# Patient Record
Sex: Male | Born: 1955 | Race: White | Hispanic: No | Marital: Married | Smoking: Never smoker
Health system: Southern US, Community
[De-identification: ages and names within clinical notes are randomized; demographics above are authoritative.]

## PROBLEM LIST (undated history)

## (undated) DIAGNOSIS — I499 Cardiac arrhythmia, unspecified: Secondary | ICD-10-CM

## (undated) DIAGNOSIS — E669 Obesity, unspecified: Secondary | ICD-10-CM

## (undated) DIAGNOSIS — I5022 Chronic systolic (congestive) heart failure: Secondary | ICD-10-CM

## (undated) DIAGNOSIS — Z86718 Personal history of other venous thrombosis and embolism: Secondary | ICD-10-CM

## (undated) DIAGNOSIS — E538 Deficiency of other specified B group vitamins: Secondary | ICD-10-CM

## (undated) DIAGNOSIS — I480 Paroxysmal atrial fibrillation: Secondary | ICD-10-CM

## (undated) DIAGNOSIS — T8859XA Other complications of anesthesia, initial encounter: Secondary | ICD-10-CM

## (undated) DIAGNOSIS — I10 Essential (primary) hypertension: Secondary | ICD-10-CM

## (undated) DIAGNOSIS — G4733 Obstructive sleep apnea (adult) (pediatric): Secondary | ICD-10-CM

## (undated) DIAGNOSIS — Z9289 Personal history of other medical treatment: Secondary | ICD-10-CM

## (undated) DIAGNOSIS — M1990 Unspecified osteoarthritis, unspecified site: Secondary | ICD-10-CM

## (undated) DIAGNOSIS — E039 Hypothyroidism, unspecified: Secondary | ICD-10-CM

## (undated) DIAGNOSIS — R7303 Prediabetes: Secondary | ICD-10-CM

## (undated) DIAGNOSIS — G629 Polyneuropathy, unspecified: Secondary | ICD-10-CM

## (undated) HISTORY — DX: Obstructive sleep apnea (adult) (pediatric): G47.33

## (undated) HISTORY — DX: Personal history of other medical treatment: Z92.89

## (undated) HISTORY — DX: Personal history of other venous thrombosis and embolism: Z86.718

## (undated) HISTORY — DX: Paroxysmal atrial fibrillation: I48.0

## (undated) HISTORY — DX: Unspecified osteoarthritis, unspecified site: M19.90

## (undated) HISTORY — DX: Essential (primary) hypertension: I10

## (undated) HISTORY — DX: Obesity, unspecified: E66.9

## (undated) HISTORY — PX: GANGLION CYST EXCISION: SHX1691

## (undated) HISTORY — PX: BARIATRIC SURGERY: SHX1103

## (undated) HISTORY — PX: TONSILLECTOMY: SUR1361

## (undated) HISTORY — DX: Deficiency of other specified B group vitamins: E53.8

## (undated) HISTORY — DX: Chronic systolic (congestive) heart failure: I50.22

---

## 1980-10-11 HISTORY — PX: CHOLECYSTECTOMY: SHX55

## 1991-10-12 HISTORY — PX: ARTHROSCOPIC REPAIR ACL: SUR80

## 2002-10-11 DIAGNOSIS — Z86718 Personal history of other venous thrombosis and embolism: Secondary | ICD-10-CM

## 2002-10-11 HISTORY — DX: Personal history of other venous thrombosis and embolism: Z86.718

## 2002-10-11 HISTORY — PX: BARIATRIC SURGERY: SHX1103

## 2009-10-11 HISTORY — PX: MENISCUS DEBRIDEMENT: SHX5178

## 2012-05-30 ENCOUNTER — Inpatient Hospital Stay: Payer: Self-pay | Admitting: Student

## 2012-05-30 LAB — URINALYSIS, COMPLETE
Bacteria: NONE SEEN
Bilirubin,UR: NEGATIVE
Blood: NEGATIVE
Glucose,UR: NEGATIVE mg/dL (ref 0–75)
Ketone: NEGATIVE
Ph: 5 (ref 4.5–8.0)
Specific Gravity: 1.015 (ref 1.003–1.030)
Squamous Epithelial: <1

## 2012-05-30 LAB — COMPREHENSIVE METABOLIC PANEL
Albumin: 4 g/dL (ref 3.4–5.0)
Anion Gap: 5 — ABNORMAL LOW (ref 7–16)
Bilirubin,Total: 0.3 mg/dL (ref 0.2–1.0)
EGFR (Non-African Amer.): >60
Glucose: 106 mg/dL — ABNORMAL HIGH (ref 65–99)
Osmolality: 285 (ref 275–301)
Potassium: 4.1 mmol/L (ref 3.5–5.1)
Sodium: 141 mmol/L (ref 136–145)
Total Protein: 8.2 g/dL (ref 6.4–8.2)

## 2012-05-30 LAB — CBC
HCT: 49.3 % (ref 40.0–52.0)
MCHC: 33.6 g/dL (ref 32.0–36.0)
MCV: 92 fL (ref 80–100)
Platelet: 171 10*3/uL (ref 150–440)
RBC: 5.38 10*6/uL (ref 4.40–5.90)
RDW: 14 % (ref 11.5–14.5)
WBC: 9.9 10*3/uL (ref 3.8–10.6)

## 2012-05-30 LAB — TROPONIN I: Troponin-I: <0.02 ng/mL

## 2012-05-30 LAB — APTT: Activated PTT: 29.7 secs (ref 23.6–35.9)

## 2012-05-30 LAB — TSH: Thyroid Stimulating Horm: 1.72 u[IU]/mL

## 2012-05-30 LAB — PROTIME-INR
INR: 1.1
Prothrombin Time: 14.1 secs (ref 11.5–14.7)

## 2012-05-31 DIAGNOSIS — R079 Chest pain, unspecified: Secondary | ICD-10-CM

## 2012-05-31 DIAGNOSIS — R002 Palpitations: Secondary | ICD-10-CM

## 2012-05-31 LAB — CK-MB
CK-MB: 1.1 ng/mL (ref 0.5–3.6)
CK-MB: 1.1 ng/mL (ref 0.5–3.6)

## 2012-05-31 LAB — BASIC METABOLIC PANEL
Anion Gap: 9 (ref 7–16)
BUN: 19 mg/dL — ABNORMAL HIGH (ref 7–18)
Chloride: 107 mmol/L (ref 98–107)
Creatinine: 1.07 mg/dL (ref 0.60–1.30)
EGFR (African American): >60
EGFR (Non-African Amer.): >60
Glucose: 98 mg/dL (ref 65–99)
Sodium: 143 mmol/L (ref 136–145)

## 2012-05-31 LAB — CBC WITH DIFFERENTIAL/PLATELET
Basophil %: 0.4 %
Eosinophil #: 0.2 10*3/uL (ref 0.0–0.7)
Eosinophil %: 1.9 %
HCT: 44.3 % (ref 40.0–52.0)
HGB: 15 g/dL (ref 13.0–18.0)
Lymphocyte %: 29.6 %
MCHC: 33.8 g/dL (ref 32.0–36.0)
MCV: 92 fL (ref 80–100)
Neutrophil #: 4.8 10*3/uL (ref 1.4–6.5)
Neutrophil %: 57 %
RBC: 4.83 10*6/uL (ref 4.40–5.90)
WBC: 8.5 10*3/uL (ref 3.8–10.6)

## 2012-05-31 LAB — TROPONIN I
Troponin-I: <0.02 ng/mL
Troponin-I: <0.02 ng/mL

## 2012-06-14 ENCOUNTER — Encounter: Payer: Self-pay | Admitting: Cardiovascular Disease

## 2012-06-19 ENCOUNTER — Ambulatory Visit (INDEPENDENT_AMBULATORY_CARE_PROVIDER_SITE_OTHER): Payer: 59 | Admitting: Cardiovascular Disease

## 2012-06-19 ENCOUNTER — Encounter: Payer: Self-pay | Admitting: Cardiovascular Disease

## 2012-06-19 VITALS — BP 172/82 | HR 66 | Ht 75.0 in | Wt 367.5 lb

## 2012-06-19 DIAGNOSIS — R0602 Shortness of breath: Secondary | ICD-10-CM

## 2012-06-19 DIAGNOSIS — I4891 Unspecified atrial fibrillation: Secondary | ICD-10-CM

## 2012-06-19 DIAGNOSIS — R002 Palpitations: Secondary | ICD-10-CM | POA: Insufficient documentation

## 2012-06-19 DIAGNOSIS — I10 Essential (primary) hypertension: Secondary | ICD-10-CM | POA: Insufficient documentation

## 2012-06-19 DIAGNOSIS — R0601 Orthopnea: Secondary | ICD-10-CM | POA: Insufficient documentation

## 2012-06-19 DIAGNOSIS — I482 Chronic atrial fibrillation, unspecified: Secondary | ICD-10-CM | POA: Insufficient documentation

## 2012-06-19 DIAGNOSIS — R079 Chest pain, unspecified: Secondary | ICD-10-CM

## 2012-06-19 MED ORDER — DILTIAZEM HCL ER 240 MG PO CP24: 240.0000 mg | ORAL_CAPSULE | Freq: Every day | ORAL | Status: DC

## 2012-06-19 NOTE — Assessment & Plan Note (Signed)
He has symptoms of shortness of breath. Uncertain if this is from arrhythmia or other etiology. The monitor should help determine this.

## 2012-06-19 NOTE — Patient Instructions (Addendum)
Monitor blood pressure  We will order a 30 day monitor for atrial fibrillation  Do a trial off xarelto for a few days  Please call us if you have new issues that need to be addressed before your next appt.  Your physician wants you to follow-up in: 6 weeks  You will receive a reminder letter in the mail two months in advance. If you don't receive a letter, please call our office to schedule the follow-up appointment.

## 2012-06-19 NOTE — Assessment & Plan Note (Signed)
Symptoms concerning for paroxysmal atrial fibrillation. He has irregular measurements on his blood pressure cuff with feelings of malaise and palpitations. We have ordered a 30 day monitor to help [place] with medication titration for his atrial fibrillation. This should arrive in the mail in one to 2 days.

## 2012-06-19 NOTE — Progress Notes (Signed)
Patient ID: Johnathan [name], male    DOB: 26-Apr-1956, 56 y.o.   MRN: [id number]  HPI Comments: [name] is a pleasant 56 year old gentleman with obesity, chronic joint pain on long-term low-dose narcotics, hypertension, history of bariatric surgery who is brought in by EMS to the hospital 05/31/2012 with left-sided chest pain and palpitations. He was given adenosine in the emergency room that showed atrial fibrillation. He received Cardizem and started on diltiazem infusion and converted to normal sinus rhythm overnight. He was started on anticoagulation, continued on long-acting Cardizem and was discharged after echocardiogram showed normal LV function.  Echocardiogram showed normal LV function with ejection fraction 50-55%, normal right ventricular systolic pressure, normal left atrium Chest x-ray was normal No significant decline in his cardiac enzymes, TSH 1.7  Since his discharge, he has vague symptoms of malaise, occasional palpitations, but he cleared his received abnormal readings on his blood pressure cuff concerning for arrhythmia. His wife reports that he looked gray at times. He has significant heat intolerance since he was started on these medications and finds himself sweating when it is not hot.  Blood pressure at home has been labile, typically in the 130-140 systolic range.   EKG today shows normal sinus rhythm with rate 66 beats per minute, no significant ST or T wave changes   Outpatient Encounter Prescriptions as of 06/19/2012  Medication Sig Dispense Refill  . aspirin 81 MG tablet Take 81 mg by mouth daily.      . B Complex Vitamins (B COMPLEX 50 PO) Take by mouth daily.      Marland Kitchen buPROPion (WELLBUTRIN XL) 300 MG 24 hr tablet Take 300 mg by mouth daily.      . Cholecalciferol (VITAMIN D3) 2000 UNITS TABS Take by mouth daily.      Marland Kitchen diltiazem (DILACOR XR) 240 MG 24 hr capsule Take 1 capsule (240 mg total) by mouth daily.  90 capsule  3  . FERROUS GLUCONATE PO Take by mouth  daily.      Marland Kitchen GLUCOSAMINE HCL-MSM PO Take by mouth daily.      Marland Kitchen HYDROcodone-acetaminophen (VICODIN) 5-500 MG per tablet Take 1 tablet by mouth as needed.      Marland Kitchen lisinopril-hydrochlorothiazide (PRINZIDE,ZESTORETIC) 20-12.5 MG per tablet Take 1 tablet by mouth daily.      . meloxicam (MOBIC) 15 MG tablet Take 15 mg by mouth daily.      . Multiple Vitamin (MULTIVITAMIN) tablet Take 1 tablet by mouth daily.      . Rivaroxaban (XARELTO) 20 MG TABS Take 20 mg by mouth daily.      . SAW PALMETTO-PHYTOSTEROLS PO Take 320 mg by mouth daily.      . vitamin B-12 (CYANOCOBALAMIN) 500 MCG tablet Take 500 mcg by mouth daily.         Review of Systems  Constitutional: Positive for fatigue.       Fells hot and sweaty  HENT: Negative.   Eyes: Negative.   Respiratory: Negative.   Cardiovascular: Positive for palpitations.  Gastrointestinal: Negative.   Musculoskeletal: Negative.   Skin: Negative.   Neurological: Positive for weakness.  Hematological: Negative.   Psychiatric/Behavioral: Negative.   All other systems reviewed and are negative.    BP 172/82  Pulse 66  Ht 6\' 3"  (1.905 m)  Wt 367 lb 8 oz (166.697 kg)  BMI 45.93 kg/m2  Physical Exam  Nursing note and vitals reviewed. Constitutional: He is oriented to person, place, and time. He appears well-developed and well-nourished.  obese  HENT:  Head: Normocephalic.  Nose: Nose normal.  Mouth/Throat: Oropharynx is clear and moist.  Eyes: Conjunctivae are normal. Pupils are equal, round, and reactive to light.  Neck: Normal range of motion. Neck supple. No JVD present.  Cardiovascular: Normal rate, regular rhythm, S1 normal, S2 normal, normal heart sounds and intact distal pulses.  Exam reveals no gallop and no friction rub.   No murmur heard. Pulmonary/Chest: Effort normal and breath sounds normal. No respiratory distress. He has no wheezes. He has no rales. He exhibits no tenderness.  Abdominal: Soft. Bowel sounds are normal. He  exhibits no distension. There is no tenderness.  Musculoskeletal: Normal range of motion. He exhibits no edema and no tenderness.  Lymphadenopathy:    He has no cervical adenopathy.  Neurological: He is alert and oriented to person, place, and time. Coordination normal.  Skin: Skin is warm and dry. No rash noted. No erythema.  Psychiatric: He has a normal mood and affect. His behavior is normal. Judgment and thought content normal.           Assessment and Plan        The

## 2012-06-19 NOTE — Assessment & Plan Note (Signed)
Uncertain if palpitation symptoms are from atrial fibrillation or ectopy. Monitor pending.

## 2012-06-19 NOTE — Assessment & Plan Note (Signed)
Blood pressure is elevated. We will ask them to closely monitor his blood pressure at home and write this down for [place]

## 2012-06-26 DIAGNOSIS — I4949 Other premature depolarization: Secondary | ICD-10-CM

## 2012-06-26 DIAGNOSIS — I4891 Unspecified atrial fibrillation: Secondary | ICD-10-CM

## 2012-06-27 ENCOUNTER — Telehealth: Payer: Self-pay | Admitting: Cardiovascular Disease

## 2012-06-27 NOTE — Telephone Encounter (Signed)
See below and advise

## 2012-06-27 NOTE — Telephone Encounter (Signed)
Pt calling states that he was told to stop taking the xarelto and the symptoms that he was having have stopped. Needs to know if he needs to double his ASA or is there something else he needs to take

## 2012-06-27 NOTE — Telephone Encounter (Signed)
Can we look on line for the results of his 30 day monitor. He should be wearing this now. We need to look for atrial fibrillation. If he is maintaining normal sinus rhythm, perhaps we can hold the anticoagulation.

## 2012-06-28 NOTE — Telephone Encounter (Signed)
He has only been wearing x 3 days, per pt. It looks like he had 1 day of atrial fib 9/17. On your desk.

## 2012-06-29 NOTE — Telephone Encounter (Signed)
We will need to watch his rhythm closely. Hold anticoagulation for now Would set up followup after his 30 day monitor

## 2012-06-30 NOTE — Telephone Encounter (Signed)
Pt called back.  Understanding verb

## 2012-06-30 NOTE — Telephone Encounter (Signed)
lmtcb

## 2012-07-10 ENCOUNTER — Encounter: Payer: Self-pay | Admitting: Internal Medicine

## 2012-07-10 ENCOUNTER — Ambulatory Visit (INDEPENDENT_AMBULATORY_CARE_PROVIDER_SITE_OTHER): Payer: 59 | Admitting: Internal Medicine

## 2012-07-10 VITALS — BP 146/74 | HR 69 | Temp 99.1°F | Ht 72.25 in | Wt 364.0 lb

## 2012-07-10 DIAGNOSIS — R5383 Other fatigue: Secondary | ICD-10-CM | POA: Insufficient documentation

## 2012-07-10 DIAGNOSIS — Z23 Encounter for immunization: Secondary | ICD-10-CM

## 2012-07-10 DIAGNOSIS — R5381 Other malaise: Secondary | ICD-10-CM | POA: Insufficient documentation

## 2012-07-10 DIAGNOSIS — Z1211 Encounter for screening for malignant neoplasm of colon: Secondary | ICD-10-CM

## 2012-07-10 DIAGNOSIS — M2550 Pain in unspecified joint: Secondary | ICD-10-CM

## 2012-07-10 DIAGNOSIS — M159 Polyosteoarthritis, unspecified: Secondary | ICD-10-CM | POA: Insufficient documentation

## 2012-07-10 MED ORDER — BUPROPION HCL ER (XL) 300 MG PO TB24: 300.0000 mg | ORAL_TABLET | Freq: Every day | ORAL | Status: DC

## 2012-07-10 MED ORDER — HYDROCODONE-ACETAMINOPHEN 5-500 MG PO TABS: 1.0000 | ORAL_TABLET | ORAL | Status: DC | PRN

## 2012-07-10 NOTE — Patient Instructions (Addendum)
Return for fasting labs, including PSA and B12, iron, K, etc.  Follow up with me in 1 month  Referral to [facility] underway for bilateral shoulder pain and knee pain  Referral to [facility] for screening colonoscopy underway   This is  Dr. Norton Blizzard version of a  "Low GI"  Diet:  All of the foods can be found at grocery stores and in bulk at [employer].  The Atkins protein bars and shakes are available in more varieties at [employer], [employer] and [employer].     7 AM Breakfast:  Low carbohydrate Protein  Shakes (I recommend the EAS AdvantEdge "Carb Control" shakes  Or the low carb shakes by Atkins.   Both are available everywhere:  In  cases at [employer]Js  Or in 4 packs at grocery stores and pharmacies  2.5 carbs  (Alternative is  a toasted Arnold's Sandwhich Thin w/ peanut butter, a "Bagel Thin" with cream cheese and salmon) or  a scrambled egg burrito made with a low carb tortilla .  Avoid cereal and bananas, oatmeal too unless you are cooking the old fashioned kind that takes 30-40 minutes to prepare.  the rest is overly processed, has minimal fiber, and is loaded with carbohydrates!   10 AM: Protein bar by Atkins (the snack size, under 200 cal).  There are many varieties , available widely again or in bulk in limited varieties at BJs)  Other so called "protein bars" tend to be loaded with carbohydrates.  Remember, in food advertising, the word "energy" is synonymous for " carbohydrate."  Lunch: sandwich of [place], (or any lunchmeat, grilled meat or canned tuna), fresh avocado, mayonnaise  and cheese on a lower carbohydrate pita bread, flatbread, or tortilla . Ok to use regular mayonnaise. The bread is the only source or carbohydrate that can be decreased (Joseph's makes a pita bread and a flat bread that are 50 cal and 4 net carbs ; Toufayan makes a low carb flatbread that's 100 cal and 9 net carbs  and  Mission makes a low carb whole wheat tortilla  That is 210 cal and 6 net carbs)  3  PM:  Mid day :  Another protein bar,  Or a  cheese stick (100 cal, 0 carbs),  Or 1 ounce of  almonds, walnuts, pistachios, pecans, peanuts,  Macadamia nuts. Or a Dannon light n Fit greek yogurt, 80 cal 8 net carbs . Avoid "granola"; the dried cranberries and raisins are loaded with carbohydrates. Mixed nuts ok if no raisins or cranberries or dried fruit.      6 PM  Dinner:  "mean and green:"  Meat/chicken/fish or a high protein legume; , with a green salad, and a low GI  Veggie (broccoli, cauliflower, green beans, spinach, brussel sprouts. Lima beans) : Avoid "Low fat dressings, as well as Reyne Dumas and [address]! They are loaded with sugar! Instead use ranch, vinagrette,  Blue cheese, etc  9 PM snack : Breyer's "low carb" fudgsicle or  ice cream bar (Carb Smart line), or  Weight Watcher's ice cream bar , or another "no sugar added" ice cream;a serving of fresh berries/cherries with whipped cream (Avoid bananas, pineapple, grapes  and watermelon on a regular basis because they are high in sugar)   Remember that snack Substitutions should be less than 15 to 20 carbs  Per serving. Remember to subtract fiber grams and sugar alcohols to get the "net carbs."

## 2012-07-10 NOTE — Assessment & Plan Note (Signed)
His BMI is 49 despite going to gastric bypass surgery 10 years ago. I've given him a low glycemic index diet outlined. He may need to be referred back to a local bariatric Center for support.  His ability to exercise is hindered by his belt has multiple joint pains. He is exercising at the [facility] using water aerobics and water walking in the water. I've encouraged him to continue this a minimum of 5 days a week. I will see him back in one month. I will screen for diabetes.

## 2012-07-10 NOTE — Progress Notes (Signed)
Patient ID: Johnathan [name], male   DOB: 10/27/1955, 56 y.o.   MRN: [id number]  Patient Active Problem List  Diagnosis  . A-fib  . Palpitations  . Shortness of breath  . Essential hypertension, malignant  . Other malaise and fatigue  . Obesity, Class III, BMI 40-49.9 (morbid obesity)  . 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 Establish Care    HPI:   Johnathan [name] is a 55 y.o. male who presents as a new patient to establish primary care with the chief complaint of  Extremely low energy for the past 3 months  .  Patient is a 56 year old white male with history of morbid obesity status post gastric bypass surgery remotely with recent onset of atrial fibrillation who is here to establish primary care.  His symptom of extreme fatigue Started after starting medications diltiazem and xarelto.  He spoke with Dr. Orpah Clinton who has agreed with suspension of 0 also until his followup on October 18. He has been tested for sleep apnea but this occurred 10 Roux-en-Y gastric bypass  in 1998 in [place] for morbid obesity. His weight was 450 pounds and he drops to 325 eventually but has regained 40 pounds since then. He has disruptive sleep and averages a total of 5-6 hours per night. He is woken up by frequent urination. He has no trouble staying awake during the day. He does snore. 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 He previously saw a rheumatologist in [place] and was also referred to Dr. Kathi Ludwig by his former PCP. Rheumatoid arthritis and other autoimmune symptoms were reportedly ruled out. He has a history of failed steroid injections in both shoulders. He has had arthroscopic surgery in both knees. He had a meniscal tear in the left knee and had cartilage removed 2 years ago. His pain is controlled with 2-3 Vicodin daily which he has been taking for  the last 10 years. Does not have an orthopedist here in [place] 3) glucose intolerance patient states that for the last several months he has developed GI distress with carbohydrate including muffins. He reports no personal history of diabetes but is becoming increasingly intolerant of refined carbs .  He has recurrent dumping syndrome after now after drinking milk and eating things like blueberry muffins. G .  Attributes his wt gain to several life changes including relocating from [place] to [facility] to [facility]. 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     Past Medical History  Diagnosis Date  . Hypertension   . B12 deficiency   . Arthritis   . Obesity   . Arrhythmia     A-fib  . History of DVT (deep vein thrombosis)     s/p bariatric surgery    Past Surgical History  Procedure Date  . Bariatric surgery   . Arthroscopic repair acl     bilateral knee  . Cholecystectomy     History reviewed. No pertinent family history.  History   Social History  . Marital Status: Married    Spouse Name: N/A    Number of Children: N/A  . Years of Education: N/A   Occupational History  . Not on file.   Social History Main Topics  . Smoking status: Never Smoker   . Smokeless  tobacco: Not on file  . Alcohol Use: No  . Drug Use: No  . Sexually Active:    Other Topics Concern  . Not on file   Social History Narrative  . No narrative on file     No Known Allergies  Review of Systems:   The remainder of the review of systems was negative except those addressed in the HPI.       Objective:  BP 146/74  Pulse 69  Temp 99.1 F (37.3 C) (Oral)  Ht 6' 0.25" (1.835 m)  Wt 364 lb (165.109 kg)  BMI 49.03 kg/m2  SpO2 95%  General appearance: alert, cooperative and appears stated age Ears: normal TM's and external ear canals both ears Throat: lips, mucosa, and tongue normal; teeth and gums normal Neck: no  adenopathy, no carotid bruit, supple, symmetrical, trachea midline and thyroid not enlarged, symmetric, no tenderness/mass/nodules Back: symmetric, no curvature. ROM normal. No CVA tenderness. Lungs: clear to auscultation bilaterally Heart: regular rate and rhythm, S1, S2 normal, no murmur, click, rub or gallop Abdomen: soft, non-tender; bowel sounds normal; no masses,  no organomegaly Pulses: 2+ and symmetric Skin: Skin color, texture, turgor normal. No rashes or lesions Lymph nodes: Cervical, supraclavicular, and axillary nodes normal.  Assessment and Plan:  Other malaise and fatigue The differential diagnosis is broad and not likely due to diltiazem. My suspicion is that he has either low testosterone, sleep apnea, diabetes or some combination of the 3. Hypothyroidism was ruled out with a TSH 3 weeks ago during ER visit for atrial fibrillation. He will return for fasting labs and testosterone level. If his levels are normal I will recommend strongly that he had a repeat sleep study.  Obesity, Class III, BMI 40-49.9 (morbid obesity) His BMI is 49 despite going to gastric bypass surgery 10 years ago. I've given him a low glycemic index diet outlined. He may need to be referred back to a local bariatric Center for support.  His ability to exercise is hindered by his belt has multiple joint pains. He is exercising at the [facility] using water aerobics and water walking in the water. I've encouraged him to continue this a minimum of 5 days a week. I will see him back in one month. I will screen for diabetes.  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 He has seen rheumatologists both in [place] in [place]. His arthritis is complicated by morbid obesity which is aggravating his knee pain. I will refill his Vicodin on a regular basis here. I have referred him to [facility] for evaluation  of both shoulders and knees anticipation of need for joint replacement. He will likely need to lose 10% of his body weight before the surgery would be recommended by an orthopedist.   Updated Medication List Outpatient Encounter Prescriptions as of 07/10/2012  Medication Sig Dispense Refill  . aspirin 81 MG tablet Take 81 mg by mouth daily.      . B Complex Vitamins (B COMPLEX 50 PO) Take by mouth daily.      Marland Kitchen buPROPion (WELLBUTRIN XL) 300 MG 24 hr tablet Take 1 tablet (300 mg total) by mouth daily.  90 tablet  3  . Cholecalciferol (VITAMIN D3) 2000 UNITS TABS Take by mouth daily.      Marland Kitchen diltiazem (DILACOR XR) 240 MG 24 hr capsule Take 1 capsule (240 mg total) by mouth daily.  90 capsule  3  . FERROUS GLUCONATE PO Take by mouth daily.      Marland Kitchen GLUCOSAMINE HCL-MSM PO Take by mouth daily.      Marland Kitchen HYDROcodone-acetaminophen (VICODIN) 5-500 MG per tablet Take 1 tablet by mouth as needed.  90 tablet  3  . lisinopril-hydrochlorothiazide (PRINZIDE,ZESTORETIC) 20-12.5 MG per tablet Take 1 tablet by mouth daily.      . meloxicam (MOBIC) 15 MG tablet Take 15 mg by mouth daily.      . Multiple Vitamin (MULTIVITAMIN) tablet Take 1 tablet by mouth daily.      . SAW PALMETTO-PHYTOSTEROLS PO Take 320 mg by mouth daily.      . vitamin B-12 (CYANOCOBALAMIN) 500 MCG tablet Take 500 mcg by mouth daily.      . vitamin E 400 UNIT capsule Take 400 Units by mouth daily.      Marland Kitchen DISCONTD: buPROPion (WELLBUTRIN XL) 300 MG 24 hr tablet Take 300 mg by mouth daily.      Marland Kitchen DISCONTD: HYDROcodone-acetaminophen (VICODIN) 5-500 MG per tablet Take 1 tablet by mouth as needed.      Marland Kitchen DISCONTD: Rivaroxaban (XARELTO) 20 MG TABS Take 20 mg by mouth daily.         Orders Placed This Encounter  Procedures  . Tdap vaccine greater than or equal to 7yo IM  . Ambulatory referral to Orthopedic Surgery  . Ambulatory referral to Gastroenterology    No Follow-up on file.

## 2012-07-10 NOTE — Assessment & Plan Note (Addendum)
The differential diagnosis is broad and not likely due to diltiazem. My suspicion is that he has either low testosterone, sleep apnea, diabetes or some combination of the 3. Hypothyroidism was ruled out with a TSH 3 weeks ago during ER visit for atrial fibrillation. He will return for fasting labs and testosterone level. If his levels are normal I will recommend strongly that he had a repeat sleep study.

## 2012-07-10 NOTE — Assessment & Plan Note (Signed)
He has reportedly had a rheumatologic evaluation to rule out autoimmune syndromes. He has seen rheumatologists both in [place] in [place]. His arthritis is complicated by morbid obesity which is aggravating his knee pain. I will refill his Vicodin on a regular basis here. I have referred him to [facility] for evaluation of both shoulders and knees anticipation of need for joint replacement. He will likely need to lose 10% of his body weight before the surgery would be recommended by an orthopedist.

## 2012-07-11 ENCOUNTER — Telehealth: Payer: Self-pay | Admitting: Internal Medicine

## 2012-07-11 ENCOUNTER — Encounter: Payer: Self-pay | Admitting: Internal Medicine

## 2012-07-11 DIAGNOSIS — Z9884 Bariatric surgery status: Secondary | ICD-10-CM

## 2012-07-11 NOTE — Telephone Encounter (Signed)
Pt called  He needs order to get his labs done at [facility] Please fax orders to [facility] on [address] [place] Please advise when these have been faxed in

## 2012-07-12 NOTE — Telephone Encounter (Signed)
Patient needs written order for labs to be drawn at [facility].

## 2012-07-12 NOTE — Telephone Encounter (Signed)
I have used the interface to send the [facility] orders to [facility].  This is how we handle [facility] orders,  Printing takes 1 page per order.

## 2012-07-13 NOTE — Telephone Encounter (Signed)
Patient notified and will make lab appt when convenient.

## 2012-07-14 ENCOUNTER — Other Ambulatory Visit: Payer: 59

## 2012-07-14 DIAGNOSIS — Z9884 Bariatric surgery status: Secondary | ICD-10-CM

## 2012-07-15 LAB — CBC WITH DIFFERENTIAL/PLATELET
Basophils Absolute: 0 10*3/uL (ref 0.0–0.2)
Basos: 0 % (ref 0–3)
HCT: 44.4 % (ref 37.5–51.0)
Hemoglobin: 15 g/dL (ref 12.6–17.7)
Lymphocytes Absolute: 1.8 10*3/uL (ref 0.7–3.1)
MCH: 30.4 pg (ref 26.6–33.0)
MCHC: 33.8 g/dL (ref 31.5–35.7)
Monocytes: 12 % (ref 4–12)
Neutrophils Absolute: 3.4 10*3/uL (ref 1.4–7.0)
RBC: 4.93 x10E6/uL (ref 4.14–5.80)

## 2012-07-15 LAB — LIPID PANEL
Cholesterol, Total: 133 mg/dL (ref 100–199)
HDL: 39 mg/dL — ABNORMAL LOW (ref >39)
LDL Calculated: 79 mg/dL (ref 0–99)
VLDL Cholesterol Cal: 15 mg/dL (ref 5–40)

## 2012-07-15 LAB — BASIC METABOLIC PANEL
BUN/Creatinine Ratio: 20 (ref 9–20)
Calcium: 8.4 mg/dL — ABNORMAL LOW (ref 8.7–10.2)
Creatinine, Ser: 0.91 mg/dL (ref 0.76–1.27)
GFR calc non Af Amer: 94 mL/min/{1.73_m2} (ref >59)
Sodium: 141 mmol/L (ref 134–144)

## 2012-07-15 LAB — HEPATIC FUNCTION PANEL
ALT: 24 IU/L (ref 0–44)
AST: 21 IU/L (ref 0–40)
Albumin: 4.1 g/dL (ref 3.5–5.5)
Alkaline Phosphatase: 102 IU/L (ref 44–102)

## 2012-07-15 LAB — VITAMIN K1, SERUM

## 2012-07-15 LAB — IRON AND TIBC: TIBC: 294 ug/dL (ref 250–450)

## 2012-07-15 LAB — FERRITIN: Ferritin: 59 ng/mL (ref 30–400)

## 2012-07-22 ENCOUNTER — Telehealth: Payer: Self-pay | Admitting: Internal Medicine

## 2012-07-22 NOTE — Telephone Encounter (Signed)
His recent labs looked okay. The vitamin K was not run for some reason. This was this was ordered because he is a gastric bypass patient. He is scheduled for followup in one month, please confirm.

## 2012-07-28 ENCOUNTER — Other Ambulatory Visit: Payer: Self-pay

## 2012-07-28 ENCOUNTER — Ambulatory Visit: Payer: 59 | Admitting: Cardiovascular Disease

## 2012-07-28 DIAGNOSIS — I4891 Unspecified atrial fibrillation: Secondary | ICD-10-CM

## 2012-07-28 DIAGNOSIS — I493 Ventricular premature depolarization: Secondary | ICD-10-CM

## 2012-07-31 ENCOUNTER — Other Ambulatory Visit: Payer: Self-pay | Admitting: Cardiovascular Disease

## 2012-07-31 DIAGNOSIS — I493 Ventricular premature depolarization: Secondary | ICD-10-CM

## 2012-07-31 DIAGNOSIS — I4891 Unspecified atrial fibrillation: Secondary | ICD-10-CM

## 2012-08-09 ENCOUNTER — Ambulatory Visit (INDEPENDENT_AMBULATORY_CARE_PROVIDER_SITE_OTHER): Payer: 59 | Admitting: Internal Medicine

## 2012-08-09 ENCOUNTER — Encounter: Payer: Self-pay | Admitting: Internal Medicine

## 2012-08-09 VITALS — BP 138/82 | HR 53 | Temp 98.4°F | Ht 75.0 in | Wt 366.5 lb

## 2012-08-09 DIAGNOSIS — M159 Polyosteoarthritis, unspecified: Secondary | ICD-10-CM

## 2012-08-09 MED ORDER — LOSARTAN POTASSIUM-HCTZ 50-12.5 MG PO TABS: 1.0000 | ORAL_TABLET | Freq: Every day | ORAL | Status: DC

## 2012-08-09 NOTE — Progress Notes (Signed)
Patient ID: Johnathan [name], male   DOB: 1956/08/13, 56 y.o.   MRN: [id number]  Patient Active Problem List  Diagnosis  . A-fib  . Palpitations  . Shortness of breath  . Essential hypertension, malignant  . Other malaise and fatigue  . Obesity, Class III, BMI 40-49.9 (morbid obesity)  . 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   HPI:   Johnathan [name] a 56 y.o. male who presents  Past Medical History  Diagnosis Date  . Hypertension   . B12 deficiency   . Arthritis   . Obesity   . Arrhythmia     A-fib  . History of DVT (deep vein thrombosis)     s/p bariatric surgery    Past Surgical History  Procedure Date  . Bariatric surgery 2004    Rou en Y   . Arthroscopic repair acl 1993    bilateral knee  . Cholecystectomy 1982  . Meniscus debridement 2011    Left knee         The following portions of the patient's history were reviewed and updated as appropriate: Allergies, current medications, and problem list.    Review of Systems:   12 Pt  review of systems was negative except those addressed in the HPI,     History   Social History  . Marital Status: Married    Spouse Name: N/A    Number of Children: N/A  . Years of Education: N/A   Occupational History  . Not on file.   Social History Main Topics  . Smoking status: Never Smoker   . Smokeless tobacco: Not on file  . Alcohol Use: No  . Drug Use: No  . Sexually Active:    Other Topics Concern  . Not on file   Social History Narrative  . No narrative on file    Objective:  BP 138/82  Pulse 53  Temp 98.4 F (36.9 C) (Oral)  Ht 6\' 3"  (1.905 m)  Wt 366 lb 8 oz (166.243 kg)  BMI 45.81 kg/m2  SpO2 96%  General appearance: alert, cooperative and appears stated age Ears: normal TM's and external ear canals both ears Throat: [name], mucosa, and tongue normal; teeth and gums normal Neck: no adenopathy,  no carotid bruit, supple, symmetrical, trachea midline and thyroid not enlarged, symmetric, no tenderness/mass/nodules Back: symmetric, no curvature. ROM normal. No CVA tenderness. 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 No changes to regimen today.  Obesity, Class III, BMI 40-49.9 (morbid obesity) He was again encouraged to start a water aerobics program and a low glycemic index diet.  Handout was given   Updated Medication List Outpatient Encounter Prescriptions as of 08/09/2012  Medication Sig Dispense Refill  . aspirin 81 MG tablet Take 81 mg by mouth daily.      . B Complex Vitamins (B COMPLEX 50 PO) Take by mouth daily.      Marland Kitchen buPROPion (WELLBUTRIN XL) 300 MG 24 hr tablet Take 1 tablet (300 mg total)  by mouth daily.  90 tablet  3  . Cholecalciferol (VITAMIN D3) 2000 UNITS TABS Take by mouth daily.      Marland Kitchen diltiazem (DILACOR XR) 240 MG 24 hr capsule Take 1 capsule (240 mg total) by mouth daily.  90 capsule  3  . FERROUS GLUCONATE PO Take by mouth daily.      Marland Kitchen GLUCOSAMINE HCL-MSM PO Take by mouth daily.      Marland Kitchen HYDROcodone-acetaminophen (VICODIN) 5-500 MG per tablet Take 1 tablet by mouth as needed.  90 tablet  3  . meloxicam (MOBIC) 15 MG tablet Take 15 mg by mouth daily.      . Multiple Vitamin (MULTIVITAMIN) tablet Take 1 tablet by mouth daily.      . SAW PALMETTO-PHYTOSTEROLS PO Take 320 mg by mouth daily.      . vitamin B-12 (CYANOCOBALAMIN) 500 MCG tablet Take 500 mcg by mouth daily.      . vitamin E 400 UNIT capsule Take 400 Units by mouth  daily.      Marland Kitchen DISCONTD: lisinopril-hydrochlorothiazide (PRINZIDE,ZESTORETIC) 20-12.5 MG per tablet Take 1 tablet by mouth daily.      Marland Kitchen losartan-hydrochlorothiazide (HYZAAR) 50-12.5 MG per tablet Take 1 tablet by mouth daily.  90 tablet  3     No orders of the defined types were placed in this encounter.    No Follow-up on file.

## 2012-08-10 ENCOUNTER — Encounter: Payer: Self-pay | Admitting: Internal Medicine

## 2012-08-10 NOTE — Assessment & Plan Note (Signed)
His pain is currently manageable with current regimen. No changes to her regimen today.

## 2012-08-10 NOTE — Assessment & Plan Note (Addendum)
He was again encouraged to start a water aerobics program and a low glycemic index diet.  Handout was given

## 2012-08-17 ENCOUNTER — Encounter: Payer: Self-pay | Admitting: Cardiovascular Disease

## 2012-08-17 ENCOUNTER — Ambulatory Visit (INDEPENDENT_AMBULATORY_CARE_PROVIDER_SITE_OTHER): Payer: 59 | Admitting: Cardiovascular Disease

## 2012-08-17 VITALS — BP 156/80 | HR 97 | Ht 75.0 in | Wt 371.5 lb

## 2012-08-17 DIAGNOSIS — I4891 Unspecified atrial fibrillation: Secondary | ICD-10-CM

## 2012-08-17 DIAGNOSIS — R0602 Shortness of breath: Secondary | ICD-10-CM

## 2012-08-17 DIAGNOSIS — I10 Essential (primary) hypertension: Secondary | ICD-10-CM

## 2012-08-17 MED ORDER — PROPRANOLOL HCL 10 MG PO TABS: 10.0000 mg | ORAL_TABLET | Freq: Three times a day (TID) | ORAL | Status: DC | PRN

## 2012-08-17 NOTE — Assessment & Plan Note (Signed)
History of gastric bypass. We have encouraged continued exercise, careful diet management in an effort to lose weight.

## 2012-08-17 NOTE — Assessment & Plan Note (Signed)
Blood pressure elevated today. He does report improved blood pressure at home. We have suggested he closely monitor this and call our office if it continues to run high.

## 2012-08-17 NOTE — Progress Notes (Signed)
Patient ID: Johnathan [name], male    DOB: May 12, 1956, 56 y.o.   MRN: [id number]  HPI Comments: [name] is a pleasant 56 year old gentleman with obesity, chronic joint pain of the shoulders and knees on long-term low-dose narcotics, hypertension, history of bariatric surgery who was brought in by EMS to the hospital 05/31/2012 with left-sided chest pain and palpitations. He was given adenosine in the emergency room that showed atrial fibrillation. He received Cardizem and started on diltiazem infusion and converted to normal sinus rhythm overnight. He was started on anticoagulation, continued on long-acting Cardizem and was discharged after echocardiogram showed normal LV function.  Echocardiogram showed normal LV function with ejection fraction 50-55%, normal right ventricular systolic pressure, normal left atrium Chest x-ray was normal No significant decline in his cardiac enzymes, TSH 1.7  Overall he reports he is doing well apart from significant shoulder and knee pain. He denies any sleep apnea. He denies any tachycardia or palpitations concerning for recurrent atrial fibrillation. No further chest pain.  Event monitor was performed September 16 2 07/25/2012 that showed normal sinus rhythm with PVCs, no significant atrial fibrillation Blood pressure at home has been labile, typically in the 130-140 systolic range.   EKG today shows normal sinus rhythm with rate 97 beats per minute, no significant ST or T wave changes   Outpatient Encounter Prescriptions as of 08/17/2012  Medication Sig Dispense Refill  . aspirin 81 MG tablet Take 81 mg by mouth daily.      . B Complex Vitamins (B COMPLEX 50 PO) Take by mouth daily.      Marland Kitchen buPROPion (WELLBUTRIN XL) 300 MG 24 hr tablet Take 1 tablet (300 mg total) by mouth daily.  90 tablet  3  . Cholecalciferol (VITAMIN D3) 2000 UNITS TABS Take by mouth daily.      Marland Kitchen diltiazem (DILACOR XR) 240 MG 24 hr capsule Take 1 capsule (240 mg total) by mouth  daily.  90 capsule  3  . FERROUS GLUCONATE PO Take by mouth daily.      Marland Kitchen GLUCOSAMINE HCL-MSM PO Take by mouth daily.      Marland Kitchen HYDROcodone-acetaminophen (VICODIN) 5-500 MG per tablet Take 1 tablet by mouth as needed.  90 tablet  3  . losartan-hydrochlorothiazide (HYZAAR) 50-12.5 MG per tablet Take 1 tablet by mouth daily.  90 tablet  3  . meloxicam (MOBIC) 15 MG tablet Take 15 mg by mouth daily.      . Multiple Vitamin (MULTIVITAMIN) tablet Take 1 tablet by mouth daily.      . SAW PALMETTO-PHYTOSTEROLS PO Take 320 mg by mouth daily.      . vitamin B-12 (CYANOCOBALAMIN) 500 MCG tablet Take 500 mcg by mouth daily.      . vitamin E 400 UNIT capsule Take 400 Units by mouth daily.         Review of Systems  Constitutional:       Fells hot and sweaty  HENT: Negative.   Eyes: Negative.   Respiratory: Negative.   Gastrointestinal: Negative.   Musculoskeletal: Positive for joint swelling, arthralgias and gait problem.  Skin: Negative.   Hematological: Negative.   Psychiatric/Behavioral: Negative.   All other systems reviewed and are negative.    BP 156/80  Pulse 97  Ht 6\' 3"  (1.905 m)  Wt 371 lb 8 oz (168.511 kg)  BMI 46.43 kg/m2  Physical Exam  Nursing note and vitals reviewed. Constitutional: He is oriented to person, place, and time. He appears well-developed and well-nourished.  obese  HENT:  Head: Normocephalic.  Nose: Nose normal.  Mouth/Throat: Oropharynx is clear and moist.  Eyes: Conjunctivae normal are normal. Pupils are equal, round, and reactive to light.  Neck: Normal range of motion. Neck supple. No JVD present.  Cardiovascular: Normal rate, regular rhythm, S1 normal, S2 normal, normal heart sounds and intact distal pulses.  Exam reveals no gallop and no friction rub.   No murmur heard. Pulmonary/Chest: Effort normal and breath sounds normal. No respiratory distress. He has no wheezes. He has no rales. He exhibits no tenderness.  Abdominal: Soft. Bowel sounds  are normal. He exhibits no distension. There is no tenderness.  Musculoskeletal: Normal range of motion. He exhibits no edema and no tenderness.  Lymphadenopathy:    He has no cervical adenopathy.  Neurological: He is alert and oriented to person, place, and time. Coordination normal.  Skin: Skin is warm and dry. No rash noted. No erythema.  Psychiatric: He has a normal mood and affect. His behavior is normal. Judgment and thought content normal.           Assessment and Plan        The

## 2012-08-17 NOTE — Assessment & Plan Note (Signed)
No arrhythmia either clinically or by event monitor or on EKG today. We'll continue current medications. We have given him propranolol to take as needed for any tachycardia or palpitations concerning for atrial fibrillation.

## 2012-08-17 NOTE — Patient Instructions (Addendum)
You are doing well. Please take propranolol 1 to 2 pills as needed for tachycardia or  palpitations  Please call us if you have new issues that need to be addressed before your next appt.  Your physician wants you to follow-up in: 12 months.  You will receive a reminder letter in the mail two months in advance. If you don't receive a letter, please call our office to schedule the follow-up appointment.

## 2012-08-21 ENCOUNTER — Other Ambulatory Visit: Payer: Self-pay | Admitting: Internal Medicine

## 2012-08-21 MED ORDER — MELOXICAM 15 MG PO TABS: 15.0000 mg | ORAL_TABLET | Freq: Every day | ORAL | Status: DC

## 2012-08-21 NOTE — Telephone Encounter (Signed)
Pt is needing refill on Meloxicam. 15 mg it has to be set up to be a 90 day script per patient. He uses [facility]

## 2012-09-05 LAB — HM COLONOSCOPY: HM Colonoscopy: NORMAL

## 2012-09-13 ENCOUNTER — Ambulatory Visit: Payer: Self-pay | Admitting: General Surgery

## 2012-10-09 ENCOUNTER — Other Ambulatory Visit: Payer: Self-pay | Admitting: *Deleted

## 2012-10-09 MED ORDER — PROPRANOLOL HCL 10 MG PO TABS: 10.0000 mg | ORAL_TABLET | Freq: Three times a day (TID) | ORAL | Status: DC | PRN

## 2012-10-09 NOTE — Telephone Encounter (Signed)
Refilled Propranolol ?

## 2012-11-20 ENCOUNTER — Other Ambulatory Visit: Payer: Self-pay | Admitting: Internal Medicine

## 2012-11-20 MED ORDER — HYDROCODONE-ACETAMINOPHEN 5-325 MG PO TABS: 1.0000 | ORAL_TABLET | Freq: Four times a day (QID) | ORAL | Status: DC | PRN

## 2012-11-21 ENCOUNTER — Other Ambulatory Visit: Payer: Self-pay | Admitting: Internal Medicine

## 2012-11-22 NOTE — Telephone Encounter (Signed)
Med already had refills.

## 2012-12-04 ENCOUNTER — Ambulatory Visit (INDEPENDENT_AMBULATORY_CARE_PROVIDER_SITE_OTHER): Payer: 59 | Admitting: Internal Medicine

## 2012-12-04 ENCOUNTER — Encounter: Payer: Self-pay | Admitting: Internal Medicine

## 2012-12-04 VITALS — BP 130/80 | HR 66 | Temp 98.6°F | Resp 12 | Wt 370.5 lb

## 2012-12-04 DIAGNOSIS — R5381 Other malaise: Secondary | ICD-10-CM

## 2012-12-04 DIAGNOSIS — M159 Polyosteoarthritis, unspecified: Secondary | ICD-10-CM

## 2012-12-04 DIAGNOSIS — I10 Essential (primary) hypertension: Secondary | ICD-10-CM

## 2012-12-04 DIAGNOSIS — R0602 Shortness of breath: Secondary | ICD-10-CM

## 2012-12-04 DIAGNOSIS — R5383 Other fatigue: Secondary | ICD-10-CM

## 2012-12-04 DIAGNOSIS — E66813 Obesity, class 3: Secondary | ICD-10-CM

## 2012-12-04 MED ORDER — HYDROCODONE-ACETAMINOPHEN 10-500 MG PO TABS: 1.0000 | ORAL_TABLET | Freq: Four times a day (QID) | ORAL | Status: DC | PRN

## 2012-12-04 NOTE — Progress Notes (Signed)
Patient ID: Johnathan [name], male   DOB: 09-29-1956, 57 y.o.   MRN: [id number]   Patient Active Problem List  Diagnosis  . A-fib  . Palpitations  . Shortness of breath  . Essential hypertension, malignant  . Other malaise and fatigue  . Obesity, Class III, BMI 40-49.9 (morbid obesity)  . 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 Follow-up    on medications    HPI:   Johnathan [name] a 57 y.o. male who presents Profound fatigue.  Worse than usual.  Finding that he fFalls asleep if he sit still for 10 minutes. Can only work in the yard for 1 hours before becoming utterly exhausted.  Has deferred sleep studies in the past because he does not snore.  History of paroxysmal atrial fibrillation using diltiazem for rate control and attributes symptoms to medications.  2) worsening joint pain . 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 To [place].  3) morbid obesity   Not exercising and not following a low GI diet.  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  He has to have Dr Mariah Milling sign off on his EKG    Past Medical History  Diagnosis Date  . Hypertension   . B12 deficiency   . Arthritis   . Obesity   . Arrhythmia     A-fib  . History of DVT (deep vein thrombosis)     s/p bariatric surgery    Past Surgical History  Procedure Laterality Date  . Bariatric surgery  2004    Rou en Y   . Arthroscopic repair acl  1993    bilateral knee  . Cholecystectomy  1982  . Meniscus debridement  2011    Left knee       The following portions of the patient's history were reviewed and updated as appropriate: Allergies, current medications, and problem list.    Review of Systems:   12 Pt   review of systems was negative except those addressed in the HPI,     History   Social History  . Marital Status: Married    Spouse Name: N/A    Number of Children: N/A  . Years of Education: N/A   Occupational History  . Not on file.   Social History Main Topics  . Smoking status: Never Smoker   . Smokeless tobacco: Never Used  . Alcohol Use: No  . Drug Use: No  . Sexually Active: Not on file   Other Topics Concern  . Not on file   Social History Narrative  . No narrative on file    Objective:  BP 130/80  Pulse 66  Temp(Src) 98.6 F (37 C) (Oral)  Resp 12  Wt 370 lb 8 oz (168.058 kg)  BMI 46.31 kg/m2  SpO2 96%  General appearance: alert, cooperative and appears stated age Ears: normal TM's and external ear canals both ears Throat: lips, mucosa, and tongue normal; teeth and gums normal Neck: no adenopathy, no carotid bruit, supple, symmetrical, trachea midline and thyroid not enlarged, symmetric, no tenderness/mass/nodules Back: symmetric, no curvature. ROM normal. No CVA tenderness. Lungs: clear to auscultation bilaterally Heart: regular  rate and rhythm, S1, S2 normal, no murmur, click, rub or gallop Abdomen: soft, non-tender; bowel sounds normal; no masses,  no organomegaly Pulses: 2+ and symmetric Skin: Skin color, texture, turgor normal. No rashes or lesions Lymph nodes: Cervical, supraclavicular, and axillary nodes normal.  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  vicodin strength increased to 10 mg .   Obesity, Class III, BMI 40-49.9 (morbid obesity) Spent 15 minutes advising patient on need to address his obesity which is the cause of his exertional tachycardia, dyspnea and worsening joint pain.  Low GI diet handout given.  Advised to start a walking program at home. .   Essential hypertension, malignant Well controlled  on current regimen of losartan and diltiazem. . Renal function stable, no changes today.  Shortness of breath Multifactorial secondary to atrial fib, morbid obesity and deconditioning.  Resting HR is 66  ,ambulatory HR ranges from 188 to 140 today .  He many need an ambulator Holter monitor to see if his ambulatory HR is due to atrial fib with RVR vs sinus tch due to deconditioning. Will discuss to his cardiologist   Other malaise and fatigue no signs of anemia, thyroid disease,, liver and kdieny disesae by priro workup. obesity plus/minus atrial fib playing a role here?   A total of 40 minutes was spent with patient more than half of which was spent in counseling, reviewing records from other providers and coordination of care. Updated Medication List Outpatient Encounter Prescriptions as of 12/04/2012  Medication Sig Dispense Refill  . aspirin 81 MG tablet Take 81 mg by mouth daily.      . B Complex Vitamins (B COMPLEX 50 PO) Take by mouth daily.      Marland Kitchen buPROPion (WELLBUTRIN XL) 300 MG 24 hr tablet Take 1 tablet (300 mg total) by mouth daily.  90 tablet  3  . Cholecalciferol (VITAMIN D3) 2000 UNITS TABS Take by mouth daily.      Marland Kitchen diltiazem (DILACOR XR) 240 MG 24 hr capsule Take 1 capsule (240 mg total) by mouth daily.  90 capsule  3  . FERROUS GLUCONATE PO Take by mouth daily.      Marland Kitchen GLUCOSAMINE HCL-MSM PO Take by mouth daily.      Marland Kitchen losartan-hydrochlorothiazide (HYZAAR) 50-12.5 MG per tablet Take 1 tablet by mouth daily.  90 tablet  3  . meloxicam (MOBIC) 15 MG tablet Take 1 tablet (15 mg total) by mouth daily.  90 tablet  2  . Multiple Vitamin (MULTIVITAMIN) tablet Take 1 tablet by mouth daily.      . propranolol (INDERAL) 10 MG tablet Take 1 tablet (10 mg total) by mouth 3 (three) times daily as needed.  90 tablet  3  . SAW PALMETTO-PHYTOSTEROLS PO Take 320 mg by mouth daily.      . vitamin B-12 (CYANOCOBALAMIN) 500 MCG tablet Take 500 mcg by mouth daily.      . vitamin E 400 UNIT  capsule Take 400 Units by mouth daily.      . [DISCONTINUED] HYDROcodone-acetaminophen (NORCO/VICODIN) 5-325 MG per tablet Take 1 tablet by mouth every 6 (six) hours as needed for pain.  90 tablet  3  . HYDROcodone-acetaminophen (LORTAB) 10-500 MG per tablet Take 1 tablet by mouth every 6 (six) hours as needed for pain.  120 tablet  3   No facility-administered encounter medications on  file as of 12/04/2012.     Orders Placed This Encounter  Procedures  . HM COLONOSCOPY    No Follow-up on file.

## 2012-12-04 NOTE — Patient Instructions (Addendum)
You need to lose 10%  Of your current body weight over the next 6 months   This is  my version of a  "Low GI"  Diet:  It is not ultra low carb, but will still lower your blood sugars and allow you to lose 5 to 10 lbs per month if you follow it carefully. All of the foods can be found at grocery stores and in bulk at [employer].  The Atkins protein bars and shakes are available in more varieties at [employer], [employer] and [employer].     7 AM Breakfast:  Low carbohydrate Protein  Shakes (I recommend the EAS AdvantEdge "Carb Control" shakes  Or the low carb shakes by Atkins.   Both are available everywhere:  In  cases at [employer]  Or in 4 packs at grocery stores and pharmacies  2.5 carbs  (Alternative is  a toasted Arnold's Sandwhich Thin w/ peanut butter, a "Bagel Thin" with cream cheese and salmon) or  a scrambled egg burrito made with a low carb tortilla .  Avoid cereal and bananas, oatmeal too unless you are cooking the old fashioned kind that takes 30-40 minutes to prepare.  the rest is overly processed, has minimal fiber, and is loaded with carbohydrates!   10 AM: Protein bar by Atkins (the snack size, under 200 cal).  There are many varieties , available widely again or in bulk in limited varieties at [employer]Js)  Other so called "protein bars" tend to be loaded with carbohydrates.  Remember, in food advertising, the word "energy" is synonymous for " carbohydrate."  Lunch: sandwich of turkey, (or any lunchmeat, grilled meat or canned tuna), fresh avocado, mayonnaise  and cheese on a lower carbohydrate pita bread, flatbread, or tortilla . Ok to use regular mayonnaise. The bread is the only source or carbohydrate that can be decreased (Joseph's makes a pita bread and a flat bread that are 50 cal and 4 net carbs ; Toufayan makes a low carb flatbread that's 100 cal and 9 net carbs  and  Mission makes a low carb whole wheat tortilla  That is 210 cal and 6 net carbs)  3 PM:  Mid day :  Another protein bar,  Or a  cheese  stick (100 cal, 0 carbs),  Or 1 ounce of  almonds, walnuts, pistachios, pecans, peanuts,  Macadamia nuts. Or a Dannon light n Fit greek yogurt, 80 cal 8 net carbs . Avoid "granola"; the dried cranberries and raisins are loaded with carbohydrates. Mixed nuts ok if no raisins or cranberries or dried fruit.      6 PM  Dinner:  "mean and green:"  Meat/chicken/fish or a high protein legume; , with a green salad, and a low GI  Veggie (broccoli, cauliflower, green beans, spinach, brussel sprouts. Lima beans) : Avoid "Low fat dressings, as well as [place] and [place]! They are loaded with sugar! Instead use ranch, vinagrette,  Blue cheese, etc.  There is a low carb pasta by Dreamfield's available at [employer] that is acceptable and tastes great. Try Michel Angel's chicken piccata over low carb pasta. The chicken dish is 0 carbs, and can be found in frozen section at [employer]Js and [employer]. Also try Aaron Sanchez's "Carnitas" (pulled pork, no sauce,  0 carbs) and his pot roast.   both are in the refrigerated section at [employer]Js   Dreamfield's makes a low carb pasta only 5 g/serving.  Available at all grocery stores,  And tastes like normal   pasta  9 PM snack : Breyer's "low carb" fudgsicle or  ice cream bar (Carb Smart line), or  Weight Watcher's ice cream bar , or another "no sugar added" ice cream;a serving of fresh berries/cherries with whipped cream (Avoid bananas, pineapple, grapes  and watermelon on a regular basis because they are high in sugar)   Remember that snack Substitutions should be less than 10 carbs per serving and meals < 20 carbs. Remember to subtract fiber grams and sugar alcohols to get the "net carbs."  

## 2012-12-04 NOTE — Assessment & Plan Note (Addendum)
Did not keep appt with Alucio bc of vacation issues he will re schedule.  vicodin strength increased to 10 mg .

## 2012-12-05 NOTE — Assessment & Plan Note (Signed)
Well controlled on current regimen of losartan and diltiazem. . Renal function stable, no changes today.

## 2012-12-05 NOTE — Assessment & Plan Note (Signed)
Spent 15 minutes advising patient on need to address his obesity which is the cause of his exertional tachycardia, dyspnea and worsening joint pain.  Low GI diet handout given.  Advised to start a walking program at home. Marland Kitchen

## 2012-12-05 NOTE — Assessment & Plan Note (Signed)
no signs of anemia, thyroid disease,, liver and kdieny disesae by priro workup. obesity plus/minus atrial fib playing a role here?

## 2012-12-05 NOTE — Assessment & Plan Note (Signed)
Multifactorial secondary to atrial fib, morbid obesity and deconditioning.  Resting HR is 66  ,ambulatory HR ranges from 188 to 140 today .  He many need an ambulator Holter monitor to see if his ambulatory HR is due to atrial fib with RVR vs sinus tch due to deconditioning. Will discuss to his cardiologist

## 2012-12-11 ENCOUNTER — Telehealth: Payer: Self-pay | Admitting: Internal Medicine

## 2012-12-11 NOTE — Telephone Encounter (Signed)
Leavy Cella phone [phone] lortab 10/500mg  Can we change rx to 10-325 no longer have 10-500mg  Please advise

## 2012-12-12 MED ORDER — HYDROCODONE-ACETAMINOPHEN 10-325 MG PO TABS: 1.0000 | ORAL_TABLET | Freq: Four times a day (QID) | ORAL | Status: DC | PRN

## 2012-12-12 NOTE — Telephone Encounter (Signed)
rx changed,  On printer,  Ok to call in

## 2012-12-12 NOTE — Telephone Encounter (Signed)
Confirmed with the pharmacy that the new script had been called in.

## 2013-03-16 ENCOUNTER — Ambulatory Visit (INDEPENDENT_AMBULATORY_CARE_PROVIDER_SITE_OTHER): Payer: 59 | Admitting: Internal Medicine

## 2013-03-16 ENCOUNTER — Encounter: Payer: Self-pay | Admitting: Internal Medicine

## 2013-03-16 VITALS — BP 132/76 | HR 74 | Temp 98.4°F | Resp 16 | Wt 333.0 lb

## 2013-03-16 DIAGNOSIS — I10 Essential (primary) hypertension: Secondary | ICD-10-CM

## 2013-03-16 DIAGNOSIS — M159 Polyosteoarthritis, unspecified: Secondary | ICD-10-CM

## 2013-03-16 DIAGNOSIS — M1710 Unilateral primary osteoarthritis, unspecified knee: Secondary | ICD-10-CM

## 2013-03-16 DIAGNOSIS — IMO0002 Reserved for concepts with insufficient information to code with codable children: Secondary | ICD-10-CM

## 2013-03-16 MED ORDER — HYDROCODONE-ACETAMINOPHEN 10-325 MG PO TABS: 1.0000 | ORAL_TABLET | Freq: Four times a day (QID) | ORAL | Status: DC | PRN

## 2013-03-16 NOTE — Progress Notes (Signed)
Patient ID: Johnathan [name], male   DOB: 1956-06-05, 57 y.o.   MRN: [id number]   Patient Active Problem List   Diagnosis Date Noted  . Other malaise and fatigue 07/10/2012  . Obesity, Class III, BMI 40-49.9 (morbid obesity) 07/10/2012  . 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 A-fib 06/19/2012  . Palpitations 06/19/2012  . Shortness of breath 06/19/2012  . Essential hypertension, malignant 06/19/2012    Subjective:  CC:   Chief Complaint  Patient presents with  . Follow-up    Refill vicodin    HPI:   Johnathan [name] a 57 y.o. male who presents for follow up on multiple conditions including joint pain involving multiple sites, OSA and obesity.    2) His sciatic nerve in right buttock is bothering him.     2) OSA:  He was  prescribed CPAP and has been using them for the past 10 days.    3) Morbid obesity.  He has achieved a Wt loss of 40 lbs  Since last visit on low GI diet.    4) Bilateral knee pain.  Despite losing the weight he has persistent pain and wants to see ortho now ,  Left knee the worst.    Past Medical History  Diagnosis Date  . Hypertension   . B12 deficiency   . Arthritis   . Obesity   . Arrhythmia     A-fib  . History of DVT (deep vein thrombosis)     s/p bariatric surgery    Past Surgical History  Procedure Laterality Date  . Bariatric surgery  2004    [place]   . Arthroscopic repair acl  1993    bilateral knee  . Cholecystectomy  1982  . Meniscus debridement  2011    Left knee       The following portions of the patient's history were reviewed and updated as appropriate: Allergies, current medications, and problem list.    Review of Systems:   12 Pt  review of systems was negative except those addressed in the HPI,     History   Social History  . Marital Status: Married    Spouse Name: N/A    Number of Children: N/A  . Years of Education: N/A   Occupational History  .  Not on file.   Social History Main Topics  . Smoking status: Never Smoker   . Smokeless tobacco: Never Used  . Alcohol Use: No  . Drug Use: No  . Sexually Active: Not on file   Other Topics Concern  . Not on file   Social History Narrative  . No narrative on file    Objective:  BP 132/76  Pulse 74  Temp(Src) 98.4 F (36.9 C) (Oral)  Resp 16  Wt 333 lb (151.048 kg)  BMI 41.62 kg/m2  SpO2 98%  General appearance: alert, cooperative and appears stated age Ears: normal TM's and external ear canals both ears Throat: lips, mucosa, and tongue normal; teeth and gums normal Neck: no adenopathy, no carotid bruit, supple, symmetrical, trachea midline and thyroid not enlarged, symmetric, no tenderness/mass/nodules Back: symmetric, no curvature. ROM normal. No CVA tenderness. Lungs: clear to auscultation bilaterally Heart: regular rate and rhythm, S1, S2 normal, no murmur, click, rub or gallop Abdomen: soft, non-tender; bowel sounds normal; no masses,  no organomegaly Pulses: 2+ and symmetric Skin: Skin color, texture, turgor normal. No rashes or lesions Lymph nodes: Cervical, supraclavicular, and axillary nodes  normal.  Assessment and Plan:  Obesity, Class III, BMI 40-49.9 (morbid obesity) He has lost 40 lbs since last visit by following a low GI diet .  The weight loss has started to plateau.  He has not  started walking yet bu plans to start a walking program.   Essential hypertension, malignant Well controlled on current regimen. Renal function stable, no changes today.  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  Referral to Milinda Antis, MD .   Updated Medication List Outpatient Encounter Prescriptions as of 03/16/2013  Medication Sig Dispense Refill  . aspirin 81 MG tablet Take 81 mg by mouth daily.      . B Complex Vitamins (B COMPLEX 50 PO) Take by mouth daily.      Marland Kitchen buPROPion (WELLBUTRIN  XL) 300 MG 24 hr tablet Take 1 tablet (300 mg total) by mouth daily.  90 tablet  3  . Cholecalciferol (VITAMIN D3) 2000 UNITS TABS Take by mouth daily.      Marland Kitchen diltiazem (DILACOR XR) 240 MG 24 hr capsule Take 1 capsule (240 mg total) by mouth daily.  90 capsule  3  . FERROUS GLUCONATE PO Take by mouth daily.      Marland Kitchen GLUCOSAMINE HCL-MSM PO Take by mouth daily.      Marland Kitchen HYDROcodone-acetaminophen (NORCO) 10-325 MG per tablet Take 1 tablet by mouth every 6 (six) hours as needed for pain.  120 tablet  3  . losartan-hydrochlorothiazide (HYZAAR) 50-12.5 MG per tablet Take 1 tablet by mouth daily.  90 tablet  3  . meloxicam (MOBIC) 15 MG tablet Take 1 tablet (15 mg total) by mouth daily.  90 tablet  2  . Multiple Vitamin (MULTIVITAMIN) tablet Take 1 tablet by mouth daily.      . SAW PALMETTO-PHYTOSTEROLS PO Take 320 mg by mouth daily.      . vitamin B-12 (CYANOCOBALAMIN) 500 MCG tablet Take 500 mcg by mouth daily.      . vitamin E 400 UNIT capsule Take 400 Units by mouth daily.      . [DISCONTINUED] HYDROcodone-acetaminophen (NORCO) 10-325 MG per tablet Take 1 tablet by mouth every 6 (six) hours as needed for pain.  120 tablet  3  . propranolol (INDERAL) 10 MG tablet Take 1 tablet (10 mg total) by mouth 3 (three) times daily as needed.  90 tablet  3   No facility-administered encounter medications on file as of 03/16/2013.     Orders Placed This Encounter  Procedures  . Ambulatory referral to Orthopedic Surgery    No Follow-up on file.

## 2013-03-16 NOTE — Assessment & Plan Note (Addendum)
He has lost 40 lbs since last visit by following a low GI diet .  The weight loss has started to plateau.  He has not  started walking yet bu plans to start a walking program.

## 2013-03-16 NOTE — Patient Instructions (Addendum)
To manage your nocturnal congestion from the CPAP ,  Try this sinus regimen:   Flush first with simply saline Then use the steroid nasal spray  Then one squirt of afrin each side   Apt with hooten after the 14th early am or late pm

## 2013-03-18 ENCOUNTER — Encounter: Payer: Self-pay | Admitting: Internal Medicine

## 2013-03-18 NOTE — Assessment & Plan Note (Addendum)
Requiring daily use of meloxicam and narcotics.  Referral to Milinda Antis, MD .

## 2013-03-18 NOTE — Assessment & Plan Note (Signed)
Well controlled on current regimen. Renal function stable, no changes today. 

## 2013-04-03 ENCOUNTER — Encounter: Payer: Self-pay | Admitting: Internal Medicine

## 2013-05-24 ENCOUNTER — Other Ambulatory Visit: Payer: Self-pay | Admitting: Internal Medicine

## 2013-05-24 NOTE — Telephone Encounter (Signed)
Last visit 6/14. Refill?

## 2013-06-17 LAB — HEPATIC FUNCTION PANEL: ALT: 18 U/L (ref 10–40)

## 2013-06-30 ENCOUNTER — Other Ambulatory Visit: Payer: Self-pay | Admitting: Cardiovascular Disease

## 2013-07-02 ENCOUNTER — Other Ambulatory Visit: Payer: Self-pay | Admitting: *Deleted

## 2013-07-02 MED ORDER — DILTIAZEM HCL ER 240 MG PO CP24: 240.0000 mg | ORAL_CAPSULE | Freq: Every day | ORAL | Status: DC

## 2013-07-02 NOTE — Telephone Encounter (Signed)
Refilled Diltiazem sent to [facility] pharmacy.

## 2013-07-04 ENCOUNTER — Ambulatory Visit (INDEPENDENT_AMBULATORY_CARE_PROVIDER_SITE_OTHER): Payer: 59 | Admitting: Internal Medicine

## 2013-07-04 ENCOUNTER — Encounter: Payer: Self-pay | Admitting: Internal Medicine

## 2013-07-04 VITALS — BP 148/96 | HR 120 | Temp 98.5°F | Resp 14 | Ht 75.0 in | Wt 329.5 lb

## 2013-07-04 DIAGNOSIS — R0602 Shortness of breath: Secondary | ICD-10-CM

## 2013-07-04 DIAGNOSIS — M159 Polyosteoarthritis, unspecified: Secondary | ICD-10-CM

## 2013-07-04 DIAGNOSIS — Z23 Encounter for immunization: Secondary | ICD-10-CM

## 2013-07-04 DIAGNOSIS — M2550 Pain in unspecified joint: Secondary | ICD-10-CM | POA: Insufficient documentation

## 2013-07-04 DIAGNOSIS — M130 Polyarthritis, unspecified: Secondary | ICD-10-CM | POA: Insufficient documentation

## 2013-07-04 DIAGNOSIS — G629 Polyneuropathy, unspecified: Secondary | ICD-10-CM

## 2013-07-04 DIAGNOSIS — G589 Mononeuropathy, unspecified: Secondary | ICD-10-CM

## 2013-07-04 DIAGNOSIS — M064 Inflammatory polyarthropathy: Secondary | ICD-10-CM | POA: Insufficient documentation

## 2013-07-04 DIAGNOSIS — Z9884 Bariatric surgery status: Secondary | ICD-10-CM | POA: Insufficient documentation

## 2013-07-04 MED ORDER — OXYCODONE-ACETAMINOPHEN 10-325 MG PO TABS: 1.0000 | ORAL_TABLET | Freq: Four times a day (QID) | ORAL | Status: DC | PRN

## 2013-07-04 NOTE — Progress Notes (Signed)
Patient ID: Johnathan [name], male   DOB: 1955/11/21, 57 y.o.   MRN: [id number]   Patient Active Problem List   Diagnosis Date Noted  . Polyarthritis of multiple sites 07/04/2013  . Neuropathy 07/04/2013  . S/P bariatric surgery 07/04/2013  . Other malaise and fatigue 07/10/2012  . Obesity, Class III, BMI 40-49.9 (morbid obesity) 07/10/2012  . 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 A-fib 06/19/2012  . Palpitations 06/19/2012  . Shortness of breath 06/19/2012  . Essential hypertension, malignant 06/19/2012    Subjective:  CC:   Chief Complaint  Patient presents with  . Follow-up    knee problem on left side,had previous bariatric surgery and would like blood work.    HPI:   Johnathan [name] a 57 y.o. male who presents with progressively worsening joint pain for several months.  The pain is present continually and aggravated by activity.  Joints that are particular;y painful include both wrists, the small joints of both hands,  And both shoulders.  He has decreased mobility of right shoulder due to pain.  He has known DJD of both knees and his current use of vicodin is no longer effective in managing his daily pain . He is awaiting knee surgery which has been scheduled but may have to be delayed because he was laid off and is losing his insurance from [place] at the end of the month .  His neuropathy is getting worse and was previously limited to his feet but is now involving his lower legs as well.    His pain is no longer controlled with vicodin .  He is having difficulty walking due to constant pain ,     Past Medical History  Diagnosis Date  . Hypertension   . B12 deficiency   . Arthritis   . Obesity   . Arrhythmia     A-fib  . History of DVT (deep vein thrombosis)     s/p bariatric surgery    Past Surgical History  Procedure Laterality Date  . Bariatric surgery  2004    [place]   . Arthroscopic repair acl  1993   bilateral knee  . Cholecystectomy  1982  . Meniscus debridement  2011    Left knee       The following portions of the patient's history were reviewed and updated as appropriate: Allergies, current medications, and problem list.    Review of Systems:   12 Pt  review of systems was negative except those addressed in the HPI,     History   Social History  . Marital Status: Married    Spouse Name: N/A    Number of Children: N/A  . Years of Education: N/A   Occupational History  . Not on file.   Social History Main Topics  . Smoking status: Never Smoker   . Smokeless tobacco: Never Used  . Alcohol Use: No  . Drug Use: No  . Sexual Activity: Not on file   Other Topics Concern  . Not on file   Social History Narrative  . No narrative on file    Objective:  Filed Vitals:   07/04/13 1419  BP: 148/96  Pulse: 120  Temp: 98.5 F (36.9 C)  Resp: 14     General appearance: alert, cooperative and appears stated age Back: symmetric, no curvature. ROM normal. No CVA tenderness. Lungs: clear to auscultation bilaterally Heart: regular rate and rhythm, S1, S2 normal, no murmur,  click, rub or gallop Abdomen: soft, non-tender; bowel sounds normal; no masses,  no organomegaly Pulses: 2+ and symmetric Skin: Skin color, texture, turgor normal. No rashes or lesions Lymph nodes: Cervical, supraclavicular, and axillary nodes normal. MSK: no synovitis of hands or wrist joints.  Decreased ROM right shoulder secondary to pain.   Assessment and Plan:  Polyarthritis of multiple sites He is requesting rheumatologic evaluation for ruling out inflammatory arthritis.  Serologies for gout, RA, SLE have been ordered through [facility].  Pain management not currently adequate with hydrocodone.  Trial of oxycodone.   S/P bariatric surgery He has not had follow up labs for vitamin deficiencies in over a year and his bariatric surgery occurred in 2004 in [place].  These labs have been  ordered today.   Shortness of breath Multifactorial secondary to morbid obesity and deconditioning. 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 He is trying to get on to his wife's insurance.  Obesity, Class III, BMI 40-49.9 (morbid obesity) He has been earnestly trying to lose weight prior to knee surgery and has lost 105 of his body weight thus far.   Prior bariatric surgery in 2004 .    Updated Medication List Outpatient Encounter Prescriptions as of 07/04/2013  Medication Sig Dispense Refill  . aspirin 81 MG tablet Take 81 mg by mouth daily.      . B Complex Vitamins (B COMPLEX 50 PO) Take by mouth daily.      Marland Kitchen buPROPion (WELLBUTRIN XL) 300 MG 24 hr tablet Take 1 tablet (300 mg total) by mouth daily.  90 tablet  3  . Cholecalciferol (VITAMIN D3) 2000 UNITS TABS Take by mouth daily.      Marland Kitchen diltiazem (DILACOR XR) 240 MG 24 hr capsule Take 1 capsule (240 mg total) by mouth daily.  90 capsule  3  . FERROUS GLUCONATE PO Take by mouth daily.      Marland Kitchen GLUCOSAMINE HCL-MSM PO Take by mouth daily.      Marland Kitchen HYDROcodone-acetaminophen (NORCO) 10-325 MG per tablet Take 1 tablet by mouth every 6 (six) hours as needed for pain.  120 tablet  3  . losartan-hydrochlorothiazide (HYZAAR) 50-12.5 MG per tablet Take 1 tablet by mouth daily.  90 tablet  3  . meloxicam (MOBIC) 15 MG tablet TAKE 1 TABLET BY MOUTH EVERY DAY  90 tablet  1  . Multiple Vitamin (MULTIVITAMIN) tablet Take 1 tablet by mouth daily.      . SAW PALMETTO-PHYTOSTEROLS PO Take 320 mg by mouth daily.      . vitamin B-12 (CYANOCOBALAMIN) 500 MCG tablet Take 500 mcg by mouth daily.      . vitamin E 400 UNIT capsule Take 400  Units by mouth daily.      Marland Kitchen oxyCODONE-acetaminophen (PERCOCET) 10-325 MG per tablet Take 1 tablet by mouth every 6 (six) hours as needed for pain.  120 tablet  0  . propranolol (INDERAL) 10 MG tablet Take 1 tablet (10 mg total) by mouth 3 (three) times daily as needed.  90 tablet  3   No facility-administered encounter medications on file as of 07/04/2013.     Orders Placed  This Encounter  Procedures  . Flu Vaccine QUAD 36+ mos PF IM (Fluarix)    No Follow-up on file.

## 2013-07-04 NOTE — Patient Instructions (Addendum)
I am changing your narcotic to generic percocet.  You can take it every 6 hours as needed for pain   Labs as soon as possible

## 2013-07-07 NOTE — Assessment & Plan Note (Addendum)
He has been earnestly trying to lose weight prior to knee surgery and has lost 105 of his body weight thus far.   Prior bariatric surgery in 2004 .

## 2013-07-07 NOTE — Assessment & Plan Note (Addendum)
His greatly anticipated knee surgery is in jeopardy due to impending loss of insurance as a result of being laid off by [employer] at the end of September. He is trying to get on to his wife's insurance.

## 2013-07-07 NOTE — Assessment & Plan Note (Signed)
He has not had follow up labs for vitamin deficiencies in over a year and his bariatric surgery occurred in 2004 in [place].  These labs have been ordered today.

## 2013-07-07 NOTE — Assessment & Plan Note (Addendum)
Multifactorial secondary to morbid obesity and deconditioning. Previously evaluated with 24 hr  event monitor: sinus rhythm with PACs  ECHO:  EF 50 to 55% ("Low normal") no WMA or valvular dysfunction

## 2013-07-07 NOTE — Assessment & Plan Note (Addendum)
He is requesting rheumatologic evaluation for ruling out inflammatory arthritis.  Serologies for gout, RA, SLE have been ordered through [facility].  Pain management not currently adequate with hydrocodone.  Trial of oxycodone.

## 2013-07-09 LAB — HEPATIC FUNCTION PANEL
AST: 30 U/L (ref 14–40)
Bilirubin, Total: 0.4 mg/dL

## 2013-07-09 LAB — BASIC METABOLIC PANEL
BUN: 20 mg/dL (ref 4–21)
Creatinine: 0.9 mg/dL (ref 0.6–1.3)
Glucose: 76 mg/dL
Potassium: 4.2 mmol/L (ref 3.4–5.3)
Sodium: 142 mmol/L (ref 137–147)

## 2013-07-09 LAB — LIPID PANEL
CHOLESTEROL: 126 mg/dL (ref 0–200)
HDL: 50 mg/dL (ref 35–70)
LDL Cholesterol: 57 mg/dL
Triglycerides: 97 mg/dL (ref 40–160)

## 2013-07-09 LAB — TSH: TSH: 1.23 u[IU]/mL (ref 0.41–5.90)

## 2013-07-11 HISTORY — PX: REPLACEMENT TOTAL KNEE: SUR1224

## 2013-07-13 ENCOUNTER — Telehealth: Payer: Self-pay | Admitting: Internal Medicine

## 2013-07-13 NOTE — Telephone Encounter (Signed)
Patient stated he never eats seafood,but drinks a lot of soda. FYI

## 2013-07-13 NOTE — Telephone Encounter (Signed)
Some of his blood work is still pending but his cholesterol thyroid liver and kidney function B12 K1 vitamin E. he and vitamin D are all normal. 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 This can happen if he had recently ingested seafood. Does he eat seafood regularly??

## 2013-07-16 ENCOUNTER — Ambulatory Visit: Payer: Self-pay | Admitting: General Practice

## 2013-07-16 LAB — URINALYSIS, COMPLETE
Bacteria: NONE SEEN
Bilirubin,UR: NEGATIVE
Blood: NEGATIVE
Glucose,UR: NEGATIVE mg/dL (ref 0–75)
Ph: 5 (ref 4.5–8.0)
Protein: NEGATIVE
Specific Gravity: 1.019 (ref 1.003–1.030)
WBC UR: 1 /HPF (ref 0–5)

## 2013-07-16 LAB — BASIC METABOLIC PANEL
Anion Gap: 7 (ref 7–16)
BUN: 21 mg/dL — ABNORMAL HIGH (ref 7–18)
Co2: 25 mmol/L (ref 21–32)
Creatinine: 0.92 mg/dL (ref 0.60–1.30)
Osmolality: 280 (ref 275–301)
Sodium: 139 mmol/L (ref 136–145)

## 2013-07-16 LAB — MRSA PCR SCREENING

## 2013-07-16 LAB — CBC
MCHC: 34.9 g/dL (ref 32.0–36.0)
Platelet: 132 10*3/uL — ABNORMAL LOW (ref 150–440)
RDW: 14.1 % (ref 11.5–14.5)
WBC: 6.8 10*3/uL (ref 3.8–10.6)

## 2013-07-16 LAB — APTT: Activated PTT: 31.9 secs (ref 23.6–35.9)

## 2013-07-16 LAB — SEDIMENTATION RATE: Erythrocyte Sed Rate: 5 mm/hr (ref 0–20)

## 2013-07-17 ENCOUNTER — Ambulatory Visit: Payer: 59 | Admitting: Internal Medicine

## 2013-07-18 ENCOUNTER — Telehealth: Payer: Self-pay | Admitting: Internal Medicine

## 2013-07-18 DIAGNOSIS — R768 Other specified abnormal immunological findings in serum: Secondary | ICD-10-CM

## 2013-07-18 DIAGNOSIS — M130 Polyarthritis, unspecified: Secondary | ICD-10-CM

## 2013-07-18 NOTE — Telephone Encounter (Signed)
Labs complete.  The previously noted arsenic level is safe and related to food consumption,  Not the kind that can make you sick.  Nothing to do about it.   His vitamin A is is high,  He needs to stop any vitamin a  Supplements  His ANA was positive,  This is a nonspecific test that may suggest an autoimmune etiology for his joint pain .  I am recommending a referral to a rheumatologist for further evaluation if he desires.

## 2013-07-19 NOTE — Telephone Encounter (Signed)
Patient notified of results as requested, patient would like to defer rheumatology until after knee replacement surgery he is having next week and he is able to move about at least one month stated by patient. FYI

## 2013-07-19 NOTE — Telephone Encounter (Signed)
Patient called back and decided he would like to schedule sometime late November for rheumatology.

## 2013-07-19 NOTE — Telephone Encounter (Signed)
Referral is in process as requested 

## 2013-07-20 NOTE — Telephone Encounter (Signed)
Vanessa notified pt of appt.

## 2013-07-30 ENCOUNTER — Inpatient Hospital Stay: Payer: Self-pay | Admitting: General Practice

## 2013-07-31 LAB — PLATELET COUNT: Platelet: 128 10*3/uL — ABNORMAL LOW (ref 150–440)

## 2013-07-31 LAB — BASIC METABOLIC PANEL WITH GFR
Anion Gap: 6 — ABNORMAL LOW (ref 7–16)
BUN: 15 mg/dL (ref 7–18)
Calcium, Total: 7.9 mg/dL — ABNORMAL LOW (ref 8.5–10.1)
Chloride: 104 mmol/L (ref 98–107)
Co2: 26 mmol/L (ref 21–32)
Creatinine: 0.96 mg/dL (ref 0.60–1.30)
EGFR (African American): >60
EGFR (Non-African Amer.): >60
Glucose: 99 mg/dL (ref 65–99)
Osmolality: 273 (ref 275–301)
Potassium: 3.7 mmol/L (ref 3.5–5.1)
Sodium: 136 mmol/L (ref 136–145)

## 2013-07-31 LAB — HEMOGLOBIN: HGB: 12.6 g/dL — ABNORMAL LOW (ref 13.0–18.0)

## 2013-08-01 LAB — PLATELET COUNT: Platelet: 125 10*3/uL — ABNORMAL LOW (ref 150–440)

## 2013-08-01 LAB — BASIC METABOLIC PANEL
Anion Gap: 5 — ABNORMAL LOW (ref 7–16)
Calcium, Total: 8.3 mg/dL — ABNORMAL LOW (ref 8.5–10.1)
Chloride: 103 mmol/L (ref 98–107)
Creatinine: 0.93 mg/dL (ref 0.60–1.30)
EGFR (African American): >60
Glucose: 103 mg/dL — ABNORMAL HIGH (ref 65–99)
Potassium: 3.8 mmol/L (ref 3.5–5.1)
Sodium: 135 mmol/L — ABNORMAL LOW (ref 136–145)

## 2013-08-03 ENCOUNTER — Other Ambulatory Visit: Payer: Self-pay | Admitting: Internal Medicine

## 2013-08-03 NOTE — Telephone Encounter (Signed)
Eprescribed.

## 2013-08-15 ENCOUNTER — Telehealth: Payer: Self-pay | Admitting: Internal Medicine

## 2013-08-15 NOTE — Telephone Encounter (Signed)
Called to inform the pt did not show for this appt.  States he did call approx 30 minutes before appt to cancel.

## 2013-09-18 ENCOUNTER — Other Ambulatory Visit: Payer: Self-pay | Admitting: *Deleted

## 2013-09-18 MED ORDER — MELOXICAM 15 MG PO TABS: ORAL_TABLET | ORAL | Status: DC

## 2013-09-18 NOTE — Telephone Encounter (Signed)
Ok refill? 

## 2013-09-27 ENCOUNTER — Other Ambulatory Visit: Payer: Self-pay | Admitting: *Deleted

## 2013-09-28 MED ORDER — MELOXICAM 15 MG PO TABS: ORAL_TABLET | ORAL | Status: DC

## 2013-09-28 MED ORDER — DILTIAZEM HCL ER 240 MG PO CP24: 240.0000 mg | ORAL_CAPSULE | Freq: Every day | ORAL | Status: DC

## 2013-09-28 MED ORDER — BUPROPION HCL ER (XL) 300 MG PO TB24: 300.0000 mg | ORAL_TABLET | Freq: Every day | ORAL | Status: DC

## 2013-09-28 MED ORDER — LOSARTAN POTASSIUM-HCTZ 50-12.5 MG PO TABS: ORAL_TABLET | ORAL | Status: DC

## 2013-10-16 ENCOUNTER — Ambulatory Visit (INDEPENDENT_AMBULATORY_CARE_PROVIDER_SITE_OTHER): Payer: 59 | Admitting: Internal Medicine

## 2013-10-16 ENCOUNTER — Encounter: Payer: Self-pay | Admitting: Internal Medicine

## 2013-10-16 VITALS — BP 136/88 | HR 72 | Temp 98.1°F | Resp 14 | Wt 342.8 lb

## 2013-10-16 DIAGNOSIS — M130 Polyarthritis, unspecified: Secondary | ICD-10-CM

## 2013-10-16 DIAGNOSIS — E66813 Obesity, class 3: Secondary | ICD-10-CM

## 2013-10-16 DIAGNOSIS — I10 Essential (primary) hypertension: Secondary | ICD-10-CM

## 2013-10-16 MED ORDER — HYDROCODONE-ACETAMINOPHEN 10-325 MG PO TABS: 1.0000 | ORAL_TABLET | Freq: Two times a day (BID) | ORAL | Status: DC

## 2013-10-16 MED ORDER — HYDROCODONE-ACETAMINOPHEN 10-325 MG PO TABS: 1.0000 | ORAL_TABLET | Freq: Three times a day (TID) | ORAL | Status: DC | PRN

## 2013-10-16 NOTE — Progress Notes (Signed)
Pre-visit discussion using our clinic review tool. No additional management support is needed unless otherwise documented below in the visit note.  

## 2013-10-16 NOTE — Assessment & Plan Note (Addendum)
Wt gain despite doing rehab and going to [employer] 3 times per week.   Will recommend the low glycemic index diet. .  Lab Results  Component Value Date   TSH 1.23 07/09/2013

## 2013-10-16 NOTE — Progress Notes (Signed)
Patient ID: Johnathan [name], male   DOB: 23-Feb-1956, 58 y.o.   MRN: [id number]   Patient Active Problem List   Diagnosis Date Noted  . Unspecified essential hypertension 10/17/2013  . Polyarthritis of multiple sites 07/04/2013  . Neuropathy 07/04/2013  . S/P bariatric surgery 07/04/2013  . Other malaise and fatigue 07/10/2012  . Obesity, Class III, BMI 40-49.9 (morbid obesity) 07/10/2012  . 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 A-fib 06/19/2012  . Palpitations 06/19/2012  . Shortness of breath 06/19/2012    Subjective:  CC:   Chief Complaint  Patient presents with  . Follow-up    pain medication    HPI:   Johnathan [name] a 58 y.o. male who presents Follow up on joint pain, morbid obesity despite prior gastric bypass surgery.  Underwent left TKR on oct 20th  With no complications, but was terminated at work and has been inactive at home due to lack of schedule.  Looking for work.  Knee is still little stiff but the pain has improved significantly,  Still has other joints hurting but feels the  percocet is too strong    Past Medical History  Diagnosis Date  . Hypertension   . B12 deficiency   . Arthritis   . Obesity   . Arrhythmia     A-fib  . History of DVT (deep vein thrombosis)     s/p bariatric surgery    Past Surgical History  Procedure Laterality Date  . Bariatric surgery  2004    [place]   . Arthroscopic repair acl  1993    bilateral knee  . Cholecystectomy  1982  . Meniscus debridement  2011    Left knee       The following portions of the patient's history were reviewed and updated as appropriate: Allergies, current medications, and problem list.    Review of Systems:   12 Pt  review of systems was negative except those addressed in the HPI,     History   Social History  . Marital Status: Married    Spouse Name: N/A    Number of Children: N/A  . Years of Education: N/A   Occupational  History  . Not on file.   Social History Main Topics  . Smoking status: Never Smoker   . Smokeless tobacco: Never Used  . Alcohol Use: No  . Drug Use: No  . Sexual Activity: Not on file   Other Topics Concern  . Not on file   Social History Narrative  . No narrative on file    Objective:  Filed Vitals:   10/16/13 1454  BP: 136/88  Pulse: 72  Temp: 98.1 F (36.7 C)  Resp: 14     General appearance: alert, cooperative and appears stated age Ears: normal TM's and external ear canals both ears Throat: lips, mucosa, and tongue normal; teeth and gums normal Neck: no adenopathy, no carotid bruit, supple, symmetrical, trachea midline and thyroid not enlarged, symmetric, no tenderness/mass/nodules Back: symmetric, no curvature. ROM normal. No CVA tenderness. Lungs: clear to auscultation bilaterally Heart: regular rate and rhythm, S1, S2 normal, no murmur, click, rub or gallop Abdomen: soft, non-tender; bowel sounds normal; no masses,  no organomegaly Pulses: 2+ and symmetric Skin: Skin color, texture, turgor normal. No rashes or lesions Lymph nodes: Cervical, supraclavicular, and axillary nodes normal.  Assessment and Plan:  Obesity, Class III, BMI 40-49.9 (morbid obesity) Wt gain despite doing rehab and going  to [employer] 3 times per week.   Will recommend the low glycemic index diet. .  Lab Results  Component Value Date   TSH 1.23 07/09/2013    Polyarthritis of multiple sites Aggravated by morbid obesity and weight bearing acitvity.  DC percocet, resume hydrocodone  Unspecified essential hypertension Well controlled on current regimen. Renal function stable, no changes today.  Lab Results  Component Value Date   CREATININE 0.9 07/09/2013     Essential hypertension, malignant Well controlled on current regimen. Renal function stable, no changes today.  Lab Results  Component Value Date   CREATININE 0.91 07/14/2012      Updated Medication  List Outpatient Encounter Prescriptions as of 10/16/2013  Medication Sig  . aspirin 81 MG tablet Take 81 mg by mouth daily.  . B Complex Vitamins (B COMPLEX 50 PO) Take by mouth daily.  Marland Kitchen buPROPion (WELLBUTRIN XL) 300 MG 24 hr tablet Take 1 tablet (300 mg total) by mouth daily.  . Cholecalciferol (VITAMIN D3) 2000 UNITS TABS Take by mouth daily.  Marland Kitchen diltiazem (DILACOR XR) 240 MG 24 hr capsule Take 1 capsule (240 mg total) by mouth daily.  Marland Kitchen FERROUS GLUCONATE PO Take by mouth daily.  Marland Kitchen GLUCOSAMINE HCL-MSM PO Take by mouth daily.  Marland Kitchen losartan-hydrochlorothiazide (HYZAAR) 50-12.5 MG per tablet TAKE 1 TABLET BY MOUTH DAILY.  . meloxicam (MOBIC) 15 MG tablet TAKE 1 TABLET BY MOUTH EVERY DAY  . Multiple Vitamin (MULTIVITAMIN) tablet Take 1 tablet by mouth daily.  . SAW PALMETTO-PHYTOSTEROLS PO Take 320 mg by mouth daily.  . vitamin B-12 (CYANOCOBALAMIN) 500 MCG tablet Take 500 mcg by mouth daily.  . vitamin E 400 UNIT capsule Take 400 Units by mouth daily.  Marland Kitchen HYDROcodone-acetaminophen (NORCO) 10-325 MG per tablet Take 1 tablet by mouth 2 (two) times daily.  . propranolol (INDERAL) 10 MG tablet Take 1 tablet (10 mg total) by mouth 3 (three) times daily as needed.  . [DISCONTINUED] HYDROcodone-acetaminophen (NORCO) 10-325 MG per tablet Take 1 tablet by mouth every 6 (six) hours as needed for pain.  . [DISCONTINUED] HYDROcodone-acetaminophen (NORCO) 10-325 MG per tablet Take 1 tablet by mouth every 8 (eight) hours as needed.  . [DISCONTINUED] HYDROcodone-acetaminophen (NORCO) 10-325 MG per tablet Take 1 tablet by mouth 2 (two) times daily.  . [DISCONTINUED] oxyCODONE-acetaminophen (PERCOCET) 10-325 MG per tablet Take 1 tablet by mouth every 6 (six) hours as needed for pain.     Orders Placed This Encounter  Procedures  . Basic metabolic panel  . Lipid panel  . Hepatic function panel  . Hepatic function panel  . TSH    No Follow-up on file.

## 2013-10-17 ENCOUNTER — Encounter: Payer: Self-pay | Admitting: Internal Medicine

## 2013-10-17 DIAGNOSIS — I10 Essential (primary) hypertension: Secondary | ICD-10-CM | POA: Insufficient documentation

## 2013-10-17 NOTE — Assessment & Plan Note (Signed)
Aggravated by morbid obesity and weight bearing acitvity.  DC percocet, resume hydrocodone

## 2013-10-17 NOTE — Assessment & Plan Note (Signed)
Well controlled on current regimen. Renal function stable, no changes today.  Lab Results  Component Value Date   CREATININE 0.9 07/09/2013

## 2013-10-17 NOTE — Assessment & Plan Note (Signed)
Well controlled on current regimen. Renal function stable, no changes today.  Lab Results  Component Value Date   CREATININE 0.91 07/14/2012

## 2013-11-13 ENCOUNTER — Telehealth: Payer: Self-pay | Admitting: Internal Medicine

## 2013-11-13 NOTE — Telephone Encounter (Signed)
Relevant patient education assigned to patient using Emmi. ° °

## 2013-12-07 ENCOUNTER — Telehealth: Payer: Self-pay | Admitting: *Deleted

## 2013-12-07 MED ORDER — HYDROCODONE-ACETAMINOPHEN 10-325 MG PO TABS: 1.0000 | ORAL_TABLET | Freq: Two times a day (BID) | ORAL | Status: DC

## 2013-12-07 NOTE — Telephone Encounter (Signed)
Ok to refill,  printed rx  For 2 monhts,  Will need to be seen prio to refills for May

## 2013-12-07 NOTE — Telephone Encounter (Signed)
Patient called for refill on vicodin  Stated you filled for two month last time please advise.

## 2013-12-07 NOTE — Telephone Encounter (Signed)
Patient notified and will pick up Monday placed up front.

## 2014-01-01 ENCOUNTER — Ambulatory Visit (INDEPENDENT_AMBULATORY_CARE_PROVIDER_SITE_OTHER): Payer: 59 | Admitting: *Deleted

## 2014-01-01 VITALS — Wt 347.8 lb

## 2014-01-01 NOTE — Progress Notes (Signed)
Patient wanted weight documented for insurance.

## 2014-01-02 ENCOUNTER — Ambulatory Visit: Payer: 59

## 2014-01-06 ENCOUNTER — Other Ambulatory Visit: Payer: Self-pay | Admitting: Internal Medicine

## 2014-01-07 NOTE — Telephone Encounter (Signed)
Refill? Last visit 10/16/13

## 2014-01-28 ENCOUNTER — Ambulatory Visit (INDEPENDENT_AMBULATORY_CARE_PROVIDER_SITE_OTHER): Payer: 59 | Admitting: Internal Medicine

## 2014-01-28 ENCOUNTER — Encounter: Payer: Self-pay | Admitting: Internal Medicine

## 2014-01-28 VITALS — BP 126/80 | HR 50 | Temp 98.9°F | Resp 16 | Wt 341.0 lb

## 2014-01-28 DIAGNOSIS — M159 Polyosteoarthritis, unspecified: Secondary | ICD-10-CM

## 2014-01-28 DIAGNOSIS — Z79899 Other long term (current) drug therapy: Secondary | ICD-10-CM

## 2014-01-28 DIAGNOSIS — I10 Essential (primary) hypertension: Secondary | ICD-10-CM

## 2014-01-28 DIAGNOSIS — E559 Vitamin D deficiency, unspecified: Secondary | ICD-10-CM

## 2014-01-28 MED ORDER — HYDROCODONE-ACETAMINOPHEN 10-325 MG PO TABS: 1.0000 | ORAL_TABLET | Freq: Four times a day (QID) | ORAL | Status: DC | PRN

## 2014-01-28 MED ORDER — HYDROCODONE-ACETAMINOPHEN 10-325 MG PO TABS: 1.0000 | ORAL_TABLET | Freq: Two times a day (BID) | ORAL | Status: DC

## 2014-01-28 NOTE — Progress Notes (Signed)
Patient ID: Johnathan [name], male   DOB: 16-Oct-1955, 58 y.o.   MRN: [id number]  Patient Active Problem List   Diagnosis Date Noted  . Unspecified essential hypertension 10/17/2013  . Polyarthritis of multiple sites 07/04/2013  . Neuropathy 07/04/2013  . S/P bariatric surgery 07/04/2013  . Other malaise and fatigue 07/10/2012  . Obesity, Class III, BMI 40-49.9 (morbid obesity) 07/10/2012  . 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 A-fib 06/19/2012  . Palpitations 06/19/2012  . Shortness of breath 06/19/2012    Subjective:  CC:   Chief Complaint  Patient presents with  . Follow-up    medication refills    HPI:   Johnathan [name] is a 58 y.o. male who presents for Follow up on chronic pain secondary to DJD of mutiple joints, aggravated by obesity,  with history of gastric bypass.Marland Kitchen.  He was last seen January . He underwent left TKR in November 2014 and is still considering Right TKR in the near future.  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  He continues to have significant joint pain and has nocturnal pain not relieved with meloxicam.  Taking two vicodin daily for managment of daytime pain aggravated by standing for 8 to 10 hours daily. Taking glucosamine and iron supplements. He is following a low glycemic index but has not lost any weight in the last month.,  He is eating 300 to 400 calories  every 3 hours.     Past Medical History  Diagnosis Date  . Hypertension   . B12 deficiency   . Arthritis   . Obesity   . Arrhythmia     A-fib  . History of DVT (deep vein thrombosis)     s/p bariatric surgery    Past Surgical History  Procedure Laterality Date  . Bariatric surgery  2004    Rou en Y   . Arthroscopic repair acl  1993    bilateral knee  .  Cholecystectomy  1982  . Meniscus debridement  2011    Left knee       The following portions of the patient's history were reviewed and updated as appropriate: Allergies, current medications, and problem list.    Review of Systems:   Patient denies headache, fevers, malaise, unintentional weight loss, skin rash, eye pain, sinus congestion and sinus pain, sore throat, dysphagia,  hemoptysis , cough, dyspnea, wheezing, chest pain, palpitations, orthopnea, edema, abdominal pain, nausea, melena, diarrhea, constipation, flank pain, dysuria, hematuria, urinary  Frequency, nocturia, numbness, tingling, seizures,  Focal weakness, Loss of consciousness,  Tremor, insomnia, depression, anxiety, and suicidal ideation.     History   Social History  . Marital Status: Married    Spouse Name: N/A    Number of Children: N/A  . Years of Education: N/A   Occupational History  . Not on file.   Social History Main Topics  . Smoking status: Never Smoker   . Smokeless tobacco: Never Used  . Alcohol Use: No  . Drug Use: No  . Sexual Activity: Not on file   Other Topics Concern  . Not on file   Social History Narrative  . No narrative on file    Objective:  Filed Vitals:   01/28/14 1625  BP: 126/80  Pulse:  50  Temp: 98.9 F (37.2 C)  Resp: 16     General appearance: alert, cooperative and appears stated age Ears: normal TM's and external ear canals both ears Throat: lips, mucosa, and tongue normal; teeth and gums normal Neck: no adenopathy, no carotid bruit, supple, symmetrical, trachea midline and thyroid not enlarged, symmetric, no tenderness/mass/nodules Back: symmetric, no curvature. ROM normal. No CVA tenderness. Lungs: clear to auscultation bilaterally Heart: regular rate and rhythm, S1, S2 normal, no murmur, click, rub or gallop Abdomen: soft, non-tender; bowel sounds normal; no masses,  no organomegaly Pulses: 2+ and symmetric Skin: Skin color, texture, turgor normal.  No rashes or lesions Lymph nodes: Cervical, supraclavicular, and axillary nodes normal.  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  Refill given,  Follow up in July   Obesity, Class III, BMI 40-49.9 (morbid obesity) Advised to take a 3 day break from low GI diet then resume , and consider reducing calories to 2000 daily   Unspecified essential hypertension Well controlled on current regimen. Renal function stable, no changes today.  Lab Results  Component Value Date   CREATININE 0.9 07/09/2013   Lab Results  Component Value Date   NA 142 07/09/2013   K 4.2 07/09/2013   CL 104 07/14/2012   CO2 21 07/14/2012       Updated Medication List Outpatient Encounter Prescriptions as of 01/28/2014  Medication Sig  . aspirin 81 MG tablet Take 81 mg by mouth daily.  . B Complex Vitamins (B COMPLEX 50 PO) Take by mouth daily.  Marland Kitchen. buPROPion (WELLBUTRIN XL) 300 MG 24 hr tablet Take 1 tablet (300 mg total) by mouth daily.  . Cholecalciferol (VITAMIN D3) 2000 UNITS TABS Take by mouth daily.  Marland Kitchen. diltiazem (DILACOR XR) 240 MG 24 hr capsule Take 1 capsule (240 mg total) by mouth daily.  Marland Kitchen. FERROUS GLUCONATE PO Take by mouth daily.  Marland Kitchen. GLUCOSAMINE HCL-MSM PO Take by mouth daily.  Marland Kitchen. HYDROcodone-acetaminophen (NORCO) 10-325 MG per tablet Take 1 tablet by mouth every 6 (six) hours as needed.  Marland Kitchen. losartan-hydrochlorothiazide (HYZAAR) 50-12.5 MG per tablet Take 1 tablet by mouth  daily  . meloxicam (MOBIC) 15 MG tablet Take 1 tablet by mouth  every day  . Multiple Vitamin (MULTIVITAMIN) tablet Take 1 tablet by mouth daily.  . propranolol (INDERAL) 10 MG tablet Take 1 tablet (10 mg total) by mouth 3 (three) times daily as needed.  . vitamin B-12 (CYANOCOBALAMIN) 500 MCG tablet Take 500 mcg by mouth daily.  . vitamin E 400 UNIT capsule Take 400 Units by mouth daily.  . [DISCONTINUED] HYDROcodone-acetaminophen (NORCO)  10-325 MG per tablet Take 1 tablet by mouth 2 (two) times daily.  . [DISCONTINUED] HYDROcodone-acetaminophen (NORCO) 10-325 MG per tablet Take 1 tablet by mouth 2 (two) times daily.  . [DISCONTINUED] HYDROcodone-acetaminophen (NORCO) 10-325 MG per tablet Take 1 tablet by mouth every 6 (six) hours as needed.  . SAW PALMETTO-PHYTOSTEROLS PO Take 320 mg by mouth daily.     Orders Placed This Encounter  Procedures  . Vit D  25 hydroxy (rtn osteoporosis monitoring)  . Comprehensive metabolic panel    No Follow-up on file.

## 2014-01-28 NOTE — Patient Instructions (Addendum)
I have increased your vicodin to 3 or 4 daily .  Please take only as needed.    Return in July ,

## 2014-01-28 NOTE — Progress Notes (Signed)
Pre-visit discussion using our clinic review tool. No additional management support is needed unless otherwise documented below in the visit note.  

## 2014-01-29 ENCOUNTER — Encounter: Payer: Self-pay | Admitting: Internal Medicine

## 2014-01-29 LAB — COMPREHENSIVE METABOLIC PANEL
ALBUMIN: 4 g/dL (ref 3.5–5.2)
ALT: 27 U/L (ref 0–53)
AST: 26 U/L (ref 0–37)
Alkaline Phosphatase: 89 U/L (ref 39–117)
BUN: 25 mg/dL — AB (ref 6–23)
CALCIUM: 9.2 mg/dL (ref 8.4–10.5)
CHLORIDE: 109 meq/L (ref 96–112)
CO2: 24 mEq/L (ref 19–32)
Creatinine, Ser: 0.9 mg/dL (ref 0.4–1.5)
GFR: 88.54 mL/min (ref >60.00)
GLUCOSE: 80 mg/dL (ref 70–99)
Potassium: 4.5 mEq/L (ref 3.5–5.1)
Sodium: 142 mEq/L (ref 135–145)
Total Bilirubin: 0.9 mg/dL (ref 0.3–1.2)
Total Protein: 7.5 g/dL (ref 6.0–8.3)

## 2014-01-29 LAB — VITAMIN D 25 HYDROXY (VIT D DEFICIENCY, FRACTURES): VIT D 25 HYDROXY: 33 ng/mL (ref 30–89)

## 2014-01-29 NOTE — Assessment & Plan Note (Signed)
Well controlled on current regimen. Renal function stable, no changes today.  Lab Results  Component Value Date   CREATININE 0.9 07/09/2013   Lab Results  Component Value Date   NA 142 07/09/2013   K 4.2 07/09/2013   CL 104 07/14/2012   CO2 21 07/14/2012

## 2014-01-29 NOTE — Assessment & Plan Note (Signed)
Advised to take a 3 day break from low GI diet then resume , and consider reducing calories to 2000 daily

## 2014-01-29 NOTE — Assessment & Plan Note (Signed)
Increased his vicodin to 3-4 daily.  Refill given,  Follow up in July

## 2014-03-13 ENCOUNTER — Encounter: Payer: Self-pay | Admitting: Internal Medicine

## 2014-04-29 ENCOUNTER — Ambulatory Visit (INDEPENDENT_AMBULATORY_CARE_PROVIDER_SITE_OTHER): Payer: 59 | Admitting: Internal Medicine

## 2014-04-29 ENCOUNTER — Encounter: Payer: Self-pay | Admitting: Internal Medicine

## 2014-04-29 VITALS — BP 142/100 | HR 64 | Temp 97.8°F | Resp 12 | Ht 75.0 in | Wt 338.5 lb

## 2014-04-29 DIAGNOSIS — M130 Polyarthritis, unspecified: Secondary | ICD-10-CM

## 2014-04-29 DIAGNOSIS — M25519 Pain in unspecified shoulder: Secondary | ICD-10-CM

## 2014-04-29 DIAGNOSIS — M25511 Pain in right shoulder: Secondary | ICD-10-CM

## 2014-04-29 DIAGNOSIS — I480 Paroxysmal atrial fibrillation: Secondary | ICD-10-CM

## 2014-04-29 DIAGNOSIS — M159 Polyosteoarthritis, unspecified: Secondary | ICD-10-CM

## 2014-04-29 DIAGNOSIS — I4891 Unspecified atrial fibrillation: Secondary | ICD-10-CM

## 2014-04-29 DIAGNOSIS — I10 Essential (primary) hypertension: Secondary | ICD-10-CM

## 2014-04-29 MED ORDER — HYDROCODONE-ACETAMINOPHEN 10-325 MG PO TABS: 1.0000 | ORAL_TABLET | Freq: Four times a day (QID) | ORAL | Status: DC | PRN

## 2014-04-29 MED ORDER — METOPROLOL SUCCINATE ER 50 MG PO TB24: 50.0000 mg | ORAL_TABLET | Freq: Every day | ORAL | Status: DC

## 2014-04-29 NOTE — Assessment & Plan Note (Addendum)
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  I  increased his vicodin to 3-4 daily.

## 2014-04-29 NOTE — Assessment & Plan Note (Signed)
Advised to take a 3 day break from low GI diet then resume , and consider reducing calories to 2000 daily  

## 2014-04-29 NOTE — Progress Notes (Signed)
Patient ID: Johnathan [name], male   DOB: 1956-06-27, 58 y.o.   MRN: [id number]  Patient Active Problem List   Diagnosis Date Noted  . Unspecified essential hypertension 10/17/2013  . Polyarthritis of multiple sites 07/04/2013  . Neuropathy 07/04/2013  . S/P bariatric surgery 07/04/2013  . Other malaise and fatigue 07/10/2012  . Obesity, Class III, BMI 40-49.9 (morbid obesity) 07/10/2012  . 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 A-fib 06/19/2012  . Palpitations 06/19/2012  . Shortness of breath 06/19/2012    Subjective:  CC:   Chief Complaint  Patient presents with  . Follow-up    3 month persistant pain all joints of extremeties.    HPI:   Johnathan [name] is a 58 y.o. male who presents for  Increased pain issues.  Now waking up with muscles aching  , including thighs,  Shoulders  Too stiff to raise shoulders above his headh above head.  Job is physically strenuous, lifting about  300 boxes daily .  No synovitis,  No history of trauma.  Shoulders clicking and popping at night bilaterally .  Could not lift a 7 lb gallon of ea with extended arm .Marland Kitchen  Using 2 to  3 daily    Has had cortisone shots in the past and wants a referral to an orthopeist in [place] for additional treatment. 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 By the weekend he is exhausted and spends the entire weekend recovering  HTN: elevated today    Past Medical History  Diagnosis Date  . Hypertension   . B12 deficiency   . Arthritis   . Obesity   . Arrhythmia     A-fib  . History of DVT (deep vein thrombosis)     s/p bariatric surgery    Past Surgical History  Procedure Laterality Date  . Bariatric surgery  2004    Rou en Y   . Arthroscopic repair acl  1993    bilateral knee  . Cholecystectomy  1982  . Meniscus debridement  2011    Left knee       The following portions  of the patient's history were reviewed and updated as appropriate: Allergies, current medications, and problem list.    Review of Systems:   Patient denies headache, fevers, malaise, unintentional weight loss, skin rash, eye pain, sinus congestion and sinus pain, sore throat, dysphagia,  hemoptysis , cough, dyspnea, wheezing, chest pain, palpitations, orthopnea, edema, abdominal pain, nausea, melena, diarrhea, constipation, flank pain, dysuria, hematuria, urinary  Frequency, nocturia, numbness, tingling, seizures,  Focal weakness, Loss of consciousness,  Tremor, insomnia, depression, anxiety, and suicidal ideation.     History   Social History  . Marital Status: Married    Spouse Name: N/A    Number of Children: N/A  . Years of Education: N/A   Occupational History  . Not on file.   Social History Main Topics  . Smoking status: Never Smoker   . Smokeless tobacco: Never Used  . Alcohol Use: No  . Drug Use: No  . Sexual Activity: Not on file   Other Topics Concern  . Not on file   Social History Narrative  . No narrative on file    Objective:  Filed Vitals:   04/29/14 1624  BP: 142/100  Pulse: 64  Temp:   Resp: 12  General appearance: alert, cooperative and appears stated age Ears: normal TM's and external ear canals both ears Throat: lips, mucosa, and tongue normal; teeth and gums normal Neck: no adenopathy, no carotid bruit, supple, symmetrical, trachea midline and thyroid not enlarged, symmetric, no tenderness/mass/nodules Back: symmetric, no curvature. ROM normal. No CVA tenderness. Lungs: clear to auscultation bilaterally Heart: regular rate and rhythm, S1, S2 normal, no murmur, click, rub or gallop Abdomen: soft, non-tender; bowel sounds normal; no masses,  no organomegaly Pulses: 2+ and symmetric Skin: Skin color, texture, turgor normal. No rashes or lesions Lymph nodes: Cervical, supraclavicular, and axillary nodes normal.  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  I  increased his vicodin to 3-4 daily.     Obesity, Class III, BMI 40-49.9 (morbid obesity) Advised to take a 3 day break from low GI diet then resume , and consider reducing calories to 2000 daily     Unspecified essential hypertension Not Well controlled on current regimen. Adding metoprolol 50 mg daily  And stopping propranolol  A-fib Not rate controlled with activity , complicated by morbid obesity and deconditioinng.   Polyarthritis of multiple sites Checking CRP, ESR to rule ou t PMR    Updated Medication List Outpatient Encounter Prescriptions as of 04/29/2014  Medication Sig  . aspirin 81 MG tablet Take 81 mg by mouth daily.  . B Complex Vitamins (B COMPLEX 50 PO) Take by mouth daily.  Marland Kitchen buPROPion (WELLBUTRIN XL) 300 MG 24 hr tablet Take 1 tablet (300 mg total) by mouth daily.  . Cholecalciferol (VITAMIN D3) 2000 UNITS TABS Take by mouth daily.  Marland Kitchen diltiazem (DILACOR XR) 240 MG 24 hr capsule Take 1 capsule (240 mg total) by mouth daily.  Marland Kitchen FERROUS GLUCONATE PO Take by mouth daily.  Marland Kitchen GLUCOSAMINE HCL-MSM PO Take by mouth daily.  Marland Kitchen HYDROcodone-acetaminophen (NORCO) 10-325 MG per tablet Take 1 tablet by mouth every 6 (six) hours as needed.  Marland Kitchen losartan-hydrochlorothiazide (HYZAAR) 50-12.5 MG per tablet Take 1 tablet by mouth  daily  . meloxicam (MOBIC) 15 MG tablet Take 1 tablet by mouth  every day  . metoprolol succinate (TOPROL-XL) 50 MG 24 hr tablet Take 1 tablet (50 mg total) by mouth daily. Take with or immediately following a meal.  . Multiple Vitamin (MULTIVITAMIN) tablet Take 1 tablet by mouth daily.  . SAW PALMETTO-PHYTOSTEROLS PO Take 320 mg by mouth daily.  . vitamin B-12 (CYANOCOBALAMIN) 500 MCG tablet Take 500 mcg by mouth daily.  . vitamin E 400 UNIT capsule Take 400 Units by  mouth daily.  . [DISCONTINUED] HYDROcodone-acetaminophen (NORCO) 10-325 MG per tablet Take 1 tablet by mouth every 6 (six) hours as needed.  . [DISCONTINUED] HYDROcodone-acetaminophen (NORCO) 10-325 MG per tablet Take 1 tablet by mouth every 6 (six) hours as needed.  . [DISCONTINUED] HYDROcodone-acetaminophen (NORCO) 10-325 MG per tablet Take 1 tablet by mouth every 6 (six) hours as needed.  . [DISCONTINUED] propranolol (INDERAL) 10 MG tablet Take 1 tablet (10 mg total) by mouth 3 (three) times daily as needed.     Orders Placed This Encounter  Procedures  . Lipid panel  . Comprehensive metabolic panel  . CBC with Differential  . Sedimentation rate  . C-reactive protein  . Ambulatory referral to Orthopedic Surgery    Return in about 3 months (around 07/30/2014).

## 2014-04-29 NOTE — Patient Instructions (Signed)
I am adding metoprolol succinate  50 mg daily in the evening,  To improve your heart rate and blood pressure  Please shcedule follow up with Dr Mariah Millinggollan  I will arrange referral to orthopedics in [place]

## 2014-04-29 NOTE — Progress Notes (Signed)
Pre-visit discussion using our clinic review tool. No additional management support is needed unless otherwise documented below in the visit note.  

## 2014-04-30 ENCOUNTER — Encounter: Payer: Self-pay | Admitting: Internal Medicine

## 2014-04-30 LAB — CBC AND DIFFERENTIAL
HEMATOCRIT: 40 % — AB (ref 41–53)
Hemoglobin: 13.9 g/dL (ref 13.5–17.5)
PLATELETS: 171 10*3/uL (ref 150–399)
WBC: 8 10^3/mL

## 2014-04-30 LAB — HEPATIC FUNCTION PANEL
ALK PHOS: 105 U/L (ref 25–125)
ALT: 26 U/L (ref 10–40)
AST: 23 U/L (ref 14–40)
Bilirubin, Total: 0.6 mg/dL

## 2014-04-30 LAB — BASIC METABOLIC PANEL
BUN: 25 mg/dL — AB (ref 4–21)
Creatinine: 1.2 mg/dL (ref 0.6–1.3)
Glucose: 129 mg/dL
Potassium: 4.4 mmol/L (ref 3.4–5.3)
Sodium: 143 mmol/L (ref 137–147)

## 2014-04-30 LAB — TSH: TSH: 1.4 u[IU]/mL (ref 0.41–5.90)

## 2014-04-30 NOTE — Assessment & Plan Note (Addendum)
Not rate controlled with activity , complicated by morbid obesity and deconditioinng.

## 2014-04-30 NOTE — Assessment & Plan Note (Addendum)
Not Well controlled on current regimen. Adding metoprolol 50 mg daily  And stopping propranolol

## 2014-04-30 NOTE — Assessment & Plan Note (Signed)
Checking CRP, ESR to rule ou t PMR

## 2014-05-06 ENCOUNTER — Encounter: Payer: Self-pay | Admitting: *Deleted

## 2014-05-06 ENCOUNTER — Ambulatory Visit (INDEPENDENT_AMBULATORY_CARE_PROVIDER_SITE_OTHER): Payer: 59 | Admitting: Cardiovascular Disease

## 2014-05-06 ENCOUNTER — Telehealth: Payer: Self-pay | Admitting: Internal Medicine

## 2014-05-06 ENCOUNTER — Encounter: Payer: Self-pay | Admitting: Cardiovascular Disease

## 2014-05-06 VITALS — BP 120/82 | HR 74 | Ht 75.0 in | Wt 341.5 lb

## 2014-05-06 DIAGNOSIS — R0789 Other chest pain: Secondary | ICD-10-CM

## 2014-05-06 DIAGNOSIS — Z9989 Dependence on other enabling machines and devices: Secondary | ICD-10-CM

## 2014-05-06 DIAGNOSIS — R0602 Shortness of breath: Secondary | ICD-10-CM | POA: Insufficient documentation

## 2014-05-06 DIAGNOSIS — R531 Weakness: Secondary | ICD-10-CM | POA: Insufficient documentation

## 2014-05-06 DIAGNOSIS — R5382 Chronic fatigue, unspecified: Secondary | ICD-10-CM

## 2014-05-06 DIAGNOSIS — G4733 Obstructive sleep apnea (adult) (pediatric): Secondary | ICD-10-CM | POA: Insufficient documentation

## 2014-05-06 DIAGNOSIS — I10 Essential (primary) hypertension: Secondary | ICD-10-CM

## 2014-05-06 DIAGNOSIS — M064 Inflammatory polyarthropathy: Secondary | ICD-10-CM

## 2014-05-06 DIAGNOSIS — R5381 Other malaise: Secondary | ICD-10-CM

## 2014-05-06 DIAGNOSIS — I4891 Unspecified atrial fibrillation: Secondary | ICD-10-CM

## 2014-05-06 DIAGNOSIS — R5383 Other fatigue: Secondary | ICD-10-CM | POA: Insufficient documentation

## 2014-05-06 MED ORDER — FUROSEMIDE 20 MG PO TABS: 20.0000 mg | ORAL_TABLET | Freq: Two times a day (BID) | ORAL | Status: DC | PRN

## 2014-05-06 MED ORDER — POTASSIUM CHLORIDE ER 10 MEQ PO TBCR: 10.0000 meq | EXTENDED_RELEASE_TABLET | Freq: Two times a day (BID) | ORAL | Status: DC | PRN

## 2014-05-06 MED ORDER — PROPRANOLOL HCL 20 MG PO TABS: 20.0000 mg | ORAL_TABLET | Freq: Three times a day (TID) | ORAL | Status: DC | PRN

## 2014-05-06 NOTE — Assessment & Plan Note (Signed)
Etiology is not clear. Unable to exclude acute on chronic diastolic CHF. He does have pitting lower extremity edema worse on the right than the left. He does drink significant fluids daily, over 1 gallon of water a long, not including other beverages. Recommended he try Lasix daily with limitation of his fluid intake as tolerated. Would aim for several pound weight loss, gentle diuresis.  Suggested he try Lasix for one week. If improvement of his symptoms, may be able to avoid additional workup. If no improvement, suggested he call our office for additional workup. May need Holter, stress testing, maybe even repeat echo

## 2014-05-06 NOTE — Progress Notes (Addendum)
Patient ID: Johnathan [name], male    DOB: 22-Dec-1955, 58 y.o.   MRN: [id number]  HPI Comments: 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  In followup today, he reports that he does not feel well. He has chronic fatigue, worsening shortness of breath with exertion. Reports this weekend was climbing stairs and felt very short of breath. His symptoms have been coming on for quite some time. He does not feel that this is normal. He does have lower extremity edema, uncertain about his weight gain but typically gained several pounds over the weekend. He works long hours in a factory, sweats much of the week. He does trend 11 gallon of water a day not including other drinks such as sodas. He is compliant on his CPAP. He sleeps only 5 to 6 hours per day, sleeps in a recliner. Unable to sleep in a bed secondary to chronic shoulder pain. He does not do any regular exercise walking apart from work. He denies any significant chest pain with exertion.  In the hospital 05/31/2012 with left-sided chest pain and palpitations. He was given adenosine in the emergency room that showed atrial fibrillation. He received Cardizem and started on diltiazem infusion and converted to normal sinus rhythm overnight. He was started on anticoagulation, continued on long-acting Cardizem and was discharged after echocardiogram showed normal LV function.  Echocardiogram showed normal LV function with ejection fraction 50-55%, normal right ventricular systolic pressure, normal left atrium TSH 1.7  Event monitor was performed September 16 2 07/25/2012 that showed normal sinus rhythm with PVCs, no significant atrial fibrillation Blood pressure at home has been labile, typically in the  130-140 systolic range.   EKG today shows normal sinus rhythm with rate 74 beats per minute, no significant ST or T wave changes, with PVCs   Outpatient Encounter Prescriptions as of 05/06/2014  Medication Sig  . aspirin 81 MG tablet Take 81 mg by mouth daily.  . B Complex Vitamins (B COMPLEX 50 PO) Take by mouth daily.  Marland Kitchen. buPROPion (WELLBUTRIN XL) 300 MG 24 hr tablet Take 1 tablet (300 mg total) by mouth daily.  . Cholecalciferol (VITAMIN D3) 2000 UNITS TABS Take by mouth daily.  Marland Kitchen. diltiazem (DILACOR XR) 240 MG 24 hr capsule Take 1 capsule (240 mg total) by mouth daily.  Marland Kitchen. FERROUS GLUCONATE PO Take by mouth daily.  Marland Kitchen. GLUCOSAMINE HCL-MSM PO Take by mouth daily.  Marland Kitchen. HYDROcodone-acetaminophen (NORCO) 10-325 MG per tablet Take 1 tablet by mouth every 6 (six) hours as needed.  Marland Kitchen. losartan-hydrochlorothiazide (HYZAAR) 50-12.5 MG per tablet Take 1 tablet by mouth  daily  . meloxicam (MOBIC) 15 MG tablet Take 1 tablet by mouth  every day  . metoprolol succinate (TOPROL-XL) 50 MG 24 hr tablet Take 1 tablet (50 mg total) by mouth daily. Take with or immediately following a meal.  . Multiple Vitamin (MULTIVITAMIN) tablet Take 1 tablet by mouth daily.  . SAW PALMETTO-PHYTOSTEROLS PO Take 320 mg by mouth daily.  . vitamin B-12 (CYANOCOBALAMIN) 500 MCG tablet Take 500 mcg by mouth daily.  . vitamin E 400 UNIT capsule Take 400 Units by mouth daily.     Review of Systems  Constitutional: Positive for fatigue.  HENT: Negative.   Eyes: Negative.   Respiratory: Positive for shortness of breath.  Cardiovascular: Negative.   Gastrointestinal: Negative.   Endocrine: Negative.   Musculoskeletal: Positive for arthralgias, gait problem and joint swelling.  Skin: Negative.   Allergic/Immunologic: Negative.   Neurological: Negative.   Hematological: Negative.   Psychiatric/Behavioral: Negative.   All other systems reviewed and are negative.   BP 120/82  Ht 6\' 3"  (1.905 m)  Wt 341 lb 8 oz (154.903 kg)   BMI 42.68 kg/m2  Physical Exam  Nursing note and vitals reviewed. Constitutional: He is oriented to person, place, and time. He appears well-developed and well-nourished.  obese  HENT:  Head: Normocephalic.  Nose: Nose normal.  Mouth/Throat: Oropharynx is clear and moist.  Eyes: Conjunctivae are normal. Pupils are equal, round, and reactive to light.  Neck: Normal range of motion. Neck supple. No JVD present.  Cardiovascular: Normal rate, regular rhythm, S1 normal, S2 normal, normal heart sounds and intact distal pulses.  Exam reveals no gallop and no friction rub.   No murmur heard. Trace to 1+ pitting edema on the right lower extremity, trace on the left  Pulmonary/Chest: Effort normal and breath sounds normal. No respiratory distress. He has no wheezes. He has no rales. He exhibits no tenderness.  Abdominal: Soft. Bowel sounds are normal. He exhibits no distension. There is no tenderness.  Musculoskeletal: Normal range of motion. He exhibits no edema and no tenderness.  Lymphadenopathy:    He has no cervical adenopathy.  Neurological: He is alert and oriented to person, place, and time. Coordination normal.  Skin: Skin is warm and dry. No rash noted. No erythema.  Psychiatric: He has a normal mood and affect. His behavior is normal. Judgment and thought content normal.      Assessment and Plan

## 2014-05-06 NOTE — Telephone Encounter (Signed)
All labs done recently were normal.  No signs of inflammatory arthritis , and no evidence of muscle breakdown

## 2014-05-06 NOTE — Assessment & Plan Note (Signed)
Recent started on metoprolol. Blood pressure better today. Denies having worsening fatigue on metoprolol so far

## 2014-05-06 NOTE — Assessment & Plan Note (Signed)
Reports being compliant on his CPAP. Wakes up feels tired

## 2014-05-06 NOTE — Patient Instructions (Signed)
Please take lasix as needed for shortness of breath, leg swelling Take with potassium  Please call us if you have new issues that need to be addressed before your next appt.  Your physician wants you to follow-up in: 1 month.

## 2014-05-06 NOTE — Assessment & Plan Note (Signed)
Biggest complaint today is his fatigue. Etiology is not clear. Recent blood work on this her TSH and testosterone were drawn. Of concern is his poor sleep hygiene. He sleeps only 5-6 hours per night, in the recliner. He reports that this is common for him. No regular exercise, very deconditioned at baseline. Reports having recent sleep study last year so likely does not need to have his settings changed on his CPAP

## 2014-05-06 NOTE — Assessment & Plan Note (Signed)
Normal sinus rhythm on EKG. Uncertain if he is having paroxysmal atrial fibrillation, particularly with exertion. Holter and routine treadmill study was offered. He would like to try alternatives at this time first

## 2014-05-06 NOTE — Telephone Encounter (Signed)
Letter mailed

## 2014-05-13 ENCOUNTER — Encounter: Payer: Self-pay | Admitting: Family Medicine

## 2014-05-13 ENCOUNTER — Ambulatory Visit (INDEPENDENT_AMBULATORY_CARE_PROVIDER_SITE_OTHER): Payer: 59 | Admitting: Family Medicine

## 2014-05-13 ENCOUNTER — Other Ambulatory Visit (INDEPENDENT_AMBULATORY_CARE_PROVIDER_SITE_OTHER): Payer: 59

## 2014-05-13 ENCOUNTER — Ambulatory Visit (INDEPENDENT_AMBULATORY_CARE_PROVIDER_SITE_OTHER)
Admission: RE | Admit: 2014-05-13 | Discharge: 2014-05-13 | Disposition: A | Discharge status: Home / Self Care | Payer: 59 | Source: Ambulatory Visit | Attending: Family Medicine | Admitting: Family Medicine

## 2014-05-13 VITALS — BP 120/80 | Ht 75.0 in | Wt 344.0 lb

## 2014-05-13 DIAGNOSIS — M25511 Pain in right shoulder: Secondary | ICD-10-CM

## 2014-05-13 DIAGNOSIS — M542 Cervicalgia: Secondary | ICD-10-CM | POA: Insufficient documentation

## 2014-05-13 DIAGNOSIS — M67919 Unspecified disorder of synovium and tendon, unspecified shoulder: Secondary | ICD-10-CM

## 2014-05-13 DIAGNOSIS — M25519 Pain in unspecified shoulder: Secondary | ICD-10-CM

## 2014-05-13 DIAGNOSIS — M25312 Other instability, left shoulder: Secondary | ICD-10-CM

## 2014-05-13 DIAGNOSIS — M25512 Pain in left shoulder: Principal | ICD-10-CM

## 2014-05-13 DIAGNOSIS — M719 Bursopathy, unspecified: Secondary | ICD-10-CM

## 2014-05-13 MED ORDER — GABAPENTIN 100 MG PO CAPS
100.0000 mg | ORAL_CAPSULE | Freq: Three times a day (TID) | ORAL | Status: DC
Start: 2014-05-13 — End: 2014-07-31

## 2014-05-13 NOTE — Progress Notes (Signed)
Johnathan [name] D.O. [facility] [address] [place], [place] [address] Phone: [phone] Subjective:    I'm seeing this patient by the request  of:  Johnathan [name],TERESA, MD   CC: Bilateral shoulder pain BJY:[id number]:Subjective Johnathan [name] is a 58 y.o. male coming in with complaint of bilateral shoulder pain left greater than right. Patient has had this pain for multiple months if not years. Patient notes that it is starting to lose range of motion and has severe popping and cracking that causes pain. Patient states that he is unable to be comfortable in any position including at night. Patient states that the severity is 9/10 it is increasing the hydrocodone from 2 pills daily to 3 pills daily recently with the primary care prescribing. Patient denies any radiation to the arms any numbness but is making it difficult to do even small activities of daily living such as getting his belt on. Patient states that he did have an injury multiple years ago to his neck but does not remember any true injury to his shoulders. She does do a lot of repetitive lifting and can be as high as 50 pounds at this time.     Past medical history, social, surgical and family history all reviewed in electronic medical record.   Review of Systems: No headache, visual changes, nausea, vomiting, diarrhea, constipation, dizziness, abdominal pain, skin rash, fevers, chills, night sweats, weight loss, swollen lymph nodes, body aches, joint swelling, muscle aches, chest pain, shortness of breath, mood changes.   Objective Blood pressure 120/80, height 6\' 3"  (1.905 m), weight 344 lb (156.037 kg).  General: No apparent distress alert and oriented x3 mood and affect normal, dressed appropriately. Obese HEENT: Pupils equal, extraocular movements intact  Respiratory: Patient's speak in full sentences and does not appear short of breath  Cardiovascular: No lower extremity edema, non tender, no erythema  Skin: Warm dry intact  with no signs of infection or rash on extremities or on axial skeleton.  Abdomen: Soft nontender  Neuro: Cranial nerves II through XII are intact, neurovascularly intact in all extremities with 2+ DTRs and 2+ pulses.  Lymph: No lymphadenopathy of posterior or anterior cervical chain or axillae bilaterally.  Gait normal with good balance and coordination.  MSK:  Non tender with full range of motion and good stability and symmetric strength and tone of  elbows, wrist, hip, knee and ankles bilaterally.  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 Positive Spurling's maneuver. Last 5 in all planes Grip strength and sensation normal in bilateral hands Strength good C4 to T1 distribution No sensory change to C4 to T1 Negative Hoffman sign bilaterally Reflexes normal Shoulder: Bilateral Inspection reveals no abnormalities, atrophy or asymmetry. Palpation is normal with no tenderness over AC joint or bicipital groove. ROM is full in all planes but severe crepitus left greater than right Rotator cuff strength normal throughout. signs of impingement with positive Neer and Hawkin's tests, empty can sign. Speeds and Yergason's tests normal. No labral pathology noted with negative Obrien's, negative clunk and good stability. Normal scapular function observed. No painful arc and no drop arm sign. No apprehension sign  MSK US performed of: Left This study was ordered, performed, and interpreted by Terrilee FilesZach Staley Lunz D.O.  Shoulder:   Supraspinatus:  Significant atrophy of the rotator cuff but no true tear appreciated. Mild hypoechoic changes and severe underlying osteoarthritic changes. Infraspinatus:  Appears normal on long  and transverse views. Subscapularis:  Appears normal on long and transverse views. Teres Minor:  Appears normal on long and transverse views. AC joint:  Capsule undistended, no geyser  sign. Glenohumeral Joint:  Moderate arthritis. Glenoid Labrum: Degenerative changes noted Biceps Tendon:  Appears normal on long and transverse views, no fraying of tendon, tendon located in intertubercular groove, no subluxation with shoulder internal or external rotation. No increased power doppler signal.   Procedure: Real-time Ultrasound Guided Injection of left glenohumeral joint Device: GE Logiq E  Ultrasound guided injection is preferred based studies that show increased duration, increased effect, greater accuracy, decreased procedural pain, increased response rate with ultrasound guided versus blind injection.  Verbal informed consent obtained.  Time-out conducted.  Noted no overlying erythema, induration, or other signs of local infection.  Skin prepped in a sterile fashion.  Local anesthesia: Topical Ethyl chloride.  With sterile technique and under real time ultrasound guidance:  Joint visualized.  23g 1  inch needle inserted posterior approach. Pictures taken for needle placement. Patient did have injection of 2 cc of 1% lidocaine, 2 cc of 0.5% Marcaine, and 1cc of Kenalog 40 mg/dL. Completed without difficulty  Pain immediately resolved suggesting accurate placement of the medication.  Advised to call if fevers/chills, erythema, induration, drainage, or persistent bleeding.  Images permanently stored and available for review in the ultrasound unit.  Impression: Technically successful ultrasound guided injection.    Impression and Recommendations:     This case required medical decision making of moderate complexity.

## 2014-05-13 NOTE — Patient Instructions (Signed)
Good to see you Gabapentin 100mg  at night. This will make you sleepy. Start with 1 pill for first week then 2 pills nightly thereafter.  Exercises 3 times a wee for shoulder and neck.  Ice 20 minutes 2 times daily. Usually after activity and before bed. Take tylenol 650 mg three times a day is the best evidence based medicine we have for arthritis. ONLY DO IF NOT TAKING PAIN MEDICINE Glucosamine sulfate 750mg  twice a day is a supplement that has been shown to help moderate to severe arthritis. Vitamin D 2000 IU daily Tumeric 500mg  twice daily.  Capsaicin topically up to four times a day may also help with pain.  I want to see you again in 2-3 weeks.

## 2014-05-13 NOTE — Assessment & Plan Note (Signed)
Patient does have a positive Spurling's bilaterally today. X-rays were ordered reviewed and interpreted by me today. 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  I am concerned the patient actually had an injury appears at C7-T1 that may have caused a injury to the shoulder girdle itself. Patient does have atrophy of the musculature of the shoulders bilaterally. At this time patient will try conservative therapy with over-the-counter medications, icing, home exercises. Patient continues to have difficulty though we need to consider formal physical therapy as well as further imaging for the possibility of a epidural steroid injection. Patient is in agreement with the plan.

## 2014-05-13 NOTE — Assessment & Plan Note (Signed)
Patient was given injection today with fairly good resolution of pain. Patient does have underlying osteophytic changes that I think is secondary to atrophy of the muscles girdle that could be secondary to nerve root impingement in the neck. Patient will try the home exercises at this time as well as topical medications. We discussed icing protocol. We need to avoid anti-inflammatories secondary to his other comorbidities. Patient will try these interventions and come back and see me in 3 weeks.

## 2014-05-17 ENCOUNTER — Encounter: Payer: Self-pay | Admitting: Internal Medicine

## 2014-05-18 ENCOUNTER — Other Ambulatory Visit: Payer: Self-pay | Admitting: Internal Medicine

## 2014-06-07 ENCOUNTER — Encounter: Payer: Self-pay | Admitting: Cardiovascular Disease

## 2014-06-07 ENCOUNTER — Ambulatory Visit (INDEPENDENT_AMBULATORY_CARE_PROVIDER_SITE_OTHER): Payer: 59 | Admitting: Cardiovascular Disease

## 2014-06-07 ENCOUNTER — Ambulatory Visit (INDEPENDENT_AMBULATORY_CARE_PROVIDER_SITE_OTHER): Payer: 59 | Admitting: Family Medicine

## 2014-06-07 ENCOUNTER — Encounter: Payer: Self-pay | Admitting: Family Medicine

## 2014-06-07 VITALS — BP 142/84 | HR 79 | Ht 75.0 in | Wt 336.0 lb

## 2014-06-07 VITALS — BP 130/92 | HR 79 | Ht 75.0 in | Wt 336.0 lb

## 2014-06-07 DIAGNOSIS — Z9989 Dependence on other enabling machines and devices: Secondary | ICD-10-CM

## 2014-06-07 DIAGNOSIS — M542 Cervicalgia: Secondary | ICD-10-CM

## 2014-06-07 DIAGNOSIS — I4891 Unspecified atrial fibrillation: Secondary | ICD-10-CM

## 2014-06-07 DIAGNOSIS — I10 Essential (primary) hypertension: Secondary | ICD-10-CM

## 2014-06-07 DIAGNOSIS — G4733 Obstructive sleep apnea (adult) (pediatric): Secondary | ICD-10-CM

## 2014-06-07 DIAGNOSIS — R0602 Shortness of breath: Secondary | ICD-10-CM

## 2014-06-07 NOTE — Assessment & Plan Note (Signed)
Maintaining normal sinus rhythm. No medication changes made 

## 2014-06-07 NOTE — Assessment & Plan Note (Signed)
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 Patient states at this time he is responding very well to the gabapentin and we will increasing to 3 times daily. In addition to this it seems that at night the helping his neuropathy as well. We discussed the over-the-counter medications again that could be beneficial and increase his B12 for his neuropathy. Patient will continue the home exercises for his neck and shoulders. Patient will come back and see me again in the 5-6 weeks for further evaluation and treatment. All questions were answered from patient as well as his wife. Spent greater than 25 minutes with patient face-to-face and had greater than 50% of counseling including as described above in assessment and plan.

## 2014-06-07 NOTE — Assessment & Plan Note (Signed)
Compliant on his CPAP 

## 2014-06-07 NOTE — Progress Notes (Signed)
[name] [address] [place], [place] [address] Phone: [phone] Subjective:     CC: Bilateral shoulder pain follow up [id number] Johnathan [name] is a 58 y.o. male coming in with complaint of bilateral shoulder pain left greater than right. There is concern patient was having more of a cervical radiculopathy. X-rays were ordered by me and showed patient had a diffuse severe degenerative changes of the cervical spine. Patient also has moderate degenerative changes of the shoulders bilaterally. Reviewed all pictures again today. At last visit we did do an injection into the left shoulder under ultrasound guidance. Patient was given home exercises for the shoulder as well as the neck. We discussed icing and over-the-counter medications that could be beneficial. Patient states he has been titrating up his gabapentin as well. Patient did not get all the over-the-counter medications but states he is taking gabapentin regularly. Patient states that he is approximately 50-60% better. Patient has been able to sleep in a bed which is the first of his been able to do that for the last year. Patient states that he has noticed certain activities have been a lot less stressful in strenuous that it had been previously. Patient states overall he is doing fairly well. Still continued some mild numbness in the left hand but otherwise everything seems to be getting better.     Past medical history, social, surgical and family history all reviewed in electronic medical record.   Review of Systems: No headache, visual changes, nausea, vomiting, diarrhea, constipation, dizziness, abdominal pain, skin rash, fevers, chills, night sweats, weight loss, swollen lymph nodes, body aches, joint swelling, muscle aches, chest pain, shortness of breath, mood changes.   Objective Blood pressure 142/84, pulse 79, height  (1.905 m), weight 336 lb (152.409 kg), SpO2 96.00%.  General: No  apparent distress alert and oriented x3 mood and affect normal, dressed appropriately. Obese HEENT: Pupils equal, extraocular movements intact  Respiratory: Patient's speak in full sentences and does not appear short of breath  Cardiovascular: No lower extremity edema, non tender, no erythema  Skin: Warm dry intact with no signs of infection or rash on extremities or on axial skeleton.  Abdomen: Soft nontender  Neuro: Cranial nerves II through XII are intact, neurovascularly intact in all extremities with 2+ DTRs and 2+ pulses.  Lymph: No lymphadenopathy of posterior or anterior cervical chain or axillae bilaterally.  Gait normal with good balance and coordination.  MSK:  Non tender with full range of motion and good stability and symmetric strength and tone of  elbows, wrist, hip, knee and ankles bilaterally.  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 Positive Spurling's maneuver. Mild increase in range of motion but still mild restriction in all planes. Grip strength and sensation normal in bilateral hands Strength good C4 to T1 distribution No sensory change to C4 to T1 Negative Hoffman sign bilaterally Reflexes normal Shoulder: Bilateral Inspection reveals no abnormalities, atrophy or asymmetry. Palpation is normal with no tenderness over AC joint or bicipital groove. ROM is full in all planes but severe crepitus left greater than right Rotator cuff strength mild weakness but symmetric signs of impingement with positive Neer and Hawkin's tests, empty can sign. Speeds and Yergason's tests normal. No labral pathology noted with negative Obrien's, negative clunk and good stability. Normal scapular function observed. No painful arc and no drop arm sign. No apprehension sign  Impression and Recommendations:     This case required medical decision making of moderate complexity.

## 2014-06-07 NOTE — Assessment & Plan Note (Signed)
No complaints of shortness of breath on today's visit. This seemed to improve with Lasix when necessary

## 2014-06-07 NOTE — Patient Instructions (Signed)
You are doing well. No medication changes were made.  Please continue to take lasix as needed for shortness of breath  Please call us if you have new issues that need to be addressed before your next appt.  Your physician wants you to follow-up in: 6 months.  You will receive a reminder letter in the mail two months in advance. If you don't receive a letter, please call our office to schedule the follow-up appointment.

## 2014-06-07 NOTE — Progress Notes (Signed)
Patient ID: Johnathan [name], male    DOB: 29-Jul-1956, 58 y.o.   MRN: [id number]  HPI Comments: 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  Overall he reports that he is doing well. Shoulder pain has improved on gabapentin twice a day. He continues to work long hours on an [employer], lifting heavy boxes He lifts boxes 300-400 times per day, less than 50 pounds. Severe pain in the beginning of the day, then warms up with no symptoms, severe cramping late in the evening, better if he stays active in the evening. Overall is sleeping better, now sleeping in a bed. Better shoulder pain has allowed him to sleep in a supine position, not in the recliner. Sometimes when very hot, feels that he has tachycardia. This improves as he cools down He does drink significant fluids daily On his prior clinic visit he had shortness of breath. This seemed to improve with Lasix when necessary  He is compliant on his CPAP.  He does not do any regular exercise walking apart from work. He denies any significant chest pain with exertion.  In the hospital 05/31/2012 with left-sided chest pain and palpitations. He was given adenosine in the emergency room that showed atrial fibrillation. He received Cardizem and started on diltiazem infusion and converted to normal sinus rhythm overnight. He was started on anticoagulation, continued on long-acting Cardizem and was discharged after echocardiogram showed normal LV function.  Echocardiogram showed normal LV function with ejection fraction 50-55%, normal right ventricular systolic pressure, normal left atrium TSH 1.7  Event monitor was performed September 16 2 07/25/2012 that showed normal sinus rhythm with PVCs, no  significant atrial fibrillation Blood pressure at home has been labile, typically in the 130-140 systolic range.   EKG today shows normal sinus rhythm with rate 82 beats per minute, no significant ST or T wave changes, with PVCs   Outpatient Encounter Prescriptions as of 06/07/2014  Medication Sig  . aspirin 81 MG tablet Take 81 mg by mouth daily.  . B Complex Vitamins (B COMPLEX 50 PO) Take by mouth daily.  Marland Kitchen buPROPion (WELLBUTRIN XL) 300 MG 24 hr tablet Take 1 tablet (300 mg total) by mouth daily.  . Cholecalciferol (VITAMIN D3) 2000 UNITS TABS Take by mouth daily.  Marland Kitchen diltiazem (DILACOR XR) 240 MG 24 hr capsule Take 1 capsule (240 mg total) by mouth daily.  Marland Kitchen FERROUS GLUCONATE PO Take by mouth daily.  . furosemide (LASIX) 20 MG tablet Take 1 tablet (20 mg total) by mouth 2 (two) times daily as needed.  . gabapentin (NEURONTIN) 100 MG capsule Take 1 capsule (100 mg total) by mouth 3 (three) times daily.  Marland Kitchen GLUCOSAMINE HCL-MSM PO Take by mouth daily.  Marland Kitchen HYDROcodone-acetaminophen (NORCO) 10-325 MG per tablet Take 1 tablet by mouth every 6 (six) hours as needed.  Marland Kitchen losartan-hydrochlorothiazide (HYZAAR) 50-12.5 MG per tablet Take 1 tablet by mouth  daily  . meloxicam (MOBIC) 15 MG tablet Take 1 tablet by mouth  every day  . metoprolol succinate (TOPROL-XL) 50 MG 24 hr tablet Take 1 tablet (50 mg total) by mouth daily. Take with or immediately following a meal.  . Multiple Vitamin (MULTIVITAMIN) tablet Take 1 tablet by mouth daily.  . potassium chloride (K-DUR) 10 MEQ tablet Take 1 tablet (10  mEq total) by mouth 2 (two) times daily as needed.  . propranolol (INDERAL) 20 MG tablet Take 1 tablet (20 mg total) by mouth 3 (three) times daily as needed.  . SAW PALMETTO-PHYTOSTEROLS PO Take 320 mg by mouth daily.  . vitamin B-12 (CYANOCOBALAMIN) 500 MCG tablet Take 500 mcg by mouth daily.  . vitamin E 400 UNIT capsule Take 400 Units by mouth daily.    Review of Systems  Constitutional: Negative.    HENT: Negative.   Eyes: Negative.   Respiratory: Negative.   Cardiovascular: Negative.   Gastrointestinal: Negative.   Endocrine: Negative.   Musculoskeletal: Positive for arthralgias, gait problem and joint swelling.  Skin: Negative.   Allergic/Immunologic: Negative.   Neurological: Negative.   Hematological: Negative.   Psychiatric/Behavioral: Negative.   All other systems reviewed and are negative.   BP 130/92  Pulse 79  Ht  (1.905 m)  Wt 336 lb (152.409 kg)  BMI 42.00 kg/m2  Physical Exam  Nursing note and vitals reviewed. Constitutional: He is oriented to person, place, and time. He appears well-developed and well-nourished.  obese  HENT:  Head: Normocephalic.  Nose: Nose normal.  Mouth/Throat: Oropharynx is clear and moist.  Eyes: Conjunctivae are normal. Pupils are equal, round, and reactive to light.  Neck: Normal range of motion. Neck supple. No JVD present.  Cardiovascular: Normal rate, regular rhythm, S1 normal, S2 normal, normal heart sounds and intact distal pulses.  Exam reveals no gallop and no friction rub.   No murmur heard. Trace to 1+ pitting edema on the right lower extremity, trace on the left  Pulmonary/Chest: Effort normal and breath sounds normal. No respiratory distress. He has no wheezes. He has no rales. He exhibits no tenderness.  Abdominal: Soft. Bowel sounds are normal. He exhibits no distension. There is no tenderness.  Musculoskeletal: Normal range of motion. He exhibits no edema and no tenderness.  Lymphadenopathy:    He has no cervical adenopathy.  Neurological: He is alert and oriented to person, place, and time. Coordination normal.  Skin: Skin is warm and dry. No rash noted. No erythema.  Psychiatric: He has a normal mood and affect. His behavior is normal. Judgment and thought content normal.      Assessment and Plan

## 2014-06-07 NOTE — Patient Instructions (Signed)
Good to see you and you are doing great.  Increase the gabapentin to 3 times daily.  Try the turmeric as well Increase B12 to daily.  Conitnue the exercises 3 times a week for another 6 weeks Check in again 5-6 weeks.

## 2014-06-07 NOTE — Assessment & Plan Note (Signed)
We have encouraged continued exercise, careful diet management in an effort to lose weight. 

## 2014-06-07 NOTE — Assessment & Plan Note (Signed)
Blood pressure is well controlled on today's visit. No changes made to the medications. 

## 2014-07-25 ENCOUNTER — Other Ambulatory Visit: Payer: Self-pay | Admitting: Internal Medicine

## 2014-07-31 ENCOUNTER — Encounter: Payer: Self-pay | Admitting: Internal Medicine

## 2014-07-31 ENCOUNTER — Ambulatory Visit (INDEPENDENT_AMBULATORY_CARE_PROVIDER_SITE_OTHER): Payer: 59 | Admitting: Internal Medicine

## 2014-07-31 ENCOUNTER — Ambulatory Visit (INDEPENDENT_AMBULATORY_CARE_PROVIDER_SITE_OTHER): Payer: 59 | Admitting: Family Medicine

## 2014-07-31 ENCOUNTER — Encounter: Payer: Self-pay | Admitting: Family Medicine

## 2014-07-31 VITALS — BP 136/72 | HR 56 | Ht 75.0 in | Wt 344.0 lb

## 2014-07-31 DIAGNOSIS — M159 Polyosteoarthritis, unspecified: Secondary | ICD-10-CM

## 2014-07-31 DIAGNOSIS — G629 Polyneuropathy, unspecified: Secondary | ICD-10-CM

## 2014-07-31 DIAGNOSIS — M542 Cervicalgia: Secondary | ICD-10-CM

## 2014-07-31 MED ORDER — HYDROCODONE-ACETAMINOPHEN 10-325 MG PO TABS
1.0000 | ORAL_TABLET | Freq: Four times a day (QID) | ORAL | Status: DC | PRN
Start: 2014-07-31 — End: 2014-11-06

## 2014-07-31 MED ORDER — GABAPENTIN 100 MG PO CAPS: 100.0000 mg | ORAL_CAPSULE | Freq: Three times a day (TID) | ORAL | Status: DC

## 2014-07-31 MED ORDER — HYDROCODONE-ACETAMINOPHEN 10-325 MG PO TABS: 1.0000 | ORAL_TABLET | Freq: Four times a day (QID) | ORAL | Status: DC | PRN

## 2014-07-31 NOTE — Assessment & Plan Note (Addendum)
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 Discuss when to be contributing to some of his neuropathy. We will monitor closely. Made adjustments to medications and discussed continuing the home exercises as well as traction that can be beneficial. Patient will continue to vitamins and supplementations. Patient they will come back as stated in patient instructions for further evaluation. Prescription of topical anti-inflammatories were given.

## 2014-07-31 NOTE — Progress Notes (Signed)
Patient ID: Johnathan [name], male   DOB: 04/09/1956, 58 y.o.   MRN: [id number]   Patient Active Problem List   Diagnosis Date Noted  . Severe obesity (BMI >= 40) 08/03/2014  . Neck pain 05/13/2014  . Rotator cuff insufficiency of left shoulder 05/13/2014  . Fatigue 05/06/2014  . SOB (shortness of breath) 05/06/2014  . OSA on CPAP 05/06/2014  . Unspecified essential hypertension 10/17/2013  . Polyarthritis of multiple sites 07/04/2013  . Neuropathy 07/04/2013  . S/P bariatric surgery 07/04/2013  . Other malaise and fatigue 07/10/2012  . Obesity, Class III, BMI 40-49.9 (morbid obesity) 07/10/2012  . 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 A-fib 06/19/2012  . Palpitations 06/19/2012  . Shortness of breath 06/19/2012    Subjective:  CC:   Chief Complaint  Patient presents with  . Follow-up    medication    HPI:   Johnathan Dawesdward A Seabrook is a 58 y.o. male who presents for  Follow up on chronic conditions, including chronic pain secondary to DJD involving both knees,  Hips and lower back ,  Complicated by morbid obesity. His  Health insurance premium is about to be raised because his BMI is over 30 and he is requesting an appeal . He has not been dieting .  He cannot exercise due to his joint pain.   He has been getting a lot of numbness in  hands and feet,  Saw Dr Katrinka BlazingSmith today and gabapentin increased to 200 mg at bedtime  Added a topical ointment for pulled latissmus/oblique.    Both sides of right thigh have been cramping up at night if he falls asleep on the left side,  And vice versa    Past Medical History  Diagnosis Date  . Hypertension   . B12 deficiency   . Arthritis   . Obesity   . Arrhythmia     A-fib  . History of DVT (deep vein thrombosis)     s/p bariatric surgery    Past Surgical History  Procedure Laterality Date  . Bariatric surgery  2004    Rou en Y   . Arthroscopic repair acl  1993    bilateral knee   . Cholecystectomy  1982  . Meniscus debridement  2011    Left knee       The following portions of the patient's history were reviewed and updated as appropriate: Allergies, current medications, and problem list.    Review of Systems:   Patient denies headache, fevers, malaise, unintentional weight loss, skin rash, eye pain, sinus congestion and sinus pain, sore throat, dysphagia,  hemoptysis , cough, dyspnea, wheezing, chest pain, palpitations, orthopnea, edema, abdominal pain, nausea, melena, diarrhea, constipation, flank pain, dysuria, hematuria, urinary  Frequency, nocturia, numbness, tingling, seizures,  Focal weakness, Loss of consciousness,  Tremor, insomnia, depression, anxiety, and suicidal ideation.     History   Social History  . Marital Status: Married    Spouse Name: N/A    Number of Children: N/A  . Years of Education: N/A   Occupational History  . Not on file.   Social History Main Topics  . Smoking status: Never Smoker   . Smokeless tobacco: Never Used  . Alcohol Use: No  . Drug Use: No  . Sexual Activity: Not on file   Other Topics Concern  . Not on file   Social History Narrative  . No narrative on file    Objective:  Filed Vitals:  07/31/14 1650  BP: 150/84  Pulse: 70  Temp: 98 F (36.7 C)  Resp: 16     General appearance: alert, cooperative and appears stated age Ears: normal TM's and external ear canals both ears Throat: lips, mucosa, and tongue normal; teeth and gums normal Neck: no adenopathy, no carotid bruit, supple, symmetrical, trachea midline and thyroid not enlarged, symmetric, no tenderness/mass/nodules Back: symmetric, no curvature. ROM normal. No CVA tenderness. Lungs: clear to auscultation bilaterally Heart: regular rate and rhythm, S1, S2 normal, no murmur, click, rub or gallop Abdomen: soft, non-tender; bowel sounds normal; no masses,  no organomegaly Pulses: 2+ and symmetric Skin: Skin color, texture, turgor normal.  No rashes or lesions Lymph nodes: Cervical, supraclavicular, and axillary nodes normal.  Assessment and Plan:  Severe obesity (BMI >= 40) I have addressed  BMI and recommended wt loss of 10% of body weigh over the next 6 months using a low glycemic index diet and regular exercise a minimum of 5 days per week.    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 Continue meloxicam and gabapentin .    Updated Medication List Outpatient Encounter Prescriptions as of 07/31/2014  Medication Sig  . aspirin 81 MG tablet Take 81 mg by mouth daily.  . B Complex Vitamins (B COMPLEX 50 PO) Take by mouth daily.  Marland Kitchen. buPROPion (WELLBUTRIN XL) 300 MG 24 hr tablet Take 1 tablet by mouth  daily  . Cholecalciferol (VITAMIN D3) 2000 UNITS TABS Take by mouth daily.  Marland Kitchen. DILT-XR 240 MG 24 hr capsule Take 1 capsule by mouth  daily  . FERROUS GLUCONATE PO Take by mouth daily.  Marland Kitchen. gabapentin (NEURONTIN) 100 MG capsule Take 1 capsule (100 mg total) by mouth 3 (three) times daily.  Marland Kitchen. GLUCOSAMINE HCL-MSM PO Take by mouth daily.  Marland Kitchen. HYDROcodone-acetaminophen (NORCO) 10-325 MG per tablet Take 1 tablet by mouth every 6 (six) hours as needed.  Marland Kitchen. losartan-hydrochlorothiazide (HYZAAR) 50-12.5 MG per tablet Take 1 tablet by mouth  daily  . meloxicam (MOBIC) 15 MG tablet Take 1 tablet by mouth  every day  . metoprolol succinate (TOPROL-XL) 50 MG 24 hr tablet Take 1 tablet (50 mg total) by mouth daily. Take with or immediately following a meal.  . Multiple Vitamin (MULTIVITAMIN) tablet Take 1 tablet by mouth daily.  . propranolol (INDERAL) 20 MG tablet Take 1 tablet (20 mg total) by mouth 3 (three) times daily as needed.  . SAW PALMETTO-PHYTOSTEROLS PO Take 320 mg by mouth daily.  . vitamin B-12 (CYANOCOBALAMIN) 500 MCG tablet Take 500 mcg by mouth daily.  . vitamin E 400 UNIT capsule Take 400 Units by mouth daily.  . [DISCONTINUED] HYDROcodone-acetaminophen (NORCO) 10-325  MG per tablet Take 1 tablet by mouth every 6 (six) hours as needed.  . [DISCONTINUED] HYDROcodone-acetaminophen (NORCO) 10-325 MG per tablet Take 1 tablet by mouth every 6 (six) hours as needed.  . [DISCONTINUED] HYDROcodone-acetaminophen (NORCO) 10-325 MG per tablet Take 1 tablet by mouth every 6 (six) hours as needed.  . furosemide (LASIX) 20 MG tablet Take 1 tablet (20 mg total) by mouth 2 (two) times daily as needed.  . potassium chloride (K-DUR) 10 MEQ tablet Take 1 tablet (10 mEq total) by mouth 2 (two) times daily as needed.     No orders of the defined types were placed in this encounter.    Return in about 3 months (around 10/31/2014).

## 2014-07-31 NOTE — Progress Notes (Signed)
Pre-visit discussion using our clinic review tool. No additional management support is needed unless otherwise documented below in the visit note.  

## 2014-07-31 NOTE — Assessment & Plan Note (Signed)
The patient having some numbness in his hands I do think that likely it is more coming from his cervical radiculopathy. This is likely also is giving him his shoulder pain. Patient is on gabapentin and we'll increase his nightly dose 200 mg per continue 100 mg and morning as well as in the afternoon. We discussed continuing the over-the-counter medications. Patient continues to have some difficulty and neck his neck pain seems to be worsening we may need to consider more advanced imaging and then potentially an epidural steroid injection. We will watch and see if patient makes any improvement with conservative therapy and patient will followup again in 4-6 weeks.

## 2014-07-31 NOTE — Patient Instructions (Signed)
Good to see you Gabapentin 100mg  in AM, 100mg  in PM and 200mg  at night.  Ice bath of feet 20 minutes at night.  Wear the wrist brace day and night for 10 days then nightly for 10 days Alternate exercises daily of wrist and back.  If neck worsens we will do epidural  Try the pennsaid twice daily as needed for back anywhere else.  If it helps call and I will get you prescription.  Continue everything else.  See me again in 4 weeks.

## 2014-07-31 NOTE — Progress Notes (Signed)
  Johnathan ScaleZach Smith D.O. [facility] [address] [place], [place] [address] Phone: [phone] Subjective:     CC: Bilateral shoulder pain follow up BJY:NWGNFAOZHYHPI:Subjective Johnathan Dawesdward A [name] is a 58 y.o. male coming in with complaint of bilateral shoulder pain left greater than right. There is concern patient was having more of a cervical radiculopathy. X-rays were ordered by me and showed patient had a diffuse severe degenerative changes of the cervical spine. Patient also has moderate degenerative changes of the shoulders bilaterally. Patient has had intra-articular injections of the shoulders with some mild benefit previously. Patient has been doing more the home exercises, range of motion exercises as well as gabapentin 100 mg 3 times a day. Patient states that the neck pain in the shoulder pain seems to be somewhat better. Patient was having some numbness in his hands from time to time. She states that he has functional overall. Patient is able to work as well as do daily activities. Patient is happy with the results but is wondering if he can do better.    Past medical history, social, surgical and family history all reviewed in electronic medical record.   Review of Systems: No headache, visual changes, nausea, vomiting, diarrhea, constipation, dizziness, abdominal pain, skin rash, fevers, chills, night sweats, weight loss, swollen lymph nodes, body aches, joint swelling, muscle aches, chest pain, shortness of breath, mood changes.   Objective Blood pressure 136/72, pulse 56, height 6\' 3"  (1.905 m), weight 344 lb (156.037 kg), SpO2 96.00%.  General: No apparent distress alert and oriented x3 mood and affect normal, dressed appropriately. Obese HEENT: Pupils equal, extraocular movements intact  Respiratory: Patient's speak in full sentences and does not appear short of breath  Cardiovascular: No lower extremity edema, non tender, no erythema  Skin: Warm dry intact with no signs of infection  or rash on extremities or on axial skeleton.  Abdomen: Soft nontender  Neuro: Cranial nerves II through XII are intact, neurovascularly intact in all extremities with 2+ DTRs and 2+ pulses.  Lymph: No lymphadenopathy of posterior or anterior cervical chain or axillae bilaterally.  Gait normal with good balance and coordination.  MSK:  Non tender with full range of motion and good stability and symmetric strength and tone of  elbows, wrist, hip, knee and ankles bilaterally.  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 Positive Spurling's maneuver. Mild increase in range of motion but still mild restriction in all planes. Grip strength and sensation normal in bilateral hands Strength good C4 to T1 distribution No sensory change to C4 to T1 Negative Hoffman sign bilaterally Reflexes normal Shoulder: Bilateral Inspection reveals no abnormalities, atrophy or asymmetry. Palpation is normal with no tenderness over AC joint or bicipital groove. ROM is full in all planes but severe crepitus left greater than right Rotator cuff strength mild weakness but symmetric signs of impingement with positive Neer and Hawkin's tests, empty can sign. Speeds and Yergason's tests normal. No labral pathology noted with negative Obrien's, negative clunk and good stability. Normal scapular function observed. No painful arc and no drop arm sign. No apprehension sign  No significant change from previous exam.     Impression and Recommendations:     This case required medical decision making of moderate complexity.

## 2014-07-31 NOTE — Patient Instructions (Signed)
This is  my version of a  "Low GI"  Diet:  It will still lower your blood sugars and allow you to lose 4 to 8  lbs  per month if you follow it carefully.  Your goal with exercise is a minimum of 30 minutes of aerobic exercise 5 days per week (Walking does not count once it becomes easy!)   I want you to lose 12 lbs over gthe next 3 months and 48 lbs over the next year in order for me to sign the labcorp waiver    All of the foods can be found at grocery stores and in bulk at [employer].  The Atkins protein bars and shakes are available in more varieties at Target, WalMart and Lowe's Foods.     7 AM Breakfast:  Choose from the following:  Low carbohydrate Protein  Shakes (I recommend the EAS AdvantEdge "Carb Control" shakes  Or the low carb shakes by Atkins.    2.5 carbs   Arnold's "Sandwhich Thin"toasted  w/ peanut butter (no jelly: about 20 net carbs  "Bagel Thin" with cream cheese and salmon: about 20 carbs   a scrambled egg/bacon/cheese burrito made with Mission's "carb balance" whole wheat tortilla  (about 10 net carbs )  A slice of home made fritatta (egg based dish without a crust:  google it)    Avoid cereal and bananas, oatmeal and cream of wheat and grits. They are loaded with carbohydrates!   10 AM: high protein snack  Protein bar by Atkins (the snack size, under 200 cal, usually < 6 net carbs).    A stick of cheese:  Around 1 carb,  100 cal     Dannon Light n Fit AustriaGreek Yogurt  (80 cal, 8 carbs)  Other so called "protein bars" and Greek yogurts tend to be loaded with carbohydrates.  Remember, in food advertising, the word "energy" is synonymous for " carbohydrate."  Lunch:   A Sandwich using the bread choices listed, Can use any  Eggs,  lunchmeat, grilled meat or canned tuna), avocado, regular mayo/mustard  and cheese.  A Salad using blue cheese, ranch,  Goddess or vinagrette,  No croutons or "confetti" and no "candied nuts" but regular nuts OK.   No pretzels or chips.  Pickles  and miniature sweet peppers are a good low carb alternative that provide a "crunch"  The bread is the only source of carbohydrate in a sandwich and  can be decreased by trying some of these alternatives to traditional loaf bread  Joseph's makes a pita bread and a flat bread that are 50 cal and 4 net carbs available at BJs and WalMart.  This can be toasted to use with hummous as well  Toufayan makes a low carb flatbread that's 100 cal and 9 net carbs available at [employer] and [employer]  Mission makes 2 sizes of  Low carb whole wheat tortilla  (The large one is 210 cal and 6 net carbs)  Flat Out makes flatbreads that are low carb as well  Avoid "Low fat dressings, as well as Reyne DumasCatalina and [address] dressings They are loaded with sugar!   3 PM/ Mid day  Snack:  Consider  1 ounce of  almonds, walnuts, pistachios, pecans, peanuts,  Macadamia nuts or a nut medley.  Avoid "granola"; the dried cranberries and raisins are loaded with carbohydrates. Mixed nuts as long as there are no raisins,  cranberries or dried fruit.    Try the prosciutto/mozzarella  cheese sticks by Fiorruci  In deli /backery section   High protein   To avoid overindulging in snacks: Try drinking a glass of unsweeted almond/coconut milk  Or a cup of coffee with your Atkins chocolate bar to keep you from having 3!!!   Pork rinds!  Yes Pork Rinds        6 PM  Dinner:     Meat/fowl/fish with a green salad, and either broccoli, cauliflower, green beans, spinach, brussel sprouts or  Lima beans. DO NOT BREAD THE PROTEIN!!      There is a low carb pasta by Dreamfield's that is acceptable and tastes great: only 5 digestible carbs/serving.( All grocery stores but BJs carry it )  Try Kai LevinsMichel Angelo's chicken piccata or chicken or eggplant parm over low carb pasta.(Lowes and BJs)   Clifton CustardAaron Sanchez's "Carnitas" (pulled pork, no sauce,  0 carbs) or his beef pot roast to make a dinner burrito (at BJ's)  Pesto over low carb pasta (bj's sells a good  quality pesto in the center refrigerated section of the deli   Try satueeing  Roosvelt HarpsBok Choy with mushroooms  Whole wheat pasta is still full of digestible carbs and  Not as low in glycemic index as Dreamfield's.   Brown rice is still rice,  So skip the rice and noodles if you eat Congohinese or [place] Zealandhai (or at least limit to 1/2 cup)  9 PM snack :   Breyer's "low carb" fudgsicle or  ice cream bar (Carb Smart line), or  Weight Watcher's ice cream bar , or another "no sugar added" ice cream;  a serving of fresh berries/cherries with whipped cream   Cheese or DANNON'S LlGHT N FIT GREEK YOGURT or the Oikos greek yogurt   8 ounces of [place] Diamond unsweetened almond/cococunut milk    Avoid bananas, pineapple, grapes  and watermelon on a regular basis because they are high in sugar.  THINK OF THEM AS DESSERT  Remember that snack Substitutions should be less than 10 NET carbs per serving and meals < 20 carbs. Remember to subtract fiber grams to get the "net carbs."

## 2014-08-03 DIAGNOSIS — E66813 Obesity, class 3: Secondary | ICD-10-CM | POA: Insufficient documentation

## 2014-08-03 NOTE — Assessment & Plan Note (Signed)
Refill on vicodin given. Continue meloxicam and gabapentin .

## 2014-08-03 NOTE — Assessment & Plan Note (Signed)
I have addressed  BMI and recommended wt loss of 10% of body weigh over the next 6 months using a low glycemic index diet and regular exercise a minimum of 5 days per week.   

## 2014-08-27 ENCOUNTER — Other Ambulatory Visit: Payer: Self-pay

## 2014-08-27 MED ORDER — PROPRANOLOL HCL 20 MG PO TABS: 20.0000 mg | ORAL_TABLET | Freq: Three times a day (TID) | ORAL | Status: DC | PRN

## 2014-09-10 ENCOUNTER — Ambulatory Visit: Payer: 59 | Admitting: Family Medicine

## 2014-10-15 ENCOUNTER — Other Ambulatory Visit: Payer: Self-pay | Admitting: Cardiovascular Disease

## 2014-11-01 ENCOUNTER — Ambulatory Visit: Payer: 59 | Admitting: Internal Medicine

## 2014-11-05 ENCOUNTER — Other Ambulatory Visit: Payer: Self-pay | Admitting: Internal Medicine

## 2014-11-05 NOTE — Telephone Encounter (Signed)
Last OV 10.21.15.  Pt has OV on 1.27.16 with Naomie Deanarrie Doss.  Please advise refill

## 2014-11-06 ENCOUNTER — Ambulatory Visit (INDEPENDENT_AMBULATORY_CARE_PROVIDER_SITE_OTHER): Payer: Commercial Managed Care - PPO | Admitting: Nurse Practitioner

## 2014-11-06 ENCOUNTER — Encounter: Payer: Self-pay | Admitting: Nurse Practitioner

## 2014-11-06 VITALS — BP 136/78 | HR 57 | Temp 98.1°F | Resp 16 | Ht 75.0 in | Wt 350.4 lb

## 2014-11-06 DIAGNOSIS — M654 Radial styloid tenosynovitis [de Quervain]: Secondary | ICD-10-CM

## 2014-11-06 DIAGNOSIS — M064 Inflammatory polyarthropathy: Secondary | ICD-10-CM

## 2014-11-06 DIAGNOSIS — M130 Polyarthritis, unspecified: Secondary | ICD-10-CM

## 2014-11-06 DIAGNOSIS — Z0000 Encounter for general adult medical examination without abnormal findings: Secondary | ICD-10-CM

## 2014-11-06 MED ORDER — HYDROCODONE-ACETAMINOPHEN 10-325 MG PO TABS: 1.0000 | ORAL_TABLET | Freq: Four times a day (QID) | ORAL | Status: DC | PRN

## 2014-11-06 NOTE — Telephone Encounter (Signed)
90 day supply authorized and sent   

## 2014-11-06 NOTE — Progress Notes (Signed)
Subjective:    Patient ID: Johnathan [name], male    DOB: 09/26/1956, 59 y.o.   MRN: [id number]  HPI  Johnathan [name] is a 59 yo male with a CC of needing a physical and requesting refill of Vicodin.   1) Health Maintenance-   Diet- Cooks at home 90%, Cut out aspartame and most sugar  Exercise- Walking, stationary bike 2 x week for 30 min  Immunizations- Postpone flu shot - refused this year  Colonoscopy- 2013, good for 10 years  PSA- Not recommended at this time  Eye Exam- Not up to date  Dental Exam- Up to date  Safety  Patient feesl safe at home.  Patient does have smoke detectors at home  Patient does wear sunscreen or protective clothing when in direct sunlight  Patient does wear seat belt when driving or riding with others.  2) Chronic Problems-  A-fib- Propanolol helpful, noticing more recently, Due to see Dr. Mariah Milling May  HTN- Checks at home, no concerns  OSA- not wearing CPAP, no issues at time   Sees Dr. Katrinka Blazing for neuropathy and rotator cuff issues left shoulder, and DJD of multiple sites. Last dose was this morning of Norco- takes 4 x daily. Pt is doing exercises as prescribed by Dr. Katrinka Blazing daily. Worse in morning.   Neuropathy- Suspect it is from nerve damage, burning, numbness in feet more constant now, Gabapentin 100 mg 4 x a day.   Not taking Lasix or Potassium   Review of Systems  Constitutional: Negative for fever, chills, diaphoresis, fatigue and unexpected weight change.  HENT: Negative for tinnitus and trouble swallowing.   Eyes: Negative for visual disturbance.  Respiratory: Negative for cough, chest tightness, shortness of breath and wheezing.   Cardiovascular: Positive for palpitations. Negative for chest pain and leg swelling.  Gastrointestinal: Negative for nausea, vomiting, abdominal pain, diarrhea, constipation, blood in stool and abdominal distention.  Genitourinary: Negative for difficulty urinating.  Musculoskeletal: Positive for myalgias and  arthralgias. Negative for joint swelling and gait problem.  Skin: Negative for rash.  Neurological: Positive for numbness. Negative for dizziness, weakness and headaches.  Hematological: Does not bruise/bleed easily.  Psychiatric/Behavioral: Negative for suicidal ideas and sleep disturbance.       Positive for seasonal depression, anxiety from job related stressors.    Past Medical History  Diagnosis Date  . Hypertension   . B12 deficiency   . Arthritis   . Obesity   . Arrhythmia     A-fib  . History of DVT (deep vein thrombosis)     s/p bariatric surgery    History   Social History  . Marital Status: Married    Spouse Name: N/A    Number of Children: N/A  . Years of Education: N/A   Occupational History  . Not on file.   Social History Main Topics  . Smoking status: Never Smoker   . Smokeless tobacco: Never Used  . Alcohol Use: 1.2 oz/week    2 Shots of liquor per week     Comment: Vodka and lemonade occasionally 2-3/week   . Drug Use: No  . Sexual Activity:    Partners: Female     Comment: Wife   Other Topics Concern  . Not on file   Social History Narrative   Currently between jobs   Lives with Wife   1 Son (33) and 1 daughter (70)    2 grandchildren by daughter    1 dog and 1 cat  Enjoys gardening, movies, dining out.     Past Surgical History  Procedure Laterality Date  . Bariatric surgery  2004    Rou en Y   . Arthroscopic repair acl  1993    bilateral knee  . Cholecystectomy  1982  . Meniscus debridement  2011    Left knee    No family history on file.  No Known Allergies  Current Outpatient Prescriptions on File Prior to Visit  Medication Sig Dispense Refill  . aspirin 81 MG tablet Take 81 mg by mouth daily.    . B Complex Vitamins (B COMPLEX 50 PO) Take by mouth daily.    Marland Kitchen. buPROPion (WELLBUTRIN XL) 300 MG 24 hr tablet Take 1 tablet by mouth  daily 90 tablet 1  . Cholecalciferol (VITAMIN D3) 2000 UNITS TABS Take by mouth daily.    Marland Kitchen.  DILT-XR 240 MG 24 hr capsule Take 1 capsule by mouth  daily 90 capsule 1  . FERROUS GLUCONATE PO Take by mouth daily.    . furosemide (LASIX) 20 MG tablet Take 1 tablet (20 mg total) by mouth 2 (two) times daily as needed. 60 tablet 3  . gabapentin (NEURONTIN) 100 MG capsule Take 1 capsule (100 mg total) by mouth 3 (three) times daily. 270 capsule 3  . GLUCOSAMINE HCL-MSM PO Take by mouth daily.    Marland Kitchen. losartan-hydrochlorothiazide (HYZAAR) 50-12.5 MG per tablet Take 1 tablet by mouth  daily 90 tablet 1  . meloxicam (MOBIC) 15 MG tablet Take 1 tablet by mouth  every day 90 tablet 2  . metoprolol succinate (TOPROL-XL) 50 MG 24 hr tablet Take 1 tablet (50 mg total) by mouth daily. Take with or immediately following a meal. 90 tablet 3  . Multiple Vitamin (MULTIVITAMIN) tablet Take 1 tablet by mouth daily.    . potassium chloride (K-DUR) 10 MEQ tablet Take 1 tablet (10 mEq total) by mouth 2 (two) times daily as needed. 60 tablet 3  . propranolol (INDERAL) 20 MG tablet Take 1 tablet by mouth 3  times daily as needed 270 tablet 3  . SAW PALMETTO-PHYTOSTEROLS PO Take 320 mg by mouth daily.    . vitamin B-12 (CYANOCOBALAMIN) 500 MCG tablet Take 500 mcg by mouth daily.    . vitamin E 400 UNIT capsule Take 400 Units by mouth daily.     No current facility-administered medications on file prior to visit.       Objective:   Physical Exam  Constitutional: He is oriented to person, place, and time. He appears well-developed and well-nourished. No distress.  BP 136/78 mmHg  Pulse 57  Temp(Src) 98.1 F (36.7 C) (Oral)  Resp 16  Ht 6\' 3"  (1.905 m)  Wt 350 lb 6.4 oz (158.94 kg)  BMI 43.80 kg/m2  SpO2 95%   HENT:  Head: Normocephalic and atraumatic.  Right Ear: External ear normal.  Left Ear: External ear normal.  Nose: Nose normal.  Mouth/Throat: Oropharynx is clear and moist. No oropharyngeal exudate.  Eyes: Conjunctivae and EOM are normal. Pupils are equal, round, and reactive to light. Right eye  exhibits no discharge. Left eye exhibits no discharge. No scleral icterus.  Neck: Normal range of motion. Neck supple. No thyromegaly present.  Cardiovascular: Normal rate, regular rhythm, normal heart sounds and intact distal pulses.  Exam reveals no gallop and no friction rub.   No murmur heard. Pulmonary/Chest: Effort normal and breath sounds normal. No respiratory distress. He has no wheezes. He has no rales. He exhibits  no tenderness.  Abdominal: Soft. Bowel sounds are normal. He exhibits no distension and no mass. There is no tenderness. There is no rebound and no guarding.  Musculoskeletal: He exhibits tenderness. He exhibits no edema.  Lymphadenopathy:    He has no cervical adenopathy.  Neurological: He is alert and oriented to person, place, and time. He displays normal reflexes. No cranial nerve deficit. He exhibits normal muscle tone. Coordination normal.  Skin: Skin is warm and dry. No rash noted. He is not diaphoretic.  Psychiatric: He has a normal mood and affect. His behavior is normal. Judgment and thought content normal.      Assessment & Plan:

## 2014-11-06 NOTE — Progress Notes (Signed)
Pre visit review using our clinic review tool, if applicable. No additional management support is needed unless otherwise documented below in the visit note. 

## 2014-11-06 NOTE — Patient Instructions (Addendum)
Health Maintenance A healthy lifestyle and preventative care can promote health and wellness.  Maintain regular health, dental, and eye exams.  Eat a healthy diet. Foods like vegetables, fruits, whole grains, low-fat dairy products, and lean protein foods contain the nutrients you need and are low in calories. Decrease your intake of foods high in solid fats, added sugars, and salt. Get information about a proper diet from your health care provider, if necessary.  Regular physical exercise is one of the most important things you can do for your health. Most adults should get at least 150 minutes of moderate-intensity exercise (any activity that increases your heart rate and causes you to sweat) each week. In addition, most adults need muscle-strengthening exercises on 2 or more days a week.   Maintain a healthy weight. The body mass index (BMI) is a screening tool to identify possible weight problems. It provides an estimate of body fat based on height and weight. Your health care provider can find your BMI and can help you achieve or maintain a healthy weight. For males 20 years and older:  A BMI below 18.5 is considered underweight.  A BMI of 18.5 to 24.9 is normal.  A BMI of 25 to 29.9 is considered overweight.  A BMI of 30 and above is considered obese.  Maintain normal blood lipids and cholesterol by exercising and minimizing your intake of saturated fat. Eat a balanced diet with plenty of fruits and vegetables. Blood tests for lipids and cholesterol should begin at age 20 and be repeated every 5 years. If your lipid or cholesterol levels are high, you are over age 50, or you are at high risk for heart disease, you may need your cholesterol levels checked more frequently.Ongoing high lipid and cholesterol levels should be treated with medicines if diet and exercise are not working.  If you smoke, find out from your health care provider how to quit. If you do not use tobacco, do not  start.  Lung cancer screening is recommended for adults aged 55-80 years who are at high risk for developing lung cancer because of a history of smoking. A yearly low-dose CT scan of the lungs is recommended for people who have at least a 30-pack-year history of smoking and are current smokers or have quit within the past 15 years. A pack year of smoking is smoking an average of 1 pack of cigarettes a day for 1 year (for example, a 30-pack-year history of smoking could mean smoking 1 pack a day for 30 years or 2 packs a day for 15 years). Yearly screening should continue until the smoker has stopped smoking for at least 15 years. Yearly screening should be stopped for people who develop a health problem that would prevent them from having lung cancer treatment.  If you choose to drink alcohol, do not have more than 2 drinks per day. One drink is considered to be 12 oz (360 mL) of beer, 5 oz (150 mL) of wine, or 1.5 oz (45 mL) of liquor.  Avoid the use of street drugs. Do not share needles with anyone. Ask for help if you need support or instructions about stopping the use of drugs.  High blood pressure causes heart disease and increases the risk of stroke. Blood pressure should be checked at least every 1-2 years. Ongoing high blood pressure should be treated with medicines if weight loss and exercise are not effective.  If you are 45-79 years old, ask your health care provider if   you should take aspirin to prevent heart disease.  Diabetes screening involves taking a blood sample to check your fasting blood sugar level. This should be done once every 3 years after age 45 if you are at a normal weight and without risk factors for diabetes. Testing should be considered at a younger age or be carried out more frequently if you are overweight and have at least 1 risk factor for diabetes.  Colorectal cancer can be detected and often prevented. Most routine colorectal cancer screening begins at the age of 50  and continues through age 75. However, your health care provider may recommend screening at an earlier age if you have risk factors for colon cancer. On a yearly basis, your health care provider may provide home test kits to check for hidden blood in the stool. A small camera at the end of a tube may be used to directly examine the colon (sigmoidoscopy or colonoscopy) to detect the earliest forms of colorectal cancer. Talk to your health care provider about this at age 50 when routine screening begins. A direct exam of the colon should be repeated every 5-10 years through age 75, unless early forms of precancerous polyps or small growths are found.  People who are at an increased risk for hepatitis B should be screened for this virus. You are considered at high risk for hepatitis B if:  You were born in a country where hepatitis B occurs often. Talk with your health care provider about which countries are considered high risk.  Your parents were born in a high-risk country and you have not received a shot to protect against hepatitis B (hepatitis B vaccine).  You have HIV or AIDS.  You use needles to inject street drugs.  You live with, or have sex with, someone who has hepatitis B.  You are a man who has sex with other men (MSM).  You get hemodialysis treatment.  You take certain medicines for conditions like cancer, organ transplantation, and autoimmune conditions.  Hepatitis C blood testing is recommended for all people born from 1945 through 1965 and any individual with known risk factors for hepatitis C.  Healthy men should no longer receive prostate-specific antigen (PSA) blood tests as part of routine cancer screening. Talk to your health care provider about prostate cancer screening.  Testicular cancer screening is not recommended for adolescents or adult males who have no symptoms. Screening includes self-exam, a health care provider exam, and other screening tests. Consult with your  health care provider about any symptoms you have or any concerns you have about testicular cancer.  Practice safe sex. Use condoms and avoid high-risk sexual practices to reduce the spread of sexually transmitted infections (STIs).  You should be screened for STIs, including gonorrhea and chlamydia if:  You are sexually active and are younger than 24 years.  You are older than 24 years, and your health care provider tells you that you are at risk for this type of infection.  Your sexual activity has changed since you were last screened, and you are at an increased risk for chlamydia or gonorrhea. Ask your health care provider if you are at risk.  If you are at risk of being infected with HIV, it is recommended that you take a prescription medicine daily to prevent HIV infection. This is called pre-exposure prophylaxis (PrEP). You are considered at risk if:  You are a man who has sex with other men (MSM).  You are a heterosexual man who   is sexually active with multiple partners.  You take drugs by injection.  You are sexually active with a partner who has HIV.  Talk with your health care provider about whether you are at high risk of being infected with HIV. If you choose to begin PrEP, you should first be tested for HIV. You should then be tested every 3 months for as long as you are taking PrEP.  Use sunscreen. Apply sunscreen liberally and repeatedly throughout the day. You should seek shade when your shadow is shorter than you. Protect yourself by wearing long sleeves, pants, a wide-brimmed hat, and sunglasses year round whenever you are outdoors.  Tell your health care provider of new moles or changes in moles, especially if there is a change in shape or color. Also, tell your health care provider if a mole is larger than the size of a pencil eraser.  A one-time screening for abdominal aortic aneurysm (AAA) and surgical repair of large AAAs by ultrasound is recommended for men aged  65-75 years who are current or former smokers.  Stay current with your vaccines (immunizations). Document Released: 03/25/2008 Document Revised: 10/02/2013 Document Reviewed: 02/22/2011 [facility] Patient Information 2015 [place], [place]. This information is not intended to replace advice given to you by your health care provider. Make sure you discuss any questions you have with your health care provider.   De Quervain's Disease Suzette BattiestDe Quervain's disease is a condition often seen in racquet sports where there is a soreness (inflammation) in the cord like structures (tendons) which attach muscle to bone on the thumb side of the wrist. There may be a tightening of the tissuesaround the tendons. This condition is often helped by giving up or modifying the activity which caused it. When conservative treatment does not help, surgery may be required. Conservative treatment could include changes in the activity which brought about the problem or made it worse. Anti-inflammatory medications and injections may be used to help decrease the inflammation and help with pain control. Your caregiver will help you determine which is best for you. DIAGNOSIS  Often the diagnosis (learning what is wrong) can be made by examination. Sometimes x-rays are required. HOME CARE INSTRUCTIONS   Apply ice to the sore area for 15-20 minutes, 03-04 times per day while awake. Put the ice in a plastic bag and place a towel between the bag of ice and your skin. This is especially helpful if it can be done after all activities involving the sore wrist.  Temporary splinting may help.  Only take over-the-counter or prescription medicines for pain, discomfort or fever as directed by your caregiver. SEEK MEDICAL CARE IF:   Pain relief is not obtained with medications, or if you have increasing pain and seem to be getting worse rather than better. MAKE SURE YOU:   Understand these instructions.  Will watch your condition.  Will get  help right away if you are not doing well or get worse. Document Released: 06/22/2001 Document Revised: 12/20/2011 Document Reviewed: 01/30/2014 [facility] Patient Information 2015 [place], [place]. This information is not intended to replace advice given to you by your health care provider. Make sure you discuss any questions you have with your health care provider.

## 2014-11-07 LAB — CBC AND DIFFERENTIAL
HCT: 44 % (ref 41–53)
Hemoglobin: 14.8 g/dL (ref 13.5–17.5)
Platelets: 143 10*3/uL — AB (ref 150–399)
WBC: 7.5 10^3/mL

## 2014-11-07 LAB — BASIC METABOLIC PANEL: BUN: 21 mg/dL (ref 4–21)

## 2014-11-08 DIAGNOSIS — M654 Radial styloid tenosynovitis [de Quervain]: Secondary | ICD-10-CM | POA: Insufficient documentation

## 2014-11-08 DIAGNOSIS — Z0000 Encounter for general adult medical examination without abnormal findings: Secondary | ICD-10-CM | POA: Insufficient documentation

## 2014-11-08 NOTE — Assessment & Plan Note (Signed)
Stable. Pt here for physical exam. Health maintenance measures reviewed, PFSHx reviewed, immunizations, current and chronic problems. Lab work: Obtain from [facility] cmet, cbc, a1c, lipid. Refilled medications as needed. Visit to be scheduled for next year.

## 2014-11-08 NOTE — Assessment & Plan Note (Signed)
Worsening. De Quervain's suspected in right wrist. Positive finkelsteins. Pt works with screws and wrenches daily. Pt given handout with information. He already has a wrist brace. FU prn.

## 2014-11-08 NOTE — Assessment & Plan Note (Addendum)
Rx for Hydrocodone-acetaminophen 10-325 mg 30 day supply x 3 months with listed fill by date on each. Controlled substance contract signed and I okayed filling with Dr. Darrick Huntsmanullo.

## 2014-11-20 ENCOUNTER — Encounter: Payer: Self-pay | Admitting: Nurse Practitioner

## 2014-12-10 ENCOUNTER — Telehealth: Payer: Self-pay | Admitting: *Deleted

## 2014-12-10 MED ORDER — HYDROCODONE-ACETAMINOPHEN 10-325 MG PO TABS: 1.0000 | ORAL_TABLET | Freq: Four times a day (QID) | ORAL | Status: DC | PRN

## 2014-12-10 NOTE — Telephone Encounter (Addendum)
Pt called states his Vicodin Rx that was written to be filled now was written with the incorrect quantity.  States this Rx was written #30 instead of #120.  Further states the original Rx is at the pharmacy.  Pt is requesting Rx be rewritten with the correct quantity and says it may be given to his wife,Katherine Meadors at her appoint today.  I called the pharmacy, the Rx for #30 has been filled however it has not been picked up by the patient yet.

## 2014-12-10 NOTE — Telephone Encounter (Signed)
Corrected rx printed and given to wife

## 2014-12-13 ENCOUNTER — Other Ambulatory Visit: Payer: Self-pay | Admitting: Internal Medicine

## 2014-12-23 ENCOUNTER — Telehealth: Payer: Self-pay

## 2014-12-23 NOTE — Telephone Encounter (Signed)
Left message for pt to call back  °

## 2014-12-23 NOTE — Telephone Encounter (Signed)
Pt c/o Shortness Of Breath: STAT if SOB developed within the last 24 hours or pt is noticeably SOB on the phone  1. Are you currently SOB (can you hear that pt is SOB on the phone)? Yes,   2. How long have you been experiencing SOB? 4 weeks  3. Are you SOB when sitting or when up moving around? All the time  4. Are you currently experiencing any other symptoms? Heart racing, HR 129, BP 97/64

## 2014-12-23 NOTE — Telephone Encounter (Signed)
Spoke w/ pt.  Asked pt about his sx and our call was disconnected.

## 2014-12-23 NOTE — Telephone Encounter (Signed)
Spoke w/ pt.  He reports that he has had increasing episodes of elevated HR in the 110-115 range and SOBOE. He BP monitor shows irregular HR, BP 120-130/85-85.  Reports that he takes propranolol as directed, but "it doesn't seem to be working as well anymore". Reports a tightening around the back of his neck and shoulders during these episodes.  He works part time and states that sx have become worse of the past several weeks.  Reports that during his last episode, he was at work and it took him about 30 mins to start to feel better.  Advised pt that if he becomes symptomatic, to call EMS and they can perform an EKG and either treat him or take him to the ED, if needed.  Pt is sched to see Dr. Mariah MillingGollan 01/08/15, but states that he can be here anytime at short notice if there is an opening.  Asked him to call if sx return and we can possibly try to work him in for a nurse visit.

## 2015-01-08 ENCOUNTER — Encounter: Payer: Self-pay | Admitting: Cardiovascular Disease

## 2015-01-08 ENCOUNTER — Ambulatory Visit (INDEPENDENT_AMBULATORY_CARE_PROVIDER_SITE_OTHER): Payer: 59 | Admitting: Cardiovascular Disease

## 2015-01-08 VITALS — BP 132/60 | HR 68 | Ht 74.0 in | Wt 349.2 lb

## 2015-01-08 DIAGNOSIS — I5033 Acute on chronic diastolic (congestive) heart failure: Secondary | ICD-10-CM

## 2015-01-08 DIAGNOSIS — R0602 Shortness of breath: Secondary | ICD-10-CM

## 2015-01-08 DIAGNOSIS — Z9989 Dependence on other enabling machines and devices: Secondary | ICD-10-CM

## 2015-01-08 DIAGNOSIS — G4733 Obstructive sleep apnea (adult) (pediatric): Secondary | ICD-10-CM

## 2015-01-08 DIAGNOSIS — R079 Chest pain, unspecified: Secondary | ICD-10-CM | POA: Diagnosis not present

## 2015-01-08 DIAGNOSIS — E66813 Obesity, class 3: Secondary | ICD-10-CM

## 2015-01-08 DIAGNOSIS — I4891 Unspecified atrial fibrillation: Secondary | ICD-10-CM | POA: Diagnosis not present

## 2015-01-08 MED ORDER — METOPROLOL SUCCINATE ER 50 MG PO TB24: 50.0000 mg | ORAL_TABLET | Freq: Every day | ORAL | Status: DC

## 2015-01-08 MED ORDER — FUROSEMIDE 20 MG PO TABS: 20.0000 mg | ORAL_TABLET | Freq: Two times a day (BID) | ORAL | Status: DC | PRN

## 2015-01-08 MED ORDER — POTASSIUM CHLORIDE ER 10 MEQ PO TBCR: 10.0000 meq | EXTENDED_RELEASE_TABLET | Freq: Two times a day (BID) | ORAL | Status: DC | PRN

## 2015-01-08 NOTE — Assessment & Plan Note (Signed)
Worsening shortness of breath over the past weeks. Poorly controlled sleep apnea, noncompliant with his CPAP. Waking 15 pounds, worsening leg edema per the patient. We will start Lasix 20 mg twice a day for 2 weeks with potassium twice a day, encouraged him to decrease his fluid intake. If shortness of breath does not improve, recommended he call the office

## 2015-01-08 NOTE — Assessment & Plan Note (Signed)
Recommended he restart his CPAP

## 2015-01-08 NOTE — Assessment & Plan Note (Signed)
He is not taking his metoprolol. He does report some episodes of tachycardia concerning for atrial fibrillation though unable to exclude PVCs with sinus tachycardia. Recommended he restart metoprolol succinate 25 up to 50 mg daily

## 2015-01-08 NOTE — Patient Instructions (Signed)
You are doing well.  Please take metoprolol  1/2 to 1 pill at night for PVCs Take lasix 20 mg with potassium twice a day for 2 weeks for shortness of breath  Please call us if you have new issues that need to be addressed before your next appt.  Your physician wants you to follow-up in: 1 month.

## 2015-01-08 NOTE — Assessment & Plan Note (Signed)
Weight is up 15 pounds from his prior clinic visit. Unable to exclude diastolic CHF. Recommended weight loss, exercise

## 2015-01-08 NOTE — Progress Notes (Signed)
Patient ID: Johnathan [name], male    DOB: [birth year], 59 y.o.   MRN: [id number]  HPI Comments: [name] is a pleasant 59 year old gentleman with obesity, chronic joint pain of the shoulders and knees on long-term low-dose narcotics, hypertension, history of bariatric surgery, atrial fibrillation in August 2013, normal ejection fraction at that time who presents for evaluation of shortness of breath, tachycardia, malaise.  Reports that over the past several weeks he has had increasing shortness of breath. Wife reports he is unable to walk more than 10-15 steps without breathing problem. Weight is up 15 pounds from his prior clinic visit August 2015. He no longer takes Lasix, potassium, metoprolol. Reports having episodes of tachycardia sometimes at rest, sometimes with exertion. Significant shortness of breath when working in his garden such as moving mulch.  Recently feels he did too much as he "almost blacked out" and could not catch his breath. He reports that he was laid off from his job ended 2015, working part-time at [employer] He is non-compliant on his CPAP.  He does not do any regular exercise walking apart from work. He denies any significant chest pain with exertion.  EKG on today's visit shows normal sinus rhythm with frequent PVCs, no significant ST or T-wave changes  Other past medical history Previously reported having tachycardia when he feels hot, improved when he cools down Previously had significant shoulder pain Prior clinic visit with shortness of breath that improved with Lasix  In the hospital 05/31/2012 with left-sided chest pain and palpitations. He was given adenosine in the emergency room that showed atrial fibrillation. He received Cardizem and started on diltiazem infusion and converted to normal sinus rhythm overnight. He was started on anticoagulation, continued on long-acting Cardizem and was discharged after echocardiogram showed normal LV  function.  Echocardiogram showed normal LV function with ejection fraction 50-55%, normal right ventricular systolic pressure, normal left atrium TSH 1.7  Event monitor was performed September 16 2 07/25/2012 that showed normal sinus rhythm with PVCs, no significant atrial fibrillation Blood pressure at home has been labile, typically in the 130-140 systolic range.    No Known Allergies  Outpatient Encounter Prescriptions as of 01/08/2015  Medication Sig  . aspirin 81 MG tablet Take 81 mg by mouth daily.  . B Complex Vitamins (B COMPLEX 50 PO) Take by mouth daily.  Marland Kitchen buPROPion (WELLBUTRIN XL) 300 MG 24 hr tablet Take 1 tablet by mouth  daily  . Cholecalciferol (VITAMIN D3) 2000 UNITS TABS Take by mouth daily.  Marland Kitchen DILT-XR 240 MG 24 hr capsule Take 1 capsule by mouth  daily  . FERROUS GLUCONATE PO Take by mouth daily.  Marland Kitchen gabapentin (NEURONTIN) 100 MG capsule Take 1 capsule (100 mg total) by mouth 3 (three) times daily.  Marland Kitchen GLUCOSAMINE HCL-MSM PO Take by mouth daily.  Marland Kitchen HYDROcodone-acetaminophen (NORCO) 10-325 MG per tablet Take 1 tablet by mouth every 6 (six) hours as needed.  Marland Kitchen losartan-hydrochlorothiazide (HYZAAR) 50-12.5 MG per tablet Take 1 tablet by mouth  daily  . meloxicam (MOBIC) 15 MG tablet Take 1 tablet by mouth  every day  . Multiple Vitamin (MULTIVITAMIN) tablet Take 1 tablet by mouth daily.  . propranolol (INDERAL) 20 MG tablet Take 1 tablet by mouth 3  times daily as needed  . SAW PALMETTO-PHYTOSTEROLS PO Take 320 mg by mouth daily.  . vitamin B-12 (CYANOCOBALAMIN) 500 MCG tablet Take 500 mcg by mouth daily.  . vitamin E 400 UNIT capsule Take 400 Units by  mouth daily.  . furosemide (LASIX) 20 MG tablet Take 1 tablet (20 mg total) by mouth 2 (two) times daily as needed.  . metoprolol succinate (TOPROL-XL) 50 MG 24 hr tablet Take 1 tablet (50 mg total) by mouth daily. Take with or immediately following a meal.  . potassium chloride (K-DUR) 10 MEQ tablet Take 1 tablet (10 mEq  total) by mouth 2 (two) times daily as needed.  . [DISCONTINUED] furosemide (LASIX) 20 MG tablet Take 1 tablet (20 mg total) by mouth 2 (two) times daily as needed. (Patient not taking: Reported on 01/08/2015)  . [DISCONTINUED] metoprolol succinate (TOPROL-XL) 50 MG 24 hr tablet Take 1 tablet (50 mg total) by mouth daily. Take with or immediately following a meal. (Patient not taking: Reported on 01/08/2015)  . [DISCONTINUED] potassium chloride (K-DUR) 10 MEQ tablet Take 1 tablet (10 mEq total) by mouth 2 (two) times daily as needed. (Patient not taking: Reported on 01/08/2015)    Past Medical History  Diagnosis Date  . Hypertension   . B12 deficiency   . Arthritis   . Obesity   . Arrhythmia     A-fib  . History of DVT (deep vein thrombosis)     s/p bariatric surgery    Past Surgical History  Procedure Laterality Date  . Bariatric surgery  2004    Rou en Y   . Arthroscopic repair acl  1993    bilateral knee  . Cholecystectomy  1982  . Meniscus debridement  2011    Left knee    Social History  reports that he has never smoked. He has never used smokeless tobacco. He reports that he drinks about 1.2 oz of alcohol per week. He reports that he does not use illicit drugs.  Family History Family history is unknown by patient.  Review of Systems  Constitutional: Negative.   Respiratory: Negative.   Cardiovascular: Negative.   Gastrointestinal: Negative.   Musculoskeletal: Positive for joint swelling, arthralgias and gait problem.  Skin: Negative.   Neurological: Negative.   Hematological: Negative.   Psychiatric/Behavioral: Negative.   All other systems reviewed and are negative.   BP 132/60 mmHg  Pulse 68  Ht  (1.88 m)  Wt 349 lb 4 oz (158.419 kg)  BMI 44.82 kg/m2  Physical Exam  Constitutional: He is oriented to person, place, and time. He appears well-developed and well-nourished.  obese  HENT:  Head: Normocephalic.  Nose: Nose normal.  Mouth/Throat:  Oropharynx is clear and moist.  Eyes: Conjunctivae are normal. Pupils are equal, round, and reactive to light.  Neck: Normal range of motion. Neck supple. No JVD present.  Cardiovascular: Normal rate, regular rhythm, S1 normal, S2 normal, normal heart sounds and intact distal pulses.  Exam reveals no gallop and no friction rub.   No murmur heard. Trace to 1+ pitting edema on the right lower extremity, trace on the left  Pulmonary/Chest: Effort normal and breath sounds normal. No respiratory distress. He has no wheezes. He has no rales. He exhibits no tenderness.  Abdominal: Soft. Bowel sounds are normal. He exhibits no distension. There is no tenderness.  Musculoskeletal: Normal range of motion. He exhibits no edema or tenderness.  Lymphadenopathy:    He has no cervical adenopathy.  Neurological: He is alert and oriented to person, place, and time. Coordination normal.  Skin: Skin is warm and dry. No rash noted. No erythema.  Psychiatric: He has a normal mood and affect. His behavior is normal. Judgment and thought content normal.  Assessment and Plan   Nursing note and vitals reviewed.

## 2015-01-08 NOTE — Assessment & Plan Note (Signed)
Prior symptoms improved with Lasix. Will try Lasix again If no improvement of his symptoms, may need repeat echocardiogram or stress test

## 2015-01-28 NOTE — H&P (Signed)
PATIENT NAME:  Johnathan [name], Johnathan [name] MR#:  [id number] DATE OF BIRTH:  1956/09/21  DATE OF ADMISSION:  05/30/2012  PRIMARY CARE PHYSICIAN: [facility]  History from the patient, family at bedside, case discussed with ER physician. EKG and imaging studies reviewed.   CHIEF COMPLAINT: Left-sided chest pain, palpitations.   HISTORY OF PRESENTING ILLNESS: The patient is a 59 year old morbidly obese male patient with a history of hypertension and past bariatric surgery, presents to the Emergency Room brought in by EMS with complaints of left-sided chest pain and palpitations which started at 12:30 p.m. today. Initially, he was thought to be in supraventricular tachycardia and had two doses of adenosine which showed atrial fibrillation, after which he received two doses of Cardizem of 10 mg and 20 mg IV which controlled his heart rate into the low hundreds but has bumped up to the 140s at this time. The patient's chest pain has resolved, but the patient is being admitted to the Hospitalist Service for further work-up and control of the heart rate. The patient has received a dose of Lovenox. The patient does not have any lightheadedness, nausea, vomiting, diarrhea, shortness of breath. He does have some on and off cough secondary to sinus problems. He takes a baby aspirin at home. No prior history of atrial fibrillation or cardiac problems. He was recently started on Lisinopril by his primary care physician for borderline elevated blood pressure. Presently, the patient's heart rate is fluctuating between 125 to 145 in atrial fibrillation.   PAST MEDICAL HISTORY:  1. Hypertension.  2. B12 deficiency. 3. Arthritis. 4. Obesity.   PAST SURGICAL HISTORY: Bariatric surgery 10 years back.   SOCIAL HISTORY: The patient lives at home with his wife. He does not smoke. Occasional alcohol, no illicit drugs.   CODE STATUS:  FULL CODE.     FAMILY HISTORY: Diabetes in his family. No history of premature  coronary artery disease or atrial fibrillation.   HOME MEDICATIONS:  1. Aspirin 81 mg oral once a day.  2. B complex 50, 1 tablet oral once a day.  3. Ferrous gluconate 1 tablet oral once a day. 4. Glucosamine chondroitin 1 tablet t.i.d.  5. Hydrochlorothiazide/lisinopril 12.5/20 mg oral once a day.  6. Meloxicam 15 mg oral once a day.  7. Multivitamin 1 tablet oral once a day.  8. Vicodin 5/500 mg, 1 tablet oral t.i.d. as needed.  9. Vitamin B12 500 mcg oral once a day.  10. Vitamin D3, 2000 international units oral once a day.  11. Wellbutrin XL 300 mg, 1 tablet oral once a day.   REVIEW OF SYSTEMS: CONSTITUTIONAL: No fever, fatigue or weakness. EYES: No blurred or double vision, pain, redness. ENT: No tinnitus, ear pain, hearing loss. RESPIRATORY: Has occasional cough. No wheeze or hemoptysis. CARDIOVASCULAR: Left-sided chest pain. No edema, orthopnea. GASTROINTESTINAL: No nausea, vomiting, diarrhea, abdominal pain. GENITOURINARY: No dysuria, hematuria or frequency. ENDOCRINE: No polyuria, nocturia, thyroid problems. HEMATOLOGIC/LYMPHATIC: No anemia, easy bruising or bleeding. MUSCULOSKELETAL: Has arthritis in his knees. No swelling or redness. NEUROLOGIC: No focal numbness, weakness or dysarthria. PSYCHIATRIC: No anxiety or depression.   PHYSICAL EXAMINATION:  VITAL SIGNS: Temperature 97.9, heart rate presently ranging between 125 to 150. The lowest heart rate has been at 76 regular, breathing 18 per minute, blood pressure 116/73, saturating 97% on room air.   GENERAL: Morbidly obese Caucasian male patient lying in bed, comfortable, conversational, cooperative with exam.   PSYCHIATRIC: Alert and oriented x3. Mood and affect appropriate. Judgment intact.  HEENT: Atraumatic, normocephalic. Oral mucosa moist and pink. No oral ulcers or thrush. External ears and nose normal. No pallor. No icterus. Pupils bilaterally equal and reactive to light.   NECK: Supple. No thyromegaly. No palpable  lymph nodes. Trachea midline.   CARDIOVASCULAR: S1, S2, irregular without any murmurs. Peripheral pulses 2+. No lower extremity edema.   LUNGS: Clear to auscultation on both sides with normal work of breathing.   GASTROINTESTINAL: Soft abdomen, nontender. Bowel sounds present. Scars from prior surgeries. No hepatosplenomegaly palpable secondary to obesity.   MUSCULOSKELETAL: No joint swelling, redness, or effusion of the large joints. Normal muscle tone.   NEUROLOGICAL: Motor strength 5/5 in upper and lower extremities. Sensation to fine touch intact all over.   LYMPHATIC: No cervical lymphadenopathy.   LABORATORY, DIAGNOSTIC AND RADIOLOGICAL DATA: Lab studies show glucose of 106, BUN 21, creatinine 0.8, sodium 141, potassium 4.1, chloride 109. AST, ALT, alkaline phosphatase and bilirubin normal. CK 124, CK-MB 1, troponin less than 0.02. TSH 1.72. WBC 9.9, hemoglobin 16.6, platelets 171, INR 1.1. Urinalysis shows no WBC, protein or bacteria. EKG shows atrial fibrillation. The patient's heart rate is 144. Repeat EKG showed atrial fibrillation at 96. No ST elevation found.   ASSESSMENT AND PLAN:  1. New onset atrial fibrillation with rapid ventricular rate: The patient has had on and off palpitations previously and likely has had paroxysmal atrial fibrillation for a few months. Presently, he will need a Cardizem drip for rate control. The patient's [place] score is 1, but we will start him on Lovenox at this time until we have the echo results back to look for any valvular abnormalities. He might be able to be discharged to aspirin eventually. We will transition him to p.o. Cardizem when able. We will also need to rule out acute coronary syndrome. We will get two more sets of cardiac enzymes, repeat EKG in the morning. Consult Cardiology for further help with the case. TSH is in the normal range.  2. Hypertension: Well controlled on hydrochlorothiazide/lisinopril. We will hold those medications as the  patient is being started on Cardizem at this time. We will follow.  3. Chronic pain secondary to arthritis: Continue home medications.  4. Deep vein thrombosis prophylaxis: The patient will be on Lovenox for his atrial fibrillation.  5. GI prophylaxis with Protonix considering the patient is on anticoagulation at this time.   TIME SPENT:   Time spent today on this case, being admitted to the Critical Care Unit on a Cardizem drip with atrial fibrillation with rapid ventricular rate was 50 minutes.  ____________________________ Molinda BailiffSrikar R. Noa Galvao, MD srs:cbb D: 05/30/2012 17:45:09 ET T: 05/30/2012 18:33:42 ET JOB#: [id number]  cc: Wardell HeathSrikar R. Ashleyann Shoun, MD, <Dictator> [facility] Orie FishermanSRIKAR R Arvind Mexicano MD ELECTRONICALLY SIGNED 06/01/2012 14:22

## 2015-01-28 NOTE — Discharge Summary (Signed)
PATIENT NAME:  Johnathan [name], Johnathan [name] MR#:  [id number] DATE OF BIRTH:  [birth year]  DATE OF ADMISSION:  05/30/2012 DATE OF DISCHARGE:  05/31/2012  [facility]    CONSULTANT: Dr. Mariah MillingGollan from Cardiology    CHIEF COMPLAINT: Palpitations.   DISCHARGE DIAGNOSES:  1. Atrial fibrillation with RVR.  2. Hypertension.  3. Obesity.  4. Arthritis.  5. Vitamin B12 deficiency.   DISCHARGE MEDICATIONS:  1. Xarelto 20 mg daily.  2. Cardizem CD 240 mg 1 tab daily.  3. Aspirin 81 mg daily.  4. Wellbutrin XL 300 mg extended-release 1 tab daily.  5. HCTZ/lisinopril 12.5/20 mg daily.  6. Meloxicam 15 mg daily.  7. Vicodin 5/500 mg 1 tab daily 3 times a day as needed for pain.  8. Ferrous gluconate 1 tab once a day.  9. Multivitamin 1 tab daily.  10. B Complex 50 1 tab once a day.  11. Vitamin B12 500 mcg oral tab 1 tab daily.  12. Vitamin D3 2000 international units daily.  13. Glucosamine and chondroitin with MSM oral tab 1 tab daily.  14. Saw Palmetto 320 mg daily.    DIET: Low sodium.   ACTIVITY: As tolerated.   FOLLOW-UP:  1. Please follow-up with your PCP within 1 to 2 weeks.  2. Please follow-up with your cardiologist, Dr. Mariah MillingGollan, within 7 to 10 days.   DISPOSITION: Home.   HISTORY OF PRESENT ILLNESS/HOSPITAL COURSE: For full details of the history and physical, please see the dictation on 05/30/2012 by Dr. [name]. Briefly, this is a 59 year old gentleman with hypertension and obesity who came in with left-sided chest pain and palpitations starting at 12:30 p.m. on 05/30/2012 and was noted to have tachycardia. Initially he thought it was supraventricular tachycardia and the patient had two doses of adenosine which clarified the rhythm as atrial fibrillation and the patient had Cardizem IV x2 without significant improvement in the heart rate. Therefore, the patient was admitted to the CCU with Cardizem drip. Overnight the patient did convert to sinus rhythm and the patient was  seen by Cardiology the following morning. He has maintained sinus rhythm in the 60's and 70's. He was also started on Lovenox therapeutic IV dose. The patient has no history of atrial fibrillation or cardiac problems in the past, however, has been having off and on palpitations in the last several months without any diagnosis of atrial fibrillation, however. The Cardizem drip has been switched to Cardizem p.o. and he has had 240 mg tab x1 and maintained sinus rhythm. At this point an echocardiogram also has been done which, per Cardiology, has no significant valvular problems and he will be discharged with Xarelto 20 mg for anticoagulation as well as the Cardizem. His other medications have been resumed. He was told that if his blood pressure does trend down as he is on a third blood pressure medication he can follow-up with his primary care physician and perhaps hold or down titrate the HCTZ/lisinopril tab. At this point he will be discharged with the above follow-up.   DISPOSITION: Home.   TOTAL TIME SPENT: 35 minutes.   CODE STATUS: The patient is FULL CODE.  ____________________________ Krystal EatonShayiq Castle Lamons, MD sa:drc D: 05/31/2012 10:51:58 ET T: 05/31/2012 12:15:13 ET JOB#: [id number]  cc: Krystal EatonShayiq Aleeha Boline, MD, <Dictator>, [facility] Krystal EatonSHAYIQ Sahirah Rudell MD ELECTRONICALLY SIGNED 06/04/2012 16:16

## 2015-01-28 NOTE — Consult Note (Signed)
General Aspect 59 year old morbidly obese male patient with a history of hypertension and past bariatric surgery, presents to the Emergency Room brought in by EMS with complaints of left-sided chest pain and palpitations which started noon yesterday.   He reports stuttering palpitations with exertion yesterday with radiating pain into the left chest and shoulder. He [place] from [place] to [place].   In the ER, he received two doses of adenosine which showed atrial fibrillation, after which he received two doses of Cardizem of 10 mg and 20 mg IV. Heart rate improved briefly, and he was started on a diltiazem infusion. He converted overnight to NSR.   On the ER,  The patient's chest pain had resolved.  The patient has received a dose of Lovenox. The patient does not have any lightheadedness, nausea, vomiting, diarrhea, shortness of breath. He does have some on and off cough secondary to sinus problems.  No prior history of atrial fibrillation or cardiac problems. He was recently started on Lisinopril by his primary care physician for borderline elevated blood pressure.    Present Illness . PAST MEDICAL HISTORY:  1. Hypertension.  2. B12 deficiency. 3. Arthritis. 4. Obesity.   PAST SURGICAL HISTORY: Bariatric surgery 10 years back.   SOCIAL HISTORY: The patient lives at home with his wife. He does not smoke. Occasional alcohol, no illicit drugs.   Physical Exam:   GEN well developed, well nourished, no acute distress, obese    HEENT red conjunctivae    NECK supple  No masses    RESP normal resp effort  clear BS    CARD Regular rate and rhythm  No murmur    ABD denies tenderness  soft    SKIN normal to palpation    NEURO cranial nerves intact, motor/sensory function intact    PSYCH alert, A+O to time, place, person, good insight   Review of Systems:   Subjective/Chief Complaint Palpitations, chest and arm pain    General: Trouble sleeping    ENT: No Complaints     Eyes: No Complaints    Neck: No Complaints    Respiratory: No Complaints    Cardiovascular: Chest pain or discomfort  Palpitations  resolved    Gastrointestinal: No Complaints    Genitourinary: No Complaints    Vascular: No Complaints    Musculoskeletal: No Complaints    Neurologic: No Complaints    Hematologic: No Complaints    Endocrine: No Complaints    Psychiatric: No Complaints    Review of Systems: All other systems were reviewed and found to be negative    Medications/Allergies Reviewed Medications/Allergies reviewed        Admit Diagnosis:   RAPID AFIB: 31-May-2012, Active, RAPID AFIB  Home Medications: Medication Instructions Status  aspirin 81 mg oral tablet 1 tab(s) orally once a day Active  Wellbutrin XL 300 mg/24 hours oral tablet, extended release 1 tab(s) orally once a day Active  hydrochlorothiazide-lisinopril 12.5 mg-20 mg oral tablet 1 tab(s) orally once a day Active  meloxicam 15 mg oral tablet 1 tab(s) orally once a day Active  Vicodin 5 mg-500 mg oral tablet 1 tab(s) orally 3 times a day, As Needed- for Pain  Active  ferrous gluconate 1 tab(s) orally once a day Active  multivitamin 1 tab(s) orally once a day Active  B Complex 50 1 tab(s) orally once a day Active  Vitamin B12 500 mcg oral tablet 1 tab(s) orally once a day Active  Vitamin D3 2000 intl units oral  capsule 1 cap(s) orally once a day Active  Glucosamine & Chondroitin with MSM oral tablet 1 tab(s) orally once a day Active  saw palmetto 320 mg with phytosterols oral capsule 1 cap(s) orally once a day Active   Lab Results:  Routine Chem:  21-Aug-13 05:24    Glucose, Serum 98   BUN  19   Creatinine (comp) 1.07   Sodium, Serum 143   Potassium, Serum 3.9   Chloride, Serum 107   CO2, Serum 27   Calcium (Total), Serum 8.6   Anion Gap 9   Osmolality (calc) 287   eGFR (African American) >60   eGFR (Non-African American) >60 (eGFR values <13m/min/1.73 m2 may be an indication of  chronic kidney disease (CKD). Calculated eGFR is useful in patients with stable renal function. The eGFR calculation will not be reliable in acutely ill patients when serum creatinine is changing rapidly. It is not useful in  patients on dialysis. The eGFR calculation may not be applicable to patients at the low and high extremes of body sizes, pregnant women, and vegetarians.)   Magnesium, Serum 1.9 (1.8-2.4 THERAPEUTIC RANGE: 4-7 mg/dL TOXIC: > 10 mg/dL  -----------------------)  Cardiac:  21-Aug-13 05:24    Troponin I < 0.02 (0.00-0.05 0.05 ng/mL or less: NEGATIVE  Repeat testing in 3-6 hrs  if clinically indicated. >0.05 ng/mL: POTENTIAL  MYOCARDIAL INJURY. Repeat  testing in 3-6 hrs if  clinically indicated. NOTE: An increase or decrease  of 30% or more on serial  testing suggests a  clinically important change)   CPK-MB, Serum 1.1 (Result(s) reported on 31 May 2012 at 07:45AM.)  Routine Hem:  21-Aug-13 05:24    WBC (CBC) 8.5   RBC (CBC) 4.83   Hemoglobin (CBC) 15.0   Hematocrit (CBC) 44.3   Platelet Count (CBC)  147   MCV 92   MCH 31.0   MCHC 33.8   RDW 13.8   Neutrophil % 57.0   Lymphocyte % 29.6   Monocyte % 11.1   Eosinophil % 1.9   Basophil % 0.4   Neutrophil # 4.8   Lymphocyte # 2.5   Monocyte # 0.9   Eosinophil # 0.2   Basophil # 0.0 (Result(s) reported on 31 May 2012 at 0[facility])   EKG:   Interpretation EKG showing atrial flutter/fib at rate 144 bpm   Radiology Results: XRay:    20-Aug-13 15:55, Chest Portable Single View   Chest Portable Single View    REASON FOR EXAM:    chest pain, tachycardia  COMMENTS:       PROCEDURE: DXR - DXR PORTABLE CHEST SINGLE VIEW  - May 30 2012  3:55PM     RESULT: Comparison: None    Findings:     Single portable AP chest radiograph is provided.  There is no focal   parenchymal opacity, pleural effusion, or pneumothorax. Normal   cardiomediastinal silhouette. The osseous structures are  unremarkable.    IMPRESSION:     No acute disease of the chest.    Dictation Site: 1          Verified By: HJennette Banker M.D., MD    No Known Allergies:   Vital Signs/Nurse's Notes: **Vital Signs.:   21-Aug-13 08:00   Vital Signs Type Routine   Temperature Temperature (F) 98.7   Celsius 37   Temperature Source oral   Pulse Pulse 70   Respirations Respirations 18   Systolic BP Systolic BP 1536  Diastolic BP (mmHg) Diastolic BP (mmHg)  76   Mean BP 94   Pulse Ox % Pulse Ox % 97   Pulse Ox Activity Level  At rest   Oxygen Delivery Room Air/ 21 %     Impression 59 year old morbidly obese male patient with a history of hypertension and past bariatric surgery, presents to the Emergency Room brought in by EMS with complaints of left-sided chest pain and palpitations which started noon yesterday.   1) Arrhythmia Apears to be atrial fib, possible atrial flutter Broke to NSR overnight on cardizem infusion Started on cardizem er 240 mg daily Would start xarelto 20 mg daily Echo today looks good, normal EF, no valve issues  2) HTN improved On cardizem If BP runs low at home, could hold or decrease dose of lisinopril  3)obesity s/p gastric bypass  4) Stress significant work stress.  new job changes, long commute, poor sleep   Electronic Signatures: Ida Rogue (MD)  (Signed 21-Aug-13 09:50)  Authored: General Aspect/Present Illness, History and Physical Exam, Review of System, Health Issues, Home Medications, Labs, EKG , Radiology, Allergies, Vital Signs/Nurse's Notes, Impression/Plan   Last Updated: 21-Aug-13 09:50 by Ida Rogue (MD)

## 2015-01-31 NOTE — Discharge Summary (Signed)
PATIENT NAME:  Johnathan Johnathan [name], Johnathan Johnathan [name] MR#:  [id number] DATE OF BIRTH:  May 07, 1956  DATE OF ADMISSION:  07/30/2013 DATE OF DISCHARGE:  08/02/2013  ADMITTING DIAGNOSIS: Degenerative arthrosis of the left knee.   DISCHARGE DIAGNOSIS: Degenerative arthrosis of the left knee.   HISTORY: The patient is Johnathan [name] 59 year old gentleman who [facility] progression of bilateral knee pain with the left being more symptomatic than the right. He had previously had 3 surgeries to the left knee 1 previous surgery to the right knee. He states that he did have Johnathan [name] history of bilateral ACL tears. The patient had denied any significant swelling of the knees, but did have episodes of giving way as well as "buckling." He was ascending stairs and descending stairs with difficulty. He was taking these 1 at Johnathan [name] time. He has been working on Johnathan [name] weight reduction program and had seen some improvement of his symptoms. Most of the pain however was localized along the medial aspect of the right knee and the lateral aspect of the left knee. He states that the left knee had progressed into Johnathan [name] valgus deformity. At the time of surgery, he was not using any ambulatory aid. The patient states that his pain had progressed to the point that it was significantly interfering with his activities of daily living despite weight loss reduction, anti-inflammatory medications and activity modification. [facility] showed narrowing of the lateral cartilage space with associated bone-on-bone articulation noted as well as Johnathan [name] valgus deformity. Osteophyte and subchondral sclerosis was noted. After discussion of the risks and benefits of surgical intervention, the patient expressed his understanding of the risks and benefits and agreed for plans for surgical intervention.   PROCEDURE: Left total knee arthroplasty using computer-assisted navigation.   ANESTHESIA: Spinal.   SOFT TISSUE RELEASE: Anterior cruciate ligament,  posterior cruciate ligament, deep medial collateral ligament, as well as the patellofemoral ligament and posterolateral corner.   IMPLANTS UTILIZED: DePuy PFC Sigma size 5 posterior stabilized femoral component (cemented), size 6 MBT tibial component (cemented), 38 mm three-pegged oval dome patella (cemented), and Johnathan [name] 15 mm stabilized rotating platform polyethylene insert. Gentamicin cement was utilized.   HOSPITAL COURSE: The patient tolerated the procedure very well. He had no complications. He was then taken to PAC-U where he was stabilized and then transferred to the orthopedic floor. The patient began receiving anticoagulation therapy of Lovenox 40 mg subcu q. 12 hours per anesthesia and pharmacy protocol. He was fitted with TED stockings bilaterally. These were allowed to be removed 1 hour per 8 hour shift. The patient was also fitted with AVI compression foot pumps bilaterally set at 80 mmHg. His calves have been nontender. There has been no evidence of any DVTs. Negative Homans sign. Heels were elevated off the bed using rolled towels.   The patient has denied any chest pain or shortness of breath. Vital signs have been stable. He has been afebrile. Hemodynamically he was stable and no transfusions were given. The patient did receive Autovac transfusions the first 6 hours postop.   Physical therapy was initiated on day 1 for gait training and transfers. He has done very well. Upon being discharged was ambulating greater than 250 feet. He was able go up and down 4 sets of steps. He  was independent with bed to chair transfers. Occupational therapy was also initiated on day 2 for ADL and assistive devices.   The patient's IV, Foley and Hemovac were DC'd on day 2 along with  Johnathan [name] dressing change. The Polar Care was reapplied to the surgical leg maintaining Johnathan [name] temperature of 40 to 50 degrees Fahrenheit.   DISPOSITION: The patient is being discharged to home in improved stable condition.   DISCHARGE  INSTRUCTIONS: He is to continue weight-bearing as tolerated. He needs to continue using Johnathan [name] rolling walker until cleared by physical therapy to go to Johnathan [name] quad cane. He will receive home health PT. Continue TED stockings bilaterally. These are allowed to be removed at night but are to be worn during the day. Continue Polar Care maintaining Johnathan [name] temperature of 40 to 50 degrees Fahrenheit. Recommend elevation of the lower extremities. Recommend also elevating the heels off the bed. Continue incentive spirometer q. 1 hour while awake as well as encourage cough and deep breathing q. 2 hours while awake. He was placed on Johnathan [name] regular diet. Continue Polar Care maintaining Johnathan [name] temperature of 40 to 50 degrees Fahrenheit. Keep the dressing clean and dry. He was given extra dressing to take home. He has Johnathan [name] follow-up appointment in the [facility] on November 4th at 8:45. He is to call the clinic sooner if any temperatures of 101.5 or greater or excessive bleeding.   DRUG ALLERGIES: No known drug allergies.   DISCHARGE MEDICATIONS: The patient is to resume his regular medication that he was on prior to admission. He was given Johnathan [name] prescription for oxycodone 5 to 10 mg q. 4 to 6 hours p.r.n. for pain, tramadol 50 to 100 mg q. 4 to 6 hours p.r.n. for pain and Lovenox 40 mg subcu q. day for 14 days and then discontinue and begin taking one 81 mg enteric-coated aspirin.   PAST MEDICAL HISTORY: 1.  Blood clots in the past.  2.  Atrial fibrillation in 2013.  3.  Sleep apnea for which he has CPAP. 4.  Hypertension.  5.  B12 deficiency. ____________________________ Van Clines, PA jrw:sb D: 08/02/2013 07:39:51 ET T: 08/02/2013 08:14:55 ET JOB#: [id number]  cc: Van Clines, PA, <Dictator> Curlee Bogan PA ELECTRONICALLY SIGNED 08/07/2013 8:23

## 2015-01-31 NOTE — Op Note (Signed)
PATIENT NAME:  Johnathan [name], Johnathan [name] MR#:  [id number] DATE OF BIRTH:  [birth year]  DATE OF PROCEDURE:  07/30/2013  PREOPERATIVE DIAGNOSIS: Degenerative arthrosis of the left knee.   POSTOPERATIVE DIAGNOSIS: Degenerative arthrosis of the left knee.   PROCEDURE PERFORMED: Left total knee arthroplasty using computer-assisted navigation.   SURGEON: Dr. Francesco SorJames Hooten.  ASSISTANT: Van ClinesJon Wolfe, PA (required to maintain retraction throughout the procedure)   ANESTHESIA: Spinal.   ESTIMATED BLOOD LOSS: 500 mL (per CRNA).   FLUIDS REPLACED: 1300 mL of crystalloid.   TOURNIQUET TIME: 111 minutes.   DRAINS: Two medium drains to reinfusion system.   SOFT TISSUE RELEASES: Anterior cruciate ligament, posterior cruciate ligament, deep medial collateral ligament and patellofemoral ligament, and posterolateral corner.   IMPLANTS UTILIZED: DePuy PFC Sigma size 5 posterior stabilized femoral component (cemented), size 6 MBT tibial component (cemented), 38 mm three peg oval dome patella (cemented), and [name] 15 mm stabilized rotating platform polyethylene insert. Gentamicin cement was utilized.   INDICATIONS FOR SURGERY: The patient is [name] 59 year old male who has been seen for complaints of severe progressive left knee pain. X-rays demonstrated severe degenerative changes in tricompartmental fashion with valgus deformity. After discussion of the risks and benefits of surgical intervention, the patient expressed understanding of the risks and benefits and agreed with plans for surgical intervention.   PROCEDURE IN DETAIL: The patient was brought in the operating room, and after adequate spinal anesthesia was achieved, [name] tourniquet was placed on the patient's upper left thigh.  The patient's left knee and leg were cleaned and prepped with alcohol and DuraPrep draped in the usual sterile fashion. [name] "timeout" was performed as per usual protocol. The left lower extremity was exsanguinated using an Esmarch, and the tourniquet  was inflated to 300 mmHg. An anterior longitudinal incision was made, followed by [name] standard mid vastus approach. [name] large effusion was evacuated. The deep fibers of the medial collateral ligament were elevated in [name] subperiosteal fashion off the medial flare of the tibia so as to maintain [name] continuous soft tissue sleeve. The patella was subluxed laterally and the patellofemoral ligament was incised. Inspection of the knee demonstrated severe degenerative changes in tricompartmental fashion with full-thickness loss of articular cartilage to all compartments. Prominent osteophytes were debrided using [name] rongeur. [name] remnant of the anterior cruciate ligament and posterior cruciate ligament were excised. Two 4.0 mm Schanz pins were inserted into the femur and into the tibia for attachment of the array of trackers used for computer-assisted navigation. Hip center was identified using circumduction technique. Distal landmarks were mapped using the computer. The distal femur and proximal tibia were mapped using the computer. Distal femoral cutting guide was positioned using computer-assisted navigation so as to achieve [name] 5 degree distal valgus cut. Cut was performed and verified using the computer. Distal femur was sized and it was felt that [name] size 5 femoral component was appropriate. [name] size 5 cutting guide was positioned and the anterior cut was performed and verified using the computer. This was followed by completion of the posterior and chamfer cuts. Femoral cutting guide for the central box was then positioned and the central box cut was performed. Attention was then directed to the proximal tibia. Medial and lateral menisci were excised. The extramedullary tibial cutting guide was positioned using computer-assisted navigation so as to achieve [name] 0 degree varus valgus alignment and 0 degree posterior slope. Cut was performed and verified using the computer. The proximal tibia was sized and it was felt that [name] size  6 tibial  tray was appropriate. Tibial and femoral trials were inserted, followed by insertion of [name] 10 and subsequently [name] 12.5 mm polyethylene insert. The knee was felt to be tight laterally. Trial components were removed and the knee was brought in full extension with distraction of the joint using the Moreland retractors. Posterolateral corner was carefully released using [name] combination of electrocautery and Metzenbaum scissors. This allowed for appropriate balancing with the knee in extension. Trial components were reinserted with subsequent placement of [name] 50 mm polyethylene trial. This allowed for excellent mediolateral soft tissue balancing, both with the knee in extension and in flexion. Finally, the patella was cut and prepared so as to accommodate [name] 38 mm three peg oval dome patella. Patellar trial was placed and the knee was placed through [name] range of motion with good patellar tracking appreciated. Trial components were removed after debridement of posterior osteophytes. Central post hole for the tibial component was reamed followed by insertion of [name] keel punch. Tibial tray was then removed. The cut surfaces of bone were irrigated with copious amounts of normal saline with antibiotic solution using pulsatile lavage and then suctioned dry. Polymethyl methacrylate cement with gentamicin was prepared in the usual fashion using [name] vacuum mixer. Cement was applied to the cut surface of the proximal tibia as well as along the undersurface of [name] size 6 MBT  tibial component. Tibial component was positioned and impacted into place. Excess cement was removed using freer elevators. Cement was then applied to the cut surface of the femur as well as on the posterior flanges of [name] size 5 posterior stabilized femoral component. Femoral component was positioned and impacted into place. Excess cement was removed using freer elevators. [name] 15 mm polyethylene trial was inserted and the knee was brought in full extension with steady axial  compression applied. Finally, cement was applied to the backside of [name] 38 mm three peg oval dome patella, and the patellar component was positioned with patellar clamp applied. Excess cement was removed using freer elevators.    After adequate curing of cement, the tourniquet was deflated after total tourniquet time of 111 minutes. Hemostasis was achieved using electrocautery. The knee was irrigated with copious amounts of normal saline with antibiotic solution using pulsatile lavage and then suctioned dry. The knee was inspected for any residual cement debris. 20 mL of 1.3% Exparel and 40 mL of normal saline was injected along the posterior capsule, medial and lateral recesses, along the arthrotomy site. [name] 15 mm stabilized rotating platform polyethylene insert was inserted and the knee was placed through [name] range of motion. Excellent mediolateral soft tissue balancing was appreciated and good patellar tracking was noted. Two medium drains were placed in the wound bed and brought out through [name] separate stab incision to be attached to [name] reinfusion system. The medial parapatellar portion of the incision was reapproximated using interrupted sutures of #1 Vicryl. The subcutaneous tissue was then injected with 30 mL of 0.25% Marcaine with epinephrine. The subcutaneous tissue was approximated in layers using first #0 Vicryl followed by 2-0 Vicryl. Skin was closed with skin staples. [name] sterile dressing was applied.   The patient tolerated the procedure well. He was transported to the recovery room in stable condition.   ____________________________ Illene Labrador. Angie Fava., MD jph:sg D: 07/31/2013 06:05:29 ET T: 07/31/2013 07:02:10 ET JOB#: [id number]  cc: Illene Labrador. Angie Fava., MD, <Dictator> JAMES P Angie Fava MD ELECTRONICALLY SIGNED 07/31/2013 22:36

## 2015-02-05 ENCOUNTER — Ambulatory Visit (INDEPENDENT_AMBULATORY_CARE_PROVIDER_SITE_OTHER): Payer: 59 | Admitting: Internal Medicine

## 2015-02-05 ENCOUNTER — Encounter: Payer: Self-pay | Admitting: Internal Medicine

## 2015-02-05 ENCOUNTER — Other Ambulatory Visit: Payer: Self-pay | Admitting: Internal Medicine

## 2015-02-05 ENCOUNTER — Telehealth: Payer: Self-pay | Admitting: Internal Medicine

## 2015-02-05 VITALS — BP 118/88 | HR 66 | Temp 98.2°F | Resp 14 | Ht 74.0 in | Wt 350.5 lb

## 2015-02-05 DIAGNOSIS — I4891 Unspecified atrial fibrillation: Secondary | ICD-10-CM

## 2015-02-05 DIAGNOSIS — I5033 Acute on chronic diastolic (congestive) heart failure: Secondary | ICD-10-CM | POA: Diagnosis not present

## 2015-02-05 DIAGNOSIS — Z96652 Presence of left artificial knee joint: Secondary | ICD-10-CM | POA: Diagnosis not present

## 2015-02-05 DIAGNOSIS — I10 Essential (primary) hypertension: Secondary | ICD-10-CM

## 2015-02-05 DIAGNOSIS — M159 Polyosteoarthritis, unspecified: Secondary | ICD-10-CM

## 2015-02-05 MED ORDER — HYDROCODONE-ACETAMINOPHEN 10-325 MG PO TABS: 1.0000 | ORAL_TABLET | Freq: Four times a day (QID) | ORAL | Status: DC | PRN

## 2015-02-05 NOTE — Progress Notes (Signed)
Pre-visit discussion using our clinic review tool. No additional management support is needed unless otherwise documented below in the visit note.  

## 2015-02-05 NOTE — Progress Notes (Signed)
Patient ID: Johnathan [name], male   DOB: [birth year], 59 y.o.   MRN: [id number]  Patient Active Problem List   Diagnosis Date Noted  . S/P left unicompartmental knee replacement 02/05/2015  . Acute on chronic diastolic CHF (congestive heart failure) 01/08/2015  . Routine general medical examination at a health care facility 11/08/2014  . De Quervain's disease (tenosynovitis) 11/08/2014  . Severe obesity (BMI >= 40) 08/03/2014  . Neck pain 05/13/2014  . Rotator cuff insufficiency of left shoulder 05/13/2014  . Fatigue 05/06/2014  . SOB (shortness of breath) 05/06/2014  . OSA on CPAP 05/06/2014  . Essential hypertension 10/17/2013  . Polyarthritis of multiple sites 07/04/2013  . Neuropathy 07/04/2013  . S/P bariatric surgery 07/04/2013  . Other malaise and fatigue 07/10/2012  . Obesity, Class III, BMI 40-49.9 (morbid obesity) 07/10/2012  . 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 A-fib 06/19/2012  . Palpitations 06/19/2012  . Shortness of breath 06/19/2012    Subjective:  CC:   Chief Complaint  Patient presents with  . Follow-up    medication    HPI:   Johnathan [name] is a 59 y.o. male who presents for  Follow up on chronic conditions including morbid obesity with prior gastric bypass,  Fluid retention complicated by untreated sleep apnea.  Has not been able to take lasix daily bc of the urinary frequency every 20 minutes interferes with his job on a [employer], [employer] line . Taking  It on the weekends,  But not to much effect  The metoprolol he is tolerating   He had sleep apnea diagnosed 2 years ago,  But the test was done in supine position, which he never uses .  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  Now working part time $10/hr  Not using CPAP because he wasn't tolerating it,  waking up gagging  .  Only used it for 3 moths,  Tried the full face mask and  the nasal pillows Cant' sleep in a regular bed because of the pain in his hips and back.  He sleeps  in a recliner.   Bariatric surgery 12 years ago  Decreased weight from 450 to 300 lbs.  Has gained 50 lbs, but has lost 5 lbs in the past week since he resumed work .  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 Using vicodin and gabapentin in the am , 9 am 12 and 4 pm.  1.5,  1/2,  1 and 1    Right knee clicking .  New job has him standing all day long on a cement floor    Past Medical History  Diagnosis Date  . Hypertension   . B12 deficiency   . Arthritis   . Obesity   . Arrhythmia     A-fib  . History of DVT (deep vein thrombosis)     s/p bariatric surgery    Past Surgical History  Procedure Laterality Date  . Bariatric surgery  2004    [place]   . Arthroscopic repair acl  1993    bilateral knee  . Cholecystectomy  1982  . Meniscus debridement  2011    Left knee       The following portions of the patient's history were reviewed and updated as appropriate: Allergies, current medications, and  problem list.    Review of Systems:   Patient denies headache, fevers, malaise, unintentional weight loss, skin rash, eye pain, sinus congestion and sinus pain, sore throat, dysphagia,  hemoptysis , cough, dyspnea, wheezing, chest pain, palpitations, orthopnea, edema, abdominal pain, nausea, melena, diarrhea, constipation, flank pain, dysuria, hematuria, urinary  Frequency, nocturia, numbness, tingling, seizures,  Focal weakness, Loss of consciousness,  Tremor, insomnia, depression, anxiety, and suicidal ideation.     History   Social History  . Marital Status: Married    Spouse Name: N/A  . Number of Children: N/A  . Years of Education: N/A   Occupational History  . Not on file.   Social History Main Topics  . Smoking status: Never Smoker   . Smokeless tobacco: Never Used  . Alcohol Use: 1.2 oz/week    2 Shots of  liquor per week     Comment: Vodka and lemonade occasionally 2-3/week   . Drug Use: No  . Sexual Activity:    Partners: Female     Comment: Wife   Other Topics Concern  . Not on file   Social History Narrative   Currently between jobs   Lives with Wife   1 Son (33) and 1 daughter (54)    2 grandchildren by daughter    1 dog and 1 cat    Enjoys gardening, movies, dining out.     Objective:  Filed Vitals:   02/05/15 1608  BP: 118/88  Pulse: 66  Temp: 98.2 F (36.8 C)  Resp: 14     General appearance: morbidly obese, alert, cooperative and appears stated age Ears: normal TM's and external ear canals both ears Throat: lips, mucosa, and tongue normal; teeth and gums normal Neck: no adenopathy, no carotid bruit, supple, symmetrical, trachea midline and thyroid not enlarged, symmetric, no tenderness/mass/nodules Back: symmetric, no curvature. ROM normal. No CVA tenderness. Lungs: clear to auscultation bilaterally Heart: regular rate and rhythm, S1, S2 normal, no murmur, click, rub or gallop Abdomen: soft, non-tender; bowel sounds normal; no masses,  no organomegaly Pulses: 2+ and symmetric Skin: 2+ pitting edema  Bilaterally to mid tibia. Skin color, texture, turgor normal. No rashes or lesions Lymph nodes: Cervical, supraclavicular, and axillary nodes normal.  Assessment and Plan:  A-fib Rate controlled the the lower extremity a=edema is likely aggravated by use of diltiazem.  Changing to toprol XL     Acute on chronic diastolic CHF (congestive heart failure) Increasing toprol xl to 100 mg bid  .  Addressing LE edema by advising him to take the lasxi before he leaves work since he has 5 or 6 hours to diureses before bedtime.    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  increasing vicodin to 10/325     Updated Medication List Outpatient Encounter Prescriptions as of 02/05/2015  Medication Sig  . aspirin 81 MG tablet Take 81 mg by mouth daily.  . B Complex Vitamins (B COMPLEX 50 PO) Take by mouth daily.  Marland Kitchen buPROPion (WELLBUTRIN XL) 300 MG 24 hr tablet Take 1 tablet by mouth  daily  . Cholecalciferol (VITAMIN D3) 2000 UNITS TABS Take by mouth daily.  Marland Kitchen FERROUS GLUCONATE PO Take by mouth daily.  . furosemide (LASIX) 20 MG tablet Take 1 tablet (20 mg total) by mouth 2 (two) times  daily as needed.  . gabapentin (NEURONTIN) 100 MG capsule Take 1 capsule (100 mg total) by mouth 3 (three) times daily.  Marland Kitchen GLUCOSAMINE HCL-MSM PO Take by mouth daily.  Marland Kitchen HYDROcodone-acetaminophen (NORCO) 10-325 MG per tablet Take 1 tablet by mouth every 6 (six) hours as needed.  Marland Kitchen HYDROcodone-acetaminophen (NORCO) 10-325 MG per tablet Take 1 tablet by mouth every 6 (six) hours as needed.  Marland Kitchen HYDROcodone-acetaminophen (NORCO) 10-325 MG per tablet Take 1 tablet by mouth every 6 (six) hours as needed.  Marland Kitchen losartan-hydrochlorothiazide (HYZAAR) 50-12.5 MG per tablet Take 1 tablet by mouth  daily  . meloxicam (MOBIC) 15 MG tablet Take 1 tablet by mouth  every day  . metoprolol succinate (TOPROL-XL) 50 MG 24 hr tablet Take 1 tablet (50 mg total) by mouth daily. Take with or immediately following a meal.  . Multiple Vitamin (MULTIVITAMIN) tablet Take 1 tablet by mouth daily.  . potassium chloride (K-DUR) 10 MEQ tablet Take 1 tablet (10 mEq total) by mouth 2 (two) times daily as needed.  . SAW PALMETTO-PHYTOSTEROLS PO Take 320 mg by mouth daily.  . vitamin B-12 (CYANOCOBALAMIN) 500 MCG tablet Take 500 mcg by mouth daily.  . vitamin E 400 UNIT capsule Take 400 Units by mouth daily.  . [DISCONTINUED] DILT-XR 240 MG 24 hr capsule Take 1 capsule by mouth  daily  . [DISCONTINUED] HYDROcodone-acetaminophen (NORCO) 10-325 MG per tablet Take 1 tablet by mouth every 6 (six) hours as needed.  .  [DISCONTINUED] HYDROcodone-acetaminophen (NORCO) 10-325 MG per tablet Take 1 tablet by mouth every 6 (six) hours as needed.  . [DISCONTINUED] propranolol (INDERAL) 20 MG tablet Take 1 tablet by mouth 3  times daily as needed     No orders of the defined types were placed in this encounter.    No Follow-up on file.

## 2015-02-05 NOTE — Telephone Encounter (Signed)
Pt needs labs done at [facility]. Will stop by tomorrow to pick up form/msn

## 2015-02-05 NOTE — Patient Instructions (Addendum)
Try taking  the lasix before you leave work,  If it works continue it in the evening   substitute a toprol xl in the morning  instead of diltiazem,  continue toprol xl in the evening  Use the propranolol at work if you have a run of fast heart rate.  We can increase toprol gradually up to 100 mg twice daily if needed to control heart rate   Please review my diet to help you lose weight  7 AM Breakfast:  Choose from the following:  < 5 carbs  Weekdays: Low carbohydrate Protein  Shakes (EAS AdvantEdge "Carb Control" shakes, Atkins,  Muscle Milk or Premier Protein shakes)     Weekends:  a scrambled egg/bacon/cheese burrito made with Mission's "carb  balance" whole wheat tortilla  (about 10 net carbs )  Eggs,  bacon /sausage , Joseph's pita /lavash bread or  (5 carbs)  A slice of fritatta ( egg based baked dish, no  crust:  google it) (< 10 carbs)   Avoid cereal and bananas, oatmeal and cream of wheat and grits. They are loaded with carbohydrates!   10 AM: high protein snack  (< 5 carbs) :  Protein bar by Atkins  Or KIND  Bars  (the snack size, < 200 cal, usually < 6 carbs    A stick of cheese:  Around 1 carb,  100 cal      Other so called "protein bars" tend to be loaded with carbohydrates.  Remember, in food advertising, the word "energy" is synonymous for " carbohydrate."  Lunch:   A Sandwich using the bread choices listed, Can use any  Eggs,  lunchmeat, grilled meat or canned tuna).  Can add avocado, regular mayo/mustard  and cheese.  A Salad using blue cheese, ranch,  Goddess dressing  or vinagrette,  No croutons or "confetti" and no "candied nuts" but regular nuts OK.   2 HARD BOILED EGG WHITES AND A CUP OF one of these greek yogurts:    dannon lt n fit greek yogurt         chobani 100 greek yogurt,    Oikos triple zero greek yogurt       No pretzels or chips.   miniature sweet peppers are a good low carb alternative that provide a "crunch"  The bread is the only source of  carbohydrate in a sandwich and  can be decreased by trying some of these alternatives to traditional loaf bread:   Joseph's pita bread and Lavash (flat) bread :  50 cal and 4 net carbs  available at BJs and [employer].  Taste better when toasted, use as pita chips   Toufayan makes a variety of  flatbreads and  A PITA POCKET.    LOOK FOR  THE ONES THAT ARE 17 NET CARBS OR LESS    Mission makes 2 sizes of  Low carb whole wheat tortillas  (The large one is  210 cal and 6 net carbs)   Avoid "Low fat dressings, as well as Reyne Dumas and [address] dressings    3 PM/ Mid day  Snack:  Consider  1 ounce of  almonds, walnuts, pistachios, pecans, peanuts,  Macadamia nuts or a nut medley that does not contain raisins or cranberries.  No "granola"; the dried cranberries and raisins are loaded with carbohydrates. Mixed nuts as long as there are no raisins,  cranberries or dried fruit.    Try the prosciutto/mozzarella cheese sticks by [employer]  In deli /backery section  High protein   To avoid overindulging in snacks: Try drinking a glass of unsweeted almond/coconut milk  Or a cup of coffee with your Atkins chocolate bar to keep you from having 3!!!   Pork rinds!  Yes Pork Rinds are low carb potato chip substitute!   Toasted Joseph's flatbread with hummous dip (chickpeas)       6 PM  Dinner:     Meat/fowl/fish with a green salad, and either broccoli, cauliflower, green beans, spinach, brussel sprouts, bok choy or  Lima beans. Fried in canola oil /olive oil BUT DO NOT BREAD THE PROTEIN!!      There is a low carb pasta by Dreamfield's that is acceptable and tastes great: only 5 digestible carbs/serving.( All grocery stores but BJs carry it )  Prepared Meals:  Try Kai LevinsMichel Angelo's chicken piccata or chicken or eggplant parm over low carb pasta.(Lowes and BJs)   Clifton CustardAaron Sanchez's "Carnitas" (pulled pork, no sauce,  0 carbs) or his beef pot roast to make a dinner burrito (at [employer])  Barbecue with cole slaw is low  carb BUT NO BUN!  SAME WITH HAMBURGERS     Whole wheat pasta is still full of digestible carbs and  Not as low in glycemic index as Dreamfield's.   Brown rice is still rice,  So skip the rice and noodles if you eat Congohinese or [place]hai (or at least limit to 1/2 cup)  9 PM snack :   Breyer's "low carb" fudgsicle or  ice cream bar (Carb Smart line), or  Weight  Watcher's ice cream bar , or another "no sugar added" ice cream;  a serving of fresh berries/cherries with whipped cream   Cheese or greek yogurt   8 ounces of Blue Diamond unsweetened almond/cococunut milk  Cheese and crackers (using WASA crackers,  They are low carb) or peanut butter on low carb crackers or pita bread   Another Protein Shake       Avoid bananas, pineapple, grapes  and watermelon on a regular basis because they are high in sugar.  THINK OF THEM AS DESSERT and do not have daily   Remember that snack Substitutions should be less than 10 NET carbs per serving and meals should be < 20 net carbs. Remember that carbohydrates from fiber do not affect blood sugar, so you can  subtract fiber grams to get the "net carbs " of any particular food item.

## 2015-02-06 ENCOUNTER — Ambulatory Visit: Payer: 59 | Admitting: Cardiovascular Disease

## 2015-02-06 NOTE — Telephone Encounter (Signed)
CBC, Cmet , lipid, TSH , Vit D  A1c right what DX? Sheet in folder,

## 2015-02-08 NOTE — Assessment & Plan Note (Signed)
Chronic pain aggravated by morbid obesity and job.  increasing vicodin to 10/325

## 2015-02-08 NOTE — Assessment & Plan Note (Addendum)
Increasing toprol xl to 100 mg bid  .  Addressing LE edema by advising him to take the lasxi before he leaves work since he has 5 or 6 hours to diureses before bedtime.

## 2015-02-08 NOTE — Assessment & Plan Note (Signed)
Rate controlled the the lower extremity a=edema is likely aggravated by use of diltiazem.  Changing to toprol XL

## 2015-02-08 NOTE — Assessment & Plan Note (Signed)
Changes today include increasing dose of Toprol xl to 100 mg bid since we are stopping diltiazem

## 2015-03-10 ENCOUNTER — Ambulatory Visit: Payer: 59

## 2015-03-10 ENCOUNTER — Ambulatory Visit
Admission: EM | Admit: 2015-03-10 | Discharge: 2015-03-10 | Disposition: A | Discharge status: Home / Self Care | Payer: 59 | Attending: Family Medicine | Admitting: Family Medicine

## 2015-03-10 ENCOUNTER — Encounter: Payer: Self-pay | Admitting: Emergency Medicine

## 2015-03-10 DIAGNOSIS — M1990 Unspecified osteoarthritis, unspecified site: Secondary | ICD-10-CM | POA: Insufficient documentation

## 2015-03-10 DIAGNOSIS — Z86718 Personal history of other venous thrombosis and embolism: Secondary | ICD-10-CM | POA: Diagnosis not present

## 2015-03-10 DIAGNOSIS — Z79899 Other long term (current) drug therapy: Secondary | ICD-10-CM | POA: Insufficient documentation

## 2015-03-10 DIAGNOSIS — Z6841 Body Mass Index (BMI) 40.0 and over, adult: Secondary | ICD-10-CM | POA: Diagnosis not present

## 2015-03-10 DIAGNOSIS — E538 Deficiency of other specified B group vitamins: Secondary | ICD-10-CM | POA: Diagnosis not present

## 2015-03-10 DIAGNOSIS — I4891 Unspecified atrial fibrillation: Secondary | ICD-10-CM | POA: Diagnosis not present

## 2015-03-10 DIAGNOSIS — W450XXA Nail entering through skin, initial encounter: Secondary | ICD-10-CM | POA: Insufficient documentation

## 2015-03-10 DIAGNOSIS — Z9884 Bariatric surgery status: Secondary | ICD-10-CM | POA: Insufficient documentation

## 2015-03-10 DIAGNOSIS — S91342A Puncture wound with foreign body, left foot, initial encounter: Secondary | ICD-10-CM | POA: Diagnosis not present

## 2015-03-10 DIAGNOSIS — E669 Obesity, unspecified: Secondary | ICD-10-CM | POA: Diagnosis not present

## 2015-03-10 DIAGNOSIS — Z7982 Long term (current) use of aspirin: Secondary | ICD-10-CM | POA: Insufficient documentation

## 2015-03-10 DIAGNOSIS — I10 Essential (primary) hypertension: Secondary | ICD-10-CM | POA: Insufficient documentation

## 2015-03-10 DIAGNOSIS — L03116 Cellulitis of left lower limb: Secondary | ICD-10-CM

## 2015-03-10 MED ORDER — MUPIROCIN CALCIUM 2 % EX CREA: 1.0000 "application " | TOPICAL_CREAM | Freq: Two times a day (BID) | CUTANEOUS | Status: DC

## 2015-03-10 MED ORDER — TETANUS-DIPHTH-ACELL PERTUSSIS 5-2.5-18.5 LF-MCG/0.5 IM SUSP
0.5000 mL | Freq: Once | INTRAMUSCULAR | Status: AC
  Administered 2015-03-10: 0.5 mL via INTRAMUSCULAR

## 2015-03-10 MED ORDER — ACETAMINOPHEN 500 MG PO TABS: 500.0000 mg | ORAL_TABLET | Freq: Four times a day (QID) | ORAL | Status: DC | PRN

## 2015-03-10 MED ORDER — SULFAMETHOXAZOLE-TRIMETHOPRIM 800-160 MG PO TABS: 1.0000 | ORAL_TABLET | Freq: Two times a day (BID) | ORAL | Status: DC

## 2015-03-10 NOTE — Discharge Instructions (Signed)

## 2015-03-10 NOTE — ED Notes (Signed)
Patient states that he stepped on a nail with his left foot on Saturday.  Patient c/o redness and swelling at this site.

## 2015-03-10 NOTE — ED Provider Notes (Signed)
CSN: [id number]     Arrival date & time 03/10/15  1040 History   First MD Initiated Contact with Patient 03/10/15 1228     Chief Complaint  Patient presents with  . Puncture Wound   (Consider location/radiation/quality/duration/timing/severity/associated sxs/prior Treatment) HPI Comments: Caucasian male stepped on rusty nail 08 Mar 2015 wearing tennis shoes felt it puncture skin and was able to not bear full weight inserted skin approximately 1 inch per patient.  Thinks he removed entire nail no fragments left behind; went out with spouse that night after cleaning it up with soap, neosporin, soak epsom salts.  Started to become sore yesterday and at 1700 hot red top of foot now pain in lower leg and some redness also; typically bilateral lower legs swell daily but left worse than right today. Unsure of last tetanus injection ?10 years ago or more.  Patient also reported two weeks ago he dropped steel plate left foot and bruising had just resolved.   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    Past Medical History  Diagnosis Date  . Hypertension   . B12 deficiency   . Arthritis   . Obesity   . Arrhythmia     A-fib  . History of DVT (deep vein thrombosis)     s/p bariatric surgery   Past Surgical History  Procedure Laterality Date  . Bariatric surgery  2004    [place]   . Arthroscopic repair acl  1993    bilateral knee  . Cholecystectomy  1982  . Meniscus debridement  2011    Left knee  . Replacement total knee Left   . Bariatric surgery     Family History  Problem Relation Age of Onset  . Family history unknown: Yes   History  Substance Use Topics  . Smoking status: Never Smoker   . Smokeless tobacco: Never Used  . Alcohol Use: 1.2 oz/week    2 Shots of liquor per week     Comment: Vodka and lemonade occasionally 2-3/week     Review of Systems    Constitutional: Negative for fever, chills, diaphoresis, activity change, appetite change and fatigue.  HENT: Negative for congestion, facial swelling and nosebleeds.   Eyes: Negative for photophobia, pain, discharge, redness, itching and visual disturbance.  Respiratory: Negative for cough, shortness of breath, wheezing and stridor.   Cardiovascular: Positive for leg swelling. Negative for chest pain and palpitations.  Gastrointestinal: Negative for nausea, vomiting, diarrhea, constipation and blood in stool.  Endocrine: Negative for cold intolerance and heat intolerance.  Genitourinary: Negative for hematuria.  Musculoskeletal: Positive for myalgias. Negative for neck pain and neck stiffness.  Skin: Positive for color change, rash and wound. Negative for pallor.  Allergic/Immunologic: Negative for environmental allergies and food allergies.  Neurological: Negative for tremors, seizures, syncope, speech difficulty, weakness and numbness.  Hematological: Negative for adenopathy. Does not bruise/bleed easily.  Psychiatric/Behavioral: Negative for behavioral problems, confusion, sleep disturbance and agitation.    Allergies  Review of patient's allergies indicates no known allergies.  Home Medications   Prior to Admission medications   Medication Sig Start Date End Date Taking? Authorizing Provider  diltiazem (DILACOR XR) 120 MG 24 hr capsule Take 120 mg by mouth daily.   Yes Historical Provider, MD  acetaminophen (TYLENOL) 500 MG tablet Take 1 tablet (500 mg total) by mouth every 6 (six) hours  as needed. 03/10/15   Barbaraann Barthelina A Roylene Heaton, NP  aspirin 81 MG tablet Take 81 mg by mouth daily.    Historical Provider, MD  B Complex Vitamins (B COMPLEX 50 PO) Take by mouth daily.    Historical Provider, MD  buPROPion (WELLBUTRIN XL) 300 MG 24 hr tablet Take 1 tablet by mouth  daily 02/05/15   Sherlene Shamseresa L [name], MD  Cholecalciferol (VITAMIN D3) 2000 UNITS TABS Take by mouth daily.    Historical Provider,  MD  FERROUS GLUCONATE PO Take by mouth daily.    Historical Provider, MD  furosemide (LASIX) 20 MG tablet Take 1 tablet (20 mg total) by mouth 2 (two) times daily as needed. 01/08/15   Antonieta Ibaimothy J Gollan, MD  gabapentin (NEURONTIN) 100 MG capsule Take 1 capsule (100 mg total) by mouth 3 (three) times daily. 07/31/14   Judi SaaZachary M Smith, DO  GLUCOSAMINE HCL-MSM PO Take by mouth daily.    Historical Provider, MD  HYDROcodone-acetaminophen (NORCO) 10-325 MG per tablet Take 1 tablet by mouth every 6 (six) hours as needed. 02/05/15   Sherlene Shamseresa L [name], MD  HYDROcodone-acetaminophen (NORCO) 10-325 MG per tablet Take 1 tablet by mouth every 6 (six) hours as needed. 02/05/15   Sherlene Shamseresa L [name], MD  HYDROcodone-acetaminophen (NORCO) 10-325 MG per tablet Take 1 tablet by mouth every 6 (six) hours as needed. 02/05/15   Sherlene Shamseresa L [name], MD  losartan-hydrochlorothiazide [facility](HYZAAR) 50-12.5 MG per tablet Take 1 tablet by mouth  daily 12/13/14   Sherlene Shamseresa L [name], MD  meloxicam [facility](MOBIC) 15 MG tablet Take 1 tablet by mouth  every day 11/06/14   Sherlene Shamseresa L [name], MD  metoprolol succinate (TOPROL-XL) 50 MG 24 hr tablet Take 1 tablet (50 mg total) by mouth daily. Take with or immediately following a meal. 01/08/15   Antonieta Ibaimothy J Gollan, MD  Multiple Vitamin (MULTIVITAMIN) tablet Take 1 tablet by mouth daily.    Historical Provider, MD  mupirocin cream (BACTROBAN) 2 % Apply 1 application topically 2 (two) times daily. 03/10/15   Barbaraann Barthelina A Talley Casco, NP  potassium chloride (K-DUR) 10 MEQ tablet Take 1 tablet (10 mEq total) by mouth 2 (two) times daily as needed. 01/08/15   Antonieta Ibaimothy J Gollan, MD  SAW PALMETTO-PHYTOSTEROLS PO Take 320 mg by mouth daily.    Historical Provider, MD  sulfamethoxazole-trimethoprim (BACTRIM DS,SEPTRA DS) 800-160 MG per tablet Take 1 tablet by mouth 2 (two) times daily. 03/10/15   Barbaraann Barthelina A Arlena Marsan, NP  vitamin B-12 (CYANOCOBALAMIN) 500 MCG tablet Take 500 mcg by mouth daily.    Historical Provider, MD  vitamin E 400 UNIT capsule  Take 400 Units by mouth daily.    Historical Provider, MD   BP 141/84 mmHg  Pulse 120  Temp(Src) 97.5 F (36.4 C) (Tympanic)  Resp 16  Ht 6\' 3"  (1.905 m)  Wt 350 lb (158.759 kg)  BMI 43.75 kg/m2  SpO2 98% Physical Exam  Constitutional: He is oriented to person, place, and time. Vital signs are normal. He appears well-developed and well-nourished. No distress.  HENT:  Head: Normocephalic and atraumatic.  Right Ear: External ear normal.  Left Ear: External ear normal.  Nose: Nose normal.  Mouth/Throat: Oropharynx is clear and moist. No oropharyngeal exudate.  Eyes: Conjunctivae, EOM and lids are normal. Pupils are equal, round, and reactive to light. Right eye exhibits no discharge. Left eye exhibits no discharge. No scleral icterus.  Neck: Trachea normal and normal range of motion. Neck supple. No JVD present. No tracheal deviation present.  Cardiovascular: Normal rate, regular rhythm, normal heart sounds, intact distal pulses and normal pulses.  Exam reveals no gallop and no friction rub.   No murmur heard. Pulses:      Dorsalis pedis pulses are 2+ on the right side, and 2+ on the left side.       Posterior tibial pulses are 2+ on the right side, and 2+ on the left side.  Bilateral lower extremities pitting edema 1+/4 bilaterally feet/ankles/distal lower extremities  Pulmonary/Chest: Effort normal and breath sounds normal. No respiratory distress. He has no wheezes.  Abdominal: Soft. He exhibits no distension.  Musculoskeletal: He exhibits edema and tenderness.       Left foot: There is decreased range of motion, tenderness, swelling and laceration. There is no bony tenderness, normal capillary refill, no crepitus and no deformity.       Feet:  Lymphadenopathy:    He has no cervical adenopathy.  Neurological: He is alert and oriented to person, place, and time.  Skin: Skin is warm, dry and intact. Rash noted. He is not diaphoretic. There is erythema. No pallor.  Psychiatric: He  has a normal mood and affect. His speech is normal and behavior is normal. Judgment and thought content normal. Cognition and memory are normal.  Nursing note and vitals reviewed.   ED Course  Procedures (including critical care time) Labs Review Labs Reviewed - No data to display  Imaging Review Dg Foot Complete Left  03/10/2015   CLINICAL DATA:  Puncture wound, stepped on nail 2 days ago  EXAM: LEFT FOOT - COMPLETE 3+ VIEW  COMPARISON:  None.  FINDINGS: Three views of the left foot submitted. No acute fracture or subluxation. No radiopaque foreign body. There is soft tissue swelling distal metatarsal region. Plantar spur of calcaneus. Mild dorsal spurring tarsal region.  IMPRESSION: No acute fracture or subluxation. No radiopaque foreign body. Degenerative changes as described above. Soft tissue swelling distal metatarsal region.   Electronically Signed   By: Natasha Mead M.D.   On: 03/10/2015 13:08   Patient elevating left foot on gurney in exam room prior to xray.  Refused offer pain medication or ice pack.    MDM   1. Cellulitis of left lower extremity    Discussed negative fracture/foreign body for foot xray.  Copy radiology report given to patient.  Work excuse x 2 days.  Rest, elevate, ice prn.  Tylenol  po QID prn.  Bactrim DS po BID.  Tetanus vaccination today.  Xray today.  Discussed if erythema extends past knee or pain to knee go to ER tonight.  Exitcare handout on skin infection given to patient.  RTC if worsening erythema, pain, purulent discharge, fever.  Patient  And spouse verbalized understanding, agreed with plan of care and had no further questions at this time.      Barbaraann Barthel, NP 03/10/15 1458

## 2015-03-10 NOTE — ED Notes (Signed)
States stood on an old nail left foot, bottom, base of 3rd and 4th toe. Last night noted redness dorsal left foot base of 2nd-3rd and 4th toe. Increased pain

## 2015-03-20 ENCOUNTER — Other Ambulatory Visit: Payer: Self-pay | Admitting: Internal Medicine

## 2015-04-07 ENCOUNTER — Telehealth: Payer: Self-pay | Admitting: Internal Medicine

## 2015-04-07 NOTE — Telephone Encounter (Signed)
Patient called for an appointment for Bullseye rash at tick bite received 4 weeks ago patient concerned about Lyme disease, tried to call patient with an appointment no answer and voicemail is full. Have scheduled patient for 8.15 am.FYI will continue to try and reach patient with appointment.

## 2015-04-07 NOTE — Telephone Encounter (Signed)
Patient notified of appointment.  

## 2015-04-08 ENCOUNTER — Encounter: Payer: Self-pay | Admitting: Internal Medicine

## 2015-04-08 ENCOUNTER — Ambulatory Visit (INDEPENDENT_AMBULATORY_CARE_PROVIDER_SITE_OTHER): Payer: 59 | Admitting: Internal Medicine

## 2015-04-08 ENCOUNTER — Encounter: Payer: Self-pay | Admitting: Physician Assistant

## 2015-04-08 ENCOUNTER — Ambulatory Visit (INDEPENDENT_AMBULATORY_CARE_PROVIDER_SITE_OTHER): Payer: 59 | Admitting: Physician Assistant

## 2015-04-08 VITALS — BP 140/80 | HR 76 | Temp 98.3°F | Resp 14 | Ht 75.0 in | Wt 354.5 lb

## 2015-04-08 VITALS — BP 110/72 | HR 131 | Ht 75.0 in | Wt 354.2 lb

## 2015-04-08 DIAGNOSIS — R0602 Shortness of breath: Secondary | ICD-10-CM | POA: Diagnosis not present

## 2015-04-08 DIAGNOSIS — I4891 Unspecified atrial fibrillation: Secondary | ICD-10-CM

## 2015-04-08 DIAGNOSIS — E538 Deficiency of other specified B group vitamins: Secondary | ICD-10-CM | POA: Insufficient documentation

## 2015-04-08 DIAGNOSIS — I480 Paroxysmal atrial fibrillation: Secondary | ICD-10-CM | POA: Diagnosis not present

## 2015-04-08 DIAGNOSIS — S30861A Insect bite (nonvenomous) of abdominal wall, initial encounter: Secondary | ICD-10-CM | POA: Diagnosis not present

## 2015-04-08 DIAGNOSIS — A6920 Lyme disease, unspecified: Secondary | ICD-10-CM | POA: Diagnosis not present

## 2015-04-08 DIAGNOSIS — R5383 Other fatigue: Secondary | ICD-10-CM

## 2015-04-08 DIAGNOSIS — R9431 Abnormal electrocardiogram [ECG] [EKG]: Secondary | ICD-10-CM | POA: Diagnosis not present

## 2015-04-08 DIAGNOSIS — R079 Chest pain, unspecified: Secondary | ICD-10-CM | POA: Diagnosis not present

## 2015-04-08 DIAGNOSIS — I10 Essential (primary) hypertension: Secondary | ICD-10-CM

## 2015-04-08 DIAGNOSIS — G4733 Obstructive sleep apnea (adult) (pediatric): Secondary | ICD-10-CM | POA: Insufficient documentation

## 2015-04-08 DIAGNOSIS — E669 Obesity, unspecified: Secondary | ICD-10-CM | POA: Insufficient documentation

## 2015-04-08 DIAGNOSIS — Z79899 Other long term (current) drug therapy: Secondary | ICD-10-CM

## 2015-04-08 DIAGNOSIS — R0601 Orthopnea: Secondary | ICD-10-CM

## 2015-04-08 DIAGNOSIS — M1990 Unspecified osteoarthritis, unspecified site: Secondary | ICD-10-CM | POA: Insufficient documentation

## 2015-04-08 DIAGNOSIS — Z86718 Personal history of other venous thrombosis and embolism: Secondary | ICD-10-CM | POA: Insufficient documentation

## 2015-04-08 DIAGNOSIS — W57XXXA Bitten or stung by nonvenomous insect and other nonvenomous arthropods, initial encounter: Secondary | ICD-10-CM

## 2015-04-08 DIAGNOSIS — R5381 Other malaise: Secondary | ICD-10-CM

## 2015-04-08 HISTORY — DX: Lyme disease, unspecified: A69.20

## 2015-04-08 LAB — TSH: TSH: 1.73 u[IU]/mL (ref 0.35–4.50)

## 2015-04-08 LAB — CBC WITH DIFFERENTIAL/PLATELET
Basophils Absolute: 0 10*3/uL (ref 0.0–0.1)
Basophils Relative: 0.4 % (ref 0.0–3.0)
Eosinophils Absolute: 0.1 10*3/uL (ref 0.0–0.7)
Eosinophils Relative: 1.9 % (ref 0.0–5.0)
HCT: 44.2 % (ref 39.0–52.0)
Hemoglobin: 14.9 g/dL (ref 13.0–17.0)
LYMPHS PCT: 28.2 % (ref 12.0–46.0)
Lymphs Abs: 2 10*3/uL (ref 0.7–4.0)
MCHC: 33.6 g/dL (ref 30.0–36.0)
MCV: 91.1 fl (ref 78.0–100.0)
Monocytes Absolute: 1 10*3/uL (ref 0.1–1.0)
Monocytes Relative: 13.1 % — ABNORMAL HIGH (ref 3.0–12.0)
NEUTROS PCT: 56.4 % (ref 43.0–77.0)
Neutro Abs: 4.1 10*3/uL (ref 1.4–7.7)
PLATELETS: 130 10*3/uL — AB (ref 150.0–400.0)
RBC: 4.85 Mil/uL (ref 4.22–5.81)
RDW: 14.7 % (ref 11.5–15.5)
WBC: 7.3 10*3/uL (ref 4.0–10.5)

## 2015-04-08 LAB — BRAIN NATRIURETIC PEPTIDE: Pro B Natriuretic peptide (BNP): 432 pg/mL — ABNORMAL HIGH (ref 0.0–100.0)

## 2015-04-08 LAB — TROPONIN I: TNIDX: 0.01 ug/L (ref 0.00–0.06)

## 2015-04-08 MED ORDER — APIXABAN 5 MG PO TABS: 5.0000 mg | ORAL_TABLET | Freq: Two times a day (BID) | ORAL | Status: DC

## 2015-04-08 MED ORDER — METOPROLOL SUCCINATE ER 50 MG PO TB24: 50.0000 mg | ORAL_TABLET | Freq: Two times a day (BID) | ORAL | Status: DC

## 2015-04-08 MED ORDER — NITROGLYCERIN 0.4 MG SL SUBL: 0.4000 mg | SUBLINGUAL_TABLET | SUBLINGUAL | Status: DC | PRN

## 2015-04-08 MED ORDER — POTASSIUM CHLORIDE ER 20 MEQ PO TBCR: 20.0000 meq | EXTENDED_RELEASE_TABLET | Freq: Every day | ORAL | Status: DC

## 2015-04-08 MED ORDER — DIGOXIN 250 MCG PO TABS: 0.2500 mg | ORAL_TABLET | Freq: Every day | ORAL | Status: DC

## 2015-04-08 MED ORDER — DOXYCYCLINE HYCLATE 100 MG PO CAPS: 100.0000 mg | ORAL_CAPSULE | Freq: Two times a day (BID) | ORAL | Status: DC

## 2015-04-08 MED ORDER — FUROSEMIDE 40 MG PO TABS: 40.0000 mg | ORAL_TABLET | Freq: Every day | ORAL | Status: DC

## 2015-04-08 NOTE — Assessment & Plan Note (Signed)
Starting doxycycline ,  Lymes  Ab titers ordered

## 2015-04-08 NOTE — Patient Instructions (Addendum)
Doxycycline 100 mg twice daily x 10 days TAKE WITH FOOD TO PREVENT NAUSEA  NITROGLYCERIN SUBLINGUAL FOR NEXT OCCURRENCE OF SHORTNESS OF BREATH   Please take a daily probiotic ( Align, Floraque or Culturelle) or the generic equivalent for a minimum of 3 weeks   to prevent a serious antibiotic associated diarrhea  Called clostridium dificile colitis    We are calling Dr Windell HummingbirdGollan's office to get you in ASAP

## 2015-04-08 NOTE — Addendum Note (Signed)
Addended by: Sherlene ShamsULLO, TERESA L on: 04/08/2015 09:19 PM   Modules accepted: Level of Service

## 2015-04-08 NOTE — Patient Instructions (Addendum)
Medication Instructions:  - restart Metoprolol 50 mg twice daily - start Digoxin 0.25 mg once daily - start Eliquis 5 mg twice daily - increase Lasix to 40 mg in the afternoon - increase Potassium to 20 meq w/ Lasix -stop aspirin  Labwork: CBC BMET  Testing/Procedures: Your physician has requested that you have an echocardiogram. Echocardiography is a painless test that uses sound waves to create images of your heart. It provides your doctor with information about the size and shape of your heart and how well your heart's chambers and valves are working. This procedure takes approximately one hour. There are no restrictions for this procedure.  Your physician has requested that you have a lexiscan myoview. Please follow instruction sheet, as given.   Follow-Up: 1 month (or sooner if your HR remains elevated)  Any Other Special Instructions Will Be Listed Below (If Applicable). [facility] MYOVIEW  Your caregiver has ordered a Stress Test with nuclear imaging. The purpose of this test is to evaluate the blood supply to your heart muscle. This procedure is referred to as a "Non-Invasive Stress Test." This is because other than having an IV started in your vein, nothing is inserted or "invades" your body. Cardiac stress tests are done to find areas of poor blood flow to the heart by determining the extent of coronary artery disease (CAD). Some patients exercise on a treadmill, which naturally increases the blood flow to your heart, while others who are  unable to walk on a treadmill due to physical limitations have a pharmacologic/chemical stress agent called Lexiscan . This medicine will mimic walking on a treadmill by temporarily increasing your coronary blood flow.   Please note: these test may take anywhere between 2-4 hours to complete  [facility]  THE VOLUNTEERS AT THE FIRST DESK WILL DIRECT YOU WHERE TO GO  Date of Procedure:____Friday, July  8____  Arrival Time for Procedure:_____7:15 am_____  Instructions regarding medication:   __X__:  Hold medications as follows:  -Metoprolol & diltiazem the night before and morning of procedure -Lasix the morning of procedure  PLEASE NOTIFY THE OFFICE AT LEAST 24 HOURS IN ADVANCE IF YOU ARE UNABLE TO KEEP YOUR APPOINTMENT.  [phone] AND  PLEASE NOTIFY [facility] AT [facility] AT LEAST 24 HOURS IN ADVANCE IF YOU ARE UNABLE TO KEEP YOUR APPOINTMENT. [phone]  How to prepare for your Myoview test:   Do not eat or drink after midnight  No caffeine for 24 hours prior to test  No smoking 24 hours prior to test.  Your medication may be taken with water.  If your doctor stopped a medication because of this test, do not take that medication.  Ladies, please do not wear dresses.  Skirts or pants are appropriate. Please wear a short sleeve shirt.  No perfume, cologne or lotion.   Adenosine Stress Electrocardiography An adenosine stress electrocardiography is a test used to detect heart disease (coronary artery disease). Adenosine is a medicine that makes the heart arteries react as if you are exercising. Adenosine is given with a radioactive tracer. The "tracer" is a safe radioactive substance that travels in the bloodstream to the heart arteries. Special imaging cameras detect the tracer and help find blocked arteries in the heart. This test may be done with or without treadmill exercise.  LET [facility] PROVIDER KNOW ABOUT:  Allergies, including latex allergies.  All prescription medicines you taking as well as all non-prescription and over-the-counter medicines, including herbs and vitamins.  Use of steroids (by mouth or creams).  Previous problems with anesthetics or novocaine.  History of blood clots or bleeding problems.  Previous surgery.  Other health problems such as kidney or lung conditions.  Possibility of pregnancy, if this applies. RISKS AND  COMPLICATIONS You may develop chest discomfort, shortness of breath, sweating, or light-headedness during the test. On rare occasions, you could experience a heart attack or your heart may go into a very fast or irregular rhythm. This could cause you to collapse. To ensure your safety, your health care provider will supervise the test. Your blood pressure and electrocardiogram are constantly watched. The test team watches for and is able to treat any problems. BEFORE THE PROCEDURE  Do not eat or drink caffeine for 12 to 24 hours before the test. This includes all caffeinated beverages and food, such as pop, coffee (roasted, instant, decaffeinated roasted, decaffeinated instant), hot chocolate, tea, and all chocolate.  Do not smoke on the day of your test. Smoking on the day of your test may change your test results.  Do not eat anything 3 hours before the test or as recommended by your health care provider. Eating may cause an unclear image and may also cause nausea. If you are diabetic, talk to your health care provider regarding your insulin coverage.  Bring a list of all the medicines you are taking. Take your medicine as usual before the test except as told by the testing center.  Wear comfortable clothing, such as a short-sleeve shirt and sweatpants. Do not wear an underwire bra or jewelry. A hospital gown can be provided.  Shower before your appointment to reduce the spread of bacteria.  You may want to bring a book to read because there are some waiting periods during the test.  Your health care provider will go over the adenosine stress test with you, such as procedure protocol, what to expect, how long it will take, and results. PROCEDURE   An IV will be started in a vein in your hand or arm.  Electrode patches will be placed on your chest. The electrodes are connected to a monitor so your heart rhythm and heart rate can be watched. Your blood pressure will also be monitored during  the test.  Two sets of images are usually taken of your heart. The images compare your heart at rest and when it is "stressed." This first image is a "resting" picture of your heart. The "resting" image is usually done before adenosine is given.  Adenosine is given in the IV over a period of 4 to 6 minutes.  After the adenosine is given, you will be monitored for a few minutes afterward to ensure your heart rate, heart rhythm, and blood pressure are normal. AFTER THE PROCEDURE  When your test is completed, you may be asked to schedule an office visit with your health care provider to discuss the test results, or your health care provider may choose to call you with the results.  Document Released: 12/05/2006 Document Revised: 02/11/2014 Document Reviewed: 01/12/2012 [facility] Patient Information 2015 [place], [place]. This information is not intended to replace advice given to you by your health care provider. Make sure you discuss any questions you have with your health care provider. Echocardiogram An echocardiogram, or echocardiography, uses sound waves (ultrasound) to produce an image of your heart. The echocardiogram is simple, painless, obtained within a short period of time, and offers valuable information to your health care provider. The images from an echocardiogram  can provide information such as:  Evidence of coronary artery disease (CAD).  Heart size.  Heart muscle function.  Heart valve function.  Aneurysm detection.  Evidence of a past heart attack.  Fluid buildup around the heart.  Heart muscle thickening.  Assess heart valve function. LET [facility] CARE PROVIDER KNOW ABOUT:  Any allergies you have.  All medicines you are taking, including vitamins, herbs, eye drops, creams, and over-the-counter medicines.  Previous problems you or members of your family have had with the use of anesthetics.  Any blood disorders you have.  Previous surgeries you have  had.  Medical conditions you have.  Possibility of pregnancy, if this applies. BEFORE THE PROCEDURE  No special preparation is needed. Eat and drink normally.  PROCEDURE   In order to produce an image of your heart, gel will be applied to your chest and a wand-like tool (transducer) will be moved over your chest. The gel will help transmit the sound waves from the transducer. The sound waves will harmlessly bounce off your heart to allow the heart images to be captured in real-time motion. These images will then be recorded.  You may need an IV to receive a medicine that improves the quality of the pictures. AFTER THE PROCEDURE You may return to your normal schedule including diet, activities, and medicines, unless your health care provider tells you otherwise. Document Released: 09/24/2000 Document Revised: 02/11/2014 Document Reviewed: 06/04/2013 [facility] Patient Information 2015 [place], [place]. This information is not intended to replace advice given to you by your health care provider. Make sure you discuss any questions you have with your health care provider. 0

## 2015-04-08 NOTE — Progress Notes (Signed)
Pre-visit discussion using our clinic review tool. No additional management support is needed unless otherwise documented below in the visit note.  

## 2015-04-08 NOTE — Assessment & Plan Note (Signed)
May be secondary to new onset cardiomyopathy or due to Afib  induced diastolic dysfunction causing heart failure

## 2015-04-08 NOTE — Progress Notes (Addendum)
Subjective:  Patient ID: Johnathan [name], male    DOB: 03-21-1956  Age: 59 y.o. MRN: [id number]  CC: The primary encounter diagnosis was Uncontrolled atrial fibrillation. Diagnoses of SOB (shortness of breath), Tick bite of abdomen, initial encounter, Erythema migrans (Lyme disease), PAF (paroxysmal atrial fibrillation), and Orthopnea were also pertinent to this visit.  HPI Johnathan [name] presents  With signs and symptoms concerning  for Lymes disease.  He noticed a red  circular rash with central pallor on his abdomen 4  days ago.  The rash developed at the site of  a tick which was pulled off about 14 days  ago.   has been having episodes of severe dyspnea accompanied by burniing pain across his upper abdomen..  the dyspnea causes him to stop walking.  Not occuring daily,  But  every 3 or 4 days , initially with activity , but more recently occurred while relaxing in hisrecliner.   Described as orthopnea.   Measured HR of 138 and BP 144/90,    History of PAF,  Last EF 50 to 55%  By ECHO Sept 2013.  Wt gain of 4 lbs noted.  Not taking lasix for LE edema due to work schedule   Erythema migrans photographed by patient earlier in the week,     Outpatient Prescriptions Prior to Visit  Medication Sig Dispense Refill  . B Complex Vitamins (B COMPLEX 50 PO) Take by mouth daily.    Marland Kitchen buPROPion (WELLBUTRIN XL) 300 MG 24 hr tablet Take 1 tablet by mouth  daily 90 tablet 0  . Cholecalciferol (VITAMIN D3) 2000 UNITS TABS Take by mouth daily.    Marland Kitchen diltiazem (DILACOR XR) 240 MG 24 hr capsule Take 1 capsule by mouth  daily 90 capsule 1  . FERROUS GLUCONATE PO Take by mouth daily.    Marland Kitchen gabapentin (NEURONTIN) 100 MG capsule Take 1 capsule (100 mg total) by mouth 3 (three) times daily. 270 capsule 3  . GLUCOSAMINE HCL-MSM PO Take by mouth daily.    Marland Kitchen HYDROcodone-acetaminophen (NORCO) 10-325 MG per tablet Take 1 tablet by mouth every 6 (six) hours as needed. 120 tablet 0  . losartan-hydrochlorothiazide  (HYZAAR) 50-12.5 MG per tablet Take 1 tablet by mouth  daily 90 tablet 1  . meloxicam (MOBIC) 15 MG tablet Take 1 tablet by mouth  every day 90 tablet 2  . Multiple Vitamin (MULTIVITAMIN) tablet Take 1 tablet by mouth daily.    . vitamin B-12 (CYANOCOBALAMIN) 500 MCG tablet Take 500 mcg by mouth daily.    . vitamin E 400 UNIT capsule Take 400 Units by mouth daily.    Marland Kitchen acetaminophen (TYLENOL) 500 MG tablet Take 1 tablet (500 mg total) by mouth every 6 (six) hours as needed. (Patient not taking: Reported on 04/08/2015) 30 tablet 0  . aspirin 81 MG tablet Take 81 mg by mouth daily.    Marland Kitchen diltiazem (DILACOR XR) 120 MG 24 hr capsule Take 120 mg by mouth daily.    . furosemide (LASIX) 20 MG tablet Take 1 tablet (20 mg total) by mouth 2 (two) times daily as needed. 180 tablet 3  . HYDROcodone-acetaminophen (NORCO) 10-325 MG per tablet Take 1 tablet by mouth every 6 (six) hours as needed. (Patient not taking: Reported on 04/08/2015) 120 tablet 0  . HYDROcodone-acetaminophen (NORCO) 10-325 MG per tablet Take 1 tablet by mouth every 6 (six) hours as needed. (Patient not taking: Reported on 04/08/2015) 120 tablet 0  . metoprolol succinate (TOPROL-XL)  50 MG 24 hr tablet Take 1 tablet (50 mg total) by mouth daily. Take with or immediately following a meal. (Patient not taking: Reported on 04/08/2015) 90 tablet 3  . potassium chloride (K-DUR) 10 MEQ tablet Take 1 tablet (10 mEq total) by mouth 2 (two) times daily as needed. 180 tablet 3  . SAW PALMETTO-PHYTOSTEROLS PO Take 320 mg by mouth daily.    . mupirocin cream (BACTROBAN) 2 % Apply 1 application topically 2 (two) times daily. (Patient not taking: Reported on 04/08/2015) 15 g 0  . sulfamethoxazole-trimethoprim (BACTRIM DS,SEPTRA DS) 800-160 MG per tablet Take 1 tablet by mouth 2 (two) times daily. (Patient not taking: Reported on 04/08/2015) 14 tablet 0   No facility-administered medications prior to visit.    Review of Systems;  Patient denies headache,  fevers, malaise, unintentional weight loss, skin rash, eye pain, sinus congestion and sinus pain, sore throat, dysphagia,  hemoptysis , cough, dyspnea, wheezing, chest pain, palpitations, orthopnea, edema, abdominal pain, nausea, melena, diarrhea, constipation, flank pain, dysuria, hematuria, urinary  Frequency, nocturia, numbness, tingling, seizures,  Focal weakness, Loss of consciousness,  Tremor, insomnia, depression, anxiety, and suicidal ideation.      Objective:  BP 140/80 mmHg  Pulse 76  Temp(Src) 98.3 F (36.8 C) (Oral)  Resp 14  Ht  (1.905 m)  Wt 354 lb 8 oz (160.8 kg)  BMI 44.31 kg/m2  SpO2 95%  BP Readings from Last 3 Encounters:  04/08/15 110/72  04/08/15 140/80  03/10/15 141/84    Wt Readings from Last 3 Encounters:  04/08/15 354 lb 4 oz (160.687 kg)  04/08/15 354 lb 8 oz (160.8 kg)  03/10/15 350 lb (158.759 kg)    General appearance: alert, cooperative and appears stated age Ears: normal TM's and external ear canals both ears Throat: lips, mucosa, and tongue normal; teeth and gums normal Neck: no adenopathy, no carotid bruit, supple, symmetrical, trachea midline and thyroid not enlarged, symmetric, no tenderness/mass/nodules Back: symmetric, no curvature. ROM normal. No CVA tenderness. Lungs: clear to auscultation bilaterally Heart: regular rate and rhythm, S1, S2 normal, no murmur, click, rub or gallop Abdomen: soft, non-tender; bowel sounds normal; no masses,  no organomegaly Pulses: 2+ and symmetric Skin: Skin color, texture, turgor normal. No rashes or lesions Lymph nodes: Cervical, supraclavicular, and axillary nodes normal.  No results found for: HGBA1C  Lab Results  Component Value Date   CREATININE 1.2 04/30/2014   CREATININE 0.9 01/28/2014   CREATININE 0.93 08/01/2013    Lab Results  Component Value Date   WBC 7.3 04/08/2015   HGB 14.9 04/08/2015   HCT 44.2 04/08/2015   PLT 130.0* 04/08/2015   GLUCOSE 80 01/28/2014   CHOL 126  07/09/2013   TRIG 97 07/09/2013   HDL 50 07/09/2013   LDLCALC 57 07/09/2013   ALT 26 04/30/2014   AST 23 04/30/2014   NA 143 04/30/2014   K 4.4 04/30/2014   CL 109 01/28/2014   CREATININE 1.2 04/30/2014   BUN 21 11/07/2014   CO2 24 01/28/2014   TSH 1.73 04/08/2015   INR 1.2 07/16/2013    Dg Foot Complete Left  03/10/2015   CLINICAL DATA:  Puncture wound, stepped on nail 2 days ago  EXAM: LEFT FOOT - COMPLETE 3+ VIEW  COMPARISON:  None.  FINDINGS: Three views of the left foot submitted. No acute fracture or subluxation. No radiopaque foreign body. There is soft tissue swelling distal metatarsal region. Plantar spur of calcaneus. Mild dorsal spurring tarsal region.  IMPRESSION: No acute fracture or subluxation. No radiopaque foreign body. Degenerative changes as described above. Soft tissue swelling distal metatarsal region.   Electronically Signed   By: Natasha MeadLiviu  Pop M.D.   On: 03/10/2015 13:08    Assessment & Plan:   Problem List Items Addressed This Visit      Unprioritized   Orthopnea    May be secondary to new onset cardiomyopathy or due to Afib  induced diastolic dysfunction causing heart failure       SOB (shortness of breath)    Significant incresae may be due to cardiomyopathy from Lymes disease       Relevant Orders   [facility]cardiac[facility]   Brain natriuretic peptide (Completed)   Troponin I (Completed)   CBC with Differential/Platelet (Completed)   TSH (Completed)   Erythema migrans (Lyme disease)    Starting doxycycline ,  Lymes  Ab titers ordered      Relevant Medications   doxycycline (VIBRAMYCIN) 100 MG capsule   PAF (paroxysmal atrial fibrillation)    Currently in atrial fibrillation, not rate controlled.  May be the source of his orthopnea or caused by new onset cardiomyopathy.  Cardiology.ECHO ordered      Relevant Medications   nitroGLYCERIN (NITROSTAT) 0.4 MG SL tablet    Other Visit Diagnoses    Uncontrolled atrial fibrillation     -  Primary    Relevant Medications    nitroGLYCERIN (NITROSTAT) 0.4 MG SL tablet    Other Relevant Orders    EKG 12-Lead (Completed)    [facility] and [facility]MB [facility]card[facility]    Tick bite of abdomen, initial encounter        Relevant Orders    Ehrlichia antibody panel    Rocky mtn spotted fvr abs pnl(IgG+IgM)    Lyme Ab/Western Blot Reflex      A total of 25 minutes was spent with patient more than half of which was spent in counseling patient on the above mentioned issues , reviewing and explaining recent labs and imaging studies done, and coordination of care.  I have discontinued Johnathan [name]'s sulfamethoxazole-trimethoprim. I am also having him start on nitroGLYCERIN and doxycycline. Additionally, I am having him maintain his FERROUS GLUCONATE PO, multivitamin, B Complex Vitamins (B COMPLEX 50 PO), vitamin B-12, Vitamin D3, GLUCOSAMINE HCL-MSM PO, vitamin E, gabapentin, meloxicam, losartan-hydrochlorothiazide, buPROPion, HYDROcodone-acetaminophen, and diltiazem.  Meds ordered this encounter  Medications  . nitroGLYCERIN (NITROSTAT) 0.4 MG SL tablet    Sig: Place 1 tablet (0.4 mg total) under the tongue every 5 (five) minutes as needed for chest pain.    Dispense:  30 tablet    Refill:  3  . doxycycline (VIBRAMYCIN) 100 MG capsule    Sig: Take 1 capsule (100 mg total) by mouth 2 (two) times daily.    Dispense:  20 capsule    Refill:  0    Medications Discontinued During This Encounter  Medication Reason  . sulfamethoxazole-trimethoprim (BACTRIM DS,SEPTRA DS) 800-160 MG per tablet Completed Course    Follow-up: No Follow-up on file.   Sherlene ShamsULLO, Kinzee Happel L, MD

## 2015-04-08 NOTE — Progress Notes (Addendum)
Cardiology Office Note:  Date of Encounter: 04/08/2015  ID: Johnathan [name], DOB [birth year], MRN [id number]  PCP:  Sherlene Shams, MD Primary Cardiologist:  Dr. Mariah Milling, MD  Chief Complaint  Patient presents with  . other    Dr. Darrick Huntsman would like patient evaluated for abnormal EKG. Pt. c/o being bit by a tick 18 days ago, has shortness of breath, irreg. heart beat, dizziness, has a burning across diaphragm exteme fatigue.     HPI:  59 year old male with history of PAF dating back to August 2013 not on longterm daily full dose oral anticoagulation, HTN, obesity s/p bariatric surgery, OSA noncompliant with CPAP, history of DVT, and chronic joint pain of the shoulders and knees on long term low dose narcotics who presents from his PCP office today found to be in a-fib.   He was orginally diagnosed with a-fib in August 2013 hospitalization with left-sided chest pain and palpitations. He was given adenosine in the emergency room that showed atrial fibrillation. He received Cardizem and started on diltiazem infusion and converted to normal sinus rhythm. He was started on anticoagulation at that time and continued on long-acting Cardizem and was discharged after echocardiogram showed normal LV function with EF of 50-55%, normal right ventricular systolic pressure, normal left atrium. TSH 1.7 at that time. Follow up event monitor September 2013 showed NSR with PVCs, no significant atrial fibrillation. Anticoagulation was held at this time (06/2012). CHADSVASc calculates out to be 2 (HTN and vascular disease). He reports palpitations when hot that improve when he cools down.   At his last office visit with Dr. Mariah Milling on 01/08/2015 he had been feeling increasing SOB, unable to take more than 10-15 steps without breathing problem. Weight was up 15 pounds from his prior clinic visit August 2015. He was no longer taking Lasix, potassium, or metoprolol. He was not exercising regularly. No anginal symptoms. He  was restarted on Lasix for 2 weeks and advised to decrease po fluid intake. There was also some concern for increased episodes of a-fib vs PVCs with sinus tachycardia. It was recommended he restart his metoprolol. In follow up with PCP at the end of April his Cardizem was discontinued the setting of his lower extremity edema and he was changed from Lopressor to Toprol XL 100 mg bid. He reports not tolerating this change and has remained on Cardizem CD 240 mg daily and is not taking Toprol XL at all.   He recently had a tick bite 2 weeks prior that developed erythema migrans 2 days ago along right lateral flank and presented to his PCP today for evaluation. While at his PCP he was noted to be in a-fib with RVR with heart rate of 142 on EKG. He was placed on doxycycline for his erythema migrans and sent to cardiology for further treatment of his a-fib.   Upon his arrival to our office he remained in a-fib with RVR with a heart rate in the 130's. He was mildly SOB.  He noted increased intermittent dyspnea and a burning sensation across his upper abdomen for the past 10-12 weeks. At times he has been able to ambulate without issues most recently this past weekend, then at other times he will have to stop frequently and take breaks being able to only ambulate 10-15 feet at a time 2/2 SOB. Now noting some SOB with rest. Has baseline orthopnea, sleeps in a recliner longstanding. No chest pain, diaphoresis, nausea, vomiting, presyncope, or syncope. No palpitations or "tachycardia."  Weight gain of 5 pounds from his last office visit on 01/08/2015. He has been limiting the amount of po fluids. He is only taking his Lasix 20 mg once daily as he cannot go to the restroom often while at work so he takes it upon getting home.      Past Medical History  Diagnosis Date  . Hypertension   . B12 deficiency   . Arthritis     a. chronic joint pain  . Obesity   . History of DVT (deep vein thrombosis) 2004    s/p bariatric  surgery  . PAF (paroxysmal atrial fibrillation)     a. CHADSVASc at least 2 (HTN and vascular disease); b. on Eliquis   . OSA (obstructive sleep apnea)     a. not compliant with CPAP  . History of echocardiogram     a. echo 2013: EF of 50-55%, normal right ventricular systolic pressure, normal left atrium  :  Past Surgical History  Procedure Laterality Date  . Bariatric surgery  2004    Rou en Y   . Arthroscopic repair acl  1993    bilateral knee  . Cholecystectomy  1982  . Meniscus debridement  2011    Left knee  . Replacement total knee Left   . Bariatric surgery    :  Social History:  The patient  reports that he has never smoked. He has never used smokeless tobacco. He reports that he drinks about 1.2 oz of alcohol per week. He reports that he does not use illicit drugs.   Family History  Problem Relation Age of Onset  . Family history unknown: Yes     Allergies:  No Known Allergies   Home Medications:  Current Outpatient Prescriptions  Medication Sig Dispense Refill  . nitroGLYCERIN (NITROSTAT) 0.4 MG SL tablet Place 1 tablet (0.4 mg total) under the tongue every 5 (five) minutes as needed for chest pain. 30 tablet 3  . apixaban (ELIQUIS) 5 MG TABS tablet Take 1 tablet (5 mg total) by mouth 2 (two) times daily. 180 tablet 3  . B Complex Vitamins (B COMPLEX 50 PO) Take by mouth daily.    Marland Kitchen buPROPion (WELLBUTRIN XL) 300 MG 24 hr tablet Take 1 tablet by mouth  daily 90 tablet 0  . Cholecalciferol (VITAMIN D3) 2000 UNITS TABS Take by mouth daily.    . digoxin (LANOXIN) 0.25 MG tablet Take 1 tablet (0.25 mg total) by mouth daily. 90 tablet 3  . diltiazem (DILACOR XR) 240 MG 24 hr capsule Take 1 capsule by mouth  daily 90 capsule 1  . doxycycline (VIBRAMYCIN) 100 MG capsule Take 1 capsule (100 mg total) by mouth 2 (two) times daily. 20 capsule 0  . FERROUS GLUCONATE PO Take by mouth daily.    . furosemide (LASIX) 40 MG tablet Take 1 tablet (40 mg total) by mouth daily.  90 tablet 3  . gabapentin (NEURONTIN) 100 MG capsule Take 1 capsule (100 mg total) by mouth 3 (three) times daily. 270 capsule 3  . GLUCOSAMINE HCL-MSM PO Take by mouth daily.    Marland Kitchen HYDROcodone-acetaminophen (NORCO) 10-325 MG per tablet Take 1 tablet by mouth every 6 (six) hours as needed. 120 tablet 0  . losartan-hydrochlorothiazide (HYZAAR) 50-12.5 MG per tablet Take 1 tablet by mouth  daily 90 tablet 1  . meloxicam (MOBIC) 15 MG tablet Take 1 tablet by mouth  every day 90 tablet 2  . metoprolol succinate (TOPROL-XL) 50 MG 24 hr tablet Take  1 tablet (50 mg total) by mouth 2 (two) times daily. Take with or immediately following a meal. 180 tablet 3  . Multiple Vitamin (MULTIVITAMIN) tablet Take 1 tablet by mouth daily.    . potassium chloride 20 MEQ TBCR Take 20 mEq by mouth daily. 90 tablet 3  . vitamin B-12 (CYANOCOBALAMIN) 500 MCG tablet Take 500 mcg by mouth daily.    . vitamin E 400 UNIT capsule Take 400 Units by mouth daily.     No current facility-administered medications for this visit.     Review of Systems:  Review of Systems  Constitutional: Positive for malaise/fatigue. Negative for fever, chills, weight loss and diaphoresis.  Eyes: Negative for photophobia, discharge and redness.  Respiratory: Positive for shortness of breath. Negative for cough, hemoptysis, sputum production and wheezing.   Cardiovascular: Positive for orthopnea and leg swelling. Negative for chest pain, palpitations, claudication and PND.  Gastrointestinal: Negative for heartburn, nausea, vomiting, abdominal pain, blood in stool and melena.  Genitourinary: Negative for hematuria.  Musculoskeletal: Positive for myalgias and joint pain. Negative for falls.  Skin: Positive for rash. Negative for itching.       Erythema migrans right flank   Neurological: Positive for weakness. Negative for dizziness, sensory change, speech change, focal weakness, loss of consciousness and headaches.  Endo/Heme/Allergies: Does  not bruise/bleed easily.  Psychiatric/Behavioral: The patient is not nervous/anxious.   All other systems reviewed and are negative.    Physical Exam:  Blood pressure 110/72, pulse 131, height  (1.905 m), weight 354 lb 4 oz (160.687 kg). Body mass index is 44.28 kg/(m^2). General: Pleasant, NAD. Psych: Normal affect. Responds to questions with normal affect.  Neuro: Alert and oriented X 3. Moves all extremities spontaneously. HEENT: Normocephalic, atraumatic. EOM intact. Sclera anicteric.  Neck: Trachea midline. Supple without bruits or JVD. Lungs:  Respirations regular and unlabored. CTA bilaterally without wheezing, crackles, or rhonchi.  Heart: Tachycardic, irregularly-irregular, normal s3, s4. No murmurs, rubs, or gallops.  Abdomen: Obese, soft, non-tender, non-distended, BS + x 4.  Extremities: No clubbing or cyanosis. 2+ pitting edema to the bilateral mid calves. DP/PT/Radials 2+ and equal bilaterally.   Accessory Clinical Findings:  EKG - a-fib with RVR, 131 bpm, rare PVC, no significant st/t changes   Other studies Reviewed: Additional studies/ records that were reviewed today include: prior cardiology and PCP notes.   Recent Labs: 04/30/2014: ALT 26; Creatinine 1.2; Potassium 4.4; Sodium 143 11/07/2014: BUN 21 04/08/2015: Hemoglobin 14.9; Platelets 130.0*; Pro B Natriuretic peptide (BNP) 432.0*; TSH 1.73    Lipid Panel    Component Value Date/Time   CHOL 126 07/09/2013   CHOL 133 07/14/2012 0850   TRIG 97 07/09/2013   HDL 50 07/09/2013   HDL 39* 07/14/2012 0850   CHOLHDL 3.4 07/14/2012 0850   LDLCALC 57 07/09/2013   LDLCALC 79 07/14/2012 0850     Weights: Wt Readings from Last 3 Encounters:  04/08/15 354 lb 4 oz (160.687 kg)  04/08/15 354 lb 8 oz (160.8 kg)  03/10/15 350 lb (158.759 kg)     Assessment & Plan:  1. A-fib with RVR: -Rate control to goal of 70 bpm at rest, 90 bpm with ambulation  -Restart Toprol XL 50 mg bid (has not been  taking) -Add digoxin 0.25 mg daily loading, plan to decrease to 0.125 mg daily likely  -Continue Cardizem CD 240 mg daily  -Add Eliquis 5 mg bid, risks and benefits of long term, full dose anticoagulation were discussed in detail with  him. He understand the risks and benefits and he agrees to start anticoagulation at this time -Stop aspirin 81 mg at this time as there is no need for dual therapy with full dose anticoagulation  -CHADSVASc at least 2 (HTN and vascular disease with history of DVT - in the setting of bariatric surgery) -It is difficult to assess if the symptoms he has been experiencing dating back 10-12 weeks ago with increased SOB, generalized fatigue, and malaise were related to increased burden of PAF at that time vs other etiology such as acute on chronic diastolic CHF vs ischemia vs deconditioning   -Given his increased symptoms predate the recent tick borne exposure will need to evaluate each -Will check echo to evaluate LV function and wall motion in the setting of the above as well as for 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 Ischemic changes could also predispose him to increased a-fib burden  -He is admits to not being compliant with his CPAP, this will lead to increased a-fib burden   2. Acute on chronic diastolic CHF: -His increased lower extremity swelling may be multifactorial, including loss of his atrial kick, lack of medication compliance only taking Lasix 20 mg once daily, and possibly secondary to diltiazem  -Have patient take Lasix 40 mg every afternoon, increase KCl accordingly  -Restart Toprol XL as above -Continue Losartan/HCTZ -Limit po salt and fluid intake  -Compression hose  -BNP pending per PCP  3. Tick bite: -On doxycycline per  PCP -Tick borne illness titers are pending  4. OSA: -Not compliant with CPAP -Compliance advised   5. Obesity: -Weight loss advised  6. HTN: -BP well controlled -Toprol XL added as above -Continue current medications   Dispo: -Follow up 24 hours via My Chart with heart rate and pulse for titration of medications. If doing well plan to follow up in 1 month with Dr. Mariah MillingGollan, MD or myself  -BMET and CBC pending    Eula Listenyan Emmamarie Kluender, PA-C [facility] [address] [place], [place] [address] [phone] [facility] 04/08/2015, 12:23 PM

## 2015-04-08 NOTE — Assessment & Plan Note (Signed)
Significant incresae may be due to cardiomyopathy from Lymes disease

## 2015-04-08 NOTE — Assessment & Plan Note (Signed)
Currently in atrial fibrillation, not rate controlled.  May be the source of his orthopnea or caused by new onset cardiomyopathy.  Cardiology.ECHO ordered

## 2015-04-09 ENCOUNTER — Telehealth: Payer: Self-pay | Admitting: Physician Assistant

## 2015-04-09 LAB — CBC WITH DIFFERENTIAL/PLATELET
BASOS ABS: 0 10*3/uL (ref 0.0–0.2)
BASOS: 0 %
EOS (ABSOLUTE): 0.1 10*3/uL (ref 0.0–0.4)
Eos: 2 %
Hematocrit: 43 % (ref 37.5–51.0)
Hemoglobin: 14.2 g/dL (ref 12.6–17.7)
IMMATURE GRANS (ABS): 0 10*3/uL (ref 0.0–0.1)
Immature Granulocytes: 0 %
Lymphocytes Absolute: 2 10*3/uL (ref 0.7–3.1)
Lymphs: 28 %
MCH: 30.5 pg (ref 26.6–33.0)
MCHC: 33 g/dL (ref 31.5–35.7)
MCV: 92 fL (ref 79–97)
Monocytes Absolute: 0.8 10*3/uL (ref 0.1–0.9)
Monocytes: 12 %
NEUTROS PCT: 58 %
Neutrophils Absolute: 4.1 10*3/uL (ref 1.4–7.0)
Platelets: 130 10*3/uL — ABNORMAL LOW (ref 150–379)
RBC: 4.66 x10E6/uL (ref 4.14–5.80)
RDW: 14.3 % (ref 12.3–15.4)
WBC: 7.1 10*3/uL (ref 3.4–10.8)

## 2015-04-09 LAB — BASIC METABOLIC PANEL
BUN / CREAT RATIO: 26 — AB (ref 9–20)
BUN: 25 mg/dL — ABNORMAL HIGH (ref 6–24)
CHLORIDE: 105 mmol/L (ref 97–108)
CO2: 21 mmol/L (ref 18–29)
Calcium: 8.8 mg/dL (ref 8.7–10.2)
Creatinine, Ser: 0.98 mg/dL (ref 0.76–1.27)
GFR calc non Af Amer: 84 mL/min/{1.73_m2} (ref >59)
GFR, EST AFRICAN AMERICAN: 97 mL/min/{1.73_m2} (ref >59)
Glucose: 92 mg/dL (ref 65–99)
POTASSIUM: 4.5 mmol/L (ref 3.5–5.2)
SODIUM: 142 mmol/L (ref 134–144)

## 2015-04-09 LAB — CK TOTAL AND CKMB (NOT AT ARMC): CK TOTAL: 119 U/L (ref 7–232)

## 2015-04-09 LAB — LYME AB/WESTERN BLOT REFLEX: B burgdorferi Ab IgG+IgM: 0.73 {ISR}

## 2015-04-09 NOTE — Telephone Encounter (Signed)
Spoke w/ pt's wife.  Advised her of [name]'s recommendation.  She states that pt has been SOBOE for several months, but did not seek care.  She reports that pt is upset about the amount of new meds he was prescribed and about the cost.  She asks that I not send in the diltiazem 30 mg until she speaks w/ him about the cost.  She will call back if she changes her mind.

## 2015-04-09 NOTE — Telephone Encounter (Signed)
Patient is going to be some SOB. He is in a-fib with RVR.  -What is his HR?  -He should be taking Cardizem CD 240 mg, Toprol XL 50 mg bid, and digoxin 0.25 mg currently. -If he continues to be tachycardic we can add diltiazem 30 mg q 6 hours prn hear rate >110, hold for SBP <110 -He needs to take it easy at home. He is going to get SOB easily.  -If he continues to feel symptomatic another option is to go to the ER, get admitted for possible TEE/DCCV (though would be unlikely to happen in the tomorrow morning as anesthesia will have already made their schedule by this time so he could be set up for TEE/DCCV for Friday). -Would advise against antiarrhythmics at this time given we do not know how long he has been in a-fib.

## 2015-04-09 NOTE — Telephone Encounter (Signed)
Pt c/o Shortness Of Breath: STAT if SOB developed within the last 24 hours or pt is noticeably SOB on the phone  1. Are you currently SOB (can you hear that pt is SOB on the phone)? Yes heart rate goes crazy bp is up too   2. How long have you been experiencing SOB? A while last month worse   3. Are you SOB when sitting or when up moving around? Up when moving around per wife not sure about when at rest   4. Are you currently experiencing any other symptoms? Pain in diaphragm

## 2015-04-10 ENCOUNTER — Other Ambulatory Visit: Payer: Self-pay

## 2015-04-10 DIAGNOSIS — Z79899 Other long term (current) drug therapy: Principal | ICD-10-CM

## 2015-04-10 DIAGNOSIS — Z5181 Encounter for therapeutic drug level monitoring: Secondary | ICD-10-CM

## 2015-04-10 LAB — EHRLICHIA ANTIBODY PANEL

## 2015-04-10 LAB — ROCKY MTN SPOTTED FVR ABS PNL(IGG+IGM)
RMSF IGM: 0.26 IV
RMSF IgG: 0.21 IV

## 2015-04-11 ENCOUNTER — Encounter: Payer: Self-pay | Admitting: Internal Medicine

## 2015-04-15 ENCOUNTER — Other Ambulatory Visit (INDEPENDENT_AMBULATORY_CARE_PROVIDER_SITE_OTHER): Payer: 59

## 2015-04-15 DIAGNOSIS — Z5181 Encounter for therapeutic drug level monitoring: Secondary | ICD-10-CM

## 2015-04-15 DIAGNOSIS — Z79899 Other long term (current) drug therapy: Secondary | ICD-10-CM

## 2015-04-16 LAB — DIGOXIN LEVEL: Digoxin, Serum: <0.4 ng/mL — ABNORMAL LOW (ref 0.9–2.0)

## 2015-04-17 ENCOUNTER — Telehealth: Payer: Self-pay

## 2015-04-17 NOTE — Telephone Encounter (Signed)
Left message for pt to call back to reschedule lexi to 2 day instead of 1 day.

## 2015-04-17 NOTE — Telephone Encounter (Signed)
Spoke w/ Abby in nuc med. Pt's wife called her and explained that pt cannot get off work 2 days. Per Dr. Kirke CorinArida, ok to proceed w/ 2 day lexi w/ 2nd day on Sat. Abby to call pt's wife back and notify her.

## 2015-04-17 NOTE — Telephone Encounter (Signed)
Johnathan RudSabrina F Gilley at 04/17/2015 10:35 AM             Pt wife called, stated pt received a msg stating his test for tomorrow needs to be cancelled. She needs to know more information regarding this. If possible, she requests a call before noon.

## 2015-04-17 NOTE — Telephone Encounter (Signed)
Spoke w/ pt's wife.  Advised her that pt will need a 2 day lexi, as his wt is > 300 lbs.  She is upset, as she states that pt works through a [employer] and will lost his job if he takes too much time off.  Advised her that this is hospital protocol and I cannot override this.  She will speak w/ her husband and find out when he can reschedule this, but would like to leave ECHO on for tomorrow if possible.  She will call back after she has discussed w/ pt.

## 2015-04-17 NOTE — Telephone Encounter (Signed)
Pt wife called, stated pt received a msg stating his test for tomorrow needs to be cancelled. She needs to know more information regarding this. If possible, she requests a call before noon.

## 2015-04-17 NOTE — Telephone Encounter (Signed)
Please see previous note.

## 2015-04-18 ENCOUNTER — Encounter
Admission: RE | Admit: 2015-04-18 | Discharge: 2015-04-18 | Disposition: A | Discharge status: Home / Self Care | Payer: 59 | Source: Ambulatory Visit | Attending: Physician Assistant | Admitting: Physician Assistant

## 2015-04-18 ENCOUNTER — Other Ambulatory Visit: Payer: Self-pay

## 2015-04-18 ENCOUNTER — Ambulatory Visit (INDEPENDENT_AMBULATORY_CARE_PROVIDER_SITE_OTHER): Payer: 59

## 2015-04-18 DIAGNOSIS — R9431 Abnormal electrocardiogram [ECG] [EKG]: Secondary | ICD-10-CM

## 2015-04-18 DIAGNOSIS — R0602 Shortness of breath: Secondary | ICD-10-CM | POA: Diagnosis not present

## 2015-04-18 DIAGNOSIS — I259 Chronic ischemic heart disease, unspecified: Secondary | ICD-10-CM | POA: Diagnosis not present

## 2015-04-18 DIAGNOSIS — R079 Chest pain, unspecified: Secondary | ICD-10-CM

## 2015-04-18 MED ORDER — TECHNETIUM TC 99M SESTAMIBI - CARDIOLITE
30.0000 | Freq: Once | INTRAVENOUS | Status: AC | PRN
  Administered 2015-04-18: 09:00:00 30.25 via INTRAVENOUS

## 2015-04-18 MED ORDER — REGADENOSON 0.4 MG/5ML IV SOLN
0.4000 mg | Freq: Once | INTRAVENOUS | Status: AC
  Administered 2015-04-18: 0.4 mg via INTRAVENOUS

## 2015-04-19 MED ORDER — TECHNETIUM TC 99M SESTAMIBI - CARDIOLITE
31.8210 | Freq: Once | INTRAVENOUS | Status: AC | PRN
  Administered 2015-04-19: 07:00:00 31.821 via INTRAVENOUS

## 2015-04-21 LAB — NM MYOCAR MULTI W/SPECT W/WALL MOTION / EF
CSEPHR: 70 %
LV sys vol: 188 mL
LVDIAVOL: 261 mL
Peak HR: 114 {beats}/min
Rest HR: 104 {beats}/min
SDS: 0
SRS: 1
SSS: 0
TID: 1.29

## 2015-04-22 ENCOUNTER — Encounter: Payer: Self-pay | Admitting: Physician Assistant

## 2015-04-22 ENCOUNTER — Telehealth: Payer: Self-pay | Admitting: *Deleted

## 2015-04-22 NOTE — Telephone Encounter (Signed)
Here are the patients best times to call him  Anytime after 4  Goes to break at 2 pm  Lunch at 12-12:30  Please call them around these times.

## 2015-04-22 NOTE — Telephone Encounter (Signed)
This is to discuss cardiac cath. I am happy to discuss with her also however I do need to talk with the actual patient as we need to go over risks, benefits, meds, and consent.   I will do my best to call the patient around the times that work for him and call the wife, should he be ok with this, and if she is on his HIPAA.

## 2015-04-22 NOTE — Telephone Encounter (Signed)
Pt's wife, Alfredo BattyKathryn Ayer, is on his DPR to discuss.

## 2015-04-22 NOTE — Telephone Encounter (Signed)
Per wife  Patient suggests it might be better to give her the results as she works at a desk and is easier to get in touch with   [phone]  Johnathan BattyKathryn Lacap

## 2015-04-22 NOTE — Telephone Encounter (Signed)
Johnathan [name], Dr. Mariah MillingGollan will review pt's stress pics and see if we can add him on to his schedule tomorrow at 4:00 or at lunchtime on Thurs.  Hold off on calling pt until he tells me what to do.

## 2015-04-23 NOTE — Telephone Encounter (Signed)
Reviewed stress test images with Dr. Mariah MillingGollan. Images appeared to show fixed apical thinning. Would pursue treating Afib at this time via rate control x 3 weeks followed by chemical cardioversion in 3 weeks time via short term amiodarone given his age. Should his symptoms persist would then pursue cardiac cath. After he has been on Eliquis 5 mg bid for at least 3 weeks have patient call us so we can send in amiodarone for possible chemical cardioversion.  He must be taking the Eliquis 5 mg bid and he must have been on this for at least 3 weeks.

## 2015-04-24 NOTE — Telephone Encounter (Signed)
i have not called him. Can you please give him a call?

## 2015-04-24 NOTE — Telephone Encounter (Signed)
Spoke w/ pt.  Advised him of [name]'s recommendation.  He reports that Monday, July 18 will be end of week 2 on the Eliquis BID. He does report that he will be unable to afford Eliquis long term, he was given a month's worth of samples and applied for pt assistance, but has not heard back.  Advised him that we can provide samples as long as we have them.  Of note, pt would like to report that Digoxin has made a "world of difference" and has "not had anymore wild afib swings". He will anticipate a call from our office when the amiodarone is called in.

## 2015-04-24 NOTE — Telephone Encounter (Signed)
Pt wife calling asking if we can call pt and or wife if we can't get a hold of pt.

## 2015-04-24 NOTE — Telephone Encounter (Signed)
-  So, is he stating July 25th will be 3 weeks total on Eliquis 5 mg bid?  -Would Xarelto be cheaper for him? Can he check on this? -If he states July 25th will be 3 weeks on Eliquis bid lets plan to have him come in for nurse only visit for an EKG at his availability to verify he is still in Afib. If so, we will send in amiodarone.   We need a cmet prior to him starting this.   He will need to see his eye MD and a pulm MD.

## 2015-04-25 MED ORDER — DIGOXIN 250 MCG PO TABS: 0.2500 mg | ORAL_TABLET | Freq: Every day | ORAL | Status: DC

## 2015-04-25 NOTE — Telephone Encounter (Signed)
Pt wife calling asking if we can call her she would like some more clarification, she would like us to call her about below.  Please call her at work [phone]  Also she needs us to refill medication.   1. Which medications need to be refilled? Digoxin  2. Which pharmacy is medication to be sent to? [facility]  3. Do they need a 30 day or 90 day supply? 90   4. Would they like a call back once the medication has been sent to the pharmacy? No

## 2015-04-25 NOTE — Telephone Encounter (Signed)
Spoke w/ pt.  Advised him that I spoke w/ Dr. Mariah MillingGollan and he recommends that pt continue on Eliquis 5 mg BID for at least a month. Advised him that I am leaving samples at the front desk, along w/ a coupon, for him to p/u at his convenience.  Advised him that I am also sending in his digoxin refills to [facility] for mail order. He is appreciative, will try to p/u samples on Monday, and will keep appt w/ Dr. Mariah MillingGollan on 05/21/15.

## 2015-04-29 ENCOUNTER — Telehealth: Payer: Self-pay | Admitting: Cardiovascular Disease

## 2015-04-29 NOTE — Telephone Encounter (Signed)
Wife came to office to pick up Eliquis samples and says patient is tired and continues to be SOB as he was upon his last visit.

## 2015-04-30 NOTE — Telephone Encounter (Signed)
Forward to [name] Dunn, PA-C 

## 2015-05-03 NOTE — Telephone Encounter (Signed)
Patient's symptoms could be 2/2 ischemia vs intolerance to Afib. We need to definitively rule out ischemia at this time given his persistent symptoms via radial cath. If cardiac cath is ok would manage Afib further given ruled out ischemia.   Please schedule patient for cardiac cath with Dr. Kirke Corin. Dx: abnormal nuclear stress test.

## 2015-05-05 NOTE — Telephone Encounter (Signed)
Left message on machine for patient to contact the office.   

## 2015-05-08 NOTE — Telephone Encounter (Signed)
Left message on pt VM with CB number

## 2015-05-09 NOTE — Telephone Encounter (Signed)
Left voice mail on both mobile and work numbers requesting pt to call office Mailed letter to pt for him to contact office regarding cardiac cath

## 2015-05-13 ENCOUNTER — Telehealth: Payer: Self-pay

## 2015-05-13 NOTE — Telephone Encounter (Signed)
Pt agrees to keep appt on 8/10 to discuss possible cath.  It is difficult for him to get off of work and Dr. Mariah Milling wanted him to be on Eliquis for a month before the cathed him.

## 2015-05-13 NOTE — Telephone Encounter (Signed)
S/w pt who states he received MyChart letter regarding need for catheterization. States he was unaware and would like to know what Dr. Windell Hummingbird thoughts are on this procedure. Apologized for the confusion and reviewed [name]'s recommendations.  States he has seen Eula Listen, PA-C recently and feels like he orders "every test under the sun" which costs him money and time off work. Pt indicates he understood the plan per Dr. Mariah Milling was to take Eliquis until his August 10th appt then change over to another medication at his OV. Pt asks that I have Dr. Mariah Milling review his results and make recommendations.  Will forward to MD to review.

## 2015-05-20 ENCOUNTER — Encounter: Payer: Self-pay | Admitting: Internal Medicine

## 2015-05-20 ENCOUNTER — Ambulatory Visit (INDEPENDENT_AMBULATORY_CARE_PROVIDER_SITE_OTHER): Payer: 59 | Admitting: Internal Medicine

## 2015-05-20 VITALS — BP 124/70 | HR 80 | Temp 97.8°F | Resp 14 | Ht 75.0 in | Wt 352.2 lb

## 2015-05-20 DIAGNOSIS — G4733 Obstructive sleep apnea (adult) (pediatric): Secondary | ICD-10-CM

## 2015-05-20 DIAGNOSIS — M1990 Unspecified osteoarthritis, unspecified site: Secondary | ICD-10-CM | POA: Diagnosis not present

## 2015-05-20 DIAGNOSIS — R600 Localized edema: Secondary | ICD-10-CM

## 2015-05-20 DIAGNOSIS — I519 Heart disease, unspecified: Secondary | ICD-10-CM

## 2015-05-20 MED ORDER — CEPHALEXIN 500 MG PO CAPS: 500.0000 mg | ORAL_CAPSULE | Freq: Four times a day (QID) | ORAL | Status: DC

## 2015-05-20 MED ORDER — HYDROCODONE-ACETAMINOPHEN 10-325 MG PO TABS: 1.0000 | ORAL_TABLET | Freq: Four times a day (QID) | ORAL | Status: DC | PRN

## 2015-05-20 MED ORDER — TRIAMCINOLONE ACETONIDE 0.1 % EX CREA: 1.0000 "application " | TOPICAL_CREAM | Freq: Two times a day (BID) | CUTANEOUS | Status: DC

## 2015-05-20 NOTE — Patient Instructions (Signed)
I recommend trying triamcinolone cream on your legs twice daily,  And using generic benadryl for itching  Add the cephalexin (antibiotic) if the redness spreads  Or the bumps start to look infected

## 2015-05-20 NOTE — Progress Notes (Signed)
Subjective:  Patient ID: Johnathan [name], male    DOB: 29-May-1956  Age: 59 y.o. MRN: [id number]  CC: The primary encounter diagnosis was Systolic dysfunction. Diagnoses of Bilateral leg edema, OSA (obstructive sleep apnea), and Arthritis were also pertinent to this visit.  HPI Johnathan [name] presents for medication refill.  Has developed a pruritic rash on his lower extremities that he is concerned may be a drug rash from the Eliquis .    States that he never got a results call about the ECHO  And was told he needs a cardiac catheterization .  He is very concerned about the cost of his comprehensive workup and feels he has had too many tests  His dyspnea has improved with the addition of digoxin and he feels his LE edema has improved.   His low back and bilateral knee pain is unchanged.  He cannot function in his job without the use of vicodin and has been rationing his remaining tablets until his appointment .  He is managed the constipation with stool softener.   Outpatient Prescriptions Prior to Visit  Medication Sig Dispense Refill  . B Complex Vitamins (B COMPLEX 50 PO) Take by mouth daily.    Marland Kitchen buPROPion (WELLBUTRIN XL) 300 MG 24 hr tablet Take 1 tablet by mouth  daily 90 tablet 0  . Cholecalciferol (VITAMIN D3) 2000 UNITS TABS Take by mouth daily.    . digoxin (LANOXIN) 0.25 MG tablet Take 1 tablet (0.25 mg total) by mouth daily. 90 tablet 3  . FERROUS GLUCONATE PO Take by mouth daily.    . furosemide (LASIX) 40 MG tablet Take 1 tablet (40 mg total) by mouth daily. 90 tablet 3  . gabapentin (NEURONTIN) 100 MG capsule Take 1 capsule (100 mg total) by mouth 3 (three) times daily. 270 capsule 3  . GLUCOSAMINE HCL-MSM PO Take by mouth daily.    Marland Kitchen losartan-hydrochlorothiazide (HYZAAR) 50-12.5 MG per tablet Take 1 tablet by mouth  daily 90 tablet 1  . meloxicam (MOBIC) 15 MG tablet Take 1 tablet by mouth  every day 90 tablet 2  . Multiple Vitamin (MULTIVITAMIN) tablet Take 1 tablet  by mouth daily.    . nitroGLYCERIN (NITROSTAT) 0.4 MG SL tablet Place 1 tablet (0.4 mg total) under the tongue every 5 (five) minutes as needed for chest pain. 30 tablet 3  . potassium chloride 20 MEQ TBCR Take 20 mEq by mouth daily. 90 tablet 3  . vitamin B-12 (CYANOCOBALAMIN) 500 MCG tablet Take 500 mcg by mouth daily.    . vitamin E 400 UNIT capsule Take 400 Units by mouth daily.    Marland Kitchen apixaban (ELIQUIS) 5 MG TABS tablet Take 1 tablet (5 mg total) by mouth 2 (two) times daily. 180 tablet 3  . diltiazem (DILACOR XR) 240 MG 24 hr capsule Take 1 capsule by mouth  daily 90 capsule 1  . HYDROcodone-acetaminophen (NORCO) 10-325 MG per tablet Take 1 tablet by mouth every 6 (six) hours as needed. 120 tablet 0  . metoprolol succinate (TOPROL-XL) 50 MG 24 hr tablet Take 1 tablet (50 mg total) by mouth 2 (two) times daily. Take with or immediately following a meal. 180 tablet 3  . doxycycline (VIBRAMYCIN) 100 MG capsule Take 1 capsule (100 mg total) by mouth 2 (two) times daily. 20 capsule 0   No facility-administered medications prior to visit.    Review of Systems;  Patient denies headache, fevers, malaise, unintentional weight loss, skin rash, eye pain, sinus  congestion and sinus pain, sore throat, dysphagia,  hemoptysis , cough, dyspnea, wheezing, chest pain, palpitations, orthopnea, edema, abdominal pain, nausea, melena, diarrhea, constipation, flank pain, dysuria, hematuria, urinary  Frequency, nocturia, numbness, tingling, seizures,  Focal weakness, Loss of consciousness,  Tremor, insomnia, depression, anxiety, and suicidal ideation.      Objective:  BP 124/70 mmHg  Pulse 80  Temp(Src) 97.8 F (36.6 C) (Oral)  Resp 14  Ht 6\' 3"  (1.905 m)  Wt 352 lb 4 oz (159.78 kg)  BMI 44.03 kg/m2  SpO2 96%  BP Readings from Last 3 Encounters:  05/21/15 120/78  05/20/15 124/70  04/08/15 110/72    Wt Readings from Last 3 Encounters:  05/21/15 353 lb 8 oz (160.347 kg)  05/20/15 352 lb 4 oz (159.78  kg)  04/08/15 354 lb 4 oz (160.687 kg)    General appearance: alert, cooperative and appears stated age Ears: normal TM's and external ear canals both ears Throat: lips, mucosa, and tongue normal; teeth and gums normal Neck: no adenopathy, no carotid bruit, supple, symmetrical, trachea midline and thyroid not enlarged, symmetric, no tenderness/mass/nodules Back: symmetric, no curvature. ROM normal. No CVA tenderness. Lungs: clear to auscultation bilaterally Heart: regular rate and rhythm, S1, S2 normal, no murmur, click, rub or gallop Abdomen: soft, non-tender; bowel sounds normal; no masses,  no organomegaly Pulses: 2+ and symmetric Skin: Skin color, texture, turgor normal. No rashes or lesions Lymph nodes: Cervical, supraclavicular, and axillary nodes normal.  No results found for: HGBA1C  Lab Results  Component Value Date   CREATININE 0.98 04/08/2015   CREATININE 1.2 04/30/2014   CREATININE 0.9 01/28/2014    Lab Results  Component Value Date   WBC 7.1 04/08/2015   HGB 14.9 04/08/2015   HCT 43.0 04/08/2015   PLT 130.0* 04/08/2015   GLUCOSE 92 04/08/2015   CHOL 126 07/09/2013   TRIG 97 07/09/2013   HDL 50 07/09/2013   LDLCALC 57 07/09/2013   ALT 26 04/30/2014   AST 23 04/30/2014   NA 142 04/08/2015   K 4.5 04/08/2015   CL 105 04/08/2015   CREATININE 0.98 04/08/2015   BUN 25* 04/08/2015   CO2 21 04/08/2015   TSH 1.73 04/08/2015   INR 1.2 07/16/2013    Nm Myocar Multi W/spect W/wall Motion / Ef  04/21/2015    There was no ST segment deviation noted during stress.  Defect 1: There is a small defect of moderate severity present in the  apex location. This is suggestive of apical ischemia.  Findings consistent with ischemia.  This is an intermediate risk study.  The left ventricular ejection fraction is moderately decreased (30-44%).  Calculated EF was 21% but visually appears to be 35-40%.     Assessment & Plan:   Problem List Items Addressed This Visit       Unprioritized   OSA (obstructive sleep apnea)    diagnosed with prior sleep study but treatment has been deferred d by patient.  Discussed the long term history of OSA , the risks of long term damage to heart and the signs and symptoms attributable to OSA.  Advised patient to consider  significant weight loss and/or use of CPAP.       Arthritis    multiple joints , OA aggravated by morbid obesity .  Patient is using vicodin every 6 hours. Patient has not had an escalation of use of narcotics and is reminded not to share medication with others or to combine with alcohol or other sedating  medications.  Patient is providing urine sample upon request per narcotics contract, Refill on narcotic was given today.       Relevant Medications   HYDROcodone-acetaminophen (NORCO) 10-325 MG per tablet   Systolic dysfunction - Primary    Reviewed ECHO with patient and outlined the  Possible causes of his decreased EF, underscoring the need for a diagnostic catheterization       Bilateral leg edema    Multifactorial, with systolic dysfunction and VI. unable to wear compression stockings.  Continue daily lasix,          A total of 25 minutes of face to face time was spent with patient more than half of which was spent in counselling and coordination of care   I have discontinued Mr. Lovings doxycycline. I am also having him start on triamcinolone cream and cephALEXin. Additionally, I am having him maintain his FERROUS GLUCONATE PO, multivitamin, B Complex Vitamins (B COMPLEX 50 PO), vitamin B-12, Vitamin D3, GLUCOSAMINE HCL-MSM PO, vitamin E, gabapentin, meloxicam, losartan-hydrochlorothiazide, buPROPion, nitroGLYCERIN, furosemide, Potassium Chloride ER, digoxin, and HYDROcodone-acetaminophen.  Meds ordered this encounter  Medications  . triamcinolone cream (KENALOG) 0.1 %    Sig: Apply 1 application topically 2 (two) times daily.    Dispense:  30 g    Refill:  0  . cephALEXin (KEFLEX) 500 MG capsule     Sig: Take 1 capsule (500 mg total) by mouth 4 (four) times daily.    Dispense:  28 capsule    Refill:  0  . DISCONTD: HYDROcodone-acetaminophen (NORCO) 10-325 MG per tablet    Sig: Take 1 tablet by mouth every 6 (six) hours as needed.    Dispense:  120 tablet    Refill:  0  . DISCONTD: HYDROcodone-acetaminophen (NORCO) 10-325 MG per tablet    Sig: Take 1 tablet by mouth every 6 (six) hours as needed.    Dispense:  120 tablet    Refill:  0    May refill on or after  July 20 2015  . HYDROcodone-acetaminophen (NORCO) 10-325 MG per tablet    Sig: Take 1 tablet by mouth every 6 (six) hours as needed.    Dispense:  120 tablet    Refill:  0    May refill on or After June 20 2015    Medications Discontinued During This Encounter  Medication Reason  . doxycycline (VIBRAMYCIN) 100 MG capsule Completed Course  . HYDROcodone-acetaminophen (NORCO) 10-325 MG per tablet Reorder  . HYDROcodone-acetaminophen (NORCO) 10-325 MG per tablet Reorder    Follow-up: No Follow-up on file.   Sherlene Shams, MD

## 2015-05-20 NOTE — Progress Notes (Signed)
Pre-visit discussion using our clinic review tool. No additional management support is needed unless otherwise documented below in the visit note.  

## 2015-05-21 ENCOUNTER — Ambulatory Visit (INDEPENDENT_AMBULATORY_CARE_PROVIDER_SITE_OTHER): Payer: 59 | Admitting: Cardiovascular Disease

## 2015-05-21 ENCOUNTER — Encounter: Payer: Self-pay | Admitting: Cardiovascular Disease

## 2015-05-21 VITALS — BP 120/78 | HR 98 | Ht 74.0 in | Wt 353.5 lb

## 2015-05-21 DIAGNOSIS — I480 Paroxysmal atrial fibrillation: Secondary | ICD-10-CM

## 2015-05-21 DIAGNOSIS — I429 Cardiomyopathy, unspecified: Secondary | ICD-10-CM | POA: Insufficient documentation

## 2015-05-21 DIAGNOSIS — I519 Heart disease, unspecified: Secondary | ICD-10-CM | POA: Diagnosis not present

## 2015-05-21 DIAGNOSIS — I10 Essential (primary) hypertension: Secondary | ICD-10-CM

## 2015-05-21 DIAGNOSIS — I509 Heart failure, unspecified: Secondary | ICD-10-CM | POA: Insufficient documentation

## 2015-05-21 DIAGNOSIS — I5043 Acute on chronic combined systolic (congestive) and diastolic (congestive) heart failure: Secondary | ICD-10-CM | POA: Diagnosis not present

## 2015-05-21 DIAGNOSIS — R600 Localized edema: Secondary | ICD-10-CM | POA: Insufficient documentation

## 2015-05-21 MED ORDER — DABIGATRAN ETEXILATE MESYLATE 150 MG PO CAPS: 150.0000 mg | ORAL_CAPSULE | Freq: Two times a day (BID) | ORAL | Status: DC

## 2015-05-21 MED ORDER — AMIODARONE HCL 200 MG PO TABS: 200.0000 mg | ORAL_TABLET | Freq: Two times a day (BID) | ORAL | Status: DC

## 2015-05-21 MED ORDER — METOPROLOL SUCCINATE ER 100 MG PO TB24: 100.0000 mg | ORAL_TABLET | Freq: Two times a day (BID) | ORAL | Status: DC

## 2015-05-21 NOTE — Assessment & Plan Note (Signed)
We will hold the diltiazem given his significant leg swelling Increased metoprolol up to 100 mg twice a day

## 2015-05-21 NOTE — Progress Notes (Signed)
Patient ID: Johnathan [name], male    DOB: 08-Jul-1956, 59 y.o.   MRN: [id number]  HPI Comments: Johnathan [name] is a pleasant 59 year old gentleman with obesity, chronic joint pain of the shoulders and knees on long-term low-dose narcotics, hypertension, history of bariatric surgery, atrial fibrillation in August 2013, normal ejection fraction at that time who presents for follow-up of his atrial fibrillation  After a tick bite in June 2016, he developed atrial fibrillation initially with RVR. Started on digoxin, continued on metoprolol and diltiazem with improved heart rate. Echocardiogram and stress test ordered by our office. Cardiac exam shows slightly depressed ejection fraction, dilated left atrium, mild LVH. Ejection fraction estimated at 40-45%. Stress test with small region of apical perfusion defect, depressed ejection fraction.  Breathing improved on the digoxin and Lasix, now with worsening shortness of breath and leg swelling over the past several days. He is concerned about a reaction from the eliquis. Significant itching, red spots.  He finds it difficult to weigh himself. 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  No significant chest burning but did have some previously in the setting of atrial fibrillation with RVR. Not much recently, only shortness of breath. He is interested in restoring normal sinus rhythm. Has been compliant with his anticoagulation He is non-compliant on his CPAP.   EKG on today's visit shows atrial fibrillation with ventricular rate 98 bpm, PVCs noted  Other past medical history Previously reported having tachycardia when he feels hot, improved when he cools down Previously had significant shoulder pain Prior clinic visit with shortness of breath that improved with Lasix  In the hospital 05/31/2012 with left-sided chest pain and palpitations. He was given adenosine in the  emergency room that showed atrial fibrillation. He received Cardizem and started on diltiazem infusion and converted to normal sinus rhythm overnight. He was started on anticoagulation, continued on long-acting Cardizem and was discharged after echocardiogram showed normal LV function.  Echocardiogram showed normal LV function with ejection fraction 50-55%, normal right ventricular systolic pressure, normal left atrium TSH 1.7  Event monitor was performed September 16 2 07/25/2012 that showed normal sinus rhythm with PVCs, no significant atrial fibrillation Blood pressure at home has been labile, typically in the 130-140 systolic range.    No Known Allergies  Outpatient Encounter Prescriptions as of 05/21/2015  Medication Sig  . B Complex Vitamins (B COMPLEX 50 PO) Take by mouth daily.  Marland Kitchen buPROPion (WELLBUTRIN XL) 300 MG 24 hr tablet Take 1 tablet by mouth  daily  . Cholecalciferol (VITAMIN D3) 2000 UNITS TABS Take by mouth daily.  . digoxin (LANOXIN) 0.25 MG tablet Take 1 tablet (0.25 mg total) by mouth daily.  Marland Kitchen FERROUS GLUCONATE PO Take by mouth daily.  . furosemide (LASIX) 40 MG tablet Take 1 tablet (40 mg total) by mouth daily.  Marland Kitchen gabapentin (NEURONTIN) 100 MG capsule Take 1 capsule (100 mg total) by mouth 3 (three) times daily.  Marland Kitchen GLUCOSAMINE HCL-MSM PO Take by mouth daily.  Marland Kitchen HYDROcodone-acetaminophen (NORCO) 10-325 MG per tablet Take 1 tablet by mouth every 6 (six) hours as needed.  Marland Kitchen losartan-hydrochlorothiazide (HYZAAR) 50-12.5 MG per tablet Take 1 tablet by mouth  daily  . meloxicam (MOBIC) 15 MG tablet Take 1 tablet by mouth  every day  . metoprolol succinate (TOPROL-XL) 100 MG 24 hr tablet Take 1 tablet (100 mg total) by mouth 2 (two) times daily. Take with or immediately  following a meal.  . Multiple Vitamin (MULTIVITAMIN) tablet Take 1 tablet by mouth daily.  . nitroGLYCERIN (NITROSTAT) 0.4 MG SL tablet Place 1 tablet (0.4 mg total) under the tongue every 5 (five) minutes as  needed for chest pain.  . potassium chloride 20 MEQ TBCR Take 20 mEq by mouth daily.  Marland Kitchen triamcinolone cream (KENALOG) 0.1 % Apply 1 application topically 2 (two) times daily.  . vitamin B-12 (CYANOCOBALAMIN) 500 MCG tablet Take 500 mcg by mouth daily.  . vitamin E 400 UNIT capsule Take 400 Units by mouth daily.  . [DISCONTINUED] apixaban (ELIQUIS) 5 MG TABS tablet Take 1 tablet (5 mg total) by mouth 2 (two) times daily.  . [DISCONTINUED] diltiazem (DILACOR XR) 240 MG 24 hr capsule Take 1 capsule by mouth  daily  . [DISCONTINUED] metoprolol succinate (TOPROL-XL) 50 MG 24 hr tablet Take 1 tablet (50 mg total) by mouth 2 (two) times daily. Take with or immediately following a meal.  . amiodarone (PACERONE) 200 MG tablet Take 1 tablet (200 mg total) by mouth 2 (two) times daily.  . cephALEXin (KEFLEX) 500 MG capsule Take 1 capsule (500 mg total) by mouth 4 (four) times daily. (Patient not taking: Reported on 05/21/2015)  . dabigatran (PRADAXA) 150 MG CAPS capsule Take 1 capsule (150 mg total) by mouth 2 (two) times daily.  . [DISCONTINUED] HYDROcodone-acetaminophen (NORCO) 10-325 MG per tablet Take 1 tablet by mouth every 6 (six) hours as needed. (Patient not taking: Reported on 05/21/2015)   No facility-administered encounter medications on file as of 05/21/2015.    Past Medical History  Diagnosis Date  . Hypertension   . B12 deficiency   . Arthritis     a. chronic joint pain  . Obesity   . History of DVT (deep vein thrombosis) 2004    s/p bariatric surgery  . PAF (paroxysmal atrial fibrillation)     a. CHADSVASc at least 2 (HTN and vascular disease); b. on Eliquis   . OSA (obstructive sleep apnea)     a. not compliant with CPAP  . Chronic systolic CHF (congestive heart failure)     a. echo 2013: EF of 50-55%, normal right ventricular systolic pressure, normal left atrium; b. EF 40-45%, inadequate for LV wall motion, mild MR, LA severely dilated @ 52 mm, PASP nl  . History of stress test      a. there was no ST segment deviation noted during stress,    There is a small defect of moderate severity present in the apex location, suggestive of apical ischemia, intermediate risk, calculated EF 21% but visually appears to be 35-40%    Past Surgical History  Procedure Laterality Date  . Bariatric surgery  2004    [place] Y   . Arthroscopic repair acl  1993    bilateral knee  . Cholecystectomy  1982  . Meniscus debridement  2011    Left knee  . Replacement total knee Left   . Bariatric surgery      Social History  reports that he has never smoked. He has never used smokeless tobacco. He reports that he drinks about 1.2 oz of alcohol per week. He reports that he does not use illicit drugs.  Family History Family history is unknown by patient.  Review of Systems  Constitutional: Negative.   Respiratory: Positive for shortness of breath.   Cardiovascular: Positive for leg swelling.  Gastrointestinal: Negative.   Musculoskeletal: Positive for joint swelling, arthralgias and gait problem.  Skin: Negative.  Neurological: Negative.   Hematological: Negative.   Psychiatric/Behavioral: Negative.   All other systems reviewed and are negative.   BP 120/78 mmHg  Pulse 98  Ht 6\' 2"  (1.88 m)  Wt 353 lb 8 oz (160.347 kg)  BMI 45.37 kg/m2  Physical Exam  Constitutional: He is oriented to person, place, and time. He appears well-developed and well-nourished.  obese  HENT:  Head: Normocephalic.  Nose: Nose normal.  Mouth/Throat: Oropharynx is clear and moist.  Eyes: Conjunctivae are normal. Pupils are equal, round, and reactive to light.  Neck: Normal range of motion. Neck supple. No JVD present.  Cardiovascular: Normal rate, regular rhythm, S1 normal, S2 normal, normal heart sounds and intact distal pulses.  Exam reveals no gallop and no friction rub.   No murmur heard. 2+ pitting edema  B/l edema  Pulmonary/Chest: Effort normal and breath sounds normal. No respiratory  distress. He has no wheezes. He has no rales. He exhibits no tenderness.  Abdominal: Soft. Bowel sounds are normal. He exhibits no distension. There is no tenderness.  Musculoskeletal: Normal range of motion. He exhibits no edema or tenderness.  Lymphadenopathy:    He has no cervical adenopathy.  Neurological: He is alert and oriented to person, place, and time. Coordination normal.  Skin: Skin is warm and dry. No rash noted. No erythema.  Psychiatric: He has a normal mood and affect. His behavior is normal. Judgment and thought content normal.      Assessment and Plan   Nursing note and vitals reviewed.

## 2015-05-21 NOTE — Assessment & Plan Note (Signed)
Reviewed ECHO with patient and outlined the  Possible causes of his decreased EF, underscoring the need for a diagnostic catheterization

## 2015-05-21 NOTE — Assessment & Plan Note (Signed)
Leg edema likely multifactorial Exacerbation from Channel Blocker/Diltiazem, acute on chronic combined systolic and diastolic CHF, unable to exclude a reaction from the eliquis.

## 2015-05-21 NOTE — Assessment & Plan Note (Signed)
Depressed ejection fraction likely secondary to atrial fibrillation We will work on restoring normal sinus rhythm

## 2015-05-21 NOTE — Assessment & Plan Note (Signed)
Recommended Lasix 40 mg daily at least,  He does not feel that he can handle twice a day dosing given his work schedule Recommended he decrease his fluid intake

## 2015-05-21 NOTE — Assessment & Plan Note (Signed)
diagnosed with prior sleep study but treatment has been deferred d by patient.  Discussed the long term history of OSA , the risks of long term damage to heart and the signs and symptoms attributable to OSA.  Advised patient to consider  significant weight loss and/or use of CPAP.  

## 2015-05-21 NOTE — Patient Instructions (Addendum)
Please stop the eliquis  Start pradaxa 150 mg twice a day  Please hold the diltiazem Increase the metoprolol up to 100 mg twice a day  Continue for now the lasix 40 mg daily  Start amiodarone 400 mg twice a day for 5 days Then down to 200 mg twice a day EKG in 2 weeks If still in atrial fibrillation, we could set up a cardioversion  Please call us if you have new issues that need to be addressed before your next appt.  Your physician wants you to follow-up in: 1 month.

## 2015-05-21 NOTE — Assessment & Plan Note (Addendum)
Currently with persistent atrial fibrillation Long discussion today concerning how to restore normal sinus rhythm We will start him on amiodarone 400 mg by mouth twice a day for 5 days then don't 200 mg twice a day EKG in 2 weeks time Stay on metoprolol 100 mg twice a day All the diltiazem given his severe leg swelling and skin breakdown Stay on Lasix 40 mg daily with potassium (he has only been taking 20 mg) He would like to change the eliquis  we will start pradaxa 150 mg by mouth twice a day.

## 2015-05-21 NOTE — Assessment & Plan Note (Signed)
multiple joints , OA aggravated by morbid obesity .  Patient is using vicodin every 6 hours. Patient has not had an escalation of use of narcotics and is reminded not to share medication with others or to combine with alcohol or other sedating medications.  Patient is providing urine sample upon request per narcotics contract, Refill on narcotic was given today.    

## 2015-05-21 NOTE — Assessment & Plan Note (Signed)
Multifactorial, with systolic dysfunction and VI. unable to wear compression stockings.  Continue daily lasix,

## 2015-05-26 ENCOUNTER — Other Ambulatory Visit: Payer: Self-pay | Admitting: Internal Medicine

## 2015-06-04 ENCOUNTER — Ambulatory Visit (INDEPENDENT_AMBULATORY_CARE_PROVIDER_SITE_OTHER): Payer: 59

## 2015-06-04 ENCOUNTER — Telehealth: Payer: Self-pay

## 2015-06-04 DIAGNOSIS — I4891 Unspecified atrial fibrillation: Secondary | ICD-10-CM | POA: Diagnosis not present

## 2015-06-04 NOTE — Patient Instructions (Signed)
1.) Reason for visit: EKG  2.) Name of MD requesting visit: Gollan  3.) H&P: History of afib, metoprolol increased; amiodarone increased for 5 days at 8/10 OV.   4.) ROS related to problem: Afib, HTN. Pt takes metoprolol, amiodarone and pradaxa. States he was unable to tolerate eliquis as it made him break out in a rash  5.) Assessment and plan per MD: EKG performed, given to Dr. Mariah Milling to review.  At time of nurse visit, Epic was down. Unable to review allergies, meds, etc.

## 2015-06-04 NOTE — Telephone Encounter (Signed)
Forward nurse visit to Dr. Mariah Milling

## 2015-06-04 NOTE — Telephone Encounter (Signed)
EKG shows atrial fibrillation, rate well controlled I hope his leg swelling is improving with prior medication changes My notes indicate he was taking eliquis 5 mg twice a day consistently prior to our last office visit and then changed to pradaxa 150 mg twice a day (no days missed?) If that is the case, we could set up cardioversion. Typically done first thing in the morning

## 2015-06-06 MED ORDER — DABIGATRAN ETEXILATE MESYLATE 150 MG PO CAPS: 150.0000 mg | ORAL_CAPSULE | Freq: Two times a day (BID) | ORAL | Status: DC

## 2015-06-06 NOTE — Telephone Encounter (Signed)
Left message for pt to call back  °

## 2015-06-06 NOTE — Telephone Encounter (Signed)
Spoke w/ pt's wife.  Advised her of Dr. Windell Hummingbird recommendation.  She reports that pt's leg swelling has improved and he is feeling better.  She reports that pt has been taking his Pradaxa as prescribed and requests [facility].  She states that pt has a difficult time getting off of work, so she will speak w/ him and call back to let us know if he is agreeable to scheduling cardioversion.

## 2015-06-06 NOTE — Addendum Note (Signed)
Addended by: Rhea Belton R on: 06/06/2015 05:09 PM   Modules accepted: Orders

## 2015-06-10 ENCOUNTER — Other Ambulatory Visit: Payer: Self-pay | Admitting: Family Medicine

## 2015-06-10 ENCOUNTER — Other Ambulatory Visit: Payer: Self-pay

## 2015-06-10 DIAGNOSIS — Z01812 Encounter for preprocedural laboratory examination: Secondary | ICD-10-CM

## 2015-06-10 DIAGNOSIS — I4891 Unspecified atrial fibrillation: Secondary | ICD-10-CM

## 2015-06-10 NOTE — Telephone Encounter (Signed)
Pt last seen 07/2014. Refill denied.

## 2015-06-10 NOTE — Telephone Encounter (Signed)
Spoke w/ Olegario Messier, pt's wife.  She states that pt is agreeable to cardioversion whatever morning is good w/ Dr. Mariah Milling.  She requests that I put orders in so that he can go to [facility] this afternoon to have labs drawn. What morning in the next 2 weeks works for you?

## 2015-06-12 ENCOUNTER — Encounter: Payer: Self-pay | Admitting: Cardiovascular Disease

## 2015-06-12 ENCOUNTER — Telehealth: Payer: Self-pay

## 2015-06-12 NOTE — Telephone Encounter (Signed)
Left message on Johnathan [name]'s vm that pt is sched for DCCV on Fri 06/20/15, to arrive at [facility] at 6:30 am.  Left pre-procedure instructions and advised her that I am mailing them, as well as info on the procedure to pt's home. Asked her to call back w/ any questions or concerns.

## 2015-06-19 ENCOUNTER — Other Ambulatory Visit: Payer: Self-pay | Admitting: Cardiovascular Disease

## 2015-06-19 ENCOUNTER — Other Ambulatory Visit: Payer: Self-pay | Admitting: Family Medicine

## 2015-06-20 ENCOUNTER — Ambulatory Visit
Admission: RE | Admit: 2015-06-20 | Discharge: 2015-06-20 | Disposition: A | Discharge status: Home / Self Care | Payer: 59 | Source: Ambulatory Visit | Attending: Cardiovascular Disease | Admitting: Cardiovascular Disease

## 2015-06-20 ENCOUNTER — Encounter: Payer: Self-pay | Admitting: *Deleted

## 2015-06-20 ENCOUNTER — Ambulatory Visit: Payer: 59 | Admitting: Anesthesiology

## 2015-06-20 ENCOUNTER — Encounter
Admission: RE | Disposition: A | Discharge status: Home / Self Care | Payer: Self-pay | Source: Ambulatory Visit | Attending: Cardiovascular Disease

## 2015-06-20 DIAGNOSIS — G4733 Obstructive sleep apnea (adult) (pediatric): Secondary | ICD-10-CM | POA: Diagnosis not present

## 2015-06-20 DIAGNOSIS — I10 Essential (primary) hypertension: Secondary | ICD-10-CM | POA: Insufficient documentation

## 2015-06-20 DIAGNOSIS — Z79899 Other long term (current) drug therapy: Secondary | ICD-10-CM | POA: Diagnosis not present

## 2015-06-20 DIAGNOSIS — I4819 Other persistent atrial fibrillation: Secondary | ICD-10-CM | POA: Insufficient documentation

## 2015-06-20 DIAGNOSIS — I2510 Atherosclerotic heart disease of native coronary artery without angina pectoris: Secondary | ICD-10-CM | POA: Diagnosis not present

## 2015-06-20 DIAGNOSIS — Z79891 Long term (current) use of opiate analgesic: Secondary | ICD-10-CM | POA: Diagnosis not present

## 2015-06-20 DIAGNOSIS — I5022 Chronic systolic (congestive) heart failure: Secondary | ICD-10-CM | POA: Insufficient documentation

## 2015-06-20 DIAGNOSIS — I481 Persistent atrial fibrillation: Secondary | ICD-10-CM | POA: Insufficient documentation

## 2015-06-20 DIAGNOSIS — Z6841 Body Mass Index (BMI) 40.0 and over, adult: Secondary | ICD-10-CM | POA: Diagnosis not present

## 2015-06-20 HISTORY — PX: ELECTROPHYSIOLOGIC STUDY: SHX172A

## 2015-06-20 SURGERY — CARDIOVERSION (CATH LAB)
Anesthesia: General

## 2015-06-20 MED ORDER — PROPOFOL 10 MG/ML IV BOLUS
INTRAVENOUS | Status: DC | PRN
  Administered 2015-06-20 (×2): 50 mg via INTRAVENOUS

## 2015-06-20 MED ORDER — SODIUM CHLORIDE 0.9 % IV SOLN
INTRAVENOUS | Status: DC
  Administered 2015-06-20: 07:00:00 via INTRAVENOUS

## 2015-06-20 NOTE — Anesthesia Preprocedure Evaluation (Addendum)
Anesthesia Evaluation  Patient identified by MRN, date of birth, ID band Patient awake    Reviewed: Allergy & Precautions, H&P , NPO status , Patient's Chart, lab work & pertinent test results  Airway Mallampati: III  TM Distance: >3 FB Neck ROM: full    Dental  (+) Poor Dentition, Chipped, Caps   Pulmonary shortness of breath, sleep apnea ,    Pulmonary exam normal breath sounds clear to auscultation       Cardiovascular Exercise Tolerance: Poor hypertension, + CAD, +CHF, + Orthopnea and + PND  + dysrhythmias Atrial Fibrillation  Rhythm:irregular Rate:Normal     Neuro/Psych negative neurological ROS  negative psych ROS   GI/Hepatic negative GI ROS, Neg liver ROS,   Endo/Other  negative endocrine ROS  Renal/GU negative Renal ROS  negative genitourinary   Musculoskeletal  (+) Arthritis ,   Abdominal   Peds  Hematology negative hematology ROS (+)   Anesthesia Other Findings Past Medical History:   Hypertension                                                 B12 deficiency                                               Arthritis                                                      Comment:a. chronic joint pain   Obesity                                                      History of DVT (deep vein thrombosis)           2004           Comment:s/p bariatric surgery   PAF (paroxysmal atrial fibrillation)                           Comment:a. CHADSVASc at least 2 (HTN and vascular               disease); b. on Eliquis    OSA (obstructive sleep apnea)                                  Comment:a. not compliant with CPAP   Chronic systolic CHF (congestive heart failure)                Comment:a. echo 2013: EF of 50-55%, normal right               ventricular systolic pressure, normal left               atrium; b. EF 40-45%, inadequate for LV wall               motion, mild  MR, LA severely dilated @ 52 mm,   PASP nl   History of stress test                                         Comment:a. there was no ST segment deviation noted               during stress,    There is a small defect of               moderate severity present in the apex location,              suggestive of apical ischemia, intermediate               risk, calculated EF 21% but visually appears to              be 35-40%   Morbid obesity BMI 42  Reproductive/Obstetrics negative OB ROS                          Anesthesia Physical Anesthesia Plan  ASA: III  Anesthesia Plan: General   Post-op Pain Management:    Induction:   Airway Management Planned:   Additional Equipment:   Intra-op Plan:   Post-operative Plan:   Informed Consent: I have reviewed the patients History and Physical, chart, labs and discussed the procedure including the risks, benefits and alternatives for the proposed anesthesia with the patient or authorized representative who has indicated his/her understanding and acceptance.   Dental Advisory Given  Plan Discussed with: Anesthesiologist, CRNA and Surgeon  Anesthesia Plan Comments:        Anesthesia Quick Evaluation

## 2015-06-20 NOTE — Transfer of Care (Signed)
Immediate Anesthesia Transfer of Care Note  Patient: Johnathan [name]  Procedure(s) Performed: Procedure(s): CARDIOVERSION (N/A)  Patient Location: PACU  Anesthesia Type:General  Level of Consciousness: awake  Airway & Oxygen Therapy: Patient Spontanous Breathing and Patient connected to nasal cannula oxygen  Post-op Assessment: Report given to RN and Post -op Vital signs reviewed and stable  Post vital signs: Reviewed and stable  Last Vitals:  Filed Vitals:   06/20/15 0748  BP: 124/82  Pulse: 72  Temp: 36.1 C  Resp: 21    Complications: No apparent anesthesia complications

## 2015-06-20 NOTE — CV Procedure (Signed)
Cardioversion procedure note For atrial fibrillation, persistent.  Procedure Details:  Consent: Risks of procedure as well as the alternatives and risks of each were explained to the (patient/caregiver). Consent for procedure obtained.  Time Out: Verified patient identification, verified procedure, site/side was marked, verified correct patient position, special equipment/implants available, medications/allergies/relevent history reviewed, required imaging and test results available. Performed  Patient placed on cardiac monitor, pulse oximetry, supplemental oxygen as necessary.  Sedation given: propofol IV, Dr. Randa Ngo Pacer pads placed anterior and posterior chest.   Cardioverted 1 time(s).  Cardioverted at  200 J. Synchronized biphasic Converted to NSR   Evaluation: Findings: Post procedure EKG shows: NSR Complications: None Patient did tolerate procedure well.  Time Spent Directly with the Patient:  45 minutes   Johnathan [name], M.D., Ph.D.

## 2015-06-20 NOTE — Anesthesia Postprocedure Evaluation (Signed)
  Anesthesia Post-op Note  Patient: Johnathan [name]  Procedure(s) Performed: Procedure(s): CARDIOVERSION (N/A)  Anesthesia type:General  Patient location: PACU  Post pain: Pain level controlled  Post assessment: Post-op Vital signs reviewed, Patient's Cardiovascular Status Stable, Respiratory Function Stable, Patent Airway and No signs of Nausea or vomiting  Post vital signs: Reviewed and stable  Last Vitals:  Filed Vitals:   06/20/15 0834  BP: 122/93  Pulse: 60  Temp:   Resp: 17    Level of consciousness: awake, alert  and patient cooperative  Complications: No apparent anesthesia complications

## 2015-06-20 NOTE — Discharge Instructions (Signed)

## 2015-06-30 ENCOUNTER — Other Ambulatory Visit: Payer: Self-pay | Admitting: Family Medicine

## 2015-06-30 ENCOUNTER — Telehealth: Payer: Self-pay | Admitting: Family Medicine

## 2015-06-30 MED ORDER — GABAPENTIN 100 MG PO CAPS: 100.0000 mg | ORAL_CAPSULE | Freq: Three times a day (TID) | ORAL | Status: DC

## 2015-06-30 NOTE — Telephone Encounter (Signed)
Refill done.  Pt scheduled appt 10.5.16.

## 2015-06-30 NOTE — Telephone Encounter (Signed)
rx sent into pharmacy, pt's wife made aware.

## 2015-06-30 NOTE — Telephone Encounter (Signed)
pts refill request for gabapentin (NEURONTIN) 100 MG capsule [[id number]]  Has been denied so I made an appointment for him on 10/5. He only has 3 pills left and was hoping a prescription can be sent in till he can come in. [facility] [place] Can you please call wife Natalia Leatherwood at [phone]. Please do not call between 11:30-12. This was denied a couple weeks ago and no one called her about it.

## 2015-07-01 ENCOUNTER — Encounter: Payer: 59 | Admitting: Physician Assistant

## 2015-07-01 ENCOUNTER — Ambulatory Visit (INDEPENDENT_AMBULATORY_CARE_PROVIDER_SITE_OTHER): Payer: 59 | Admitting: Cardiovascular Disease

## 2015-07-01 ENCOUNTER — Encounter: Payer: Self-pay | Admitting: Cardiovascular Disease

## 2015-07-01 VITALS — BP 128/74 | HR 45 | Ht 74.0 in | Wt 347.0 lb

## 2015-07-01 DIAGNOSIS — I4819 Other persistent atrial fibrillation: Secondary | ICD-10-CM

## 2015-07-01 DIAGNOSIS — I10 Essential (primary) hypertension: Secondary | ICD-10-CM | POA: Diagnosis not present

## 2015-07-01 DIAGNOSIS — I480 Paroxysmal atrial fibrillation: Secondary | ICD-10-CM | POA: Diagnosis not present

## 2015-07-01 DIAGNOSIS — I481 Persistent atrial fibrillation: Secondary | ICD-10-CM | POA: Diagnosis not present

## 2015-07-01 DIAGNOSIS — I5033 Acute on chronic diastolic (congestive) heart failure: Secondary | ICD-10-CM

## 2015-07-01 DIAGNOSIS — R5382 Chronic fatigue, unspecified: Secondary | ICD-10-CM

## 2015-07-01 MED ORDER — POTASSIUM CHLORIDE ER 20 MEQ PO TBCR: 20.0000 meq | EXTENDED_RELEASE_TABLET | Freq: Two times a day (BID) | ORAL | Status: DC | PRN

## 2015-07-01 MED ORDER — POTASSIUM CHLORIDE ER 20 MEQ PO TBCR: 20.0000 meq | EXTENDED_RELEASE_TABLET | Freq: Every day | ORAL | Status: DC

## 2015-07-01 MED ORDER — FUROSEMIDE 40 MG PO TABS: 40.0000 mg | ORAL_TABLET | Freq: Two times a day (BID) | ORAL | Status: DC | PRN

## 2015-07-01 NOTE — Assessment & Plan Note (Signed)
Recommended he stay on his Lasix 40 mg daily, extra Lasix for worsening leg edema. Limited by his work schedule and he takes Lasix when he gets home at 4 PM. May need to take 80 mg Lasix for significant leg edema, take twice a day on the weekends with extra potassium He has significant fluid intake daily. Recommended he decrease his fluid intake

## 2015-07-01 NOTE — Assessment & Plan Note (Signed)
We have encouraged continued exercise, careful diet management in an effort to lose weight. 

## 2015-07-01 NOTE — Assessment & Plan Note (Signed)
Blood pressure is well controlled on today's visit. No changes made to the medications. 

## 2015-07-01 NOTE — Patient Instructions (Addendum)
You are doing well.  Take lasix up to 2 a day for leg edema With 2 potassium  Cut the morning metoprolol in 1/2 Stay on the full at night  Hold the digoxin  Please call us if you have new issues that need to be addressed before your next appt.  Your physician wants you to follow-up in: 3 months.  You will receive a reminder letter in the mail two months in advance. If you don't receive a letter, please call our office to schedule the follow-up appointment.

## 2015-07-01 NOTE — Assessment & Plan Note (Signed)
Fatigue likely multifactorial Long workday, poor sleep with sleep apnea, currently not on CPAP, Now with bradycardia, exacerbated by metoprolol We'll decrease his metoprolol, hold the digoxin

## 2015-07-01 NOTE — Progress Notes (Signed)
Patient ID: Johnathan [name], male    DOB: 08-30-1956, 59 y.o.   MRN: [id number]  HPI Comments: Johnathan [name] is a pleasant 59 year old gentleman with obesity, chronic joint pain of the shoulders and knees on long-term low-dose narcotics, hypertension, history of bariatric surgery, atrial fibrillation in August 2013, normal ejection fraction at that time who presents for follow-up of his atrial fibrillation. History of sleep apnea, currently not on his CPAP  He underwent cardioversion several weeks ago which was successful in restoring normal sinus rhythm Today he presents for routine follow-up of his atrial fibrillation. He reports having fatigue at home though otherwise feels well Occasionally misses Lasix. Typically takes this at 4 PM after he gets off work. Significant leg edema on a daily basis. He attributes this to standing on cement all day. Some shortness of breath with exertion.  EKG on today's visit shows normal sinus rhythm with rate 45 bpm, nonspecific ST abnormality, PVCs noted  Other past medical history  tick bite in June 2016, he developed atrial fibrillation initially with RVR. Started on digoxin, continued on metoprolol and diltiazem with improved heart rate. Echocardiogram and stress test at that time Cardiac exam shows slightly depressed ejection fraction, dilated left atrium, mild LVH. Ejection fraction estimated at 40-45%. Stress test with small region of apical perfusion defect, depressed ejection fraction.  Other past medical history Previously reported having tachycardia when he feels hot, improved when he cools down Previously had significant shoulder pain Prior clinic visit with shortness of breath that improved with Lasix  In the hospital 05/31/2012 with left-sided chest pain and palpitations. He was given adenosine in the emergency room that showed atrial fibrillation. He received Cardizem and started on diltiazem infusion and converted to normal sinus rhythm  overnight. He was started on anticoagulation, continued on long-acting Cardizem and was discharged after echocardiogram showed normal LV function.  Echocardiogram showed normal LV function with ejection fraction 50-55%, normal right ventricular systolic pressure, normal left atrium TSH 1.7  Event monitor was performed September 16 2 07/25/2012 that showed normal sinus rhythm with PVCs, no significant atrial fibrillation Blood pressure at home has been labile, typically in the 130-140 systolic range.    No Known Allergies  Outpatient Encounter Prescriptions as of 07/01/2015  Medication Sig  . amiodarone (PACERONE) 200 MG tablet Take 1 tablet (200 mg total) by mouth 2 (two) times daily.  . B Complex Vitamins (B COMPLEX 50 PO) Take by mouth daily.  Marland Kitchen buPROPion (WELLBUTRIN XL) 300 MG 24 hr tablet Take 1 tablet by mouth  daily  . Cholecalciferol (VITAMIN D3) 2000 UNITS TABS Take by mouth daily.  . dabigatran (PRADAXA) 150 MG CAPS capsule Take 1 capsule (150 mg total) by mouth 2 (two) times daily.  Marland Kitchen FERROUS GLUCONATE PO Take by mouth daily.  . furosemide (LASIX) 40 MG tablet Take 1 tablet (40 mg total) by mouth 2 (two) times daily as needed.  . gabapentin (NEURONTIN) 100 MG capsule Take 1 capsule (100 mg total) by mouth 3 (three) times daily.  Marland Kitchen GLUCOSAMINE HCL-MSM PO Take by mouth daily.  Marland Kitchen HYDROcodone-acetaminophen (NORCO) 10-325 MG per tablet Take 1 tablet by mouth every 6 (six) hours as needed.  Marland Kitchen losartan-hydrochlorothiazide (HYZAAR) 50-12.5 MG per tablet Take 1 tablet by mouth  daily  . meloxicam (MOBIC) 15 MG tablet Take 1 tablet by mouth  every day  . metoprolol succinate (TOPROL-XL) 100 MG 24 hr tablet Take 1 tablet (100 mg total) by mouth 2 (two) times daily.  Take with or immediately following a meal.  . Multiple Vitamin (MULTIVITAMIN) tablet Take 1 tablet by mouth daily.  . nitroGLYCERIN (NITROSTAT) 0.4 MG SL tablet Place 1 tablet (0.4 mg total) under the tongue every 5 (five) minutes  as needed for chest pain.  Marland Kitchen Potassium Chloride ER 20 MEQ TBCR Take 20 mEq by mouth 2 (two) times daily as needed.  . triamcinolone cream (KENALOG) 0.1 % Apply 1 application topically 2 (two) times daily.  . vitamin B-12 (CYANOCOBALAMIN) 500 MCG tablet Take 500 mcg by mouth daily.  . vitamin E 400 UNIT capsule Take 400 Units by mouth daily.  . [DISCONTINUED] digoxin (LANOXIN) 0.25 MG tablet Take 1 tablet (0.25 mg total) by mouth daily.  . [DISCONTINUED] furosemide (LASIX) 40 MG tablet Take 1 tablet (40 mg total) by mouth daily.  . [DISCONTINUED] potassium chloride 20 MEQ TBCR Take 20 mEq by mouth daily.  . [DISCONTINUED] Potassium Chloride ER 20 MEQ TBCR Take 20 mEq by mouth daily.  . [DISCONTINUED] cephALEXin (KEFLEX) 500 MG capsule Take 1 capsule (500 mg total) by mouth 4 (four) times daily. (Patient not taking: Reported on 05/21/2015)  . [DISCONTINUED] gabapentin (NEURONTIN) 100 MG capsule Take 1 capsule by mouth 3  times daily (Patient not taking: Reported on 07/01/2015)   No facility-administered encounter medications on file as of 07/01/2015.    Past Medical History  Diagnosis Date  . Hypertension   . B12 deficiency   . Arthritis     a. chronic joint pain  . Obesity   . History of DVT (deep vein thrombosis) 2004    s/p bariatric surgery  . PAF (paroxysmal atrial fibrillation)     a. CHADSVASc at least 2 (HTN and vascular disease); b. on Eliquis   . OSA (obstructive sleep apnea)     a. not compliant with CPAP  . Chronic systolic CHF (congestive heart failure)     a. echo 2013: EF of 50-55%, normal right ventricular systolic pressure, normal left atrium; b. EF 40-45%, inadequate for LV wall motion, mild MR, LA severely dilated @ 52 mm, PASP nl  . History of stress test     a. there was no ST segment deviation noted during stress,    There is a small defect of moderate severity present in the apex location, suggestive of apical ischemia, intermediate risk, calculated EF 21% but  visually appears to be 35-40%    Past Surgical History  Procedure Laterality Date  . Bariatric surgery  2004    [place]   . Arthroscopic repair acl  1993    bilateral knee  . Cholecystectomy  1982  . Meniscus debridement  2011    Left knee  . Replacement total knee Left   . Bariatric surgery    . Electrophysiologic study N/A 06/20/2015    Procedure: CARDIOVERSION;  Surgeon: Antonieta Iba, MD;  Location: [facility] ORS;  Service: Cardiovascular;  Laterality: N/A;    Social History  reports that he has never smoked. He has never used smokeless tobacco. He reports that he drinks about 1.2 oz of alcohol per week. He reports that he does not use illicit drugs.  Family History Family history is unknown by patient.  Review of Systems  Constitutional: Negative.   Respiratory: Positive for shortness of breath.   Cardiovascular: Positive for leg swelling.  Gastrointestinal: Negative.   Musculoskeletal: Positive for joint swelling, arthralgias and gait problem.  Skin: Negative.   Neurological: Negative.   Hematological: Negative.  Psychiatric/Behavioral: Negative.   All other systems reviewed and are negative.   BP 128/74 mmHg  Pulse 45  Ht  (1.88 m)  Wt 347 lb (157.398 kg)  BMI 44.53 kg/m2  Physical Exam  Constitutional: He is oriented to person, place, and time. He appears well-developed and well-nourished.  obese  HENT:  Head: Normocephalic.  Nose: Nose normal.  Mouth/Throat: Oropharynx is clear and moist.  Eyes: Conjunctivae are normal. Pupils are equal, round, and reactive to light.  Neck: Normal range of motion. Neck supple. No JVD present.  Cardiovascular: Normal rate, regular rhythm, S1 normal, S2 normal, normal heart sounds and intact distal pulses.  Exam reveals no gallop and no friction rub.   No murmur heard. Trace pitting edema  B/l edema  Pulmonary/Chest: Effort normal and breath sounds normal. No respiratory distress. He has no wheezes. He has no rales. He  exhibits no tenderness.  Abdominal: Soft. Bowel sounds are normal. He exhibits no distension. There is no tenderness.  Musculoskeletal: Normal range of motion. He exhibits no edema or tenderness.  Lymphadenopathy:    He has no cervical adenopathy.  Neurological: He is alert and oriented to person, place, and time. Coordination normal.  Skin: Skin is warm and dry. No rash noted. No erythema.  Psychiatric: He has a normal mood and affect. His behavior is normal. Judgment and thought content normal.      Assessment and Plan   Nursing note and vitals reviewed.

## 2015-07-01 NOTE — Assessment & Plan Note (Signed)
Maintaining normal sinus rhythm Bradycardia on today's visit with PVCs. He does have significant fatigue, heart rate in the 40s. We will hold the digoxin, decrease the metoprolol down to 50 mg in the morning, continue 100 mg in the evening At a later date will decrease p.m. metoprolol, amiodarone

## 2015-07-02 ENCOUNTER — Other Ambulatory Visit: Payer: Self-pay | Admitting: Internal Medicine

## 2015-07-15 ENCOUNTER — Ambulatory Visit: Payer: 59 | Admitting: Cardiovascular Disease

## 2015-07-16 ENCOUNTER — Other Ambulatory Visit: Payer: Self-pay | Admitting: *Deleted

## 2015-07-16 ENCOUNTER — Encounter: Payer: Self-pay | Admitting: Family Medicine

## 2015-07-16 ENCOUNTER — Ambulatory Visit (INDEPENDENT_AMBULATORY_CARE_PROVIDER_SITE_OTHER): Payer: 59 | Admitting: Family Medicine

## 2015-07-16 VITALS — BP 130/84 | HR 67 | Ht 74.0 in | Wt 339.0 lb

## 2015-07-16 DIAGNOSIS — M159 Polyosteoarthritis, unspecified: Secondary | ICD-10-CM | POA: Diagnosis not present

## 2015-07-16 MED ORDER — GABAPENTIN 100 MG PO CAPS: 100.0000 mg | ORAL_CAPSULE | Freq: Four times a day (QID) | ORAL | Status: DC

## 2015-07-16 MED ORDER — AMIODARONE HCL 200 MG PO TABS: 200.0000 mg | ORAL_TABLET | Freq: Two times a day (BID) | ORAL | Status: DC

## 2015-07-16 NOTE — Progress Notes (Signed)
  [name] [address] [place], [place] [address] Phone: [phone] Subjective:     CC: Bilateral shoulder pain follow up [name] Johnathan [name] is a 59 y.o. male coming in with complaint of bilateral shoulder pain left greater than right. There is concern patient was having more of a cervical radiculopathy. X-rays were ordered by me and showed patient had a diffuse severe degenerative changes of the cervical spine. Patient also has moderate degenerative changes of the shoulders bilaterally. Patient states that the low dose of the gabapentin at this time 3-4 times a day has been very helpful. Denies any worsening symptoms. Patient has changed his job and the last months and has noticed some improvement with repetitive activity but less heavy lifting. Patient denies anything that is stopping him from his daily activities. Overall feels like he has had a good equilibrium.    Past medical history, social, surgical and family history all reviewed in electronic medical record.   Review of Systems: No headache, visual changes, nausea, vomiting, diarrhea, constipation, dizziness, abdominal pain, skin rash, fevers, chills, night sweats, weight loss, swollen lymph nodes, body aches, joint swelling, muscle aches, chest pain, shortness of breath, mood changes.   Objective Blood pressure 130/84, pulse 67, height  (1.88 m), weight 339 lb (153.769 kg), SpO2 92 %.  General: No apparent distress alert and oriented x3 mood and affect normal, dressed appropriately. Obese HEENT: Pupils equal, extraocular movements intact  Respiratory: Patient's speak in full sentences and does not appear short of breath  Cardiovascular: No lower extremity edema, non tender, no erythema  Skin: Warm dry intact with no signs of infection or rash on extremities or on axial skeleton.  Abdomen: Soft nontender  Neuro: Cranial nerves II through XII are intact, neurovascularly intact in all  extremities with 2+ DTRs and 2+ pulses.  Lymph: No lymphadenopathy of posterior or anterior cervical chain or axillae bilaterally.  Gait normal with good balance and coordination.  MSK:  Non tender with full range of motion and good stability and symmetric strength and tone of  elbows, wrist, hip, knee and ankles bilaterally.  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 Positive Spurling's maneuver still present with mild radicular symptoms down the right arm. Continued mild lack of last 5 of extension Grip strength and sensation normal in bilateral hands Strength good C4 to T1 distribution No sensory change to C4 to T1 Negative Hoffman sign bilaterally Reflexes normal Shoulder: Bilateral Mild atrophy of the shoulder girdle muscles bilaterally bicipital groove. ROM is full in all planes but severe crepitus left greater than right Rotator cuff strength mild weakness but symmetric signs of impingement with positive Neer and Hawkin's tests, empty can sign. Speeds and Yergason's tests normal. No labral pathology noted with negative Obrien's, negative clunk and good stability. Normal scapular function observed. No painful arc and no drop arm sign. No apprehension sign  No significant change from previous exam.     Impression and Recommendations:     This case required medical decision making of moderate complexity.

## 2015-07-16 NOTE — Progress Notes (Signed)
Pre visit review using our clinic review tool, if applicable. No additional management support is needed unless otherwise documented below in the visit note. 

## 2015-07-16 NOTE — Patient Instructions (Addendum)
Good to see you Appreciate you coming in  Continue the gabapentin at same dose Consider tart cherry extract See me again when you need me.

## 2015-07-16 NOTE — Assessment & Plan Note (Signed)
Discussed with patient at great length. 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 Respond well to the gabapentin on a regular basis.. Low dose. We do have room to increase if necessary. Patient will try this medication. No other significant changes. Patient knows if any worsening symptoms we can only consider either injection in the shoulder are potentially epidurals in the neck. Lungs patient does well though we will see him on an as-needed basis.

## 2015-08-27 ENCOUNTER — Ambulatory Visit (INDEPENDENT_AMBULATORY_CARE_PROVIDER_SITE_OTHER): Payer: 59 | Admitting: Internal Medicine

## 2015-08-27 ENCOUNTER — Encounter: Payer: Self-pay | Admitting: Internal Medicine

## 2015-08-27 VITALS — BP 132/70 | HR 63 | Temp 98.2°F | Wt 333.0 lb

## 2015-08-27 DIAGNOSIS — R600 Localized edema: Secondary | ICD-10-CM | POA: Diagnosis not present

## 2015-08-27 DIAGNOSIS — Z96652 Presence of left artificial knee joint: Secondary | ICD-10-CM

## 2015-08-27 DIAGNOSIS — N3000 Acute cystitis without hematuria: Secondary | ICD-10-CM

## 2015-08-27 DIAGNOSIS — R300 Dysuria: Secondary | ICD-10-CM | POA: Diagnosis not present

## 2015-08-27 LAB — POCT URINALYSIS DIPSTICK
BILIRUBIN UA: NEGATIVE
GLUCOSE UA: NEGATIVE
Ketones, UA: NEGATIVE
NITRITE UA: NEGATIVE
SPEC GRAV UA: 1.02
UROBILINOGEN UA: 0.2
pH, UA: 5

## 2015-08-27 MED ORDER — SULFAMETHOXAZOLE-TRIMETHOPRIM 800-160 MG PO TABS: 1.0000 | ORAL_TABLET | Freq: Two times a day (BID) | ORAL | Status: DC

## 2015-08-27 MED ORDER — HYDROCODONE-ACETAMINOPHEN 10-325 MG PO TABS: 1.0000 | ORAL_TABLET | Freq: Four times a day (QID) | ORAL | Status: DC | PRN

## 2015-08-27 NOTE — Progress Notes (Signed)
Subjective:  Patient ID: Johnathan [name], male    DOB: 1956-02-14  Age: 59 y.o. MRN: [id number]  CC: The primary encounter diagnosis was Dysuria. Diagnoses of Acute cystitis without hematuria, Bilateral leg edema, Morbid obesity, unspecified obesity type (HCC), and S/P left unicompartmental knee replacement were also pertinent to this visit.  HPI Johnathan [name] presents for follow up on atrial fib,  Chronic edema and chronic pain secondary to DJD and DDD,    Underwent cardioversion for a fib in early September,  EF 40 to 45% September . morning dose of Lopressr was reduced to 50 mg  by cardiology and digoxin suspended for fatigue and bradycardia post procedure.  Howevere he had torresume the digoxin secondary to recurrent  racing heart and dyspnea ,  Along with chest pain e.    Untreated OSA contributing to edema and fatigue   has lost 20 lb since August  Half is water weight,  Takes one lasix daily in the early evening .  Drinkgin diet soda.   Has been having dysuria, frequency,  Every 90 minutes,  Urine is cloudy   Symptoms have been present for 3 weeks and started with chills and rigors.      Outpatient Prescriptions Prior to Visit  Medication Sig Dispense Refill  . amiodarone (PACERONE) 200 MG tablet Take 1 tablet (200 mg total) by mouth 2 (two) times daily. 180 tablet 3  . B Complex Vitamins (B COMPLEX 50 PO) Take by mouth daily.    Marland Kitchen buPROPion (WELLBUTRIN XL) 300 MG 24 hr tablet Take 1 tablet by mouth  daily 90 tablet 3  . Cholecalciferol (VITAMIN D3) 2000 UNITS TABS Take by mouth daily.    . dabigatran (PRADAXA) 150 MG CAPS capsule Take 1 capsule (150 mg total) by mouth 2 (two) times daily. 180 capsule 3  . FERROUS GLUCONATE PO Take by mouth daily.    . furosemide (LASIX) 40 MG tablet Take 1 tablet (40 mg total) by mouth 2 (two) times daily as needed. 180 tablet 3  . gabapentin (NEURONTIN) 100 MG capsule Take 1 capsule (100 mg total) by mouth every 6 (six) hours. 360 capsule 3    . GLUCOSAMINE HCL-MSM PO Take by mouth daily.    Marland Kitchen losartan-hydrochlorothiazide (HYZAAR) 50-12.5 MG per tablet Take 1 tablet by mouth  daily 90 tablet 2  . meloxicam (MOBIC) 15 MG tablet Take 1 tablet by mouth  every day 90 tablet 3  . metoprolol succinate (TOPROL-XL) 100 MG 24 hr tablet Take 1 tablet (100 mg total) by mouth 2 (two) times daily. Take with or immediately following a meal. 60 tablet 11  . Multiple Vitamin (MULTIVITAMIN) tablet Take 1 tablet by mouth daily.    . nitroGLYCERIN (NITROSTAT) 0.4 MG SL tablet Place 1 tablet (0.4 mg total) under the tongue every 5 (five) minutes as needed for chest pain. 30 tablet 3  . Potassium Chloride ER 20 MEQ TBCR Take 20 mEq by mouth 2 (two) times daily as needed. 180 tablet 3  . vitamin B-12 (CYANOCOBALAMIN) 500 MCG tablet Take 500 mcg by mouth daily.    . vitamin E 400 UNIT capsule Take 400 Units by mouth daily.    Marland Kitchen HYDROcodone-acetaminophen (NORCO) 10-325 MG per tablet Take 1 tablet by mouth every 6 (six) hours as needed. 120 tablet 0  . triamcinolone cream (KENALOG) 0.1 % Apply 1 application topically 2 (two) times daily. (Patient not taking: Reported on 08/27/2015) 30 g 0   No facility-administered medications  prior to visit.    Review of Systems;  Patient denies headache, fevers, malaise, unintentional weight loss, skin rash, eye pain, sinus congestion and sinus pain, sore throat, dysphagia,  hemoptysis , cough, dyspnea, wheezing, chest pain, palpitations, orthopnea, edema, abdominal pain, nausea, melena, diarrhea, constipation, flank pain, dysuria, hematuria, urinary  Frequency, nocturia, numbness, tingling, seizures,  Focal weakness, Loss of consciousness,  Tremor, insomnia, depression, anxiety, and suicidal ideation.      Objective:  BP 132/70 mmHg  Pulse 63  Temp(Src) 98.2 F (36.8 C) (Oral)  Wt 333 lb (151.048 kg)  SpO2 95%  BP Readings from Last 3 Encounters:  08/27/15 132/70  07/16/15 130/84  07/01/15 128/74    Wt  Readings from Last 3 Encounters:  08/27/15 333 lb (151.048 kg)  07/16/15 339 lb (153.769 kg)  07/01/15 347 lb (157.398 kg)    General appearance: alert, cooperative and appears stated age Ears: normal TM's and external ear canals both ears Throat: lips, mucosa, and tongue normal; teeth and gums normal Neck: no adenopathy, no carotid bruit, supple, symmetrical, trachea midline and thyroid not enlarged, symmetric, no tenderness/mass/nodules Back: symmetric, no curvature. ROM normal. No CVA tenderness. Lungs: clear to auscultation bilaterally Heart: regular rate and rhythm, S1, S2 normal, no murmur, click, rub or gallop Abdomen: soft, non-tender; bowel sounds normal; no masses,  no organomegaly Pulses: 2+ and symmetric Skin: Skin color, texture, turgor normal. No rashes or lesions Lymph nodes: Cervical, supraclavicular, and axillary nodes normal.  No results found for: HGBA1C  Lab Results  Component Value Date   CREATININE 0.98 04/08/2015   CREATININE 1.2 04/30/2014   CREATININE 0.9 01/28/2014    Lab Results  Component Value Date   WBC 7.1 04/08/2015   HGB 14.9 04/08/2015   HCT 43.0 04/08/2015   PLT 130.0* 04/08/2015   GLUCOSE 92 04/08/2015   CHOL 126 07/09/2013   TRIG 97 07/09/2013   HDL 50 07/09/2013   LDLCALC 57 07/09/2013   ALT 26 04/30/2014   AST 23 04/30/2014   NA 142 04/08/2015   K 4.5 04/08/2015   CL 105 04/08/2015   CREATININE 0.98 04/08/2015   BUN 25* 04/08/2015   CO2 21 04/08/2015   TSH 1.73 04/08/2015   INR 1.2 07/16/2013    No results found.  Assessment & Plan:   Problem List Items Addressed This Visit    Severe obesity (BMI >= 40) (HCC)    Despite priro bariatric surgery.  Aggravated by inactivity  And joint pain . I have addressed  BMI and recommended wt loss of 10% of body weigh over the next 6 months using a low glycemic index diet and regular exercise a minimum of 5 days per week.          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  Refills on vicodin 10/325 given,         Bilateral leg edema    Multifactorial, with systolic dysfunction, untreated OSA,  and VI. unable to wear compression stockings.  Continue daily lasix,          Urinary tract infection    empiric septra DS  X 2 weeks pending urine culture.       Relevant Medications   sulfamethoxazole-trimethoprim (BACTRIM DS,SEPTRA DS) 800-160 MG tablet    Other Visit Diagnoses    Dysuria    -  Primary  Relevant Orders    POCT urinalysis dipstick (Completed)    Urinalysis, [facility] w reflex microscopic ([facility]) (Completed)    Urine culture (Completed)       I have changed Mr. Barnabas HarriesRandell's HYDROcodone-acetaminophen. I am also having him start on sulfamethoxazole-trimethoprim. Additionally, I am having him maintain his FERROUS GLUCONATE PO, multivitamin, B Complex Vitamins (B COMPLEX 50 PO), vitamin B-12, Vitamin D3, GLUCOSAMINE HCL-MSM PO, vitamin E, nitroGLYCERIN, triamcinolone cream, metoprolol succinate, losartan-hydrochlorothiazide, dabigatran, furosemide, Potassium Chloride ER, buPROPion, meloxicam, amiodarone, gabapentin, and HYDROcodone-acetaminophen.  Meds ordered this encounter  Medications  . sulfamethoxazole-trimethoprim (BACTRIM DS,SEPTRA DS) 800-160 MG tablet    Sig: Take 1 tablet by mouth 2 (two) times daily.    Dispense:  28 tablet    Refill:  0  . DISCONTD: HYDROcodone-acetaminophen (NORCO) 10-325 MG tablet    Sig: Take 1 tablet by mouth every 6 (six) hours as needed.    Dispense:  120 tablet    Refill:  0  . HYDROcodone-acetaminophen (NORCO) 10-325 MG tablet    Sig: Take 1 tablet by mouth every 6 (six) hours as needed.    Dispense:  120 tablet    Refill:  0    May refill on or After September 26 2015  . HYDROcodone-acetaminophen (NORCO) 10-325 MG tablet    Sig: Take 1 tablet by mouth every 6 (six) hours as  needed.    Dispense:  120 tablet    Refill:  0    May refill on or after October 27 2015    Medications Discontinued During This Encounter  Medication Reason  . HYDROcodone-acetaminophen (NORCO) 10-325 MG per tablet Reorder  . HYDROcodone-acetaminophen (NORCO) 10-325 MG per tablet Reorder  . HYDROcodone-acetaminophen (NORCO) 10-325 MG tablet Reorder    Follow-up: Return in about 3 months (around 11/27/2015).   Sherlene ShamsULLO, Ericia Moxley L, MD

## 2015-08-27 NOTE — Patient Instructions (Signed)
I am treating your for cystitis with Septra DS.  ONe tablet daily with food for two weeks  Please take a probiotic ( Align, Floraque or Culturelle) while you are on the antibiotic to prevent a serious antibiotic associated diarrhea  Called clostirudium dificile colitis   Your metoprolol dose should be 50 mg in the am and 100 mg in the PM

## 2015-08-28 LAB — URINALYSIS, ROUTINE W REFLEX MICROSCOPIC
Bilirubin Urine: NEGATIVE
Ketones, ur: NEGATIVE
Nitrite: NEGATIVE
PH: 5.5 (ref 5.0–8.0)
RBC / HPF: NONE SEEN (ref 0–?)
Specific Gravity, Urine: 1.02 (ref 1.000–1.030)
TOTAL PROTEIN, URINE-UPE24: NEGATIVE
URINE GLUCOSE: NEGATIVE
UROBILINOGEN UA: 0.2 (ref 0.0–1.0)

## 2015-08-30 ENCOUNTER — Encounter: Payer: Self-pay | Admitting: Internal Medicine

## 2015-08-30 DIAGNOSIS — N390 Urinary tract infection, site not specified: Secondary | ICD-10-CM | POA: Insufficient documentation

## 2015-08-30 LAB — URINE CULTURE

## 2015-08-30 NOTE — Assessment & Plan Note (Signed)
Despite priro bariatric surgery.  Aggravated by inactivity  And joint pain . I have addressed  BMI and recommended wt loss of 10% of body weigh over the next 6 months using a low glycemic index diet and regular exercise a minimum of 5 days per week.

## 2015-08-30 NOTE — Assessment & Plan Note (Signed)
Multifactorial, with systolic dysfunction, untreated OSA,  and VI. unable to wear compression stockings.  Continue daily lasix,

## 2015-08-30 NOTE — Assessment & Plan Note (Signed)
He has daily Chronic pain aggravated by morbid obesity and  Job requirements of standing daly for > 8 hours on cement floor.  Refills on vicodin 10/325 given,

## 2015-08-30 NOTE — Assessment & Plan Note (Signed)
empiric septra DS  X 2 weeks pending urine culture.

## 2015-09-01 ENCOUNTER — Encounter: Payer: Self-pay | Admitting: Internal Medicine

## 2015-09-17 NOTE — Telephone Encounter (Signed)
Mailed unread message to patient.  

## 2015-10-10 ENCOUNTER — Ambulatory Visit: Payer: 59 | Admitting: Cardiovascular Disease

## 2015-10-22 ENCOUNTER — Other Ambulatory Visit: Payer: Self-pay

## 2015-10-22 MED ORDER — METOPROLOL SUCCINATE ER 50 MG PO TB24: 50.0000 mg | ORAL_TABLET | Freq: Two times a day (BID) | ORAL | Status: DC

## 2015-10-22 NOTE — Telephone Encounter (Signed)
Pt would like 50 mg tablets

## 2015-10-27 ENCOUNTER — Ambulatory Visit (INDEPENDENT_AMBULATORY_CARE_PROVIDER_SITE_OTHER): Payer: 59 | Admitting: Cardiovascular Disease

## 2015-10-27 ENCOUNTER — Encounter: Payer: Self-pay | Admitting: Cardiovascular Disease

## 2015-10-27 VITALS — BP 128/62 | HR 63 | Ht 75.0 in | Wt 348.0 lb

## 2015-10-27 DIAGNOSIS — I10 Essential (primary) hypertension: Secondary | ICD-10-CM | POA: Diagnosis not present

## 2015-10-27 DIAGNOSIS — I480 Paroxysmal atrial fibrillation: Secondary | ICD-10-CM

## 2015-10-27 DIAGNOSIS — I5033 Acute on chronic diastolic (congestive) heart failure: Secondary | ICD-10-CM

## 2015-10-27 DIAGNOSIS — I4891 Unspecified atrial fibrillation: Secondary | ICD-10-CM | POA: Diagnosis not present

## 2015-10-27 DIAGNOSIS — G4733 Obstructive sleep apnea (adult) (pediatric): Secondary | ICD-10-CM

## 2015-10-27 DIAGNOSIS — R0602 Shortness of breath: Secondary | ICD-10-CM

## 2015-10-27 DIAGNOSIS — R600 Localized edema: Secondary | ICD-10-CM

## 2015-10-27 NOTE — Assessment & Plan Note (Signed)
Currently not on CPAP 

## 2015-10-27 NOTE — Patient Instructions (Signed)
You are doing well. No medication changes were made.  Please call us if you have new issues that need to be addressed before your next appt.  Your physician wants you to follow-up in: 6 months.  You will receive a reminder letter in the mail two months in advance. If you don't receive a letter, please call our office to schedule the follow-up appointment.   

## 2015-10-27 NOTE — Assessment & Plan Note (Signed)
Edema likely a combination of chronic diastolic CHF, also leg swelling from venous insufficiency.  Recommended compression hose if tolerated

## 2015-10-27 NOTE — Assessment & Plan Note (Signed)
Blood pressure is well controlled on today's visit. No changes made to the medications. 

## 2015-10-27 NOTE — Progress Notes (Signed)
Patient ID: Johnathan [name], male    DOB: 10/21/1955, 60 y.o.   MRN: [id number]  HPI Comments: Johnathan [name] is a pleasant 60 year old gentleman with obesity, chronic joint pain of the shoulders and knees on long-term low-dose narcotics, hypertension, history of bariatric surgery, atrial fibrillation in August 2013, normal ejection fraction at that time who presents for follow-up of his atrial fibrillation. History of sleep apnea, currently not on his CPAP   in follow-up today, he reports that he ran out of metoprolol at the very end of December 2016.  Metoprolol is on order and should arrive soon  Developed atrial fibrillation last week, took 2 propranolol, waited several hours, converted back to normal sinus rhythm  otherwise reports that he feels well with no complaints  Continues to take amiodarone 200 mg twice a day  Previously was taking metoprolol succinate 50 mg in the morning, 100 mg at night.  He works the night shift  He has been taking Lasix 40 mg at night, sometimes misses a dose.  he does have leg edema.  Misses doses on weekends   EKG on today's visit shows normal sinus rhythm with rate 63 bpm, APCs, no significant ST or T-wave changes   other past medical history  previous cardioversion,successful in restoring normal sinus rhythm   tick bite in June 2016, he developed atrial fibrillation initially with RVR. Started on digoxin, continued on metoprolol and diltiazem with improved heart rate. Echocardiogram and stress test at that time Cardiac exam shows slightly depressed ejection fraction, dilated left atrium, mild LVH. Ejection fraction estimated at 40-45%. Stress test with small region of apical perfusion defect, depressed ejection fraction.  Previously reported having tachycardia when he feels hot, improved when he cools down Previously had significant shoulder pain Prior clinic visit with shortness of breath that improved with Lasix  In the hospital 05/31/2012 with  left-sided chest pain and palpitations. He was given adenosine in the emergency room that showed atrial fibrillation. He received Cardizem and started on diltiazem infusion and converted to normal sinus rhythm overnight. He was started on anticoagulation, continued on long-acting Cardizem and was discharged after echocardiogram showed normal LV function.  Echocardiogram showed normal LV function with ejection fraction 50-55%, normal right ventricular systolic pressure, normal left atrium TSH 1.7  Event monitor was performed September 16 2 07/25/2012 that showed normal sinus rhythm with PVCs, no significant atrial fibrillation Blood pressure at home has been labile, typically in the 130-140 systolic range.    No Known Allergies  Outpatient Encounter Prescriptions as of 10/27/2015  Medication Sig  . amiodarone (PACERONE) 200 MG tablet Take 1 tablet (200 mg total) by mouth 2 (two) times daily.  . B Complex Vitamins (B COMPLEX 50 PO) Take by mouth daily.  Marland Kitchen. buPROPion (WELLBUTRIN XL) 300 MG 24 hr tablet Take 1 tablet by mouth  daily  . Cholecalciferol (VITAMIN D3) 2000 UNITS TABS Take by mouth daily.  . dabigatran (PRADAXA) 150 MG CAPS capsule Take 1 capsule (150 mg total) by mouth 2 (two) times daily.  Marland Kitchen. FERROUS GLUCONATE PO Take by mouth daily.  . furosemide (LASIX) 40 MG tablet Take 1 tablet (40 mg total) by mouth 2 (two) times daily as needed.  . gabapentin (NEURONTIN) 100 MG capsule Take 1 capsule (100 mg total) by mouth every 6 (six) hours.  Marland Kitchen. GLUCOSAMINE HCL-MSM PO Take by mouth daily.  Marland Kitchen. HYDROcodone-acetaminophen (NORCO) 10-325 MG tablet Take 1 tablet by mouth every 6 (six) hours as needed.  .Marland Kitchen  HYDROcodone-acetaminophen (NORCO) 10-325 MG tablet Take 1 tablet by mouth every 6 (six) hours as needed.  Marland Kitchen losartan-hydrochlorothiazide (HYZAAR) 50-12.5 MG per tablet Take 1 tablet by mouth  daily  . meloxicam (MOBIC) 15 MG tablet Take 1 tablet by mouth  every day  . metoprolol succinate  (TOPROL-XL) 50 MG 24 hr tablet Take 1 tablet (50 mg total) by mouth 2 (two) times daily. Take with or immediately following a meal.  . Multiple Vitamin (MULTIVITAMIN) tablet Take 1 tablet by mouth daily.  . nitroGLYCERIN (NITROSTAT) 0.4 MG SL tablet Place 1 tablet (0.4 mg total) under the tongue every 5 (five) minutes as needed for chest pain.  Marland Kitchen Potassium Chloride ER 20 MEQ TBCR Take 20 mEq by mouth 2 (two) times daily as needed.  . vitamin B-12 (CYANOCOBALAMIN) 500 MCG tablet Take 500 mcg by mouth daily.  . vitamin E 400 UNIT capsule Take 400 Units by mouth daily.  . propranolol (INDERAL) 20 MG tablet Take 1 tablet (20 mg total) by mouth 3 (three) times daily.  . [DISCONTINUED] sulfamethoxazole-trimethoprim (BACTRIM DS,SEPTRA DS) 800-160 MG tablet Take 1 tablet by mouth 2 (two) times daily. (Patient not taking: Reported on 10/27/2015)  . [DISCONTINUED] triamcinolone cream (KENALOG) 0.1 % Apply 1 application topically 2 (two) times daily. (Patient not taking: Reported on 10/27/2015)   No facility-administered encounter medications on file as of 10/27/2015.    Past Medical History  Diagnosis Date  . Hypertension   . B12 deficiency   . Arthritis     a. chronic joint pain  . Obesity   . History of DVT (deep vein thrombosis) 2004    s/p bariatric surgery  . PAF (paroxysmal atrial fibrillation) (HCC)     a. CHADSVASc at least 2 (HTN and vascular disease); b. on Eliquis   . OSA (obstructive sleep apnea)     a. not compliant with CPAP  . Chronic systolic CHF (congestive heart failure) (HCC)     a. echo 2013: EF of 50-55%, normal right ventricular systolic pressure, normal left atrium; b. EF 40-45%, inadequate for LV wall motion, mild MR, LA severely dilated @ 52 mm, PASP nl  . History of stress test     a. there was no ST segment deviation noted during stress,    There is a small defect of moderate severity present in the apex location, suggestive of apical ischemia, intermediate risk,  calculated EF 21% but visually appears to be 35-40%    Past Surgical History  Procedure Laterality Date  . Bariatric surgery  2004    [place] Y   . Arthroscopic repair acl  1993    bilateral knee  . Cholecystectomy  1982  . Meniscus debridement  2011    Left knee  . Replacement total knee Left   . Bariatric surgery    . Electrophysiologic study N/A 06/20/2015    Procedure: CARDIOVERSION;  Surgeon: Antonieta Iba, MD;  Location: [facility] ORS;  Service: Cardiovascular;  Laterality: N/A;    Social History  reports that he has never smoked. He has never used smokeless tobacco. He reports that he drinks about 1.2 oz of alcohol per week. He reports that he does not use illicit drugs.  Family History Family history is unknown by patient.  Review of Systems  Constitutional: Negative.   Respiratory: Negative.   Cardiovascular: Positive for leg swelling.  Gastrointestinal: Negative.   Musculoskeletal: Positive for joint swelling, arthralgias and gait problem.  Skin: Negative.   Neurological: Negative.  Hematological: Negative.   Psychiatric/Behavioral: Negative.   All other systems reviewed and are negative.   BP 128/62 mmHg  Pulse 63  Ht 6\' 3"  (1.905 m)  Wt 348 lb (157.852 kg)  BMI 43.50 kg/m2  Physical Exam  Constitutional: He is oriented to person, place, and time. He appears well-developed and well-nourished.  obese  HENT:  Head: Normocephalic.  Nose: Nose normal.  Mouth/Throat: Oropharynx is clear and moist.  Eyes: Conjunctivae are normal. Pupils are equal, round, and reactive to light.  Neck: Normal range of motion. Neck supple. No JVD present.  Cardiovascular: Normal rate, regular rhythm, S1 normal, S2 normal, normal heart sounds and intact distal pulses.  Exam reveals no gallop and no friction rub.   No murmur heard. Trace pitting edema  B/l edema  Pulmonary/Chest: Effort normal and breath sounds normal. No respiratory distress. He has no wheezes. He has no rales. He  exhibits no tenderness.  Abdominal: Soft. Bowel sounds are normal. He exhibits no distension. There is no tenderness.  Musculoskeletal: Normal range of motion. He exhibits no edema or tenderness.  Lymphadenopathy:    He has no cervical adenopathy.  Neurological: He is alert and oriented to person, place, and time. Coordination normal.  Skin: Skin is warm and dry. No rash noted. No erythema.  Psychiatric: He has a normal mood and affect. His behavior is normal. Judgment and thought content normal.      Assessment and Plan   Nursing note and vitals reviewed.

## 2015-10-27 NOTE — Assessment & Plan Note (Signed)
Encouraged him to double up on his Lasix  On weekends on weekdays for leg edema, try not to miss any doses  Of Lasix on weekends,  Try to minimize his fluid intake

## 2015-10-27 NOTE — Assessment & Plan Note (Signed)
Recent episode of atrial fibrillation  Encouraged him to restart his metoprolol  For breakthrough arrhythmia, would take propranolol, extra amiodarone  He is on anticoagulation

## 2015-10-28 ENCOUNTER — Other Ambulatory Visit: Payer: Self-pay

## 2015-10-28 MED ORDER — METOPROLOL SUCCINATE ER 50 MG PO TB24: 50.0000 mg | ORAL_TABLET | Freq: Two times a day (BID) | ORAL | Status: DC

## 2015-10-28 NOTE — Telephone Encounter (Signed)
30 day supply. Pt waiting on [facility].

## 2015-10-28 NOTE — Telephone Encounter (Signed)
This encounter was created in error - please disregard.

## 2015-11-26 ENCOUNTER — Telehealth: Payer: Self-pay | Admitting: Internal Medicine

## 2015-11-26 MED ORDER — HYDROCODONE-ACETAMINOPHEN 10-325 MG PO TABS: 1.0000 | ORAL_TABLET | Freq: Four times a day (QID) | ORAL | Status: DC | PRN

## 2015-11-26 NOTE — Telephone Encounter (Signed)
Pt appt was resch due to Provider not in the office on 11/28/15, Pt wife states pt does not have anymore HYDROcodone-acetaminophen (NORCO) 10-325 MG tablet. Pt appt was resch to 12/04/15. Please call wife @ [phone]. Thank you!

## 2015-11-26 NOTE — Addendum Note (Signed)
Addended by: Sherlene Shams on: 11/26/2015 12:18 PM   Modules accepted: Orders

## 2015-11-26 NOTE — Telephone Encounter (Signed)
Spoke with Wife, prescription is ready to be picked up.

## 2015-11-26 NOTE — Telephone Encounter (Signed)
Last filled on 08/27/15, please advise for refill as appt is net week.  Thanks

## 2015-11-26 NOTE — Telephone Encounter (Signed)
Refill printed 

## 2015-11-28 ENCOUNTER — Ambulatory Visit: Payer: 59 | Admitting: Internal Medicine

## 2015-12-04 ENCOUNTER — Encounter: Payer: Self-pay | Admitting: Internal Medicine

## 2015-12-04 ENCOUNTER — Ambulatory Visit (INDEPENDENT_AMBULATORY_CARE_PROVIDER_SITE_OTHER): Payer: 59 | Admitting: Internal Medicine

## 2015-12-04 VITALS — BP 144/82 | HR 51 | Temp 98.0°F | Resp 14 | Ht 75.0 in | Wt 356.8 lb

## 2015-12-04 DIAGNOSIS — M064 Inflammatory polyarthropathy: Secondary | ICD-10-CM

## 2015-12-04 DIAGNOSIS — M130 Polyarthritis, unspecified: Secondary | ICD-10-CM

## 2015-12-04 DIAGNOSIS — M25579 Pain in unspecified ankle and joints of unspecified foot: Secondary | ICD-10-CM | POA: Insufficient documentation

## 2015-12-04 DIAGNOSIS — I480 Paroxysmal atrial fibrillation: Secondary | ICD-10-CM | POA: Diagnosis not present

## 2015-12-04 DIAGNOSIS — M25571 Pain in right ankle and joints of right foot: Secondary | ICD-10-CM

## 2015-12-04 MED ORDER — METOPROLOL SUCCINATE ER 25 MG PO TB24: 25.0000 mg | ORAL_TABLET | Freq: Every day | ORAL | Status: DC

## 2015-12-04 MED ORDER — HYDROCODONE-ACETAMINOPHEN 10-325 MG PO TABS: 1.0000 | ORAL_TABLET | Freq: Four times a day (QID) | ORAL | Status: DC | PRN

## 2015-12-04 NOTE — Patient Instructions (Addendum)
You can add 800 mg  ibuprofen  Not more than twice daily fo r your  ankle sprain   Ankle support and ice.     referral  To Dr Katrinka Blazing for evaluation  I have reduced your metoprolol dose to 25 mg twice daioy because your heart rate is too slow

## 2015-12-04 NOTE — Progress Notes (Addendum)
Subjective:  Patient ID: Johnathan [name], male    DOB: Jul 22, 1956  Age: 60 y.o. MRN: [id number]  CC: The primary encounter diagnosis was Pain in joint, ankle and foot, right. Diagnoses of Polyarthritis of multiple sites (HCC), PAF (paroxysmal atrial fibrillation) (HCC), and Obesity, Class III, BMI 40-49.9 (morbid obesity) (HCC) were also pertinent to this visit.  HPI NDREW CREASON presents for follow up on chronic joint pain secondary to severe DDD amd morbid obesity . Hypertension,  OSA,  PAF  And LE edema secondary  to combined heart failure  And atrial fibrillation   Cc: ankle pain x 1 week ,  Right side.   No twisting event .   Pain is above the ankle hurts with weight bearing Podiatry referral.  HAS not tried ice yet,.  Discussed podiatry referral   Metoprolol started 3 months ago., along with amiodarone.  Feels the weight gain started .  Not following a specific diet.   Having   GENERALIZED PAIN , SHOULDERS ELBOWS ,  HANDS,  NECK IS CONTINALLY CRACKING .  HAS HAD STEROID INJECTIONS IN THE PAST BY DR Katrinka Blazing,  WITH DIMINISHING RETURNS   NECK PAIN RADIATED IN THE PAST TO LEFT HAND,  LEFT HAND 5TH FINGER STILL HAS SOME NUMBNESS    Outpatient Prescriptions Prior to Visit  Medication Sig Dispense Refill  . amiodarone (PACERONE) 200 MG tablet Take 1 tablet (200 mg total) by mouth 2 (two) times daily. 180 tablet 3  . B Complex Vitamins (B COMPLEX 50 PO) Take by mouth daily.    Marland Kitchen buPROPion (WELLBUTRIN XL) 300 MG 24 hr tablet Take 1 tablet by mouth  daily 90 tablet 3  . Cholecalciferol (VITAMIN D3) 2000 UNITS TABS Take by mouth daily.    . dabigatran (PRADAXA) 150 MG CAPS capsule Take 1 capsule (150 mg total) by mouth 2 (two) times daily. 180 capsule 3  . FERROUS GLUCONATE PO Take by mouth daily.    . furosemide (LASIX) 40 MG tablet Take 1 tablet (40 mg total) by mouth 2 (two) times daily as needed. 180 tablet 3  . gabapentin (NEURONTIN) 100 MG capsule Take 1 capsule (100 mg total) by  mouth every 6 (six) hours. 360 capsule 3  . GLUCOSAMINE HCL-MSM PO Take by mouth daily.    . meloxicam (MOBIC) 15 MG tablet Take 1 tablet by mouth  every day 90 tablet 3  . Multiple Vitamin (MULTIVITAMIN) tablet Take 1 tablet by mouth daily.    . nitroGLYCERIN (NITROSTAT) 0.4 MG SL tablet Place 1 tablet (0.4 mg total) under the tongue every 5 (five) minutes as needed for chest pain. 30 tablet 3  . Potassium Chloride ER 20 MEQ TBCR Take 20 mEq by mouth 2 (two) times daily as needed. 180 tablet 3  . propranolol (INDERAL) 20 MG tablet Take 20 mg by mouth 3 (three) times daily as needed. Reported on 03/22/2016    . vitamin B-12 (CYANOCOBALAMIN) 500 MCG tablet Take 500 mcg by mouth daily.    . vitamin E 400 UNIT capsule Take 400 Units by mouth daily.    Marland Kitchen HYDROcodone-acetaminophen (NORCO) 10-325 MG tablet Take 1 tablet by mouth every 6 (six) hours as needed. 120 tablet 0  . HYDROcodone-acetaminophen (NORCO) 10-325 MG tablet Take 1 tablet by mouth every 6 (six) hours as needed. 120 tablet 0  . losartan-hydrochlorothiazide (HYZAAR) 50-12.5 MG per tablet Take 1 tablet by mouth  daily 90 tablet 2  . metoprolol succinate (TOPROL-XL) 50 MG 24  hr tablet Take 1 tablet (50 mg total) by mouth 2 (two) times daily. Take with or immediately following a meal. 60 tablet 0   No facility-administered medications prior to visit.    Review of Systems;  Patient denies headache, fevers, malaise, unintentional weight loss, skin rash, eye pain, sinus congestion and sinus pain, sore throat, dysphagia,  hemoptysis , cough, dyspnea, wheezing, chest pain, palpitations, orthopnea, edema, abdominal pain, nausea, melena, diarrhea, constipation, flank pain, dysuria, hematuria, urinary  Frequency, nocturia, numbness, tingling, seizures,  Focal weakness, Loss of consciousness,  Tremor, insomnia, depression, anxiety, and suicidal ideation.      Objective:  BP 144/82 mmHg  Pulse 51  Temp(Src) 98 F (36.7 C) (Oral)  Resp 14  Ht   (1.905 m)  Wt 356 lb 12.8 oz (161.843 kg)  BMI 44.60 kg/m2  SpO2 97%  BP Readings from Last 3 Encounters:  03/22/16 138/68  12/04/15 144/82  10/27/15 128/62    Wt Readings from Last 3 Encounters:  03/22/16 359 lb 8 oz (163.068 kg)  12/04/15 356 lb 12.8 oz (161.843 kg)  10/27/15 348 lb (157.852 kg)    General appearance: alert, cooperative and appears stated age Ears: normal TM's and external ear canals both ears Throat: lips, mucosa, and tongue normal; teeth and gums normal Neck: no adenopathy, no carotid bruit, supple, symmetrical, trachea midline and thyroid not enlarged, symmetric, no tenderness/mass/nodules Back: symmetric, no curvature. ROM normal. No CVA tenderness. Lungs: clear to auscultation bilaterally Heart: regular rate and rhythm, S1, S2 normal, no murmur, click, rub or gallop Abdomen: soft, non-tender; bowel sounds normal; no masses,  no organomegaly Pulses: 2+ and symmetric Skin: Skin color, texture, turgor normal. No rashes or lesions Lymph nodes: Cervical, supraclavicular, and axillary nodes normal.  No results found for: HGBA1C  Lab Results  Component Value Date   CREATININE 1.01 12/16/2015   CREATININE 0.98 04/08/2015   CREATININE 1.2 04/30/2014    Lab Results  Component Value Date   WBC 7.1 04/08/2015   HGB 14.9 04/08/2015   HCT 43.0 04/08/2015   PLT 130* 04/08/2015   GLUCOSE 87 12/16/2015   CHOL 154 12/16/2015   TRIG 73 12/16/2015   HDL 58 12/16/2015   LDLCALC 81 12/16/2015   ALT 33 12/16/2015   AST 30 12/16/2015   NA 141 12/16/2015   K 4.5 12/16/2015   CL 100 12/16/2015   CREATININE 1.01 12/16/2015   BUN 21 12/16/2015   CO2 23 12/16/2015   TSH 1.73 04/08/2015   INR 1.2 07/16/2013    No results found.  Assessment & Plan:   Problem List Items Addressed This Visit    Obesity, Class III, BMI 40-49.9 (morbid obesity) (HCC)    He has gained 23 lbs  Due to inactivity. I have addressed  BMI and recommended a low glycemic index  diet utilizing smaller more frequent meals to increase metabolism.  I have also recommended that patient start exercising with a goal of 30 minutes of aerobic exercise a minimum of 5 days per week. Screening for lipid disorders, thyroid and diabetes to be done today.        Polyarthritis of multiple sites [facility])    multiple joints , OA aggravated by morbid obesity .  Patient is using vicodin every 6 hours. Patient has not had an escalation of use of narcotics and is reminded not to share medication with others or to combine with alcohol or other sedating medications.  Patient is providing urine sample upon request per  narcotics contract, Refill on narcotic was given today.         PAF (paroxysmal atrial fibrillation) (HCC)    He is bradycardic on current dose of metoprolol.  Dose lowered      Relevant Medications   metoprolol succinate (TOPROL-XL) 25 MG 24 hr tablet   Pain in joint, ankle and foot - Primary      I have discontinued Mr. Naas metoprolol succinate. I am also having him start on metoprolol succinate and HYDROcodone-acetaminophen. Additionally, I am having him maintain his FERROUS GLUCONATE PO, multivitamin, B Complex Vitamins (B COMPLEX 50 PO), vitamin B-12, Vitamin D3, GLUCOSAMINE HCL-MSM PO, vitamin E, nitroGLYCERIN, dabigatran, furosemide, Potassium Chloride ER, buPROPion, meloxicam, amiodarone, gabapentin, propranolol, HYDROcodone-acetaminophen, and HYDROcodone-acetaminophen.  Meds ordered this encounter  Medications  . metoprolol succinate (TOPROL-XL) 25 MG 24 hr tablet    Sig: Take 1 tablet (25 mg total) by mouth daily.    Dispense:  90 tablet    Refill:  3  . HYDROcodone-acetaminophen (NORCO) 10-325 MG tablet    Sig: Take 1 tablet by mouth every 6 (six) hours as needed.    Dispense:  120 tablet    Refill:  0    May refill on or After March, 15 2017  . HYDROcodone-acetaminophen (NORCO) 10-325 MG tablet    Sig: Take 1 tablet by mouth every 6 (six) hours as  needed. May refill on or after Feb 23, 2016    Dispense:  120 tablet    Refill:  0  . HYDROcodone-acetaminophen (NORCO) 10-325 MG tablet    Sig: Take 1 tablet by mouth every 6 (six) hours as needed.    Dispense:  120 tablet    Refill:  0    May refill on or after January 24, 2016    Medications Discontinued During This Encounter  Medication Reason  . metoprolol succinate (TOPROL-XL) 50 MG 24 hr tablet   . HYDROcodone-acetaminophen (NORCO) 10-325 MG tablet Reorder  . HYDROcodone-acetaminophen (NORCO) 10-325 MG tablet Reorder    Follow-up: No Follow-up on file.   Sherlene Shams, MD

## 2015-12-06 NOTE — Assessment & Plan Note (Signed)
multiple joints , OA aggravated by morbid obesity .  Patient is using vicodin every 6 hours. Patient has not had an escalation of use of narcotics and is reminded not to share medication with others or to combine with alcohol or other sedating medications.  Patient is providing urine sample upon request per narcotics contract, Refill on narcotic was given today.

## 2015-12-06 NOTE — Assessment & Plan Note (Signed)
He has gained 23 lbs  Due to inactivity. I have addressed  BMI and recommended a low glycemic index diet utilizing smaller more frequent meals to increase metabolism.  I have also recommended that patient start exercising with a goal of 30 minutes of aerobic exercise a minimum of 5 days per week. Screening for lipid disorders, thyroid and diabetes to be done today.

## 2015-12-06 NOTE — Assessment & Plan Note (Signed)
He is bradycardic on current dose of metoprolol.  Dose lowered

## 2015-12-16 ENCOUNTER — Other Ambulatory Visit: Payer: Self-pay | Admitting: Internal Medicine

## 2015-12-17 LAB — COMPREHENSIVE METABOLIC PANEL
A/G RATIO: 1.4 (ref 1.1–2.5)
ALK PHOS: 94 IU/L (ref 39–117)
ALT: 33 IU/L (ref 0–44)
AST: 30 IU/L (ref 0–40)
Albumin: 4.2 g/dL (ref 3.6–4.8)
BILIRUBIN TOTAL: 0.6 mg/dL (ref 0.0–1.2)
BUN/Creatinine Ratio: 21 (ref 10–22)
BUN: 21 mg/dL (ref 8–27)
CHLORIDE: 100 mmol/L (ref 96–106)
CO2: 23 mmol/L (ref 18–29)
Calcium: 8.8 mg/dL (ref 8.6–10.2)
Creatinine, Ser: 1.01 mg/dL (ref 0.76–1.27)
GFR calc Af Amer: 93 mL/min/{1.73_m2} (ref >59)
GFR, EST NON AFRICAN AMERICAN: 80 mL/min/{1.73_m2} (ref >59)
GLOBULIN, TOTAL: 2.9 g/dL (ref 1.5–4.5)
Glucose: 87 mg/dL (ref 65–99)
POTASSIUM: 4.5 mmol/L (ref 3.5–5.2)
SODIUM: 141 mmol/L (ref 134–144)
Total Protein: 7.1 g/dL (ref 6.0–8.5)

## 2015-12-17 LAB — LIPID PANEL W/O CHOL/HDL RATIO
Cholesterol, Total: 154 mg/dL (ref 100–199)
HDL: 58 mg/dL (ref >39)
LDL Calculated: 81 mg/dL (ref 0–99)
TRIGLYCERIDES: 73 mg/dL (ref 0–149)
VLDL Cholesterol Cal: 15 mg/dL (ref 5–40)

## 2015-12-17 LAB — VITAMIN D 25 HYDROXY (VIT D DEFICIENCY, FRACTURES): VIT D 25 HYDROXY: 27.9 ng/mL — AB (ref 30.0–100.0)

## 2015-12-21 ENCOUNTER — Encounter: Payer: Self-pay | Admitting: Internal Medicine

## 2016-01-05 NOTE — Telephone Encounter (Signed)
Mailed unread message to patient.  

## 2016-01-13 ENCOUNTER — Other Ambulatory Visit: Payer: Self-pay | Admitting: Internal Medicine

## 2016-03-19 ENCOUNTER — Ambulatory Visit: Payer: 59 | Admitting: Internal Medicine

## 2016-03-22 ENCOUNTER — Encounter: Payer: Self-pay | Admitting: Internal Medicine

## 2016-03-22 ENCOUNTER — Ambulatory Visit (INDEPENDENT_AMBULATORY_CARE_PROVIDER_SITE_OTHER): Payer: 59

## 2016-03-22 ENCOUNTER — Ambulatory Visit (INDEPENDENT_AMBULATORY_CARE_PROVIDER_SITE_OTHER): Payer: 59 | Admitting: Internal Medicine

## 2016-03-22 VITALS — BP 138/68 | HR 56 | Temp 98.3°F | Resp 14 | Ht 75.0 in | Wt 359.5 lb

## 2016-03-22 DIAGNOSIS — Z79899 Other long term (current) drug therapy: Secondary | ICD-10-CM

## 2016-03-22 DIAGNOSIS — R200 Anesthesia of skin: Secondary | ICD-10-CM

## 2016-03-22 DIAGNOSIS — R208 Other disturbances of skin sensation: Secondary | ICD-10-CM

## 2016-03-22 DIAGNOSIS — E785 Hyperlipidemia, unspecified: Secondary | ICD-10-CM | POA: Insufficient documentation

## 2016-03-22 DIAGNOSIS — M542 Cervicalgia: Secondary | ICD-10-CM

## 2016-03-22 DIAGNOSIS — G629 Polyneuropathy, unspecified: Secondary | ICD-10-CM | POA: Diagnosis not present

## 2016-03-22 DIAGNOSIS — M159 Polyosteoarthritis, unspecified: Secondary | ICD-10-CM

## 2016-03-22 MED ORDER — CELECOXIB 200 MG PO CAPS: 200.0000 mg | ORAL_CAPSULE | Freq: Two times a day (BID) | ORAL | Status: DC

## 2016-03-22 MED ORDER — HYDROCODONE-ACETAMINOPHEN 10-325 MG PO TABS: 1.0000 | ORAL_TABLET | Freq: Four times a day (QID) | ORAL | Status: DC | PRN

## 2016-03-22 NOTE — Assessment & Plan Note (Signed)
Chronic pain aggravated by morbid obesity.  I am refilling vicodin 10/325 today for 3 months

## 2016-03-22 NOTE — Progress Notes (Signed)
Subjective:  Patient ID: Johnathan DawesEdward A Frankl, male    DOB: 25-Mar-1956  Age: 60 y.o. MRN: [id number]  CC: The primary encounter diagnosis was Long-term use of high-risk medication. 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  HPI Johnathan Dawesdward A Reiswig presents for follow up on several issues:   1) persistent numbness of fingers on right hand limited to the pads of thumb, first second  and third fingers.  Can't tell how much pressur he is exerting so he has been dropping things. For the  past week.  2) Pain and stiffness of the joints in his right hand, severe.  Hurts to make a fist. .   3) burning needle like pain affecting both fee. Symptoms started about a year ago, intermittent initially oncly occurreing at night but initially not t every night .  For the last several weeks the symptoms occur very night ,  And are now followed by feeling of numbness int he vbottom of the feet in the morning.  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   Taking 4000 Ius of  D3 daily ,  After last dose was 27 on 20000 Ius daily .  Psychiatric nurseactory worker   Outpatient Prescriptions Prior to Visit  Medication Sig Dispense Refill  . amiodarone (PACERONE) 200 MG tablet Take 1 tablet (200 mg total) by mouth 2 (two) times daily. 180 tablet 3  . B Complex Vitamins (B COMPLEX 50 PO) Take by mouth daily.    Marland Kitchen. buPROPion (WELLBUTRIN XL) 300 MG 24 hr tablet Take 1 tablet by mouth  daily 90 tablet 3  . Cholecalciferol (VITAMIN D3) 2000 UNITS TABS Take by mouth daily.    . dabigatran (PRADAXA) 150 MG CAPS capsule Take 1 capsule (150 mg total) by mouth 2 (two) times daily. 180 capsule 3  . FERROUS GLUCONATE PO  Take by mouth daily.    . furosemide (LASIX) 40 MG tablet Take 1 tablet (40 mg total) by mouth 2 (two) times daily as needed. 180 tablet 3  . gabapentin (NEURONTIN) 100 MG capsule Take 1 capsule (100 mg total) by mouth every 6 (six) hours. 360 capsule 3  . GLUCOSAMINE HCL-MSM PO Take by mouth daily.    Marland Kitchen. losartan-hydrochlorothiazide (HYZAAR) 50-12.5 MG tablet Take 1 tablet by mouth  daily 90 tablet 1  . metoprolol succinate (TOPROL-XL) 25 MG 24 hr tablet Take 1 tablet (25 mg total) by mouth daily. 90 tablet 3  . Multiple Vitamin (MULTIVITAMIN) tablet Take 1 tablet by mouth daily.    . nitroGLYCERIN (NITROSTAT) 0.4 MG SL tablet Place 1 tablet (0.4 mg total) under the tongue every 5 (five) minutes as needed for chest pain. 30 tablet 3  . Potassium Chloride ER 20 MEQ TBCR Take 20 mEq by mouth 2 (two) times daily as needed. 180 tablet 3  . propranolol (INDERAL) 20 MG tablet Take 20 mg by mouth 3 (three) times daily as needed. Reported on 03/22/2016    . vitamin B-12 (CYANOCOBALAMIN) 500 MCG tablet Take 500 mcg by mouth daily.    . vitamin E 400 UNIT capsule Take 400 Units by mouth daily.    Marland Kitchen. HYDROcodone-acetaminophen (NORCO) 10-325 MG tablet Take 1 tablet by mouth every  6 (six) hours as needed. 120 tablet 0  . HYDROcodone-acetaminophen (NORCO) 10-325 MG tablet Take 1 tablet by mouth every 6 (six) hours as needed. May refill on or after Feb 23, 2016 120 tablet 0  . HYDROcodone-acetaminophen (NORCO) 10-325 MG tablet Take 1 tablet by mouth every 6 (six) hours as needed. 120 tablet 0  . meloxicam (MOBIC) 15 MG tablet Take 1 tablet by mouth  every day 90 tablet 3   No facility-administered medications prior to visit.    Review of Systems;  Patient denies headache, fevers, malaise, unintentional weight loss, skin rash, eye pain, sinus congestion and sinus pain, sore throat, dysphagia,  hemoptysis , cough, dyspnea, wheezing, chest pain, palpitations, orthopnea, edema, abdominal pain, nausea, melena,  diarrhea, constipation, flank pain, dysuria, hematuria, urinary  Frequency, nocturia, numbness, tingling, seizures,  Focal weakness, Loss of consciousness,  Tremor, insomnia, depression, anxiety, and suicidal ideation.      Objective:  BP 138/68 mmHg  Pulse 56  Temp(Src) 98.3 F (36.8 C) (Oral)  Resp 14  Ht 6\' 3"  (1.905 m)  Wt 359 lb 8 oz (163.068 kg)  BMI 44.93 kg/m2  SpO2 95%  BP Readings from Last 3 Encounters:  03/22/16 138/68  12/04/15 144/82  10/27/15 128/62    Wt Readings from Last 3 Encounters:  03/22/16 359 lb 8 oz (163.068 kg)  12/04/15 356 lb 12.8 oz (161.843 kg)  10/27/15 348 lb (157.852 kg)    General appearance: alert, cooperative and appears stated age Ears: normal TM's and external ear canals both ears Throat: lips, mucosa, and tongue normal; teeth and gums normal Neck: no adenopathy, no carotid bruit, supple, symmetrical, trachea midline and thyroid not enlarged, symmetric, no tenderness/mass/nodules Back: symmetric, no curvature. ROM normal. No CVA tenderness. Lungs: clear to auscultation bilaterally Heart: regular rate and rhythm, S1, S2 normal, no murmur, click, rub or gallop Abdomen: soft, non-tender; bowel sounds normal; no masses,  no organomegaly Pulses: 2+ and symmetric Skin: Skin color, texture, turgor normal. No rashes or lesions Lymph nodes: Cervical, supraclavicular, and axillary nodes normal.  No results found for: HGBA1C  Lab Results  Component Value Date   CREATININE 1.01 12/16/2015   CREATININE 0.98 04/08/2015   CREATININE 1.2 04/30/2014    Lab Results  Component Value Date   WBC 7.1 04/08/2015   HGB 14.9 04/08/2015   HCT 43.0 04/08/2015   PLT 130* 04/08/2015   GLUCOSE 87 12/16/2015   CHOL 154 12/16/2015   TRIG 73 12/16/2015   HDL 58 12/16/2015   LDLCALC 81 12/16/2015   ALT 33 12/16/2015   AST 30 12/16/2015   NA 141 12/16/2015   K 4.5 12/16/2015   CL 100 12/16/2015   CREATININE 1.01 12/16/2015   BUN 21 12/16/2015   CO2  23 12/16/2015   TSH 1.73 04/08/2015   INR 1.2 07/16/2013    No results found.  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  I am refilling vicodin 10/325 today for 3 months         Relevant Medications   celecoxib (CELEBREX) 200 MG capsule   HYDROcodone-acetaminophen (NORCO) 10-325 MG tablet   HYDROcodone-acetaminophen (NORCO) 10-325 MG tablet   HYDROcodone-acetaminophen (NORCO) 10-325 MG tablet   Neuropathy (HCC)    Increasing gabapentin dose, neurolgy referral and screening labs       Relevant Orders  Ambulatory referral to Neurology   Neck pain    Now with bilateral hand numbness..  Repeat plain films today show  Multilevel degenerative changes and bone spurring       Long-term use of high-risk medication - Primary   Hyperlipidemia    Other Visit Diagnoses    Bilateral finger numbness        Relevant Orders    DG Cervical Spine 2 or 3 views (Completed)      A total of 25 minutes of face to face time was spent with patient more than half of which was spent in counselling about the above mentioned conditions  and coordination of care  I have discontinued Mr. Bogan meloxicam. I have also changed his HYDROcodone-acetaminophen. Additionally, I am having him start on celecoxib. Lastly, I am having him maintain his FERROUS GLUCONATE PO, multivitamin, B Complex Vitamins (B COMPLEX 50 PO), vitamin B-12, Vitamin D3, GLUCOSAMINE HCL-MSM PO, vitamin E, nitroGLYCERIN, dabigatran, furosemide, Potassium Chloride ER, buPROPion, amiodarone, gabapentin, propranolol, metoprolol succinate, losartan-hydrochlorothiazide, HYDROcodone-acetaminophen, and HYDROcodone-acetaminophen.  Meds ordered this encounter  Medications  . celecoxib (CELEBREX) 200 MG capsule    Sig: Take 1 capsule (200 mg total) by mouth 2 (two) times daily.    Dispense:  60  capsule    Refill:  2  . HYDROcodone-acetaminophen (NORCO) 10-325 MG tablet    Sig: Take 1 tablet by mouth every 6 (six) hours as needed.    Dispense:  120 tablet    Refill:  0    May refill on or After April 24 2016  . HYDROcodone-acetaminophen (NORCO) 10-325 MG tablet    Sig: Take 1 tablet by mouth every 6 (six) hours as needed. May refill on or after May 25, 2016    Dispense:  120 tablet    Refill:  0  . HYDROcodone-acetaminophen (NORCO) 10-325 MG tablet    Sig: Take 1 tablet by mouth every 6 (six) hours as needed.    Dispense:  120 tablet    Refill:  0    May refill on or after March 25, 2016    Medications Discontinued During This Encounter  Medication Reason  . meloxicam (MOBIC) 15 MG tablet   . HYDROcodone-acetaminophen (NORCO) 10-325 MG tablet Reorder  . HYDROcodone-acetaminophen (NORCO) 10-325 MG tablet Reorder  . HYDROcodone-acetaminophen (NORCO) 10-325 MG tablet Reorder    Follow-up: Return in about 3 months (around 06/22/2016).   Sherlene Shams, MD

## 2016-03-22 NOTE — Assessment & Plan Note (Addendum)
Increasing gabapentin dose, neurolgy referral and screening labs

## 2016-03-22 NOTE — Assessment & Plan Note (Addendum)
Now with bilateral hand numbness..  Repeat plain films today show  Multilevel degenerative changes and bone spurring

## 2016-03-22 NOTE — Progress Notes (Signed)
Pre-visit discussion using our clinic review tool. No additional management support is needed unless otherwise documented below in the visit note.  

## 2016-03-22 NOTE — Patient Instructions (Addendum)
Trial of celebrex instead of meloxicam   Take it twice daily  For arthritis pain   You can increase the gabapentin to 300 in the evening for  your neuropathy .  [facility]  To evaluate for cuase,  Labs at [facility] to rule out b12 deficiency, thryoid and diabetes disorders.   Please go get Plain x rays of the cervical spine at your convenience.  It  would be a good idea to look for bone spurs that may be causing nerve impingement  I am refilling our Hydrocodone  for June, July and August

## 2016-03-23 ENCOUNTER — Telehealth: Payer: Self-pay

## 2016-03-23 NOTE — Telephone Encounter (Signed)
Pa for Celebrex faxed to [facility]

## 2016-03-24 ENCOUNTER — Encounter: Payer: Self-pay | Admitting: Internal Medicine

## 2016-03-26 ENCOUNTER — Ambulatory Visit: Payer: 59 | Admitting: Internal Medicine

## 2016-03-29 ENCOUNTER — Other Ambulatory Visit: Payer: Self-pay | Admitting: Internal Medicine

## 2016-03-30 LAB — LIPID PANEL W/O CHOL/HDL RATIO
Cholesterol, Total: 151 mg/dL (ref 100–199)
HDL: 51 mg/dL (ref >39)
LDL CALC: 89 mg/dL (ref 0–99)
TRIGLYCERIDES: 55 mg/dL (ref 0–149)
VLDL CHOLESTEROL CAL: 11 mg/dL (ref 5–40)

## 2016-03-30 LAB — RPR QUALITATIVE: RPR: NONREACTIVE

## 2016-03-30 LAB — COMPREHENSIVE METABOLIC PANEL
ALT: 30 IU/L (ref 0–44)
AST: 25 IU/L (ref 0–40)
Albumin/Globulin Ratio: 1.8 (ref 1.2–2.2)
Albumin: 4.4 g/dL (ref 3.6–4.8)
Alkaline Phosphatase: 87 IU/L (ref 39–117)
BUN/Creatinine Ratio: 20 (ref 10–24)
BUN: 23 mg/dL (ref 8–27)
Bilirubin Total: 0.7 mg/dL (ref 0.0–1.2)
CALCIUM: 8.6 mg/dL (ref 8.6–10.2)
CO2: 18 mmol/L (ref 18–29)
CREATININE: 1.15 mg/dL (ref 0.76–1.27)
Chloride: 103 mmol/L (ref 96–106)
GFR calc Af Amer: 80 mL/min/{1.73_m2} (ref >59)
GFR, EST NON AFRICAN AMERICAN: 69 mL/min/{1.73_m2} (ref >59)
Globulin, Total: 2.5 g/dL (ref 1.5–4.5)
Glucose: 85 mg/dL (ref 65–99)
Potassium: 4.2 mmol/L (ref 3.5–5.2)
Sodium: 141 mmol/L (ref 134–144)
Total Protein: 6.9 g/dL (ref 6.0–8.5)

## 2016-03-30 LAB — VITAMIN D 25 HYDROXY (VIT D DEFICIENCY, FRACTURES): VIT D 25 HYDROXY: 35.8 ng/mL (ref 30.0–100.0)

## 2016-03-30 LAB — B12 AND FOLATE PANEL

## 2016-03-30 LAB — SEDIMENTATION RATE: Sed Rate: 7 mm/hr (ref 0–30)

## 2016-03-30 LAB — TSH: TSH: 4.76 u[IU]/mL — ABNORMAL HIGH (ref 0.450–4.500)

## 2016-03-30 LAB — HGB A1C W/O EAG: HEMOGLOBIN A1C: 5 % (ref 4.8–5.6)

## 2016-04-04 ENCOUNTER — Encounter: Payer: Self-pay | Admitting: Internal Medicine

## 2016-04-04 ENCOUNTER — Other Ambulatory Visit: Payer: Self-pay | Admitting: Internal Medicine

## 2016-04-04 DIAGNOSIS — E039 Hypothyroidism, unspecified: Secondary | ICD-10-CM | POA: Insufficient documentation

## 2016-04-04 MED ORDER — LEVOTHYROXINE SODIUM 25 MCG PO TABS: 25.0000 ug | ORAL_TABLET | Freq: Every day | ORAL | Status: DC

## 2016-04-07 ENCOUNTER — Telehealth: Payer: Self-pay | Admitting: Internal Medicine

## 2016-04-07 MED ORDER — DICLOFENAC SODIUM 75 MG PO TBEC: 75.0000 mg | DELAYED_RELEASE_TABLET | Freq: Two times a day (BID) | ORAL | Status: DC

## 2016-04-07 MED ORDER — OMEPRAZOLE 40 MG PO CPDR: 40.0000 mg | DELAYED_RELEASE_CAPSULE | Freq: Every day | ORAL | Status: DC

## 2016-04-07 NOTE — Telephone Encounter (Signed)
Left message for patient to return call to office. 

## 2016-04-07 NOTE — Telephone Encounter (Signed)
Patient insurance has denied Celebrex twice due to not trying a previous insaid.

## 2016-04-07 NOTE — Telephone Encounter (Signed)
[facility]  [facility] alternate.  He will need to take omeprazole daily with it to protect stomach.  Both rx sent

## 2016-04-12 NOTE — Telephone Encounter (Signed)
Patient wife called and stated that her husband was to receive a Rx for thyroid, however the Rx was not at the pharmacy Pt contact [phone]

## 2016-04-12 NOTE — Telephone Encounter (Signed)
Attempted to reach, wife, left a VM to clarify on prescription. thanks

## 2016-04-14 NOTE — Telephone Encounter (Signed)
Left message on patient DPR phone to call office and confirm receipt of medication.

## 2016-04-14 NOTE — Telephone Encounter (Signed)
Attempted to call a second time, left a message.  Looks like it was filled on the 25th last month. thanks

## 2016-04-15 MED ORDER — LEVOTHYROXINE SODIUM 25 MCG PO TABS
25.0000 ug | ORAL_TABLET | Freq: Every day | ORAL | Status: DC
Start: 2016-04-15 — End: 2016-05-11

## 2016-04-15 NOTE — Telephone Encounter (Signed)
[facility] until patient can receive supply from [facility]. DPR notified and aware of medication changes.

## 2016-04-15 NOTE — Addendum Note (Signed)
Addended by: Henrene PastorAVIS, KATHY R on: 04/15/2016 09:30 AM   Modules accepted: Orders

## 2016-04-16 ENCOUNTER — Ambulatory Visit: Payer: 59 | Admitting: Cardiovascular Disease

## 2016-04-27 ENCOUNTER — Other Ambulatory Visit: Payer: Self-pay | Admitting: Family Medicine

## 2016-04-27 ENCOUNTER — Other Ambulatory Visit: Payer: Self-pay | Admitting: Internal Medicine

## 2016-05-11 ENCOUNTER — Other Ambulatory Visit: Payer: Self-pay | Admitting: Cardiovascular Disease

## 2016-05-11 ENCOUNTER — Telehealth: Payer: Self-pay | Admitting: Internal Medicine

## 2016-05-11 MED ORDER — LEVOTHYROXINE SODIUM 25 MCG PO TABS: 25.0000 ug | ORAL_TABLET | Freq: Every day | ORAL | 0 refills | Status: DC

## 2016-05-11 NOTE — Telephone Encounter (Signed)
Pt wife called wanting to know if Dr Darrick Huntsman wants pt to continue to take the levothyroxine (SYNTHROID, LEVOTHROID) 25 MCG tablet ? If so pt would need another Rx.   [id number] [place], [place] - [address]  Call wife @ [phone]. Thank you!

## 2016-05-11 NOTE — Telephone Encounter (Signed)
[facility], attempted to reach wife to notify

## 2016-06-02 ENCOUNTER — Other Ambulatory Visit: Payer: Self-pay | Admitting: Internal Medicine

## 2016-06-22 ENCOUNTER — Ambulatory Visit (INDEPENDENT_AMBULATORY_CARE_PROVIDER_SITE_OTHER): Payer: 59 | Admitting: Internal Medicine

## 2016-06-22 ENCOUNTER — Encounter: Payer: Self-pay | Admitting: Internal Medicine

## 2016-06-22 VITALS — BP 130/72 | HR 69 | Temp 98.0°F | Ht 75.0 in | Wt 370.0 lb

## 2016-06-22 DIAGNOSIS — R600 Localized edema: Secondary | ICD-10-CM

## 2016-06-22 DIAGNOSIS — I872 Venous insufficiency (chronic) (peripheral): Secondary | ICD-10-CM

## 2016-06-22 DIAGNOSIS — R601 Generalized edema: Secondary | ICD-10-CM

## 2016-06-22 DIAGNOSIS — M130 Polyarthritis, unspecified: Secondary | ICD-10-CM

## 2016-06-22 DIAGNOSIS — M064 Inflammatory polyarthropathy: Secondary | ICD-10-CM | POA: Diagnosis not present

## 2016-06-22 MED ORDER — LEVOTHYROXINE SODIUM 25 MCG PO TABS: 25.0000 ug | ORAL_TABLET | Freq: Every day | ORAL | 1 refills | Status: DC

## 2016-06-22 MED ORDER — HYDROCODONE-ACETAMINOPHEN 10-325 MG PO TABS: 1.0000 | ORAL_TABLET | Freq: Four times a day (QID) | ORAL | 0 refills | Status: DC | PRN

## 2016-06-22 NOTE — Patient Instructions (Addendum)
If your legs are too swollen  To put on compression knee highs,  Call to arrange a Wound Care referral for  compression wraps .  [facility] can fit you for stockings .   If you don't feel that the diclofenac is helping manage your pain,  You may stop it , since it may be aggravating the swelling

## 2016-06-22 NOTE — Progress Notes (Signed)
Subjective:  Patient ID: Johnathan [name], male    DOB: December 27, 1955  Age: 60 y.o. MRN: [id number]  CC: The primary encounter diagnosis was Generalized edema. Diagnoses of Chronic venous insufficiency, Bilateral leg edema, and Polyarthritis of multiple sites [facility]) were also pertinent to this visit.  HPI Johnathan [name] presents for follow up on DJD multiple joints, chronic narcotic use,  Morbid obesity, atrial fibrillation complicated by untreated sleep apnea,  Hypertension and hyperlipidemia .   At last visit thyroid was slightly underactive.. found during worrkup for peripheral neuropathy.   Synthroid 25 mcg was prescribed. Felt more clear heaeded on the Synthroid but ran out 2 days ago.    Neurology eval in July with EMG studies positive for generalized sensorimotor polyneuropathy , plus/minus right sided CTS  . Heavy metals screen was negative  As was Immunoglobulin levels and  Electrophoresis  Gabapentin was increased. To 100 mg q 6 hrs and 300 mg at ight additional dose is helping   already taking diclofenac  Has gained 11 lbs since June  Body mass index is 46.25 kg/m.   His knee pain is worse lately.   He is using vicodin 4 times daily , along with diclofenac and gabapentin   Lab Results  Component Value Date   TSH 4.760 (H) 03/29/2016     Outpatient Medications Prior to Visit  Medication Sig Dispense Refill  . amiodarone (PACERONE) 200 MG tablet Take 1 tablet by mouth two  times daily 180 tablet 3  . B Complex Vitamins (B COMPLEX 50 PO) Take by mouth daily.    Marland Kitchen buPROPion (WELLBUTRIN XL) 300 MG 24 hr tablet Take 1 tablet by mouth  daily 90 tablet 3  . Cholecalciferol (VITAMIN D3) 2000 UNITS TABS Take by mouth daily.    . dabigatran (PRADAXA) 150 MG CAPS capsule Take 1 capsule (150 mg total) by mouth 2 (two) times daily. 180 capsule 3  . diclofenac (VOLTAREN) 75 MG EC tablet Take 1 tablet (75 mg total) by mouth 2 (two) times daily. 60 tablet 5  . FERROUS GLUCONATE PO Take by  mouth daily.    . furosemide (LASIX) 40 MG tablet Take 1 tablet (40 mg total) by mouth 2 (two) times daily as needed. 180 tablet 3  . gabapentin (NEURONTIN) 100 MG capsule Take 1 capsule by mouth  every 6 hours 360 capsule 1  . GLUCOSAMINE HCL-MSM PO Take by mouth daily.    Marland Kitchen losartan-hydrochlorothiazide (HYZAAR) 50-12.5 MG tablet Take 1 tablet by mouth  daily 90 tablet 1  . metoprolol succinate (TOPROL-XL) 25 MG 24 hr tablet Take 1 tablet (25 mg total) by mouth daily. 90 tablet 3  . Multiple Vitamin (MULTIVITAMIN) tablet Take 1 tablet by mouth daily.    . nitroGLYCERIN (NITROSTAT) 0.4 MG SL tablet Place 1 tablet (0.4 mg total) under the tongue every 5 (five) minutes as needed for chest pain. 30 tablet 3  . omeprazole (PRILOSEC) 40 MG capsule Take 1 capsule (40 mg total) by mouth daily. 30 capsule 3  . Potassium Chloride ER 20 MEQ TBCR Take 20 mEq by mouth 2 (two) times daily as needed. 180 tablet 3  . propranolol (INDERAL) 20 MG tablet Take 20 mg by mouth 3 (three) times daily as needed. Reported on 03/22/2016    . vitamin B-12 (CYANOCOBALAMIN) 500 MCG tablet Take 500 mcg by mouth daily.    . vitamin E 400 UNIT capsule Take 400 Units by mouth daily.    Marland Kitchen HYDROcodone-acetaminophen (NORCO)  10-325 MG tablet Take 1 tablet by mouth every 6 (six) hours as needed. 120 tablet 0  . HYDROcodone-acetaminophen (NORCO) 10-325 MG tablet Take 1 tablet by mouth every 6 (six) hours as needed. May refill on or after May 25, 2016 120 tablet 0  . HYDROcodone-acetaminophen (NORCO) 10-325 MG tablet Take 1 tablet by mouth every 6 (six) hours as needed. 120 tablet 0  . levothyroxine (SYNTHROID, LEVOTHROID) 25 MCG tablet TAKE 1 TABLET (25 MCG TOTAL) BY MOUTH DAILY BEFORE BREAKFAST. 30 tablet 0   No facility-administered medications prior to visit.     Review of Systems;  Patient denies headache, fevers, malaise, unintentional weight loss, skin rash, eye pain, sinus congestion and sinus pain, sore throat,  dysphagia,  hemoptysis , cough, dyspnea, wheezing, chest pain, palpitations, orthopnea, edema, abdominal pain, nausea, melena, diarrhea, constipation, flank pain, dysuria, hematuria, urinary  Frequency, nocturia, numbness, tingling, seizures,  Focal weakness, Loss of consciousness,  Tremor, insomnia, depression, anxiety, and suicidal ideation.      Objective:  BP 130/72   Pulse 69   Temp 98 F (36.7 C) (Oral)   Ht 6\' 3"  (1.905 m)   Wt (!) 370 lb (167.8 kg)   SpO2 97%   BMI 46.25 kg/m   BP Readings from Last 3 Encounters:  06/22/16 130/72  03/22/16 138/68  12/04/15 (!) 144/82    Wt Readings from Last 3 Encounters:  06/22/16 (!) 370 lb (167.8 kg)  03/22/16 (!) 359 lb 8 oz (163.1 kg)  12/04/15 (!) 356 lb 12.8 oz (161.8 kg)    General appearance: alert, cooperative and appears stated age Ears: normal TM's and external ear canals both ears Throat: lips, mucosa, and tongue normal; teeth and gums normal Neck: no adenopathy, no carotid bruit, supple, symmetrical, trachea midline and thyroid not enlarged, symmetric, no tenderness/mass/nodules Back: symmetric, no curvature. ROM normal. No CVA tenderness. Lungs: clear to auscultation bilaterally Heart: regular rate and rhythm, S1, S2 normal, no murmur, click, rub or gallop Abdomen: soft, non-tender; bowel sounds normal; no masses,  no organomegaly Pulses: 2+ and symmetric Skin: Skin color, texture, turgor normal. No rashes or lesions Lymph nodes: Cervical, supraclavicular, and axillary nodes normal.  Lab Results  Component Value Date   HGBA1C 5.0 03/29/2016    Lab Results  Component Value Date   CREATININE 1.15 03/29/2016   CREATININE 1.01 12/16/2015   CREATININE 0.98 04/08/2015    Lab Results  Component Value Date   WBC 7.1 04/08/2015   HGB 14.9 04/08/2015   HCT 43.0 04/08/2015   PLT 130 (L) 04/08/2015   GLUCOSE 85 03/29/2016   CHOL 151 03/29/2016   TRIG 55 03/29/2016   HDL 51 03/29/2016   LDLCALC 89 03/29/2016    ALT 30 03/29/2016   AST 25 03/29/2016   NA 141 03/29/2016   K 4.2 03/29/2016   CL 103 03/29/2016   CREATININE 1.15 03/29/2016   BUN 23 03/29/2016   CO2 18 03/29/2016   TSH 4.760 (H) 03/29/2016   INR 1.2 07/16/2013   HGBA1C 5.0 03/29/2016    No results found.  Assessment & Plan:   Problem List Items Addressed This Visit    Polyarthritis of multiple sites [facility])    multiple joints , OA aggravated by morbid obesity .  Patient is using vicodin every 6 hours. Patient has not had an escalation of use of narcotics and is reminded not to share medication with others or to combine with alcohol or other sedating medications.  Patient is providing  urine sample upon request per narcotics contract, Refill on narcotic was given today. Refill history confirmed via [place] Controlled Substance databas, accessed by me today..       Bilateral leg edema    Multifactorial, with systolic dysfunction, untreated OSA,  and VI. unable to wear compression stockings.  If he cannot wear stockings at this point in time,  Will refer to Wound Center for compression stockings           Chronic venous insufficiency    Aggravated by use of gabapentin.  Advised to wear conpression stockings        Other Visit Diagnoses    Generalized edema    -  Primary   Relevant Orders   For home use only DME Other see comment     A total of 25 minutes of face to face time was spent with patient more than half of which was spent in counselling about the above mentioned conditions  and coordination of care  I have changed Mr. Barnabas HarriesRandell's HYDROcodone-acetaminophen. I am also having him maintain his FERROUS GLUCONATE PO, multivitamin, B Complex Vitamins (B COMPLEX 50 PO), vitamin B-12, Vitamin D3, GLUCOSAMINE HCL-MSM PO, vitamin E, nitroGLYCERIN, dabigatran, furosemide, Potassium Chloride ER, propranolol, metoprolol succinate, losartan-hydrochlorothiazide, diclofenac, omeprazole, gabapentin, buPROPion, amiodarone, levothyroxine,  HYDROcodone-acetaminophen, and HYDROcodone-acetaminophen.  Meds ordered this encounter  Medications  . levothyroxine (SYNTHROID, LEVOTHROID) 25 MCG tablet    Sig: Take 1 tablet (25 mcg total) by mouth daily before breakfast.    Dispense:  90 tablet    Refill:  1  . HYDROcodone-acetaminophen (NORCO) 10-325 MG tablet    Sig: Take 1 tablet by mouth every 6 (six) hours as needed.    Dispense:  120 tablet    Refill:  0    May refill on or After August 25 2016  . HYDROcodone-acetaminophen (NORCO) 10-325 MG tablet    Sig: Take 1 tablet by mouth every 6 (six) hours as needed. May refill on or after July 25, 2016    Dispense:  120 tablet    Refill:  0  . HYDROcodone-acetaminophen (NORCO) 10-325 MG tablet    Sig: Take 1 tablet by mouth every 6 (six) hours as needed.    Dispense:  120 tablet    Refill:  0    May refill on or after June 25, 2016    Medications Discontinued During This Encounter  Medication Reason  . levothyroxine (SYNTHROID, LEVOTHROID) 25 MCG tablet Reorder  . HYDROcodone-acetaminophen (NORCO) 10-325 MG tablet Reorder  . HYDROcodone-acetaminophen (NORCO) 10-325 MG tablet Reorder  . HYDROcodone-acetaminophen (NORCO) 10-325 MG tablet Reorder    Follow-up: Return in about 3 months (around 09/21/2016) for fasting labs prior .   Sherlene ShamsULLO, TERESA L, MD

## 2016-06-23 DIAGNOSIS — I872 Venous insufficiency (chronic) (peripheral): Secondary | ICD-10-CM | POA: Insufficient documentation

## 2016-06-23 NOTE — Assessment & Plan Note (Signed)
multiple joints , OA aggravated by morbid obesity .  Patient is using vicodin every 6 hours. Patient has not had an escalation of use of narcotics and is reminded not to share medication with others or to combine with alcohol or other sedating medications.  Patient is providing urine sample upon request per narcotics contract, Refill on narcotic was given today. Refill history confirmed via [place] Controlled Substance databas, accessed by me today..Marland Kitchen

## 2016-06-23 NOTE — Assessment & Plan Note (Signed)
Aggravated by use of gabapentin.  Advised to wear conpression stockings

## 2016-06-23 NOTE — Assessment & Plan Note (Signed)
Multifactorial, with systolic dysfunction, untreated OSA,  and VI. unable to wear compression stockings.  If he cannot wear stockings at this point in time,  Will refer to Wound Center for compression stockings

## 2016-06-27 ENCOUNTER — Other Ambulatory Visit: Payer: Self-pay | Admitting: Cardiovascular Disease

## 2016-07-01 ENCOUNTER — Encounter: Payer: Self-pay | Admitting: Internal Medicine

## 2016-07-01 ENCOUNTER — Telehealth: Payer: Self-pay | Admitting: Internal Medicine

## 2016-07-01 NOTE — Telephone Encounter (Signed)
Pt needs to get fasting labs. Need orders please and thank you!

## 2016-07-01 NOTE — Telephone Encounter (Signed)
None needed ;   Only TSH ant end of october. mychart message sent

## 2016-08-02 ENCOUNTER — Ambulatory Visit (INDEPENDENT_AMBULATORY_CARE_PROVIDER_SITE_OTHER): Payer: 59 | Admitting: Family Medicine

## 2016-08-02 ENCOUNTER — Ambulatory Visit: Payer: Self-pay

## 2016-08-02 ENCOUNTER — Encounter: Payer: Self-pay | Admitting: Family Medicine

## 2016-08-02 VITALS — BP 144/84 | HR 52 | Wt 371.0 lb

## 2016-08-02 DIAGNOSIS — M25561 Pain in right knee: Principal | ICD-10-CM

## 2016-08-02 DIAGNOSIS — G629 Polyneuropathy, unspecified: Secondary | ICD-10-CM

## 2016-08-02 DIAGNOSIS — G8929 Other chronic pain: Secondary | ICD-10-CM | POA: Diagnosis not present

## 2016-08-02 DIAGNOSIS — M1711 Unilateral primary osteoarthritis, right knee: Secondary | ICD-10-CM | POA: Diagnosis not present

## 2016-08-02 DIAGNOSIS — Z01818 Encounter for other preprocedural examination: Secondary | ICD-10-CM | POA: Insufficient documentation

## 2016-08-02 MED ORDER — BUPROPION HCL ER (XL) 150 MG PO TB24: 150.0000 mg | ORAL_TABLET | Freq: Every day | ORAL | 3 refills | Status: DC

## 2016-08-02 MED ORDER — VENLAFAXINE HCL ER 37.5 MG PO CP24: 37.5000 mg | ORAL_CAPSULE | Freq: Every day | ORAL | 1 refills | Status: DC

## 2016-08-02 NOTE — Progress Notes (Addendum)
Tawana ScaleZach Smith D.O. [facility] [address] [place], [place] [address] Phone: [phone] Subjective:     CC: Right knee pain BJY:[id number]:Subjective  Johnathan [name] is a 60 y.o. male coming in with complaint of right knee pain. Patient has had a left knee replacement. States that the pain is severe at this time. Having increasing instability. Been going on for the last 2 weeks. Known arthritic changes. Patient does not remember any true injury. States that it is affecting daily activities and waking him up at night. Does not want to have surgery yet but no cerebral needed at some point.  Patient is also complaining of hand and foot pain. Has been diagnosed with the peripheral neuropathy. neurologist but unable to tolerate increasing dose of gabapentin. also because the patient's comorbidities and multiple other medications can be very hard. continues on chronic pain medications at this time.  Past Medical History:  Diagnosis Date  . Arthritis    a. chronic joint pain  . B12 deficiency   . Chronic systolic CHF (congestive heart failure) (HCC)    a. echo 2013: EF of 50-55%, normal right ventricular systolic pressure, normal left atrium; b. EF 40-45%, inadequate for LV wall motion, mild MR, LA severely dilated @ 52 mm, PASP nl  . History of DVT (deep vein thrombosis) 2004   s/p bariatric surgery  . History of stress test    a. there was no ST segment deviation noted during stress,    There is a small defect of moderate severity present in the apex location, suggestive of apical ischemia, intermediate risk, calculated EF 21% but visually appears to be 35-40%  . Hypertension   . Obesity   . OSA (obstructive sleep apnea)    a. not compliant with CPAP  . PAF (paroxysmal atrial fibrillation) (HCC)    a. CHADSVASc at least 2 (HTN and vascular disease); b. on Eliquis    Past Surgical History:  Procedure Laterality Date  . ARTHROSCOPIC REPAIR ACL  1993   bilateral knee  . BARIATRIC  SURGERY  2004   Rou en Y   . BARIATRIC SURGERY    . CHOLECYSTECTOMY  1982  . ELECTROPHYSIOLOGIC STUDY N/A 06/20/2015   Procedure: CARDIOVERSION;  Surgeon: Antonieta Ibaimothy J Gollan, MD;  Location: [facility] ORS;  Service: Cardiovascular;  Laterality: N/A;  . MENISCUS DEBRIDEMENT  2011   Left knee  . REPLACEMENT TOTAL KNEE Left    Social History  Substance Use Topics  . Smoking status: Never Smoker  . Smokeless tobacco: Never Used  . Alcohol use 1.2 oz/week    2 Shots of liquor per week     Comment: Vodka and lemonade occasionally 2-3/week    No Known Allergies Family History  Problem Relation Age of Onset  . Family history unknown: Yes       Review of Systems: No headache, visual changes, nausea, vomiting, diarrhea, constipation, dizziness, abdominal pain, skin rash, fevers, chills, night sweats, weight loss, swollen lymph nodes,   Objective  Blood pressure (!) 144/84, pulse (!) 52, weight (!) 371 lb (168.3 kg), SpO2 94 %.  General: No apparent distress alert and oriented x3 mood and affect normal, dressed appropriately. Obese, morbid.  HEENT: Pupils equal, extraocular movements intact  Respiratory: Patient's speak in full sentences and does not appear short of breath  Cardiovascular: No lower extremity edema, non tender, no erythema  Skin: Warm dry intact with no signs of infection or rash on extremities or on axial skeleton.  Abdomen:  Soft nontender  Neuro: Cranial nerves II through XII are intact, neurovascularly intact in all extremities with 2+ DTRs and 2+ pulses.  Lymph: No lymphadenopathy of posterior or anterior cervical chain or axillae bilaterally.  Gait antalgic gait noted.  MSK:  Non tender with full range of motion and good stability and symmetric strength and tone of  elbows, wrist, hip, and ankles bilaterally. Significant arthritic changes of multiple joints Knee: Right Severe arthritic changes with chronic subluxation of the patella Large thigh to calf ratio Diffuse  tenderness to palpation ROM full in flexion and extension and lower leg rotation. Significant valgus deformity with significant instability with valgus force Patellar glide severe crepitus. Patellar and quadriceps tendons unremarkable. Hamstring and quadriceps strength is normal.  Contralateral knee total replacement  Procedure: Real-time Ultrasound Guided Injection of right knee Device: GE Logiq E  Ultrasound guided injection is preferred based studies that show increased duration, increased effect, greater accuracy, decreased procedural pain, increased response rate, and decreased cost with ultrasound guided versus blind injection.  Verbal informed consent obtained.  Time-out conducted.  Noted no overlying erythema, induration, or other signs of local infection.  Skin prepped in a sterile fashion.  Local anesthesia: Topical Ethyl chloride.  With sterile technique and under real time ultrasound guidance: With a 22-gauge 2 inch needle patient was injected with 4 cc of 0.5% Marcaine and 1 cc of Kenalog 40 mg/dL. This was from a superior lateral approach.  Completed without difficulty  Pain immediately resolved suggesting accurate placement of the medication.  Advised to call if fevers/chills, erythema, induration, drainage, or persistent bleeding.  Images permanently stored and available for review in the ultrasound unit.  Impression: Technically successful ultrasound guided injection.    Impression and Recommendations:     This case required medical decision making of moderate complexity.

## 2016-08-02 NOTE — Assessment & Plan Note (Signed)
Given injection today and tolerated the procedure well. We discussed icing regimen and home exercises. We discussed which activities to do an which was to avoid. Patient will be fitted for custom brace secondary to the instability as well as patient's thigh to calf ratio. Given topical anti-inflammatories and will avoid oral secondary to patient being on oral anti-coagulant. Follow-up again in 3-4 weeks. Could be a candidate for viscous supplementation

## 2016-08-02 NOTE — Assessment & Plan Note (Signed)
Worsening neuropathy overall. 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 Discussed with patient at great length. Patient like to make some changes. We'll have patient decreases Wellbutrin and started on Effexor. We'll monitor heart rate. Patient is following up with his heart surgeon in the near future. Patient come back and see me again in 3-4 weeks.

## 2016-08-02 NOTE — Patient Instructions (Signed)
Good to see you  Ice is your friend pennsaid pinkie amount topically 2 times daily as needed.  Wear good shoes We will get brace Read pamphlet See me again in 3 weeks

## 2016-08-10 ENCOUNTER — Ambulatory Visit (INDEPENDENT_AMBULATORY_CARE_PROVIDER_SITE_OTHER): Payer: 59 | Admitting: Cardiovascular Disease

## 2016-08-10 ENCOUNTER — Encounter: Payer: Self-pay | Admitting: Cardiovascular Disease

## 2016-08-10 VITALS — BP 140/72 | HR 57 | Ht 75.0 in | Wt 369.1 lb

## 2016-08-10 DIAGNOSIS — I10 Essential (primary) hypertension: Secondary | ICD-10-CM | POA: Diagnosis not present

## 2016-08-10 DIAGNOSIS — I4891 Unspecified atrial fibrillation: Secondary | ICD-10-CM | POA: Diagnosis not present

## 2016-08-10 DIAGNOSIS — I5033 Acute on chronic diastolic (congestive) heart failure: Secondary | ICD-10-CM

## 2016-08-10 MED ORDER — DABIGATRAN ETEXILATE MESYLATE 150 MG PO CAPS: 150.0000 mg | ORAL_CAPSULE | Freq: Two times a day (BID) | ORAL | 11 refills | Status: DC

## 2016-08-10 NOTE — Progress Notes (Signed)
Cardiology Office Note  Date:  08/10/2016   ID:  Johnathan [name], DOB [birth year], MRN [id number]  PCP:  Johnathan Shams, MD   Chief Complaint  Patient presents with  . Atrial Fibrillation  . Hypertension    HPI:  [name] is a pleasant 60 year old gentleman with obesity, chronic joint pain of the shoulders and knees on long-term low-dose narcotics, hypertension, history of bariatric surgery, atrial fibrillation in August 2013, normal ejection fraction at that time who presents for follow-up of his atrial fibrillation. History of sleep apnea, currently not on his CPAP   in follow-up today, He reports that he has been doing well Reports blood pressure typically well controlled, maintaining normal sinus rhythm Metoprolol previously dropped down and dose for bradycardia Denies any palpitations concerning for atrial fibrillation Tolerating anticoagulation Weight continues to trend upwards, no regular exercise program   EKG on today's visit shows normal sinus rhythm with rate 54 bpm, APCs, no significant ST or T-wave changes   other past medical history  previous cardioversion,successful in restoring normal sinus rhythm   tick bite in June 2016, he developed atrial fibrillation initially with RVR. Started on digoxin, continued on metoprolol and diltiazem with improved heart rate. Echocardiogram and stress test at that time Cardiac exam shows slightly depressed ejection fraction, dilated left atrium, mild LVH. Ejection fraction estimated at 40-45%. Stress test with small region of apical perfusion defect, depressed ejection fraction.  Previously reported having tachycardia when he feels hot, improved when he cools down Previously had significant shoulder pain Prior clinic visit with shortness of breath that improved with Lasix  In the hospital 05/31/2012 with left-sided chest pain and palpitations. He was given adenosine in the emergency room that showed atrial fibrillation.  He received Cardizem and started on diltiazem infusion and converted to normal sinus rhythm overnight. He was started on anticoagulation, continued on long-acting Cardizem and was discharged after echocardiogram showed normal LV function.  Echocardiogram showed normal LV function with ejection fraction 50-55%, normal right ventricular systolic pressure, normal left atrium TSH 1.7  Event monitor was performed September 16 2 07/25/2012 that showed normal sinus rhythm with PVCs, no significant atrial fibrillation Blood pressure at home has been labile, typically in the 130-140 systolic range.    PMH:   has a past medical history of Arthritis; B12 deficiency; Chronic systolic CHF (congestive heart failure) (HCC); History of DVT (deep vein thrombosis) (2004); History of stress test; Hypertension; Obesity; OSA (obstructive sleep apnea); and PAF (paroxysmal atrial fibrillation) (HCC).  PSH:    Past Surgical History:  Procedure Laterality Date  . ARTHROSCOPIC REPAIR ACL  1993   bilateral knee  . BARIATRIC SURGERY  2004   Rou en Y   . BARIATRIC SURGERY    . CHOLECYSTECTOMY  1982  . ELECTROPHYSIOLOGIC STUDY N/A 06/20/2015   Procedure: CARDIOVERSION;  Surgeon: Antonieta Iba, MD;  Location: [facility] ORS;  Service: Cardiovascular;  Laterality: N/A;  . MENISCUS DEBRIDEMENT  2011   Left knee  . REPLACEMENT TOTAL KNEE Left     Current Outpatient Prescriptions  Medication Sig Dispense Refill  . amiodarone (PACERONE) 200 MG tablet Take 1 tablet by mouth two  times daily 180 tablet 3  . B Complex Vitamins (B COMPLEX 50 PO) Take 1 tablet by mouth daily.     Marland Kitchen buPROPion (WELLBUTRIN XL) 150 MG 24 hr tablet Take 1 tablet (150 mg total) by mouth daily. 30 tablet 3  . Cholecalciferol (VITAMIN D3) 2000 UNITS TABS Take 1 tablet  by mouth daily.     . dabigatran (PRADAXA) 150 MG CAPS capsule Take 1 capsule (150 mg total) by mouth 2 (two) times daily. 60 capsule 11  . diclofenac (VOLTAREN) 75 MG EC tablet Take 1  tablet (75 mg total) by mouth 2 (two) times daily. 60 tablet 5  . FERROUS GLUCONATE PO Take by mouth daily.    . furosemide (LASIX) 40 MG tablet Take 1 tablet (40 mg total) by mouth 2 (two) times daily as needed. 180 tablet 3  . gabapentin (NEURONTIN) 100 MG capsule Take 1 capsule by mouth  every 6 hours 360 capsule 1  . GLUCOSAMINE HCL-MSM PO Take 1 tablet by mouth daily.     Marland Kitchen. HYDROcodone-acetaminophen (NORCO) 10-325 MG tablet Take 1 tablet by mouth every 6 (six) hours as needed. 120 tablet 0  Reason discussed various . levothyroxine (SYNTHROID, LEVOTHROID) 25 MCG tablet Take 1 tablet (25 mcg total) by mouth daily before breakfast. 90 tablet 1  . losartan-hydrochlorothiazide (HYZAAR) 50-12.5 MG tablet Take 1 tablet by mouth  daily 90 tablet 1  . metoprolol succinate (TOPROL-XL) 25 MG 24 hr tablet Take 1 tablet (25 mg total) by mouth daily. 90 tablet 3  . Multiple Vitamin (MULTIVITAMIN) tablet Take 1 tablet by mouth daily.    . nitroGLYCERIN (NITROSTAT) 0.4 MG SL tablet Place 1 tablet (0.4 mg total) under the tongue every 5 (five) minutes as needed for chest pain. 30 tablet 3  . Potassium Chloride ER 20 MEQ TBCR Take 20 mEq by mouth 2 (two) times daily as needed. 180 tablet 3  . propranolol (INDERAL) 20 MG tablet Take 20 mg by mouth 3 (three) times daily as needed. Reported on 03/22/2016    . venlafaxine XR (EFFEXOR XR) 37.5 MG 24 hr capsule Take 1 capsule (37.5 mg total) by mouth daily with breakfast. 30 capsule 1  . vitamin B-12 (CYANOCOBALAMIN) 500 MCG tablet Take 500 mcg by mouth daily.    . vitamin E 400 UNIT capsule Take 400 Units by mouth daily.     No current facility-administered medications for this visit.      Allergies:   Review of patient's allergies indicates no known allergies.   Social History:  The patient  reports that he has never smoked. He has never used smokeless tobacco. He reports that he drinks about 1.2 oz of alcohol per week . He reports that he does not use drugs.    Family History:   Family history is unknown by patient.    Review of Systems: Review of Systems  Constitutional: Negative.   Respiratory: Negative.   Cardiovascular: Negative.   Gastrointestinal: Negative.   Musculoskeletal: Negative.   Neurological: Negative.   Psychiatric/Behavioral: Negative.   All other systems reviewed and are negative.    PHYSICAL EXAM: VS:  BP 140/72   Pulse (!) 57   Ht 6\' 3"  (1.905 m)   Wt (!) 369 lb 1.9 oz (167.4 kg)   SpO2 96%   BMI 46.14 kg/m  , BMI Body mass index is 46.14 kg/m. GEN: Well nourished, well developed, in no acute distress, obese  HEENT: normal  Neck: no JVD, carotid bruits, or masses Cardiac: RRR; no murmurs, rubs, or gallops,no edema  Respiratory:  clear to auscultation bilaterally, normal work of breathing GI: soft, nontender, nondistended, + BS MS: no deformity or atrophy  Skin: warm and dry, no rash Neuro:  Strength and sensation are intact Psych: euthymic mood, full affect    Recent Labs: 03/29/2016: ALT  30; BUN 23; Creatinine, Ser 1.15; Potassium 4.2; Sodium 141; TSH 4.760    Lipid Panel Lab Results  Component Value Date   CHOL 151 03/29/2016   HDL 51 03/29/2016   LDLCALC 89 03/29/2016   TRIG 55 03/29/2016      Wt Readings from Last 3 Encounters:  08/10/16 (!) 369 lb 1.9 oz (167.4 kg)  08/02/16 (!) 371 lb (168.3 kg)  06/22/16 (!) 370 lb (167.8 kg)       ASSESSMENT AND PLAN:  Atrial fibrillation, unspecified type (HCC) - Plan: EKG 12-Lead Maintaining normal sinus rhythm TSH up to greater than 4. On amiodarone. We'll need to monitor Recently started on thyroid supplement medication  Essential hypertension - Plan: EKG 12-Lead Blood pressure is well controlled on today's visit. No changes made to the medications.  Acute on chronic diastolic CHF (congestive heart failure) (HCC) Appears relatively euvolemic  Severe obesity (BMI >= 40) (HCC) We Have discussed various strategies for weight  loss Unable to exercise secondary to chronic knee pain    Total encounter time more than 25 minutes  Greater than 50% was spent in counseling and coordination of care with the patient   Disposition:   F/U  12 months   Orders Placed This Encounter  Procedures  . EKG 12-Lead     Signed, Dossie Arbourim Jock Mahon, M.D., Ph.D. 08/10/2016  [facility], [place] [phone]

## 2016-08-10 NOTE — Patient Instructions (Signed)

## 2016-08-25 ENCOUNTER — Ambulatory Visit: Payer: 59 | Admitting: Family Medicine

## 2016-09-11 ENCOUNTER — Other Ambulatory Visit: Payer: Self-pay | Admitting: Internal Medicine

## 2016-09-13 ENCOUNTER — Telehealth: Payer: Self-pay | Admitting: Internal Medicine

## 2016-09-13 DIAGNOSIS — E02 Subclinical iodine-deficiency hypothyroidism: Secondary | ICD-10-CM

## 2016-09-13 DIAGNOSIS — I10 Essential (primary) hypertension: Secondary | ICD-10-CM

## 2016-09-13 DIAGNOSIS — E538 Deficiency of other specified B group vitamins: Secondary | ICD-10-CM

## 2016-09-13 DIAGNOSIS — E559 Vitamin D deficiency, unspecified: Secondary | ICD-10-CM

## 2016-09-13 DIAGNOSIS — E7849 Other hyperlipidemia: Secondary | ICD-10-CM

## 2016-09-13 DIAGNOSIS — Z79899 Other long term (current) drug therapy: Secondary | ICD-10-CM

## 2016-09-13 NOTE — Telephone Encounter (Signed)
Patient notified.,

## 2016-09-13 NOTE — Telephone Encounter (Signed)
Signed, Thanks.

## 2016-09-13 NOTE — Telephone Encounter (Signed)
Pt wife Olegario MessierKathy called and asked if we could put in lab orders for [facility] before his appt on 12/13. Please advise, thank you !  [facility] @ [phone]

## 2016-09-13 NOTE — Telephone Encounter (Signed)
I have pended the orders for [facility] any thing else?

## 2016-09-14 NOTE — Telephone Encounter (Signed)
Orders faxed and patient notified.

## 2016-09-14 NOTE — Telephone Encounter (Signed)
Pt spouse called and stated that [facility] does not have the orders. Can we fax them over. Please advise, thank you!  Fax # [phone]  Call [facility] @ [phone]

## 2016-09-15 ENCOUNTER — Ambulatory Visit: Payer: 59 | Admitting: Family Medicine

## 2016-09-15 ENCOUNTER — Other Ambulatory Visit: Payer: Self-pay | Admitting: Internal Medicine

## 2016-09-16 LAB — COMPREHENSIVE METABOLIC PANEL
ALK PHOS: 100 IU/L (ref 39–117)
ALT: 29 IU/L (ref 0–44)
AST: 26 IU/L (ref 0–40)
Albumin/Globulin Ratio: 1.5 (ref 1.2–2.2)
Albumin: 4.4 g/dL (ref 3.6–4.8)
BUN/Creatinine Ratio: 26 — ABNORMAL HIGH (ref 10–24)
BUN: 24 mg/dL (ref 8–27)
Bilirubin Total: 0.4 mg/dL (ref 0.0–1.2)
CO2: 20 mmol/L (ref 18–29)
CREATININE: 0.94 mg/dL (ref 0.76–1.27)
Calcium: 9 mg/dL (ref 8.6–10.2)
Chloride: 101 mmol/L (ref 96–106)
GFR calc Af Amer: 101 mL/min/{1.73_m2} (ref >59)
GFR calc non Af Amer: 87 mL/min/{1.73_m2} (ref >59)
GLOBULIN, TOTAL: 2.9 g/dL (ref 1.5–4.5)
GLUCOSE: 91 mg/dL (ref 65–99)
Potassium: 4.4 mmol/L (ref 3.5–5.2)
SODIUM: 143 mmol/L (ref 134–144)
Total Protein: 7.3 g/dL (ref 6.0–8.5)

## 2016-09-16 LAB — CBC WITH DIFFERENTIAL/PLATELET
BASOS ABS: 0 10*3/uL (ref 0.0–0.2)
Basos: 0 %
EOS (ABSOLUTE): 0.2 10*3/uL (ref 0.0–0.4)
Eos: 3 %
Hematocrit: 43 % (ref 37.5–51.0)
Hemoglobin: 14.6 g/dL (ref 13.0–17.7)
Immature Grans (Abs): 0 10*3/uL (ref 0.0–0.1)
Immature Granulocytes: 0 %
LYMPHS ABS: 1.9 10*3/uL (ref 0.7–3.1)
Lymphs: 29 %
MCH: 31 pg (ref 26.6–33.0)
MCHC: 34 g/dL (ref 31.5–35.7)
MCV: 91 fL (ref 79–97)
MONOS ABS: 0.7 10*3/uL (ref 0.1–0.9)
Monocytes: 10 %
Neutrophils Absolute: 3.9 10*3/uL (ref 1.4–7.0)
Neutrophils: 58 %
Platelets: 149 10*3/uL — ABNORMAL LOW (ref 150–379)
RBC: 4.71 x10E6/uL (ref 4.14–5.80)
RDW: 13.9 % (ref 12.3–15.4)
WBC: 6.7 10*3/uL (ref 3.4–10.8)

## 2016-09-16 LAB — LIPID PANEL
CHOLESTEROL TOTAL: 183 mg/dL (ref 100–199)
Chol/HDL Ratio: 3.5 ratio units (ref 0.0–5.0)
HDL: 53 mg/dL (ref >39)
LDL CALC: 116 mg/dL — AB (ref 0–99)
TRIGLYCERIDES: 70 mg/dL (ref 0–149)
VLDL Cholesterol Cal: 14 mg/dL (ref 5–40)

## 2016-09-16 LAB — VITAMIN D 25 HYDROXY (VIT D DEFICIENCY, FRACTURES): Vit D, 25-Hydroxy: 36.8 ng/mL (ref 30.0–100.0)

## 2016-09-16 LAB — VITAMIN B12: Vitamin B-12: 780 pg/mL (ref 232–1245)

## 2016-09-16 LAB — TSH: TSH: 3.11 u[IU]/mL (ref 0.450–4.500)

## 2016-09-22 ENCOUNTER — Encounter: Payer: Self-pay | Admitting: Internal Medicine

## 2016-09-22 ENCOUNTER — Ambulatory Visit (INDEPENDENT_AMBULATORY_CARE_PROVIDER_SITE_OTHER): Payer: 59 | Admitting: Internal Medicine

## 2016-09-22 DIAGNOSIS — I10 Essential (primary) hypertension: Secondary | ICD-10-CM

## 2016-09-22 DIAGNOSIS — M1711 Unilateral primary osteoarthritis, right knee: Secondary | ICD-10-CM

## 2016-09-22 MED ORDER — HYDROCODONE-ACETAMINOPHEN 10-325 MG PO TABS: 1.0000 | ORAL_TABLET | Freq: Four times a day (QID) | ORAL | 0 refills | Status: DC | PRN

## 2016-09-22 NOTE — Progress Notes (Signed)
Pre-visit discussion using our clinic review tool. No additional management support is needed unless otherwise documented below in the visit note.  

## 2016-09-22 NOTE — Progress Notes (Signed)
Subjective:  Patient ID: Johnathan DawesEdward A [name], male    DOB: [birth year]  Age: 60 y.o. MRN: [id number]  CC: Diagnoses of Primary osteoarthritis of right knee and Essential hypertension were pertinent to this visit.  HPI Johnathan Dawesdward A Gerry presents for 3 month follow up on chronic pain secondary to DJD knees and peripheral neuropathy, complicated by morbid obesity  And chronic venous stasis complicated by untreated OSA   Saw Dr Katrinka BlazingSmith for worsening right knee, diagnosed with a  dislocated knee cap.  Has follow up  next Tuesday for fitting of a brace.  May have been precipitated by a fall while crossing  the street and tripping on the curb, but his occupational activities also involve recurrent pivoting and twisting at the knee level .  Neuropathy: wellbutrin dose reduced by Dr. Sherryll BurgerShah and he added effexor for neuropathy this change has resulted in an improvement in his pain control,  mostly at night   Some constiption , managed with stool softner   Now on 500 mg colace.  Moving once daily  Edema secondary to venous stasis and untreated OSA; using lasix more often on the weekends   Chronic pain, managed with narcotics: he has not had any ER visits  And has not requested any early refills.  Her Refill history was confirmed via [place] Controlled Substance database by me today during her visit and there have been no prescriptions of controlled substances filled from any providers other than me. .   Outpatient Medications Prior to Visit  Medication Sig Dispense Refill  . amiodarone (PACERONE) 200 MG tablet Take 1 tablet by mouth two  times daily 180 tablet 3  . B Complex Vitamins (B COMPLEX 50 PO) Take 1 tablet by mouth daily.     Marland Kitchen. buPROPion (WELLBUTRIN XL) 150 MG 24 hr tablet Take 1 tablet (150 mg total) by mouth daily. 30 tablet 3  . Cholecalciferol (VITAMIN D3) 2000 UNITS TABS Take 1 tablet by mouth daily.     . dabigatran (PRADAXA) 150 MG CAPS capsule Take 1 capsule (150 mg total) by mouth 2 (two) times  daily. 60 capsule 11  . diclofenac (VOLTAREN) 75 MG EC tablet Take 1 tablet (75 mg total) by mouth 2 (two) times daily. 60 tablet 5  . FERROUS GLUCONATE PO Take by mouth daily.    . furosemide (LASIX) 40 MG tablet Take 1 tablet (40 mg total) by mouth 2 (two) times daily as needed. 180 tablet 3  . gabapentin (NEURONTIN) 100 MG capsule Take 1 capsule by mouth  every 6 hours 360 capsule 1  . GLUCOSAMINE HCL-MSM PO Take 1 tablet by mouth daily.     Marland Kitchen. levothyroxine (SYNTHROID, LEVOTHROID) 25 MCG tablet Take 1 tablet (25 mcg total) by mouth daily before breakfast. 90 tablet 1  . metoprolol succinate (TOPROL-XL) 25 MG 24 hr tablet Take 1 tablet (25 mg total) by mouth daily. 90 tablet 3  . Multiple Vitamin (MULTIVITAMIN) tablet Take 1 tablet by mouth daily.    . nitroGLYCERIN (NITROSTAT) 0.4 MG SL tablet Place 1 tablet (0.4 mg total) under the tongue every 5 (five) minutes as needed for chest pain. 30 tablet 3  . Potassium Chloride ER 20 MEQ TBCR Take 20 mEq by mouth 2 (two) times daily as needed. 180 tablet 3  . propranolol (INDERAL) 20 MG tablet Take 20 mg by mouth 3 (three) times daily as needed. Reported on 03/22/2016    . venlafaxine XR (EFFEXOR XR) 37.5 MG 24 hr capsule Take  1 capsule (37.5 mg total) by mouth daily with breakfast. 30 capsule 1  . vitamin B-12 (CYANOCOBALAMIN) 500 MCG tablet Take 500 mcg by mouth daily.    . vitamin E 400 UNIT capsule Take 400 Units by mouth daily.    Marland Kitchen. HYDROcodone-acetaminophen (NORCO) 10-325 MG tablet Take 1 tablet by mouth every 6 (six) hours as needed. 120 tablet 0  . HYDROcodone-acetaminophen (NORCO) 10-325 MG tablet Take 1 tablet by mouth every 6 (six) hours as needed. May refill on or after July 25, 2016 120 tablet 0  . HYDROcodone-acetaminophen (NORCO) 10-325 MG tablet Take 1 tablet by mouth every 6 (six) hours as needed. 120 tablet 0  . losartan-hydrochlorothiazide (HYZAAR) 50-12.5 MG tablet TAKE 1 TABLET BY MOUTH  DAILY 90 tablet 1   No  facility-administered medications prior to visit.     Review of Systems;  Patient denies headache, fevers, malaise, unintentional weight loss, skin rash, eye pain, sinus congestion and sinus pain, sore throat, dysphagia,  hemoptysis , cough, dyspnea, wheezing, chest pain, palpitations, orthopnea, edema, abdominal pain, nausea, melena, diarrhea, constipation, flank pain, dysuria, hematuria, urinary  Frequency, nocturia, numbness, tingling, seizures,  Focal weakness, Loss of consciousness,  Tremor, insomnia, depression, anxiety, and suicidal ideation.      Objective:  Temp 97.7 F (36.5 C) (Oral)   Resp 12   Ht 6\' 3"  (1.905 m)   Wt (!) 369 lb (167.4 kg)   SpO2 96%   BMI 46.12 kg/m   BP Readings from Last 3 Encounters:  08/10/16 140/72  08/02/16 (!) 144/84  06/22/16 130/72    Wt Readings from Last 3 Encounters:  09/22/16 (!) 369 lb (167.4 kg)  08/10/16 (!) 369 lb 1.9 oz (167.4 kg)  08/02/16 (!) 371 lb (168.3 kg)    General appearance: alert, cooperative and appears stated age Ears: normal TM's and external ear canals both ears Throat: lips, mucosa, and tongue normal; teeth and gums normal Neck: no adenopathy, no carotid bruit, supple, symmetrical, trachea midline and thyroid not enlarged, symmetric, no tenderness/mass/nodules Back: symmetric, no curvature. ROM normal. No CVA tenderness. Lungs: clear to auscultation bilaterally Heart: regular rate and rhythm, S1, S2 normal, no murmur, click, rub or gallop Abdomen: soft, non-tender; bowel sounds normal; no masses,  no organomegaly Pulses: 2+ and symmetric Skin: Skin color, texture, turgor normal. No rashes or lesions Lymph nodes: Cervical, supraclavicular, and axillary nodes normal.  Lab Results  Component Value Date   HGBA1C 5.0 03/29/2016    Lab Results  Component Value Date   CREATININE 0.94 09/15/2016   CREATININE 1.15 03/29/2016   CREATININE 1.01 12/16/2015    Lab Results  Component Value Date   WBC 6.7  09/15/2016   HGB 14.9 04/08/2015   HCT 43.0 09/15/2016   PLT 149 (L) 09/15/2016   GLUCOSE 91 09/15/2016   CHOL 183 09/15/2016   TRIG 70 09/15/2016   HDL 53 09/15/2016   LDLCALC 116 (H) 09/15/2016   ALT 29 09/15/2016   AST 26 09/15/2016   NA 143 09/15/2016   K 4.4 09/15/2016   CL 101 09/15/2016   CREATININE 0.94 09/15/2016   BUN 24 09/15/2016   CO2 20 09/15/2016   TSH 3.110 09/15/2016   INR 1.2 07/16/2013   HGBA1C 5.0 03/29/2016    No results found.  Assessment & Plan:   Problem List Items Addressed This Visit    Degenerative arthritis of right knee    With patella dislocation per recent evaluation.  Refill history confirmed via  [place] Controlled Substance databas, accessed by me today..      Relevant Medications   HYDROcodone-acetaminophen (NORCO) 10-325 MG tablet   HYDROcodone-acetaminophen (NORCO) 10-325 MG tablet   HYDROcodone-acetaminophen (NORCO) 10-325 MG tablet   Essential hypertension    His BP is not at goal currently. Last several readings have all been elevated,   Medications reviewed and compliance assessed via patient report.  Renal function and electolytes assessed.  Use of NSAIDs, oral decongestants and other medications/supplements reviewed    Lab Results  Component Value Date   CREATININE 0.94 09/15/2016   Lab Results  Component Value Date   NA 143 09/15/2016   K 4.4 09/15/2016   CL 101 09/15/2016   CO2 20 09/15/2016   the following changes were made to his regimen:  iincrease losartan/hct to 100/25 mg daily       Relevant Medications   losartan-hydrochlorothiazide (HYZAAR) 100-25 MG tablet      I have discontinued Mr. Champney losartan-hydrochlorothiazide. I have also changed his HYDROcodone-acetaminophen. Additionally, I am having him start on losartan-hydrochlorothiazide. Lastly, I am having him maintain his FERROUS GLUCONATE PO, multivitamin, B Complex Vitamins (B COMPLEX 50 PO), vitamin B-12, Vitamin D3, GLUCOSAMINE HCL-MSM PO, vitamin E,  nitroGLYCERIN, furosemide, Potassium Chloride ER, propranolol, metoprolol succinate, diclofenac, gabapentin, amiodarone, levothyroxine, venlafaxine XR, buPROPion, dabigatran, Docusate Sodium (DULCOLAX STOOL SOFTENER PO), HYDROcodone-acetaminophen, and HYDROcodone-acetaminophen.  Meds ordered this encounter  Medications  . Docusate Sodium (DULCOLAX STOOL SOFTENER PO)    Sig: Take 2 tablets by mouth daily.  Marland Kitchen DISCONTD: HYDROcodone-acetaminophen (NORCO) 10-325 MG tablet    Sig: Take 1 tablet by mouth every 6 (six) hours as needed.    Dispense:  120 tablet    Refill:  0    May refill on or After September 24 2016  . DISCONTD: HYDROcodone-acetaminophen (NORCO) 10-325 MG tablet    Sig: Take 1 tablet by mouth every 6 (six) hours as needed.    Dispense:  120 tablet    Refill:  0    May refill on or after Januaryr 15, 2017  . DISCONTD: HYDROcodone-acetaminophen (NORCO) 10-325 MG tablet    Sig: Take 1 tablet by mouth every 6 (six) hours as needed. May refill on or after November 26, 2015    Dispense:  120 tablet    Refill:  0  . HYDROcodone-acetaminophen (NORCO) 10-325 MG tablet    Sig: Take 1 tablet by mouth every 6 (six) hours as needed.    Dispense:  120 tablet    Refill:  0    May refill on or after October 25, 2016  . HYDROcodone-acetaminophen (NORCO) 10-325 MG tablet    Sig: Take 1 tablet by mouth every 6 (six) hours as needed. May refill on or after November 25, 2016    Dispense:  120 tablet    Refill:  0  . HYDROcodone-acetaminophen (NORCO) 10-325 MG tablet    Sig: Take 1 tablet by mouth every 6 (six) hours as needed.    Dispense:  120 tablet    Refill:  0    May refill on or After September 24 2016  . losartan-hydrochlorothiazide (HYZAAR) 100-25 MG tablet    Sig: Take 1 tablet by mouth daily.    Dispense:  90 tablet    Refill:  3    Medications Discontinued During This Encounter  Medication Reason  . HYDROcodone-acetaminophen (NORCO) 10-325 MG tablet Reorder  .  HYDROcodone-acetaminophen (NORCO) 10-325 MG tablet Reorder  . HYDROcodone-acetaminophen (  NORCO) 10-325 MG tablet Reorder  . HYDROcodone-acetaminophen (NORCO) 10-325 MG tablet Reorder  . HYDROcodone-acetaminophen (NORCO) 10-325 MG tablet Reorder  . HYDROcodone-acetaminophen (NORCO) 10-325 MG tablet Reorder  . losartan-hydrochlorothiazide (HYZAAR) 50-12.5 MG tablet     Follow-up: Return in about 3 months (around 12/21/2016) for for medicaito nrefill .   Sherlene Shams, MD

## 2016-09-22 NOTE — Patient Instructions (Addendum)
You might try looking at [employer]  Online for compression sweat socks that you can wear at work  Have a wonderful Christams and happy New Year!!  See you in 3 months

## 2016-09-25 ENCOUNTER — Other Ambulatory Visit: Payer: Self-pay | Admitting: Cardiovascular Disease

## 2016-09-25 ENCOUNTER — Encounter: Payer: Self-pay | Admitting: Internal Medicine

## 2016-09-25 ENCOUNTER — Telehealth: Payer: Self-pay | Admitting: Internal Medicine

## 2016-09-25 MED ORDER — LOSARTAN POTASSIUM-HCTZ 100-25 MG PO TABS: 1.0000 | ORAL_TABLET | Freq: Every day | ORAL | 3 refills | Status: DC

## 2016-09-25 NOTE — Telephone Encounter (Signed)
MyChart message sent to increasd losartan dose to 100/25 daily

## 2016-09-25 NOTE — Assessment & Plan Note (Addendum)
His BP is not at goal currently. Last several readings have all been elevated,   Medications reviewed and compliance assessed via patient report.  Renal function and electolytes assessed.  Use of NSAIDs, oral decongestants and other medications/supplements reviewed    Lab Results  Component Value Date   CREATININE 0.94 09/15/2016   Lab Results  Component Value Date   NA 143 09/15/2016   K 4.4 09/15/2016   CL 101 09/15/2016   CO2 20 09/15/2016   the following changes were made to his regimen:  iincrease losartan/hct to 100/25 mg daily

## 2016-09-25 NOTE — Assessment & Plan Note (Addendum)
With patella dislocation per recent evaluation.  Refill history confirmed via [place] Controlled Substance databas, accessed by me today..Marland Kitchen

## 2016-09-27 ENCOUNTER — Other Ambulatory Visit: Payer: Self-pay | Admitting: Family Medicine

## 2016-09-28 ENCOUNTER — Encounter: Payer: Self-pay | Admitting: Family Medicine

## 2016-09-28 ENCOUNTER — Ambulatory Visit (INDEPENDENT_AMBULATORY_CARE_PROVIDER_SITE_OTHER): Payer: 59 | Admitting: Family Medicine

## 2016-09-28 DIAGNOSIS — M1711 Unilateral primary osteoarthritis, right knee: Secondary | ICD-10-CM | POA: Diagnosis not present

## 2016-09-28 MED ORDER — VENLAFAXINE HCL ER 37.5 MG PO CP24: 37.5000 mg | ORAL_CAPSULE | Freq: Every day | ORAL | 1 refills | Status: DC

## 2016-09-28 NOTE — Progress Notes (Signed)
Tawana ScaleZach Yasenia Reedy D.O. [facility] [address] [place], [place] [address] Phone: [phone] Subjective:     CC: Right knee pain f/u   [id number]:Subjective  Ashby Dawesdward A Drawdy is a 60 y.o. male coming in with complaint of right knee pain. Patient has had a left knee replacement. States that the pain is severe at this time. Having increasing instability. Patient has received his custom brace today. Having no pain. Did have a corticosteroid injection that did not make significant improvement. Patient's last injection was 6 weeks ago. Patient states minimal improvement. Patient states now worsening symptoms. Has fallen twice since last exam. Concern of his safety with him walking.  Patient is also complaining of hand and foot pain. Has been diagnosed with the peripheral neuropathy. neurologist but unable to tolerate increasing dose of gabapentin. also because the patient's comorbidities and multiple other medications can be very hard. continues on chronic pain medications at this time. Patient was started on Effexor and states that the nighttime pain has significantly improved. Not having as much burning sensation. Very happy with the results. Has not notice any significant side effects.  Past Medical History:  Diagnosis Date  . Arthritis    a. chronic joint pain  . B12 deficiency   . Chronic systolic CHF (congestive heart failure) (HCC)    a. echo 2013: EF of 50-55%, normal right ventricular systolic pressure, normal left atrium; b. EF 40-45%, inadequate for LV wall motion, mild MR, LA severely dilated @ 52 mm, PASP nl  . History of DVT (deep vein thrombosis) 2004   s/p bariatric surgery  . History of stress test    a. there was no ST segment deviation noted during stress,    There is a small defect of moderate severity present in the apex location, suggestive of apical ischemia, intermediate risk, calculated EF 21% but visually appears to be 35-40%  . Hypertension   . Obesity   . OSA  (obstructive sleep apnea)    a. not compliant with CPAP  . PAF (paroxysmal atrial fibrillation) (HCC)    a. CHADSVASc at least 2 (HTN and vascular disease); b. on Eliquis    Past Surgical History:  Procedure Laterality Date  . ARTHROSCOPIC REPAIR ACL  1993   bilateral knee  . BARIATRIC SURGERY  2004   [place]   . BARIATRIC SURGERY    . CHOLECYSTECTOMY  1982  . ELECTROPHYSIOLOGIC STUDY N/A 06/20/2015   Procedure: CARDIOVERSION;  Surgeon: Antonieta Ibaimothy J Gollan, MD;  Location: [facility] ORS;  Service: Cardiovascular;  Laterality: N/A;  . MENISCUS DEBRIDEMENT  2011   Left knee  . REPLACEMENT TOTAL KNEE Left    Social History  Substance Use Topics  . Smoking status: Never Smoker  . Smokeless tobacco: Never Used  . Alcohol use 1.2 oz/week    2 Shots of liquor per week     Comment: Vodka and lemonade occasionally 2-3/week    No Known Allergies Family History  Problem Relation Age of Onset  . Family history unknown: Yes       Review of Systems: No headache, visual changes, nausea, vomiting, diarrhea, constipation, dizziness, abdominal pain, skin rash, fevers, chills, night sweats, weight loss, swollen lymph nodes,  chest pain, shortness of breath, mood changes.    Objective  There were no vitals taken for this visit.  Systems examined below as of 09/28/16 General: NAD A&O x3 mood, affect normal obese HEENT: Pupils equal, extraocular movements intact no nystagmus Respiratory: not short of  breath at rest or with speaking Cardiovascular: No lower extremity edema, non tender Skin: Warm dry intact with no signs of infection or rash on extremities or on axial skeleton. Abdomen: Soft nontender, no masses Neuro: Cranial nerves  intact, neurovascularly intact in all extremities with 2+ DTRs and 2+ pulses. Lymph: No lymphadenopathy appreciated today  Gait antalgic gait noted.  MSK:  Non tender with full range of motion and good stability and symmetric strength and tone of  elbows, wrist, hip,  and ankles bilaterally. Significant arthritic changes of multiple joints Knee: Right Severe arthritic changes with chronic subluxation of the patella Large thigh to calf ratio Continued tenderness to palpation over the medial and lateral joint lines ROM full in flexion and extension and lower leg rotation. Significant valgus deformity with significant instability with valgus force Patellar glide severe crepitus. Patellar and quadriceps tendons unremarkable. Hamstring and quadriceps strength is normal.  Contralateral knee total replacement No significant change except for potentially worsening symptoms.    Impression and Recommendations:     This case required medical decision making of moderate complexity.

## 2016-09-28 NOTE — Telephone Encounter (Signed)
Refill done.  

## 2016-09-28 NOTE — Patient Instructions (Addendum)
Good to see you  Lets continue same dose of effexor.  Ice is your friend [place] if it is helping.  I hope the brace helps Happy holidays!  See me when you need me.

## 2016-09-28 NOTE — Assessment & Plan Note (Signed)
Discussed with patient again at great length. Patient knows he will likely need a knee replacement at some point. Patient declined viscous supplementation today. We discussed icing regimen and home exercises. We discussed that the stability brace should be worn with any activity outside regular daily activities. Patient and will follow-up with me again on an as-needed basis unless anything changes.

## 2016-10-12 NOTE — Telephone Encounter (Signed)
Mailed unread message to patient.  

## 2016-10-21 ENCOUNTER — Other Ambulatory Visit: Payer: Self-pay | Admitting: Internal Medicine

## 2016-10-22 NOTE — Telephone Encounter (Signed)
Left detailed message notifying Rx sent to [facility].

## 2016-10-22 NOTE — Telephone Encounter (Signed)
Rx refill request Diclofenac 75 mg  Last OV 1213/17 Last refilled 04/07/16 Next OV 12/21/2016 Please advise

## 2016-11-21 ENCOUNTER — Other Ambulatory Visit: Payer: Self-pay | Admitting: Family Medicine

## 2016-11-22 NOTE — Telephone Encounter (Signed)
Refill done.  

## 2016-11-26 ENCOUNTER — Other Ambulatory Visit: Payer: Self-pay | Admitting: Internal Medicine

## 2016-12-21 ENCOUNTER — Ambulatory Visit (INDEPENDENT_AMBULATORY_CARE_PROVIDER_SITE_OTHER): Payer: 59 | Admitting: Internal Medicine

## 2016-12-21 ENCOUNTER — Encounter: Payer: Self-pay | Admitting: Internal Medicine

## 2016-12-21 VITALS — BP 144/62 | HR 65 | Resp 15 | Ht 75.0 in | Wt 367.2 lb

## 2016-12-21 DIAGNOSIS — R5382 Chronic fatigue, unspecified: Secondary | ICD-10-CM | POA: Diagnosis not present

## 2016-12-21 DIAGNOSIS — M25312 Other instability, left shoulder: Secondary | ICD-10-CM

## 2016-12-21 DIAGNOSIS — M1711 Unilateral primary osteoarthritis, right knee: Secondary | ICD-10-CM

## 2016-12-21 DIAGNOSIS — M159 Polyosteoarthritis, unspecified: Secondary | ICD-10-CM

## 2016-12-21 MED ORDER — HYDROCODONE-ACETAMINOPHEN 10-325 MG PO TABS: 1.0000 | ORAL_TABLET | Freq: Four times a day (QID) | ORAL | 0 refills | Status: DC | PRN

## 2016-12-21 MED ORDER — CYCLOBENZAPRINE HCL 10 MG PO TABS: 10.0000 mg | ORAL_TABLET | Freq: Three times a day (TID) | ORAL | 0 refills | Status: DC | PRN

## 2016-12-21 MED ORDER — PREDNISONE 10 MG PO TABS: ORAL_TABLET | ORAL | 0 refills | Status: DC

## 2016-12-21 NOTE — Assessment & Plan Note (Signed)
Aggravated by recent fall .  Steroid taper,  Flexeril x 2 weeks

## 2016-12-21 NOTE — Assessment & Plan Note (Signed)
With patella dislocation per recent evaluation.  Refill history confirmed via [place] Controlled Substance databas, accessed by me today.. 

## 2016-12-21 NOTE — Progress Notes (Signed)
Pre visit review using our clinic review tool, if applicable. No additional management support is needed unless otherwise documented below in the visit note. 

## 2016-12-21 NOTE — Assessment & Plan Note (Signed)
He is now contemplating right knee replacement

## 2016-12-21 NOTE — Assessment & Plan Note (Signed)
I have addressed  BMI and recommended wt loss of 10% of body weight over the next 6 months using a low fat, fruit/vegetable based [place] diet and regular exercise a minimum of 5 days per week.   

## 2016-12-21 NOTE — Patient Instructions (Addendum)
Your left shoulder pain may be from spasm of your lat and rotator cuff  Muscles from your fall.    You can increase the evening  dose of gabapentin to 300 and add 100 to 300 at bedtime if needed for shoulder to elbow pain   Flexeril and prednisone for muscle spasm.  If no improvement in two weeks, I suggest seeing Dr Katrinka BlazingSmith     Dr Francesco SorJames Hooten,   Dr Carma LairFrank Alucio  In [facility].  Referral under way   Your Labs  In December including thyroid are all normal.  Nothing to suggest a cause for your profound fatigue.  Fatigue is much more common in the winter months for various reasons including diet,  decreased activity,  And sometimes can be due to decreased sunlight leading to seasonal affective disorder , which causes depression . We could increase your wellbutrin if you think depression may be contributing

## 2016-12-21 NOTE — Assessment & Plan Note (Signed)
Multifactorial.  Screening labs normal in December.  Has untreated sleep apnea.  Recommended increasing his wellbutrin every other day .   Lab Results  Component Value Date   CREATININE 0.79 11/10/2015   Lab Results  Component Value Date   ALT 14 11/10/2015   AST 18 11/10/2015   ALKPHOS 69 11/10/2015   BILITOT 0.6 11/10/2015   Lab Results  Component Value Date   TSH 1.68 11/10/2015   Lab Results  Component Value Date   WBC 8.6 11/10/2015   HGB 14.7 11/10/2015   HCT 43.8 11/10/2015   MCV 92.7 11/10/2015   PLT 248.0 11/10/2015
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
## 2017-01-29 ENCOUNTER — Other Ambulatory Visit: Payer: Self-pay | Admitting: Internal Medicine

## 2017-01-31 ENCOUNTER — Other Ambulatory Visit: Payer: Self-pay

## 2017-01-31 MED ORDER — DICLOFENAC SODIUM 75 MG PO TBEC: 75.0000 mg | DELAYED_RELEASE_TABLET | Freq: Two times a day (BID) | ORAL | 0 refills | Status: DC

## 2017-03-14 ENCOUNTER — Telehealth: Payer: Self-pay | Admitting: Cardiovascular Disease

## 2017-03-14 NOTE — Telephone Encounter (Signed)
Received cardiac clearance request for pt to proceed w/ Rt TKR.  DOS has not been scheduled yet.  Per Dr. Ernest PineHooten: "Do you have any objections to holding his Pradaxa preoperatively and then restarting it on postoperative day 1?" Please [facility] @ [phone].

## 2017-03-22 NOTE — Telephone Encounter (Signed)
Okay to follow instructions Hold anticoagulation 2 days prior to surgery and restart day 1 after surgery No further testing needed Acceptable risk

## 2017-03-22 NOTE — Telephone Encounter (Signed)
Clearance routed to number provided.  

## 2017-03-25 ENCOUNTER — Ambulatory Visit: Payer: 59 | Admitting: Internal Medicine

## 2017-03-25 ENCOUNTER — Other Ambulatory Visit: Payer: Self-pay | Admitting: Family Medicine

## 2017-03-29 ENCOUNTER — Ambulatory Visit (INDEPENDENT_AMBULATORY_CARE_PROVIDER_SITE_OTHER): Payer: Self-pay | Admitting: Internal Medicine

## 2017-03-29 ENCOUNTER — Encounter: Payer: Self-pay | Admitting: Internal Medicine

## 2017-03-29 VITALS — BP 128/72 | HR 59 | Temp 98.5°F | Resp 16 | Ht 75.0 in | Wt 361.0 lb

## 2017-03-29 DIAGNOSIS — M159 Polyosteoarthritis, unspecified: Secondary | ICD-10-CM

## 2017-03-29 DIAGNOSIS — Z79899 Other long term (current) drug therapy: Secondary | ICD-10-CM

## 2017-03-29 MED ORDER — HYDROCODONE-ACETAMINOPHEN 10-325 MG PO TABS: 1.0000 | ORAL_TABLET | ORAL | 0 refills | Status: DC | PRN

## 2017-03-29 MED ORDER — HYDROCODONE-ACETAMINOPHEN 10-325 MG PO TABS: 1.0000 | ORAL_TABLET | Freq: Four times a day (QID) | ORAL | 0 refills | Status: DC | PRN

## 2017-03-29 NOTE — Patient Instructions (Signed)
I have refilled your hydrocodone for June, July and August.  With additional one tablet per day for the month of June when he has to work additional hours because of the vacation holiday  I will send Dr Ernest Pinehooten a preop medical clearance and recommend that your Pradaxa be stopped one week prior and that you start lovenox several days prior to surgery to mitigate your risk for stroke

## 2017-03-29 NOTE — Progress Notes (Signed)
Subjective:  Patient ID: Johnathan [name], male    DOB: 06/02/1956  Age: 61 y.o. MRN: [id number]  CC: The primary encounter diagnosis was Long-term use of high-risk medication. 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  HPI Johnathan [name] presents for follow up on chronic joint pain and morbid obesity.    Sept 10th having right knee replacement by [employer] planned, wants to lose 35 lbs.  Has lost 5 so far.  Takes Pradaxa for management of increased embolic stroke due to atrial fibrillation.  Discussed the possibility of bridging him with Lovenox several days prior to surgery to mitigate his risk of  stroke during  the pradaxa suspension period .  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 .    Medication Refill history confirmed via [place] Controlled Substance databas, accessed by me today..  No early refills or meds from other providers.   Sprained his left thumb recently .  Remote history of left thumb fracture (stress fracture) found incidentally  10 years ago. Wearing an immobilizer at night recommedned by Dr. Ernest Pine.    Depressive disorder:  Managed with wellbutrin/ Alternates between  150 mg and 300 mg of wellbutrin on a daily basis to mitigate the effects on his sleep.   Lab Results  Component Value Date   HGBA1C 5.0 03/29/2016        Outpatient Medications Prior to Visit  Medication Sig Dispense Refill  . amiodarone (PACERONE) 200 MG tablet Take 1 tablet by mouth two  times daily 180 tablet 3  . B Complex Vitamins (B COMPLEX 50 PO) Take 1 tablet by mouth daily.     Marland Kitchen buPROPion (WELLBUTRIN XL) 150 MG 24 hr tablet TAKE 1 TABLET (150 MG TOTAL) BY MOUTH DAILY. 90 tablet 1  . Cholecalciferol (VITAMIN D3) 2000 UNITS  TABS Take 1 tablet by mouth daily.     . cyclobenzaprine (FLEXERIL) 10 MG tablet Take 1 tablet (10 mg total) by mouth 3 (three) times daily as needed for muscle spasms. 30 tablet 0  . dabigatran (PRADAXA) 150 MG CAPS capsule Take 1 capsule (150 mg total) by mouth 2 (two) times daily. 60 capsule 11  . diclofenac (VOLTAREN) 75 MG EC tablet Take 1 tablet (75 mg total) by mouth 2 (two) times daily. 180 tablet 0  . Docusate Sodium (DULCOLAX STOOL SOFTENER PO) Take 2 tablets by mouth daily.    Marland Kitchen FERROUS GLUCONATE PO Take by mouth daily.    . furosemide (LASIX) 40 MG tablet Take 1 tablet (40 mg total) by mouth 2 (two) times daily as needed. 180 tablet 3  . gabapentin (NEURONTIN) 100 MG capsule Take 1 capsule by mouth  every 6 hours 360 capsule 1  . GLUCOSAMINE HCL-MSM PO Take 1 tablet by mouth daily.     Marland Kitchen HYDROcodone-acetaminophen (NORCO) 10-325 MG tablet Take 1 tablet by mouth every 6 (six) hours as needed. May refill on or after December 23, 2016 120 tablet 0  . levothyroxine (SYNTHROID, LEVOTHROID) 25 MCG tablet Take 1 tablet (25 mcg total) by mouth daily before breakfast. 90 tablet 1  . losartan-hydrochlorothiazide (HYZAAR) 100-25 MG tablet Take 1 tablet by mouth daily. 90 tablet 3  . metoprolol  succinate (TOPROL-XL) 25 MG 24 hr tablet TAKE 1 TABLET (25 MG TOTAL) BY MOUTH DAILY. 90 tablet 1  . Multiple Vitamin (MULTIVITAMIN) tablet Take 1 tablet by mouth daily.    . nitroGLYCERIN (NITROSTAT) 0.4 MG SL tablet Place 1 tablet (0.4 mg total) under the tongue every 5 (five) minutes as needed for chest pain. 30 tablet 3  . Potassium Chloride ER 20 MEQ TBCR Take 20 mEq by mouth 2 (two) times daily as needed. 180 tablet 3  . propranolol (INDERAL) 20 MG tablet Take 20 mg by mouth 3 (three) times daily as needed. Reported on 03/22/2016    . venlafaxine XR (EFFEXOR XR) 37.5 MG 24 hr capsule Take 1 capsule (37.5 mg total) by mouth daily with breakfast. 90 capsule 1  . vitamin B-12 (CYANOCOBALAMIN) 500 MCG tablet  Take 500 mcg by mouth daily.    . vitamin E 400 UNIT capsule Take 400 Units by mouth daily.    Marland Kitchen. HYDROcodone-acetaminophen (NORCO) 10-325 MG tablet Take 1 tablet by mouth every 6 (six) hours as needed. 120 tablet 0  . HYDROcodone-acetaminophen (NORCO) 10-325 MG tablet Take 1 tablet by mouth every 6 (six) hours as needed. 120 tablet 0  . levothyroxine (SYNTHROID, LEVOTHROID) 25 MCG tablet TAKE 1 TABLET BY MOUTH  DAILY BEFORE BREAKFAST (Patient not taking: Reported on 03/29/2017) 90 tablet 1  . potassium chloride SA (K-DUR,KLOR-CON) 20 MEQ tablet Take by mouth.    . predniSONE (DELTASONE) 10 MG tablet 6 tablets on Day 1 , then reduce by 1 tablet daily until gone (Patient not taking: Reported on 03/29/2017) 21 tablet 0  . venlafaxine XR (EFFEXOR-XR) 37.5 MG 24 hr capsule TAKE 1 CAPSULE (37.5 MG TOTAL) BY MOUTH DAILY WITH BREAKFAST. (Patient not taking: Reported on 03/29/2017) 90 capsule 1   No facility-administered medications prior to visit.     Review of Systems;  Patient denies headache, fevers, malaise, unintentional weight loss, skin rash, eye pain, sinus congestion and sinus pain, sore throat, dysphagia,  hemoptysis , cough, dyspnea, wheezing, chest pain, palpitations, orthopnea, edema, abdominal pain, nausea, melena, diarrhea, constipation, flank pain, dysuria, hematuria, urinary  Frequency, nocturia, numbness, tingling, seizures,  Focal weakness, Loss of consciousness,  Tremor, insomnia, depression, anxiety, and suicidal ideation.      Objective:  BP 128/72 (BP Location: Left Arm, Patient Position: Sitting, Cuff Size: Large)   Pulse (!) 59   Temp 98.5 F (36.9 C) (Oral)   Resp 16   Ht 6\' 3"  (1.905 m)   Wt (!) 361 lb (163.7 kg)   SpO2 96%   BMI 45.12 kg/m   BP Readings from Last 3 Encounters:  03/29/17 128/72  12/21/16 (!) 144/62  08/10/16 140/72    Wt Readings from Last 3 Encounters:  03/29/17 (!) 361 lb (163.7 kg)  12/21/16 (!) 367 lb 3.2 oz (166.6 kg)  09/22/16 (!) 369 lb  (167.4 kg)    General appearance: alert, cooperative and appears stated age Ears: normal TM's and external ear canals both ears Throat: lips, mucosa, and tongue normal; teeth and gums normal Neck: no adenopathy, no carotid bruit, supple, symmetrical, trachea midline and thyroid not enlarged, symmetric, no tenderness/mass/nodules Back: symmetric, no curvature. ROM normal. No CVA tenderness. Lungs: clear to auscultation bilaterally Heart: regular rate and rhythm, S1, S2 normal, no murmur, click, rub or gallop Abdomen: soft, non-tender; bowel sounds normal; no masses,  no organomegaly Pulses: 2+ and symmetric Skin: Skin color, texture, turgor normal. No rashes or lesions Lymph nodes: Cervical, supraclavicular, and  axillary nodes normal.  Lab Results  Component Value Date   HGBA1C 5.0 03/29/2016    Lab Results  Component Value Date   CREATININE 1.12 03/29/2017   CREATININE 0.94 09/15/2016   CREATININE 1.15 03/29/2016    Lab Results  Component Value Date   WBC 6.7 09/15/2016   HGB 14.6 09/15/2016   HCT 43.0 09/15/2016   PLT 149 (L) 09/15/2016   GLUCOSE 86 03/29/2017   CHOL 183 09/15/2016   TRIG 70 09/15/2016   HDL 53 09/15/2016   LDLCALC 116 (H) 09/15/2016   ALT 30 03/29/2017   AST 32 03/29/2017   NA 143 03/29/2017   K 4.5 03/29/2017   CL 104 03/29/2017   CREATININE 1.12 03/29/2017   BUN 22 03/29/2017   CO2 23 03/29/2017   TSH 3.110 09/15/2016   INR 1.2 07/16/2013   HGBA1C 5.0 03/29/2016    No results found.  Assessment & Plan:   Preoperative evaluation to rule out surgical contraindication He is cleared medically for the surgery but I have advised him to see his cardiologist as well.  Discussed bridging anticoagulation therapy during Pradaxa suspension with q 12 hour Lovenox, which he is not opposed to.    Severe obesity (BMI >= 40) I have congratulated him  in reduction of   BMI and encouraged  Continued weight loss with goal of  5 % of body weigh over the next  3 months using a low glycemic index diet and water aerobic  exercise a minimum of 5 days per week.    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 he has not had any ER visits  And has not requested any early refills.  His Refill history was confirmed via [place] Controlled Substance database by me today during his visit and there have been no prescriptions of controlled substances filled from any providers other than me. . I have refilled his hydrocodone for 3 months,  Additional 30 tablets of hydrocodone for the month of June to manage the increased pain he has had due to increased length of shift during which he stands.   A-fib He is rate controlled but symptomatic due to morbid obesity and deconditioning. Follow up with cardiology for repeat ECHO priror to surgery    Updated Medication List Outpatient Encounter Prescriptions as of 03/29/2017  Medication Sig  . amiodarone (PACERONE) 200 MG tablet Take 1 tablet by mouth two  times daily  . B Complex Vitamins (B COMPLEX 50 PO) Take 1 tablet by mouth daily.   Marland Kitchen buPROPion (WELLBUTRIN XL) 150 MG 24 hr tablet TAKE 1 TABLET (150 MG TOTAL) BY MOUTH DAILY.  Marland Kitchen Cholecalciferol (VITAMIN D3) 2000 UNITS TABS Take 1 tablet by mouth daily.   . cyclobenzaprine (FLEXERIL) 10 MG tablet Take 1 tablet (10 mg total) by mouth 3 (three) times daily as needed for muscle spasms.  . dabigatran (PRADAXA) 150 MG CAPS capsule Take 1 capsule (150 mg total) by mouth 2 (two) times daily.  . diclofenac (VOLTAREN) 75 MG EC tablet Take 1 tablet (75 mg total) by mouth 2 (two) times daily.  Johnathan [name] Sodium (DULCOLAX STOOL SOFTENER PO) Take 2 tablets by mouth daily.  Marland Kitchen FERROUS GLUCONATE PO Take by mouth daily.  . furosemide (LASIX) 40 MG tablet Take 1 tablet (40 mg total) by mouth 2 (two) times daily as needed.  . gabapentin (NEURONTIN) 100 MG capsule Take 1 capsule by mouth  every  6 hours  . GLUCOSAMINE  HCL-MSM PO Take 1 tablet by mouth daily.   Marland Kitchen HYDROcodone-acetaminophen (NORCO) 10-325 MG tablet Take 1 tablet by mouth every 6 (six) hours as needed. May refill on or after December 23, 2016  . HYDROcodone-acetaminophen (NORCO) 10-325 MG tablet Take 1 tablet by mouth every 4 (four) hours as needed.  Marland Kitchen HYDROcodone-acetaminophen (NORCO) 10-325 MG tablet Take 1 tablet by mouth every 6 (six) hours as needed.  Marland Kitchen levothyroxine (SYNTHROID, LEVOTHROID) 25 MCG tablet Take 1 tablet (25 mcg total) by mouth daily before breakfast.  . losartan-hydrochlorothiazide (HYZAAR) 100-25 MG tablet Take 1 tablet by mouth daily.  . metoprolol succinate (TOPROL-XL) 25 MG 24 hr tablet TAKE 1 TABLET (25 MG TOTAL) BY MOUTH DAILY.  . Multiple Vitamin (MULTIVITAMIN) tablet Take 1 tablet by mouth daily.  . nitroGLYCERIN (NITROSTAT) 0.4 MG SL tablet Place 1 tablet (0.4 mg total) under the tongue every 5 (five) minutes as needed for chest pain.  Marland Kitchen Potassium Chloride ER 20 MEQ TBCR Take 20 mEq by mouth 2 (two) times daily as needed.  . propranolol (INDERAL) 20 MG tablet Take 20 mg by mouth 3 (three) times daily as needed. Reported on 03/22/2016  . venlafaxine XR (EFFEXOR XR) 37.5 MG 24 hr capsule Take 1 capsule (37.5 mg total) by mouth daily with breakfast.  . vitamin B-12 (CYANOCOBALAMIN) 500 MCG tablet Take 500 mcg by mouth daily.  . vitamin E 400 UNIT capsule Take 400 Units by mouth daily.  . [DISCONTINUED] HYDROcodone-acetaminophen (NORCO) 10-325 MG tablet Take 1 tablet by mouth every 6 (six) hours as needed.  . [DISCONTINUED] HYDROcodone-acetaminophen (NORCO) 10-325 MG tablet Take 1 tablet by mouth every 6 (six) hours as needed.  . [DISCONTINUED] HYDROcodone-acetaminophen (NORCO) 10-325 MG tablet Take 1 tablet by mouth every 6 (six) hours as needed.  . [DISCONTINUED] levothyroxine (SYNTHROID, LEVOTHROID) 25 MCG tablet TAKE 1 TABLET BY MOUTH  DAILY BEFORE BREAKFAST (Patient not taking: Reported on 03/29/2017)  . [DISCONTINUED]  potassium chloride SA (K-DUR,KLOR-CON) 20 MEQ tablet Take by mouth.  . [DISCONTINUED] predniSONE (DELTASONE) 10 MG tablet 6 tablets on Day 1 , then reduce by 1 tablet daily until gone (Patient not taking: Reported on 03/29/2017)  . [DISCONTINUED] venlafaxine XR (EFFEXOR-XR) 37.5 MG 24 hr capsule TAKE 1 CAPSULE (37.5 MG TOTAL) BY MOUTH DAILY WITH BREAKFAST. (Patient not taking: Reported on 03/29/2017)   No facility-administered encounter medications on file as of 03/29/2017.

## 2017-03-30 LAB — COMPREHENSIVE METABOLIC PANEL
ALT: 30 IU/L (ref 0–44)
AST: 32 IU/L (ref 0–40)
Albumin/Globulin Ratio: 1.6 (ref 1.2–2.2)
Albumin: 4.5 g/dL (ref 3.6–4.8)
Alkaline Phosphatase: 100 IU/L (ref 39–117)
BUN/Creatinine Ratio: 20 (ref 10–24)
BUN: 22 mg/dL (ref 8–27)
Bilirubin Total: 0.4 mg/dL (ref 0.0–1.2)
CALCIUM: 8.9 mg/dL (ref 8.6–10.2)
CO2: 23 mmol/L (ref 20–29)
CREATININE: 1.12 mg/dL (ref 0.76–1.27)
Chloride: 104 mmol/L (ref 96–106)
GFR calc Af Amer: 82 mL/min/{1.73_m2} (ref >59)
GFR, EST NON AFRICAN AMERICAN: 71 mL/min/{1.73_m2} (ref >59)
GLOBULIN, TOTAL: 2.8 g/dL (ref 1.5–4.5)
Glucose: 86 mg/dL (ref 65–99)
Potassium: 4.5 mmol/L (ref 3.5–5.2)
SODIUM: 143 mmol/L (ref 134–144)
Total Protein: 7.3 g/dL (ref 6.0–8.5)

## 2017-03-30 NOTE — Assessment & Plan Note (Signed)
I have congratulated him  in reduction of   BMI and encouraged  Continued weight loss with goal of  5 % of body weigh over the next 3 months using a low glycemic index diet and water aerobic  exercise a minimum of 5 days per week.

## 2017-03-30 NOTE — Assessment & Plan Note (Signed)
He is now anticipating  right knee replacement. he has not had any ER visits  And has not requested any early refills.  His Refill history was confirmed via [place] Controlled Substance database by me today during his visit and there have been no prescriptions of controlled substances filled from any providers other than me. . I have refilled his hydrocodone for 3 months,  Additional 30 tablets of hydrocodone for the month of June to manage the increased pain he has had due to increased length of shift during which he stands.

## 2017-03-30 NOTE — Assessment & Plan Note (Signed)
He is cleared medically for the surgery but I have advised him to see his cardiologist as well.  Discussed bridging anticoagulation therapy during Pradaxa suspension with q 12 hour Lovenox, which he is not opposed to.

## 2017-03-30 NOTE — Assessment & Plan Note (Signed)
He is rate controlled but symptomatic due to morbid obesity and deconditioning. Follow up with cardiology for repeat ECHO priror to surgery

## 2017-03-31 ENCOUNTER — Encounter: Payer: Self-pay | Admitting: Internal Medicine

## 2017-04-14 NOTE — Telephone Encounter (Signed)
Mailed unread message to patient.  

## 2017-05-13 ENCOUNTER — Other Ambulatory Visit: Payer: Self-pay | Admitting: Internal Medicine

## 2017-05-16 ENCOUNTER — Other Ambulatory Visit: Payer: Self-pay

## 2017-05-16 MED ORDER — AMIODARONE HCL 200 MG PO TABS: 200.0000 mg | ORAL_TABLET | Freq: Two times a day (BID) | ORAL | 3 refills | Status: DC

## 2017-05-16 NOTE — Telephone Encounter (Signed)
Refill sent for amiodarone to [facility]

## 2017-05-17 ENCOUNTER — Other Ambulatory Visit: Payer: Self-pay | Admitting: Family Medicine

## 2017-05-17 NOTE — Telephone Encounter (Signed)
Per dr Katrinka Blazingsmith. Refill denied. Check with PCP for refill.

## 2017-05-19 ENCOUNTER — Other Ambulatory Visit: Payer: Self-pay | Admitting: Internal Medicine

## 2017-06-09 ENCOUNTER — Encounter
Admission: RE | Admit: 2017-06-09 | Discharge: 2017-06-09 | Disposition: A | Discharge status: Home / Self Care | Payer: 59 | Source: Ambulatory Visit | Attending: Orthopedic Surgery | Admitting: Orthopedic Surgery

## 2017-06-09 DIAGNOSIS — R001 Bradycardia, unspecified: Secondary | ICD-10-CM | POA: Diagnosis not present

## 2017-06-09 DIAGNOSIS — Z01818 Encounter for other preprocedural examination: Secondary | ICD-10-CM | POA: Diagnosis present

## 2017-06-09 DIAGNOSIS — Z01812 Encounter for preprocedural laboratory examination: Secondary | ICD-10-CM | POA: Insufficient documentation

## 2017-06-09 DIAGNOSIS — I10 Essential (primary) hypertension: Secondary | ICD-10-CM | POA: Insufficient documentation

## 2017-06-09 DIAGNOSIS — Z0183 Encounter for blood typing: Secondary | ICD-10-CM | POA: Diagnosis not present

## 2017-06-09 DIAGNOSIS — M1711 Unilateral primary osteoarthritis, right knee: Secondary | ICD-10-CM | POA: Insufficient documentation

## 2017-06-09 DIAGNOSIS — I480 Paroxysmal atrial fibrillation: Secondary | ICD-10-CM | POA: Insufficient documentation

## 2017-06-09 HISTORY — DX: Hypothyroidism, unspecified: E03.9

## 2017-06-09 HISTORY — DX: Polyneuropathy, unspecified: G62.9

## 2017-06-09 LAB — SURGICAL PCR SCREEN
MRSA, PCR: NEGATIVE
Staphylococcus aureus: NEGATIVE

## 2017-06-09 LAB — CBC
HEMATOCRIT: 40.5 % (ref 40.0–52.0)
Hemoglobin: 13.8 g/dL (ref 13.0–18.0)
MCH: 31 pg (ref 26.0–34.0)
MCHC: 34.2 g/dL (ref 32.0–36.0)
MCV: 90.7 fL (ref 80.0–100.0)
Platelets: 152 10*3/uL (ref 150–440)
RBC: 4.46 MIL/uL (ref 4.40–5.90)
RDW: 14.1 % (ref 11.5–14.5)
WBC: 6.7 10*3/uL (ref 3.8–10.6)

## 2017-06-09 LAB — URINALYSIS, ROUTINE W REFLEX MICROSCOPIC
Bilirubin Urine: NEGATIVE
Glucose, UA: NEGATIVE mg/dL
Hgb urine dipstick: NEGATIVE
KETONES UR: NEGATIVE mg/dL
LEUKOCYTES UA: NEGATIVE
NITRITE: NEGATIVE
PH: 5 (ref 5.0–8.0)
Protein, ur: NEGATIVE mg/dL
SPECIFIC GRAVITY, URINE: 1.021 (ref 1.005–1.030)

## 2017-06-09 LAB — COMPREHENSIVE METABOLIC PANEL
ALT: 35 U/L (ref 17–63)
AST: 35 U/L (ref 15–41)
Albumin: 4 g/dL (ref 3.5–5.0)
Alkaline Phosphatase: 90 U/L (ref 38–126)
Anion gap: 8 (ref 5–15)
BILIRUBIN TOTAL: 0.6 mg/dL (ref 0.3–1.2)
BUN: 24 mg/dL — AB (ref 6–20)
CO2: 26 mmol/L (ref 22–32)
CREATININE: 0.92 mg/dL (ref 0.61–1.24)
Calcium: 9 mg/dL (ref 8.9–10.3)
Chloride: 108 mmol/L (ref 101–111)
GFR calc Af Amer: >60 mL/min (ref >60)
Glucose, Bld: 104 mg/dL — ABNORMAL HIGH (ref 65–99)
Potassium: 3.9 mmol/L (ref 3.5–5.1)
Sodium: 142 mmol/L (ref 135–145)
TOTAL PROTEIN: 7.2 g/dL (ref 6.5–8.1)

## 2017-06-09 LAB — PROTIME-INR
INR: 1.21
PROTHROMBIN TIME: 15.2 s (ref 11.4–15.2)

## 2017-06-09 LAB — TYPE AND SCREEN
ABO/RH(D): O POS
Antibody Screen: NEGATIVE

## 2017-06-09 LAB — C-REACTIVE PROTEIN: CRP: <0.8 mg/dL (ref <1.0)

## 2017-06-09 LAB — APTT: APTT: 38 s — AB (ref 24–36)

## 2017-06-09 LAB — SEDIMENTATION RATE: SED RATE: 6 mm/h (ref 0–20)

## 2017-06-09 NOTE — Patient Instructions (Signed)
Your procedure is scheduled on: Monday, June 20, 2017 Report to Same Day Surgery on the 2nd floor in the [facility]. To find out your arrival time, please call [phone] between 1PM - 3PM on: Friday, June 17, 2017  REMEMBER: Instructions that are not followed completely may result in serious medical risk up to and including death; or upon the discretion of your surgeon and anesthesiologist your surgery may need to be rescheduled.  Do not eat food or drink liquids after midnight. No gum chewing or hard candies.  You may however, drink CLEAR liquids up to 2 hours before you are scheduled to arrive at the hospital for your procedure.  Do not drink clear liquids within 2 hours of your scheduled arrival to the hospital as this may lead to your procedure being delayed or rescheduled.  Clear liquids include: - water  - apple juice without pulp - clear gatorade - black coffee or tea (NO milk, creamers, sugars) DO NOT drink anything not on this list.  No Alcohol for 24 hours before or after surgery.  No Smoking for 24 hours prior to surgery.  Notify your doctor if there is any change in your medical condition (cold, fever, infection).  Do not wear jewelry, make-up, hairpins, clips or nail polish.  Do not wear lotions, powders, or perfumes.   Do not shave 48 hours prior to surgery. Men may shave face and neck.  Contacts and dentures may not be worn into surgery.  Do not bring valuables to the hospital. [facility] is not responsible for any belongings or valuables.   TAKE THESE MEDICATIONS THE MORNING OF SURGERY WITH A SIP OF WATER:  1.  AMIODARONE 2.  WELLBUTRIN 3.  GABAPENTIN 4.  SYNTHROID 5.  METOPROLOL 6.  EFFEXOR  Use CHG Soap or wipes as directed on instruction sheet.  Follow recommendations from Cardiologist, surgeon or PCP regarding stopping Pradaxa AND diclofenac. (stop 5-7 days prior to surgery)  NOW! Stop Anti-inflammatories such as Advil, Aleve,  Ibuprofen, Motrin, Naproxen, Naprosyn, Goodie powder, or aspirin products. (May take Tylenol or Acetaminophen if needed.)  NOW! Stop supplements until after surgery. (May continue Vitamin D, Vitamin B, and multivitamin.)  If you are being admitted to the hospital overnight, leave your suitcase in the car. After surgery it may be brought to your room.  Please call the number above if you have any questions about these instructions.

## 2017-06-10 LAB — URINE CULTURE
CULTURE: NO GROWTH
SPECIAL REQUESTS: NORMAL

## 2017-06-13 NOTE — Progress Notes (Signed)
Cardiology Office Note  Date:  06/14/2017   ID:  Johnathan [name], DOB 07-21-1956, MRN [id number]  PCP:  Johnathan Shams, MD   Chief Complaint  Patient presents with  . other    EKG 12 month follow up. Patient denies chest pain and SOB. Pt needs surgical clearence for knee surgery. Meds reviewed verbally with patient.     HPI:  Johnathan [name] is a pleasant 61 year old gentleman with  obesity,  chronic joint pain of the shoulders and knees on long-term low-dose narcotics,  hypertension,  history of bariatric surgery,  atrial fibrillation in August 2013,   normal ejection fraction at that time  History of sleep apnea, currently not on his CPAP who presents for follow-up of his atrial fibrillation.   In follow-up today he reports that he is doing well except for chronic right knee pain He is requesting Clearance for total knee replacement on the right Scheduled with Dr. Ernest Pine next week  Denies any change to his current status, no shortness of breath, no chest pain No symptoms concerning for unstable angina Denies any tachycardia concerning for atrial fibrillation  Reports blood pressure typically well controlled, maintaining normal sinus rhythm  Denies any palpitations concerning for atrial fibrillation Tolerating anticoagulation previously had trouble on Xarelto and Eliquis.  Weight  runs high,no regular exercise program   EKG on today's visit shows normal sinus rhythm with rate 60 bpm, no significant ST or T-wave changes   Other past medical history  previous cardioversion,successful in restoring normal sinus rhythm   tick bite in June 2016, he developed atrial fibrillation initially with RVR. Started on digoxin, continued on metoprolol and diltiazem with improved heart rate. Echocardiogram and stress test at that time Cardiac exam shows slightly depressed ejection fraction, dilated left atrium, mild LVH. Ejection fraction estimated at 40-45%. Stress test with small  region of apical perfusion defect, depressed ejection fraction.  Previously reported having tachycardia when he feels hot, improved when he cools down Previously had significant shoulder pain Prior clinic visit with shortness of breath that improved with Lasix  In the hospital 05/31/2012 with left-sided chest pain and palpitations. He was given adenosine in the emergency room that showed atrial fibrillation. He received Cardizem and started on diltiazem infusion and converted to normal sinus rhythm overnight. He was started on anticoagulation, continued on long-acting Cardizem and was discharged after echocardiogram showed normal LV function.  Echocardiogram showed normal LV function with ejection fraction 50-55%, normal right ventricular systolic pressure, normal left atrium TSH 1.7  Event monitor was performed September 16 2 07/25/2012 that showed normal sinus rhythm with PVCs, no significant atrial fibrillation Blood pressure at home has been labile, typically in the 130-140 systolic range.    PMH:   has a past medical history of Arthritis; B12 deficiency; Chronic systolic CHF (congestive heart failure) (HCC); History of DVT (deep vein thrombosis) (2004); History of stress test; Hypertension; Hypothyroidism; Neuropathy; Obesity; OSA (obstructive sleep apnea); and PAF (paroxysmal atrial fibrillation) (HCC).  PSH:    Past Surgical History:  Procedure Laterality Date  . ARTHROSCOPIC REPAIR ACL Bilateral 1993   bilateral knee x2 on left and x2 on right  . BARIATRIC SURGERY  2004   Rou en Y   . BARIATRIC SURGERY    . CHOLECYSTECTOMY  1982  . ELECTROPHYSIOLOGIC STUDY N/A 06/20/2015   Procedure: CARDIOVERSION;  Surgeon: Antonieta Iba, MD;  Location: [facility] ORS;  Service: Cardiovascular;  Laterality: N/A;  . MENISCUS DEBRIDEMENT Left 2011   Left  knee  . REPLACEMENT TOTAL KNEE Left 07/2013  . TONSILLECTOMY      Current Outpatient Prescriptions  Medication Sig Dispense Refill  .  amiodarone (PACERONE) 200 MG tablet Take 1 tablet by mouth two  times daily 180 tablet 3  . B Complex Vitamins (B COMPLEX 50 PO) Take 1 tablet by mouth daily.     Marland Kitchen. buPROPion (WELLBUTRIN XL) 150 MG 24 hr tablet Take 1 tablet (150 mg total) by mouth daily. 30 tablet 3  . Cholecalciferol (VITAMIN D3) 2000 UNITS TABS Take 1 tablet by mouth daily.     . dabigatran (PRADAXA) 150 MG CAPS capsule Take 1 capsule (150 mg total) by mouth 2 (two) times daily. 60 capsule 11  . diclofenac (VOLTAREN) 75 MG EC tablet Take 1 tablet (75 mg total) by mouth 2 (two) times daily. 60 tablet 5  . FERROUS GLUCONATE PO Take by mouth daily.    . furosemide (LASIX) 40 MG tablet Take 1 tablet (40 mg total) by mouth 2 (two) times daily as needed. 180 tablet 3  . gabapentin (NEURONTIN) 100 MG capsule Take 1 capsule by mouth  every 6 hours 360 capsule 1  . GLUCOSAMINE HCL-MSM PO Take 1 tablet by mouth daily.     Marland Kitchen. HYDROcodone-acetaminophen (NORCO) 10-325 MG tablet Take 1 tablet by mouth every 6 (six) hours as needed. 120 tablet 0  Reason discussed various . levothyroxine (SYNTHROID, LEVOTHROID) 25 MCG tablet Take 1 tablet (25 mcg total) by mouth daily before breakfast. 90 tablet 1  . losartan-hydrochlorothiazide (HYZAAR) 50-12.5 MG tablet Take 1 tablet by mouth  daily 90 tablet 1  . metoprolol succinate (TOPROL-XL) 25 MG 24 hr tablet Take 1 tablet (25 mg total) by mouth daily. 90 tablet 3  . Multiple Vitamin (MULTIVITAMIN) tablet Take 1 tablet by mouth daily.    . nitroGLYCERIN (NITROSTAT) 0.4 MG SL tablet Place 1 tablet (0.4 mg total) under the tongue every 5 (five) minutes as needed for chest pain. 30 tablet 3  . Potassium Chloride ER 20 MEQ TBCR Take 20 mEq by mouth 2 (two) times daily as needed. 180 tablet 3  . propranolol (INDERAL) 20 MG tablet Take 20 mg by mouth 3 (three) times daily as needed. Reported on 03/22/2016    . venlafaxine XR (EFFEXOR XR) 37.5 MG 24 hr capsule Take 1 capsule (37.5 mg total) by mouth daily with  breakfast. 30 capsule 1  . vitamin B-12 (CYANOCOBALAMIN) 500 MCG tablet Take 500 mcg by mouth daily.    . vitamin E 400 UNIT capsule Take 400 Units by mouth daily.     No current facility-administered medications for this visit.      Allergies:   Patient has no known allergies.   Social History:  The patient  reports that he has never smoked. He has never used smokeless tobacco. He reports that he drinks about 1.2 oz of alcohol per week . He reports that he does not use drugs.   Family History:   family history includes Alzheimer's disease in his father; Dementia in his mother; Diabetes in his father.    Review of Systems: Review of Systems  Constitutional: Negative.   Respiratory: Negative.   Cardiovascular: Negative.   Gastrointestinal: Negative.   Musculoskeletal: Positive for joint pain.  Neurological: Negative.   Psychiatric/Behavioral: Negative.   All other systems reviewed and are negative.    PHYSICAL EXAM: VS:  BP (!) 156/81 (BP Location: Left Arm, Patient Position: Sitting, Cuff Size: Normal)   Pulse  60   Ht 6\' 3"  (1.905 m)   Wt (!) 362 lb 12 oz (164.5 kg)   BMI 45.34 kg/m  , BMI Body mass index is 45.34 kg/m. GEN: Well nourished, well developed, in no acute distress, obese  HEENT: normal  Neck: no JVD, carotid bruits, or masses Cardiac: RRR; no murmurs, rubs, or gallops,no edema  Respiratory:  clear to auscultation bilaterally, normal work of breathing GI: soft, nontender, nondistended, + BS MS: no deformity or atrophy  Skin: warm and dry, no rash Neuro:  Strength and sensation are intact Psych: euthymic mood, full affect    Recent Labs: 09/15/2016: TSH 3.110 06/09/2017: ALT 35; BUN 24; Creatinine, Ser 0.92; Hemoglobin 13.8; Platelets 152; Potassium 3.9; Sodium 142    Lipid Panel Lab Results  Component Value Date   CHOL 183 09/15/2016   HDL 53 09/15/2016   LDLCALC 116 (H) 09/15/2016   TRIG 70 09/15/2016      Wt Readings from Last 3 Encounters:   06/14/17 (!) 362 lb 12 oz (164.5 kg)  06/09/17 (!) 361 lb (163.7 kg)  03/29/17 (!) 361 lb (163.7 kg)       ASSESSMENT AND PLAN:  Preop cardiovascular Acceptable risk for surgery, no further testing needed He will need to hold his pradaxa 2-3 days prior to surgery Consider restarting anticoagulation after his knee surgery  Atrial fibrillation, unspecified type (HCC) - Plan: EKG 12-Lead Maintaining normal sinus rhythm On amiodarone. pradaxa Recently started on thyroid supplement medication  Essential hypertension - Plan: EKG 12-Lead Blood pressure is well controlled on today's visit. No changes made to the medications.  Acute on chronic diastolic CHF (congestive heart failure) (HCC) Appears euvolemic Recommended we try to minimize IV fluids during his total knee replacement surgery   Severe obesity (BMI >= 40) (HCC) We Have discussed various strategies for weight loss Unable to exercise secondary to chronic knee pain    Total encounter time more than 25 minutes  Greater than 50% was spent in counseling and coordination of care with the patient  Disposition:   F/U  12 months   Orders Placed This Encounter  Procedures  . EKG 12-Lead     Signed, Dossie Arbour, M.D., Ph.D. 06/14/2017  [facility] [place], [place] [phone]

## 2017-06-14 ENCOUNTER — Ambulatory Visit (INDEPENDENT_AMBULATORY_CARE_PROVIDER_SITE_OTHER): Payer: 59 | Admitting: Cardiovascular Disease

## 2017-06-14 ENCOUNTER — Encounter: Payer: Self-pay | Admitting: Cardiovascular Disease

## 2017-06-14 VITALS — BP 156/81 | HR 60 | Ht 75.0 in | Wt 362.8 lb

## 2017-06-14 DIAGNOSIS — I5043 Acute on chronic combined systolic (congestive) and diastolic (congestive) heart failure: Secondary | ICD-10-CM | POA: Diagnosis not present

## 2017-06-14 DIAGNOSIS — I4819 Other persistent atrial fibrillation: Secondary | ICD-10-CM

## 2017-06-14 DIAGNOSIS — E7849 Other hyperlipidemia: Secondary | ICD-10-CM

## 2017-06-14 DIAGNOSIS — I481 Persistent atrial fibrillation: Secondary | ICD-10-CM | POA: Diagnosis not present

## 2017-06-14 DIAGNOSIS — E784 Other hyperlipidemia: Secondary | ICD-10-CM | POA: Diagnosis not present

## 2017-06-14 DIAGNOSIS — I10 Essential (primary) hypertension: Secondary | ICD-10-CM

## 2017-06-14 DIAGNOSIS — Z86718 Personal history of other venous thrombosis and embolism: Secondary | ICD-10-CM

## 2017-06-14 DIAGNOSIS — I872 Venous insufficiency (chronic) (peripheral): Secondary | ICD-10-CM

## 2017-06-14 DIAGNOSIS — R0602 Shortness of breath: Secondary | ICD-10-CM | POA: Diagnosis not present

## 2017-06-14 NOTE — Patient Instructions (Addendum)
Medication Instructions:   No medication changes made  Hold the pradaxa Friday morning until after surgery  Labwork:  No new labs needed  Testing/Procedures:  No further testing at this time   Follow-Up: It was a pleasure seeing you in the office today. Please call us if you have new issues that need to be addressed before your next appt.  [phone]  Your physician wants you to follow-up in: 12 months.  You will receive a reminder letter in the mail two months in advance. If you don't receive a letter, please call our office to schedule the follow-up appointment.  If you need a refill on your cardiac medications before your next appointment, please call your pharmacy.

## 2017-06-19 MED ORDER — DEXTROSE 5 % IV SOLN
3.0000 g | INTRAVENOUS | Status: DC
  Filled 2017-06-19: qty 3000

## 2017-06-19 MED ORDER — TRANEXAMIC ACID 1000 MG/10ML IV SOLN
1000.0000 mg | INTRAVENOUS | Status: DC
  Filled 2017-06-19: qty 10

## 2017-06-20 ENCOUNTER — Inpatient Hospital Stay: Payer: 59 | Admitting: Anesthesiology

## 2017-06-20 ENCOUNTER — Encounter
Admission: RE | Disposition: A | Discharge status: Home / Self Care | Payer: Self-pay | Source: Ambulatory Visit | Attending: Orthopedic Surgery

## 2017-06-20 ENCOUNTER — Inpatient Hospital Stay
Admission: RE | Admit: 2017-06-20 | Discharge: 2017-06-23 | DRG: 470 | Disposition: A | Discharge status: Home / Self Care | Payer: 59 | Source: Ambulatory Visit | Attending: Orthopedic Surgery | Admitting: Orthopedic Surgery

## 2017-06-20 ENCOUNTER — Inpatient Hospital Stay: Payer: 59

## 2017-06-20 ENCOUNTER — Encounter: Payer: Self-pay | Admitting: Orthopedic Surgery

## 2017-06-20 DIAGNOSIS — E669 Obesity, unspecified: Secondary | ICD-10-CM | POA: Diagnosis present

## 2017-06-20 DIAGNOSIS — Z86718 Personal history of other venous thrombosis and embolism: Secondary | ICD-10-CM

## 2017-06-20 DIAGNOSIS — Z6841 Body Mass Index (BMI) 40.0 and over, adult: Secondary | ICD-10-CM

## 2017-06-20 DIAGNOSIS — Z9119 Patient's noncompliance with other medical treatment and regimen: Secondary | ICD-10-CM

## 2017-06-20 DIAGNOSIS — M1711 Unilateral primary osteoarthritis, right knee: Principal | ICD-10-CM | POA: Diagnosis present

## 2017-06-20 DIAGNOSIS — Z7901 Long term (current) use of anticoagulants: Secondary | ICD-10-CM | POA: Diagnosis not present

## 2017-06-20 DIAGNOSIS — I5022 Chronic systolic (congestive) heart failure: Secondary | ICD-10-CM | POA: Diagnosis present

## 2017-06-20 DIAGNOSIS — G4733 Obstructive sleep apnea (adult) (pediatric): Secondary | ICD-10-CM | POA: Diagnosis present

## 2017-06-20 DIAGNOSIS — Z9884 Bariatric surgery status: Secondary | ICD-10-CM

## 2017-06-20 DIAGNOSIS — I480 Paroxysmal atrial fibrillation: Secondary | ICD-10-CM | POA: Diagnosis present

## 2017-06-20 DIAGNOSIS — Z96659 Presence of unspecified artificial knee joint: Secondary | ICD-10-CM

## 2017-06-20 DIAGNOSIS — Z79899 Other long term (current) drug therapy: Secondary | ICD-10-CM

## 2017-06-20 DIAGNOSIS — I110 Hypertensive heart disease with heart failure: Secondary | ICD-10-CM | POA: Diagnosis present

## 2017-06-20 DIAGNOSIS — E039 Hypothyroidism, unspecified: Secondary | ICD-10-CM | POA: Diagnosis present

## 2017-06-20 DIAGNOSIS — G629 Polyneuropathy, unspecified: Secondary | ICD-10-CM | POA: Diagnosis present

## 2017-06-20 HISTORY — PX: KNEE ARTHROPLASTY: SHX992

## 2017-06-20 LAB — POCT I-STAT 4, (NA,K, GLUC, HGB,HCT)
Glucose, Bld: 91 mg/dL (ref 65–99)
HCT: 40 % (ref 39.0–52.0)
HEMOGLOBIN: 13.6 g/dL (ref 13.0–17.0)
POTASSIUM: 4 mmol/L (ref 3.5–5.1)
Sodium: 141 mmol/L (ref 135–145)

## 2017-06-20 LAB — ABO/RH: ABO/RH(D): O POS

## 2017-06-20 SURGERY — ARTHROPLASTY, KNEE, TOTAL, USING IMAGELESS COMPUTER-ASSISTED NAVIGATION
Anesthesia: Spinal | Laterality: Right | Wound class: Clean

## 2017-06-20 MED ORDER — ACETAMINOPHEN 10 MG/ML IV SOLN
INTRAVENOUS | Status: DC | PRN
  Administered 2017-06-20: 1000 mg via INTRAVENOUS

## 2017-06-20 MED ORDER — METOCLOPRAMIDE HCL 10 MG PO TABS
10.0000 mg | ORAL_TABLET | Freq: Three times a day (TID) | ORAL | Status: AC
  Administered 2017-06-20 – 2017-06-22 (×7): 10 mg via ORAL
  Filled 2017-06-20 (×7): qty 1

## 2017-06-20 MED ORDER — MENTHOL 3 MG MT LOZG: 1.0000 | LOZENGE | OROMUCOSAL | Status: DC | PRN

## 2017-06-20 MED ORDER — PANTOPRAZOLE SODIUM 40 MG PO TBEC
40.0000 mg | DELAYED_RELEASE_TABLET | Freq: Two times a day (BID) | ORAL | Status: DC
  Administered 2017-06-20 – 2017-06-23 (×7): 40 mg via ORAL
  Filled 2017-06-20 (×7): qty 1

## 2017-06-20 MED ORDER — VITAMIN E 180 MG (400 UNIT) PO CAPS
400.0000 [IU] | ORAL_CAPSULE | Freq: Every day | ORAL | Status: DC
  Administered 2017-06-21 – 2017-06-23 (×3): 400 [IU] via ORAL
  Filled 2017-06-20 (×4): qty 1

## 2017-06-20 MED ORDER — SUGAMMADEX SODIUM 200 MG/2ML IV SOLN
INTRAVENOUS | Status: AC
  Filled 2017-06-20: qty 2

## 2017-06-20 MED ORDER — FENTANYL CITRATE (PF) 100 MCG/2ML IJ SOLN
25.0000 ug | INTRAMUSCULAR | Status: DC | PRN
  Administered 2017-06-20 (×5): 25 ug via INTRAVENOUS

## 2017-06-20 MED ORDER — PHENYLEPHRINE HCL 10 MG/ML IJ SOLN
INTRAMUSCULAR | Status: AC
  Filled 2017-06-20: qty 1

## 2017-06-20 MED ORDER — DIPHENHYDRAMINE HCL 12.5 MG/5ML PO ELIX: 12.5000 mg | ORAL_SOLUTION | ORAL | Status: DC | PRN

## 2017-06-20 MED ORDER — BISACODYL 10 MG RE SUPP
10.0000 mg | Freq: Every day | RECTAL | Status: DC | PRN
  Filled 2017-06-20: qty 1

## 2017-06-20 MED ORDER — PROPRANOLOL HCL 20 MG PO TABS: 20.0000 mg | ORAL_TABLET | Freq: Three times a day (TID) | ORAL | Status: DC | PRN

## 2017-06-20 MED ORDER — SUGAMMADEX SODIUM 200 MG/2ML IV SOLN
INTRAVENOUS | Status: DC | PRN
Start: 2017-06-20 — End: 2017-06-20
  Administered 2017-06-20: 200 mg via INTRAVENOUS

## 2017-06-20 MED ORDER — GABAPENTIN 100 MG PO CAPS
100.0000 mg | ORAL_CAPSULE | Freq: Four times a day (QID) | ORAL | Status: DC
  Administered 2017-06-20 – 2017-06-23 (×13): 100 mg via ORAL
  Filled 2017-06-20 (×13): qty 1

## 2017-06-20 MED ORDER — FAMOTIDINE 20 MG PO TABS
ORAL_TABLET | ORAL | Status: AC
  Filled 2017-06-20: qty 1

## 2017-06-20 MED ORDER — ONDANSETRON HCL 4 MG/2ML IJ SOLN
INTRAMUSCULAR | Status: AC
  Filled 2017-06-20: qty 2

## 2017-06-20 MED ORDER — LIDOCAINE HCL (CARDIAC) 20 MG/ML IV SOLN
INTRAVENOUS | Status: DC | PRN
Start: 2017-06-20 — End: 2017-06-20
  Administered 2017-06-20: 80 mg via INTRAVENOUS

## 2017-06-20 MED ORDER — MIDAZOLAM HCL 2 MG/2ML IJ SOLN
INTRAMUSCULAR | Status: DC | PRN
  Administered 2017-06-20: 2 mg via INTRAVENOUS

## 2017-06-20 MED ORDER — CYCLOBENZAPRINE HCL 10 MG PO TABS
10.0000 mg | ORAL_TABLET | Freq: Three times a day (TID) | ORAL | Status: DC | PRN
  Administered 2017-06-20: 10 mg via ORAL
  Filled 2017-06-20: qty 1

## 2017-06-20 MED ORDER — FUROSEMIDE 40 MG PO TABS: 40.0000 mg | ORAL_TABLET | Freq: Two times a day (BID) | ORAL | Status: DC | PRN

## 2017-06-20 MED ORDER — OXYCODONE HCL 5 MG PO TABS
5.0000 mg | ORAL_TABLET | ORAL | Status: DC | PRN
  Administered 2017-06-20: 10 mg via ORAL
  Administered 2017-06-20 (×2): 5 mg via ORAL
  Administered 2017-06-21 – 2017-06-23 (×11): 10 mg via ORAL
  Filled 2017-06-20 (×9): qty 2
  Filled 2017-06-20: qty 1
  Filled 2017-06-20 (×3): qty 2
  Filled 2017-06-20: qty 1

## 2017-06-20 MED ORDER — PROPOFOL 10 MG/ML IV BOLUS
INTRAVENOUS | Status: DC | PRN
  Administered 2017-06-20: 200 mg via INTRAVENOUS

## 2017-06-20 MED ORDER — AMIODARONE HCL 200 MG PO TABS
200.0000 mg | ORAL_TABLET | Freq: Two times a day (BID) | ORAL | Status: DC
  Administered 2017-06-20 – 2017-06-23 (×6): 200 mg via ORAL
  Filled 2017-06-20 (×6): qty 1

## 2017-06-20 MED ORDER — METOPROLOL SUCCINATE ER 25 MG PO TB24
25.0000 mg | ORAL_TABLET | Freq: Every day | ORAL | Status: DC
  Administered 2017-06-21 – 2017-06-23 (×3): 25 mg via ORAL
  Filled 2017-06-20 (×3): qty 1

## 2017-06-20 MED ORDER — FENTANYL CITRATE (PF) 250 MCG/5ML IJ SOLN
INTRAMUSCULAR | Status: AC
  Filled 2017-06-20: qty 5

## 2017-06-20 MED ORDER — HYDROMORPHONE HCL 1 MG/ML IJ SOLN
0.5000 mg | INTRAMUSCULAR | Status: DC | PRN
  Administered 2017-06-20 (×2): 0.5 mg via INTRAVENOUS

## 2017-06-20 MED ORDER — MORPHINE SULFATE (PF) 2 MG/ML IV SOLN: 2.0000 mg | INTRAVENOUS | Status: DC | PRN

## 2017-06-20 MED ORDER — HYDROCHLOROTHIAZIDE 25 MG PO TABS
25.0000 mg | ORAL_TABLET | Freq: Every day | ORAL | Status: DC
  Administered 2017-06-20 – 2017-06-23 (×4): 25 mg via ORAL
  Filled 2017-06-20 (×4): qty 1

## 2017-06-20 MED ORDER — B COMPLEX-C PO TABS
1.0000 | ORAL_TABLET | Freq: Every day | ORAL | Status: DC
  Administered 2017-06-21 – 2017-06-23 (×3): 1 via ORAL
  Filled 2017-06-20 (×4): qty 1

## 2017-06-20 MED ORDER — DEXAMETHASONE SODIUM PHOSPHATE 10 MG/ML IJ SOLN
INTRAMUSCULAR | Status: AC
  Filled 2017-06-20: qty 1

## 2017-06-20 MED ORDER — ROCURONIUM BROMIDE 50 MG/5ML IV SOLN
INTRAVENOUS | Status: AC
  Filled 2017-06-20: qty 1

## 2017-06-20 MED ORDER — MIDAZOLAM HCL 2 MG/2ML IJ SOLN
INTRAMUSCULAR | Status: AC
  Filled 2017-06-20: qty 2

## 2017-06-20 MED ORDER — FERROUS GLUCONATE 324 (38 FE) MG PO TABS
324.0000 mg | ORAL_TABLET | Freq: Every day | ORAL | Status: DC
  Administered 2017-06-20 – 2017-06-23 (×4): 324 mg via ORAL
  Filled 2017-06-20 (×4): qty 1

## 2017-06-20 MED ORDER — ONDANSETRON HCL 4 MG/2ML IJ SOLN
INTRAMUSCULAR | Status: DC | PRN
  Administered 2017-06-20: 4 mg via INTRAVENOUS

## 2017-06-20 MED ORDER — HYDROMORPHONE HCL 1 MG/ML IJ SOLN
INTRAMUSCULAR | Status: AC
  Filled 2017-06-20: qty 1

## 2017-06-20 MED ORDER — DABIGATRAN ETEXILATE MESYLATE 150 MG PO CAPS
150.0000 mg | ORAL_CAPSULE | Freq: Two times a day (BID) | ORAL | Status: DC
  Administered 2017-06-21 – 2017-06-23 (×5): 150 mg via ORAL
  Filled 2017-06-20 (×5): qty 1

## 2017-06-20 MED ORDER — ALUM & MAG HYDROXIDE-SIMETH 200-200-20 MG/5ML PO SUSP: 30.0000 mL | ORAL | Status: DC | PRN

## 2017-06-20 MED ORDER — BUPROPION HCL ER (XL) 150 MG PO TB24
150.0000 mg | ORAL_TABLET | Freq: Every day | ORAL | Status: DC
  Administered 2017-06-21 – 2017-06-23 (×3): 150 mg via ORAL
  Filled 2017-06-20 (×3): qty 1

## 2017-06-20 MED ORDER — CEFAZOLIN SODIUM-DEXTROSE 2-4 GM/100ML-% IV SOLN
2.0000 g | Freq: Four times a day (QID) | INTRAVENOUS | Status: AC
  Administered 2017-06-20 – 2017-06-21 (×4): 2 g via INTRAVENOUS
  Filled 2017-06-20 (×4): qty 100

## 2017-06-20 MED ORDER — ONDANSETRON HCL 4 MG/2ML IJ SOLN: 4.0000 mg | Freq: Once | INTRAMUSCULAR | Status: DC | PRN

## 2017-06-20 MED ORDER — ONDANSETRON HCL 4 MG PO TABS: 4.0000 mg | ORAL_TABLET | Freq: Four times a day (QID) | ORAL | Status: DC | PRN

## 2017-06-20 MED ORDER — TRAMADOL HCL 50 MG PO TABS
50.0000 mg | ORAL_TABLET | ORAL | Status: DC | PRN
Start: 2017-06-20 — End: 2017-06-23
  Administered 2017-06-21 – 2017-06-22 (×2): 100 mg via ORAL
  Filled 2017-06-20 (×2): qty 2

## 2017-06-20 MED ORDER — ACETAMINOPHEN 650 MG RE SUPP: 650.0000 mg | Freq: Four times a day (QID) | RECTAL | Status: DC | PRN

## 2017-06-20 MED ORDER — SENNOSIDES-DOCUSATE SODIUM 8.6-50 MG PO TABS
1.0000 | ORAL_TABLET | Freq: Two times a day (BID) | ORAL | Status: DC
  Administered 2017-06-20 – 2017-06-23 (×6): 1 via ORAL
  Filled 2017-06-20 (×6): qty 1

## 2017-06-20 MED ORDER — PROPOFOL 10 MG/ML IV BOLUS
INTRAVENOUS | Status: AC
  Filled 2017-06-20: qty 20

## 2017-06-20 MED ORDER — CYANOCOBALAMIN 500 MCG PO TABS
500.0000 ug | ORAL_TABLET | Freq: Every day | ORAL | Status: DC
  Administered 2017-06-20 – 2017-06-23 (×4): 500 ug via ORAL
  Filled 2017-06-20 (×4): qty 1

## 2017-06-20 MED ORDER — SODIUM CHLORIDE 0.9 % IJ SOLN
INTRAMUSCULAR | Status: AC
  Filled 2017-06-20: qty 50

## 2017-06-20 MED ORDER — DEXTROSE 5 % IV SOLN
INTRAVENOUS | Status: DC | PRN
  Administered 2017-06-20: 3 g via INTRAVENOUS

## 2017-06-20 MED ORDER — ADULT MULTIVITAMIN W/MINERALS CH
1.0000 | ORAL_TABLET | Freq: Every day | ORAL | Status: DC
  Administered 2017-06-20 – 2017-06-23 (×4): 1 via ORAL
  Filled 2017-06-20 (×4): qty 1

## 2017-06-20 MED ORDER — LACTATED RINGERS IV SOLN
INTRAVENOUS | Status: DC
  Administered 2017-06-20 (×2): via INTRAVENOUS

## 2017-06-20 MED ORDER — LOSARTAN POTASSIUM-HCTZ 100-25 MG PO TABS: 1.0000 | ORAL_TABLET | Freq: Every day | ORAL | Status: DC

## 2017-06-20 MED ORDER — VENLAFAXINE HCL ER 37.5 MG PO CP24
37.5000 mg | ORAL_CAPSULE | Freq: Every day | ORAL | Status: DC
  Administered 2017-06-21 – 2017-06-23 (×3): 37.5 mg via ORAL
  Filled 2017-06-20 (×3): qty 1

## 2017-06-20 MED ORDER — CELECOXIB 200 MG PO CAPS
200.0000 mg | ORAL_CAPSULE | Freq: Two times a day (BID) | ORAL | Status: DC
  Administered 2017-06-20 – 2017-06-23 (×7): 200 mg via ORAL
  Filled 2017-06-20 (×7): qty 1

## 2017-06-20 MED ORDER — CHLORHEXIDINE GLUCONATE 4 % EX LIQD: 60.0000 mL | Freq: Once | CUTANEOUS | Status: DC

## 2017-06-20 MED ORDER — ACETAMINOPHEN 10 MG/ML IV SOLN
1000.0000 mg | Freq: Four times a day (QID) | INTRAVENOUS | Status: AC
  Administered 2017-06-20 – 2017-06-21 (×3): 1000 mg via INTRAVENOUS
  Filled 2017-06-20 (×4): qty 100

## 2017-06-20 MED ORDER — DEXAMETHASONE SODIUM PHOSPHATE 4 MG/ML IJ SOLN
INTRAMUSCULAR | Status: DC | PRN
  Administered 2017-06-20: 6 mg via INTRAVENOUS

## 2017-06-20 MED ORDER — VITAMIN D 1000 UNITS PO TABS
2000.0000 [IU] | ORAL_TABLET | Freq: Every day | ORAL | Status: DC
  Administered 2017-06-20 – 2017-06-23 (×4): 2000 [IU] via ORAL
  Filled 2017-06-20 (×4): qty 2

## 2017-06-20 MED ORDER — ONDANSETRON HCL 4 MG/2ML IJ SOLN: 4.0000 mg | Freq: Four times a day (QID) | INTRAMUSCULAR | Status: DC | PRN

## 2017-06-20 MED ORDER — SODIUM CHLORIDE 0.9 % IV SOLN
INTRAVENOUS | Status: DC | PRN
  Administered 2017-06-20: 1000 mg via INTRAVENOUS

## 2017-06-20 MED ORDER — BUPIVACAINE HCL (PF) 0.25 % IJ SOLN
INTRAMUSCULAR | Status: DC | PRN
Start: 2017-06-20 — End: 2017-06-20
  Administered 2017-06-20: 60 mL

## 2017-06-20 MED ORDER — FENTANYL CITRATE (PF) 100 MCG/2ML IJ SOLN
INTRAMUSCULAR | Status: AC
  Administered 2017-06-20: 25 ug via INTRAVENOUS
  Filled 2017-06-20: qty 2

## 2017-06-20 MED ORDER — FENTANYL CITRATE (PF) 100 MCG/2ML IJ SOLN
INTRAMUSCULAR | Status: DC | PRN
  Administered 2017-06-20: 250 ug via INTRAVENOUS
  Administered 2017-06-20 (×2): 100 ug via INTRAVENOUS
  Administered 2017-06-20: 50 ug via INTRAVENOUS

## 2017-06-20 MED ORDER — SODIUM CHLORIDE 0.9 % IV SOLN
INTRAVENOUS | Status: DC
  Administered 2017-06-20 (×2): via INTRAVENOUS

## 2017-06-20 MED ORDER — ROCURONIUM BROMIDE 100 MG/10ML IV SOLN
INTRAVENOUS | Status: DC | PRN
  Administered 2017-06-20 (×2): 20 mg via INTRAVENOUS
  Administered 2017-06-20: 50 mg via INTRAVENOUS
  Administered 2017-06-20: 30 mg via INTRAVENOUS

## 2017-06-20 MED ORDER — MAGNESIUM HYDROXIDE 400 MG/5ML PO SUSP
30.0000 mL | Freq: Every day | ORAL | Status: DC | PRN
  Administered 2017-06-22: 30 mL via ORAL
  Filled 2017-06-20: qty 30

## 2017-06-20 MED ORDER — LEVOTHYROXINE SODIUM 25 MCG PO TABS
25.0000 ug | ORAL_TABLET | Freq: Every day | ORAL | Status: DC
  Administered 2017-06-21 – 2017-06-23 (×3): 25 ug via ORAL
  Filled 2017-06-20 (×3): qty 1

## 2017-06-20 MED ORDER — PHENOL 1.4 % MT LIQD: 1.0000 | OROMUCOSAL | Status: DC | PRN

## 2017-06-20 MED ORDER — SODIUM CHLORIDE 0.9 % IV SOLN
INTRAVENOUS | Status: DC | PRN
  Administered 2017-06-20: 60 mL

## 2017-06-20 MED ORDER — KETAMINE HCL 50 MG/ML IJ SOLN
INTRAMUSCULAR | Status: DC | PRN
  Administered 2017-06-20: 50 mg via INTRAVENOUS
  Administered 2017-06-20: 50 mg via INTRAMUSCULAR

## 2017-06-20 MED ORDER — TRANEXAMIC ACID 1000 MG/10ML IV SOLN
1000.0000 mg | Freq: Once | INTRAVENOUS | Status: AC
  Administered 2017-06-20: 1000 mg via INTRAVENOUS
  Filled 2017-06-20: qty 10

## 2017-06-20 MED ORDER — POTASSIUM CHLORIDE CRYS ER 20 MEQ PO TBCR
20.0000 meq | EXTENDED_RELEASE_TABLET | Freq: Two times a day (BID) | ORAL | Status: DC
  Administered 2017-06-20 – 2017-06-23 (×6): 20 meq via ORAL
  Filled 2017-06-20 (×6): qty 1

## 2017-06-20 MED ORDER — BUPIVACAINE LIPOSOME 1.3 % IJ SUSP
INTRAMUSCULAR | Status: AC
  Filled 2017-06-20: qty 20

## 2017-06-20 MED ORDER — BUPIVACAINE HCL (PF) 0.25 % IJ SOLN
INTRAMUSCULAR | Status: AC
  Filled 2017-06-20: qty 60

## 2017-06-20 MED ORDER — NEOMYCIN-POLYMYXIN B GU 40-200000 IR SOLN
Status: DC | PRN
  Administered 2017-06-20: 4 mL

## 2017-06-20 MED ORDER — FENTANYL CITRATE (PF) 100 MCG/2ML IJ SOLN
INTRAMUSCULAR | Status: AC
  Filled 2017-06-20: qty 2

## 2017-06-20 MED ORDER — KETAMINE HCL 50 MG/ML IJ SOLN
INTRAMUSCULAR | Status: AC
  Filled 2017-06-20: qty 10

## 2017-06-20 MED ORDER — LOSARTAN POTASSIUM 50 MG PO TABS
100.0000 mg | ORAL_TABLET | Freq: Every day | ORAL | Status: DC
  Administered 2017-06-20 – 2017-06-23 (×4): 100 mg via ORAL
  Filled 2017-06-20 (×4): qty 2

## 2017-06-20 MED ORDER — FAMOTIDINE 20 MG PO TABS
20.0000 mg | ORAL_TABLET | Freq: Once | ORAL | Status: AC
  Administered 2017-06-20: 20 mg via ORAL

## 2017-06-20 MED ORDER — ACETAMINOPHEN 10 MG/ML IV SOLN
INTRAVENOUS | Status: AC
  Filled 2017-06-20: qty 100

## 2017-06-20 MED ORDER — NEOMYCIN-POLYMYXIN B GU 40-200000 IR SOLN
Status: AC
  Filled 2017-06-20: qty 20

## 2017-06-20 MED ORDER — ACETAMINOPHEN 325 MG PO TABS: 650.0000 mg | ORAL_TABLET | Freq: Four times a day (QID) | ORAL | Status: DC | PRN

## 2017-06-20 MED ORDER — FLEET ENEMA 7-19 GM/118ML RE ENEM: 1.0000 | ENEMA | Freq: Once | RECTAL | Status: DC | PRN

## 2017-06-20 MED ORDER — LIDOCAINE HCL (PF) 2 % IJ SOLN
INTRAMUSCULAR | Status: AC
  Filled 2017-06-20: qty 2

## 2017-06-20 MED ORDER — NITROGLYCERIN 0.4 MG SL SUBL: 0.4000 mg | SUBLINGUAL_TABLET | SUBLINGUAL | Status: DC | PRN

## 2017-06-20 SURGICAL SUPPLY — 63 items
BATTERY INSTRU NAVIGATION (MISCELLANEOUS) ×12 IMPLANT
BLADE SAW 1 (BLADE) ×3 IMPLANT
BLADE SAW 1/2 (BLADE) ×3 IMPLANT
BLADE SAW 70X12.5 (BLADE) IMPLANT
BONE CEMENT GENTAMICIN (Cement) ×6 IMPLANT
CANISTER SUCT 1200ML W/VALVE (MISCELLANEOUS) ×3 IMPLANT
CANISTER SUCT 3000ML PPV (MISCELLANEOUS) ×6 IMPLANT
CAP KNEE TOTAL 3 SIGMA ×3 IMPLANT
CATH TRAY METER 16FR LF (MISCELLANEOUS) ×3 IMPLANT
CEMENT BONE GENTAMICIN 40 (Cement) ×2 IMPLANT
COOLER POLAR GLACIER W/PUMP (MISCELLANEOUS) ×3 IMPLANT
CUFF TOURN 30 N/S (MISCELLANEOUS) ×3 IMPLANT
CUFF TOURN 30 STER DUAL PORT (MISCELLANEOUS) IMPLANT
DRAPE SHEET LG 3/4 BI-LAMINATE (DRAPES) ×3 IMPLANT
DRSG DERMACEA 8X12 NADH (GAUZE/BANDAGES/DRESSINGS) ×3 IMPLANT
DRSG OPSITE POSTOP 4X14 (GAUZE/BANDAGES/DRESSINGS) ×3 IMPLANT
DRSG TEGADERM 4X4.75 (GAUZE/BANDAGES/DRESSINGS) ×3 IMPLANT
DURAPREP 26ML APPLICATOR (WOUND CARE) ×6 IMPLANT
ELECT CAUTERY BLADE 6.4 (BLADE) ×3 IMPLANT
ELECT REM PT RETURN 9FT ADLT (ELECTROSURGICAL) ×3
ELECTRODE REM PT RTRN 9FT ADLT (ELECTROSURGICAL) ×1 IMPLANT
EVACUATOR 1/8 PVC DRAIN (DRAIN) ×3 IMPLANT
EX-PIN ORTHOLOCK NAV 4X150 (PIN) ×6 IMPLANT
GLOVE BIOGEL M STRL SZ7.5 (GLOVE) ×6 IMPLANT
GLOVE BIOGEL PI IND STRL 9 (GLOVE) ×1 IMPLANT
GLOVE BIOGEL PI INDICATOR 9 (GLOVE) ×2
GLOVE INDICATOR 8.0 STRL GRN (GLOVE) ×3 IMPLANT
GLOVE SURG SYN 9.0  PF PI (GLOVE) ×2
GLOVE SURG SYN 9.0 PF PI (GLOVE) ×1 IMPLANT
GOWN STRL REUS W/ TWL LRG LVL3 (GOWN DISPOSABLE) ×2 IMPLANT
GOWN STRL REUS W/TWL 2XL LVL3 (GOWN DISPOSABLE) ×3 IMPLANT
GOWN STRL REUS W/TWL LRG LVL3 (GOWN DISPOSABLE) ×4
HOLDER FOLEY CATH W/STRAP (MISCELLANEOUS) ×3 IMPLANT
HOOD PEEL AWAY FLYTE STAYCOOL (MISCELLANEOUS) ×6 IMPLANT
KIT RM TURNOVER STRD PROC AR (KITS) ×3 IMPLANT
KNIFE SCULPS 14X20 (INSTRUMENTS) ×3 IMPLANT
LABEL OR SOLS (LABEL) ×3 IMPLANT
NDL SAFETY 18GX1.5 (NEEDLE) ×3 IMPLANT
NEEDLE SPNL 20GX3.5 QUINCKE YW (NEEDLE) ×3 IMPLANT
NS IRRIG 500ML POUR BTL (IV SOLUTION) ×3 IMPLANT
PACK TOTAL KNEE (MISCELLANEOUS) ×3 IMPLANT
PAD WRAPON POLAR KNEE (MISCELLANEOUS) ×1 IMPLANT
PIN DRILL QUICK PACK ×3 IMPLANT
PIN FIXATION 1/8DIA X 3INL (PIN) ×3 IMPLANT
PULSAVAC PLUS IRRIG FAN TIP (DISPOSABLE) ×3
SOL .9 NS 3000ML IRR  AL (IV SOLUTION) ×2
SOL .9 NS 3000ML IRR UROMATIC (IV SOLUTION) ×1 IMPLANT
SOL PREP PVP 2OZ (MISCELLANEOUS) ×3
SOLUTION PREP PVP 2OZ (MISCELLANEOUS) ×1 IMPLANT
SPONGE DRAIN TRACH 4X4 STRL 2S (GAUZE/BANDAGES/DRESSINGS) ×3 IMPLANT
STAPLER SKIN PROX 35W (STAPLE) ×3 IMPLANT
STRAP TIBIA SHORT (MISCELLANEOUS) ×3 IMPLANT
SUCTION FRAZIER HANDLE 10FR (MISCELLANEOUS) ×2
SUCTION TUBE FRAZIER 10FR DISP (MISCELLANEOUS) ×1 IMPLANT
SUT VIC AB 0 CT1 36 (SUTURE) ×3 IMPLANT
SUT VIC AB 1 CT1 36 (SUTURE) ×6 IMPLANT
SUT VIC AB 2-0 CT2 27 (SUTURE) ×3 IMPLANT
SYR 20CC LL (SYRINGE) ×3 IMPLANT
SYR 30ML LL (SYRINGE) ×6 IMPLANT
TIP FAN IRRIG PULSAVAC PLUS (DISPOSABLE) ×1 IMPLANT
TOWEL OR 17X26 4PK STRL BLUE (TOWEL DISPOSABLE) ×3 IMPLANT
TOWER CARTRIDGE SMART MIX (DISPOSABLE) ×3 IMPLANT
WRAPON POLAR PAD KNEE (MISCELLANEOUS) ×3

## 2017-06-20 NOTE — Op Note (Signed)
OPERATIVE NOTE  DATE OF SURGERY:  06/20/2017  PATIENT NAME:  Johnathan [name]   DOB: 21-Jan-1956  MRN: [id number]  PRE-OPERATIVE DIAGNOSIS: Degenerative arthrosis of the right knee, primary  POST-OPERATIVE DIAGNOSIS:  Same  PROCEDURE:  Right total knee arthroplasty using computer-assisted navigation  SURGEON:  Jena GaussJames P Jamielyn Petrucci, Jr. M.D.  ASSISTANT:  Van ClinesJon Wolfe, PA (present and scrubbed throughout the case, critical for assistance with exposure, retraction, instrumentation, and closure)  ANESTHESIA: general  ESTIMATED BLOOD LOSS: 50 mL  FLUIDS REPLACED: 1200 mL of crystalloid  TOURNIQUET TIME: 110 minutes  DRAINS: 2 medium Hemovac drains  SOFT TISSUE RELEASES: Anterior cruciate ligament, posterior cruciate ligament, deep medial collateral ligament, patellofemoral ligament  IMPLANTS UTILIZED: DePuy PFC Sigma size 5 posterior stabilized femoral component (cemented), size 5 MBT tibial component (cemented), 41 mm 3 peg oval dome patella (cemented), and a 12.5 mm stabilized rotating platform polyethylene insert.  INDICATIONS FOR SURGERY: Johnathan Dawesdward A Ardelean is a 61 y.o. year old male with a long history of progressive knee pain. X-rays demonstrated severe degenerative changes in tricompartmental fashion. The patient had not seen any significant improvement despite conservative nonsurgical intervention. After discussion of the risks and benefits of surgical intervention, the patient expressed understanding of the risks benefits and agree with plans for total knee arthroplasty.   The risks, benefits, and alternatives were discussed at length including but not limited to the risks of infection, bleeding, nerve injury, stiffness, blood clots, the need for revision surgery, cardiopulmonary complications, among others, and they were willing to proceed.  PROCEDURE IN DETAIL: The patient was brought into the operating room and, after adequate general anesthesia was achieved, a tourniquet was placed on  the patient's upper thigh. The patient's knee and leg were cleaned and prepped with alcohol and DuraPrep and draped in the usual sterile fashion. A "timeout" was performed as per usual protocol. The lower extremity was exsanguinated using an Esmarch, and the tourniquet was inflated to 300 mmHg. An anterior longitudinal incision was made followed by a standard mid vastus approach. The deep fibers of the medial collateral ligament were elevated in a subperiosteal fashion off of the medial flare of the tibia so as to maintain a continuous soft tissue sleeve. The patella was subluxed laterally and the patellofemoral ligament was incised. Inspection of the knee demonstrated severe degenerative changes with full-thickness loss of articular cartilage. Osteophytes were debrided using a rongeur. Anterior and posterior cruciate ligaments were excised. Two 4.0 mm Schanz pins were inserted in the femur and into the tibia for attachment of the array of trackers used for computer-assisted navigation. Hip center was identified using a circumduction technique. Distal landmarks were mapped using the computer. The distal femur and proximal tibia were mapped using the computer. The distal femoral cutting guide was positioned using computer-assisted navigation so as to achieve a 5 distal valgus cut. The femur was sized and it was felt that a size 5 femoral component was appropriate. A size 5 femoral cutting guide was positioned and the anterior cut was performed and verified using the computer. This was followed by completion of the posterior and chamfer cuts. Femoral cutting guide for the central box was then positioned in the center box cut was performed.  Attention was then directed to the proximal tibia. Medial and lateral menisci were excised. The extramedullary tibial cutting guide was positioned using computer-assisted navigation so as to achieve a 0 varus-valgus alignment and 0 posterior slope. The cut was performed and  verified using the computer. The  proximal tibia was sized and it was felt that a size 5 tibial tray was appropriate. Tibial and femoral trials were inserted followed by insertion of a 12.5 mm polyethylene insert. This allowed for excellent mediolateral soft tissue balancing both in flexion and in full extension. Finally, the patella was cut and prepared so as to accommodate a 41 mm 3 peg oval dome patella. A patella trial was placed and the knee was placed through a range of motion with excellent patellar tracking appreciated. The femoral trial was removed after debridement of posterior osteophytes. The central post-hole for the tibial component was reamed followed by insertion of a keel punch. Tibial trials were then removed. Cut surfaces of bone were irrigated with copious amounts of normal saline with antibiotic solution using pulsatile lavage and then suctioned dry. Polymethylmethacrylate cement with gentamicin was prepared in the usual fashion using a vacuum mixer. Cement was applied to the cut surface of the proximal tibia as well as along the undersurface of a size 5 MBT tibial component. Tibial component was positioned and impacted into place. Excess cement was removed using Personal assistant. Cement was then applied to the cut surfaces of the femur as well as along the posterior flanges of the size 5 femoral component. The femoral component was positioned and impacted into place. Excess cement was removed using Personal assistant. A 12.5 mm polyethylene trial was inserted and the knee was brought into full extension with steady axial compression applied. Finally, cement was applied to the backside of a 41 mm 3 peg oval dome patella and the patellar component was positioned and patellar clamp applied. Excess cement was removed using Personal assistant. After adequate curing of the cement, the tourniquet was deflated after a total tourniquet time of 110 minutes. Hemostasis was achieved using electrocautery. The knee  was irrigated with copious amounts of normal saline with antibiotic solution using pulsatile lavage and then suctioned dry. 20 mL of 1.3% Exparel and 60 mL of 0.25% Marcaine in 40 mL of normal saline was injected along the posterior capsule, medial and lateral gutters, and along the arthrotomy site. A 12.5 mm stabilized rotating platform polyethylene insert was inserted and the knee was placed through a range of motion with excellent mediolateral soft tissue balancing appreciated and excellent patellar tracking noted. 2 medium drains were placed in the wound bed and brought out through separate stab incisions. The medial parapatellar portion of the incision was reapproximated using interrupted sutures of #1 Vicryl. Subcutaneous tissue was approximated in layers using first #0 Vicryl followed #2-0 Vicryl. The skin was approximated with skin staples. A sterile dressing was applied.  The patient tolerated the procedure well and was transported to the recovery room in stable condition.    Caidence Higashi P. Angie Fava., M.D.

## 2017-06-20 NOTE — NC FL2 (Signed)
[place] MEDICAID FL2 LEVEL OF CARE SCREENING TOOL     IDENTIFICATION  Patient Name: Johnathan [name] Birthdate: 05-May-1956 Sex: male Admission Date (Current Location): 06/20/2017  [place] and [place] Number:  Chiropodist and Address:  [facility], [address], [place], [place] [address]      Provider Number: [id number]  Attending Physician Name and Address:  Donato Heinz, MD  Relative Name and Phone Number:       Current Level of Care: Hospital Recommended Level of Care: Skilled Nursing Facility Prior Approval Number:    Date Approved/Denied:   PASRR Number:  [id number])  Discharge Plan: SNF    Current Diagnoses: Patient Active Problem List   Diagnosis Date Noted  . S/P total knee arthroplasty 06/20/2017  . Preoperative evaluation to rule out surgical contraindication 08/02/2016  . Chronic venous insufficiency 06/23/2016  . Hypothyroidism 04/04/2016  . Long-term use of high-risk medication 03/22/2016  . Hyperlipidemia 03/22/2016  . Pain in joint, ankle and foot 12/04/2015  . Persistent atrial fibrillation (HCC)   . Acute on chronic combined systolic and diastolic heart failure (HCC) 05/21/2015  . Systolic dysfunction 05/21/2015  . Bilateral leg edema 05/21/2015  . Erythema migrans (Lyme disease) 04/08/2015  . OSA (obstructive sleep apnea)   . PAF (paroxysmal atrial fibrillation) (HCC)   . History of DVT (deep vein thrombosis)   . Obesity   . Arthritis   . B12 deficiency   . S/P left unicompartmental knee replacement 02/05/2015  . Acute on chronic diastolic CHF (congestive heart failure) (HCC) 01/08/2015  . Routine general medical examination at a health care facility 11/08/2014  . De Quervain's disease (tenosynovitis) 11/08/2014  . Severe obesity (BMI >= 40) (HCC) 08/03/2014  . Neck pain 05/13/2014  . Rotator cuff insufficiency of left shoulder 05/13/2014  . Fatigue 05/06/2014  . SOB (shortness of breath)  05/06/2014  . Essential hypertension 10/17/2013  . Polyarthritis of multiple sites 07/04/2013  . Neuropathy 07/04/2013  . S/P bariatric surgery 07/04/2013  . Other malaise and fatigue 07/10/2012  . 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 A-fib (HCC) 06/19/2012  . Palpitations 06/19/2012  . Orthopnea 06/19/2012    Orientation RESPIRATION BLADDER Height & Weight     Self, Time, Situation, Place  O2 (2 Liters Oxygen ) Continent Weight: (!) 360 lb (163.3 kg) Height:   (190.5 cm)  BEHAVIORAL SYMPTOMS/MOOD NEUROLOGICAL BOWEL NUTRITION STATUS      Continent Diet (Regular Diet )  AMBULATORY STATUS COMMUNICATION OF NEEDS Skin   Extensive Assist Verbally Surgical wounds (Incision: Right Knee )                       Personal Care Assistance Level of Assistance  Bathing, Feeding, Dressing Bathing Assistance: Limited assistance Feeding assistance: Independent Dressing Assistance: Limited assistance     Functional Limitations Info  Sight, Hearing, Speech Sight Info: Adequate Hearing Info: Adequate Speech Info: Adequate    SPECIAL CARE FACTORS FREQUENCY  PT (By licensed PT), OT (By licensed OT)     PT Frequency:  (5) OT Frequency:  (5)            Contractures      Additional Factors Info  Code Status, Allergies Code Status Info:  (Full Code. ) Allergies Info:  (No Known Allergies. )           Current Medications (06/20/2017):  This is the current hospital  active medication list Current Facility-Administered Medications  Medication Dose Route Frequency Provider Last Rate Last Dose  . 0.9 %  sodium chloride infusion   Intravenous Continuous Tanita Palinkas, Illene LabradorJames P, MD 100 mL/hr at 06/20/17 1345    . acetaminophen (OFIRMEV) IV 1,000 mg  1,000 mg Intravenous Q6H Gwen Sarvis, Illene LabradorJames P, MD      . acetaminophen (TYLENOL) tablet 650 mg  650 mg Oral Q6H PRN Blakelynn Scheeler, Illene LabradorJames P, MD       Or  . acetaminophen (TYLENOL) suppository 650 mg   650 mg Rectal Q6H PRN Siddarth Hsiung, Illene LabradorJames P, MD      . alum & mag hydroxide-simeth (MAALOX/MYLANTA) 200-200-20 MG/5ML suspension 30 mL  30 mL Oral Q4H PRN Avrum Kimball, Illene LabradorJames P, MD      . amiodarone (PACERONE) tablet 200 mg  200 mg Oral BID Maxi Carreras, Illene LabradorJames P, MD      . B-complex with vitamin C tablet 1 tablet  1 tablet Oral Daily Sharleen Szczesny, Illene LabradorJames P, MD      . bisacodyl (DULCOLAX) suppository 10 mg  10 mg Rectal Daily PRN Donato HeinzHooten, Lashawn Bromwell P, MD      . Melene Muller[START ON 06/21/2017] buPROPion (WELLBUTRIN XL) 24 hr tablet 150 mg  150 mg Oral Daily Hitomi Slape, Illene LabradorJames P, MD      . ceFAZolin (ANCEF) IVPB 2g/100 mL premix  2 g Intravenous Q6H Makynna Manocchio, Illene LabradorJames P, MD   Stopped at 06/20/17 1441  . celecoxib (CELEBREX) capsule 200 mg  200 mg Oral Q12H Tiffaney Heimann, Illene LabradorJames P, MD   200 mg at 06/20/17 1344  . cholecalciferol (VITAMIN D) tablet 2,000 Units  2,000 Units Oral Daily Taran Hable, Illene LabradorJames P, MD   2,000 Units at 06/20/17 1344  . cyanocobalamin tablet 500 mcg  500 mcg Oral Daily Emili Mcloughlin, Illene LabradorJames P, MD   500 mcg at 06/20/17 1344  . cyclobenzaprine (FLEXERIL) tablet 10 mg  10 mg Oral TID PRN Donato HeinzHooten, Terek Bee P, MD   10 mg at 06/20/17 1252  . [START ON 06/21/2017] dabigatran (PRADAXA) capsule 150 mg  150 mg Oral BID Tristan Bramble, Illene LabradorJames P, MD      . diphenhydrAMINE (BENADRYL) 12.5 MG/5ML elixir 12.5-25 mg  12.5-25 mg Oral Q4H PRN Severa Jeremiah, Illene LabradorJames P, MD      . famotidine (PEPCID) 20 MG tablet           . fentaNYL (SUBLIMAZE) 100 MCG/2ML injection           . ferrous gluconate (FERGON) tablet 324 mg  324 mg Oral Daily Meril Dray, Illene LabradorJames P, MD   324 mg at 06/20/17 1344  . furosemide (LASIX) tablet 40 mg  40 mg Oral BID PRN Cederick Broadnax, Illene LabradorJames P, MD      . gabapentin (NEURONTIN) capsule 100 mg  100 mg Oral Q6H Jaqulyn Chancellor, Illene LabradorJames P, MD   100 mg at 06/20/17 1344  . losartan (COZAAR) tablet 100 mg  100 mg Oral Daily Brailyn Killion, Illene LabradorJames P, MD   100 mg at 06/20/17 1344   And  . hydrochlorothiazide (HYDRODIURIL) tablet 25 mg  25 mg Oral Daily Regine Christian, Illene LabradorJames P, MD   25 mg at 06/20/17 1344  .  HYDROmorphone (DILAUDID) 1 MG/ML injection           . [START ON 06/21/2017] levothyroxine (SYNTHROID, LEVOTHROID) tablet 25 mcg  25 mcg Oral QAC breakfast Aliesha Dolata, Illene LabradorJames P, MD      . magnesium hydroxide (MILK OF MAGNESIA) suspension 30 mL  30 mL Oral Daily PRN Valree Feild, Illene LabradorJames P, MD      . menthol-cetylpyridinium (  CEPACOL) lozenge 3 mg  1 lozenge Oral PRN Sang Blount, Illene Labrador, MD       Or  . phenol (CHLORASEPTIC) mouth spray 1 spray  1 spray Mouth/Throat PRN Sharonna Vinje, Illene Labrador, MD      . metoCLOPramide (REGLAN) tablet 10 mg  10 mg Oral TID AC & HS Atleigh Gruen, Illene Labrador, MD      . Melene Muller ON 06/21/2017] metoprolol succinate (TOPROL-XL) 24 hr tablet 25 mg  25 mg Oral Daily Angelus Hoopes, Illene Labrador, MD      . morphine 2 MG/ML injection 2 mg  2 mg Intravenous Q2H PRN Raeqwon Lux, Illene Labrador, MD      . multivitamin with minerals tablet 1 tablet  1 tablet Oral Daily Kento Gossman, Illene Labrador, MD   1 tablet at 06/20/17 1344  . nitroGLYCERIN (NITROSTAT) SL tablet 0.4 mg  0.4 mg Sublingual Q5 min PRN Nyzir Dubois, Illene Labrador, MD      . ondansetron (ZOFRAN) tablet 4 mg  4 mg Oral Q6H PRN Tacie Mccuistion, Illene Labrador, MD       Or  . ondansetron (ZOFRAN) injection 4 mg  4 mg Intravenous Q6H PRN Kealan Buchan, Illene Labrador, MD      . oxyCODONE (Oxy IR/ROXICODONE) immediate release tablet 5-10 mg  5-10 mg Oral Q4H PRN Yessenia Maillet, Illene Labrador, MD   5 mg at 06/20/17 1252  . pantoprazole (PROTONIX) EC tablet 40 mg  40 mg Oral BID Donato Heinz, MD   40 mg at 06/20/17 1344  . potassium chloride SA (K-DUR,KLOR-CON) CR tablet 20 mEq  20 mEq Oral q12n4p Atari Novick, Illene Labrador, MD   20 mEq at 06/20/17 1344  . propranolol (INDERAL) tablet 20 mg  20 mg Oral TID PRN Jillane Po, Illene Labrador, MD      . senna-docusate (Senokot-S) tablet 1 tablet  1 tablet Oral BID Balraj Brayfield, Illene Labrador, MD      . sodium phosphate (FLEET) 7-19 GM/118ML enema 1 enema  1 enema Rectal Once PRN Antar Milks, Illene Labrador, MD      . traMADol Janean Sark) tablet 50-100 mg  50-100 mg Oral Q4H PRN Michaelia Beilfuss, Illene Labrador, MD      . Melene Muller ON 06/21/2017] venlafaxine XR  (EFFEXOR-XR) 24 hr capsule 37.5 mg  37.5 mg Oral Q breakfast Rivaldo Hineman, Illene Labrador, MD      . vitamin E capsule 400 Units  400 Units Oral Daily Amaru Burroughs, Illene Labrador, MD         Discharge Medications: Please see discharge summary for a list of discharge medications.  Relevant Imaging Results:  Relevant Lab Results:   Additional Information  (SSN: [id number])  Sample, Darleen Crocker, LCSW

## 2017-06-20 NOTE — Evaluation (Signed)
Physical Therapy Evaluation Patient Details Name: Johnathan [name] MRN: [id number] DOB: 1956/06/15 Today's Date: 06/20/2017   History of Present Illness  Pt underwent R TKR without reported post-op complications. Pt is POD#0 at time of PT evaluation. PMH includes OSA, HTN, CHF, a-fib, and hypothyroidism  Clinical Impression  Pt admitted with above diagnosis. Pt currently with functional limitations due to the deficits listed below (see PT Problem List). Pt is modified independent with bed mobility and supervision only for transfers. He is able to take short, shuffling steps from bed to recliner. Fair weight acceptance to RLE but is decreased compared to left. UE support on bariatric rolling walker. Cues and education for proper sequencing with walker. He is able to complete all exercises as instructed and is able to perform SLR and SAQ without assistance. AAROM is -4 to 83 degrees and is primarily limited by pain. Pt states that he would prefer to go to OP PT instead of HH PT. This was not previously communicated to MD. Care manager notified of patient preference. He prefers to go to [facility] for therapy however is agreeable to [facility] if needed. Care manager to follow-up with DC planning. Pt will benefit from PT services to address deficits in strength, balance, and mobility in order to return to full function at home.     Follow Up Recommendations Outpatient PT;Other (comment) (Pt prefers OP PT, prefers to go to [facility])    Equipment Recommendations  None recommended by PT    Recommendations for Other Services       Precautions / Restrictions Precautions Precautions: Knee Precaution Booklet Issued: Yes (comment) Required Braces or Orthoses: Knee Immobilizer - Right Knee Immobilizer - Right: Discontinue once straight leg raise with < 10 degree lag Restrictions Weight Bearing Restrictions: Yes RLE Weight Bearing: Weight bearing as tolerated      Mobility  Bed Mobility Overal bed  mobility: Modified Independent             General bed mobility comments: HOB elevated but does not need bed rails. Good sequencing and strength noted. No external assist  Transfers Overall transfer level: Needs assistance Equipment used: Rolling walker (2 wheeled) (Bariatric) Transfers: Sit to/from Stand Sit to Stand: Supervision         General transfer comment: Pt demonstrates fair weight acceptance to RLE. Safe hand placement and good sequencing. No external assist required by therapist. Pt is steady in standing without UE support on rolling walker  Ambulation/Gait Ambulation/Gait assistance: Min guard Ambulation Distance (Feet): 3 Feet Assistive device: Rolling walker (2 wheeled) (Bariatric) Gait Pattern/deviations: Step-to pattern Gait velocity: Decreased Gait velocity interpretation: <1.8 ft/sec, indicative of risk for recurrent falls General Gait Details: Pt able to take short, shuffling steps from bed to recliner. Fair weight acceptance to RLE but is decreased compared to left. UE support on rolling walker. Cues and education for proper sequencing  Stairs            Wheelchair Mobility    Modified Rankin (Stroke Patients Only)       Balance Overall balance assessment: Needs assistance Sitting-balance support: No upper extremity supported Sitting balance-Leahy Scale: Good     Standing balance support: No upper extremity supported Standing balance-Leahy Scale: Fair Standing balance comment: Able to maintain balance without UE support                             Pertinent Vitals/Pain Pain Assessment: 0-10 Pain Score: 6  Pain Location: R knee Pain Descriptors / Indicators: Throbbing Pain Intervention(s): Monitored during session    Home Living Family/patient expects to be discharged to:: Private residence Living Arrangements: Spouse/significant other Available Help at Discharge: Family Type of Home: House Home Access: Ramped  entrance     Home Layout: One level Home Equipment: Cane - single point;Walker - 2 wheels (bariatric walker, lift recliner, no O2, no hospital bed)      Prior Function Level of Independence: Independent with assistive device(s)         Comments: Intermittent use of quad cane for full community ambulation. Independent with ADLs/IADLs. Works fulltime as a Chartered certified accountantmachinist. 6-7 falls in the last 12 months due to RLE buckling     Hand Dominance   Dominant Hand: Right    Extremity/Trunk Assessment   Upper Extremity Assessment Upper Extremity Assessment: Overall WFL for tasks assessed    Lower Extremity Assessment Lower Extremity Assessment: RLE deficits/detail RLE Deficits / Details: Able to perform SLR and SAQ witout assist. Full DF/PF. Reports slight numbness around knee but otherwise sensation intact. LLE strength appears grossly WFL       Communication   Communication: No difficulties  Cognition Arousal/Alertness: Awake/alert Behavior During Therapy: WFL for tasks assessed/performed Overall Cognitive Status: Within Functional Limits for tasks assessed                                        General Comments      Exercises Total Joint Exercises Ankle Circles/Pumps: AROM;Both;10 reps;Supine Quad Sets: Strengthening;Both;10 reps;Supine Gluteal Sets: Strengthening;Both;10 reps;Supine Towel Squeeze: Strengthening;Both;10 reps;Supine Short Arc Quad: Strengthening;Right;10 reps;Supine Heel Slides: Strengthening;Right;10 reps;Supine Hip ABduction/ADduction: Strengthening;Right;10 reps;Supine Straight Leg Raises: Strengthening;Right;10 reps;Supine Goniometric ROM: -4 to 83 degrees AAROM, pain limited   Assessment/Plan    PT Assessment Patient needs continued PT services  PT Problem List Decreased strength;Decreased range of motion;Decreased activity tolerance;Decreased balance;Decreased mobility;Pain;Obesity       PT Treatment Interventions DME  instruction;Gait training;Stair training;Functional mobility training;Therapeutic activities;Therapeutic exercise;Balance training;Neuromuscular re-education;Patient/family education;Manual techniques    PT Goals (Current goals can be found in the Care Plan section)  Acute Rehab PT Goals Patient Stated Goal: Return to prior function at home PT Goal Formulation: With patient Time For Goal Achievement: 07/04/17 Potential to Achieve Goals: Good    Frequency BID   Barriers to discharge        Co-evaluation               AM-PAC PT "6 Clicks" Daily Activity  Outcome Measure Difficulty turning over in bed (including adjusting bedclothes, sheets and blankets)?: A Little Difficulty moving from lying on back to sitting on the side of the bed? : A Little Difficulty sitting down on and standing up from a chair with arms (e.g., wheelchair, bedside commode, etc,.)?: A Little Help needed moving to and from a bed to chair (including a wheelchair)?: A Little Help needed walking in hospital room?: A Little Help needed climbing 3-5 steps with a railing? : A Lot 6 Click Score: 17    End of Session Equipment Utilized During Treatment: Gait belt Activity Tolerance: Patient tolerated treatment well Patient left: in chair;with call bell/phone within reach;with chair alarm set;with nursing/sitter in room;with SCD's reapplied;Other (comment) (polar care in place and towel roll under heel) Nurse Communication: Mobility status PT Visit Diagnosis: Unsteadiness on feet (R26.81);Muscle weakness (generalized) (M62.81);Pain Pain - Right/Left: Right Pain - part  of body: Knee    Time: 1523-1600 PT Time Calculation (min) (ACUTE ONLY): 37 min   Charges:   PT Evaluation $PT Eval Low Complexity: 1 Low PT Treatments $Therapeutic Exercise: 8-22 mins   PT G Codes:   PT G-Codes **NOT FOR INPATIENT CLASS** Functional Assessment Tool Used: AM-PAC 6 Clicks Basic Mobility Functional Limitation: Mobility:  Walking and moving around Mobility: Walking and Moving Around Current Status (Z6109): At least 40 percent but less than 60 percent impaired, limited or restricted Mobility: Walking and Moving Around Goal Status [phone]): At least 1 percent but less than 20 percent impaired, limited or restricted    Lynnea Maizes PT, DPT    Jordyn Hofacker 06/20/2017, 4:11 PM

## 2017-06-20 NOTE — Care Management Note (Addendum)
Case Management Note  Patient Details  Name: Johnathan [name] MRN: [id number] Date of Birth: [birth year]  Subjective/Objective:  Case discussed with PT. Patient wishes to do OP PT and therapist is in agreement. Patient has no choice of OP agencies. Appointment scheduled for Thursday, Sept. 13 at 2:00 pm.  Will update patient. Placed on follow up papers. Patient states he has a walker. He would like to have me follow up with him tomorrow regarding which pharmacy he would like to use.                     Action/Plan[facility] No DME needs. Follow up tomorrow regarding pharmacy choice.  Expected Discharge Date:                  Expected Discharge Plan:  OP Rehab  In-House Referral:     Discharge planning Services  CM Consult  Post Acute Care Choice:    Choice offered to:  Patient  DME Arranged:    DME Agency:     HH Arranged:    HH Agency:     Status of Service:  In process, will continue to follow  If discussed at Long Length of Stay Meetings, dates discussed:    Additional Comments:  Marily MemosLisa M Omkar Stratmann, RN 06/20/2017, 4:12 PM

## 2017-06-20 NOTE — Anesthesia Procedure Notes (Signed)
Procedure Name: Intubation Date/Time: 06/20/2017 7:26 AM Performed by: Rosaria Ferries, Anatole Apollo Pre-anesthesia Checklist: Patient identified, Emergency Drugs available, Suction available and Patient being monitored Patient Re-evaluated:Patient Re-evaluated prior to induction Oxygen Delivery Method: Circle system utilized Preoxygenation: Pre-oxygenation with 100% oxygen Induction Type: IV induction Laryngoscope Size: Mac and 3 Grade View: Grade I Tube type: Oral Tube size: 7.0 mm Number of attempts: 1 Placement Confirmation: ETT inserted through vocal cords under direct vision,  positive ETCO2 and breath sounds checked- equal and bilateral Secured at: 23 cm Tube secured with: Tape Dental Injury: Teeth and Oropharynx as per pre-operative assessment

## 2017-06-20 NOTE — Transfer of Care (Signed)
Immediate Anesthesia Transfer of Care Note  Patient: Johnathan [name]  Procedure(s) Performed: Procedure(s): COMPUTER ASSISTED TOTAL KNEE ARTHROPLASTY (Right)  Patient Location: PACU  Anesthesia Type:General  Level of Consciousness: awake, alert , oriented and patient cooperative  Airway & Oxygen Therapy: Patient Spontanous Breathing and Patient connected to face mask oxygen  Post-op Assessment: Report given to RN and Post -op Vital signs reviewed and stable  Post vital signs: Reviewed and stable  Last Vitals:  Vitals:   06/20/17 0630  BP: (!) 143/91  Pulse: 62  Resp: 20  Temp: 36.5 C  SpO2: 98%    Last Pain:  Vitals:   06/20/17 0630  TempSrc: Oral  PainSc: 6       Patients Stated Pain Goal: 2 (06/20/17 0630)  Complications: No apparent anesthesia complications

## 2017-06-20 NOTE — H&P (Signed)
The patient has been re-examined, and the chart reviewed, and there have been no interval changes to the documented history and physical.    The risks, benefits, and alternatives have been discussed at length. The patient expressed understanding of the risks benefits and agreed with plans for surgical intervention.  James P. Hooten, Jr. M.D.    

## 2017-06-20 NOTE — Anesthesia Preprocedure Evaluation (Addendum)
Anesthesia Evaluation  Patient identified by MRN, date of birth, ID band Patient awake    Reviewed: Allergy & Precautions, NPO status , Patient's Chart, lab work & pertinent test results, reviewed documented beta blocker date and time   Airway Mallampati: III  TM Distance: >3 FB     Dental  (+) Chipped   Pulmonary sleep apnea ,           Cardiovascular hypertension, Pt. on medications and Pt. on home beta blockers +CHF and + Orthopnea  + dysrhythmias Atrial Fibrillation      Neuro/Psych    GI/Hepatic   Endo/Other  Hypothyroidism   Renal/GU      Musculoskeletal  (+) Arthritis ,   Abdominal   Peds  Hematology   Anesthesia Other Findings Obesity Has been off of pradaxa for over 3 days.  Reproductive/Obstetrics                            Anesthesia Physical Anesthesia Plan  ASA: III  Anesthesia Plan: Spinal   Post-op Pain Management:    Induction:   PONV Risk Score and Plan:   Airway Management Planned:   Additional Equipment:   Intra-op Plan:   Post-operative Plan:   Informed Consent: I have reviewed the patients History and Physical, chart, labs and discussed the procedure including the risks, benefits and alternatives for the proposed anesthesia with the patient or authorized representative who has indicated his/her understanding and acceptance.     Plan Discussed with: CRNA  Anesthesia Plan Comments:         Anesthesia Quick Evaluation

## 2017-06-20 NOTE — Anesthesia Post-op Follow-up Note (Signed)
Anesthesia QCDR form completed.        

## 2017-06-21 LAB — BASIC METABOLIC PANEL
ANION GAP: 7 (ref 5–15)
BUN: 22 mg/dL — ABNORMAL HIGH (ref 6–20)
CO2: 26 mmol/L (ref 22–32)
Calcium: 8.3 mg/dL — ABNORMAL LOW (ref 8.9–10.3)
Chloride: 104 mmol/L (ref 101–111)
Creatinine, Ser: 0.87 mg/dL (ref 0.61–1.24)
GLUCOSE: 108 mg/dL — AB (ref 65–99)
POTASSIUM: 4 mmol/L (ref 3.5–5.1)
Sodium: 137 mmol/L (ref 135–145)

## 2017-06-21 LAB — CBC
HEMATOCRIT: 36.7 % — AB (ref 40.0–52.0)
Hemoglobin: 12.6 g/dL — ABNORMAL LOW (ref 13.0–18.0)
MCH: 30.9 pg (ref 26.0–34.0)
MCHC: 34.3 g/dL (ref 32.0–36.0)
MCV: 90.1 fL (ref 80.0–100.0)
PLATELETS: 153 10*3/uL (ref 150–440)
RBC: 4.07 MIL/uL — AB (ref 4.40–5.90)
RDW: 14 % (ref 11.5–14.5)
WBC: 11.3 10*3/uL — AB (ref 3.8–10.6)

## 2017-06-21 MED ORDER — TRAMADOL HCL 50 MG PO TABS: 50.0000 mg | ORAL_TABLET | ORAL | 0 refills | Status: DC | PRN

## 2017-06-21 MED ORDER — OXYCODONE HCL 5 MG PO TABS: 5.0000 mg | ORAL_TABLET | ORAL | 0 refills | Status: DC | PRN

## 2017-06-21 NOTE — Care Management Note (Signed)
Case Management Note  Patient Details  Name: Colbert A Henken MRN: [name] Date of Birth: 11-06-1955  Subjective/Objective:  Pharmacy: [facility]- [phone]. Called Lovenox 40 mg # 14 no refills.                   Action/Plan:   Expected Discharge Date:                  Expected Discharge Plan:  OP Rehab  In-House Referral:     Discharge planning Services  CM Consult  Post Acute Care Choice:    Choice offered to:  Patient  DME Arranged:    DME Agency:     HH Arranged:    HH Agency:     Status of Service:  In process, will continue to follow  If discussed at Long Length of Stay Meetings, dates discussed:    Additional Comments:  Marily MemosLisa M Ayako Tapanes, RN 06/21/2017, 8:50 AM

## 2017-06-21 NOTE — Progress Notes (Signed)
Clinical Social Worker (CSW) received SNF consult. PT is recommending outpatient PT. RN case manager aware of above. Please reconsult if future social work needs arise. CSW signing off.   Jakaree Pickard, LCSW [phone]  

## 2017-06-21 NOTE — Anesthesia Postprocedure Evaluation (Signed)
Anesthesia Post Note  Patient: Johnathan [name]  Procedure(s) Performed: Procedure(s) (LRB): COMPUTER ASSISTED TOTAL KNEE ARTHROPLASTY (Right)  Patient location during evaluation: Nursing Unit Anesthesia Type: Spinal Level of consciousness: awake and alert and oriented Pain management: pain level controlled Vital Signs Assessment: post-procedure vital signs reviewed and stable Respiratory status: spontaneous breathing and respiratory function stable Cardiovascular status: stable Postop Assessment: patient able to bend at knees, no signs of nausea or vomiting and adequate PO intake Anesthetic complications: no     Last Vitals:  Vitals:   06/20/17 2025 06/21/17 0015  BP: (!) 107/54 131/64  Pulse: 65 (!) 56  Resp: 19 18  Temp: 36.9 C 37.2 C  SpO2: 94% 96%    Last Pain:  Vitals:   06/21/17 0605  TempSrc:   PainSc: Stephan MinisterAsleep                 Rozetta Stumpp,  Zurii Hewes F

## 2017-06-21 NOTE — Progress Notes (Signed)
Physical Therapy Treatment Patient Details Name: Johnathan [name] MRN: [id number] DOB: [birth year] Today's Date: 06/21/2017    History of Present Illness Pt underwent R TKR without reported post-op complications. Pt is POD#1 at time of OT evaluation. PMH includes OSA, HTN, CHF, a-fib, and hypothyroidism    PT Comments    Participated in exercises as described below.  Stood with increased time but no physical assist to walker.  Pt was able to ambulate 10' with walker and step to pattern.  Labored gait.  Pt stated he was able to ambulate to/from bathroom this am and did well.  Stated generally fatigued this session and increased pain.    Will measure knee flex/ext this pm when pt is on bed.  Was unable to effectively measure in chair.   Follow Up Recommendations  Outpatient PT;Other (comment)     Equipment Recommendations  None recommended by PT    Recommendations for Other Services       Precautions / Restrictions Precautions Precautions: Knee Required Braces or Orthoses: Knee Immobilizer - Right Knee Immobilizer - Right: Discontinue once straight leg raise with < 10 degree lag Restrictions Weight Bearing Restrictions: Yes RLE Weight Bearing: Weight bearing as tolerated Other Position/Activity Restrictions: Pt able to complete 10 SLR's today    Mobility  Bed Mobility Overal bed mobility: Modified Independent             General bed mobility comments: in recliner  Transfers Overall transfer level: Needs assistance Equipment used: Rolling walker (2 wheeled) Transfers: Sit to/from Stand Sit to Stand: Supervision         General transfer comment: no physical assist, good safety awareness, increased time  Ambulation/Gait Ambulation/Gait assistance: Min guard Ambulation Distance (Feet): 10 Feet Assistive device: Rolling walker (2 wheeled) Gait Pattern/deviations: Step-to pattern Gait velocity: Decreased Gait velocity interpretation: <1.8 ft/sec, indicative of  risk for recurrent falls General Gait Details: Improved gait but fatigued quickly this session.   Stairs            Wheelchair Mobility    Modified Rankin (Stroke Patients Only)       Balance Overall balance assessment: Needs assistance Sitting-balance support: No upper extremity supported Sitting balance-Leahy Scale: Good     Standing balance support: No upper extremity supported Standing balance-Leahy Scale: Fair Standing balance comment: Able to maintain balance without UE support - static                            Cognition Arousal/Alertness: Awake/alert Behavior During Therapy: WFL for tasks assessed/performed Overall Cognitive Status: Within Functional Limits for tasks assessed                                        Exercises Total Joint Exercises Ankle Circles/Pumps: AROM;Both;10 reps;Supine Quad Sets: Strengthening;Both;10 reps;Supine Heel Slides: Strengthening;Right;10 reps;Supine Hip ABduction/ADduction: Strengthening;Right;10 reps;Supine Straight Leg Raises: Strengthening;Right;10 reps;Supine Long Arc Quad: AROM;Right;Seated;10 reps Goniometric ROM: deferred this session as he remained up in chair and was an ineffective place to measure due to height, will measure this pm on bed. Other Exercises Other Exercises: pt educated in home/routine modifications to maximize safety and functional independence. Pt verbalized understanding.    General Comments        Pertinent Vitals/Pain Pain Assessment: 0-10 Pain Score: 7  Pain Location: R knee Pain Descriptors / Indicators: Aching;Grimacing;Guarding Pain Intervention(s): Limited activity  within patient's tolerance;Ice applied    Home Living Family/patient expects to be discharged to:: Private residence Living Arrangements: Spouse/significant other Available Help at Discharge: Family Type of Home: House Home Access: Ramped entrance   Home Layout: One level Home Equipment:  Cane - quad;Other (comment) (bari 2WW, lift recliner)      Prior Function Level of Independence: Independent with assistive device(s)      Comments: Intermittent use of quad cane for full community ambulation. Independent with ADLs/IADLs. Works fulltime as a Chartered certified accountantmachinist. 6-7 falls in the last 12 months due to RLE buckling.   PT Goals (current goals can now be found in the care plan section) Acute Rehab PT Goals Patient Stated Goal: Return to prior function at home Progress towards PT goals: Progressing toward goals    Frequency    BID      PT Plan Current plan remains appropriate    Co-evaluation              AM-PAC PT "6 Clicks" Daily Activity  Outcome Measure  Difficulty turning over in bed (including adjusting bedclothes, sheets and blankets)?: A Little Difficulty moving from lying on back to sitting on the side of the bed? : A Little Difficulty sitting down on and standing up from a chair with arms (e.g., wheelchair, bedside commode, etc,.)?: A Little Help needed moving to and from a bed to chair (including a wheelchair)?: A Little Help needed walking in hospital room?: A Little Help needed climbing 3-5 steps with a railing? : A Lot 6 Click Score: 17    End of Session Equipment Utilized During Treatment: Gait belt Activity Tolerance: Patient tolerated treatment well Patient left: in chair;with chair alarm set;with call bell/phone within reach   Pain - Right/Left: Right Pain - part of body: Knee     Time: 1012-1032 PT Time Calculation (min) (ACUTE ONLY): 20 min  Charges:  $Gait Training: 8-22 mins                    G Codes:       Danielle DessSarah Keltin Baird, PTA 06/21/17, 10:48 AM

## 2017-06-21 NOTE — Discharge Summary (Signed)
Physician Discharge Summary  Patient ID: Johnathan [name] MRN: [id number] DOB/AGE: August 26, 1956 61 y.o.  Admit date: 06/20/2017 Discharge date: 06/23/2017  Admission Diagnoses:  PRIMARY OSTEOARTHRITIS OF RIGHT KNEE   Discharge Diagnoses: Patient Active Problem List   Diagnosis Date Noted  . S/P total knee arthroplasty 06/20/2017  . Preoperative evaluation to rule out surgical contraindication 08/02/2016  . Chronic venous insufficiency 06/23/2016  . Hypothyroidism 04/04/2016  . Long-term use of high-risk medication 03/22/2016  . Hyperlipidemia 03/22/2016  . Pain in joint, ankle and foot 12/04/2015  . Persistent atrial fibrillation (HCC)   . Acute on chronic combined systolic and diastolic heart failure (HCC) 05/21/2015  . Systolic dysfunction 05/21/2015  . Bilateral leg edema 05/21/2015  . Erythema migrans (Lyme disease) 04/08/2015  . OSA (obstructive sleep apnea)   . PAF (paroxysmal atrial fibrillation) (HCC)   . History of DVT (deep vein thrombosis)   . Obesity   . Arthritis   . B12 deficiency   . S/P left unicompartmental knee replacement 02/05/2015  . Acute on chronic diastolic CHF (congestive heart failure) (HCC) 01/08/2015  . Routine general medical examination at a health care facility 11/08/2014  . De Quervain's disease (tenosynovitis) 11/08/2014  . Severe obesity (BMI >= 40) (HCC) 08/03/2014  . Neck pain 05/13/2014  . Rotator cuff insufficiency of left shoulder 05/13/2014  . Fatigue 05/06/2014  . SOB (shortness of breath) 05/06/2014  . Essential hypertension 10/17/2013  . Polyarthritis of multiple sites 07/04/2013  . Neuropathy 07/04/2013  . S/P bariatric surgery 07/04/2013  . Other malaise and fatigue 07/10/2012  . 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 A-fib (HCC) 06/19/2012  . Palpitations 06/19/2012  . Orthopnea 06/19/2012    Past Medical History:  Diagnosis Date  . Arthritis    a. chronic joint pain   . B12 deficiency   . Chronic systolic CHF (congestive heart failure) (HCC)    a. echo 2013: EF of 50-55%, normal right ventricular systolic pressure, normal left atrium; b. EF 40-45%, inadequate for LV wall motion, mild MR, LA severely dilated @ 52 mm, PASP nl  . History of DVT (deep vein thrombosis) 2004   s/p bariatric surgery  . History of stress test    a. there was no ST segment deviation noted during stress,    There is a small defect of moderate severity present in the apex location, suggestive of apical ischemia, intermediate risk, calculated EF 21% but visually appears to be 35-40%  . Hypertension   . Hypothyroidism   . Neuropathy   . Obesity   . OSA (obstructive sleep apnea)    a. not compliant with CPAP  . PAF (paroxysmal atrial fibrillation) (HCC)    a. CHADSVASc at least 2 (HTN and vascular disease); b. on Eliquis      Transfusion: No transfusions during this admission   Consultants (if any):   Discharged Condition: Improved  Hospital Course: Johnathan [name] is an 61 y.o. male who was admitted 06/20/2017 with a diagnosis of degenerative arthrosis right knee and went to the operating room on 06/20/2017 and underwent the above named procedures.    Surgeries:Procedure(s): COMPUTER ASSISTED TOTAL KNEE ARTHROPLASTY on 06/20/2017  PRE-OPERATIVE DIAGNOSIS: Degenerative arthrosis of the right knee, primary  POST-OPERATIVE DIAGNOSIS:  Same  PROCEDURE:  Right total knee arthroplasty using computer-assisted navigation  SURGEON:  Jena Gauss. M.D.  ASSISTANT:  Van Clines, PA (present and scrubbed throughout the case, critical for assistance with exposure,  retraction, instrumentation, and closure)  ANESTHESIA: general  ESTIMATED BLOOD LOSS: 50 mL  FLUIDS REPLACED: 1200 mL of crystalloid  TOURNIQUET TIME: 110 minutes  DRAINS: 2 medium Hemovac drains  SOFT TISSUE RELEASES: Anterior cruciate ligament, posterior cruciate ligament, deep medial collateral  ligament, patellofemoral ligament  IMPLANTS UTILIZED: DePuy PFC Sigma size 5 posterior stabilized femoral component (cemented), size 5 MBT tibial component (cemented), 41 mm 3 peg oval dome patella (cemented), and a 12.5 mm stabilized rotating platform polyethylene insert.  INDICATIONS FOR SURGERY: Johnathan [name] is a 61 y.o. year old male with a long history of progressive knee pain. X-rays demonstrated severe degenerative changes in tricompartmental fashion. The patient had not seen any significant improvement despite conservative nonsurgical intervention. After discussion of the risks and benefits of surgical intervention, the patient expressed understanding of the risks benefits and agree with plans for total knee arthroplasty.   The risks, benefits, and alternatives were discussed at length including but not limited to the risks of infection, bleeding, nerve injury, stiffness, blood clots, the need for revision surgery, cardiopulmonary complications, among others, and they were willing to proceed. Patient tolerated the surgery well. No complications .Patient was taken to PACU where she was stabilized and then transferred to the orthopedic floor.  Patient started on a Pradaxa 150 mg every 12 hours. Foot pumps applied bilaterally at 80 mm hgb. Heels elevated off bed with rolled towels. No evidence of DVT. Calves non tender. Negative Homan. Physical therapy started on day #1 for gait training and transfer with OT starting on  day #1 for ADL and assisted devices. Patient has done well with therapy. Ambulated greater than 200 feet upon being discharged. Was able to ascend and descend 4 steps safely and independently  Patient's IV And Foley were discontinued on day #1 with Hemovac being discontinued on day #2. Dressing was changed on day 2 prior to patient being discharged   He was given perioperative antibiotics:  Anti-infectives    Start     Dose/Rate Route Frequency Ordered Stop   06/20/17  1400  ceFAZolin (ANCEF) IVPB 2g/100 mL premix     2 g 200 mL/hr over 30 Minutes Intravenous Every 6 hours 06/20/17 1243 06/21/17 1359   06/20/17 0600  ceFAZolin (ANCEF) 3 g in dextrose 5 % 50 mL IVPB  Status:  Discontinued     3 g 130 mL/hr over 30 Minutes Intravenous On call to O.R. 06/19/17 2208 06/20/17 1610    .  He was fitted with AV 1 compression foot pump devices, instructed on heel pumps, early ambulation, and fitted with TED stockings bilaterally for DVT prophylaxis.  He benefited maximally from the hospital stay and there were no complications.    Recent vital signs:  Vitals:   06/20/17 2025 06/21/17 0015  BP: (!) 107/54 131/64  Pulse: 65 (!) 56  Resp: 19 18  Temp: 98.5 F (36.9 C) 98.9 F (37.2 C)  SpO2: 94% 96%    Recent laboratory studies:  Lab Results  Component Value Date   HGB 12.6 (L) 06/21/2017   HGB 13.6 06/20/2017   HGB 13.8 06/09/2017   Lab Results  Component Value Date   WBC 11.3 (H) 06/21/2017   PLT 153 06/21/2017   Lab Results  Component Value Date   INR 1.21 06/09/2017   Lab Results  Component Value Date   NA 137 06/21/2017   K 4.0 06/21/2017   CL 104 06/21/2017   CO2 26 06/21/2017   BUN 22 (H) 06/21/2017  CREATININE 0.87 06/21/2017   GLUCOSE 108 (H) 06/21/2017    Discharge Medications:   Allergies as of 06/21/2017   No Known Allergies     Medication List    STOP taking these medications   diclofenac 75 MG EC tablet Commonly known as:  VOLTAREN     TAKE these medications   amiodarone 200 MG tablet Commonly known as:  PACERONE Take 1 tablet (200 mg total) by mouth 2 (two) times daily.   B COMPLEX 50 PO Take 1 tablet by mouth daily.   buPROPion 150 MG 24 hr tablet Commonly known as:  WELLBUTRIN XL TAKE 1 TABLET (150 MG TOTAL) BY MOUTH DAILY.   cyclobenzaprine 10 MG tablet Commonly known as:  FLEXERIL Take 1 tablet (10 mg total) by mouth 3 (three) times daily as needed for muscle spasms.   dabigatran 150 MG Caps  capsule Commonly known as:  PRADAXA Take 1 capsule (150 mg total) by mouth 2 (two) times daily.   DULCOLAX 100 MG capsule Generic drug:  docusate sodium Take 400 mg by mouth daily.   Ferrous Gluconate 324 (37.5 Fe) MG Tabs Take 1 tablet by mouth daily.   furosemide 40 MG tablet Commonly known as:  LASIX Take 1 tablet (40 mg total) by mouth 2 (two) times daily as needed.   gabapentin 100 MG capsule Commonly known as:  NEURONTIN Take 1 capsule by mouth  every 6 hours   GLUCOSAMINE HCL-MSM PO Take 1 tablet by mouth daily.   HYDROcodone-acetaminophen 10-325 MG tablet Commonly known as:  NORCO Take 1 tablet by mouth every 6 (six) hours as needed.   levothyroxine 25 MCG tablet Commonly known as:  SYNTHROID, LEVOTHROID TAKE 1 TABLET BY MOUTH  DAILY BEFORE BREAKFAST   losartan-hydrochlorothiazide 100-25 MG tablet Commonly known as:  HYZAAR Take 1 tablet by mouth daily.   metoprolol succinate 25 MG 24 hr tablet Commonly known as:  TOPROL-XL TAKE 1 TABLET (25 MG TOTAL) BY MOUTH DAILY.   multivitamin tablet Take 1 tablet by mouth daily.   nitroGLYCERIN 0.4 MG SL tablet Commonly known as:  NITROSTAT Place 1 tablet (0.4 mg total) under the tongue every 5 (five) minutes as needed for chest pain.   oxyCODONE 5 MG immediate release tablet Commonly known as:  Oxy IR/ROXICODONE Take 1-2 tablets (5-10 mg total) by mouth every 4 (four) hours as needed for severe pain or breakthrough pain.   Potassium Chloride ER 20 MEQ Tbcr Take 20 mEq by mouth 2 (two) times daily as needed.   propranolol 20 MG tablet Commonly known as:  INDERAL Take 20 mg by mouth 3 (three) times daily as needed. Reported on 03/22/2016   traMADol 50 MG tablet Commonly known as:  ULTRAM Take 1-2 tablets (50-100 mg total) by mouth every 4 (four) hours as needed for moderate pain.   venlafaxine XR 37.5 MG 24 hr capsule Commonly known as:  EFFEXOR XR Take 1 capsule (37.5 mg total) by mouth daily with  breakfast.   vitamin B-12 500 MCG tablet Commonly known as:  CYANOCOBALAMIN Take 500 mcg by mouth daily.   Vitamin D3 2000 units Tabs Take 1 tablet by mouth daily.   vitamin E 400 UNIT capsule Take 400 Units by mouth daily.            Durable Medical Equipment        Start     Ordered   06/20/17 1244  DME Walker rolling  Once    Question:  Patient needs  a walker to treat with the following condition  Answer:  Total knee replacement status   06/20/17 1243   06/20/17 1244  DME Bedside commode  Once    Question:  Patient needs a bedside commode to treat with the following condition  Answer:  Total knee replacement status   06/20/17 1243       Discharge Care Instructions        Start     Ordered   06/21/17 0000  oxyCODONE (OXY IR/ROXICODONE) 5 MG immediate release tablet  Every 4 hours PRN     06/21/17 0746   06/21/17 0000  traMADol (ULTRAM) 50 MG tablet  Every 4 hours PRN     06/21/17 0746   06/21/17 0000  Increase activity slowly     06/21/17 0746   06/21/17 0000  Diet - low sodium heart healthy     06/21/17 0746      Diagnostic Studies: Dg Knee Right Port  Result Date: 06/20/2017 CLINICAL DATA:  Status post total knee replaced EXAM: PORTABLE RIGHT KNEE - 1-2 VIEW COMPARISON:  None. FINDINGS: Right total knee arthroplasty in satisfactory position. No fracture or dislocation is seen. Associated surgical drain. Overlying skin staples. IMPRESSION: Right total knee arthroplasty in satisfactory position. Electronically Signed   By: Charline BillsSriyesh  Krishnan M.D.   On: 06/20/2017 11:33    Disposition: 01-Home or Self Care  Discharge Instructions    Diet - low sodium heart healthy    Complete by:  As directed    Increase activity slowly    Complete by:  As directed       Follow-up Information    Tera PartridgeWolfe, [place] Depaz R, PA Follow up on 07/05/2017.   Specialty:  Physician Assistant Why:  at 9:45am Contact information: [address] [facility] [place] [address] [phone]        Donato HeinzHooten, James P, MD Follow up on 08/02/2017.   Specialty:  Orthopedic Surgery Why:  at 9:30am Contact information: [address] [facility] [place] [address] [phone]        [facility] Go on 06/23/2017.   Why:  First outpatient physical therpay appointment at 2:00 pm  Contact information: [address] [facility] [place] [address] [phone]            Signed: Tera PartridgeWOLFE,Joshwa Hemric R. 06/21/2017, 7:47 AM

## 2017-06-21 NOTE — Progress Notes (Signed)
Subjective: 1 Day Post-Op Procedure(s) (LRB): COMPUTER ASSISTED TOTAL KNEE ARTHROPLASTY (Right) Patient reports pain as 6 on 0-10 scale.   Patient is well, and has had no acute complaints or problems We will start therapy today.  Plan is to go Home after hospital stay. no nausea and no vomiting Patient denies any chest pains or shortness of breath. Objective: Vital signs in last 24 hours: Temp:  [97.5 F (36.4 C)-98.9 F (37.2 C)] 98.9 F (37.2 C) (09/11 0015) Pulse Rate:  [55-70] 56 (09/11 0015) Resp:  [10-19] 18 (09/11 0015) BP: (107-154)/(54-103) 131/64 (09/11 0015) SpO2:  [94 %-100 %] 96 % (09/11 0015) Heels are non tender and elevated off the bed using rolled towels as well as bone foam under operative heel  Intake/Output from previous day: 09/10 0701 - 09/11 0700 In: 3806.7 [I.V.:3056.7; IV Piggyback:500] Out: 4145 [Urine:3625; Drains:470; Blood:50] Intake/Output this shift: No intake/output data recorded.   Recent Labs  06/20/17 0636 06/21/17 0339  HGB 13.6 12.6*    Recent Labs  06/20/17 0636 06/21/17 0339  WBC  --  11.3*  RBC  --  4.07*  HCT 40.0 36.7*  PLT  --  153    Recent Labs  06/20/17 0636 06/21/17 0339  NA 141 137  K 4.0 4.0  CL  --  104  CO2  --  26  BUN  --  22*  CREATININE  --  0.87  GLUCOSE 91 108*  CALCIUM  --  8.3*   No results for input(s): LABPT, INR in the last 72 hours.  EXAM General - Patient is Alert, Appropriate and Oriented Extremity - Neurologically intact Neurovascular intact Sensation intact distally Intact pulses distally Dorsiflexion/Plantar flexion intact Compartment soft Dressing - dressing C/D/I Motor Function - intact, moving foot and toes well on exam.    Past Medical History:  Diagnosis Date  . Arthritis    a. chronic joint pain  . B12 deficiency   . Chronic systolic CHF (congestive heart failure) (HCC)    a. echo 2013: EF of 50-55%, normal right ventricular systolic pressure, normal left atrium;  b. EF 40-45%, inadequate for LV wall motion, mild MR, LA severely dilated @ 52 mm, PASP nl  . History of DVT (deep vein thrombosis) 2004   s/p bariatric surgery  . History of stress test    a. there was no ST segment deviation noted during stress,    There is a small defect of moderate severity present in the apex location, suggestive of apical ischemia, intermediate risk, calculated EF 21% but visually appears to be 35-40%  . Hypertension   . Hypothyroidism   . Neuropathy   . Obesity   . OSA (obstructive sleep apnea)    a. not compliant with CPAP  . PAF (paroxysmal atrial fibrillation) (HCC)    a. CHADSVASc at least 2 (HTN and vascular disease); b. on Eliquis     Assessment/Plan: 1 Day Post-Op Procedure(s) (LRB): COMPUTER ASSISTED TOTAL KNEE ARTHROPLASTY (Right) Active Problems:   S/P total knee arthroplasty  Estimated body mass index is 45 kg/m as calculated from the following:   Height as of this encounter:  (1.905 m).   Weight as of this encounter: 163.3 kg (360 lb). Advance diet Up with therapy D/C IV fluids Plan for discharge tomorrow Discharge home with home health  Labs: Were reviewed DVT Prophylaxis - Foot Pumps, TED hose and Pradaxa Weight-Bearing as tolerated to right leg D/C O2 and Pulse OX and try on Room Air  Labs tomorrow morning Begin working on bowel movement  Lynnda ShieldsJon R. Cchc [facility]oscopy [facility] IncWolfe PA [facility] 06/21/2017, 7:41 AM

## 2017-06-21 NOTE — Progress Notes (Signed)
POD 1. Pt. Alert and oriented. VSS. Pain controlled with PO pain meds. Bone foam in place. Foley removed. Neurochecks WDL. Towel roll under left heel. Polarcare on and running. IS at the bedside and pt. Instructed on it's use. Pt. Slept very little last night. Resting quietly at this time. Will continue to monitor.

## 2017-06-21 NOTE — Evaluation (Signed)
Occupational Therapy Evaluation Patient Details Name: ERRIK MITCHELLE MRN: [id number] DOB: 1955/10/19 Today's Date: 06/21/2017    History of Present Illness Pt underwent R TKR without reported post-op complications. Pt is POD#1 at time of OT evaluation. PMH includes OSA, HTN, CHF, a-fib, and hypothyroidism   Clinical Impression   Pt is 61 year old male s/p R TKR.  Pt was independent in all ADLs prior to surgery and is eager to return to PLOF.  Pt currently requires PRN min assist for LB dressing while in seated position due to pain and limited AROM of R knee.  Pt would benefit from instruction in dressing techniques with or without assistive devices for dressing and bathing skills. Pt would also benefit from recommendations for home modifications to increase safety in the bathroom and prevent falls. Will assess for OT [facility] needs as pt progresses in therapy but do no anticipate OT needs after hospitalization at this time.  Pt would benefit from a bariatric 3:1 to support toileting needs overnight, seated showers, and elevating the commode in the bathroom.    Follow Up Recommendations  No OT follow up    Equipment Recommendations  3 in 1 bedside commode (bariatric 3:1)    Recommendations for Other Services       Precautions / Restrictions Precautions Precautions: Knee Required Braces or Orthoses: Knee Immobilizer - Right Knee Immobilizer - Right: Discontinue once straight leg raise with < 10 degree lag Restrictions Weight Bearing Restrictions: Yes RLE Weight Bearing: Weight bearing as tolerated      Mobility Bed Mobility Overal bed mobility: Modified Independent             General bed mobility comments: HOB elevated but does not need bed rails. Good sequencing and strength noted. No external assist  Transfers Overall transfer level: Needs assistance Equipment used: Rolling walker (2 wheeled) (bari 2WW) Transfers: Sit to/from Stand Sit to Stand: Supervision          General transfer comment: no physical assist, good safety awareness    Balance Overall balance assessment: Needs assistance Sitting-balance support: No upper extremity supported Sitting balance-Leahy Scale: Good     Standing balance support: No upper extremity supported Standing balance-Leahy Scale: Fair Standing balance comment: Able to maintain balance without UE support - static                           ADL either performed or assessed with clinical judgement   ADL Overall ADL's : Needs assistance/impaired                                     Functional mobility during ADLs: Min guard;Rolling walker General ADL Comments: PRN min assist for LB ADL tasks, pt reports spouse able to provide this level of assist; educated in polar care mgt, compression stocking mgt strategies, and AE for LB ADL tasks, pt verbalized understanding.     Vision Baseline Vision/History: No visual deficits Patient Visual Report: No change from baseline       Perception     Praxis      Pertinent Vitals/Pain Pain Assessment: 0-10 Pain Score: 5  Pain Location: R knee Pain Descriptors / Indicators: Aching;Grimacing;Guarding Pain Intervention(s): Limited activity within patient's tolerance;Monitored during session;Repositioned;Premedicated before session;Ice applied     Hand Dominance Right   Extremity/Trunk Assessment Upper Extremity Assessment Upper Extremity Assessment: Overall WFL for tasks  assessed   Lower Extremity Assessment Lower Extremity Assessment: RLE deficits/detail;Defer to PT evaluation   Cervical / Trunk Assessment Cervical / Trunk Assessment: Normal   Communication Communication Communication: No difficulties   Cognition Arousal/Alertness: Awake/alert Behavior During Therapy: WFL for tasks assessed/performed Overall Cognitive Status: Within Functional Limits for tasks assessed                                     General  Comments       Exercises Other Exercises Other Exercises: pt educated in home/routine modifications to maximize safety and functional independence. Pt verbalized understanding.   Shoulder Instructions      Home Living Family/patient expects to be discharged to:: Private residence Living Arrangements: Spouse/significant other Available Help at Discharge: Family Type of Home: House Home Access: Ramped entrance     Home Layout: One level     Bathroom Shower/Tub: Chief Strategy OfficerTub/shower unit   Bathroom Toilet: Handicapped height     Home Equipment: Cane - quad;Other (comment) (bari 2WW, lift recliner)          Prior Functioning/Environment Level of Independence: Independent with assistive device(s)        Comments: Intermittent use of quad cane for full community ambulation. Independent with ADLs/IADLs. Works fulltime as a Chartered certified accountantmachinist. 6-7 falls in the last 12 months due to RLE buckling.        OT Problem List: Decreased strength;Pain;Decreased range of motion;Decreased activity tolerance;Impaired balance (sitting and/or standing)      OT Treatment/Interventions: Self-care/ADL training;Therapeutic exercise;Therapeutic activities;DME and/or AE instruction;Patient/family education    OT Goals(Current goals can be found in the care plan section) Acute Rehab OT Goals Patient Stated Goal: Return to prior function at home OT Goal Formulation: With patient Time For Goal Achievement: 07/05/17 Potential to Achieve Goals: Good ADL Goals Pt Will Perform Lower Body Dressing: with set-up;with adaptive equipment;sitting/lateral leans;sit to/from stand Pt Will Transfer to Toilet: with supervision;ambulating (comfort height toilet, bari RW for ambulation)  OT Frequency: Min 1X/week   Barriers to D/C:            Co-evaluation              AM-PAC PT "6 Clicks" Daily Activity     Outcome Measure Help from another person eating meals?: None Help from another person taking care of  personal grooming?: None Help from another person toileting, which includes using toliet, bedpan, or urinal?: A Little Help from another person bathing (including washing, rinsing, drying)?: A Little Help from another person to put on and taking off regular upper body clothing?: None Help from another person to put on and taking off regular lower body clothing?: A Little 6 Click Score: 21   End of Session Equipment Utilized During Treatment: Gait belt;Rolling walker  Activity Tolerance: Patient tolerated treatment well Patient left: in chair;with call bell/phone within reach;with chair alarm set;Other (comment) (polar care in place)  OT Visit Diagnosis: Other abnormalities of gait and mobility (R26.89);Repeated falls (R29.6);Pain Pain - Right/Left: Right Pain - part of body: Knee                Time: [id number] OT Time Calculation (min): 29 min Charges:  OT General Charges $OT Visit: 1 Visit OT Evaluation $OT Eval Low Complexity: 1 Low OT Treatments $Self Care/Home Management : 8-22 mins G-Codes: OT G-codes **NOT FOR INPATIENT CLASS** Functional Assessment Tool Used: AM-PAC 6 Clicks Daily Activity;Clinical judgement Functional Limitation:  Self care Self Care Current Status [phone]): At least 20 percent but less than 40 percent impaired, limited or restricted Self Care Goal Status ([id number]): At least 1 percent but less than 20 percent impaired, limited or restricted   Richrd Prime, MPH, MS, OTR/L ascom [phone] 06/21/17, 10:21 AM

## 2017-06-21 NOTE — Progress Notes (Signed)
Physical Therapy Treatment Patient Details Name: Johnathan [name] MRN: [id number] DOB: 26-Jan-1956 Today's Date: 06/21/2017    History of Present Illness Pt underwent R TKR without reported post-op complications. Pt is POD#1 at time of OT evaluation. PMH includes OSA, HTN, CHF, a-fib, and hypothyroidism    PT Comments    Pt agreeable to PT; reports 8/10 pain R knee; received medication during session. Pt found in chair without towel roll under distal Right lower extremity; pt educated on importance of stretching right knee into extension with education on carryover to home. Right knee extension mildly worse as compared to last reading; currently -7 degrees. Flexion is 81 degrees. Educated in open versus closed knee position and flexion stretching. Pt requires cues for STS transfer for improved use of Right lower extremity. Pt progressed ambulation distance and quality with cues/education required for sequence and step lengths. Will require continued education and stair training before discharge. Continue PT to progress strength, range, endurance, and quality of transfers/ambulation to improve functional mobility and allow for a safe transition home.    Follow Up Recommendations  Outpatient PT     Equipment Recommendations       Recommendations for Other Services       Precautions / Restrictions Precautions Precautions: Knee;Fall Precaution Booklet Issued: Yes (comment) Knee Immobilizer - Right: Discontinue once straight leg raise with < 10 degree lag (pt able to SLR 10 ) Restrictions Weight Bearing Restrictions: Yes RLE Weight Bearing: Weight bearing as tolerated    Mobility  Bed Mobility Overal bed mobility: Needs Assistance Bed Mobility: Sit to Supine       Sit to supine: Min assist   General bed mobility comments: Assist for RLE into bed  Transfers Overall transfer level: Needs assistance Equipment used: Rolling walker (2 wheeled) Transfers: Sit to/from Stand Sit to  Stand: Supervision         General transfer comment: cues for use of RLE as much as possible for strengthening/stretching benefits. Also educated to perform several QS with gentle weight shift to R prior to ambulation  Ambulation/Gait Ambulation/Gait assistance: Min guard;Supervision Ambulation Distance (Feet): 45 Feet Assistive device: Rolling walker (2 wheeled) Gait Pattern/deviations: Step-to pattern;Step-through pattern;Decreased step length - left;Decreased weight shift to right (partial step through) Gait velocity: Decreased Gait velocity interpretation: Below normal speed for age/gender General Gait Details: lowered rw to appropriate height for UE's and allow improved de weighting of RLE through UEs. Instruction for proper 3 point sequence and step lengths   Stairs            Wheelchair Mobility    Modified Rankin (Stroke Patients Only)       Balance Overall balance assessment: Needs assistance Sitting-balance support: Feet supported;No upper extremity supported Sitting balance-Leahy Scale: Good     Standing balance support: Bilateral upper extremity supported Standing balance-Leahy Scale: Fair                              Cognition Arousal/Alertness: Awake/alert Behavior During Therapy: WFL for tasks assessed/performed Overall Cognitive Status: Within Functional Limits for tasks assessed                                        Exercises Total Joint Exercises Ankle Circles/Pumps: AROM;Both;20 reps Quad Sets: Strengthening;Both;20 reps (also several in stand prior to ambulation) Knee Flexion: AROM;AAROM;Right;10 reps;Seated (3 positions  each rep with 10 sec hold each) Goniometric ROM: 7-81 (pt sitting in chair long sit without towel roll; education ) Other Exercises Other Exercises: Education on use and reasoning for towel roll in regards to R knee extension. Other Exercises: Education regarding R knee flexion/extension  stretching and carryover to home for technique, frequency, intensity duration.  Other Exercises: Education on open versus closed R knee joint for swelling management.     General Comments        Pertinent Vitals/Pain Pain Assessment: 0-10 Pain Score: 8  Pain Location: R knee Pain Descriptors / Indicators: Aching;Constant Pain Intervention(s): Monitored during session;RN gave pain meds during session    Home Living                      Prior Function            PT Goals (current goals can now be found in the care plan section) Progress towards PT goals: Progressing toward goals    Frequency    BID      PT Plan Current plan remains appropriate    Co-evaluation              AM-PAC PT "6 Clicks" Daily Activity  Outcome Measure  Difficulty turning over in bed (including adjusting bedclothes, sheets and blankets)?: A Little Difficulty moving from lying on back to sitting on the side of the bed? : A Little Difficulty sitting down on and standing up from a chair with arms (e.g., wheelchair, bedside commode, etc,.)?: A Little Help needed moving to and from a bed to chair (including a wheelchair)?: A Little Help needed walking in hospital room?: A Little Help needed climbing 3-5 steps with a railing? : A Lot 6 Click Score: 17    End of Session Equipment Utilized During Treatment: Gait belt Activity Tolerance: Patient tolerated treatment well;Patient limited by fatigue;Patient limited by pain Patient left: in chair;with call bell/phone within reach;with chair alarm set;Other (comment) (polar care in place)   PT Visit Diagnosis: Unsteadiness on feet (R26.81);Muscle weakness (generalized) (M62.81);Pain Pain - Right/Left: Right Pain - part of body: Knee     Time: 1516-1600 PT Time Calculation (min) (ACUTE ONLY): 44 min  Charges:  $Gait Training: 8-22 mins $Therapeutic Exercise: 23-37 mins                    G Codes:        Scot DockHeidi E Arlenne Kimbley,  PTA 06/21/2017, 4:15 PM

## 2017-06-21 NOTE — Discharge Instructions (Signed)

## 2017-06-22 LAB — BASIC METABOLIC PANEL
Anion gap: 8 (ref 5–15)
BUN: 19 mg/dL (ref 6–20)
CALCIUM: 8.3 mg/dL — AB (ref 8.9–10.3)
CO2: 28 mmol/L (ref 22–32)
CREATININE: 0.92 mg/dL (ref 0.61–1.24)
Chloride: 101 mmol/L (ref 101–111)
GFR calc Af Amer: >60 mL/min (ref >60)
GLUCOSE: 105 mg/dL — AB (ref 65–99)
POTASSIUM: 4.2 mmol/L (ref 3.5–5.1)
SODIUM: 137 mmol/L (ref 135–145)

## 2017-06-22 LAB — CBC
HCT: 36.2 % — ABNORMAL LOW (ref 40.0–52.0)
Hemoglobin: 12.6 g/dL — ABNORMAL LOW (ref 13.0–18.0)
MCH: 31.4 pg (ref 26.0–34.0)
MCHC: 34.6 g/dL (ref 32.0–36.0)
MCV: 90.7 fL (ref 80.0–100.0)
PLATELETS: 140 10*3/uL — AB (ref 150–440)
RBC: 4 MIL/uL — ABNORMAL LOW (ref 4.40–5.90)
RDW: 13.7 % (ref 11.5–14.5)
WBC: 9.9 10*3/uL (ref 3.8–10.6)

## 2017-06-22 MED ORDER — LACTULOSE 10 GM/15ML PO SOLN
10.0000 g | Freq: Two times a day (BID) | ORAL | Status: DC | PRN
  Administered 2017-06-22: 10 g via ORAL
  Filled 2017-06-22: qty 30

## 2017-06-22 NOTE — Progress Notes (Signed)
Physical Therapy Treatment Patient Details Name: Johnathan [name] MRN: [id number] DOB: 14-Jul-1956 Today's Date: 06/22/2017    History of Present Illness Pt underwent R TKR without reported post-op complications. Pt is POD#1 at time of OT evaluation. PMH includes OSA, HTN, CHF, a-fib, and hypothyroidism    PT Comments    Patient reports he is not up for stairs when therapist entered the room, reports his RLE is fatigued and quite painful. He tolerates manual therapy well at the end of the session and is able to modify his gait pattern with cuing for increased reliance on UEs during R sided WBing. He agrees to perform steps tomorrow as he feels he will perform better. He was able to ambulate short household distance in this session and is demonstrating improving quadricep strength.   Follow Up Recommendations  Outpatient PT     Equipment Recommendations  Rolling walker with 5" wheels    Recommendations for Other Services       Precautions / Restrictions Precautions Precautions: Knee;Fall Precaution Booklet Issued: Yes (comment) Knee Immobilizer - Right: Discontinue once straight leg raise with < 10 degree lag Restrictions Weight Bearing Restrictions: Yes RLE Weight Bearing: Weight bearing as tolerated Other Position/Activity Restrictions: Pt able to complete 10 SLR's on 9/11    Mobility  Bed Mobility               General bed mobility comments: Patient received up in chair.   Transfers Overall transfer level: Needs assistance Equipment used: Rolling walker (2 wheeled) Transfers: Sit to/from Stand Sit to Stand: Supervision         General transfer comment: Patient performs transfer slowly with use of UEs, but no loss of balance observed.   Ambulation/Gait Ambulation/Gait assistance: Supervision Ambulation Distance (Feet): 110 Feet Assistive device: Rolling walker (2 wheeled) Gait Pattern/deviations: Step-through pattern Gait velocity: Decreased Gait velocity  interpretation: <1.8 ft/sec, indicative of risk for recurrent falls General Gait Details: Patient cued to keep his elbows locked when WBing on his RLE. Slow steady advancement of his RLE with minimal knee flexion for pain control.    Stairs            Wheelchair Mobility    Modified Rankin (Stroke Patients Only)       Balance Overall balance assessment: Needs assistance Sitting-balance support: Feet supported;No upper extremity supported Sitting balance-Leahy Scale: Normal     Standing balance support: Bilateral upper extremity supported Standing balance-Leahy Scale: Fair                              Cognition Arousal/Alertness: Awake/alert Behavior During Therapy: WFL for tasks assessed/performed Overall Cognitive Status: Within Functional Limits for tasks assessed                                        Exercises  Grade 1 mobilizations provided 3 bouts x 1 minute with his knee flexed to roughly 30 degrees, patient reported mild pain relief after completion.  Long arc quads x 15 bilaterally    General Comments        Pertinent Vitals/Pain Pain Assessment: Faces Faces Pain Scale: Hurts even more Pain Location: R knee Pain Descriptors / Indicators: Aching;Constant;Jabbing Pain Intervention(s): Limited activity within patient's tolerance;Monitored during session;Ice applied;Repositioned    Home Living  Prior Function            PT Goals (current goals can now be found in the care plan section) Acute Rehab PT Goals Patient Stated Goal: Return to prior function at home PT Goal Formulation: With patient Time For Goal Achievement: 07/04/17 Potential to Achieve Goals: Good Progress towards PT goals: Progressing toward goals    Frequency    BID      PT Plan Current plan remains appropriate    Co-evaluation              AM-PAC PT "6 Clicks" Daily Activity  Outcome Measure  Difficulty  turning over in bed (including adjusting bedclothes, sheets and blankets)?: A Little Difficulty moving from lying on back to sitting on the side of the bed? : A Little Difficulty sitting down on and standing up from a chair with arms (e.g., wheelchair, bedside commode, etc,.)?: A Little Help needed moving to and from a bed to chair (including a wheelchair)?: A Little Help needed walking in hospital room?: A Little Help needed climbing 3-5 steps with a railing? : A Little 6 Click Score: 18    End of Session Equipment Utilized During Treatment: Gait belt Activity Tolerance: Patient tolerated treatment well;Patient limited by fatigue;Patient limited by pain Patient left: in chair;with call bell/phone within reach;Other (comment) (refused alarm; will call for assist) Nurse Communication: Mobility status PT Visit Diagnosis: Unsteadiness on feet (R26.81);Muscle weakness (generalized) (M62.81);Pain Pain - Right/Left: Right Pain - part of body: Knee     Time: [id number] PT Time Calculation (min) (ACUTE ONLY): 18 min  Charges:  $Gait Training: 8-22 mins                    G Codes:  Functional Assessment Tool Used: AM-PAC 6 Clicks Basic Mobility Functional Limitation: Mobility: Walking and moving around Mobility: Walking and Moving Around Current Status ([id number]([id number]): At least 40 percent but less than 60 percent impaired, limited or restricted Mobility: Walking and Moving Around Goal Status [phone]([id number]): At least 1 percent but less than 20 percent impaired, limited or restricted   Alva GarnetPatrick Naya Ilagan PT, DPT, CSCS     06/22/2017, 4:59 PM

## 2017-06-22 NOTE — Progress Notes (Signed)
Physical Therapy Treatment Patient Details Name: Johnathan [name] MRN: [id number] DOB: [birth year] Today's Date: 06/22/2017    History of Present Illness Pt underwent R TKR without reported post-op complications. Pt is POD#1 at time of OT evaluation. PMH includes OSA, HTN, CHF, a-fib, and hypothyroidism    PT Comments    Pt agreeable to PT; reports 5/10 pain at rest and 7/10 with activity. Pt received pain medication during session. Mod I for bed mobility to edge of bed. Supervision for transfers and ambulation with good use of hands and improved use of Right lower extremity. Cues for QS prior to ambulation. Improved distance for ambulation as well as quality wit continued cues for equal use of Bilateral lower extremities; heavy use of bilateral upper extremities. Range improved this session to 0-85 degrees. Continue PT to progress range, strength, endurance to allow for an optimal, safe return home. Noted that pt has ramp for entering/exiting the home.    Follow Up Recommendations  Outpatient PT     Equipment Recommendations       Recommendations for Other Services       Precautions / Restrictions Precautions Precautions: Knee;Fall Precaution Booklet Issued: Yes (comment) Knee Immobilizer - Right: Discontinue once straight leg raise with < 10 degree lag Restrictions Weight Bearing Restrictions: Yes RLE Weight Bearing: Weight bearing as tolerated    Mobility  Bed Mobility Overal bed mobility: Modified Independent Bed Mobility: Supine to Sit     Supine to sit: Modified independent (Device/Increase time)     General bed mobility comments: Mild increased time and use of rail  Transfers Overall transfer level: Needs assistance Equipment used: Rolling walker (2 wheeled) Transfers: Sit to/from Stand Sit to Stand: Supervision         General transfer comment: Good use of hands and improved use of BLEs  Ambulation/Gait Ambulation/Gait assistance: Supervision Ambulation  Distance (Feet): 110 Feet Assistive device: Rolling walker (2 wheeled) Gait Pattern/deviations: Step-through pattern (partial to equal step through with cues) Gait velocity: Decreased Gait velocity interpretation: Below normal speed for age/gender General Gait Details: improve step through with cues; inconsistent. Heavy use of BUEs    Stairs            Wheelchair Mobility    Modified Rankin (Stroke Patients Only)       Balance Overall balance assessment: Needs assistance Sitting-balance support: Feet supported;No upper extremity supported Sitting balance-Leahy Scale: Normal     Standing balance support: Bilateral upper extremity supported Standing balance-Leahy Scale: Fair                              Cognition Arousal/Alertness: Awake/alert Behavior During Therapy: WFL for tasks assessed/performed Overall Cognitive Status: Within Functional Limits for tasks assessed                                        Exercises Total Joint Exercises Quad Sets: Strengthening;Both;20 reps (also several in stand with wt shift) Knee Flexion: AROM;10 reps;Seated;Right (3 positions each rep with 10 sec hold each) Goniometric ROM: 0-85    General Comments        Pertinent Vitals/Pain Pain Assessment: 0-10 Pain Score: 7  (5 at rest) Pain Location: R knee Pain Descriptors / Indicators: Aching;Constant;Jabbing Pain Intervention(s): Monitored during session;Premedicated before session;Repositioned;Ice applied    Home Living  Prior Function            PT Goals (current goals can now be found in the care plan section) Progress towards PT goals: Progressing toward goals    Frequency    BID      PT Plan Current plan remains appropriate    Co-evaluation              AM-PAC PT "6 Clicks" Daily Activity  Outcome Measure  Difficulty turning over in bed (including adjusting bedclothes, sheets and blankets)?:  A Little Difficulty moving from lying on back to sitting on the side of the bed? : A Little Difficulty sitting down on and standing up from a chair with arms (e.g., wheelchair, bedside commode, etc,.)?: A Little Help needed moving to and from a bed to chair (including a wheelchair)?: A Little Help needed walking in hospital room?: A Little Help needed climbing 3-5 steps with a railing? : A Little 6 Click Score: 18    End of Session Equipment Utilized During Treatment: Gait belt Activity Tolerance: Patient tolerated treatment well;Patient limited by fatigue;Patient limited by pain Patient left: in chair;with call bell/phone within reach;Other (comment) (refused alarm; will call for assist)   PT Visit Diagnosis: Unsteadiness on feet (R26.81);Muscle weakness (generalized) (M62.81);Pain Pain - Right/Left: Right Pain - part of body: Knee     Time: [id number] PT Time Calculation (min) (ACUTE ONLY): 36 min  Charges:  $Gait Training: 8-22 mins $Therapeutic Exercise: 8-22 mins                    G Codes:        Johnathan [name], PTA 06/22/2017, 11:13 AM

## 2017-06-22 NOTE — Progress Notes (Signed)
Subjective: 2 Days Post-Op Procedure(s) (LRB): COMPUTER ASSISTED TOTAL KNEE ARTHROPLASTY (Right) Patient reports pain as moderate.  But does not appear to be in a lot of discomfort Patient is well, and has had no acute complaints or problems Did fair with therapy yesterday. Need to push and today's surgery that we can discharge him. Plan is to go Home after hospital stay. no nausea and no vomiting Patient denies any chest pains or shortness of breath. Objective: Vital signs in last 24 hours: Temp:  [97.8 F (36.6 C)-98.8 F (37.1 C)] 98.8 F (37.1 C) (09/11 2015) Pulse Rate:  [52-64] 64 (09/11 2015) Resp:  [17-20] 17 (09/11 2015) BP: (132-146)/(63-86) 146/86 (09/11 2015) SpO2:  [93 %-97 %] 97 % (09/11 2015) well approximated incision Heels are non tender and elevated off the bed using rolled towels Intake/Output from previous day: 09/11 0701 - 09/12 0700 In: 720 [P.O.:720] Out: 1995 [Urine:1625; Drains:370] Intake/Output this shift: Total I/O In: -  Out: 1640 [Urine:1500; Drains:140]   Recent Labs  06/20/17 0636 06/21/17 0339 06/22/17 0349  HGB 13.6 12.6* 12.6*    Recent Labs  06/21/17 0339 06/22/17 0349  WBC 11.3* 9.9  RBC 4.07* 4.00*  HCT 36.7* 36.2*  PLT 153 140*    Recent Labs  06/21/17 0339 06/22/17 0349  NA 137 137  K 4.0 4.2  CL 104 101  CO2 26 28  BUN 22* 19  CREATININE 0.87 0.92  GLUCOSE 108* 105*  CALCIUM 8.3* 8.3*   No results for input(s): LABPT, INR in the last 72 hours.  EXAM General - Patient is Alert, Appropriate and Oriented Extremity - Neurologically intact Neurovascular intact Sensation intact distally Intact pulses distally Dorsiflexion/Plantar flexion intact No cellulitis present Compartment soft Dressing - dressing C/D/I . Patient did start having some bleeding from the drain site once the drains were pulled. Motor Function - intact, moving foot and toes well on exam.    Past Medical History:  Diagnosis Date  .  Arthritis    a. chronic joint pain  . B12 deficiency   . Chronic systolic CHF (congestive heart failure) (HCC)    a. echo 2013: EF of 50-55%, normal right ventricular systolic pressure, normal left atrium; b. EF 40-45%, inadequate for LV wall motion, mild MR, LA severely dilated @ 52 mm, PASP nl  . History of DVT (deep vein thrombosis) 2004   s/p bariatric surgery  . History of stress test    a. there was no ST segment deviation noted during stress,    There is a small defect of moderate severity present in the apex location, suggestive of apical ischemia, intermediate risk, calculated EF 21% but visually appears to be 35-40%  . Hypertension   . Hypothyroidism   . Neuropathy   . Obesity   . OSA (obstructive sleep apnea)    a. not compliant with CPAP  . PAF (paroxysmal atrial fibrillation) (HCC)    a. CHADSVASc at least 2 (HTN and vascular disease); b. on Eliquis     Assessment/Plan: 2 Days Post-Op Procedure(s) (LRB): COMPUTER ASSISTED TOTAL KNEE ARTHROPLASTY (Right) Active Problems:   S/P total knee arthroplasty  Estimated body mass index is 45 kg/m as calculated from the following:   Height as of this encounter: 6\' 3"  (1.905 m).   Weight as of this encounter: 163.3 kg (360 lb). Up with therapy Discharge home with home health  Labs: Were reviewed DVT Prophylaxis - Foot Pumps, TED hose and pradaxa Weight-Bearing as tolerated to right leg  Hemovac discontinued on today's visit Patient needs a bowel movement prior to being discharged Please wash the operative leg and apply TED stockings. Bone foam to go home with patient. Please give the patient 2 extra honeycomb dressings to take home.  Lynnda Shields. [facility] PA [facility] 06/22/2017, 7:00 AM

## 2017-06-22 NOTE — Care Management Important Message (Signed)
Important Message  Patient Details  Name: Johnathan [name] MRN: [id number] Date of Birth: Oct 10, 1956   Medicare Important Message Given:  N/A - LOS <3 / Initial given by admissions    Marily MemosLisa M Yeraldin Litzenberger, RN 06/22/2017, 11:18 AM

## 2017-06-22 NOTE — Progress Notes (Signed)
OT Cancellation Note  Patient Details Name: Johnathan [name] MRN: [id number] DOB: Jan 21, 1956   Cancelled Treatment:    Reason Eval/Treat Not Completed: Patient declined, no reason specified. Upon attempt to treat, spouse in room with pt, both verbalized no additional OT needs at this time. Able to verbalize plan for return home with supportive environmental set up and plan for assist with compression stockings, polar care mgt, etc. Pt politely declines additional OT at this time. Will sign off, no additional acute OT needs at this time.   Richrd PrimeJamie Stiller, MPH, MS, OTR/L ascom [phone] 06/22/17, 3:46 PM

## 2017-06-23 NOTE — Progress Notes (Signed)
Physical Therapy Treatment Patient Details Name: Johnathan [name] MRN: [id number] DOB: 1956-09-04 Today's Date: 06/23/2017    History of Present Illness Pt underwent R TKR without reported post-op complications. Pt is POD#1 at time of OT evaluation. PMH includes OSA, HTN, CHF, a-fib, and hypothyroidism    PT Comments    Participated in exercises as described below.  Pt awaiting discharge and stair training.  Pt with independent bed mobility and sitting balance.  Stood with min guard and was able to ambulate to rehab gym and up/down 4 steps with bilateral rails and min guard +2 for safety.  He reports having a ramp at home and does not use stairs.  Reviewed ramp safety.  He continued to ambulate to desk before requesting to return to his room.  No LOB and gait generally steady.  Pt reports no further questions or concerns regarding therapy.   Follow Up Recommendations  Outpatient PT     Equipment Recommendations  Rolling walker with 5" wheels    Recommendations for Other Services       Precautions / Restrictions Precautions Precautions: Knee;Fall Required Braces or Orthoses: Knee Immobilizer - Right Knee Immobilizer - Right: Discontinue once straight leg raise with < 10 degree lag Restrictions Weight Bearing Restrictions: Yes RLE Weight Bearing: Weight bearing as tolerated Other Position/Activity Restrictions: Pt able to complete 10 SLR's on 9/11    Mobility  Bed Mobility Overal bed mobility: Modified Independent Bed Mobility: Supine to Sit     Supine to sit: Modified independent (Device/Increase time)        Transfers Overall transfer level: Needs assistance Equipment used: Rolling walker (2 wheeled) Transfers: Sit to/from Stand Sit to Stand: Supervision         General transfer comment: Patient performs transfer slowly with use of UEs, but no loss of balance observed.   Ambulation/Gait Ambulation/Gait assistance: Supervision Ambulation Distance (Feet): 120  Feet Assistive device: Rolling walker (2 wheeled) Gait Pattern/deviations: Step-through pattern Gait velocity: Decreased Gait velocity interpretation: <1.8 ft/sec, indicative of risk for recurrent falls     Stairs Stairs: Yes   Stair Management: Two rails Number of Stairs: 4 General stair comments: Pt has ramp at home, reviewed ramp safety.  Wheelchair Mobility    Modified Rankin (Stroke Patients Only)       Balance Overall balance assessment: Needs assistance Sitting-balance support: Feet supported;No upper extremity supported Sitting balance-Leahy Scale: Normal     Standing balance support: Bilateral upper extremity supported Standing balance-Leahy Scale: Fair                              Cognition Arousal/Alertness: Awake/alert Behavior During Therapy: WFL for tasks assessed/performed Overall Cognitive Status: Within Functional Limits for tasks assessed                                        Exercises Total Joint Exercises Ankle Circles/Pumps: AROM;Both;20 reps Quad Sets: Strengthening;Both;20 reps Gluteal Sets: Strengthening;Both;10 reps;Supine Towel Squeeze: Strengthening;Both;10 reps;Supine Short Arc Quad: Strengthening;Right;10 reps;Supine Heel Slides: Strengthening;Right;10 reps;Supine Hip ABduction/ADduction: Strengthening;Right;10 reps;Supine Straight Leg Raises: Strengthening;Right;10 reps;Supine Long Arc Quad: AROM;Right;Seated;10 reps Knee Flexion: AROM;10 reps;Seated;Right Goniometric ROM: 0-82    General Comments        Pertinent Vitals/Pain Pain Assessment: 0-10 Pain Score: 4  Pain Location: R knee Pain Descriptors / Indicators: Aching;Constant;Operative site guarding Pain Intervention(s): Limited  activity within patient's tolerance;Monitored during session    Home Living                      Prior Function            PT Goals (current goals can now be found in the care plan section) Progress  towards PT goals: Progressing toward goals    Frequency    BID      PT Plan Current plan remains appropriate    Co-evaluation              AM-PAC PT "6 Clicks" Daily Activity  Outcome Measure  Difficulty turning over in bed (including adjusting bedclothes, sheets and blankets)?: A Little Difficulty moving from lying on back to sitting on the side of the bed? : A Little Difficulty sitting down on and standing up from a chair with arms (e.g., wheelchair, bedside commode, etc,.)?: A Little Help needed moving to and from a bed to chair (including a wheelchair)?: A Little Help needed walking in hospital room?: A Little Help needed climbing 3-5 steps with a railing? : A Little 6 Click Score: 18    End of Session Equipment Utilized During Treatment: Gait belt Activity Tolerance: Patient tolerated treatment well;Patient limited by fatigue Patient left: in bed;with family/visitor present;with call bell/phone within reach Nurse Communication: Other (comment) Pain - Right/Left: Right Pain - part of body: Knee     Time: [id number] PT Time Calculation (min) (ACUTE ONLY): 28 min  Charges:  $Gait Training: 8-22 mins $Therapeutic Exercise: 8-22 mins                    G Codes:       Danielle DessSarah Earleen Aoun, PTA 06/23/17, 11:49 AM

## 2017-06-23 NOTE — Progress Notes (Signed)
Subjective: 3 Days Post-Op Procedure(s) (LRB): COMPUTER ASSISTED TOTAL KNEE ARTHROPLASTY (Right) Patient reports pain as moderate.   Patient is well, and has had no acute complaints or problems Continue with physical therapy. Patient will need to do steps prior to being discharged.  Plan is to go Home after hospital stay. no nausea and no vomiting Patient denies any chest pains or shortness of breath. Patient resting better. He is using bone foam.  Objective: Vital signs in last 24 hours: Temp:  [98.5 F (36.9 C)-98.8 F (37.1 C)] 98.8 F (37.1 C) (09/12 1954) Pulse Rate:  [68-76] 76 (09/12 1954) Resp:  [19] 19 (09/12 1954) BP: (118-123)/(60-70) 123/60 (09/12 1954) SpO2:  [95 %-96 %] 96 % (09/12 1954) well approximated incision Heels are non tender and elevated off the bed using rolled towels Intake/Output from previous day: 09/12 0701 - 09/13 0700 In: 600 [P.O.:600] Out: 1225 [Urine:1225] Intake/Output this shift: Total I/O In: -  Out: 1225 [Urine:1225]   Recent Labs  06/21/17 0339 06/22/17 0349  HGB 12.6* 12.6*    Recent Labs  06/21/17 0339 06/22/17 0349  WBC 11.3* 9.9  RBC 4.07* 4.00*  HCT 36.7* 36.2*  PLT 153 140*    Recent Labs  06/21/17 0339 06/22/17 0349  NA 137 137  K 4.0 4.2  CL 104 101  CO2 26 28  BUN 22* 19  CREATININE 0.87 0.92  GLUCOSE 108* 105*  CALCIUM 8.3* 8.3*   No results for input(s): LABPT, INR in the last 72 hours.  EXAM General - Patient is Alert, Appropriate and Oriented Extremity - Neurologically intact Neurovascular intact Sensation intact distally Intact pulses distally Dorsiflexion/Plantar flexion intact No cellulitis present Compartment soft Dressing - dressing C/D/I and Some dry blood around the Hemovac site  Motor Function - intact, moving foot and toes well on exam.    Past Medical History:  Diagnosis Date  . Arthritis    a. chronic joint pain  . B12 deficiency   . Chronic systolic CHF (congestive  heart failure) (HCC)    a. echo 2013: EF of 50-55%, normal right ventricular systolic pressure, normal left atrium; b. EF 40-45%, inadequate for LV wall motion, mild MR, LA severely dilated @ 52 mm, PASP nl  . History of DVT (deep vein thrombosis) 2004   s/p bariatric surgery  . History of stress test    a. there was no ST segment deviation noted during stress,    There is a small defect of moderate severity present in the apex location, suggestive of apical ischemia, intermediate risk, calculated EF 21% but visually appears to be 35-40%  . Hypertension   . Hypothyroidism   . Neuropathy   . Obesity   . OSA (obstructive sleep apnea)    a. not compliant with CPAP  . PAF (paroxysmal atrial fibrillation) (HCC)    a. CHADSVASc at least 2 (HTN and vascular disease); b. on Eliquis     Assessment/Plan: 3 Days Post-Op Procedure(s) (LRB): COMPUTER ASSISTED TOTAL KNEE ARTHROPLASTY (Right) Active Problems:   S/P total knee arthroplasty  Estimated body mass index is 45 kg/m as calculated from the following:   Height as of this encounter:  (1.905 m).   Weight as of this encounter: 163.3 kg (360 lb). Up with therapy Discharge home with home health  Labs: None today DVT Prophylaxis - Foot Pumps, TED hose and pradaxa Weight-Bearing as tolerated to right leg Please change dressing prior to patient being discharged today. Patient may be discharged once  she does steps with physical therapy Please see the patient 2 extra honeycomb dressings to take home. TED stockings bilaterally lower extremities Bone foam to go with patient  Lynnda ShieldsJon R. [facility] PA [facility] 06/23/2017, 6:57 AM

## 2017-06-23 NOTE — Progress Notes (Signed)
Pt discharged to home via wheelchair per MD order accompanied by spouse without incident. Prior to discharge all discharge teachings done both written and verbal and all questions answered. Pt discharged with prescriptions for tramadol and oxycodone. Upon discharge, pt pain controlled and no change in pt from AM assessment. Wife states that her husband's pharmacy has called with a prescription ready for Lovenox for them to pick up. Per Dr. Elenor LegatoHooten's order, proceed with Pradaxa per order and do not pick up Lovenox from pharmacy. Pt and wife verbalize understanding and agree to comply. Pt has follow up appointments with Dr. Ernest PineHooten.

## 2017-06-23 NOTE — Care Management Note (Signed)
Case Management Note  Patient Details  Name: Johnathan [name] MRN: [id number] Date of Birth: 08/01/1956  Subjective/Objective:   OP PT rescheduled for Friday, Sept 14, 8:00 am. Patient updated. Discharge paper work updated.                  Action/Plan:   Expected Discharge Date:  06/23/17               Expected Discharge Plan:  OP Rehab  In-House Referral:     Discharge planning Services  CM Consult  Post Acute Care Choice:    Choice offered to:  Patient  DME Arranged:    DME Agency:     HH Arranged:    HH Agency:     Status of Service:  Completed, signed off  If discussed at [employer] Length of Stay Meetings, dates discussed:    Additional Comments:  Marily MemosLisa M Audia Amick, RN 06/23/2017, 8:34 AM

## 2017-06-23 NOTE — Care Management (Signed)
Patient to be discharge home on pradaxa, not Lovenox. Patient has been discharged but was updated by staff on correct anticoagulant.

## 2017-06-23 NOTE — Care Management Note (Signed)
Case Management Note  Patient Details  Name: Johnathan [name] MRN: [id number] Date of Birth: Mar 27, 1956  Subjective/Objective:   Discharging today                 Action/Plan: Cost of Lovenox is $ no charge.Precription picked up yesterday by family.   Expected Discharge Date:  06/23/17               Expected Discharge Plan:  OP Rehab  In-House Referral:     Discharge planning Services  CM Consult  Post Acute Care Choice:    Choice offered to:  Patient  DME Arranged:    DME Agency:     HH Arranged:    HH Agency:     Status of Service:  Completed, signed off  If discussed at [employer] Length of Stay Meetings, dates discussed:    Additional Comments:  Marily MemosLisa M Kila Godina, RN 06/23/2017, 8:23 AM

## 2017-06-28 ENCOUNTER — Ambulatory Visit (INDEPENDENT_AMBULATORY_CARE_PROVIDER_SITE_OTHER): Payer: 59 | Admitting: Internal Medicine

## 2017-06-28 ENCOUNTER — Encounter: Payer: Self-pay | Admitting: Internal Medicine

## 2017-06-28 VITALS — BP 130/78 | HR 59 | Temp 98.1°F | Resp 17 | Ht 75.0 in | Wt 351.8 lb

## 2017-06-28 DIAGNOSIS — D62 Acute posthemorrhagic anemia: Secondary | ICD-10-CM

## 2017-06-28 DIAGNOSIS — M159 Polyosteoarthritis, unspecified: Secondary | ICD-10-CM

## 2017-06-28 MED ORDER — HYDROCODONE-ACETAMINOPHEN 10-325 MG PO TABS: 1.0000 | ORAL_TABLET | Freq: Four times a day (QID) | ORAL | 0 refills | Status: DC | PRN

## 2017-06-28 MED ORDER — OXYCODONE HCL 5 MG PO TABS: 5.0000 mg | ORAL_TABLET | ORAL | 0 refills | Status: DC | PRN

## 2017-06-28 NOTE — Progress Notes (Signed)
Subjective:  Patient ID: Johnathan [name], male    DOB: 1955/12/30  Age: 61 y.o. MRN: [id number]  CC: The primary encounter diagnosis was Acute blood loss as cause of postoperative anemia. 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  HPI Johnathan [name] presents for follow up on chronic pain.  Patient was admitted to [facility] on  Sept 10  For  elective right knee replacement and discharged on September 13 .     He rejected home PT because his past experience "was a joke"  4 years ago.  He has started outpatient PT .  Pretreating his sessions with opioids.   He has been Using oxycodone 10 mg every 4 hours post operatively, prescribed by Orthopedics  PA Van Clines.  Managing constipation with stool softeners and dulcolax. He feels he is ready to reduce his dose or return to using vicodin .   Refill history confirmed via [place] Controlled Substance databas, accessed by me today..   Outpatient Medications Prior to Visit  Medication Sig Dispense Refill  . amiodarone (PACERONE) 200 MG tablet Take 1 tablet (200 mg total) by mouth 2 (two) times daily. 180 tablet 3  . B Complex Vitamins (B COMPLEX 50 PO) Take 1 tablet by mouth daily.     Marland Kitchen buPROPion (WELLBUTRIN XL) 150 MG 24 hr tablet TAKE 1 TABLET (150 MG TOTAL) BY MOUTH DAILY. 90 tablet 1  . Cholecalciferol (VITAMIN D3) 2000 UNITS TABS Take 1 tablet by mouth daily.     . cyclobenzaprine (FLEXERIL) 10 MG tablet Take 1 tablet (10 mg total) by mouth 3 (three) times daily as needed for muscle spasms. 30 tablet 0  . dabigatran (PRADAXA) 150 MG CAPS capsule Take 1 capsule (150 mg total) by mouth 2 (two) times daily. 60 capsule 11  . docusate sodium (DULCOLAX) 100 MG capsule Take 400 mg by mouth daily.     . Ferrous Gluconate 324 (37.5 Fe) MG TABS Take 1 tablet by mouth daily.     . furosemide (LASIX) 40 MG tablet Take 1 tablet (40 mg total) by mouth 2 (two) times daily as needed.  180 tablet 3  . gabapentin (NEURONTIN) 100 MG capsule Take 1 capsule by mouth  every 6 hours 360 capsule 1  . GLUCOSAMINE HCL-MSM PO Take 1 tablet by mouth daily.     Marland Kitchen levothyroxine (SYNTHROID, LEVOTHROID) 25 MCG tablet TAKE 1 TABLET BY MOUTH  DAILY BEFORE BREAKFAST 90 tablet 1  . losartan-hydrochlorothiazide (HYZAAR) 100-25 MG tablet Take 1 tablet by mouth daily. 90 tablet 3  . metoprolol succinate (TOPROL-XL) 25 MG 24 hr tablet TAKE 1 TABLET (25 MG TOTAL) BY MOUTH DAILY. 90 tablet 1  . Multiple Vitamin (MULTIVITAMIN) tablet Take 1 tablet by mouth daily.    . nitroGLYCERIN (NITROSTAT) 0.4 MG SL tablet Place 1 tablet (0.4 mg total) under the tongue every 5 (five) minutes as needed for chest pain. 30 tablet 3  . Potassium Chloride ER 20 MEQ TBCR Take 20 mEq by mouth 2 (two) times daily as needed. 180 tablet 3  . propranolol (INDERAL) 20 MG tablet Take 20 mg by mouth 3 (three) times daily as needed. Reported on 03/22/2016    . traMADol (ULTRAM) 50 MG tablet Take 1-2 tablets (50-100 mg total) by mouth every 4 (four) hours as needed for moderate pain. 60 tablet 0  . venlafaxine XR (EFFEXOR XR) 37.5 MG 24 hr capsule Take 1  capsule (37.5 mg total) by mouth daily with breakfast. 90 capsule 1  . vitamin B-12 (CYANOCOBALAMIN) 500 MCG tablet Take 500 mcg by mouth daily.    . vitamin E 400 UNIT capsule Take 400 Units by mouth daily.    Marland Kitchen HYDROcodone-acetaminophen (NORCO) 10-325 MG tablet Take 1 tablet by mouth every 6 (six) hours as needed. 120 tablet 0  . oxyCODONE (OXY IR/ROXICODONE) 5 MG immediate release tablet Take 1-2 tablets (5-10 mg total) by mouth every 4 (four) hours as needed for severe pain or breakthrough pain. 60 tablet 0   No facility-administered medications prior to visit.     Review of Systems;  Patient denies headache, fevers, malaise, unintentional weight loss, skin rash, eye pain, sinus congestion and sinus pain, sore throat, dysphagia,  hemoptysis , cough, dyspnea, wheezing, chest  pain, palpitations, orthopnea, edema, abdominal pain, nausea, melena, diarrhea, constipation, flank pain, dysuria, hematuria, urinary  Frequency, nocturia, numbness, tingling, seizures,  Focal weakness, Loss of consciousness,  Tremor, insomnia, depression, anxiety, and suicidal ideation.      Objective:  BP 130/78 (BP Location: Right Arm, Patient Position: Sitting, Cuff Size: Large)   Pulse (!) 59   Temp 98.1 F (36.7 C) (Oral)   Resp 17   Ht  (1.905 m)   Wt (!) 351 lb 12.8 oz (159.6 kg)   SpO2 93%   BMI 43.97 kg/m   BP Readings from Last 3 Encounters:  06/28/17 130/78  06/23/17 122/70  06/14/17 (!) 156/81    Wt Readings from Last 3 Encounters:  06/28/17 (!) 351 lb 12.8 oz (159.6 kg)  06/20/17 (!) 360 lb (163.3 kg)  06/14/17 (!) 362 lb 12 oz (164.5 kg)    General appearance: alert, cooperative and appears stated age Back: symmetric, no curvature. ROM normal. No CVA tenderness. Lungs: clear to auscultation bilaterally Heart: regular rate and rhythm, S1, S2 normal, no murmur, click, rub or gallop Abdomen: soft, non-tender; bowel sounds normal; no masses,  no organomegaly Pulses: 2+ and symmetric Skin: Skin color, texture, turgor normal. No rashes or lesions Lymph nodes: Cervical, supraclavicular, and axillary nodes normal.  Lab Results  Component Value Date   HGBA1C 5.0 03/29/2016    Lab Results  Component Value Date   CREATININE 0.92 06/22/2017   CREATININE 0.87 06/21/2017   CREATININE 0.92 06/09/2017    Lab Results  Component Value Date   WBC 8.7 06/28/2017   HGB 13.0 06/28/2017   HCT 38.1 06/28/2017   PLT 261 06/28/2017   GLUCOSE 105 (H) 06/22/2017   CHOL 183 09/15/2016   TRIG 70 09/15/2016   HDL 53 09/15/2016   LDLCALC 116 (H) 09/15/2016   ALT 35 06/09/2017   AST 35 06/09/2017   NA 137 06/22/2017   K 4.2 06/22/2017   CL 101 06/22/2017   CREATININE 0.92 06/22/2017   BUN 19 06/22/2017   CO2 28 06/22/2017   TSH 3.110 09/15/2016   INR 1.21  06/09/2017   HGBA1C 5.0 03/29/2016    No results found.  Assessment & Plan:   Problem List Items Addressed This Visit    Acute blood loss as cause of postoperative anemia - Primary    hgb dropped a point postoperatively but has improved.       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  He is requesting a reduction in pain medication.  He has suspended Vicodin and is currently taking 10 mg oxycodone every 4 hours.  Will reduce dose to 5 mg every 4 hours for the next month, followed by resuming his previous regimen of vicodin 10/325 every 6 hours. .   His Refill history was confirmed via [place] Controlled Substance database by me today during his visit .  I have refilled his hydrocodone for 2 months,       Relevant Medications   oxyCODONE (OXY IR/ROXICODONE) 5 MG immediate release tablet   HYDROcodone-acetaminophen (NORCO) 10-325 MG tablet    A total of 25 minutes of face to face time was spent with patient more than half of which was spent in counselling about the above mentioned conditions  and coordination of care   I have changed Mr. Hoeppner's oxyCODONE. I am also having him maintain his Ferrous Gluconate, multivitamin, B Complex Vitamins (B COMPLEX 50 PO), vitamin B-12, Vitamin D3, GLUCOSAMINE HCL-MSM PO, vitamin E, nitroGLYCERIN, furosemide, Potassium Chloride ER, propranolol, gabapentin, dabigatran, docusate sodium, losartan-hydrochlorothiazide, venlafaxine XR, buPROPion, cyclobenzaprine, levothyroxine, amiodarone, metoprolol succinate, traMADol, and HYDROcodone-acetaminophen.  Meds ordered this encounter  Medications  . oxyCODONE (OXY IR/ROXICODONE) 5 MG immediate release tablet    Sig: Take 1 tablet (5 mg total) by mouth every 4 (four) hours as needed for severe pain or breakthrough pain. Postoperative knee pain    Dispense:  180 tablet     Refill:  0  . DISCONTD: HYDROcodone-acetaminophen (NORCO) 10-325 MG tablet    Sig: Take 1 tablet by mouth every 6 (six) hours as needed.    Dispense:  120 tablet    Refill:  0    May refill on or After July 28 2017  . HYDROcodone-acetaminophen (NORCO) 10-325 MG tablet    Sig: Take 1 tablet by mouth every 6 (six) hours as needed.    Dispense:  120 tablet    Refill:  0    May refill on or After August 28 2017    Medications Discontinued During This Encounter  Medication Reason  . oxyCODONE (OXY IR/ROXICODONE) 5 MG immediate release tablet Reorder  . HYDROcodone-acetaminophen (NORCO) 10-325 MG tablet Reorder  . HYDROcodone-acetaminophen (NORCO) 10-325 MG tablet Reorder    Follow-up: Return in about 3 months (around 09/26/2017), or by Dec 17th .   Sherlene Shams, MD

## 2017-06-28 NOTE — Patient Instructions (Addendum)
Continue oxycodone for the next month, but reduce dose to 5 mg every 6 hours  Add tylenol up to 2000 mg daily In divided doses and Ok to use tramadol up to 100 mg every 6 hours if needed  between  The oxycodone   resume vicodin in one month   RTC mid December for med refills

## 2017-06-29 LAB — CBC WITH DIFFERENTIAL/PLATELET
BASOS ABS: 0 10*3/uL (ref 0.0–0.2)
Basos: 0 %
EOS (ABSOLUTE): 0.2 10*3/uL (ref 0.0–0.4)
EOS: 2 %
HEMATOCRIT: 38.1 % (ref 37.5–51.0)
Hemoglobin: 13 g/dL (ref 13.0–17.7)
IMMATURE GRANULOCYTES: 1 %
Immature Grans (Abs): 0.1 10*3/uL (ref 0.0–0.1)
LYMPHS ABS: 2 10*3/uL (ref 0.7–3.1)
Lymphs: 23 %
MCH: 31.1 pg (ref 26.6–33.0)
MCHC: 34.1 g/dL (ref 31.5–35.7)
MCV: 91 fL (ref 79–97)
MONOS ABS: 0.9 10*3/uL (ref 0.1–0.9)
Monocytes: 11 %
NEUTROS PCT: 63 %
Neutrophils Absolute: 5.5 10*3/uL (ref 1.4–7.0)
PLATELETS: 261 10*3/uL (ref 150–379)
RBC: 4.18 x10E6/uL (ref 4.14–5.80)
RDW: 13.5 % (ref 12.3–15.4)
WBC: 8.7 10*3/uL (ref 3.4–10.8)

## 2017-06-30 DIAGNOSIS — D62 Acute posthemorrhagic anemia: Secondary | ICD-10-CM | POA: Insufficient documentation

## 2017-06-30 NOTE — Assessment & Plan Note (Signed)
hgb dropped a point postoperatively but has improved.

## 2017-06-30 NOTE — Assessment & Plan Note (Signed)
He is s/p right knee replacement one week ago.  He is requesting a reduction in pain medication.  He has suspended Vicodin and is currently taking 10 mg oxycodone every 4 hours.  Will reduce dose to 5 mg every 4 hours for the next month, followed by resuming his previous regimen of vicodin 10/325 every 6 hours. .   His Refill history was confirmed via [place] Controlled Substance database by me today during his visit .  I have refilled his hydrocodone for 2 months,

## 2017-07-01 ENCOUNTER — Encounter: Payer: Self-pay | Admitting: Internal Medicine

## 2017-07-15 NOTE — Telephone Encounter (Signed)
Mailed unread message

## 2017-08-18 ENCOUNTER — Other Ambulatory Visit: Payer: Self-pay | Admitting: Family Medicine

## 2017-08-18 NOTE — Telephone Encounter (Signed)
Refill done.  Pt needs an appt for future refills.  

## 2017-09-08 ENCOUNTER — Other Ambulatory Visit: Payer: Self-pay | Admitting: Internal Medicine

## 2017-09-08 ENCOUNTER — Other Ambulatory Visit: Payer: Self-pay | Admitting: Cardiovascular Disease

## 2017-09-08 NOTE — Telephone Encounter (Signed)
Please review for refill. Thanks!  

## 2017-09-15 ENCOUNTER — Other Ambulatory Visit: Payer: Self-pay | Admitting: Family Medicine

## 2017-09-19 ENCOUNTER — Other Ambulatory Visit: Payer: Self-pay | Admitting: Family Medicine

## 2017-09-20 NOTE — Telephone Encounter (Signed)
Refill denied.  Pt needs an appt for refills.

## 2017-09-26 ENCOUNTER — Encounter: Payer: Self-pay | Admitting: Internal Medicine

## 2017-09-26 ENCOUNTER — Ambulatory Visit (INDEPENDENT_AMBULATORY_CARE_PROVIDER_SITE_OTHER): Payer: 59 | Admitting: Internal Medicine

## 2017-09-26 DIAGNOSIS — M159 Polyosteoarthritis, unspecified: Secondary | ICD-10-CM | POA: Diagnosis not present

## 2017-09-26 DIAGNOSIS — F330 Major depressive disorder, recurrent, mild: Secondary | ICD-10-CM

## 2017-09-26 DIAGNOSIS — F329 Major depressive disorder, single episode, unspecified: Secondary | ICD-10-CM | POA: Insufficient documentation

## 2017-09-26 MED ORDER — HYDROCODONE-ACETAMINOPHEN 10-325 MG PO TABS: 1.0000 | ORAL_TABLET | Freq: Four times a day (QID) | ORAL | 0 refills | Status: DC | PRN

## 2017-09-26 MED ORDER — HYDROCODONE-ACETAMINOPHEN 10-325 MG PO TABS
1.0000 | ORAL_TABLET | Freq: Four times a day (QID) | ORAL | 0 refills | Status: DC | PRN
Start: 2017-09-26 — End: 2017-12-27

## 2017-09-26 MED ORDER — BUPROPION HCL ER (XL) 150 MG PO TB24: ORAL_TABLET | ORAL | 3 refills | Status: DC

## 2017-09-26 NOTE — Progress Notes (Signed)
Subjective:  Patient ID: Johnathan [name], male    DOB: [birth year]  Age: 61 y.o. 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  HPI Johnathan Dawesdward A Connole presents for follow up on chronic pain,  Depression  Mood worse.  Alternates between a 150 and 300 mg of  wellbutrin .  effexor 37.5 mg for neuropathy, started by Dr Katrinka BlazingSmith.  Hs last wellbutrin refill was only refilled for 30 tablets so he has been taking 1 tablet daily and has been irritable,  Depressed, and unable to handle multiple issues at once. Marland Kitchen.   Has been back to work since Thanksgiving full time  Knee still painful due to altered gait.  Knees feel wobbly.  Also having neck pain and right shoulder pain   "threw out shoulder"  The first week back.the  pain radiates down right upper arm..  Felt something "pop"  Has been icing it. Can't rase arm in abducted position   Switching to [facility]   Outpatient Medications Prior to Visit  Medication Sig Dispense Refill  . amiodarone (PACERONE) 200 MG tablet Take 1 tablet (200 mg total) by mouth 2 (two) times daily. 180 tablet 3  . B Complex Vitamins (B COMPLEX 50 PO) Take 1 tablet by mouth daily.     . Cholecalciferol (VITAMIN D3) 2000 UNITS TABS Take 1 tablet by mouth daily.     . cyclobenzaprine (FLEXERIL) 10 MG tablet Take 1 tablet (10 mg total) by mouth 3 (three) times daily as needed for muscle spasms. 30 tablet 0  . dabigatran (PRADAXA) 150 MG CAPS capsule Take 1 capsule (150 mg total) by mouth 2 (two) times daily. 60 capsule 11  . docusate sodium (DULCOLAX) 100 MG capsule Take 400 mg by mouth daily.     . Ferrous Gluconate 324 (37.5 Fe) MG TABS Take 1 tablet by mouth daily.     . furosemide (LASIX) 40 MG tablet Take 1 tablet (40 mg total) by mouth 2 (two) times daily as needed. 180 tablet 3  . gabapentin (NEURONTIN) 100 MG  capsule Take 1 capsule by mouth  every 6 hours 360 capsule 1  . GLUCOSAMINE HCL-MSM PO Take 1 tablet by mouth daily.     Marland Kitchen. levothyroxine (SYNTHROID, LEVOTHROID) 25 MCG tablet TAKE 1 TABLET BY MOUTH  DAILY BEFORE BREAKFAST 90 tablet 1  . losartan-hydrochlorothiazide (HYZAAR) 100-25 MG tablet TAKE 1 TABLET BY MOUTH DAILY. 90 tablet 3  . metoprolol succinate (TOPROL-XL) 25 MG 24 hr tablet TAKE 1 TABLET (25 MG TOTAL) BY MOUTH DAILY. 90 tablet 1  . Multiple Vitamin (MULTIVITAMIN) tablet Take 1 tablet by mouth daily.    . nitroGLYCERIN (NITROSTAT) 0.4 MG SL tablet Place 1 tablet (0.4 mg total) under the tongue every 5 (five) minutes as needed for chest pain. 30 tablet 3  . Potassium Chloride ER 20 MEQ TBCR Take 20 mEq by mouth 2 (two) times daily as needed. 180 tablet 3  . PRADAXA 150 MG CAPS capsule TAKE 1 CAPSULE BY MOUTH 2 TIMES DAILY 180 capsule 3  . propranolol (INDERAL) 20 MG tablet Take 20 mg by mouth 3 (three) times daily as needed. Reported on 03/22/2016    . venlafaxine XR (EFFEXOR XR) 37.5 MG 24 hr capsule Take 1 capsule (37.5 mg total) by mouth daily with breakfast. 90 capsule 1  . vitamin B-12 (CYANOCOBALAMIN) 500 MCG  tablet Take 500 mcg by mouth daily.    . vitamin E 400 UNIT capsule Take 400 Units by mouth daily.    Marland Kitchen. buPROPion (WELLBUTRIN XL) 150 MG 24 hr tablet Take 1 tablet (150 mg total) daily by mouth. NEEDS AN APPOINTMENT FOR FUTURE REFILLS 30 tablet 0  . HYDROcodone-acetaminophen (NORCO) 10-325 MG tablet Take 1 tablet by mouth every 6 (six) hours as needed. 120 tablet 0  . oxyCODONE (OXY IR/ROXICODONE) 5 MG immediate release tablet Take 1 tablet (5 mg total) by mouth every 4 (four) hours as needed for severe pain or breakthrough pain. Postoperative knee pain (Patient not taking: Reported on 09/26/2017) 180 tablet 0  . traMADol (ULTRAM) 50 MG tablet Take 1-2 tablets (50-100 mg total) by mouth every 4 (four) hours as needed for moderate pain. (Patient not taking: Reported on 09/26/2017)  60 tablet 0   No facility-administered medications prior to visit.     Review of Systems;  Patient denies headache, fevers, malaise, unintentional weight loss, skin rash, eye pain, sinus congestion and sinus pain, sore throat, dysphagia,  hemoptysis , cough, dyspnea, wheezing, chest pain, palpitations, orthopnea, edema, abdominal pain, nausea, melena, diarrhea, constipation, flank pain, dysuria, hematuria, urinary  Frequency, nocturia, numbness, tingling, seizures,  Focal weakness, Loss of consciousness,  Tremor, insomnia, depression, anxiety, and suicidal ideation.      Objective:  BP 130/78 (BP Location: Left Arm, Patient Position: Sitting, Cuff Size: Large)   Pulse 70   Temp 98.3 F (36.8 C) (Oral)   Resp 16   Ht 6\' 3"  (1.905 m)   Wt (!) 363 lb 3.2 oz (164.7 kg)   SpO2 96%   BMI 45.40 kg/m   BP Readings from Last 3 Encounters:  09/26/17 130/78  06/28/17 130/78  06/23/17 122/70    Wt Readings from Last 3 Encounters:  09/26/17 (!) 363 lb 3.2 oz (164.7 kg)  06/28/17 (!) 351 lb 12.8 oz (159.6 kg)  06/20/17 (!) 360 lb (163.3 kg)    General appearance: alert, cooperative and appears stated age Ears: normal TM's and external ear canals both ears Throat: lips, mucosa, and tongue normal; teeth and gums normal Neck: no adenopathy, no carotid bruit, supple, symmetrical, trachea midline and thyroid not enlarged, symmetric, no tenderness/mass/nodules Back: symmetric, no curvature. ROM normal. No CVA tenderness. Lungs: clear to auscultation bilaterally Heart: irregularly irregular rate and rhythm, S1, S2 normal, no murmur, click, rub or gallop Abdomen: soft, non-tender; bowel sounds normal; no masses,  no organomegaly Pulses: 2+ and symmetric Skin: Skin color, texture, turgor normal. No rashes or lesions Lymph nodes: Cervical, supraclavicular, and axillary nodes normal.  Lab Results  Component Value Date   HGBA1C 5.0 03/29/2016    Lab Results  Component Value Date    CREATININE 0.92 06/22/2017   CREATININE 0.87 06/21/2017   CREATININE 0.92 06/09/2017    Lab Results  Component Value Date   WBC 8.7 06/28/2017   HGB 13.0 06/28/2017   HCT 38.1 06/28/2017   PLT 261 06/28/2017   GLUCOSE 105 (H) 06/22/2017   CHOL 183 09/15/2016   TRIG 70 09/15/2016   HDL 53 09/15/2016   LDLCALC 116 (H) 09/15/2016   ALT 35 06/09/2017   AST 35 06/09/2017   NA 137 06/22/2017   K 4.2 06/22/2017   CL 101 06/22/2017   CREATININE 0.92 06/22/2017   BUN 19 06/22/2017   CO2 28 06/22/2017   TSH 3.110 09/15/2016   INR 1.21 06/09/2017   HGBA1C 5.0 03/29/2016    No  results found.  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  He is requesting a refill in his previous regimen of Vicodin    His Refill history was confirmed via [place] Controlled Substance database by me today during his visit .  I have refilled his hydrocodone for 3 months,       Relevant Medications   HYDROcodone-acetaminophen (NORCO) 10-325 MG tablet   Major depressive disorder with current active episode    Irritability,  Poor concentration and anger. resume wellbutrin 150 mg alternating with 300 mg daily ,  Refills given .  May consider increasing venlafaxine if needed       Relevant Medications   buPROPion (WELLBUTRIN XL) 150 MG 24 hr tablet      I have discontinued Esli A. Doerr's traMADol and oxyCODONE. I have also changed his buPROPion. Additionally, I am having him maintain his Ferrous Gluconate, multivitamin, B Complex Vitamins (B COMPLEX 50 PO), vitamin B-12, Vitamin D3, GLUCOSAMINE HCL-MSM PO, vitamin E, nitroGLYCERIN, furosemide, Potassium Chloride ER, propranolol, gabapentin, dabigatran, docusate sodium, venlafaxine XR, cyclobenzaprine, levothyroxine, amiodarone, metoprolol succinate, losartan-hydrochlorothiazide, PRADAXA, and  HYDROcodone-acetaminophen.  Meds ordered this encounter  Medications  . buPROPion (WELLBUTRIN XL) 150 MG 24 hr tablet    Sig: One tablet daily alternating with 2    Dispense:  135 tablet    Refill:  3  . DISCONTD: HYDROcodone-acetaminophen (NORCO) 10-325 MG tablet    Sig: Take 1 tablet by mouth every 6 (six) hours as needed.    Dispense:  124 tablet    Refill:  0    May refill on or After September 27 2017  . DISCONTD: HYDROcodone-acetaminophen (NORCO) 10-325 MG tablet    Sig: Take 1 tablet by mouth every 6 (six) hours as needed.    Dispense:  124 tablet    Refill:  0    May refill on or After October 28 2017  . HYDROcodone-acetaminophen (NORCO) 10-325 MG tablet    Sig: Take 1 tablet by mouth every 6 (six) hours as needed.    Dispense:  120 tablet    Refill:  0    May refill on or After November 28 2017    Medications Discontinued During This Encounter  Medication Reason  . oxyCODONE (OXY IR/ROXICODONE) 5 MG immediate release tablet   . traMADol (ULTRAM) 50 MG tablet   . buPROPion (WELLBUTRIN XL) 150 MG 24 hr tablet Reorder  . HYDROcodone-acetaminophen (NORCO) 10-325 MG tablet Reorder  . HYDROcodone-acetaminophen (NORCO) 10-325 MG tablet Reorder  . HYDROcodone-acetaminophen (NORCO) 10-325 MG tablet Reorder    Follow-up: No Follow-up on file.   Sherlene Shams, MD

## 2017-09-26 NOTE — Assessment & Plan Note (Signed)
He is s/p right knee replacement in September.  He is requesting a refill in his previous regimen of Vicodin    His Refill history was confirmed via  Controlled Substance database by me today during his visit .  I have refilled his hydrocodone for 3 months,

## 2017-09-26 NOTE — Assessment & Plan Note (Signed)
Irritability,  Poor concentration and anger. resume wellbutrin 150 mg alternating with 300 mg daily ,  Refills given .  May consider increasing venlafaxine if needed

## 2017-09-26 NOTE — Patient Instructions (Addendum)
Try using the "Headspace" app on your I phone to help you relax   I have sent the increased  wellbutrin quantity in a 90 day refill  [facility]    If your irritability does not improve,  We can increase the effexor to 75 mg daily (it is an antidepressant that also helps with anxiety)   See you in 3 months

## 2017-10-09 ENCOUNTER — Other Ambulatory Visit: Payer: Self-pay | Admitting: Internal Medicine

## 2017-10-17 ENCOUNTER — Telehealth: Payer: Self-pay | Admitting: Internal Medicine

## 2017-10-17 MED ORDER — BUPROPION HCL ER (XL) 150 MG PO TB24: ORAL_TABLET | ORAL | 0 refills | Status: DC

## 2017-10-17 NOTE — Telephone Encounter (Signed)
Copied from CRM [phone]#[id number]. Topic: Inquiry >> Oct 17, 2017  1:12 PM Raquel SarnaHayes, Teresa G wrote: Pt[facility]  They state he will not receive the refill until Jan. 14th.  He is asking if Dr. Darrick Huntsmanullo can give him enough to last him until he receives the original refill on Jan.  14th?  Pt hasn't taken the Rx for 2 weeks.  [facility] [address],  [place] -  [phone]

## 2017-10-17 NOTE — Telephone Encounter (Signed)
Refill given until mail order is received. Medication sent to requested pharmacy.

## 2017-11-13 ENCOUNTER — Other Ambulatory Visit: Payer: Self-pay | Admitting: Internal Medicine

## 2017-11-21 ENCOUNTER — Telehealth: Payer: Self-pay | Admitting: Internal Medicine

## 2017-11-21 MED ORDER — METOPROLOL SUCCINATE ER 25 MG PO TB24
25.0000 mg | ORAL_TABLET | Freq: Every day | ORAL | 1 refills | Status: DC
Start: 2017-11-21 — End: 2018-06-05

## 2017-11-21 NOTE — Telephone Encounter (Signed)
Copied from CRM [phone]. Topic: Quick Communication - Rx Refill/Question >> Nov 21, 2017 11:04 AM Percival SpanishKennedy, Cheryl W wrote: Medication   metoprolol succinate (TOPROL-XL) 25 MG 24 hr tablet        [facility]    Has the patient contacted their pharmacy yes    Preferred Pharmacy  [facility] [place] [facility]   Agent: Please be advised that RX refills may take up to 3 business days. We ask that you follow-up with your pharmacy.

## 2017-11-21 NOTE — Telephone Encounter (Signed)
Called [facility] [place]  Informed the pharmacist that the patient does not want to have the Metoprolol Succinate filled at their pharmacy.    Will send Rx for [facility] Succinate [facility] mg[facility] [address] in [place], per pt. request.

## 2017-12-20 ENCOUNTER — Other Ambulatory Visit: Payer: Self-pay

## 2017-12-20 ENCOUNTER — Telehealth: Payer: Self-pay | Admitting: Internal Medicine

## 2017-12-20 MED ORDER — VENLAFAXINE HCL ER 37.5 MG PO CP24: 37.5000 mg | ORAL_CAPSULE | Freq: Every day | ORAL | 0 refills | Status: DC

## 2017-12-20 NOTE — Telephone Encounter (Signed)
Copied from CRM [phone]. Topic: General - Other >> Dec 20, 2017  9:05 AM Cecelia ByarsGreen, Rakwon Letourneau L, RMA wrote: Reason for CRM: Medication refill request for  venlafaxine XR (EFFEXOR XR) 37.5 MG 24 hr capsule to be sent to [facility]

## 2017-12-23 ENCOUNTER — Other Ambulatory Visit: Payer: Self-pay | Admitting: Family Medicine

## 2017-12-27 ENCOUNTER — Ambulatory Visit (INDEPENDENT_AMBULATORY_CARE_PROVIDER_SITE_OTHER): Payer: 59 | Admitting: Internal Medicine

## 2017-12-27 ENCOUNTER — Encounter: Payer: Self-pay | Admitting: Internal Medicine

## 2017-12-27 VITALS — BP 130/72 | HR 51 | Temp 98.2°F | Resp 15 | Ht 75.0 in | Wt 356.4 lb

## 2017-12-27 DIAGNOSIS — E02 Subclinical iodine-deficiency hypothyroidism: Secondary | ICD-10-CM

## 2017-12-27 DIAGNOSIS — M159 Polyosteoarthritis, unspecified: Secondary | ICD-10-CM

## 2017-12-27 DIAGNOSIS — F330 Major depressive disorder, recurrent, mild: Secondary | ICD-10-CM

## 2017-12-27 DIAGNOSIS — D62 Acute posthemorrhagic anemia: Secondary | ICD-10-CM

## 2017-12-27 DIAGNOSIS — R350 Frequency of micturition: Secondary | ICD-10-CM | POA: Diagnosis not present

## 2017-12-27 DIAGNOSIS — I10 Essential (primary) hypertension: Secondary | ICD-10-CM

## 2017-12-27 DIAGNOSIS — M7521 Bicipital tendinitis, right shoulder: Secondary | ICD-10-CM

## 2017-12-27 DIAGNOSIS — Z79899 Other long term (current) drug therapy: Secondary | ICD-10-CM | POA: Diagnosis not present

## 2017-12-27 DIAGNOSIS — E538 Deficiency of other specified B group vitamins: Secondary | ICD-10-CM | POA: Diagnosis not present

## 2017-12-27 LAB — POCT URINALYSIS DIPSTICK
BILIRUBIN UA: NEGATIVE
GLUCOSE UA: NEGATIVE
Ketones, UA: NEGATIVE
Nitrite, UA: NEGATIVE
Protein, UA: NEGATIVE
Spec Grav, UA: 1.02 (ref 1.010–1.025)
Urobilinogen, UA: 0.2 E.U./dL
pH, UA: 5 (ref 5.0–8.0)

## 2017-12-27 MED ORDER — LOSARTAN POTASSIUM-HCTZ 100-25 MG PO TABS: 1.0000 | ORAL_TABLET | Freq: Every day | ORAL | 3 refills | Status: DC

## 2017-12-27 MED ORDER — HYDROCODONE-ACETAMINOPHEN 10-325 MG PO TABS: 1.0000 | ORAL_TABLET | Freq: Four times a day (QID) | ORAL | 0 refills | Status: DC | PRN

## 2017-12-27 MED ORDER — SULFAMETHOXAZOLE-TRIMETHOPRIM 800-160 MG PO TABS: 1.0000 | ORAL_TABLET | Freq: Two times a day (BID) | ORAL | 0 refills | Status: DC

## 2017-12-27 NOTE — Progress Notes (Signed)
Subjective:  Patient ID: Johnathan [name], male    DOB: Sep 04, 1956  Age: 62 y.o. MRN: [id number]  CC: The primary encounter diagnosis was Urinary frequency. 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  HPI Johnathan [name] presents for medication refills   Cc: new onset RIGHT SHOULDER PAIN, injury from pulling something too heavy across his upper body a week ago,  Cannot raise arm .  wa seen unofficially by Dr Tamala Julian during his wife's visit and told he had dislocated a tendon (presumedly his biceps tendon)  .  Has a 4 week follow up with Tamala Julian.   4 months out from right total knee replacement   Has been having cloudy urine,  Urinary Frequency and a foul odor for the past month .  Denies suprapubic or urethral pain currentlly but initially reported urethral pain .  Outpatient Medications Prior to Visit  Medication Sig Dispense Refill  . amiodarone (PACERONE) 200 MG tablet Take 1 tablet (200 mg total) by mouth 2 (two) times daily. 180 tablet 3  . B Complex Vitamins (B COMPLEX 50 PO) Take 1 tablet by mouth daily.     Marland Kitchen buPROPion (WELLBUTRIN XL) 150 MG 24 hr tablet One tablet daily alternating with 2 10 tablet 0  . Cholecalciferol (VITAMIN D3) 2000 UNITS TABS Take 1 tablet by mouth daily.     . cyclobenzaprine (FLEXERIL) 10 MG tablet Take 1 tablet (10 mg total) by mouth 3 (three) times daily as needed for muscle spasms. 30 tablet 0  . dabigatran (PRADAXA) 150 MG CAPS capsule Take 1 capsule (150 mg total) by mouth 2 (two) times daily. 60 capsule 11  . docusate sodium (DULCOLAX) 100 MG capsule Take 400 mg by mouth daily.     . Ferrous Gluconate 324 (37.5 Fe)  MG TABS Take 1 tablet by mouth daily.     . furosemide (LASIX) 40 MG tablet Take 1 tablet (40 mg total) by mouth 2 (two) times daily as needed. 180 tablet 3  . gabapentin (NEURONTIN) 100 MG capsule Take 1 capsule by mouth  every 6 hours 360 capsule 1  . GLUCOSAMINE HCL-MSM PO Take 1 tablet by mouth daily.     Marland Kitchen levothyroxine (SYNTHROID, LEVOTHROID) 25 MCG tablet TAKE 1 TABLET BY MOUTH  DAILY BEFORE BREAKFAST 90 tablet 1  . metoprolol succinate (TOPROL-XL) 25 MG 24 hr tablet Take 1 tablet (25 mg total) by mouth daily. 90 tablet 1  . Multiple Vitamin (MULTIVITAMIN) tablet Take 1 tablet by mouth daily.    . nitroGLYCERIN (NITROSTAT) 0.4 MG SL tablet Place 1 tablet (0.4 mg total) under the tongue every 5 (five) minutes as needed for chest pain. 30 tablet 3  . Potassium Chloride ER 20 MEQ TBCR Take 20 mEq by mouth 2 (two) times daily as needed. 180 tablet 3  . PRADAXA 150 MG CAPS capsule TAKE 1 CAPSULE BY MOUTH 2 TIMES DAILY 180 capsule 3  . propranolol (INDERAL) 20 MG tablet Take 20 mg by mouth 3 (three) times daily as needed. Reported on 03/22/2016    . venlafaxine XR (EFFEXOR XR) 37.5 MG 24 hr capsule Take 1 capsule (37.5 mg total) by mouth daily with breakfast. 90 capsule 0  . vitamin B-12 (CYANOCOBALAMIN) 500  MCG tablet Take 500 mcg by mouth daily.    . vitamin E 400 UNIT capsule Take 400 Units by mouth daily.    Marland Kitchen HYDROcodone-acetaminophen (NORCO) 10-325 MG tablet Take 1 tablet by mouth every 6 (six) hours as needed. 120 tablet 0  . losartan-hydrochlorothiazide (HYZAAR) 100-25 MG tablet TAKE 1 TABLET BY MOUTH DAILY. 90 tablet 3  . losartan-hydrochlorothiazide (HYZAAR) 50-12.5 MG tablet TAKE 1 TABLET BY MOUTH  DAILY 90 tablet 1   No facility-administered medications prior to visit.     Review of Systems;  Patient denies headache, fevers, malaise, unintentional weight loss, skin rash, eye pain, sinus congestion and sinus pain, sore throat, dysphagia,  hemoptysis , cough, dyspnea, wheezing, chest  pain, palpitations, orthopnea, edema, abdominal pain, nausea, melena, diarrhea, constipation, flank pain, dysuria, hematuria, , numbness, tingling, seizures,  Focal weakness, Loss of consciousness,  Tremor, insomnia, depression, anxiety, and suicidal ideation.      Objective:  BP 130/72 (BP Location: Left Arm, Patient Position: Sitting, Cuff Size: Normal)   Pulse (!) 51   Temp 98.2 F (36.8 C) (Oral)   Resp 15   Ht _0  (1.905 m)   Wt (!) 356 lb 6.4 oz (161.7 kg)   SpO2 91%   BMI 44.55 kg/m   BP Readings from Last 3 Encounters:  12/27/17 130/72  09/26/17 130/78  06/28/17 130/78    Wt Readings from Last 3 Encounters:  12/27/17 (!) 356 lb 6.4 oz (161.7 kg)  09/26/17 (!) 363 lb 3.2 oz (164.7 kg)  06/28/17 (!) 351 lb 12.8 oz (159.6 kg)    General appearance: alert, cooperative and appears stated age Ears: normal TM's and external ear canals both ears Throat: lips, mucosa, and tongue normal; teeth and gums normal Neck: no adenopathy, no carotid bruit, supple, symmetrical, trachea midline and thyroid not enlarged, symmetric, no tenderness/mass/nodules Back: symmetric, no curvature. ROM normal. No CVA tenderness. Lungs: clear to auscultation bilaterally Heart: regular rate and rhythm, S1, S2 normal, no murmur, click, rub or gallop Abdomen: soft, non-tender; bowel sounds normal; no masses,  no organomegaly Pulses: 2+ and symmetric Skin: Skin color, texture, turgor normal. No rashes or lesions Lymph nodes: Cervical, supraclavicular, and axillary nodes normal. MSK:  Right shoulder:  pain to palpation in the biceps groove , ROM limited to 45 degrees  Lab Results  Component Value Date   HGBA1C 5.0 03/29/2016    Lab Results  Component Value Date   CREATININE 1.19 12/27/2017   CREATININE 0.92 06/22/2017   CREATININE 0.87 06/21/2017    Lab Results  Component Value Date   WBC 8.7 12/27/2017   HGB 14.6 12/27/2017   HCT 42.6 12/27/2017   PLT 193 12/27/2017   GLUCOSE 83  12/27/2017   CHOL 183 09/15/2016   TRIG 70 09/15/2016   HDL 53 09/15/2016   LDLCALC 116 (H) 09/15/2016   ALT 20 12/27/2017   AST 22 12/27/2017   NA 146 (H) 12/27/2017   K 4.8 12/27/2017   CL 107 (H) 12/27/2017   CREATININE 1.19 12/27/2017   BUN 21 12/27/2017   CO2 23 12/27/2017   TSH 3.110 09/15/2016   INR 1.21 06/09/2017   HGBA1C 5.0 03/29/2016    No results found.  Assessment & Plan:   Problem List Items Addressed This Visit    Long-term use of high-risk medication   Essential hypertension   Relevant Medications   losartan-hydrochlorothiazide (HYZAAR) 100-25 MG tablet   Other Relevant Orders   Comp Met (CMET) (Completed)   Urinary  frequency - Primary    prostatitis suspected,  Empiric septra DS pending urine culture       Relevant Medications   sulfamethoxazole-trimethoprim (BACTRIM DS,SEPTRA DS) 800-160 MG tablet   Other Relevant Orders   POCT urinalysis dipstick (Completed)   CBC w/Diff (Completed)   PSA (Completed)   Urine Culture   Urine Microscopic Only (Completed)   Major depressive disorder with current active episode    Irritability,  Poor concentration and anger improved with resuming of  wellbutrin 150 mg alternating with 300 mg daily ,  . 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  He is requesting a reduction in pain medication.   His Refill history was confirmed via [place] Controlled Substance database by me today.   I have refilled his hydrocodone for 3 months,       Relevant Medications   HYDROcodone-acetaminophen (NORCO) 10-325 MG tablet   Biceps tendonitis on right    Ice and rest.  Sports medicine follow up arranged.       B12 deficiency    Will need level  repeated.     Lab Results  Component Value Date   VITAMINB12 780 09/15/2016         Acute blood loss as cause of postoperative anemia    Resolved by yesterday's cbc  Lab Results  Component Value Date   WBC 8.7 12/27/2017   HGB 14.6 12/27/2017   HCT 42.6 12/27/2017   MCV 88 12/27/2017   PLT 193 12/27/2017            I am having Johnathan [name] start on sulfamethoxazole-trimethoprim. I am also having him maintain his Ferrous Gluconate, multivitamin, B Complex Vitamins (B COMPLEX 50 PO), vitamin B-12, Vitamin D3, GLUCOSAMINE HCL-MSM PO, vitamin E, nitroGLYCERIN, furosemide, Potassium Chloride ER, propranolol, gabapentin, dabigatran, docusate sodium, cyclobenzaprine, levothyroxine, amiodarone, PRADAXA, buPROPion, metoprolol succinate, venlafaxine XR, losartan-hydrochlorothiazide, and HYDROcodone-acetaminophen.  Meds ordered this encounter  Medications  . losartan-hydrochlorothiazide (HYZAAR) 100-25 MG tablet    Sig: Take 1 tablet by mouth daily.    Dispense:  90 tablet    Refill:  3    NOTE DOSE INCREASE  . sulfamethoxazole-trimethoprim (BACTRIM DS,SEPTRA DS) 800-160 MG tablet    Sig: Take 1 tablet by mouth 2 (two) times daily.    Dispense:  28 tablet    Refill:  0  . DISCONTD: HYDROcodone-acetaminophen (NORCO) 10-325 MG tablet    Sig: Take 1 tablet by mouth every 6 (six) hours as needed.    Dispense:  120 tablet    Refill:  0    May refill on or After December 28 2017  . DISCONTD: HYDROcodone-acetaminophen (NORCO) 10-325 MG tablet    Sig: Take 1 tablet by mouth every 6 (six) hours as needed.    Dispense:  120 tablet    Refill:  0    May refill on or After January 28 2018  . HYDROcodone-acetaminophen (NORCO) 10-325 MG tablet    Sig: Take 1 tablet by mouth every 6 (six) hours as needed.    Dispense:  120 tablet    Refill:  0    May  refill on or After Feb 27 2018    Medications Discontinued During This Encounter  Medication Reason  . losartan-hydrochlorothiazide (HYZAAR)  50-12.5 MG tablet   . losartan-hydrochlorothiazide (HYZAAR) 100-25 MG tablet Reorder  . HYDROcodone-acetaminophen (NORCO) 10-325 MG tablet Reorder  . HYDROcodone-acetaminophen (NORCO) 10-325 MG tablet Reorder  . HYDROcodone-acetaminophen (NORCO) 10-325 MG tablet Reorder    Follow-up: Return in about 3 months (around 03/29/2018) for medicaiton refill .   Crecencio Mc, MD

## 2017-12-27 NOTE — Patient Instructions (Addendum)
I am treating your for a UTI with 2 weeks of Septra  Please take it twice daily for 2 weeks  Please take a probiotic ( Align, Floraque or Culturelle) of the generic version of one of these  For a minimum of 3 weeks to prevent a serious antibiotic associated diarrhea  Called clostridium dificile colitis    Your losartan has been refilled at the higher dose (100/25)   I have refilled your hydrocodone   for 3 months (March, April May)  See you in June

## 2017-12-28 ENCOUNTER — Telehealth: Payer: Self-pay | Admitting: Internal Medicine

## 2017-12-28 DIAGNOSIS — R350 Frequency of micturition: Secondary | ICD-10-CM | POA: Insufficient documentation

## 2017-12-28 DIAGNOSIS — M7521 Bicipital tendinitis, right shoulder: Secondary | ICD-10-CM | POA: Insufficient documentation

## 2017-12-28 LAB — URINALYSIS, MICROSCOPIC ONLY: CASTS: NONE SEEN /LPF

## 2017-12-28 NOTE — Assessment & Plan Note (Signed)
He has not had thyrid level checked in over a year  Lab Results  Component Value Date   TSH 3.110 09/15/2016

## 2017-12-28 NOTE — Assessment & Plan Note (Signed)
Resolved by yesterday's cbc  Lab Results  Component Value Date   WBC 8.7 12/27/2017   HGB 14.6 12/27/2017   HCT 42.6 12/27/2017   MCV 88 12/27/2017   PLT 193 12/27/2017

## 2017-12-28 NOTE — Telephone Encounter (Signed)
[facility] has been informed that patient is still taking both medications.

## 2017-12-28 NOTE — Assessment & Plan Note (Signed)
Ice and rest.  Sports medicine follow up arranged.

## 2017-12-28 NOTE — Telephone Encounter (Signed)
Copied from CRM [phone]. Topic: Quick Communication - See Telephone Encounter >> Dec 28, 2017 11:51 AM Windy KalataMichael, [name], NT wrote: CRM for notification. See Telephone encounter for:  12/28/17.  Brett CanalesSteve is calling from [facility] and states that patient has a rx for buPROPion (WELLBUTRIN XL) 150 MG 24 hr tablet and venlafaxine XR (EFFEXOR XR) 37.5 MG 24 hr capsule and needs to know if the patient is taking both of these. Please contact.  CB# [phone] Reference # [id number]  [facility] [place] - [id number]  [address] [place] [id number]  Phone: [phone] Fax: [phone]

## 2017-12-28 NOTE — Assessment & Plan Note (Signed)
prostatitis suspected,  Empiric septra DS pending urine culture

## 2017-12-28 NOTE — Assessment & Plan Note (Signed)
Irritability,  Poor concentration and anger improved with resuming of  wellbutrin 150 mg alternating with 300 mg daily ,  . Continue low dose venlafaxine for anxiety

## 2017-12-28 NOTE — Assessment & Plan Note (Signed)
Will need level  repeated.    Lab Results  Component Value Date   VITAMINB12 780 09/15/2016

## 2017-12-28 NOTE — Assessment & Plan Note (Signed)
He is s/p right knee replacement 4 months ago.  He is requesting a reduction in pain medication.   His Refill history was confirmed via [place] Controlled Substance database by me today.   I have refilled his hydrocodone for 3 months,

## 2017-12-29 LAB — PSA: PROSTATE SPECIFIC AG, SERUM: 2 ng/mL (ref 0.0–4.0)

## 2017-12-29 LAB — CBC WITH DIFFERENTIAL/PLATELET
BASOS ABS: 0 10*3/uL (ref 0.0–0.2)
Basos: 0 %
EOS (ABSOLUTE): 0.1 10*3/uL (ref 0.0–0.4)
EOS: 2 %
Hematocrit: 42.6 % (ref 37.5–51.0)
Hemoglobin: 14.6 g/dL (ref 13.0–17.7)
IMMATURE GRANS (ABS): 0 10*3/uL (ref 0.0–0.1)
IMMATURE GRANULOCYTES: 0 %
LYMPHS ABS: 2.5 10*3/uL (ref 0.7–3.1)
LYMPHS: 29 %
MCH: 30 pg (ref 26.6–33.0)
MCHC: 34.3 g/dL (ref 31.5–35.7)
MCV: 88 fL (ref 79–97)
MONOCYTES: 10 %
Monocytes Absolute: 0.8 10*3/uL (ref 0.1–0.9)
NEUTROS PCT: 59 %
Neutrophils Absolute: 5.2 10*3/uL (ref 1.4–7.0)
Platelets: 193 10*3/uL (ref 150–379)
RBC: 4.86 x10E6/uL (ref 4.14–5.80)
RDW: 15.6 % — ABNORMAL HIGH (ref 12.3–15.4)
WBC: 8.7 10*3/uL (ref 3.4–10.8)

## 2017-12-29 LAB — COMPREHENSIVE METABOLIC PANEL
ALT: 20 IU/L (ref 0–44)
AST: 22 IU/L (ref 0–40)
Albumin/Globulin Ratio: 1.5 (ref 1.2–2.2)
Albumin: 4.7 g/dL (ref 3.6–4.8)
Alkaline Phosphatase: 121 IU/L — ABNORMAL HIGH (ref 39–117)
BILIRUBIN TOTAL: 0.4 mg/dL (ref 0.0–1.2)
BUN/Creatinine Ratio: 18 (ref 10–24)
BUN: 21 mg/dL (ref 8–27)
CHLORIDE: 107 mmol/L — AB (ref 96–106)
CO2: 23 mmol/L (ref 20–29)
CREATININE: 1.19 mg/dL (ref 0.76–1.27)
Calcium: 9 mg/dL (ref 8.6–10.2)
GFR calc non Af Amer: 65 mL/min/{1.73_m2} (ref >59)
GFR, EST AFRICAN AMERICAN: 75 mL/min/{1.73_m2} (ref >59)
GLUCOSE: 83 mg/dL (ref 65–99)
Globulin, Total: 3.1 g/dL (ref 1.5–4.5)
Potassium: 4.8 mmol/L (ref 3.5–5.2)
Sodium: 146 mmol/L — ABNORMAL HIGH (ref 134–144)
TOTAL PROTEIN: 7.8 g/dL (ref 6.0–8.5)

## 2017-12-31 LAB — URINE CULTURE

## 2018-01-05 ENCOUNTER — Other Ambulatory Visit: Payer: Self-pay | Admitting: Internal Medicine

## 2018-01-24 ENCOUNTER — Ambulatory Visit: Payer: 59 | Admitting: Family Medicine

## 2018-03-03 ENCOUNTER — Other Ambulatory Visit: Payer: Self-pay | Admitting: Internal Medicine

## 2018-03-29 ENCOUNTER — Ambulatory Visit (INDEPENDENT_AMBULATORY_CARE_PROVIDER_SITE_OTHER): Payer: 59 | Admitting: Internal Medicine

## 2018-03-29 ENCOUNTER — Encounter: Payer: Self-pay | Admitting: Internal Medicine

## 2018-03-29 VITALS — BP 130/82 | HR 71 | Temp 98.6°F | Resp 17 | Ht 75.0 in | Wt 368.8 lb

## 2018-03-29 DIAGNOSIS — R5383 Other fatigue: Secondary | ICD-10-CM | POA: Diagnosis not present

## 2018-03-29 DIAGNOSIS — M159 Polyosteoarthritis, unspecified: Secondary | ICD-10-CM

## 2018-03-29 MED ORDER — HYDROCODONE-ACETAMINOPHEN 10-325 MG PO TABS: 1.0000 | ORAL_TABLET | Freq: Four times a day (QID) | ORAL | 0 refills | Status: DC | PRN

## 2018-03-29 MED ORDER — BUPROPION HCL ER (XL) 300 MG PO TB24: 300.0000 mg | ORAL_TABLET | Freq: Every day | ORAL | 0 refills | Status: DC

## 2018-03-29 NOTE — Progress Notes (Signed)
Subjective:  Patient ID: Johnathan [name], male    DOB: 10/01/1956  Age: 62 y.o. MRN: [id number]  CC: The primary encounter diagnosis was Fatigue, unspecified type. 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  HPI Johnathan [name] presents for medication refill  .  Has not felt well for several weeks   Feels exhausted and weak all the time.  deniese chest pain, shortness of breath,palpitations  Not using CPAP since 2016  Had an episode of RUQ , radiated to right chest  Lasted 10-15 min ,  Resolved with rest and deep breathing.  occurred while walking into work,  Has not happened since.   HR has been controlled on amiodarone   Right knee replacement sept 2018.  Knee ROM is full but feels tight  just to kneel on them. .  Feels he lost muscle mass. Still doing the home exercises.  Arthritis pain affecting the hands,  Bad for 2 hours in the morning.    Planning to retire in August  Outpatient Medications Prior to Visit  Medication Sig Dispense Refill  . amiodarone (PACERONE) 200 MG tablet Take 1 tablet (200 mg total) by mouth 2 (two) times daily. 180 tablet 3  . B Complex Vitamins (B COMPLEX 50 PO) Take 1 tablet by mouth daily.     . Cholecalciferol (VITAMIN D3) 2000 UNITS TABS Take 1 tablet by mouth daily.     . cyclobenzaprine (FLEXERIL) 10 MG tablet Take 1 tablet (10 mg total) by mouth 3 (three) times daily as needed for muscle spasms. 30 tablet 0  . dabigatran (PRADAXA) 150 MG CAPS capsule Take 1 capsule (150 mg total) by mouth 2 (two) times daily. 60 capsule 11  . docusate sodium (DULCOLAX) 100 MG capsule Take 400 mg by mouth daily.     . Ferrous Gluconate 324 (37.5 Fe) MG TABS Take 1 tablet by mouth daily.     . furosemide (LASIX) 40 MG tablet Take 1 tablet (40 mg total) by mouth 2 (two) times daily as needed. 180 tablet 3  . gabapentin (NEURONTIN) 100 MG capsule  Take 1 capsule by mouth  every 6 hours 360 capsule 1  . GLUCOSAMINE HCL-MSM PO Take 1 tablet by mouth daily.     Marland Kitchen. levothyroxine (SYNTHROID, LEVOTHROID) 25 MCG tablet TAKE 1 TABLET BY MOUTH  DAILY BEFORE BREAKFAST 90 tablet 1  . losartan-hydrochlorothiazide (HYZAAR) 100-25 MG tablet Take 1 tablet by mouth daily. 90 tablet 3  . metoprolol succinate (TOPROL-XL) 25 MG 24 hr tablet Take 1 tablet (25 mg total) by mouth daily. 90 tablet 1  . Multiple Vitamin (MULTIVITAMIN) tablet Take 1 tablet by mouth daily.    . nitroGLYCERIN (NITROSTAT) 0.4 MG SL tablet Place 1 tablet (0.4 mg total) under the tongue every 5 (five) minutes as needed for chest pain. 30 tablet 3  . Potassium Chloride ER 20 MEQ TBCR Take 20 mEq by mouth 2 (two) times daily as needed. 180 tablet 3  . PRADAXA 150 MG CAPS capsule TAKE 1 CAPSULE BY MOUTH 2 TIMES DAILY 180 capsule 3  . propranolol (INDERAL) 20 MG tablet Take 20 mg by mouth 3 (three) times daily as needed. Reported on 03/22/2016    . venlafaxine XR (EFFEXOR-XR) 37.5 MG 24 hr capsule TAKE 1 CAPSULE BY MOUTH  DAILY WITH BREAKFAST 90 capsule 1  . vitamin B-12 (CYANOCOBALAMIN) 500 MCG tablet Take  500 mcg by mouth daily.    . vitamin E 400 UNIT capsule Take 400 Units by mouth daily.    Marland Kitchen buPROPion (WELLBUTRIN XL) 150 MG 24 hr tablet One tablet daily alternating with 2 10 tablet 0  . HYDROcodone-acetaminophen (NORCO) 10-325 MG tablet Take 1 tablet by mouth every 6 (six) hours as needed. 120 tablet 0  . sulfamethoxazole-trimethoprim (BACTRIM DS,SEPTRA DS) 800-160 MG tablet Take 1 tablet by mouth 2 (two) times daily. (Patient not taking: Reported on 03/29/2018) 28 tablet 0   No facility-administered medications prior to visit.     Review of Systems;  Patient denies headache, fevers, malaise, unintentional weight loss, skin rash, eye pain, sinus congestion and sinus pain, sore throat, dysphagia,  hemoptysis , cough, dyspnea, wheezing, chest pain, palpitations, orthopnea, edema,  abdominal pain, nausea, melena, diarrhea, constipation, flank pain, dysuria, hematuria, urinary  Frequency, nocturia, numbness, tingling, seizures,  Focal weakness, Loss of consciousness,  Tremor, insomnia, depression, anxiety, and suicidal ideation.      Objective:  BP 130/82 (BP Location: Left Arm, Patient Position: Sitting, Cuff Size: Large)   Pulse 71   Temp 98.6 F (37 C) (Oral)   Resp 17   Ht 6\' 3"  (1.905 m)   Wt (!) 368 lb 12.8 oz (167.3 kg)   SpO2 92%   BMI 46.10 kg/m   BP Readings from Last 3 Encounters:  03/29/18 130/82  12/27/17 130/72  09/26/17 130/78    Wt Readings from Last 3 Encounters:  03/29/18 (!) 368 lb 12.8 oz (167.3 kg)  12/27/17 (!) 356 lb 6.4 oz (161.7 kg)  09/26/17 (!) 363 lb 3.2 oz (164.7 kg)    General appearance: alert, cooperative and appears stated age Ears: normal TM's and external ear canals both ears Throat: lips, mucosa, and tongue normal; teeth and gums normal Neck: no adenopathy, no carotid bruit, supple, symmetrical, trachea midline and thyroid not enlarged, symmetric, no tenderness/mass/nodules Back: symmetric, no curvature. ROM normal. No CVA tenderness. Lungs: clear to auscultation bilaterally Heart: regular rate and rhythm, S1, S2 normal, no murmur, click, rub or gallop Abdomen: soft, non-tender; bowel sounds normal; no masses,  no organomegaly Pulses: 2+ and symmetric Skin: Skin color, texture, turgor normal. No rashes or lesions Lymph nodes: Cervical, supraclavicular, and axillary nodes normal.  Lab Results  Component Value Date   HGBA1C 5.6 03/29/2018   HGBA1C 5.0 03/29/2016    Lab Results  Component Value Date   CREATININE 1.17 03/29/2018   CREATININE 1.19 12/27/2017   CREATININE 0.92 06/22/2017    Lab Results  Component Value Date   WBC 8.7 12/27/2017   HGB 14.6 12/27/2017   HCT 42.6 12/27/2017   PLT 193 12/27/2017   GLUCOSE 90 03/29/2018   CHOL 183 09/15/2016   TRIG 70 09/15/2016   HDL 53 09/15/2016   LDLCALC  116 (H) 09/15/2016   ALT 23 03/29/2018   AST 29 03/29/2018   NA 146 (H) 03/29/2018   K 5.0 03/29/2018   CL 106 03/29/2018   CREATININE 1.17 03/29/2018   BUN 20 03/29/2018   CO2 22 03/29/2018   TSH 4.360 03/29/2018   INR 1.21 06/09/2017   HGBA1C 5.6 03/29/2018    No results found.  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  He continues to require chronic use of opioids to control his pain.  His Refill history was confirmed via [place] Controlled Substance database by me today.   I have refilled his hydrocodone for 3 months,       Relevant Medications   HYDROcodone-acetaminophen (NORCO) 10-325 MG tablet   HYDROcodone-acetaminophen (NORCO) 10-325 MG tablet   HYDROcodone-acetaminophen (NORCO) 10-325 MG tablet    A total of 25 minutes of face to face time was spent with patient more than half of which was spent in counselling about the above mentioned conditions  and coordination of care   I have discontinued Ever A. Villalona's sulfamethoxazole-trimethoprim. I have also changed his buPROPion. Additionally, I am having him maintain his Ferrous Gluconate, multivitamin, B Complex Vitamins (B COMPLEX 50 PO), vitamin B-12, Vitamin D3, GLUCOSAMINE HCL-MSM PO, vitamin E, nitroGLYCERIN, furosemide, Potassium Chloride ER, propranolol, gabapentin, dabigatran, docusate sodium, cyclobenzaprine, amiodarone, PRADAXA, metoprolol succinate, losartan-hydrochlorothiazide, levothyroxine, venlafaxine XR, HYDROcodone-acetaminophen, HYDROcodone-acetaminophen, and HYDROcodone-acetaminophen.  Meds ordered this encounter  Medications  . buPROPion (WELLBUTRIN  XL) 300 MG 24 hr tablet    Sig: Take 1 tablet (300 mg total) by mouth daily. One tablet daily alternating with 2    Dispense:  90 tablet    Refill:  0  . DISCONTD: HYDROcodone-acetaminophen (NORCO) 10-325 MG tablet    Sig: Take 1 tablet by mouth every 6 (six) hours as needed.    Dispense:  120 tablet    Refill:  0    May refill on or After March 30 2018  . HYDROcodone-acetaminophen (NORCO) 10-325 MG tablet    Sig: Take 1 tablet by mouth every 6 (six) hours as needed.    Dispense:  120 tablet    Refill:  0    May refill on or After March 29 2018  . HYDROcodone-acetaminophen (NORCO) 10-325 MG tablet    Sig: Take 1 tablet by mouth every 6 (six) hours as needed.    Dispense:  120 tablet    Refill:  0    May refill on or After April 28 2018  . HYDROcodone-acetaminophen (NORCO) 10-325 MG tablet    Sig: Take 1 tablet by mouth every 6 (six) hours as needed.    Dispense:  120 tablet    Refill:  0    May refill on or After May 28 2018    Medications Discontinued During This Encounter  Medication Reason  . sulfamethoxazole-trimethoprim (BACTRIM DS,SEPTRA DS) 800-160 MG tablet Completed Course  . buPROPion (WELLBUTRIN XL) 150 MG 24 hr tablet   . HYDROcodone-acetaminophen (NORCO) 10-325 MG tablet Reorder  . HYDROcodone-acetaminophen (NORCO) 10-325 MG tablet Reorder    Follow-up: Return in about 3 months (around 06/29/2018).   Sherlene Shams, MD

## 2018-03-29 NOTE — Patient Instructions (Addendum)
I have increased your wellbutrin to 300 mg daily   Your fatigue may be due to your untreated sleep apnea.  Please consider a repeat trial   I am checking thyroid function and screening for diabetes

## 2018-04-01 ENCOUNTER — Other Ambulatory Visit: Payer: Self-pay | Admitting: Cardiovascular Disease

## 2018-04-01 LAB — COMPREHENSIVE METABOLIC PANEL
ALBUMIN: 4.7 g/dL (ref 3.6–4.8)
ALT: 23 IU/L (ref 0–44)
AST: 29 IU/L (ref 0–40)
Albumin/Globulin Ratio: 1.7 (ref 1.2–2.2)
Alkaline Phosphatase: 114 IU/L (ref 39–117)
BUN / CREAT RATIO: 17 (ref 10–24)
BUN: 20 mg/dL (ref 8–27)
Bilirubin Total: 0.3 mg/dL (ref 0.0–1.2)
CALCIUM: 9.4 mg/dL (ref 8.6–10.2)
CO2: 22 mmol/L (ref 20–29)
CREATININE: 1.17 mg/dL (ref 0.76–1.27)
Chloride: 106 mmol/L (ref 96–106)
GFR calc Af Amer: 77 mL/min/{1.73_m2} (ref >59)
GFR calc non Af Amer: 66 mL/min/{1.73_m2} (ref >59)
GLUCOSE: 90 mg/dL (ref 65–99)
Globulin, Total: 2.8 g/dL (ref 1.5–4.5)
Potassium: 5 mmol/L (ref 3.5–5.2)
Sodium: 146 mmol/L — ABNORMAL HIGH (ref 134–144)
Total Protein: 7.5 g/dL (ref 6.0–8.5)

## 2018-04-01 LAB — TSH: TSH: 4.36 u[IU]/mL (ref 0.450–4.500)

## 2018-04-01 LAB — HEMOGLOBIN A1C
Est. average glucose Bld gHb Est-mCnc: 114 mg/dL
Hgb A1c MFr Bld: 5.6 % (ref 4.8–5.6)

## 2018-04-01 LAB — MAGNESIUM: Magnesium: 2.2 mg/dL (ref 1.6–2.3)

## 2018-04-01 NOTE — Assessment & Plan Note (Addendum)
Has had recurrent symptoms , likely related to fatigue from untreated hypoxia . Screening for thyroid, diabetes and anemia.   . Lab Results  Component Value Date   TSH 4.360 03/29/2018   Lab Results  Component Value Date   WBC 8.7 12/27/2017   HGB 14.6 12/27/2017   HCT 42.6 12/27/2017   MCV 88 12/27/2017   PLT 193 12/27/2017   Lab Results  Component Value Date   HGBA1C 5.6 03/29/2018

## 2018-04-01 NOTE — Assessment & Plan Note (Signed)
He is s/p right knee replacement 4 months ago.  He continues to require chronic use of opioids to control his pain.  His Refill history was confirmed via [place] Controlled Substance database by me today.   I have refilled his hydrocodone for 3 months,

## 2018-05-30 ENCOUNTER — Ambulatory Visit: Payer: 59 | Admitting: Family Medicine

## 2018-06-05 ENCOUNTER — Other Ambulatory Visit: Payer: Self-pay | Admitting: Internal Medicine

## 2018-06-12 ENCOUNTER — Other Ambulatory Visit: Payer: Self-pay | Admitting: Internal Medicine

## 2018-06-29 ENCOUNTER — Ambulatory Visit: Payer: BLUE CROSS/BLUE SHIELD | Admitting: Internal Medicine

## 2018-06-29 ENCOUNTER — Encounter: Payer: Self-pay | Admitting: Internal Medicine

## 2018-06-29 DIAGNOSIS — G4733 Obstructive sleep apnea (adult) (pediatric): Secondary | ICD-10-CM

## 2018-06-29 DIAGNOSIS — E02 Subclinical iodine-deficiency hypothyroidism: Secondary | ICD-10-CM

## 2018-06-29 DIAGNOSIS — M159 Polyosteoarthritis, unspecified: Secondary | ICD-10-CM | POA: Diagnosis not present

## 2018-06-29 DIAGNOSIS — I4819 Other persistent atrial fibrillation: Secondary | ICD-10-CM

## 2018-06-29 DIAGNOSIS — I481 Persistent atrial fibrillation: Secondary | ICD-10-CM

## 2018-06-29 MED ORDER — HYDROCODONE-ACETAMINOPHEN 10-325 MG PO TABS: 1.0000 | ORAL_TABLET | Freq: Three times a day (TID) | ORAL | 0 refills | Status: DC | PRN

## 2018-06-29 MED ORDER — GABAPENTIN 300 MG PO CAPS: 300.0000 mg | ORAL_CAPSULE | Freq: Two times a day (BID) | ORAL | 2 refills | Status: DC

## 2018-06-29 NOTE — Patient Instructions (Addendum)
For the next month reduce Vicodin to 3 times daily and use 500 mg acetominophen (tylenol ) in between for a total of  2 doses daily )    If your pain is not controlled with this regimen,  Let me know .  Gabapentin 300 mg bid for neuropathy, sent to mail order  Let Dr Mariah MillingGollan know that your  pulse has been in the 30's at times

## 2018-06-29 NOTE — Progress Notes (Signed)
Subjective:  Patient ID: Johnathan [name], male    DOB: [birth year]  Age: 62 y.o. 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  HPI Johnathan [name] presents for medication refill and pain managemt  Refill history confirmed via [place] Controlled Substance database, accessed by me today..last refill ojn Vicodin was August 23 for #120 .  he has not had any ER visits  And has not requested any early refills.  there have been no prescriptions of controlled substances filled from any providers other than me. .   Declines influenza vaccin  Atrial fib,  On amiodarone 200 mg bid  And pradaxa .  Pulse  dropped to 30's in office,  asymptomatic, returned to 52 at rest. has not seen his cardiologist Mariah Milling([place])  since Sept 2018   Retired last month.  Feels that he has not adjusting to all the free time and not using his time wisely.  Denies depression,  But not motivated.  Pain finally starting to improve .  Has decided to start going to the gym with  His wife but hasn't started yet   l ast sleep  study 6  yrs ago.  CPAP Not used due to  recurrent sinus infections.    Sciatica neuropathy getting worse  Out of gabapentin  Using 300 mg bid  Refill needed .  Last psa normal march 2019   No results found for: PSA    Outpatient Medications Prior to Visit  Medication Sig Dispense Refill  . amiodarone (PACERONE) 200 MG tablet TAKE 1 TABLET BY MOUTH TWO  TIMES DAILY 180 tablet 1  . B Complex Vitamins (B COMPLEX 50 PO) Take 1 tablet by mouth daily.     Marland Kitchen. buPROPion (WELLBUTRIN XL) 300 MG 24 hr tablet Take 1 tablet (300 mg total) by mouth daily. One tablet daily alternating with 2 90 tablet 0  . Cholecalciferol (VITAMIN D3) 2000 UNITS TABS Take 1 tablet by mouth daily.      . dabigatran (PRADAXA) 150 MG CAPS capsule Take 1 capsule (150 mg total) by mouth 2 (two) times daily. 60 capsule 11  . docusate sodium (DULCOLAX) 100 MG capsule Take 400 mg by mouth daily.     . Ferrous Gluconate 324 (37.5 Fe) MG TABS Take 1 tablet by mouth daily.     Marland Kitchen. GLUCOSAMINE HCL-MSM PO Take 1 tablet by mouth daily.     Marland Kitchen. HYDROcodone-acetaminophen (NORCO) 10-325 MG tablet Take 1 tablet by mouth every 6 (six) hours as needed. 120 tablet 0  . HYDROcodone-acetaminophen (NORCO) 10-325 MG tablet Take 1 tablet by mouth every 6 (six) hours as needed. 120 tablet 0  . levothyroxine (SYNTHROID, LEVOTHROID) 25 MCG tablet TAKE 1 TABLET BY MOUTH  DAILY BEFORE BREAKFAST 90 tablet 1  . losartan-hydrochlorothiazide (HYZAAR) 100-25 MG tablet Take 1 tablet by mouth daily. 90 tablet 3  . Multiple Vitamin (MULTIVITAMIN) tablet Take 1 tablet by mouth daily.    . nitroGLYCERIN (NITROSTAT) 0.4 MG SL tablet Place 1 tablet (0.4 mg total) under the tongue every 5 (five) minutes as needed for chest pain. 30 tablet 3  . PRADAXA 150 MG CAPS capsule TAKE 1 CAPSULE BY MOUTH 2 TIMES DAILY 180 capsule 3  . propranolol (INDERAL) 20 MG tablet Take 20 mg by mouth 3 (three) times daily as  needed. Reported on 03/22/2016    . venlafaxine XR (EFFEXOR-XR) 37.5 MG 24 hr capsule TAKE 1 CAPSULE BY MOUTH  DAILY WITH BREAKFAST 90 capsule 1  . vitamin B-12 (CYANOCOBALAMIN) 500 MCG tablet Take 500 mcg by mouth daily.    . vitamin E 400 UNIT capsule Take 400 Units by mouth daily.    Marland Kitchen gabapentin (NEURONTIN) 100 MG capsule Take 1 capsule by mouth  every 6 hours 360 capsule 1  . HYDROcodone-acetaminophen (NORCO) 10-325 MG tablet Take 1 tablet by mouth every 6 (six) hours as needed. 120 tablet 0  . metoprolol succinate (TOPROL-XL) 25 MG 24 hr tablet TAKE 1 TABLET(25 MG) BY MOUTH DAILY 90 tablet 1  . cyclobenzaprine (FLEXERIL) 10 MG tablet Take 1 tablet (10 mg total) by mouth 3 (three) times daily as needed for muscle spasms. (Patient not  taking: Reported on 06/29/2018) 30 tablet 0  . furosemide (LASIX) 40 MG tablet Take 1 tablet (40 mg total) by mouth 2 (two) times daily as needed. (Patient not taking: Reported on 06/29/2018) 180 tablet 3  . Potassium Chloride ER 20 MEQ TBCR Take 20 mEq by mouth 2 (two) times daily as needed. (Patient not taking: Reported on 06/29/2018) 180 tablet 3   No facility-administered medications prior to visit.     Review of Systems;  Patient denies headache, fevers, malaise, unintentional weight loss, skin rash, eye pain, sinus congestion and sinus pain, sore throat, dysphagia,  hemoptysis , cough, dyspnea, wheezing, chest pain, palpitations, orthopnea, edema, abdominal pain, nausea, melena, diarrhea, constipation, flank pain, dysuria, hematuria, urinary  Frequency, nocturia, numbness, tingling, seizures,  Focal weakness, Loss of consciousness,  Tremor, insomnia, depression, anxiety, and suicidal ideation.      Objective:  BP (!) 142/90 (BP Location: Right Arm, Patient Position: Sitting, Cuff Size: Large)   Pulse (!) 52   Temp 98.2 F (36.8 C) (Oral)   Resp 16   Ht 6\' 3"  (1.905 m)   Wt (!) 381 lb 6.4 oz (173 kg)   SpO2 (!) 52%   BMI 47.67 kg/m   BP Readings from Last 3 Encounters:  06/29/18 (!) 142/90  03/29/18 130/82  12/27/17 130/72    Wt Readings from Last 3 Encounters:  06/29/18 (!) 381 lb 6.4 oz (173 kg)  03/29/18 (!) 368 lb 12.8 oz (167.3 kg)  12/27/17 (!) 356 lb 6.4 oz (161.7 kg)    General appearance: alert, cooperative and appears stated age Ears: normal TM's and external ear canals both ears Throat: lips, mucosa, and tongue normal; teeth and gums normal Neck: no adenopathy, no carotid bruit, supple, symmetrical, trachea midline and thyroid not enlarged, symmetric, no tenderness/mass/nodules Back: symmetric, no curvature. ROM normal. No CVA tenderness. Lungs: clear to auscultation bilaterally Heart: regular rate and rhythm, S1, S2 normal, no murmur, click, rub or  gallop Abdomen: soft, non-tender; bowel sounds normal; no masses,  no organomegaly Pulses: 2+ and symmetric Skin: Skin color, texture, turgor normal. No rashes or lesions Lymph nodes: Cervical, supraclavicular, and axillary nodes normal.  Lab Results  Component Value Date   HGBA1C 5.6 03/29/2018   HGBA1C 5.0 03/29/2016    Lab Results  Component Value Date   CREATININE 1.17 03/29/2018   CREATININE 1.19 12/27/2017   CREATININE 0.92 06/22/2017    Lab Results  Component Value Date   WBC 8.7 12/27/2017   HGB 14.6 12/27/2017   HCT 42.6 12/27/2017   PLT 193 12/27/2017   GLUCOSE 90 03/29/2018   CHOL 183 09/15/2016   TRIG 70  09/15/2016   HDL 53 09/15/2016   LDLCALC 116 (H) 09/15/2016   ALT 23 03/29/2018   AST 29 03/29/2018   NA 146 (H) 03/29/2018   K 5.0 03/29/2018   CL 106 03/29/2018   CREATININE 1.17 03/29/2018   BUN 20 03/29/2018   CO2 22 03/29/2018   TSH 4.360 03/29/2018   INR 1.21 06/09/2017   HGBA1C 5.6 03/29/2018    No results found.  Assessment & Plan:   Problem List Items Addressed This Visit    A-fib [facility])    Patient had an episode os severe bradycardia,  Asymptomatic , in the office, rate in the 30's . Resting rate 50's  Will suspend metroprolol.   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 Marland Kitchen  His Refill history was confirmed via [place] Controlled Substance database by me today.   I have refilled his hydrocodone for 3 months,  But I have reduced his dose to 3 daily and advised hiim to increase tylenol use.  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  No current changes needed.   Lab Results  Component Value Date   TSH 4.360 03/29/2018         OSA (obstructive sleep apnea)    Strongly recommended repeat sleep study and use of CPAP .  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 Encouraged to begin exercising now that he has retired and focus on losing weight.        A total of 25 minutes of face to face time was spent with patient more than half of which was spent in counselling about the above mentioned conditions  and coordination of care   I have discontinued Duriel A. Sanfilippo's metoprolol succinate. I have also changed his gabapentin and HYDROcodone-acetaminophen. Additionally, I am having him maintain his Ferrous Gluconate, multivitamin, B Complex Vitamins (B COMPLEX 50 PO), vitamin B-12, Vitamin D3, GLUCOSAMINE HCL-MSM PO, vitamin E, nitroGLYCERIN, furosemide, Potassium Chloride ER, propranolol, dabigatran, docusate sodium, cyclobenzaprine, PRADAXA, losartan-hydrochlorothiazide, venlafaxine XR, buPROPion, HYDROcodone-acetaminophen, HYDROcodone-acetaminophen, amiodarone, and levothyroxine.  Meds ordered this encounter  Medications  . gabapentin (NEURONTIN) 300 MG capsule    Sig: Take 1 capsule (300 mg total) by mouth 2 (two) times daily.    Dispense:  180 capsule    Refill:  2  . HYDROcodone-acetaminophen (NORCO) 10-325 MG tablet    Sig: Take 1 tablet by mouth every 8 (eight) hours as needed for severe pain.    Dispense:  90 tablet    Refill:  0    May refill on or After Sept 23 2019    Medications Discontinued During This Encounter  Medication Reason  . gabapentin (NEURONTIN) 100 MG capsule Reorder  . HYDROcodone-acetaminophen (NORCO) 10-325 MG tablet   . metoprolol succinate (  TOPROL-XL) 25 MG 24 hr tablet      Follow-up: Return in about 3 months (around 09/28/2018).   Sherlene Shams, MD

## 2018-07-01 ENCOUNTER — Telehealth: Payer: Self-pay | Admitting: Internal Medicine

## 2018-07-01 ENCOUNTER — Encounter: Payer: Self-pay | Admitting: Internal Medicine

## 2018-07-01 NOTE — Assessment & Plan Note (Signed)
Strongly recommended repeat sleep study and use of CPAP .  He has deferred until jan 2020

## 2018-07-01 NOTE — Assessment & Plan Note (Addendum)
Patient had an episode os severe bradycardia,  Asymptomatic , in the office, rate in the 30's . Resting rate 50's  Will suspend metroprolol.   Advised to discuss with [facility] , may need a Holter monitor

## 2018-07-01 NOTE — Assessment & Plan Note (Addendum)
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 Encouraged to begin exercising now that he has retired and focus on losing weight.  

## 2018-07-01 NOTE — Assessment & Plan Note (Signed)
Thyroid function is WNL on current dose.  No current changes needed.   Lab Results  Component Value Date   TSH 4.360 03/29/2018

## 2018-07-01 NOTE — Assessment & Plan Note (Signed)
He is s/p right knee replacement 7 months ago and he retired one month ago.  Marland Kitchen.  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 Marland Kitchen.  His Refill history was confirmed via [place] Controlled Substance database by me today.   I have refilled his hydrocodone for 3 months,  But I have reduced his dose to 3 daily and advised hiim to increase tylenol use.  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

## 2018-07-11 ENCOUNTER — Telehealth: Payer: Self-pay | Admitting: Internal Medicine

## 2018-07-11 NOTE — Telephone Encounter (Signed)
Prior authorization

## 2018-07-11 NOTE — Telephone Encounter (Signed)
Wrong office - Sending to the right one.

## 2018-07-11 NOTE — Telephone Encounter (Unsigned)
Copied from CRM [id number]. Topic: Quick Communication - See Telephone Encounter >> Jul 11, 2018  1:47 PM Waymon Amato wrote: Pt is calling to say they [facility] is needing a prior authorization for his hydrocodone -then once they receive it they will supply a 30 day suppy-[facility] supply and once [facility] givens  [facility] the aurhorization the will dispense the remainder of the rx but it needs a new rx sent from the office   [facility]  Best number [phone]

## 2018-07-13 ENCOUNTER — Other Ambulatory Visit: Payer: Self-pay

## 2018-07-13 NOTE — Telephone Encounter (Signed)
PA for Hydrocodone has been submitted on covermymeds.  

## 2018-07-13 NOTE — Telephone Encounter (Signed)
PA has been submitted.

## 2018-07-18 ENCOUNTER — Telehealth: Payer: Self-pay

## 2018-07-18 MED ORDER — HYDROCODONE-ACETAMINOPHEN 10-325 MG PO TABS: 1.0000 | ORAL_TABLET | Freq: Three times a day (TID) | ORAL | 0 refills | Status: DC | PRN

## 2018-07-18 NOTE — Addendum Note (Signed)
Addended by: Sherlene Shams on: 07/18/2018 12:14 PM   Modules accepted: Orders

## 2018-07-18 NOTE — Telephone Encounter (Signed)
PA was submitted and not needed due to Hydrocodone being on the formulary. Spoke with the pharmacist this morning and she stated that they filled the 7 day supply which voided the 30 day script. So she stated that in order to fill the 30 day supply they will need a new script for the 30 day supply.

## 2018-07-18 NOTE — Telephone Encounter (Signed)
Pt wife states she is returning call. They were informed by insurance that PA was not required for Hydrocodone and that the Pharmacy did explain a new RX was needed. Please send to:    [id number], [place] - [address] AT [facility] OF [phone] (Phone) [phone] (Fax)

## 2018-07-18 NOTE — Telephone Encounter (Signed)
[facility] in [place].

## 2018-07-18 NOTE — Telephone Encounter (Signed)
Which pharmacy?

## 2018-07-18 NOTE — Telephone Encounter (Signed)
Copied from CRM [id number]. Topic: Quick Communication - See Telephone Encounter >> Jul 11, 2018  1:47 PM Waymon Amato wrote: Pt is calling to say they [facility] is needing a prior authorization for his hydrocodone -then once they receive it they will supply a 30 day suppy-[facility] supply and once [facility] givens  [facility] the aurhorization the will dispense the remainder of the rx but it needs a new rx sent from the office   [facility]  Best number [phone] >> Jul 18, 2018  1:36 PM Waymon Amato wrote: Pt wife is calling to give the office a number that we can call to give authorization about pt hydrocodone rx   [phone] tell the insurance company that it is an urgent request and that should help to get it authorized today

## 2018-07-18 NOTE — Addendum Note (Signed)
Addended by: Sherlene Shams on: 07/18/2018 09:37 AM   Modules accepted: Orders

## 2018-07-18 NOTE — Addendum Note (Signed)
Addended by: Maia Petties on: 07/18/2018 09:54 AM   Modules accepted: Orders

## 2018-07-24 NOTE — Telephone Encounter (Signed)
PA has been submitted and approved as of last Thursday. Pt is aware that it has been approved and may pick up his rx.

## 2018-08-08 ENCOUNTER — Other Ambulatory Visit: Payer: Self-pay | Admitting: Internal Medicine

## 2018-08-15 ENCOUNTER — Other Ambulatory Visit: Payer: Self-pay

## 2018-08-16 MED ORDER — HYDROCODONE-ACETAMINOPHEN 10-325 MG PO TABS: 1.0000 | ORAL_TABLET | Freq: Three times a day (TID) | ORAL | 0 refills | Status: DC | PRN

## 2018-08-16 NOTE — Telephone Encounter (Signed)
Patient last seen on 9/19 & last filled on 10/08.

## 2018-08-20 NOTE — Progress Notes (Signed)
Cardiology Office Note  Date:  08/22/2018   ID:  Johnathan [name], DOB 12-15-1955, MRN [id number]  PCP:  Johnathan Shams, MD   Chief Complaint  Patient presents with  . other    6 month f/u discuss losartan options on recall. Meds reviewed verbally with pt.    HPI:  Johnathan [name] is a pleasant 62 year old gentleman with  Morbid obesity chronic joint pain of the shoulders and knees on long-term low-dose narcotics,  hypertension,   bariatric surgery,  atrial fibrillation in August 2013,   normal ejection fraction at that time  sleep apnea, currently not on his CPAP who presents for follow-up of his atrial fibrillation.   In follow-up today reports his weight has trended up 40 pounds since he has retired Eating more, no activity Will sit around the house most of the day Unable to move much secondary to chronic bilateral knee pain Had total knee replacement last year  Denies any symptoms concerning for atrial fibrillation No tachycardia, near syncope, shortness of breath, chest pain Does not want to make any medication changes today, concerned he needs all of his medications given his weight gain  Difficulty getting losartan HCTZ through [facility] Has not been checking blood pressure at home, mildly elevated on today's visit  Tolerating Pradaxa previously had trouble on Xarelto and Eliquis.   EKG personally reviewed by myself on todays visit Shows sinus bradycardia rate 57 bpm PVCs, no significant ST or T wave changes noted   Other past medical history  previous cardioversion,successful in restoring normal sinus rhythm   tick bite in June 2016, he developed atrial fibrillation initially with RVR. Started on digoxin, continued on metoprolol and diltiazem with improved heart rate. Echocardiogram and stress test at that time Cardiac exam shows slightly depressed ejection fraction, dilated left atrium, mild LVH. Ejection fraction estimated at 40-45%. Stress test with small  region of apical perfusion defect, depressed ejection fraction.  Previously reported having tachycardia when he feels hot, improved when he cools down Previously had significant shoulder pain Prior clinic visit with shortness of breath that improved with Lasix  In the hospital 05/31/2012 with left-sided chest pain and palpitations. He was given adenosine in the emergency room that showed atrial fibrillation. He received Cardizem and started on diltiazem infusion and converted to normal sinus rhythm overnight. He was started on anticoagulation, continued on long-acting Cardizem and was discharged after echocardiogram showed normal LV function.  Echocardiogram showed normal LV function with ejection fraction 50-55%, normal right ventricular systolic pressure, normal left atrium TSH 1.7  Event monitor was performed September 16 2 07/25/2012 that showed normal sinus rhythm with PVCs, no significant atrial fibrillation Blood pressure at home has been labile, typically in the 130-140 systolic range.    PMH:   has a past medical history of Arthritis, B12 deficiency, Chronic systolic CHF (congestive heart failure) (HCC), History of DVT (deep vein thrombosis) (2004), History of stress test, Hypertension, Hypothyroidism, Neuropathy, Obesity, OSA (obstructive sleep apnea), and PAF (paroxysmal atrial fibrillation) (HCC).  PSH:    Past Surgical History:  Procedure Laterality Date  . ARTHROSCOPIC REPAIR ACL Bilateral 1993   bilateral knee x2 on left and x2 on right  . BARIATRIC SURGERY  2004   Rou en Y   . BARIATRIC SURGERY    . CHOLECYSTECTOMY  1982  . ELECTROPHYSIOLOGIC STUDY N/A 06/20/2015   Procedure: CARDIOVERSION;  Surgeon: Johnathan Iba, MD;  Location: [facility] ORS;  Service: Cardiovascular;  Laterality: N/A;  . KNEE  ARTHROPLASTY Right 06/20/2017   Procedure: COMPUTER ASSISTED TOTAL KNEE ARTHROPLASTY;  Surgeon: Johnathan Heinz, MD;  Location: [facility] ORS;  Service: Orthopedics;  Laterality:  Right;  . MENISCUS DEBRIDEMENT Left 2011   Left knee  . REPLACEMENT TOTAL KNEE Left 07/2013  . TONSILLECTOMY     Current Outpatient Medications on File Prior to Visit  Medication Sig Dispense Refill  . amiodarone (PACERONE) 200 MG tablet TAKE 1 TABLET BY MOUTH TWO  TIMES DAILY 180 tablet 1  . B Complex Vitamins (B COMPLEX 50 PO) Take 1 tablet by mouth daily.     . Bisacodyl (DULCOLAX PO) Take by mouth daily.    Marland Kitchen buPROPion (WELLBUTRIN XL) 150 MG 24 hr tablet TAKE 1 TABLET BY MOUTH  DAILY ALTERNATING WITH 2  TABLETS BY MOUTH DAILY 135 tablet 3  . buPROPion (WELLBUTRIN XL) 300 MG 24 hr tablet Take 1 tablet (300 mg total) by mouth daily. One tablet daily alternating with 2 90 tablet 0  . Cholecalciferol (VITAMIN D3) 2000 UNITS TABS Take 1 tablet by mouth daily.     . cyclobenzaprine (FLEXERIL) 10 MG tablet Take 1 tablet (10 mg total) by mouth 3 (three) times daily as needed for muscle spasms. 30 tablet 0  . dabigatran (PRADAXA) 150 MG CAPS capsule Take 1 capsule (150 mg total) by mouth 2 (two) times daily. 60 capsule 11  . docusate sodium (DULCOLAX) 100 MG capsule Take 400 mg by mouth daily.     . Ferrous Gluconate 324 (37.5 Fe) MG TABS Take 1 tablet by mouth daily.     . furosemide (LASIX) 40 MG tablet Take 1 tablet (40 mg total) by mouth 2 (two) times daily as needed. 180 tablet 3  . gabapentin (NEURONTIN) 300 MG capsule Take 1 capsule (300 mg total) by mouth 2 (two) times daily. 180 capsule 2  . GLUCOSAMINE HCL-MSM PO Take 1 tablet by mouth daily.     Marland Kitchen HYDROcodone-acetaminophen (NORCO) 10-325 MG tablet Take 1 tablet by mouth every 6 (six) hours as needed. 120 tablet 0  . HYDROcodone-acetaminophen (NORCO) 10-325 MG tablet Take 1 tablet by mouth every 6 (six) hours as needed. 120 tablet 0  . HYDROcodone-acetaminophen (NORCO) 10-325 MG tablet Take 1 tablet by mouth every 8 (eight) hours as needed for severe pain. 90 tablet 0  . levothyroxine (SYNTHROID, LEVOTHROID) 25 MCG tablet TAKE 1 TABLET  BY MOUTH  DAILY BEFORE BREAKFAST 90 tablet 1  . losartan-hydrochlorothiazide (HYZAAR) 100-25 MG tablet Take 1 tablet by mouth daily. 90 tablet 3  . metoprolol succinate (TOPROL-XL) 25 MG 24 hr tablet Take 25 mg by mouth daily.    . Multiple Vitamin (MULTIVITAMIN) tablet Take 1 tablet by mouth daily.    . nitroGLYCERIN (NITROSTAT) 0.4 MG SL tablet Place 1 tablet (0.4 mg total) under the tongue every 5 (five) minutes as needed for chest pain. 30 tablet 3  . Potassium Chloride ER 20 MEQ TBCR Take 20 mEq by mouth 2 (two) times daily as needed. 180 tablet 3  . PRADAXA 150 MG CAPS capsule TAKE 1 CAPSULE BY MOUTH 2 TIMES DAILY 180 capsule 3  . propranolol (INDERAL) 20 MG tablet Take 20 mg by mouth 3 (three) times daily as needed. Reported on 03/22/2016    . venlafaxine XR (EFFEXOR-XR) 37.5 MG 24 hr capsule TAKE 1 CAPSULE BY MOUTH  DAILY WITH BREAKFAST 90 capsule 1  . vitamin B-12 (CYANOCOBALAMIN) 500 MCG tablet Take 500 mcg by mouth daily.    . vitamin E  400 UNIT capsule Take 400 Units by mouth daily.     No current facility-administered medications on file prior to visit.      Allergies:   Patient has no known allergies.   Social History:  The patient  reports that he has never smoked. He has never used smokeless tobacco. He reports that he drinks about 2.0 standard drinks of alcohol per week. He reports that he does not use drugs.   Family History:   family history includes Alzheimer's disease in his father; Dementia in his mother; Diabetes in his father.    Review of Systems: Review of Systems  Constitutional: Negative.        Weight gain  Respiratory: Negative.   Cardiovascular: Negative.   Gastrointestinal: Negative.   Musculoskeletal: Positive for joint pain.  Neurological: Negative.   Psychiatric/Behavioral: Negative.   All other systems reviewed and are negative.    PHYSICAL EXAM: VS:  BP (!) 155/93 (BP Location: Left Arm, Patient Position: Sitting, Cuff Size: Large)   Pulse  (!) 57   Ht 6\' 3"  (1.905 m)   Wt (!) 391 lb 4 oz (177.5 kg)   BMI 48.90 kg/m  , BMI Body mass index is 48.9 kg/m. Constitutional:  oriented to person, place, and time. No distress.  Morbidly obese Difficulty maneuvering secondary to chronic knee pain, walks with a cane HENT:  Head: Grossly normal Eyes:  no discharge. No scleral icterus.  Neck: No JVD, no carotid bruits  Cardiovascular: Regular rate and rhythm, no murmurs appreciated Pulmonary/Chest: Clear to auscultation bilaterally, no wheezes or rails Abdominal: Soft.  no distension.  no tenderness.  Musculoskeletal: Normal range of motion Neurological:  normal muscle tone. Coordination normal. No atrophy Skin: Skin warm and dry Psychiatric: normal affect, pleasant  Recent Labs: 12/27/2017: Hemoglobin 14.6; Platelets 193 03/29/2018: ALT 23; BUN 20; Creatinine, Ser 1.17; Magnesium 2.2; Potassium 5.0; Sodium 146; TSH 4.360    Lipid Panel Lab Results  Component Value Date   CHOL 183 09/15/2016   HDL 53 09/15/2016   LDLCALC 116 (H) 09/15/2016   TRIG 70 09/15/2016      Wt Readings from Last 3 Encounters:  08/22/18 (!) 391 lb 4 oz (177.5 kg)  06/29/18 (!) 381 lb 6.4 oz (173 kg)  03/29/18 (!) 368 lb 12.8 oz (167.3 kg)     ASSESSMENT AND PLAN:  Atrial fibrillation, unspecified type (HCC) - Plan: EKG 12-Lead Maintaining normal sinus rhythm On amiodarone. pradaxa on thyroid supplement medication Recent amiodarone surveillance labs normal, he does not want to decrease the dose of the amiodarone down to 200 daily.  Would prefer to take this twice daily, concerned about recent weight gain and risk of further atrial fibrillation  Essential hypertension - Plan: EKG 12-Lead Blood pressure running high, we have refilled his losartan HCT Recommended he monitor blood pressure at home and call our office if this continues to run high 40 pound weight gain past year  Acute on chronic diastolic CHF (congestive heart failure)  (HCC) Appears euvolemic No changes to medications Stressed importance of weight loss  Severe obesity (BMI >= 40) (HCC) Unable to exercise secondary to chronic knee pain  He is eating more, exercising less since he has retired up 40 pounds He is very frustrated   Total encounter time more than 25 minutes  Greater than 50% was spent in counseling and coordination of care with the patient  Disposition:   F/U  12 months   Orders Placed This Encounter  Procedures  .  EKG 12-Lead     Signed, Dossie Arbour, M.D., Ph.D. 08/22/2018  [facility] [place], [place] [phone]

## 2018-08-21 ENCOUNTER — Other Ambulatory Visit: Payer: Self-pay | Admitting: Internal Medicine

## 2018-08-22 ENCOUNTER — Ambulatory Visit: Payer: BLUE CROSS/BLUE SHIELD | Admitting: Cardiovascular Disease

## 2018-08-22 ENCOUNTER — Encounter: Payer: Self-pay | Admitting: Cardiovascular Disease

## 2018-08-22 VITALS — BP 155/93 | HR 57 | Ht 75.0 in | Wt 391.2 lb

## 2018-08-22 DIAGNOSIS — I4819 Other persistent atrial fibrillation: Secondary | ICD-10-CM

## 2018-08-22 DIAGNOSIS — I872 Venous insufficiency (chronic) (peripheral): Secondary | ICD-10-CM

## 2018-08-22 DIAGNOSIS — Z86718 Personal history of other venous thrombosis and embolism: Secondary | ICD-10-CM

## 2018-08-22 DIAGNOSIS — R0602 Shortness of breath: Secondary | ICD-10-CM

## 2018-08-22 DIAGNOSIS — E7849 Other hyperlipidemia: Secondary | ICD-10-CM

## 2018-08-22 DIAGNOSIS — I5043 Acute on chronic combined systolic (congestive) and diastolic (congestive) heart failure: Secondary | ICD-10-CM

## 2018-08-22 DIAGNOSIS — I10 Essential (primary) hypertension: Secondary | ICD-10-CM

## 2018-08-22 MED ORDER — LOSARTAN POTASSIUM-HCTZ 100-25 MG PO TABS: 1.0000 | ORAL_TABLET | Freq: Every day | ORAL | 3 refills | Status: DC

## 2018-08-22 NOTE — Patient Instructions (Addendum)
Dietary changes needed for weight loss  Medication Instructions:  No changes  If you need a refill on your cardiac medications before your next appointment, please call your pharmacy.    Lab work: No new labs needed   If you have labs (blood work) drawn today and your tests are completely normal, you will receive your results only by: Marland Kitchen. MyChart Message (if you have MyChart) OR . A paper copy in the mail If you have any lab test that is abnormal or we need to change your treatment, we will call you to review the results.   Testing/Procedures: No new testing needed   Follow-Up: At [facility], you and your health needs are our priority.  As part of our continuing mission to provide you with exceptional heart care, we have created designated Provider Care Teams.  These Care Teams include your primary Cardiologist (physician) and Advanced Practice Providers (APPs -  Physician Assistants and Nurse Practitioners) who all work together to provide you with the care you need, when you need it.  . You will need a follow up appointment in 12 months .   Please call our office 2 months in advance to schedule this appointment.    . Providers on your designated Care Team:   . Nicolasa Duckinghristopher Berge, NP . Eula Listenyan Dunn, PA-C . Marisue IvanJacquelyn Visser, PA-C  Any Other Special Instructions Will Be Listed Below (If Applicable).  For educational health videos Log in to : [email or web address] Or : [email or web address], password : [email or web address]

## 2018-09-15 ENCOUNTER — Other Ambulatory Visit: Payer: Self-pay | Admitting: Internal Medicine

## 2018-09-15 MED ORDER — HYDROCODONE-ACETAMINOPHEN 10-325 MG PO TABS: 1.0000 | ORAL_TABLET | Freq: Three times a day (TID) | ORAL | 0 refills | Status: DC | PRN

## 2018-09-23 ENCOUNTER — Other Ambulatory Visit: Payer: Self-pay | Admitting: Cardiovascular Disease

## 2018-09-25 ENCOUNTER — Telehealth: Payer: Self-pay

## 2018-09-25 NOTE — Telephone Encounter (Signed)
Patient called back verifying that he takes amiodarone 200MG twice a day.  Made him aware that I would be sending refills in at this time 

## 2018-09-25 NOTE — Telephone Encounter (Signed)
LMOV for patient to call us back.   I need to verify how patient is taking Amiodarone before a refill can be sent in.  

## 2018-09-25 NOTE — Telephone Encounter (Signed)
Patient called back verifying that he takes amiodarone 200MG  twice a day.  Made him aware that I would be sending refills in at this time

## 2018-09-25 NOTE — Telephone Encounter (Signed)
LMOV for patient to call us back.   I need to verify how patient is taking Amiodarone before a refill can be sent in.

## 2018-09-28 ENCOUNTER — Ambulatory Visit: Payer: BLUE CROSS/BLUE SHIELD | Admitting: Internal Medicine

## 2018-09-28 ENCOUNTER — Encounter: Payer: Self-pay | Admitting: Internal Medicine

## 2018-09-28 VITALS — BP 152/84 | HR 81 | Temp 98.3°F | Resp 16 | Ht 75.0 in | Wt 392.4 lb

## 2018-09-28 DIAGNOSIS — R350 Frequency of micturition: Secondary | ICD-10-CM | POA: Diagnosis not present

## 2018-09-28 DIAGNOSIS — N410 Acute prostatitis: Secondary | ICD-10-CM

## 2018-09-28 DIAGNOSIS — M159 Polyosteoarthritis, unspecified: Secondary | ICD-10-CM

## 2018-09-28 DIAGNOSIS — I10 Essential (primary) hypertension: Secondary | ICD-10-CM | POA: Diagnosis not present

## 2018-09-28 DIAGNOSIS — E7849 Other hyperlipidemia: Secondary | ICD-10-CM

## 2018-09-28 DIAGNOSIS — F330 Major depressive disorder, recurrent, mild: Secondary | ICD-10-CM

## 2018-09-28 LAB — POCT URINALYSIS DIPSTICK
Bilirubin, UA: NEGATIVE
Blood, UA: POSITIVE
GLUCOSE UA: NEGATIVE
KETONES UA: NEGATIVE
NITRITE UA: POSITIVE
Protein, UA: NEGATIVE
Urobilinogen, UA: 0.2 E.U./dL
pH, UA: 6 (ref 5.0–8.0)

## 2018-09-28 MED ORDER — HYDROCODONE-ACETAMINOPHEN 10-325 MG PO TABS: 1.0000 | ORAL_TABLET | Freq: Three times a day (TID) | ORAL | 0 refills | Status: DC | PRN

## 2018-09-28 MED ORDER — HYDROCODONE-ACETAMINOPHEN 10-325 MG PO TABS: 1.0000 | ORAL_TABLET | Freq: Four times a day (QID) | ORAL | 0 refills | Status: DC | PRN

## 2018-09-28 NOTE — Patient Instructions (Signed)
Your vicodin has been refilled for Jan,  Feb and March #90 tablets each  Your depression/fatigue may respond to an increase in your wellbutrin dose to 450 mg daily  Other factors that medication cannot fix:  Not enough natural sunshine on a daily basis  Not enough physical activity on a daily basis Not enough mental stimulation  Untreated sleep apnea    Referral to URology  is in process    Benign Prostatic Hyperplasia  Benign prostatic hyperplasia (BPH) is an enlarged prostate gland that is caused by the normal aging process and not by cancer. The prostate is a walnut-sized gland that is involved in the production of semen. It is located in front of the rectum and below the bladder. The bladder stores urine and the urethra is the tube that carries the urine out of the body. The prostate may get bigger as a man gets older. An enlarged prostate can press on the urethra. This can make it harder to pass urine. The build-up of urine in the bladder can cause infection. Back pressure and infection may progress to bladder damage and kidney (renal) failure. What are the causes? This condition is part of a normal aging process. However, not all men develop problems from this condition. If the prostate enlarges away from the urethra, urine flow will not be blocked. If it enlarges toward the urethra and compresses it, there will be problems passing urine. What increases the risk? This condition is more likely to develop in men over the age of 50 years. What are the signs or symptoms? Symptoms of this condition include:  Getting up often during the night to urinate.  Needing to urinate frequently during the day.  Difficulty starting urine flow.  Decrease in size and strength of your urine stream.  Leaking (dribbling) after urinating.  Inability to pass urine. This needs immediate treatment.  Inability to completely empty your bladder.  Pain when you pass urine. This is more common if  there is also an infection.  Urinary tract infection (UTI). How is this diagnosed? This condition is diagnosed based on your medical history, a physical exam, and your symptoms. Tests will also be done, such as:  A post-void bladder scan. This measures any amount of urine that may remain in your bladder after you finish urinating.  A digital rectal exam. In a rectal exam, your health care provider checks your prostate by putting a lubricated, gloved finger into your rectum to feel the back of your prostate gland. This exam detects the size of your gland and any abnormal lumps or growths.  An exam of your urine (urinalysis).  A prostate specific antigen (PSA) screening. This is a blood test used to screen for prostate cancer.  An ultrasound. This test uses sound waves to electronically produce a picture of your prostate gland. Your health care provider may refer you to a specialist in kidney and prostate diseases (urologist). How is this treated? Once symptoms begin, your health care provider will monitor your condition (active surveillance or watchful waiting). Treatment for this condition will depend on the severity of your condition. Treatment may include:  Observation and yearly exams. This may be the only treatment needed if your condition and symptoms are mild.  Medicines to relieve your symptoms, including: ? Medicines to shrink the prostate. ? Medicines to relax the muscle of the prostate.  Surgery in severe cases. Surgery may include: ? Prostatectomy. In this procedure, the prostate tissue is removed completely through an open  incision or with a laparascope or robotics. ? Transurethral resection of the prostate (TURP). In this procedure, a tool is inserted through the opening at the tip of the penis (urethra). It is used to cut away tissue of the inner core of the prostate. The pieces are removed through the same opening of the penis. This removes the blockage. ? Transurethral  incision (TUIP). In this procedure, small cuts are made in the prostate. This lessens the prostate's pressure on the urethra. ? Transurethral microwave thermotherapy (TUMT). This procedure uses microwaves to create heat. The heat destroys and removes a small amount of prostate tissue. ? Transurethral needle ablation (TUNA). This procedure uses radio frequencies to destroy and remove a small amount of prostate tissue. ? Interstitial laser coagulation (ILC). This procedure uses a laser to destroy and remove a small amount of prostate tissue. ? Transurethral electrovaporization (TUVP). This procedure uses electrodes to destroy and remove a small amount of prostate tissue. ? Prostatic urethral lift. This procedure inserts an implant to push the lobes of the prostate away from the urethra. Follow these instructions at home:  Take over-the-counter and prescription medicines only as told by your health care provider.  Monitor your symptoms for any changes. Contact your health care provider with any changes.  Avoid drinking large amounts of liquid before going to bed or out in public.  Avoid or reduce how much caffeine or alcohol you drink.  Give yourself time when you urinate.  Keep all follow-up visits as told by your health care provider. This is important. Contact a health care provider if:  You have unexplained back pain.  Your symptoms do not get better with treatment.  You develop side effects from the medicine you are taking.  Your urine becomes very dark or has a bad smell.  Your lower abdomen becomes distended and you have trouble passing your urine. Get help right away if:  You have a fever or chills.  You suddenly cannot urinate.  You feel lightheaded, or very dizzy, or you faint.  There are large amounts of blood or clots in the urine.  Your urinary problems become hard to manage.  You develop moderate to severe low back or flank pain. The flank is the side of your body  between the ribs and the hip. These symptoms may represent a serious problem that is an emergency. Do not wait to see if the symptoms will go away. Get medical help right away. Call your local emergency services (911 in the [place] Do not drive yourself to the hospital. Summary  Benign prostatic hyperplasia (BPH) is an enlarged prostate that is caused by the normal aging process and not by cancer.  An enlarged prostate can press on the urethra. This can make it hard to pass urine.  This condition is part of a normal aging process and is more likely to develop in men over the age of 50 years.  Get help right away if you suddenly cannot urinate. This information is not intended to replace advice given to you by your health care provider. Make sure you discuss any questions you have with your health care provider. Document Released: 09/27/2005 Document Revised: 11/01/2016 Document Reviewed: 11/01/2016 Elsevier Interactive Patient Education  2019 [employer]

## 2018-09-28 NOTE — Progress Notes (Signed)
Subjective:  Patient ID: Johnathan [name], male    DOB: 06-04-1956  Age: 62 y.o. MRN: [id number]  CC: The primary encounter diagnosis was Essential hypertension. 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  HPI [name] presents for follow up on multiple issues.  chronic pain managed with Vicodin  Due to C/I to NSAIDS   Positive depression screen.  sympto are  worse during winter months . toleratig wellbutrin has plenty at home currently taking 300 mg currenlty   Sleeping too much.  He is  in bed 11:30  Woken up at 5 by working wife.  Goes back to sleep . Untreated OSA  1)  Urinary frequency  For the past year,  Every 90 minutes throughout the night and day.  Stream is not weak,  Some dribbling .  Also having ED .  Wants to see Urology after Jan 1.   .History of UTI 6 months ago  , treated  No results found for: PSA   Outpatient Medications Prior to Visit  Medication Sig Dispense Refill  . amiodarone (PACERONE) 200 MG tablet TAKE 1 TABLET BY MOUTH TWO  TIMES DAILY 180 tablet 1  . B Complex Vitamins (B COMPLEX 50 PO) Take 1 tablet by mouth daily.     . Bisacodyl (DULCOLAX PO) Take by mouth daily.    Marland Kitchen buPROPion (WELLBUTRIN XL) 150 MG 24 hr tablet TAKE 1 TABLET BY MOUTH  DAILY ALTERNATING WITH 2  TABLETS BY MOUTH DAILY 135 tablet 3  . buPROPion (WELLBUTRIN XL) 300 MG 24 hr tablet Take 1 tablet (300 mg total) by mouth daily. One tablet daily alternating with 2 90 tablet 0  . Cholecalciferol (VITAMIN D3) 2000 UNITS TABS Take 1 tablet by mouth daily.     . cyclobenzaprine (FLEXERIL) 10 MG tablet Take 1 tablet (10 mg total) by mouth 3 (three) times daily as needed for muscle spasms. 30 tablet 0  . docusate sodium (DULCOLAX) 100 MG capsule Take 400 mg by mouth daily.     .  Ferrous Gluconate 324 (37.5 Fe) MG TABS Take 1 tablet by mouth daily.     . furosemide (LASIX) 40 MG tablet Take 1 tablet (40 mg total) by mouth 2 (two) times daily as needed. 180 tablet 3  . gabapentin (NEURONTIN) 300 MG capsule Take 1 capsule (300 mg total) by mouth 2 (two) times daily. 180 capsule 2  . GLUCOSAMINE HCL-MSM PO Take 1 tablet by mouth daily.     Marland Kitchen levothyroxine (SYNTHROID, LEVOTHROID) 25 MCG tablet TAKE 1 TABLET BY MOUTH  DAILY BEFORE BREAKFAST 90 tablet 1  . losartan-hydrochlorothiazide (HYZAAR) 100-25 MG tablet Take 1 tablet by mouth daily. 90 tablet 3  . metoprolol succinate (TOPROL-XL) 25 MG 24 hr tablet Take 25 mg by mouth daily.    . Multiple Vitamin (MULTIVITAMIN) tablet Take 1 tablet by mouth daily.    . nitroGLYCERIN (NITROSTAT) 0.4 MG SL tablet Place 1 tablet (0.4 mg total) under the tongue every 5 (five) minutes as needed for chest pain. 30 tablet 3  . Potassium Chloride ER 20 MEQ TBCR Take 20 mEq by mouth 2 (two) times daily as needed. 180 tablet 3  . PRADAXA 150 MG CAPS capsule TAKE 1 CAPSULE BY MOUTH 2 TIMES DAILY 180 capsule 3  . propranolol (INDERAL) 20 MG tablet Take  20 mg by mouth 3 (three) times daily as needed. Reported on 03/22/2016    . venlafaxine XR (EFFEXOR-XR) 37.5 MG 24 hr capsule TAKE 1 CAPSULE BY MOUTH  DAILY WITH BREAKFAST 90 capsule 1  . vitamin B-12 (CYANOCOBALAMIN) 500 MCG tablet Take 500 mcg by mouth daily.    . vitamin E 400 UNIT capsule Take 400 Units by mouth daily.    Marland Kitchen. HYDROcodone-acetaminophen (NORCO) 10-325 MG tablet Take 1 tablet by mouth every 6 (six) hours as needed. 120 tablet 0  . HYDROcodone-acetaminophen (NORCO) 10-325 MG tablet Take 1 tablet by mouth every 6 (six) hours as needed. 120 tablet 0  . HYDROcodone-acetaminophen (NORCO) 10-325 MG tablet Take 1 tablet by mouth every 8 (eight) hours as needed for severe pain. 90 tablet 0   No facility-administered medications prior to visit.     Review of Systems;  Patient denies  headache, fevers, malaise, unintentional weight loss, skin rash, eye pain, sinus congestion and sinus pain, sore throat, dysphagia,  hemoptysis , cough, dyspnea, wheezing, chest pain, palpitations, orthopnea, edema, abdominal pain, nausea, melena, diarrhea, constipation, flank pain, dysuria, hematuria, urinary  Frequency, nocturia, numbness, tingling, seizures,  Focal weakness, Loss of consciousness,  Tremor, insomnia, depression, anxiety, and suicidal ideation.      Objective:  BP (!) 152/84 (BP Location: Left Arm, Patient Position: Sitting, Cuff Size: Large)   Pulse 81   Temp 98.3 F (36.8 C) (Oral)   Resp 16   Ht 6\' 3"  (1.905 m)   Wt (!) 392 lb 6.4 oz (178 kg)   SpO2 95%   BMI 49.05 kg/m   BP Readings from Last 3 Encounters:  09/28/18 (!) 152/84  08/22/18 (!) 155/93  06/29/18 (!) 142/90    Wt Readings from Last 3 Encounters:  09/28/18 (!) 392 lb 6.4 oz (178 kg)  08/22/18 (!) 391 lb 4 oz (177.5 kg)  06/29/18 (!) 381 lb 6.4 oz (173 kg)    General appearance: alert, cooperative and appears stated age Ears: normal TM's and external ear canals both ears Throat: lips, mucosa, and tongue normal; teeth and gums normal Neck: no adenopathy, no carotid bruit, supple, symmetrical, trachea midline and thyroid not enlarged, symmetric, no tenderness/mass/nodules Back: symmetric, no curvature. ROM normal. No CVA tenderness. Lungs: clear to auscultation bilaterally Heart: regular rate and rhythm, S1, S2 normal, no murmur, click, rub or gallop Abdomen: soft, non-tender; bowel sounds normal; no masses,  no organomegaly Pulses: 2+ and symmetric Skin: Skin color, texture, turgor normal. No rashes or lesions Lymph nodes: Cervical, supraclavicular, and axillary nodes normal.  Lab Results  Component Value Date   HGBA1C 5.6 03/29/2018   HGBA1C 5.0 03/29/2016    Lab Results  Component Value Date   CREATININE 1.13 09/28/2018   CREATININE 1.17 03/29/2018   CREATININE 1.19 12/27/2017     Lab Results  Component Value Date   WBC 8.7 12/27/2017   HGB 14.6 12/27/2017   HCT 42.6 12/27/2017   PLT 193 12/27/2017   GLUCOSE 84 09/28/2018   CHOL 182 09/28/2018   TRIG 204 (H) 09/28/2018   HDL 45 09/28/2018   LDLCALC 96 09/28/2018   ALT 26 09/28/2018   AST 23 09/28/2018   NA 144 09/28/2018   K 4.8 09/28/2018   CL 103 09/28/2018   CREATININE 1.13 09/28/2018   BUN 15 09/28/2018   CO2 23 09/28/2018   TSH 4.360 03/29/2018   INR 1.21 06/09/2017   HGBA1C 5.6 03/29/2018    No results found.  Assessment &  Plan:   Problem List Items Addressed This Visit    Acute bacterial prostatitis    Secondary to cystitis.  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 Marland Kitchen  His Refill history was confirmed via [place] Controlled Substance database by me today.   I have refilled his hydrocodone for 3 months,  But I have reduced his dose to 3 daily and advised hiim to increase tylenol use.  NSAIDs are contraindicated due to history of gastric bypass       Relevant Medications   HYDROcodone-acetaminophen (NORCO) 10-325 MG tablet (Start on 12/13/2018)   HYDROcodone-acetaminophen (NORCO) 10-325 MG tablet (Start on 10/15/2018)   HYDROcodone-acetaminophen (NORCO) 10-325 MG tablet (Start on 11/14/2018)   Essential hypertension - Primary    he reports compliance with medication regimen  but has multiple elevated readings today in office.  She is not using NSAIDs daily.  Discussed goal of 120/70  (130/80 for patients over 70)  to preserve renal function.  She has been asked to check her  BP  at home and   submit readings for evaluation. Renal function, electrolytes and screen for proteinuria are all normal .  Lab Results  Component Value Date   CREATININE 1.13 09/28/2018   No results found for: Concepcion Elk Lab Results  Component Value Date   NA 144 09/28/2018   K 4.8 09/28/2018   CL 103 09/28/2018   CO2 23 09/28/2018         Relevant Orders   Lipid panel (Completed)   Comprehensive metabolic panel (Completed)   Hyperlipidemia    Untreated lipids done today were notable for an LDL < 100 and HDL > 39 .  His ten year risk of CAD is 30% .  statin advised.    Lab Results  Component Value Date   CHOL 182 09/28/2018   HDL 45 09/28/2018   LDLCALC 96 09/28/2018   TRIG 204 (H) 09/28/2018   CHOLHDL 4.0 09/28/2018         Major depressive disorder with current active episode    Aggravated by winter season,  Increasing wellbutrn dose to 450 mg daily and encouraged to spend 15 minutes outside in the sun daily       Urinary frequency   Relevant Orders   POCT urinalysis dipstick (Completed)   Urine Culture (Completed)   Urine Microscopic Only (Completed)      I am having Raimundo A. Riccobono maintain his Ferrous Gluconate, multivitamin, B Complex Vitamins (B COMPLEX 50 PO), vitamin B-12, Vitamin D3, GLUCOSAMINE HCL-MSM PO, vitamin E, nitroGLYCERIN, furosemide, Potassium Chloride ER, propranolol, docusate sodium, cyclobenzaprine, PRADAXA, buPROPion, levothyroxine, gabapentin, buPROPion, venlafaxine XR, Bisacodyl (DULCOLAX PO), metoprolol succinate, losartan-hydrochlorothiazide, amiodarone, HYDROcodone-acetaminophen, HYDROcodone-acetaminophen, and HYDROcodone-acetaminophen.  Meds ordered this encounter  Medications  . HYDROcodone-acetaminophen (NORCO) 10-325 MG tablet    Sig: Take 1 tablet by mouth every 8 (eight) hours as needed for severe pain.    Dispense:  90 tablet    Refill:  0  . DISCONTD: HYDROcodone-acetaminophen (NORCO) 10-325 MG tablet  Sig: Take 1 tablet by  mouth every 6 (six) hours as needed.    Dispense:  90 tablet    Refill:  0    May refill on or After Nov 14 2018  . HYDROcodone-acetaminophen (NORCO) 10-325 MG tablet    Sig: Take 1 tablet by mouth every 6 (six) hours as needed.    Dispense:  90 tablet    Refill:  0    May refill on or After Oct 15 2018  . HYDROcodone-acetaminophen (NORCO) 10-325 MG tablet    Sig: Take 1 tablet by mouth every 6 (six) hours as needed.    Dispense:  90 tablet    Refill:  0    May refill on or After Nov 14 2018    Medications Discontinued During This Encounter  Medication Reason  . HYDROcodone-acetaminophen (NORCO) 10-325 MG tablet Reorder  . HYDROcodone-acetaminophen (NORCO) 10-325 MG tablet Reorder  . HYDROcodone-acetaminophen (NORCO) 10-325 MG tablet Reorder  . HYDROcodone-acetaminophen (NORCO) 10-325 MG tablet Reorder    Follow-up: No follow-ups on file.   Sherlene Shamseresa L Felicity Penix, MD

## 2018-09-29 LAB — LIPID PANEL
CHOLESTEROL TOTAL: 182 mg/dL (ref 100–199)
Chol/HDL Ratio: 4 ratio (ref 0.0–5.0)
HDL: 45 mg/dL (ref >39)
LDL CALC: 96 mg/dL (ref 0–99)
TRIGLYCERIDES: 204 mg/dL — AB (ref 0–149)
VLDL CHOLESTEROL CAL: 41 mg/dL — AB (ref 5–40)

## 2018-09-29 LAB — COMPREHENSIVE METABOLIC PANEL
ALBUMIN: 4.6 g/dL (ref 3.6–4.8)
ALT: 26 IU/L (ref 0–44)
AST: 23 IU/L (ref 0–40)
Albumin/Globulin Ratio: 1.8 (ref 1.2–2.2)
Alkaline Phosphatase: 113 IU/L (ref 39–117)
BUN / CREAT RATIO: 13 (ref 10–24)
BUN: 15 mg/dL (ref 8–27)
Bilirubin Total: 0.4 mg/dL (ref 0.0–1.2)
CO2: 23 mmol/L (ref 20–29)
Calcium: 9 mg/dL (ref 8.6–10.2)
Chloride: 103 mmol/L (ref 96–106)
Creatinine, Ser: 1.13 mg/dL (ref 0.76–1.27)
GFR calc Af Amer: 80 mL/min/{1.73_m2} (ref >59)
GFR calc non Af Amer: 69 mL/min/{1.73_m2} (ref >59)
Globulin, Total: 2.6 g/dL (ref 1.5–4.5)
Glucose: 84 mg/dL (ref 65–99)
Potassium: 4.8 mmol/L (ref 3.5–5.2)
Sodium: 144 mmol/L (ref 134–144)
Total Protein: 7.2 g/dL (ref 6.0–8.5)

## 2018-09-29 LAB — URINALYSIS, MICROSCOPIC ONLY
CASTS: NONE SEEN /LPF
WBC, UA: >30 /hpf — AB (ref 0–5)

## 2018-10-01 ENCOUNTER — Encounter: Payer: Self-pay | Admitting: Internal Medicine

## 2018-10-01 DIAGNOSIS — N410 Acute prostatitis: Secondary | ICD-10-CM | POA: Insufficient documentation

## 2018-10-01 LAB — URINE CULTURE

## 2018-10-01 MED ORDER — CIPROFLOXACIN HCL 500 MG PO TABS: 500.0000 mg | ORAL_TABLET | Freq: Two times a day (BID) | ORAL | 0 refills | Status: DC

## 2018-10-01 NOTE — Addendum Note (Signed)
Addended by: Sherlene ShamsULLO, Tamira Ryland L on: 10/01/2018 09:45 AM   Modules accepted: Orders

## 2018-10-01 NOTE — Assessment & Plan Note (Addendum)
Untreated lipids done today were notable for an LDL < 100 and HDL > 39 .  His ten year risk of CAD is 30% .  statin advised.    Lab Results  Component Value Date   CHOL 182 09/28/2018   HDL 45 09/28/2018   LDLCALC 96 09/28/2018   TRIG 204 (H) 09/28/2018   CHOLHDL 4.0 09/28/2018

## 2018-10-01 NOTE — Assessment & Plan Note (Signed)
Aggravated by winter season,  Increasing wellbutrn dose to 450 mg daily and encouraged to spend 15 minutes outside in the sun daily

## 2018-10-01 NOTE — Assessment & Plan Note (Addendum)
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 Marland Kitchen.  His Refill history was confirmed via [place] Controlled Substance database by me today.   I have refilled his hydrocodone for 3 months,  But I have reduced his dose to 3 daily and advised hiim to increase tylenol use.  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

## 2018-10-01 NOTE — Assessment & Plan Note (Signed)
he reports compliance with medication regimen  but has multiple elevated readings today in office.  She is not using NSAIDs daily.  Discussed goal of 120/70  (130/80 for patients over 70)  to preserve renal function.  She has been asked to check her  BP  at home and  submit readings for evaluation. Renal function, electrolytes and screen for proteinuria are all normal .  Lab Results  Component Value Date   CREATININE 1.13 09/28/2018   No results found for: Concepcion ElkMICROALBUR, [id number] Lab Results  Component Value Date   NA 144 09/28/2018   K 4.8 09/28/2018   CL 103 09/28/2018   CO2 23 09/28/2018

## 2018-10-01 NOTE — Assessment & Plan Note (Addendum)
Secondary to infection with  E Coli sensitive to cipro .  Sending 2 week treatment to pharmacy. patient notified via Mychart,  Probiotic advised

## 2018-10-16 ENCOUNTER — Other Ambulatory Visit: Payer: Self-pay | Admitting: *Deleted

## 2018-10-16 ENCOUNTER — Telehealth: Payer: Self-pay | Admitting: Cardiovascular Disease

## 2018-10-16 MED ORDER — DABIGATRAN ETEXILATE MESYLATE 150 MG PO CAPS: ORAL_CAPSULE | ORAL | 3 refills | Status: DC

## 2018-10-16 NOTE — Telephone Encounter (Signed)
Requested Prescriptions   Signed Prescriptions Disp Refills  . dabigatran (PRADAXA) 150 MG CAPS capsule 180 capsule 3    Sig: TAKE 1 CAPSULE BY MOUTH 2 TIMES DAILY    Authorizing Provider: Antonieta Iba    Ordering User: Kendrick Fries

## 2018-10-16 NOTE — Telephone Encounter (Signed)
°*  STAT* If patient is at the pharmacy, call can be transferred to refill team.   1. Which medications need to be refilled? (please list name of each medication and dose if known) Pradaxa 150 MG - 1 capsule 2 times daily   2. Which pharmacy/location (including street and city if local pharmacy) is medication to be sent to? [facility] in [place]  3. Do they need a 30 day or 90 day supply? 90 day

## 2018-10-16 NOTE — Telephone Encounter (Signed)
Requested Prescriptions   Signed Prescriptions Disp Refills  . dabigatran (PRADAXA) 150 MG CAPS capsule 180 capsule 3    Sig: TAKE 1 CAPSULE BY MOUTH 2 TIMES DAILY    Authorizing Provider: GOLLAN, TIMOTHY J    Ordering User: Alana Dayton C    

## 2018-10-18 ENCOUNTER — Other Ambulatory Visit: Payer: Self-pay | Admitting: Internal Medicine

## 2018-10-18 NOTE — Telephone Encounter (Signed)
Requested medication (s) are due for refill today - yes  Requested medication (s) are on the active medication list -yes  Future visit scheduled -yes  Last refill: 08/09/18  Notes to clinic: Rx needs to be adjusted to new dosing from last visit- patient instructed to take 450 mg daily-see note.   Requested Prescriptions  Pending Prescriptions Disp Refills   buPROPion (WELLBUTRIN XL) 300 MG 24 hr tablet 90 tablet 0    Sig: Take 1 tablet (300 mg total) by mouth daily. One tablet daily alternating with 2     Psychiatry: Antidepressants - bupropion Failed - 10/18/2018 10:05 AM      Failed - Last BP in normal range    BP Readings from Last 1 Encounters:  09/28/18 (!) 152/84         Passed - Completed PHQ-2 or PHQ-9 in the last 360 days.      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            Requested Prescriptions  Pending Prescriptions Disp Refills   buPROPion (WELLBUTRIN XL) 300 MG 24 hr tablet 90 tablet 0    Sig: Take 1 tablet (300 mg total) by mouth daily. One tablet daily alternating with 2     Psychiatry: Antidepressants - bupropion Failed - 10/18/2018 10:05 AM      Failed - Last BP in normal range    BP Readings from Last 1 Encounters:  09/28/18 (!) 152/84         Passed - Completed PHQ-2 or PHQ-9 in the  last 360 days.      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

## 2018-10-18 NOTE — Telephone Encounter (Signed)
Copied from CRM [id number]. Topic: Quick Communication - Rx Refill/Question >> Oct 18, 2018  9:20 AM Burchel, Abbi R wrote: Medication: buPROPion (WELLBUTRIN XL) 300 MG 24 hr tablet  Preferred Pharmacy: [facility] [place],  - [address]  [phone] (Phone) [phone] (Fax)  *Please note pt is completely out of this medication  Pt was advised that RX refills may take up to 3 business days. We ask that you follow-up with your pharmacy.

## 2018-10-22 ENCOUNTER — Other Ambulatory Visit: Payer: Self-pay | Admitting: Internal Medicine

## 2018-10-22 MED ORDER — BUPROPION HCL ER (XL) 150 MG PO TB24: 450.0000 mg | ORAL_TABLET | Freq: Every day | ORAL | 3 refills | Status: DC

## 2018-11-17 ENCOUNTER — Other Ambulatory Visit: Payer: Self-pay | Admitting: Internal Medicine

## 2018-12-03 ENCOUNTER — Other Ambulatory Visit: Payer: Self-pay | Admitting: Internal Medicine

## 2018-12-15 ENCOUNTER — Encounter: Payer: Self-pay | Admitting: Internal Medicine

## 2018-12-15 ENCOUNTER — Ambulatory Visit: Payer: BLUE CROSS/BLUE SHIELD | Admitting: Internal Medicine

## 2018-12-15 DIAGNOSIS — M159 Polyosteoarthritis, unspecified: Secondary | ICD-10-CM

## 2018-12-15 DIAGNOSIS — I10 Essential (primary) hypertension: Secondary | ICD-10-CM | POA: Diagnosis not present

## 2018-12-15 DIAGNOSIS — F330 Major depressive disorder, recurrent, mild: Secondary | ICD-10-CM | POA: Diagnosis not present

## 2018-12-15 MED ORDER — HYDROCODONE-ACETAMINOPHEN 10-325 MG PO TABS: 1.0000 | ORAL_TABLET | Freq: Four times a day (QID) | ORAL | 0 refills | Status: DC | PRN

## 2018-12-15 MED ORDER — HYDROCODONE-ACETAMINOPHEN 10-325 MG PO TABS: 1.0000 | ORAL_TABLET | Freq: Three times a day (TID) | ORAL | 0 refills | Status: DC | PRN

## 2018-12-15 NOTE — Progress Notes (Signed)
Subjective:  Patient ID: Johnathan [name], male    DOB: May 18, 1956  Age: 63 y.o. 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  HPI [name] presents for medication refill .  Patient has chronic pain secondary to DJD involving several joints, aggravated by morbid obesity,  And peripheral neuropathy.  He is unable to take NSAIDs due to history of bariatric surgery and chronic  anticoagulation   NEUROPATHY WORSENING. Has had several minor falls in the yard.  He attributes the falls to stumbling and tripping due to foot  Drop and uneven terrain  . REFUSES TO SEE NEUROLOGY FOR FOOT BRACES .  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 . Coincided with elevated blood pressure  With home readings of  151/80 and irregular pulse   Outpatient Medications Prior to Visit  Medication Sig Dispense Refill  . amiodarone (PACERONE) 200 MG tablet TAKE 1 TABLET BY MOUTH TWO  TIMES DAILY 180 tablet 1  . B Complex Vitamins (B COMPLEX 50 PO) Take 1 tablet by mouth daily.     Marland Kitchen buPROPion (WELLBUTRIN XL) 150 MG 24 hr tablet Take 3 tablets (450 mg total) by mouth daily. 270 tablet 3  . Cholecalciferol (VITAMIN D3) 2000 UNITS TABS Take 1 tablet by mouth daily.     . cyclobenzaprine (FLEXERIL) 10 MG tablet Take 1 tablet (10 mg total) by mouth 3 (three) times daily as needed for muscle spasms. 30 tablet 0  . dabigatran (PRADAXA) 150 MG CAPS capsule TAKE 1 CAPSULE BY MOUTH 2 TIMES DAILY 180 capsule 3  . docusate sodium (DULCOLAX) 100 MG capsule Take 400 mg by mouth daily.     . Ferrous Gluconate 324 (37.5 Fe) MG  TABS Take 1 tablet by mouth daily.     . furosemide (LASIX) 40 MG tablet Take 1 tablet (40 mg total) by mouth 2 (two) times daily as needed. 180 tablet 3  . gabapentin (NEURONTIN) 300 MG capsule Take 1 capsule (300 mg total) by mouth 2 (two) times daily. 180 capsule 2  . GLUCOSAMINE HCL-MSM PO Take 1 tablet by mouth daily.     Marland Kitchen levothyroxine (SYNTHROID, LEVOTHROID) 25 MCG tablet TAKE 1 TABLET BY MOUTH  DAILY BEFORE BREAKFAST 90 tablet 1  . losartan-hydrochlorothiazide (HYZAAR) 100-25 MG tablet Take 1 tablet by mouth daily. 90 tablet 3  . metoprolol succinate (TOPROL-XL) 25 MG 24 hr tablet TAKE 1 TABLET(25 MG) BY MOUTH DAILY 90 tablet 1  . Multiple Vitamin (MULTIVITAMIN) tablet Take 1 tablet by mouth daily.    . nitroGLYCERIN (NITROSTAT) 0.4 MG SL tablet Place 1 tablet (0.4 mg total) under the tongue every 5 (five) minutes as needed for chest pain. 30 tablet 3  . Potassium Chloride ER 20 MEQ TBCR Take 20 mEq by mouth 2 (two) times daily as needed. 180 tablet 3  . propranolol (INDERAL) 20 MG tablet Take 20 mg by mouth 3 (three) times daily as needed. Reported on 03/22/2016    . venlafaxine XR (EFFEXOR-XR) 37.5 MG 24 hr capsule TAKE 1 CAPSULE BY MOUTH  DAILY WITH BREAKFAST 90 capsule  1  . vitamin B-12 (CYANOCOBALAMIN) 500 MCG tablet Take 500 mcg by mouth daily.    . vitamin E 400 UNIT capsule Take 400 Units by mouth daily.    Marland Kitchen HYDROcodone-acetaminophen (NORCO) 10-325 MG tablet Take 1 tablet by mouth every 8 (eight) hours as needed for severe pain. 90 tablet 0  . HYDROcodone-acetaminophen (NORCO) 10-325 MG tablet Take 1 tablet by mouth every 6 (six) hours as needed. 90 tablet 0  . HYDROcodone-acetaminophen (NORCO) 10-325 MG tablet Take 1 tablet by mouth every 6 (six) hours as needed. 90 tablet 0  . Bisacodyl (DULCOLAX PO) Take by mouth daily.    Marland Kitchen buPROPion (WELLBUTRIN XL) 300 MG 24 hr tablet Take 1 tablet (300 mg total) by mouth daily. One tablet daily alternating with 2 (Patient not taking: Reported  on 12/15/2018) 90 tablet 0  . ciprofloxacin (CIPRO) 500 MG tablet Take 1 tablet (500 mg total) by mouth 2 (two) times daily. (Patient not taking: Reported on 12/15/2018) 28 tablet 0   No facility-administered medications prior to visit.     Review of Systems;  Patient denies headache, fevers, malaise, unintentional weight loss, skin rash, eye pain, sinus congestion and sinus pain, sore throat, dysphagia,  hemoptysis , cough, dyspnea, wheezing, chest pain, palpitations, orthopnea, edema, abdominal pain, nausea, melena, diarrhea, constipation, flank pain, dysuria, hematuria, urinary  Frequency, nocturia, numbness, tingling, seizures,  Focal weakness, Loss of consciousness,  Tremor, insomnia, depression, anxiety, and suicidal ideation.      Objective:  BP (!) 142/88 (BP Location: Left Arm, Patient Position: Sitting, Cuff Size: Large)   Pulse 71   Temp (!) 97.4 F (36.3 C) (Oral)   Resp 16   Ht  (1.905 m)   Wt (!) 395 lb (179.2 kg)   SpO2 96%   BMI 49.37 kg/m   BP Readings from Last 3 Encounters:  12/15/18 (!) 142/88  09/28/18 (!) 152/84  08/22/18 (!) 155/93    Wt Readings from Last 3 Encounters:  12/15/18 (!) 395 lb (179.2 kg)  09/28/18 (!) 392 lb 6.4 oz (178 kg)  08/22/18 (!) 391 lb 4 oz (177.5 kg)    General appearance: alert, cooperative and appears stated age Ears: normal TM's and external ear canals both ears Throat: lips, mucosa, and tongue normal; teeth and gums normal Neck: no adenopathy, no carotid bruit, supple, symmetrical, trachea midline and thyroid not enlarged, symmetric, no tenderness/mass/nodules Back: symmetric, no curvature. ROM normal. No CVA tenderness. Lungs: clear to auscultation bilaterally Heart: regular rate and rhythm, S1, S2 normal, no murmur, click, rub or gallop Abdomen: soft, non-tender; bowel sounds normal; no masses,  no organomegaly Pulses: 2+ and symmetric Skin: Skin color, texture, turgor normal. No rashes or lesions Lymph nodes:  Cervical, supraclavicular, and axillary nodes normal.  Lab Results  Component Value Date   HGBA1C 5.6 03/29/2018   HGBA1C 5.0 03/29/2016    Lab Results  Component Value Date   CREATININE 1.13 09/28/2018   CREATININE 1.17 03/29/2018   CREATININE 1.19 12/27/2017    Lab Results  Component Value Date   WBC 8.7 12/27/2017   HGB 14.6 12/27/2017   HCT 42.6 12/27/2017   PLT 193 12/27/2017   GLUCOSE 84 09/28/2018   CHOL 182 09/28/2018   TRIG 204 (H) 09/28/2018   HDL 45 09/28/2018   LDLCALC 96 09/28/2018   ALT 26 09/28/2018   AST 23 09/28/2018   NA 144 09/28/2018   K 4.8 09/28/2018   CL 103 09/28/2018   CREATININE 1.13 09/28/2018  BUN 15 09/28/2018   CO2 23 09/28/2018   TSH 4.360 03/29/2018   INR 1.21 06/09/2017   HGBA1C 5.6 03/29/2018    No results found.  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 Marland Kitchen  His Refill history was confirmed via [place] Controlled Substance database by me today.   I have refilled his hydrocodone for  Use not more than 3 times daily for March, April and May  and advised hiim to maximize his tylenol use to 2000 mg daily. .  NSAIDs are contraindicated due to history of gastric bypass       Relevant Medications   HYDROcodone-acetaminophen (NORCO) 10-325 MG tablet   HYDROcodone-acetaminophen (NORCO) 10-325 MG tablet (Start on 01/16/2019)   HYDROcodone-acetaminophen (NORCO) 10-325 MG tablet (Start on 02/15/2019)   Essential hypertension    he reports compliance with medication regimen  but has multiple elevated readings today in office.  he is not using  NSAIDs daily.  Discussed goal of 120/70  (130/80 for patients over 70)  to preserve renal function.  he has been asked to check her  BP  at home and  submit readings for evaluation. Renal function, electrolytes and screen for proteinuria are all up to date.  Lab Results  Component Value Date   CREATININE 1.13 09/28/2018   No results found for: Concepcion Elk Lab Results  Component Value Date   NA 144 09/28/2018   K 4.8 09/28/2018   CL 103 09/28/2018   CO2 23 09/28/2018         Major depressive disorder with current active episode    Aggravated by winter season, improved with increase in  wellbutrn dose to 450 mg daily and encouraged to spend 15 minutes outside in the sun daily          I have discontinued Ramon Dredge A. Halfhill's Bisacodyl (DULCOLAX PO) and ciprofloxacin. I have also changed his HYDROcodone-acetaminophen. Additionally, I am having him maintain his Ferrous Gluconate, multivitamin, B Complex Vitamins (B COMPLEX 50 PO), vitamin B-12, Vitamin D3, GLUCOSAMINE HCL-MSM PO, vitamin E, nitroGLYCERIN, furosemide, Potassium Chloride ER, propranolol, docusate sodium, cyclobenzaprine, gabapentin, venlafaxine XR, losartan-hydrochlorothiazide, amiodarone, dabigatran, buPROPion, levothyroxine, metoprolol succinate, HYDROcodone-acetaminophen, and HYDROcodone-acetaminophen.  Meds ordered this encounter  Medications  . DISCONTD: HYDROcodone-acetaminophen (NORCO) 10-325 MG tablet    Sig: Take 1 tablet by mouth every 8 (eight) hours as needed for severe pain.    Dispense:  90 tablet    Refill:  0  . DISCONTD: HYDROcodone-acetaminophen (NORCO) 10-325 MG tablet    Sig: Take 1 tablet by mouth every 6 (six) hours as needed.    Dispense:  90 tablet    Refill:  0    May refill on or After January 12 2019  . DISCONTD: HYDROcodone-acetaminophen (NORCO) 10-325 MG tablet    Sig: Take 1 tablet by mouth every 6 (six) hours as needed.    Dispense:  90 tablet    Refill:  0    May refill on or  After Nov 14 2018  . HYDROcodone-acetaminophen (NORCO)  10-325 MG tablet    Sig: Take 1 tablet by mouth every 8 (eight) hours as needed for severe pain.    Dispense:  90 tablet    Refill:  0  . HYDROcodone-acetaminophen (NORCO) 10-325 MG tablet    Sig: Take 1 tablet by mouth every 8 (eight) hours as needed.    Dispense:  90 tablet    Refill:  0    May refill on or After Oct 15 2018  . HYDROcodone-acetaminophen (NORCO) 10-325 MG tablet    Sig: Take 1 tablet by mouth every 8 (eight) hours as needed for severe pain.    Dispense:  90 tablet    Refill:  0  A total of 25 minutes of face to face time was spent with patient more than half of which was spent in counselling about the above mentioned conditions  and coordination of care   Medications Discontinued During This Encounter  Medication Reason  . Bisacodyl (DULCOLAX PO) Patient has not taken in last 30 days  . buPROPion (WELLBUTRIN XL) 300 MG 24 hr tablet Patient has not taken in last 30 days  . ciprofloxacin (CIPRO) 500 MG tablet Completed Course  . HYDROcodone-acetaminophen (NORCO) 10-325 MG tablet Reorder  . HYDROcodone-acetaminophen (NORCO) 10-325 MG tablet Reorder  . HYDROcodone-acetaminophen (NORCO) 10-325 MG tablet Reorder  . HYDROcodone-acetaminophen (NORCO) 10-325 MG tablet   . HYDROcodone-acetaminophen (NORCO) 10-325 MG tablet   . HYDROcodone-acetaminophen (NORCO) 10-325 MG tablet     Follow-up: Return in about 3 months (around 03/17/2019).   Sherlene Shams, MD

## 2018-12-15 NOTE — Patient Instructions (Addendum)
Your hydrocodone has been refilled through June   The new goals for optimal blood pressure management are 120/70.  Please check your blood pressure a few times at home and send me the readings so I can determine if you need a change in medication     I also want you to take your metoprolol at night instead of morning,  as recent studies have shown that taking your blood pressure medications at night protects you better from heart attacks and strokes.    DO NOT OVERDO IT ON THE TRICEPS AND BICEPS EXERCISES AND GIVE YOURSELF TENDONITIS  ICE ON WARM JOINTS,  HEAT ON COLD JOINTS

## 2018-12-17 MED ORDER — HYDROCODONE-ACETAMINOPHEN 10-325 MG PO TABS: 1.0000 | ORAL_TABLET | Freq: Three times a day (TID) | ORAL | 0 refills | Status: DC | PRN

## 2018-12-17 NOTE — Assessment & Plan Note (Signed)
he reports compliance with medication regimen  but has multiple elevated readings today in office.  he is not using NSAIDs daily.  Discussed goal of 120/70  (130/80 for patients over 70)  to preserve renal function.  he has been asked to check her  BP  at home and  submit readings for evaluation. Renal function, electrolytes and screen for proteinuria are all up to date.  Lab Results  Component Value Date   CREATININE 1.13 09/28/2018   No results found for: Concepcion Elk Lab Results  Component Value Date   NA 144 09/28/2018   K 4.8 09/28/2018   CL 103 09/28/2018   CO2 23 09/28/2018

## 2018-12-17 NOTE — Assessment & Plan Note (Signed)
Aggravated by winter season, improved with increase in  wellbutrn dose to 450 mg daily and encouraged to spend 15 minutes outside in the sun daily

## 2018-12-17 NOTE — Assessment & Plan Note (Addendum)
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 Marland Kitchen  His Refill history was confirmed via [place] Controlled Substance database by me today.   I have refilled his hydrocodone for  Use not more than 3 times daily for March, April and May  and advised hiim to maximize his tylenol use to 2000 mg daily. .  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

## 2019-01-24 ENCOUNTER — Other Ambulatory Visit: Payer: Self-pay | Admitting: Internal Medicine

## 2019-03-18 ENCOUNTER — Other Ambulatory Visit: Payer: Self-pay | Admitting: Cardiovascular Disease

## 2019-03-23 ENCOUNTER — Encounter: Payer: Self-pay | Admitting: Internal Medicine

## 2019-03-23 ENCOUNTER — Ambulatory Visit (INDEPENDENT_AMBULATORY_CARE_PROVIDER_SITE_OTHER): Payer: BC Managed Care – PPO | Admitting: Internal Medicine

## 2019-03-23 ENCOUNTER — Other Ambulatory Visit: Payer: Self-pay

## 2019-03-23 DIAGNOSIS — I10 Essential (primary) hypertension: Secondary | ICD-10-CM | POA: Diagnosis not present

## 2019-03-23 DIAGNOSIS — E7800 Pure hypercholesterolemia, unspecified: Secondary | ICD-10-CM

## 2019-03-23 DIAGNOSIS — Z79899 Other long term (current) drug therapy: Secondary | ICD-10-CM

## 2019-03-23 DIAGNOSIS — M159 Polyosteoarthritis, unspecified: Secondary | ICD-10-CM | POA: Diagnosis not present

## 2019-03-23 DIAGNOSIS — F330 Major depressive disorder, recurrent, mild: Secondary | ICD-10-CM

## 2019-03-23 MED ORDER — HYDROCODONE-ACETAMINOPHEN 10-325 MG PO TABS: 1.0000 | ORAL_TABLET | Freq: Three times a day (TID) | ORAL | 0 refills | Status: DC | PRN

## 2019-03-23 NOTE — Progress Notes (Signed)
Virtual Visit via [email or web address]  This visit type was conducted due to national recommendations for restrictions regarding the COVID-19 pandemic (e.g. social distancing).  This format is felt to be most appropriate for this patient at this time.  All issues noted in this document were discussed and addressed.  No physical exam was performed (except for noted visual exam findings with Video Visits).   I connected with@ on 03/23/19 at  2:30 PM EDT by a video enabled telemedicine application  and verified that I am speaking with the correct person using two identifiers. Location patient: home Location provider: work or home office Persons participating in the virtual visit: patient, provider  I discussed the limitations, risks, security and privacy concerns of performing an evaluation and management service by telephone and the availability of in person appointments. I also discussed with the patient that there may be a patient responsible charge related to this service. The patient expressed understanding and agreed to proceed.  Reason for visit: medication refill  HPI:  63 yr old male with morbid obesity,  Chronic joint and low back pain, hypertension,  Atrial fibrillation and OSA presents for medication refills.   His pain is unchanged,  although he has retired from [employer] work which required standing for long periods of time and has had a total  knee replacement .  He has multiple joints that are the causes of his pain.  He cannot use NSAIDs due to his history of gastric bypass, Refill history confirmed via [place] Controlled Substance database, accessed by me today.Marland KitchenHe is using 3 Vicodin daily for management of his pain . He is getting outside daily to work in the yard,  But notes that his leg muscles have become weakened  since he has been unable to exercise at the gym due to [place] enforced closures.   Morbid obesity:  He states that his weight is unchanged at 380 llbs  OSA:  Historically untreated per  patient preference.  He continues to sleep in a recliner and denies daytime hypersomnia and morning fatigue. He has not had any worsening fluid retention .   Hypertension: patient checks blood pressure twice weekly at home.  Readings have been for the most part < 140/80 at rest . Patient is following a reduce salt diet most days and is taking medications as prescribed The patient has no signs or symptoms of COVID 19 infection (fever, cough, sore throat  or shortness of breath beyond what is typical for patient).  Patient denies contact with other persons with the above mentioned symptoms or with anyone confirmed to have COVID 19    ROS: See pertinent positives and negatives per HPI.  Past Medical History:  Diagnosis Date  . Arthritis    a. chronic joint pain  . B12 deficiency   . Chronic systolic CHF (congestive heart failure) ([place])    a. echo 2013: EF of 50-55%, normal right ventricular systolic pressure, normal left atrium; b. EF 40-45%, inadequate for LV wall motion, mild MR, LA severely dilated @ 52 mm, PASP nl  . History of DVT (deep vein thrombosis) 2004   s/p bariatric surgery  . History of stress test    a. there was no ST segment deviation noted during stress,    There is a small defect of moderate severity present in the apex location, suggestive of apical ischemia, intermediate risk, calculated EF 21% but visually appears to be 35-40%  . Hypertension   . Hypothyroidism   . Neuropathy   .  Obesity   . OSA (obstructive sleep apnea)    a. not compliant with CPAP  . PAF (paroxysmal atrial fibrillation) (HCC)    a. CHADSVASc at least 2 (HTN and vascular disease); b. on Eliquis     Past Surgical History:  Procedure Laterality Date  . ARTHROSCOPIC REPAIR ACL Bilateral 1993   bilateral knee x2 on left and x2 on right  . BARIATRIC SURGERY  2004   [place]   . BARIATRIC SURGERY    . CHOLECYSTECTOMY  1982  . ELECTROPHYSIOLOGIC STUDY N/A 06/20/2015   Procedure: CARDIOVERSION;   Surgeon: Antonieta Ibaimothy J Gollan, MD;  Location: [facility] ORS;  Service: Cardiovascular;  Laterality: N/A;  . KNEE ARTHROPLASTY Right 06/20/2017   Procedure: COMPUTER ASSISTED TOTAL KNEE ARTHROPLASTY;  Surgeon: Donato HeinzHooten, James P, MD;  Location: [facility] ORS;  Service: Orthopedics;  Laterality: Right;  . MENISCUS DEBRIDEMENT Left 2011   Left knee  . REPLACEMENT TOTAL KNEE Left 07/2013  . TONSILLECTOMY      Family History  Problem Relation Age of Onset  . Dementia Mother   . Diabetes Father   . Alzheimer's disease Father     SOCIAL HX:  Social History   Tobacco Use  . Smoking status: Never Smoker  . Smokeless tobacco: Never Used  Substance Use Topics  . Alcohol use: Yes    Alcohol/week: 2.0 standard drinks    Types: 2 Shots of liquor per week    Comment: Vodka and lemonade occasionally 2-3/week   . Drug use: No     Current Outpatient Medications:  .  amiodarone (PACERONE) 200 MG tablet, TAKE 1 TABLET BY MOUTH TWO  TIMES DAILY, Disp: 180 tablet, Rfl: 2 .  B Complex Vitamins (B COMPLEX 50 PO), Take 1 tablet by mouth daily. , Disp: , Rfl:  .  buPROPion (WELLBUTRIN XL) 150 MG 24 hr tablet, Take 3 tablets (450 mg total) by mouth daily., Disp: 270 tablet, Rfl: 3 .  Cholecalciferol (VITAMIN D3) 2000 UNITS TABS, Take 1 tablet by mouth daily. , Disp: , Rfl:  .  cyclobenzaprine (FLEXERIL) 10 MG tablet, Take 1 tablet (10 mg total) by mouth 3 (three) times daily as needed for muscle spasms., Disp: 30 tablet, Rfl: 0 .  dabigatran (PRADAXA) 150 MG CAPS capsule, TAKE 1 CAPSULE BY MOUTH 2 TIMES DAILY, Disp: 180 capsule, Rfl: 3 .  docusate sodium (DULCOLAX) 100 MG capsule, Take 400 mg by mouth daily. , Disp: , Rfl:  .  Ferrous Gluconate 324 (37.5 Fe) MG TABS, Take 1 tablet by mouth daily. , Disp: , Rfl:  .  furosemide (LASIX) 40 MG tablet, Take 1 tablet (40 mg total) by mouth 2 (two) times daily as needed., Disp: 180 tablet, Rfl: 3 .  gabapentin (NEURONTIN) 300 MG capsule, TAKE 1 CAPSULE BY MOUTH TWO TIMES  DAILY, Disp: 180 capsule, Rfl: 2 .  GLUCOSAMINE HCL-MSM PO, Take 1 tablet by mouth daily. , Disp: , Rfl:  .  [START ON 05/22/2019] HYDROcodone-acetaminophen (NORCO) 10-325 MG tablet, Take 1 tablet by mouth every 8 (eight) hours as needed for severe pain., Disp: 90 tablet, Rfl: 0 .  [START ON 04/22/2019] HYDROcodone-acetaminophen (NORCO) 10-325 MG tablet, Take 1 tablet by mouth every 8 (eight) hours as needed., Disp: 90 tablet, Rfl: 0 .  HYDROcodone-acetaminophen (NORCO) 10-325 MG tablet, Take 1 tablet by mouth every 8 (eight) hours as needed for severe pain., Disp: 90 tablet, Rfl: 0 .  levothyroxine (SYNTHROID, LEVOTHROID) 25 MCG tablet, TAKE 1 TABLET BY MOUTH  DAILY BEFORE BREAKFAST, Disp: 90 tablet, Rfl: 1 .  losartan-hydrochlorothiazide (HYZAAR) 100-25 MG tablet, Take 1 tablet by mouth daily., Disp: 90 tablet, Rfl: 3 .  metoprolol succinate (TOPROL-XL) 25 MG 24 hr tablet, TAKE 1 TABLET(25 MG) BY MOUTH DAILY, Disp: 90 tablet, Rfl: 1 .  Multiple Vitamin (MULTIVITAMIN) tablet, Take 1 tablet by mouth daily., Disp: , Rfl:  .  nitroGLYCERIN (NITROSTAT) 0.4 MG SL tablet, Place 1 tablet (0.4 mg total) under the tongue every 5 (five) minutes as needed for chest pain., Disp: 30 tablet, Rfl: 3 .  Potassium Chloride ER 20 MEQ TBCR, Take 20 mEq by mouth 2 (two) times daily as needed., Disp: 180 tablet, Rfl: 3 .  propranolol (INDERAL) 20 MG tablet, Take 20 mg by mouth 3 (three) times daily as needed. Reported on 03/22/2016, Disp: , Rfl:  .  venlafaxine XR (EFFEXOR-XR) 37.5 MG 24 hr capsule, TAKE 1 CAPSULE BY MOUTH  DAILY WITH BREAKFAST, Disp: 90 capsule, Rfl: 1 .  vitamin B-12 (CYANOCOBALAMIN) 500 MCG tablet, Take 500 mcg by mouth daily., Disp: , Rfl:  .  vitamin E 400 UNIT capsule, Take 400 Units by mouth daily., Disp: , Rfl:   EXAM:  VITALS per patient if applicable:  GENERAL: alert, oriented, appears well and in no acute distress  HEENT: atraumatic, conjunttiva clear, no obvious abnormalities on  inspection of external nose and ears  NECK: normal movements of the head and neck  LUNGS: on inspection no signs of respiratory distress, breathing rate appears normal, no obvious gross SOB, gasping or wheezing  CV: no obvious cyanosis  MS: moves all visible extremities without noticeable abnormality  PSYCH/NEURO: pleasant and cooperative, no obvious depression or anxiety, speech and thought processing grossly intact  ASSESSMENT AND PLAN:  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 Marland Kitchen.  His Refill history was confirmed via [place] Controlled Substance database by me today.   I have refilled his hydrocodone for  Use not more than 3 times daily for June July and August   and advised hiim to maximize his tylenol use to 2000 mg daily. .  NSAIDs are contraindicated due to history of gastric bypass   Essential hypertension he reports compliance with medication regimen  AND IMPROVEMENT by hone readings to 130/80 on  Maximal dose losartan hct and low dose metoprolol .    Renal function, electrolytes and screen for proteinuria are due next month .  Lab Results  Component Value Date   CREATININE 1.13 09/28/2018   No results found for: Concepcion ElkMICROALBUR, [id number] Lab Results  Component Value Date   NA 144 09/28/2018   K 4.8 09/28/2018   CL 103 09/28/2018   CO2 23 09/28/2018     Major depressive disorder with current active episode Aggravated by winter season, improved with increase in  wellbutrin dose to 450 mg daily and regular participation in outdoor activities     I discussed the assessment and treatment plan with the patient. The patient was provided an  opportunity to ask questions and all were answered. The patient agreed with the plan and demonstrated an understanding of the instructions.  The patient was advised to call back or seek an in-person evaluation if the symptoms worsen or if the condition fails to improve as anticipated.  I provided 25 minutes of non-face-to-face time during this encounter.   Crecencio Mc, MD

## 2019-03-25 NOTE — Assessment & Plan Note (Signed)
he reports compliance with medication regimen  AND IMPROVEMENT by hone readings to 130/80 on  Maximal dose losartan hct and low dose metoprolol .    Renal function, electrolytes and screen for proteinuria are due next month .  Lab Results  Component Value Date   CREATININE 1.13 09/28/2018   No results found for: Derl Barrow Lab Results  Component Value Date   NA 144 09/28/2018   K 4.8 09/28/2018   CL 103 09/28/2018   CO2 23 09/28/2018

## 2019-03-25 NOTE — Assessment & Plan Note (Signed)
He is s/p right knee replacement Sept 2018 and he has now retired from his [place] job during which he stood for 8 hours daily. 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 Marland Kitchen  His Refill history was confirmed via [place] Controlled Substance database by me today.   I have refilled his hydrocodone for  Use not more than 3 times daily for June July and August   and advised hiim to maximize his tylenol use to 2000 mg daily. .  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

## 2019-03-25 NOTE — Assessment & Plan Note (Signed)
Aggravated by winter season, improved with increase in  wellbutrin dose to 450 mg daily and regular participation in outdoor activities  

## 2019-04-05 ENCOUNTER — Other Ambulatory Visit: Payer: Self-pay | Admitting: Internal Medicine

## 2019-04-17 ENCOUNTER — Other Ambulatory Visit: Payer: Self-pay

## 2019-04-17 MED ORDER — METOPROLOL SUCCINATE ER 25 MG PO TB24: ORAL_TABLET | ORAL | 1 refills | Status: DC

## 2019-05-24 ENCOUNTER — Other Ambulatory Visit: Payer: Self-pay | Admitting: Internal Medicine

## 2019-06-29 ENCOUNTER — Other Ambulatory Visit: Payer: Self-pay

## 2019-06-29 ENCOUNTER — Ambulatory Visit (INDEPENDENT_AMBULATORY_CARE_PROVIDER_SITE_OTHER): Payer: BC Managed Care – PPO | Admitting: Internal Medicine

## 2019-06-29 ENCOUNTER — Encounter: Payer: Self-pay | Admitting: Internal Medicine

## 2019-06-29 DIAGNOSIS — M159 Polyosteoarthritis, unspecified: Secondary | ICD-10-CM

## 2019-06-29 DIAGNOSIS — E039 Hypothyroidism, unspecified: Secondary | ICD-10-CM | POA: Diagnosis not present

## 2019-06-29 DIAGNOSIS — E02 Subclinical iodine-deficiency hypothyroidism: Secondary | ICD-10-CM

## 2019-06-29 DIAGNOSIS — R296 Repeated falls: Secondary | ICD-10-CM

## 2019-06-29 MED ORDER — HYDROCODONE-ACETAMINOPHEN 10-325 MG PO TABS: 1.0000 | ORAL_TABLET | Freq: Three times a day (TID) | ORAL | 0 refills | Status: DC | PRN

## 2019-06-29 NOTE — Progress Notes (Signed)
Virtual Visit converted to telephone  Note  This visit type was conducted due to national recommendations for restrictions regarding the COVID-19 pandemic (e.g. social distancing).  This format is felt to be most appropriate for this patient at this time.  All issues noted in this document were discussed and addressed.  No physical exam was performed (except for noted visual exam findings with Video Visits).   I attempted to connect  with@ on 06/29/19 at  3:30 PM EDT by a video enabled telemedicine application.  Interactive audio and video telecommunications were initially established beteen this provider and patient, however ultimately failed, due to patient having technical difficulties. We continued and completed visit with audio only  and verified that I am speaking with the correct person using two identifiers  Location patient: seated in his parked car Location provider: work or home office Persons participating in the virtual visit: patient, provider  I discussed the limitations, risks, security and privacy concerns of performing an evaluation and management service by telephone and the availability of in person appointments. I also discussed with the patient that there may be a patient responsible charge related to this service. The patient expressed understanding and agreed to proceed.  Reason for visit: med refill   HPI:  63 yr old male with morbid obesity   Chronic pain secondary to bilateral knee pain , shoulder pain and low back pain presents for follow up and medication refill.    His pain is unchanged,  although he has retired from [employer] work which required standing for long periods of time and has had a total  knee replacement .  He has multiple joints that are the causes of his pain.  He cannot use NSAIDs due to his history of gastric bypass, Refill history confirmed via [place] Controlled Substance database, accessed by me today.Marland Kitchen.He is using 3 Vicodin daily for management of his pain  . He is getting outside daily to work in the yard,  But notes that his leg muscles have become weakened  since he has been unable to exercise at the gym due to COVID enforced closures.  He has had 6 falls since his last visit, all occurring while doing yard work . He denies dizziness ,  Loss of vision, states that all falls occurred from the uneven ground and unseen holes in his  yard caused by his dog's digging .  He has had no injuries from the fall "because I learned  in high school how to fall."   ROS: See pertinent positives and negatives per HPI.  Past Medical History:  Diagnosis Date  . Arthritis    a. chronic joint pain  . B12 deficiency   . Chronic systolic CHF (congestive heart failure) (HCC)    a. echo 2013: EF of 50-55%, normal right ventricular systolic pressure, normal left atrium; b. EF 40-45%, inadequate for LV wall motion, mild MR, LA severely dilated @ 52 mm, PASP nl  . History of DVT (deep vein thrombosis) 2004   s/p bariatric surgery  . History of stress test    a. there was no ST segment deviation noted during stress,    There is a small defect of moderate severity present in the apex location, suggestive of apical ischemia, intermediate risk, calculated EF 21% but visually appears to be 35-40%  . Hypertension   . Hypothyroidism   . Neuropathy   . Obesity   . OSA (obstructive sleep apnea)    a. not compliant with CPAP  .  PAF (paroxysmal atrial fibrillation) ([place])    a. CHADSVASc at least 2 (HTN and vascular disease); b. on Eliquis     Past Surgical History:  Procedure Laterality Date  . ARTHROSCOPIC REPAIR ACL Bilateral 1993   bilateral knee x2 on left and x2 on right  . BARIATRIC SURGERY  2004   Rou en Y   . BARIATRIC SURGERY    . CHOLECYSTECTOMY  1982  . ELECTROPHYSIOLOGIC STUDY N/A 06/20/2015   Procedure: CARDIOVERSION;  Surgeon: Minna Merritts, MD;  Location: [facility] ORS;  Service: Cardiovascular;  Laterality: N/A;  . KNEE ARTHROPLASTY Right 06/20/2017    Procedure: COMPUTER ASSISTED TOTAL KNEE ARTHROPLASTY;  Surgeon: Dereck Leep, MD;  Location: [facility] ORS;  Service: Orthopedics;  Laterality: Right;  . MENISCUS DEBRIDEMENT Left 2011   Left knee  . REPLACEMENT TOTAL KNEE Left 07/2013  . TONSILLECTOMY      Family History  Problem Relation Age of Onset  . Dementia Mother   . Diabetes Father   . Alzheimer's disease Father     SOCIAL HX:  reports that he has never smoked. He has never used smokeless tobacco. He reports current alcohol use of about 2.0 standard drinks of alcohol per week. He reports that he does not use drugs.   Current Outpatient Medications:  .  amiodarone (PACERONE) 200 MG tablet, TAKE 1 TABLET BY MOUTH TWO  TIMES DAILY, Disp: 180 tablet, Rfl: 2 .  B Complex Vitamins (B COMPLEX 50 PO), Take 1 tablet by mouth daily. , Disp: , Rfl:  .  buPROPion (WELLBUTRIN XL) 150 MG 24 hr tablet, Take 3 tablets (450 mg total) by mouth daily., Disp: 270 tablet, Rfl: 3 .  Cholecalciferol (VITAMIN D3) 2000 UNITS TABS, Take 1 tablet by mouth daily. , Disp: , Rfl:  .  dabigatran (PRADAXA) 150 MG CAPS capsule, TAKE 1 CAPSULE BY MOUTH 2 TIMES DAILY, Disp: 180 capsule, Rfl: 3 .  docusate sodium (DULCOLAX) 100 MG capsule, Take 400 mg by mouth daily. , Disp: , Rfl:  .  Ferrous Gluconate 324 (37.5 Fe) MG TABS, Take 1 tablet by mouth daily. , Disp: , Rfl:  .  gabapentin (NEURONTIN) 300 MG capsule, TAKE 1 CAPSULE BY MOUTH TWO TIMES DAILY, Disp: 180 capsule, Rfl: 2 .  GLUCOSAMINE HCL-MSM PO, Take 1 tablet by mouth daily. , Disp: , Rfl:  .  [START ON 08/28/2019] HYDROcodone-acetaminophen (NORCO) 10-325 MG tablet, Take 1 tablet by mouth every 8 (eight) hours as needed for severe pain., Disp: 90 tablet, Rfl: 0 .  [START ON 07/29/2019] HYDROcodone-acetaminophen (NORCO) 10-325 MG tablet, Take 1 tablet by mouth every 8 (eight) hours as needed., Disp: 90 tablet, Rfl: 0 .  HYDROcodone-acetaminophen (NORCO) 10-325 MG tablet, Take 1 tablet by mouth every 8  (eight) hours as needed for severe pain., Disp: 90 tablet, Rfl: 0 .  levothyroxine (SYNTHROID) 25 MCG tablet, TAKE 1 TABLET BY MOUTH  DAILY BEFORE BREAKFAST, Disp: 90 tablet, Rfl: 1 .  losartan-hydrochlorothiazide (HYZAAR) 100-25 MG tablet, Take 1 tablet by mouth daily., Disp: 90 tablet, Rfl: 3 .  metoprolol succinate (TOPROL-XL) 25 MG 24 hr tablet, TAKE 1 TABLET(25 MG) BY MOUTH DAILY, Disp: 90 tablet, Rfl: 1 .  Multiple Vitamin (MULTIVITAMIN) tablet, Take 1 tablet by mouth daily., Disp: , Rfl:  .  nitroGLYCERIN (NITROSTAT) 0.4 MG SL tablet, Place 1 tablet (0.4 mg total) under the tongue every 5 (five) minutes as needed for chest pain., Disp: 30 tablet, Rfl: 3 .  venlafaxine XR (EFFEXOR-XR) 37.5  MG 24 hr capsule, TAKE 1 CAPSULE BY MOUTH  DAILY WITH BREAKFAST, Disp: 90 capsule, Rfl: 1 .  vitamin B-12 (CYANOCOBALAMIN) 500 MCG tablet, Take 500 mcg by mouth daily., Disp: , Rfl:  .  vitamin E 400 UNIT capsule, Take 400 Units by mouth daily., Disp: , Rfl:  .  cyclobenzaprine (FLEXERIL) 10 MG tablet, Take 1 tablet (10 mg total) by mouth 3 (three) times daily as needed for muscle spasms. (Patient not taking: Reported on 06/29/2019), Disp: 30 tablet, Rfl: 0 .  furosemide (LASIX) 40 MG tablet, Take 1 tablet (40 mg total) by mouth 2 (two) times daily as needed. (Patient not taking: Reported on 06/29/2019), Disp: 180 tablet, Rfl: 3 .  Potassium Chloride ER 20 MEQ TBCR, Take 20 mEq by mouth 2 (two) times daily as needed. (Patient not taking: Reported on 06/29/2019), Disp: 180 tablet, Rfl: 3 .  propranolol (INDERAL) 20 MG tablet, Take 20 mg by mouth 3 (three) times daily as needed. Reported on 03/22/2016, Disp: , Rfl:   EXAM:  VITALS per patient if applicable:  GENERAL: alert, oriented, appears well and in no acute distress  HEENT: atraumatic, conjunttiva clear, no obvious abnormalities on inspection of external nose and ears  NECK: normal movements of the head and neck  LUNGS: on inspection no signs of  respiratory distress, breathing rate appears normal, no obvious gross SOB, gasping or wheezing  CV: no obvious cyanosis  MS: moves all visible extremities without noticeable abnormality  PSYCH/NEURO: pleasant and cooperative, no obvious depression or anxiety, speech and thought processing grossly intact  ASSESSMENT AND PLAN:  Discussed the following assessment and plan:  Acquired hypothyroidism - Plan: TSH  Subclinical iodine-deficiency hypothyroidism  Severe obesity (BMI >= 40) (HCC)  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 Encouraged to begin exercising now that he has retired and focus on losing weight.   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 Marland Kitchen  His Refill history was confirmed via [place] Controlled Substance database by me today.   I have refilled his hydrocodone for  Use not more than 3 times daily for Sept, Oct and November  and advised him to maximize his tylenol use to 2000 mg daily. .  NSAIDs are contraindicated due to history of gastric bypass   Frequent falls Recommended PT evaluation for work in strengthening proximal muscle strength but he declines at this time.     I discussed the assessment and treatment plan with the patient. The patient was provided an opportunity to ask questions and all were answered. The patient agreed with the plan and demonstrated an understanding of the instructions.   The patient was advised to call back or seek an in-person evaluation if the symptoms worsen or if  the condition fails to improve as anticipated.   I provided  15 minutes of non-face-to-face time during this encounter reviewing patient's current problems and past surgeries.  Providing counseling on the above mentioned problems , and coordination  of care .  Crecencio Mc, MD

## 2019-07-01 DIAGNOSIS — R296 Repeated falls: Secondary | ICD-10-CM | POA: Insufficient documentation

## 2019-07-01 NOTE — Assessment & Plan Note (Signed)
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 .  His Refill history was confirmed via [place] Controlled Substance database by me today.   I have refilled his hydrocodone for  Use not more than 3 times daily for Sept, Oct and November  and advised him to maximize his tylenol use to 2000 mg daily. .  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  

## 2019-07-01 NOTE — Assessment & Plan Note (Signed)
Recommended PT evaluation for work in strengthening proximal muscle strength but he declines at this time.

## 2019-07-01 NOTE — Assessment & Plan Note (Signed)
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 Encouraged to begin exercising now that he has retired and focus on losing weight.

## 2019-07-09 ENCOUNTER — Other Ambulatory Visit: Payer: Self-pay | Admitting: *Deleted

## 2019-07-09 DIAGNOSIS — I10 Essential (primary) hypertension: Secondary | ICD-10-CM

## 2019-07-09 MED ORDER — LOSARTAN POTASSIUM-HCTZ 100-25 MG PO TABS: 1.0000 | ORAL_TABLET | Freq: Every day | ORAL | 0 refills | Status: DC

## 2019-07-24 ENCOUNTER — Other Ambulatory Visit: Payer: Self-pay | Admitting: Internal Medicine

## 2019-08-24 ENCOUNTER — Ambulatory Visit: Payer: BC Managed Care – PPO | Admitting: Family Medicine

## 2019-09-03 NOTE — Progress Notes (Signed)
[name] [address] [place], [place] [address] Phone: [phone] Subjective:   Johnathan [name], am serving as a scribe for Dr. Antoine Primas.  This visit occurred during the SARS-CoV-2 public health emergency.  Safety protocols were in place, including screening questions prior to the visit, additional usage of staff PPE, and extensive cleaning of exam room while observing appropriate contact time as indicated for disinfecting solutions.     CC: Hand and shoulder pain  [id number]   09/2016 Discussed with patient again at great length. Patient knows he will likely need a knee replacement at some point. Patient declined viscous supplementation today. We discussed icing regimen and home exercises. We discussed that the stability brace should be worn with any activity outside regular daily activities. Patient and will follow-up with me again on an as-needed basis unless anything changes.  Knee arthroscopy 2018 Update 09/04/2019 Johnathan [name] is a 63 y.o. male coming in with complaint of back pain and osteoarthritis. Is here for a refill on gabapentin. Has pain in entire body including all muscles especially at night. Is using gabapentin 200mg  TID. Patient has been falling due to lack of muscle tone in his lower legs. Also has neuropathy in his feet.     Past Medical History:  Diagnosis Date  . Arthritis    a. chronic joint pain  . B12 deficiency   . Chronic systolic CHF (congestive heart failure) (HCC)    a. echo 2013: EF of 50-55%, normal right ventricular systolic pressure, normal left atrium; b. EF 40-45%, inadequate for LV wall motion, mild MR, LA severely dilated @ 52 mm, PASP nl  . History of DVT (deep vein thrombosis) 2004   s/p bariatric surgery  . History of stress test    a. there was no ST segment deviation noted during stress,    There is a small defect of moderate severity present in the apex location, suggestive of apical  ischemia, intermediate risk, calculated EF 21% but visually appears to be 35-40%  . Hypertension   . Hypothyroidism   . Neuropathy   . Obesity   . OSA (obstructive sleep apnea)    a. not compliant with CPAP  . PAF (paroxysmal atrial fibrillation) (HCC)    a. CHADSVASc at least 2 (HTN and vascular disease); b. on Eliquis    Past Surgical History:  Procedure Laterality Date  . ARTHROSCOPIC REPAIR ACL Bilateral 1993   bilateral knee x2 on left and x2 on right  . BARIATRIC SURGERY  2004   Rou en Y   . BARIATRIC SURGERY    . CHOLECYSTECTOMY  1982  . ELECTROPHYSIOLOGIC STUDY N/A 06/20/2015   Procedure: CARDIOVERSION;  Surgeon: 08/20/2015, MD;  Location: [facility] ORS;  Service: Cardiovascular;  Laterality: N/A;  . KNEE ARTHROPLASTY Right 06/20/2017   Procedure: COMPUTER ASSISTED TOTAL KNEE ARTHROPLASTY;  Surgeon: 08/20/2017, MD;  Location: [facility] ORS;  Service: Orthopedics;  Laterality: Right;  . MENISCUS DEBRIDEMENT Left 2011   Left knee  . REPLACEMENT TOTAL KNEE Left 07/2013  . TONSILLECTOMY     Social History   Socioeconomic History  . Marital status: Married    Spouse name: Not on file  . Number of children: Not on file  . Years of education: Not on file  . Highest education level: Not on file  Occupational History  . Not on file  Social Needs  . Financial resource strain: Not on file  . Food insecurity  Worry: Not on file    Inability: Not on file  . Transportation needs    Medical: Not on file    Non-medical: Not on file  Tobacco Use  . Smoking status: Never Smoker  . Smokeless tobacco: Never Used  Substance and Sexual Activity  . Alcohol use: Yes    Alcohol/week: 2.0 standard drinks    Types: 2 Shots of liquor per week    Comment: Vodka and lemonade occasionally 2-3/week   . Drug use: No  . Sexual activity: Yes    Partners: Female    Comment: Wife  Lifestyle  . Physical activity    Days per week: Not on file    Minutes per session: Not on file  .  Stress: Not on file  Relationships  . Social Herbalist on phone: Not on file    Gets together: Not on file    Attends religious service: Not on file    Active member of club or organization: Not on file    Attends meetings of clubs or organizations: Not on file    Relationship status: Not on file  Other Topics Concern  . Not on file  Social History Narrative   Currently between jobs   Lives with Wife   1 Son (33) and 1 daughter (45)    2 grandchildren by daughter    1 dog and 1 cat    Enjoys gardening, movies, dining out.    No Known Allergies Family History  Problem Relation Age of Onset  . Dementia Mother   . Diabetes Father   . Alzheimer's disease Father     Current Outpatient Medications (Endocrine & Metabolic):  .  levothyroxine (SYNTHROID) 25 MCG tablet, TAKE 1 TABLET BY MOUTH  DAILY BEFORE BREAKFAST  Current Outpatient Medications (Cardiovascular):  .  amiodarone (PACERONE) 200 MG tablet, TAKE 1 TABLET BY MOUTH TWO  TIMES DAILY .  furosemide (LASIX) 40 MG tablet, Take 1 tablet (40 mg total) by mouth 2 (two) times daily as needed. Marland Kitchen  losartan-hydrochlorothiazide (HYZAAR) 100-25 MG tablet, Take 1 tablet by mouth daily. .  metoprolol succinate (TOPROL-XL) 25 MG 24 hr tablet, TAKE 1 TABLET(25 MG) BY MOUTH DAILY .  nitroGLYCERIN (NITROSTAT) 0.4 MG SL tablet, Place 1 tablet (0.4 mg total) under the tongue every 5 (five) minutes as needed for chest pain. Marland Kitchen  propranolol (INDERAL) 20 MG tablet, Take 20 mg by mouth 3 (three) times daily as needed. Reported on 03/22/2016   Current Outpatient Medications (Analgesics):  .  HYDROcodone-acetaminophen (NORCO) 10-325 MG tablet, Take 1 tablet by mouth every 8 (eight) hours as needed for severe pain. Marland Kitchen  HYDROcodone-acetaminophen (NORCO) 10-325 MG tablet, Take 1 tablet by mouth every 8 (eight) hours as needed. Marland Kitchen  HYDROcodone-acetaminophen (NORCO) 10-325 MG tablet, Take 1 tablet by mouth every 8 (eight) hours as needed for  severe pain.  Current Outpatient Medications (Hematological):  .  dabigatran (PRADAXA) 150 MG CAPS capsule, TAKE 1 CAPSULE BY MOUTH 2 TIMES DAILY .  Ferrous Gluconate 324 (37.5 Fe) MG TABS, Take 1 tablet by mouth daily.  .  vitamin B-12 (CYANOCOBALAMIN) 500 MCG tablet, Take 500 mcg by mouth daily.  Current Outpatient Medications (Other):  Marland Kitchen  B Complex Vitamins (B COMPLEX 50 PO), Take 1 tablet by mouth daily.  Marland Kitchen  buPROPion (WELLBUTRIN XL) 150 MG 24 hr tablet, TAKE 3 TABLETS BY MOUTH  DAILY .  Cholecalciferol (VITAMIN D3) 2000 UNITS TABS, Take 1 tablet by mouth daily.  Marland Kitchen  cyclobenzaprine (FLEXERIL) 10 MG tablet, Take 1 tablet (10 mg total) by mouth 3 (three) times daily as needed for muscle spasms. Marland Kitchen.  docusate sodium (DULCOLAX) 100 MG capsule, Take 400 mg by mouth daily.  Marland Kitchen.  gabapentin (NEURONTIN) 300 MG capsule, TAKE 1 CAPSULE BY MOUTH TWO TIMES DAILY .  GLUCOSAMINE HCL-MSM PO, Take 1 tablet by mouth daily.  .  Multiple Vitamin (MULTIVITAMIN) tablet, Take 1 tablet by mouth daily. .  Potassium Chloride ER 20 MEQ TBCR, Take 20 mEq by mouth 2 (two) times daily as needed. .  venlafaxine XR (EFFEXOR-XR) 37.5 MG 24 hr capsule, TAKE 1 CAPSULE BY MOUTH  DAILY WITH BREAKFAST .  vitamin E 400 UNIT capsule, Take 400 Units by mouth daily. Marland Kitchen.  gabapentin (NEURONTIN) 300 MG capsule, Take 1 capsule (300 mg total) by mouth 3 (three) times daily.    Past medical history, social, surgical and family history all reviewed in electronic medical record.  No pertanent information unless stated regarding to the chief complaint.   Review of Systems:  No headache, visual changes, nausea, vomiting, diarrhea, constipation, dizziness, abdominal pain, skin rash, fevers, chills, night sweats, weight loss, swollen lymph nodes, body aches, joint swelling, , chest pain, shortness of breath, mood changes.  Positive muscle aches  Objective  Blood pressure 122/82, pulse (!) 58, height 6\' 3"  (1.905 m), weight (!) 395 lb (179.2  kg), SpO2 97 %.    General: No apparent distress alert and oriented x3 mood and affect normal, dressed appropriately.  HEENT: Pupils equal, extraocular movements intact  Respiratory: Patient's speak in full sentences and does not appear short of breath  Cardiovascular: Trace lower extremity edema, non tender, no erythema  Skin: Warm dry intact with no signs of infection or rash on extremities or on axial skeleton.  Abdomen: Soft morbidly obese Neuro: Cranial nerves II through XII are intact, peripheral neuropathy noted in the lower extremities bilaterally to ankle height Lymph: No lymphadenopathy of posterior or anterior cervical chain or axillae bilaterally.  Gait severely antalgic walking with the aid of a cane MSK:  tender with mild limited range of motion and good stability and symmetric strength and tone of  elbows, wrist, knee replacement noted.  Patient does have some weakness with 4 out of 5 strength With ambulation patient does have some very mild left-sided foot drop noted.    Impression and Recommendations:     This case required medical decision making of moderate complexity. The above documentation has been reviewed and is accurate and complete Judi SaaZachary M Yasmen Cortner, DO       Note: This dictation was prepared with Dragon dictation along with smaller phrase technology. Any transcriptional errors that result from this process are unintentional.

## 2019-09-04 ENCOUNTER — Other Ambulatory Visit: Payer: Self-pay

## 2019-09-04 ENCOUNTER — Other Ambulatory Visit (INDEPENDENT_AMBULATORY_CARE_PROVIDER_SITE_OTHER): Payer: BC Managed Care – PPO

## 2019-09-04 ENCOUNTER — Encounter: Payer: Self-pay | Admitting: Family Medicine

## 2019-09-04 ENCOUNTER — Ambulatory Visit: Payer: BC Managed Care – PPO | Admitting: Family Medicine

## 2019-09-04 VITALS — BP 122/82 | HR 58 | Ht 75.0 in | Wt 395.0 lb

## 2019-09-04 DIAGNOSIS — R296 Repeated falls: Secondary | ICD-10-CM | POA: Diagnosis not present

## 2019-09-04 DIAGNOSIS — M2550 Pain in unspecified joint: Secondary | ICD-10-CM

## 2019-09-04 DIAGNOSIS — M130 Polyarthritis, unspecified: Secondary | ICD-10-CM

## 2019-09-04 LAB — COMPREHENSIVE METABOLIC PANEL
ALT: 22 U/L (ref 0–53)
AST: 22 U/L (ref 0–37)
Albumin: 4 g/dL (ref 3.5–5.2)
Alkaline Phosphatase: 94 U/L (ref 39–117)
BUN: 17 mg/dL (ref 6–23)
CO2: 27 mEq/L (ref 19–32)
Calcium: 8.8 mg/dL (ref 8.4–10.5)
Chloride: 101 mEq/L (ref 96–112)
Creatinine, Ser: 0.94 mg/dL (ref 0.40–1.50)
GFR: 80.77 mL/min (ref >60.00)
Glucose, Bld: 89 mg/dL (ref 70–99)
Potassium: 4.2 mEq/L (ref 3.5–5.1)
Sodium: 137 mEq/L (ref 135–145)
Total Bilirubin: 0.5 mg/dL (ref 0.2–1.2)
Total Protein: 7.4 g/dL (ref 6.0–8.3)

## 2019-09-04 LAB — CBC WITH DIFFERENTIAL/PLATELET
Basophils Absolute: 0.1 10*3/uL (ref 0.0–0.1)
Basophils Relative: 0.8 % (ref 0.0–3.0)
Eosinophils Absolute: 0.2 10*3/uL (ref 0.0–0.7)
Eosinophils Relative: 3.6 % (ref 0.0–5.0)
HCT: 42.6 % (ref 39.0–52.0)
Hemoglobin: 14.2 g/dL (ref 13.0–17.0)
Lymphocytes Relative: 26.9 % (ref 12.0–46.0)
Lymphs Abs: 1.8 10*3/uL (ref 0.7–4.0)
MCHC: 33.3 g/dL (ref 30.0–36.0)
MCV: 92.1 fl (ref 78.0–100.0)
Monocytes Absolute: 0.7 10*3/uL (ref 0.1–1.0)
Monocytes Relative: 9.6 % (ref 3.0–12.0)
Neutro Abs: 4 10*3/uL (ref 1.4–7.7)
Neutrophils Relative %: 59.1 % (ref 43.0–77.0)
Platelets: 165 10*3/uL (ref 150.0–400.0)
RBC: 4.63 Mil/uL (ref 4.22–5.81)
RDW: 14.4 % (ref 11.5–15.5)
WBC: 6.8 10*3/uL (ref 4.0–10.5)

## 2019-09-04 LAB — IBC PANEL
Iron: 96 ug/dL (ref 42–165)
Saturation Ratios: 27.3 % (ref 20.0–50.0)
Transferrin: 251 mg/dL (ref 212.0–360.0)

## 2019-09-04 LAB — SEDIMENTATION RATE: Sed Rate: 17 mm/hr (ref 0–20)

## 2019-09-04 LAB — VITAMIN D 25 HYDROXY (VIT D DEFICIENCY, FRACTURES): VITD: 30.81 ng/mL (ref 30.00–100.00)

## 2019-09-04 LAB — VITAMIN B12: Vitamin B-12: 1150 pg/mL — ABNORMAL HIGH (ref 211–911)

## 2019-09-04 LAB — TSH: TSH: 3.56 u[IU]/mL (ref 0.35–4.50)

## 2019-09-04 LAB — FERRITIN: Ferritin: 91.7 ng/mL (ref 22.0–322.0)

## 2019-09-04 LAB — URIC ACID: Uric Acid, Serum: 5.2 mg/dL (ref 4.0–7.8)

## 2019-09-04 LAB — C-REACTIVE PROTEIN: CRP: <1 mg/dL (ref 0.5–20.0)

## 2019-09-04 LAB — TESTOSTERONE: Testosterone: 240.14 ng/dL — ABNORMAL LOW (ref 300.00–890.00)

## 2019-09-04 MED ORDER — GABAPENTIN 300 MG PO CAPS: 300.0000 mg | ORAL_CAPSULE | Freq: Three times a day (TID) | ORAL | 0 refills | Status: DC

## 2019-09-04 NOTE — Assessment & Plan Note (Signed)
Continues to have more of a polyarthritis with significant amount of discomfort and pain.  Laboratory work-up ordered today again.  Likely diagnosis is fibromyalgia but wants to make sure patient is doing relatively well overall.  Discussed which activities of doing which was to avoid.  Follow-up with me again in 4 to 8 weeks

## 2019-09-04 NOTE — Patient Instructions (Addendum)
Gabapentin 300 mg 3x a day Labs downstairs Heel lift in shoe at the toe See me in 8 weeks

## 2019-09-04 NOTE — Assessment & Plan Note (Signed)
Walking with the aid of a cane, seems to have some foot drop intermittently.  B12 deficiency could be contributing, multiple different comorbidities.  Laboratory work-up ordered today for further evaluation.  Increase gabapentin to 300 mg 3 times a day

## 2019-09-05 LAB — ANGIOTENSIN CONVERTING ENZYME: Angiotensin-Converting Enzyme: 16 U/L (ref 9–67)

## 2019-09-05 LAB — PTH, INTACT AND CALCIUM
Calcium: 8.9 mg/dL (ref 8.6–10.3)
PTH: 27 pg/mL (ref 14–64)

## 2019-09-05 LAB — CYCLIC CITRUL PEPTIDE ANTIBODY, IGG: Cyclic Citrullin Peptide Ab: <16 UNITS

## 2019-09-05 LAB — CALCIUM, IONIZED: Calcium, Ion: 4.76 mg/dL — ABNORMAL LOW (ref 4.8–5.6)

## 2019-09-05 LAB — ANA: Anti Nuclear Antibody (ANA): NEGATIVE

## 2019-09-18 ENCOUNTER — Other Ambulatory Visit: Payer: Self-pay

## 2019-09-18 ENCOUNTER — Encounter: Payer: Self-pay | Admitting: Internal Medicine

## 2019-09-18 ENCOUNTER — Ambulatory Visit (INDEPENDENT_AMBULATORY_CARE_PROVIDER_SITE_OTHER): Payer: BC Managed Care – PPO | Admitting: Internal Medicine

## 2019-09-18 DIAGNOSIS — E02 Subclinical iodine-deficiency hypothyroidism: Secondary | ICD-10-CM

## 2019-09-18 DIAGNOSIS — M130 Polyarthritis, unspecified: Secondary | ICD-10-CM | POA: Diagnosis not present

## 2019-09-18 DIAGNOSIS — G629 Polyneuropathy, unspecified: Secondary | ICD-10-CM

## 2019-09-18 MED ORDER — LEVOTHYROXINE SODIUM 25 MCG PO TABS: ORAL_TABLET | ORAL | 3 refills | Status: DC

## 2019-09-18 NOTE — Assessment & Plan Note (Addendum)
recent blood tests were normal ,  Which suggests that the cause of his joint pain is unlikely to be an autoimmune or inflammatory arthritis . 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 Marland Kitchen  His Refill history was confirmed via [place] Controlled Substance database by me today.   I have refilled his hydrocodone for  Use not more than 3 times daily for Dec, January and February   and advised him to maximize his tylenol use to 2000 mg daily. .  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

## 2019-09-18 NOTE — Assessment & Plan Note (Signed)
Small fiber.  Improved with gabapentin three times daily

## 2019-09-18 NOTE — Progress Notes (Signed)
Virtual Visit CONVERTED TO TELEPHONE visit   This visit type was conducted due to national recommendations for restrictions regarding the COVID-19 pandemic (e.g. social distancing).  This format is felt to be most appropriate for this patient at this time.  All issues noted in this document were discussed and addressed.  No physical exam was performed (except for noted visual exam findings with Video Visits).   I attempted to connect with@ on 09/18/19 at  3:00 PM EST by a video enabled telemedicine application. Interactive audio and video telecommunications were initially established beteen this provider and patient, however ultimately failed, due to patient having technical difficulties. We continued and completed visit with audio only  and verified that I am speaking with the correct person using two identifiers Location patient: home Location provider: work or home office Persons participating in the virtual visit: patient, provider  I discussed the limitations, risks, security and privacy concerns of performing an evaluation and management service by telephone and the availability of in person appointments. I also discussed with the patient that there may be a patient responsible charge related to this service. The patient expressed understanding and agreed to proceed.  Reason for visit: medication refill   HPI:  63 yr old male with chronic pain,  Neuropathy,  Morbid obesity,  Recurrent mechanical FALLS PRESENTS FOR MED REFILL  RECENTLY seen by DR Katrinka Blazing  For polyarthritis .   WORK UP NEGATIVE  For ifnlammator arthritis . Pain has improved with 3rd dose of gabapentin   4 falls in the last several months.   All occurred while walking in yard on uneven groud.  No orthostasis or dizziness   Hypertension: patient checks blood pressure twice weekly at home.  Readings have been for the most part > 140/80 at rest . Patient is following a reduce salt diet most days and is taking medications as  prescribed  today's reading. 147/79   PULSE  63   ROS: See pertinent positives and negatives per HPI.  Past Medical History:  Diagnosis Date  . Arthritis    a. chronic joint pain  . B12 deficiency   . Chronic systolic CHF (congestive heart failure) (HCC)    a. echo 2013: EF of 50-55%, normal right ventricular systolic pressure, normal left atrium; b. EF 40-45%, inadequate for LV wall motion, mild MR, LA severely dilated @ 52 mm, PASP nl  . History of DVT (deep vein thrombosis) 2004   s/p bariatric surgery  . History of stress test    a. there was no ST segment deviation noted during stress,    There is a small defect of moderate severity present in the apex location, suggestive of apical ischemia, intermediate risk, calculated EF 21% but visually appears to be 35-40%  . Hypertension   . Hypothyroidism   . Neuropathy   . Obesity   . OSA (obstructive sleep apnea)    a. not compliant with CPAP  . PAF (paroxysmal atrial fibrillation) (HCC)    a. CHADSVASc at least 2 (HTN and vascular disease); b. on Eliquis     Past Surgical History:  Procedure Laterality Date  . ARTHROSCOPIC REPAIR ACL Bilateral 1993   bilateral knee x2 on left and x2 on right  . BARIATRIC SURGERY  2004   Rou en Y   . BARIATRIC SURGERY    . CHOLECYSTECTOMY  1982  . ELECTROPHYSIOLOGIC STUDY N/A 06/20/2015   Procedure: CARDIOVERSION;  Surgeon: Antonieta Iba, MD;  Location: [facility] ORS;  Service: Cardiovascular;  Laterality: N/A;  . KNEE ARTHROPLASTY Right 06/20/2017   Procedure: COMPUTER ASSISTED TOTAL KNEE ARTHROPLASTY;  Surgeon: Donato HeinzHooten, James P, MD;  Location: [facility] ORS;  Service: Orthopedics;  Laterality: Right;  . MENISCUS DEBRIDEMENT Left 2011   Left knee  . REPLACEMENT TOTAL KNEE Left 07/2013  . TONSILLECTOMY      Family History  Problem Relation Age of Onset  . Dementia Mother   . Diabetes Father   . Alzheimer's disease Father     SOCIAL HX:  reports that he has never smoked. He has never used  smokeless tobacco. He reports current alcohol use of about 2.0 standard drinks of alcohol per week. He reports that he does not use drugs.   Current Outpatient Medications:  .  amiodarone (PACERONE) 200 MG tablet, TAKE 1 TABLET BY MOUTH TWO  TIMES DAILY, Disp: 180 tablet, Rfl: 2 .  B Complex Vitamins (B COMPLEX 50 PO), Take 1 tablet by mouth daily. , Disp: , Rfl:  .  buPROPion (WELLBUTRIN XL) 150 MG 24 hr tablet, TAKE 3 TABLETS BY MOUTH  DAILY, Disp: 270 tablet, Rfl: 3 .  Cholecalciferol (VITAMIN D3) 2000 UNITS TABS, Take 1 tablet by mouth daily. , Disp: , Rfl:  .  cyclobenzaprine (FLEXERIL) 10 MG tablet, Take 1 tablet (10 mg total) by mouth 3 (three) times daily as needed for muscle spasms., Disp: 30 tablet, Rfl: 0 .  dabigatran (PRADAXA) 150 MG CAPS capsule, TAKE 1 CAPSULE BY MOUTH 2 TIMES DAILY, Disp: 180 capsule, Rfl: 3 .  docusate sodium (DULCOLAX) 100 MG capsule, Take 400 mg by mouth daily. , Disp: , Rfl:  .  Ferrous Gluconate 324 (37.5 Fe) MG TABS, Take 1 tablet by mouth daily. , Disp: , Rfl:  .  furosemide (LASIX) 40 MG tablet, Take 1 tablet (40 mg total) by mouth 2 (two) times daily as needed., Disp: 180 tablet, Rfl: 3 .  gabapentin (NEURONTIN) 300 MG capsule, Take 1 capsule (300 mg total) by mouth 3 (three) times daily., Disp: 90 capsule, Rfl: 0 .  GLUCOSAMINE HCL-MSM PO, Take 1 tablet by mouth daily. , Disp: , Rfl:  .  HYDROcodone-acetaminophen (NORCO) 10-325 MG tablet, Take 1 tablet by mouth every 8 (eight) hours as needed for severe pain., Disp: 90 tablet, Rfl: 0 .  HYDROcodone-acetaminophen (NORCO) 10-325 MG tablet, Take 1 tablet by mouth every 8 (eight) hours as needed., Disp: 90 tablet, Rfl: 0 .  HYDROcodone-acetaminophen (NORCO) 10-325 MG tablet, Take 1 tablet by mouth every 8 (eight) hours as needed for severe pain., Disp: 90 tablet, Rfl: 0 .  losartan-hydrochlorothiazide (HYZAAR) 100-25 MG tablet, Take 1 tablet by mouth daily., Disp: 90 tablet, Rfl: 0 .  metoprolol succinate  (TOPROL-XL) 25 MG 24 hr tablet, TAKE 1 TABLET(25 MG) BY MOUTH DAILY, Disp: 90 tablet, Rfl: 1 .  Multiple Vitamin (MULTIVITAMIN) tablet, Take 1 tablet by mouth daily., Disp: , Rfl:  .  nitroGLYCERIN (NITROSTAT) 0.4 MG SL tablet, Place 1 tablet (0.4 mg total) under the tongue every 5 (five) minutes as needed for chest pain., Disp: 30 tablet, Rfl: 3 .  Potassium Chloride ER 20 MEQ TBCR, Take 20 mEq by mouth 2 (two) times daily as needed., Disp: 180 tablet, Rfl: 3 .  propranolol (INDERAL) 20 MG tablet, Take 20 mg by mouth 3 (three) times daily as needed. Reported on 03/22/2016, Disp: , Rfl:  .  venlafaxine XR (EFFEXOR-XR) 37.5 MG 24 hr capsule, TAKE 1 CAPSULE BY MOUTH  DAILY WITH BREAKFAST, Disp: 90 capsule, Rfl:  1 .  vitamin B-12 (CYANOCOBALAMIN) 500 MCG tablet, Take 500 mcg by mouth daily., Disp: , Rfl:  .  vitamin E 400 UNIT capsule, Take 400 Units by mouth daily., Disp: , Rfl:  .  levothyroxine (SYNTHROID) 25 MCG tablet, TAKE 1 TABLET BY MOUTH  DAILY BEFORE BREAKFAST, Disp: 90 tablet, Rfl: 3  EXAM:   General impression: alert, cooperative and articulate.  No signs of being in distress  Lungs: speech is fluent sentence length suggests that patient is not short of breath and not punctuated by cough, sneezing or sniffing. Marland Kitchen   Psych: affect normal.  speech is articulate and non pressured .  Denies suicidal thoughts    ASSESSMENT AND PLAN:  Discussed the following assessment and plan:  Subclinical iodine-deficiency hypothyroidism  Polyarthritis of multiple sites  Neuropathy  Hypothyroidism Fatigue has worsened since he stopped Synthroid a month ago,  Resume low dose Synthroid,  stopped for unclar reasons   Polyarthritis of multiple sites  recent blood tests were normal ,  Which suggests that the cause of his joint pain is unlikely to be an autoimmune or inflammatory arthritis . He is s/p right knee replacement Sept 2018 and he has now retired from his [place] job during which he stood for  8 hours daily. 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 Marland Kitchen  His Refill history was confirmed via [place] Controlled Substance database by me today.   I have refilled his hydrocodone for  Use not more than 3 times daily for Dec, January and February   and advised him to maximize his tylenol use to 2000 mg daily. .  NSAIDs are contraindicated due to history of gastric bypass   Neuropathy Small fiber.  Improved with gabapentin three times daily    I discussed the assessment and treatment plan with the patient. The patient was provided an opportunity to ask questions and all were answered. The patient agreed with the plan and demonstrated an understanding of the instructions.   The patient was advised to call back or seek an in-person evaluation if the symptoms worsen or if the condition fails to improve as anticipated.  I provided  25 minutes of non-face-to-face time during this encounter reviewing patient's current problems and past procedures/imaging studies, providing counseling on the above mentioned problems , and coordination  of care .   Johnathan Mc, MD

## 2019-09-18 NOTE — Assessment & Plan Note (Signed)
Fatigue has worsened since he stopped Synthroid a month ago,  Resume low dose Synthroid,  stopped for unclar reasons

## 2019-09-28 ENCOUNTER — Telehealth: Payer: Self-pay | Admitting: Internal Medicine

## 2019-09-28 MED ORDER — HYDROCODONE-ACETAMINOPHEN 10-325 MG PO TABS: 1.0000 | ORAL_TABLET | Freq: Three times a day (TID) | ORAL | 0 refills | Status: DC | PRN

## 2019-09-28 NOTE — Telephone Encounter (Signed)
Refilled: 08/28/2019 Last OV: 09/18/2019 Next OV: 12/19/2019

## 2019-09-28 NOTE — Telephone Encounter (Signed)
Pt called in and said he called in prescription request for Hydrocodone two weeks and it has not been sent to [facility] in [place].

## 2019-09-28 NOTE — Telephone Encounter (Signed)
Spoke with [facility] and they stated that they do not have a rx for hydrocodone nor have the filled one.

## 2019-09-28 NOTE — Telephone Encounter (Signed)
The December hydrocodone refill was sent in error to mail order   I have resent to local pharm..  Please call the mail order company and cancel

## 2019-09-29 ENCOUNTER — Other Ambulatory Visit: Payer: Self-pay | Admitting: Cardiovascular Disease

## 2019-10-01 ENCOUNTER — Other Ambulatory Visit: Payer: Self-pay

## 2019-10-01 MED ORDER — METOPROLOL SUCCINATE ER 25 MG PO TB24: ORAL_TABLET | ORAL | 1 refills | Status: DC

## 2019-10-02 ENCOUNTER — Other Ambulatory Visit: Payer: Self-pay

## 2019-10-02 ENCOUNTER — Other Ambulatory Visit: Payer: Self-pay | Admitting: Cardiovascular Disease

## 2019-10-02 DIAGNOSIS — I10 Essential (primary) hypertension: Secondary | ICD-10-CM

## 2019-10-02 MED ORDER — LOSARTAN POTASSIUM-HCTZ 100-25 MG PO TABS: 1.0000 | ORAL_TABLET | Freq: Every day | ORAL | 0 refills | Status: DC

## 2019-10-02 NOTE — Telephone Encounter (Signed)
Requested Prescriptions   Signed Prescriptions Disp Refills  . losartan-hydrochlorothiazide (HYZAAR) 100-25 MG tablet 90 tablet 0    Sig: Take 1 tablet by mouth daily.    Authorizing Provider: Minna Merritts    Ordering User: Raelene Bott, Kyjuan Gause L   Per [facility], insurance will only cover 90 day supply.

## 2019-10-15 ENCOUNTER — Other Ambulatory Visit: Payer: Self-pay | Admitting: Cardiovascular Disease

## 2019-10-29 ENCOUNTER — Telehealth: Payer: Self-pay | Admitting: Internal Medicine

## 2019-10-29 MED ORDER — HYDROCODONE-ACETAMINOPHEN 10-325 MG PO TABS: 1.0000 | ORAL_TABLET | Freq: Three times a day (TID) | ORAL | 0 refills | Status: DC | PRN

## 2019-10-29 NOTE — Telephone Encounter (Signed)
Sent to local pharmacy NOT MAIL ORDER

## 2019-10-29 NOTE — Telephone Encounter (Signed)
Refilled: 09/28/2019 Last OV: 09/18/2019 Next OV: 12/19/2019

## 2019-10-29 NOTE — Telephone Encounter (Signed)
Pt needs a refill for HYDROcodone-acetaminophen (NORCO) 10-325 MG tablet sent to [facility] in [place].

## 2019-11-04 NOTE — Progress Notes (Signed)
Cardiology Office Note  Date:  11/05/2019   ID:  Johnathan [name], DOB [birth year], MRN [id number]  PCP:  Sherlene Shams, MD   Chief Complaint  Patient presents with  . other    12 month follow up. Meds reviewed by the pt. verbally. Pt. c/o shortness of breath with exertion.     HPI:  Johnathan [name] is a pleasant 64 year old gentleman with  Morbid obesity chronic joint pain of the shoulders and knees on long-term low-dose narcotics,  hypertension,   bariatric surgery,  atrial fibrillation in August 2013,   normal ejection fraction at that time  sleep apnea, currently not on his CPAP who presents for follow-up of his atrial fibrillation.   2 months ago, Working outside, may have been dehydrated Putting siding on  house [place] backwards after he stood up too quickly, got dizzy No recurrent symptoms   chronic bilateral knee pain, back pain joint pain Unable to sit for prolonged periods of time  total knee replacement 2019  Rarely takes lasix  Echo 2016 reviewed with him - Left ventricle: The cavity size was normal. There was mild   concentric hypertrophy. Systolic function was mildly to   moderately reduced. The estimated ejection fraction was in the   range of 40% to 45%. Images were inadequate for LV wall motion   assessment. - Mitral valve: There was mild regurgitation. - Left atrium: The atrium was severely dilated. - Pulmonary arteries: Systolic pressure was within the normal   range.  Weight continues to trend upwards Denies any tachycardia or palpitations concerning for atrial fibrillation Shortness of breath stable  Tolerating Pradaxa previously had trouble on Xarelto and Eliquis.  He does have significant bleeding if he cuts himself  EKG personally reviewed by myself on todays visit Shows sinus bradycardia rate 56 bpm PVCs, no significant ST or T wave changes noted   Other past medical history  previous cardioversion,successful in restoring normal sinus  rhythm   tick bite in June 2016, he developed atrial fibrillation initially with RVR. Started on digoxin, continued on metoprolol and diltiazem with improved heart rate. Echocardiogram and stress test at that time Cardiac exam shows slightly depressed ejection fraction, dilated left atrium, mild LVH. Ejection fraction estimated at 40-45%. Stress test with small region of apical perfusion defect, depressed ejection fraction.  Previously reported having tachycardia when he feels hot, improved when he cools down Previously had significant shoulder pain Prior clinic visit with shortness of breath that improved with Lasix  In the hospital 05/31/2012 with left-sided chest pain and palpitations. He was given adenosine in the emergency room that showed atrial fibrillation. He received Cardizem and started on diltiazem infusion and converted to normal sinus rhythm overnight. He was started on anticoagulation, continued on long-acting Cardizem and was discharged after echocardiogram showed normal LV function.  Echocardiogram showed normal LV function with ejection fraction 50-55%, normal right ventricular systolic pressure, normal left atrium TSH 1.7  PMH:   has a past medical history of Arthritis, B12 deficiency, Chronic systolic CHF (congestive heart failure) (HCC), History of DVT (deep vein thrombosis) (2004), History of stress test, Hypertension, Hypothyroidism, Neuropathy, Obesity, OSA (obstructive sleep apnea), and PAF (paroxysmal atrial fibrillation) (HCC).  PSH:    Past Surgical History:  Procedure Laterality Date  . ARTHROSCOPIC REPAIR ACL Bilateral 1993   bilateral knee x2 on left and x2 on right  . BARIATRIC SURGERY  2004   Rou en Y   . BARIATRIC SURGERY    . CHOLECYSTECTOMY  1982  . ELECTROPHYSIOLOGIC STUDY N/A 06/20/2015   Procedure: CARDIOVERSION;  Surgeon: Antonieta Iba, MD;  Location: [facility] ORS;  Service: Cardiovascular;  Laterality: N/A;  . KNEE ARTHROPLASTY Right 06/20/2017    Procedure: COMPUTER ASSISTED TOTAL KNEE ARTHROPLASTY;  Surgeon: Donato Heinz, MD;  Location: [facility] ORS;  Service: Orthopedics;  Laterality: Right;  . MENISCUS DEBRIDEMENT Left 2011   Left knee  . REPLACEMENT TOTAL KNEE Left 07/2013  . TONSILLECTOMY     Current Outpatient Medications on File Prior to Visit  Medication Sig Dispense Refill  . amiodarone (PACERONE) 200 MG tablet TAKE 1 TABLET BY MOUTH TWO  TIMES DAILY 180 tablet 2  . B Complex Vitamins (B COMPLEX 50 PO) Take 1 tablet by mouth daily.     Marland Kitchen buPROPion (WELLBUTRIN XL) 150 MG 24 hr tablet TAKE 3 TABLETS BY MOUTH  DAILY 270 tablet 3  . Cholecalciferol (VITAMIN D3) 2000 UNITS TABS Take 1 tablet by mouth daily.     . dabigatran (PRADAXA) 150 MG CAPS capsule TAKE 1 CAPSULE BY MOUTH TWICE DAILY 180 capsule 0  . docusate sodium (DULCOLAX) 100 MG capsule Take 400 mg by mouth daily.     . Ferrous Gluconate 324 (37.5 Fe) MG TABS Take 1 tablet by mouth daily.     . furosemide (LASIX) 40 MG tablet Take 1 tablet (40 mg total) by mouth 2 (two) times daily as needed. 180 tablet 3  . gabapentin (NEURONTIN) 300 MG capsule Take 1 capsule (300 mg total) by mouth 3 (three) times daily. 90 capsule 0  . GLUCOSAMINE HCL-MSM PO Take 1 tablet by mouth daily.     Marland Kitchen HYDROcodone-acetaminophen (NORCO) 10-325 MG tablet Take 1 tablet by mouth every 8 (eight) hours as needed for severe pain. 90 tablet 0  . HYDROcodone-acetaminophen (NORCO) 10-325 MG tablet Take 1 tablet by mouth every 8 (eight) hours as needed for severe pain. 90 tablet 0  . levothyroxine (SYNTHROID) 25 MCG tablet TAKE 1 TABLET BY MOUTH  DAILY BEFORE BREAKFAST 90 tablet 3  . losartan-hydrochlorothiazide (HYZAAR) 100-25 MG tablet Take 1 tablet by mouth daily. 90 tablet 0  . metoprolol succinate (TOPROL-XL) 25 MG 24 hr tablet TAKE 1 TABLET(25 MG) BY MOUTH DAILY 90 tablet 1  . Multiple Vitamin (MULTIVITAMIN) tablet Take 1 tablet by mouth daily.    . nitroGLYCERIN (NITROSTAT) 0.4 MG SL tablet  Place 1 tablet (0.4 mg total) under the tongue every 5 (five) minutes as needed for chest pain. 30 tablet 3  . Potassium Chloride ER 20 MEQ TBCR Take 20 mEq by mouth 2 (two) times daily as needed. 180 tablet 3  . propranolol (INDERAL) 20 MG tablet Take 20 mg by mouth 3 (three) times daily as needed. Reported on 03/22/2016    . venlafaxine XR (EFFEXOR-XR) 37.5 MG 24 hr capsule TAKE 1 CAPSULE BY MOUTH  DAILY WITH BREAKFAST 90 capsule 1  . vitamin B-12 (CYANOCOBALAMIN) 500 MCG tablet Take 500 mcg by mouth daily.    . vitamin E 400 UNIT capsule Take 400 Units by mouth daily.    Marland Kitchen HYDROcodone-acetaminophen (NORCO) 10-325 MG tablet Take 1 tablet by mouth every 8 (eight) hours as needed. (Patient not taking: Reported on 11/05/2019) 90 tablet 0   No current facility-administered medications on file prior to visit.     Allergies:   Patient has no known allergies.   Social History:  The patient  reports that he has never smoked. He has never used smokeless tobacco. He reports current  alcohol use of about 2.0 standard drinks of alcohol per week. He reports that he does not use drugs.   Family History:   family history includes Alzheimer's disease in his father; Dementia in his mother; Diabetes in his father.    Review of Systems: Review of Systems  Constitutional: Negative.        Weight gain  Respiratory: Negative.   Cardiovascular: Negative.   Gastrointestinal: Negative.   Musculoskeletal: Positive for joint pain.  Neurological: Negative.   Psychiatric/Behavioral: Negative.   All other systems reviewed and are negative.    PHYSICAL EXAM: VS:  BP (!) 142/80 (BP Location: Left Arm, Patient Position: Sitting, Cuff Size: Large)   Pulse (!) 56   Temp (!) 97.3 F (36.3 C)   Ht 6\' 3"  (1.905 m)   Wt (!) 395 lb 8 oz (179.4 kg)   BMI 49.43 kg/m  , BMI Body mass index is 49.43 kg/m. Constitutional:  oriented to person, place, and time. No distress.  Obese HENT:  Head: Grossly normal Eyes:  no  discharge. No scleral icterus.  Neck: No JVD, no carotid bruits  Cardiovascular: Regular rate and rhythm, no murmurs appreciated 1+ pitting edema lower extremities right greater than left Pulmonary/Chest: Clear to auscultation bilaterally, no wheezes or rails Abdominal: Soft.  no distension.  no tenderness.  Musculoskeletal: Normal range of motion Neurological:  normal muscle tone. Coordination normal. No atrophy Skin: Skin warm and dry Psychiatric: normal affect, pleasant  Recent Labs: 09/04/2019: ALT 22; BUN 17; Creatinine, Ser 0.94; Hemoglobin 14.2; Platelets 165.0; Potassium 4.2; Sodium 137; TSH 3.56    Lipid Panel Lab Results  Component Value Date   CHOL 182 09/28/2018   HDL 45 09/28/2018   LDLCALC 96 09/28/2018   TRIG 204 (H) 09/28/2018      Wt Readings from Last 3 Encounters:  11/05/19 (!) 395 lb 8 oz (179.4 kg)  09/18/19 (!) 375 lb (170.1 kg)  09/04/19 (!) 395 lb (179.2 kg)     ASSESSMENT AND PLAN:  Atrial fibrillation, unspecified type ([facility]) - Plan: EKG 12-Lead Maintaining normal sinus rhythm On amiodarone. pradaxa He previously declined decreasing amiodarone dosing from twice daily down to daily Discussed with him again that he could decrease amiodarone down to once a day TSH 3.5, normal LFTs  Essential hypertension - Plan: EKG 12-Lead Blood pressure is mildly elevated, likely from long walk into the office  no changes made to the medications.   Acute on chronic diastolic CHF (congestive heart failure) (HCC) Appears euvolemic.  Does not take much Lasix No changes to medications Chronic stable leg swelling, right greater than left likely exacerbated by prior DVT on the right  Severe obesity (BMI >= 40) (HCC) Unable to exercise secondary to chronic knee pain  Eating more, and active Recommended walking program as tolerated, low carbohydrate diet   Total encounter time more than 25 minutes  Greater than 50% was spent in counseling and coordination of care  with the patient  Disposition:   F/U  12 months   No orders of the defined types were placed in this encounter.    Signed, Esmond Plants, M.D., Ph.D. 11/05/2019  Lucas, Anton

## 2019-11-05 ENCOUNTER — Other Ambulatory Visit: Payer: Self-pay

## 2019-11-05 ENCOUNTER — Ambulatory Visit (INDEPENDENT_AMBULATORY_CARE_PROVIDER_SITE_OTHER): Payer: BC Managed Care – PPO | Admitting: Cardiovascular Disease

## 2019-11-05 ENCOUNTER — Encounter: Payer: Self-pay | Admitting: Cardiovascular Disease

## 2019-11-05 VITALS — BP 142/80 | HR 56 | Temp 97.3°F | Ht 75.0 in | Wt 395.5 lb

## 2019-11-05 DIAGNOSIS — Z86718 Personal history of other venous thrombosis and embolism: Secondary | ICD-10-CM | POA: Diagnosis not present

## 2019-11-05 DIAGNOSIS — R0602 Shortness of breath: Secondary | ICD-10-CM

## 2019-11-05 DIAGNOSIS — I872 Venous insufficiency (chronic) (peripheral): Secondary | ICD-10-CM

## 2019-11-05 DIAGNOSIS — I4819 Other persistent atrial fibrillation: Secondary | ICD-10-CM | POA: Diagnosis not present

## 2019-11-05 DIAGNOSIS — I5043 Acute on chronic combined systolic (congestive) and diastolic (congestive) heart failure: Secondary | ICD-10-CM | POA: Diagnosis not present

## 2019-11-05 DIAGNOSIS — I10 Essential (primary) hypertension: Secondary | ICD-10-CM | POA: Diagnosis not present

## 2019-11-05 DIAGNOSIS — E7849 Other hyperlipidemia: Secondary | ICD-10-CM

## 2019-11-05 MED ORDER — FUROSEMIDE 40 MG PO TABS: 40.0000 mg | ORAL_TABLET | Freq: Every day | ORAL | 3 refills | Status: DC | PRN

## 2019-11-05 NOTE — Patient Instructions (Signed)

## 2019-11-06 ENCOUNTER — Ambulatory Visit: Payer: BC Managed Care – PPO | Admitting: Family Medicine

## 2019-11-16 ENCOUNTER — Ambulatory Visit: Payer: Self-pay | Admitting: Family Medicine

## 2019-11-20 ENCOUNTER — Other Ambulatory Visit: Payer: Self-pay | Admitting: Internal Medicine

## 2019-11-28 ENCOUNTER — Other Ambulatory Visit: Payer: Self-pay | Admitting: Internal Medicine

## 2019-11-28 ENCOUNTER — Other Ambulatory Visit: Payer: Self-pay | Admitting: Family Medicine

## 2019-11-28 MED ORDER — HYDROCODONE-ACETAMINOPHEN 10-325 MG PO TABS: 1.0000 | ORAL_TABLET | Freq: Three times a day (TID) | ORAL | 0 refills | Status: DC | PRN

## 2019-11-28 NOTE — Telephone Encounter (Signed)
Refilled: 10/29/2019 Last OV: 09/18/2019 Next OV: 12/19/2019

## 2019-11-28 NOTE — Telephone Encounter (Signed)
Pt needs hydrocodone prescription called into [facility]. He has is almost out.

## 2019-12-12 ENCOUNTER — Other Ambulatory Visit: Payer: Self-pay | Admitting: Family Medicine

## 2019-12-14 ENCOUNTER — Encounter: Payer: Self-pay | Admitting: Family Medicine

## 2019-12-14 ENCOUNTER — Ambulatory Visit (INDEPENDENT_AMBULATORY_CARE_PROVIDER_SITE_OTHER): Payer: BC Managed Care – PPO | Admitting: Family Medicine

## 2019-12-14 DIAGNOSIS — M130 Polyarthritis, unspecified: Secondary | ICD-10-CM | POA: Diagnosis not present

## 2019-12-14 MED ORDER — GABAPENTIN 300 MG PO CAPS: 300.0000 mg | ORAL_CAPSULE | Freq: Three times a day (TID) | ORAL | 3 refills | Status: DC

## 2019-12-14 NOTE — Progress Notes (Signed)
Virtual Visit via Video Note  I connected with Johnathan [name] on 12/14/19 at  3:15 PM EST by a video enabled telemedicine application and verified that I am speaking with the correct person using two identifiers.  Location: Patient: Patient is in home with wife Provider: In office setting   I discussed the limitations of evaluation and management by telemedicine and the availability of in person appointments. The patient expressed understanding and agreed to proceed.  History of Present Illness: 64 year old gentleman with multiple comorbidities following up for polyarthralgia.  Has been responding very well though to the gabapentin.  Needs another refill.  Has been sometime since we have seen him.  Feels the increase in the 300 mg 3 times a day has been very helpful.  Patient denies any side effects.  Happy with the results at this moment.    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 The patient was provided an opportunity to ask questions and all were answered. The patient agreed with the plan and demonstrated an understanding of the instructions.   The patient was advised to call back or seek an in-person evaluation if the symptoms worsen or if the condition fails to improve as anticipated.  I provided 11 minutes of face-to-face time during this encounter.   Judi Saa, DO

## 2019-12-19 ENCOUNTER — Telehealth (INDEPENDENT_AMBULATORY_CARE_PROVIDER_SITE_OTHER): Payer: BC Managed Care – PPO | Admitting: Internal Medicine

## 2019-12-19 ENCOUNTER — Encounter: Payer: Self-pay | Admitting: Internal Medicine

## 2019-12-19 VITALS — Ht 75.0 in | Wt 385.0 lb

## 2019-12-19 DIAGNOSIS — I10 Essential (primary) hypertension: Secondary | ICD-10-CM

## 2019-12-19 DIAGNOSIS — I4819 Other persistent atrial fibrillation: Secondary | ICD-10-CM

## 2019-12-19 DIAGNOSIS — E02 Subclinical iodine-deficiency hypothyroidism: Secondary | ICD-10-CM

## 2019-12-19 DIAGNOSIS — Z125 Encounter for screening for malignant neoplasm of prostate: Secondary | ICD-10-CM

## 2019-12-19 DIAGNOSIS — M159 Polyosteoarthritis, unspecified: Secondary | ICD-10-CM

## 2019-12-19 DIAGNOSIS — E7849 Other hyperlipidemia: Secondary | ICD-10-CM

## 2019-12-19 DIAGNOSIS — I480 Paroxysmal atrial fibrillation: Secondary | ICD-10-CM

## 2019-12-19 DIAGNOSIS — Z6841 Body Mass Index (BMI) 40.0 and over, adult: Secondary | ICD-10-CM

## 2019-12-19 NOTE — Progress Notes (Signed)
Virtual Visit via [place]  This visit type was conducted due to national recommendations for restrictions regarding the COVID-19 pandemic (e.g. social distancing).  This format is felt to be most appropriate for this patient at this time.  All issues noted in this document were discussed and addressed.  No physical exam was performed (except for noted visual exam findings with Video Visits).   I connected with@ on 12/19/19 at  3:00 PM EST by a video enabled telemedicine application  and verified that I am speaking with the correct person using two identifiers. Location patient: home Location provider: work or home office Persons participating in the virtual visit: patient, provider  I discussed the limitations, risks, security and privacy concerns of performing an evaluation and management service by telephone and the availability of in person appointments. I also discussed with the patient that there may be a patient responsible charge related to this service. The patient expressed understanding and agreed to proceed.  Reason for visit: follow up   HPI:  64 yr old male with chronic pain secondary to polyarthritis , NSAIDS C/I secondary to gastric bypass surgery,  Morbid obesity ,  Atrial fib, presents for follow up.  Refill history confirmed via [place] Controlled Substance databas, accessed by me today..  Seeing Charlann Boxer . Serologies for autoimmune/inflammatory arthropathies repeated and again negative.  taking gabepentin 300 mg tid,   vicodin tid  pain has increased due to increased outdoor activity .  Still falling due to tripping over tree roots.  No injuries    PAF:  Pulse remains controlled,  No shortness of breath other than his baseline due to his size.   Hypertension: patient checks blood pressure twice weekly at home.  Readings have been for the most part < 140/80 at rest . Patient is following a reduced salt diet most days and is taking medications as prescribed  Obesity:   Following a careful diet 5 days per week  Weight unchanged.   ROS: See pertinent positives and negatives per HPI.  Past Medical History:  Diagnosis Date  . Arthritis    a. chronic joint pain  . B12 deficiency   . Chronic systolic CHF (congestive heart failure) ([place])    a. echo 2013: EF of 50-55%, normal right ventricular systolic pressure, normal left atrium; b. EF 40-45%, inadequate for LV wall motion, mild MR, LA severely dilated @ 52 mm, PASP nl  . History of DVT (deep vein thrombosis) 2004   s/p bariatric surgery  . History of stress test    a. there was no ST segment deviation noted during stress,    There is a small defect of moderate severity present in the apex location, suggestive of apical ischemia, intermediate risk, calculated EF 21% but visually appears to be 35-40%  . Hypertension   . Hypothyroidism   . Neuropathy   . Obesity   . OSA (obstructive sleep apnea)    a. not compliant with CPAP  . PAF (paroxysmal atrial fibrillation) ([place])    a. CHADSVASc at least 2 (HTN and vascular disease); b. on Eliquis     Past Surgical History:  Procedure Laterality Date  . ARTHROSCOPIC REPAIR ACL Bilateral 1993   bilateral knee x2 on left and x2 on right  . BARIATRIC SURGERY  2004   [place]   . BARIATRIC SURGERY    . CHOLECYSTECTOMY  1982  . ELECTROPHYSIOLOGIC STUDY N/A 06/20/2015   Procedure: CARDIOVERSION;  Surgeon: Minna Merritts, MD;  Location: [facility]  ORS;  Service: Cardiovascular;  Laterality: N/A;  . KNEE ARTHROPLASTY Right 06/20/2017   Procedure: COMPUTER ASSISTED TOTAL KNEE ARTHROPLASTY;  Surgeon: Donato Heinz, MD;  Location: [facility] ORS;  Service: Orthopedics;  Laterality: Right;  . MENISCUS DEBRIDEMENT Left 2011   Left knee  . REPLACEMENT TOTAL KNEE Left 07/2013  . TONSILLECTOMY      Family History  Problem Relation Age of Onset  . Dementia Mother   . Diabetes Father   . Alzheimer's disease Father     SOCIAL HX:  reports that he has never smoked. He has never  used smokeless tobacco. He reports current alcohol use of about 2.0 standard drinks of alcohol per week. He reports that he does not use drugs.   Current Outpatient Medications:  .  amiodarone (PACERONE) 200 MG tablet, TAKE 1 TABLET BY MOUTH TWO  TIMES DAILY, Disp: 180 tablet, Rfl: 2 .  B Complex Vitamins (B COMPLEX 50 PO), Take 1 tablet by mouth daily. , Disp: , Rfl:  .  buPROPion (WELLBUTRIN XL) 150 MG 24 hr tablet, TAKE 3 TABLETS BY MOUTH  DAILY, Disp: 270 tablet, Rfl: 3 .  Cholecalciferol (VITAMIN D3) 2000 UNITS TABS, Take 1 tablet by mouth daily. , Disp: , Rfl:  .  dabigatran (PRADAXA) 150 MG CAPS capsule, TAKE 1 CAPSULE BY MOUTH TWICE DAILY, Disp: 180 capsule, Rfl: 0 .  docusate sodium (DULCOLAX) 100 MG capsule, Take 400 mg by mouth daily. , Disp: , Rfl:  .  Ferrous Gluconate 324 (37.5 Fe) MG TABS, Take 1 tablet by mouth daily. , Disp: , Rfl:  .  furosemide (LASIX) 40 MG tablet, Take 1 tablet (40 mg total) by mouth daily as needed., Disp: 90 tablet, Rfl: 3 .  gabapentin (NEURONTIN) 300 MG capsule, Take 1 capsule (300 mg total) by mouth 3 (three) times daily., Disp: 270 capsule, Rfl: 3 .  GLUCOSAMINE HCL-MSM PO, Take 1 tablet by mouth daily. , Disp: , Rfl:  .  [START ON 12/27/2019] HYDROcodone-acetaminophen (NORCO) 10-325 MG tablet, Take 1 tablet by mouth every 8 (eight) hours as needed for severe pain., Disp: 90 tablet, Rfl: 0 .  [START ON 02/25/2020] HYDROcodone-acetaminophen (NORCO) 10-325 MG tablet, Take 1 tablet by mouth every 8 (eight) hours as needed., Disp: 90 tablet, Rfl: 0 .  [START ON 01/26/2020] HYDROcodone-acetaminophen (NORCO) 10-325 MG tablet, Take 1 tablet by mouth every 8 (eight) hours as needed for severe pain., Disp: 90 tablet, Rfl: 0 .  levothyroxine (SYNTHROID) 25 MCG tablet, TAKE 1 TABLET BY MOUTH  DAILY BEFORE BREAKFAST, Disp: 90 tablet, Rfl: 3 .  losartan-hydrochlorothiazide (HYZAAR) 100-25 MG tablet, Take 1 tablet by mouth daily., Disp: 90 tablet, Rfl: 0 .  metoprolol  succinate (TOPROL-XL) 25 MG 24 hr tablet, TAKE 1 TABLET(25 MG) BY MOUTH DAILY, Disp: 90 tablet, Rfl: 1 .  Multiple Vitamin (MULTIVITAMIN) tablet, Take 1 tablet by mouth daily., Disp: , Rfl:  .  nitroGLYCERIN (NITROSTAT) 0.4 MG SL tablet, Place 1 tablet (0.4 mg total) under the tongue every 5 (five) minutes as needed for chest pain., Disp: 30 tablet, Rfl: 3 .  Potassium Chloride ER 20 MEQ TBCR, Take 20 mEq by mouth 2 (two) times daily as needed., Disp: 180 tablet, Rfl: 3 .  propranolol (INDERAL) 20 MG tablet, Take 20 mg by mouth 3 (three) times daily as needed. Reported on 03/22/2016, Disp: , Rfl:  .  venlafaxine XR (EFFEXOR-XR) 37.5 MG 24 hr capsule, TAKE 1 CAPSULE BY MOUTH  DAILY WITH BREAKFAST, Disp: 90  capsule, Rfl: 1 .  vitamin B-12 (CYANOCOBALAMIN) 500 MCG tablet, Take 500 mcg by mouth daily., Disp: , Rfl:  .  vitamin E 400 UNIT capsule, Take 400 Units by mouth daily., Disp: , Rfl:   EXAM:  VITALS per patient if applicable:  GENERAL: alert, oriented, appears well and in no acute distress  HEENT: atraumatic, conjunttiva clear, no obvious abnormalities on inspection of external nose and ears  Oral:  Missing several teeth in the front , missing or broken .   NECK: normal movements of the head and neck  LUNGS: on inspection no signs of respiratory distress, breathing rate appears normal, no obvious gross SOB, gasping or wheezing  CV: no obvious cyanosis  MS: moves all visible extremities without noticeable abnormality  PSYCH/NEURO: pleasant and cooperative, no obvious depression or anxiety, speech and thought processing grossly intact  ASSESSMENT AND PLAN:  Discussed the following assessment and plan:  Essential hypertension - Plan: Comprehensive metabolic panel  Other hyperlipidemia - Plan: Lipid panel  Subclinical iodine-deficiency hypothyroidism - Plan: TSH  Prostate cancer screening - Plan: PSA  Persistent atrial fibrillation (HCC)  PAF (paroxysmal atrial fibrillation)  (HCC)  Class 3 severe obesity with serious comorbidity and body mass index (BMI) of 45.0 to 49.9 in adult, unspecified obesity type (HCC)  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 Resting rate 50's  Without metoprolol.     PAF (paroxysmal atrial fibrillation) S/p cardioversion remotely,  Taking pradaxa,  Rate controlled and sinus brady by last EKG .  Has untreated OSA which is likely going to trigger events   Obesity Body mass index is 48.12 kg/m.  History of bariatri surgery . Current weight is 385 lbs by home scale. I have addressed  BMI and recommended a low glycemic index diet utilizing smaller more frequent meals to increase metabolism.  I have also recommended that patient start exercising with a goal of 30 minutes of walking a minimum of 5 days per week.   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 Marland Kitchen  His Refill history was confirmed via  Controlled Substance database by me today.   I have refilled his hydrocodone for  Use not more than 3 times daily for 3 months   and advised him to maximize his tylenol use to 2000 mg daily. .  NSAIDs are contraindicated due to history of gastric bypass     I discussed the assessment and treatment plan with the patient. The patient was provided an opportunity to ask questions and all were answered. The patient agreed with the plan and demonstrated an  understanding of the instructions.   The patient was advised to call back or seek an in-person evaluation if the symptoms worsen or if the condition fails to improve as anticipated.  I provided  30 minutes of non-face-to-face time during this encounter reviewing  patient's current problems and past surgeries, labs and imaging studies, providing counseling on the above mentioned problems , and coordination  of care .   Sherlene Shams, MD

## 2019-12-21 MED ORDER — HYDROCODONE-ACETAMINOPHEN 10-325 MG PO TABS: 1.0000 | ORAL_TABLET | Freq: Three times a day (TID) | ORAL | 0 refills | Status: DC | PRN

## 2019-12-21 NOTE — Assessment & Plan Note (Signed)
Patient has not had an additional episodes of severe bradycardia,  Asymptomatic , in the office, . Resting rate 50's  Without metoprolol.

## 2019-12-21 NOTE — Assessment & Plan Note (Addendum)
Body mass index is 48.12 kg/m.  History of bariatri surgery . Current weight is 385 lbs by home scale. I have addressed  BMI and recommended a low glycemic index diet utilizing smaller more frequent meals to increase metabolism.  I have also recommended that patient start exercising with a goal of 30 minutes of walking a minimum of 5 days per week.

## 2019-12-21 NOTE — Assessment & Plan Note (Addendum)
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 Marland Kitchen  His Refill history was confirmed via [place] Controlled Substance database by me today.   I have refilled his hydrocodone for  Use not more than 3 times daily for 3 months   and advised him to maximize his tylenol use to 2000 mg daily. .  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

## 2019-12-21 NOTE — Assessment & Plan Note (Signed)
He has Resume low dose Synthroid,    Lab Results  Component Value Date   TSH 3.56 09/04/2019

## 2019-12-21 NOTE — Assessment & Plan Note (Addendum)
S/p cardioversion remotely,  Taking pradaxa,  Rate controlled and sinus brady by last EKG .  Has untreated OSA which is likely going to trigger events

## 2020-01-13 ENCOUNTER — Other Ambulatory Visit: Payer: Self-pay | Admitting: Cardiovascular Disease

## 2020-01-14 MED ORDER — DABIGATRAN ETEXILATE MESYLATE 150 MG PO CAPS: ORAL_CAPSULE | ORAL | 2 refills | Status: DC

## 2020-01-14 NOTE — Telephone Encounter (Signed)
*  STAT* If patient is at the pharmacy, call can be transferred to refill team.   1. Which medications need to be refilled? (please list name of each medication and dose if known) Pradaxa 150 bid  2. Which pharmacy/location (including street and city if local pharmacy) is medication to be sent to? [facility] in [place]  3. Do they need a 30 day or 90 day supply? 90

## 2020-01-28 ENCOUNTER — Encounter: Payer: Self-pay | Admitting: Cardiovascular Disease

## 2020-02-01 ENCOUNTER — Telehealth: Payer: Self-pay | Admitting: Cardiovascular Disease

## 2020-02-01 NOTE — Telephone Encounter (Signed)
1. What dental office are you calling from? [facility]  2. What is your office phone number? [phone]  3. What is your fax number? [phone]  4. What type of procedure is the patient having performed? Dental treatment: xrays, tooth extractions, denture fabrication  5. What date is procedure scheduled or is the patient there now? Not listed 6.  (if the patient is at the dentist's office question goes to their cardiologist if he/she is in the office.  If not, question should go to the DOD). Patient does not have a visit scheduled   7. What is your question (ex. Antibiotics prior to procedure, holding medication-we need to know how long dentist wants pt to hold med)? Patient currently taking Pradaxa, please advise if patient should stop

## 2020-02-01 NOTE — Telephone Encounter (Signed)
Patient with diagnosis of afib on pradaxa for anticoagulation.    Procedure: pulling 16 teeth Date of procedure: TBD  CHADS2-VASc score of  2 (CHF, HTN) Of note, patient does have a history of DVT in 2004 after bariatric surgery  CrCl 135 ml/min  Per office protocol, patient can hold Pradaxa for 1 day prior to procedure.

## 2020-02-01 NOTE — Telephone Encounter (Signed)
Need clarification on the dental work being done before we can provide guidance.

## 2020-02-01 NOTE — Telephone Encounter (Signed)
Spoke with patient and he is having 16 teeth removed and will have IV sedation. He reports that previously he had something done medically and he did have to hold 3 days prior and restarted after that. He could not remember what that was but he did remember holding it. Advised that I would check with provider and pharmacy. He verbalized understanding with no further questions at this time.

## 2020-02-01 NOTE — Telephone Encounter (Signed)
Left voicemail message for them to call back for clarification of what is needed. Will also route to provider and pharmacy to see if they have recommendations.

## 2020-02-01 NOTE — Telephone Encounter (Signed)
Left voicemail message to call back  

## 2020-02-04 NOTE — Telephone Encounter (Signed)
Left voicemail message on how long to hold pradaxa one day and that still pending Dr. Mariah Milling clearance information as well.

## 2020-02-11 ENCOUNTER — Other Ambulatory Visit: Payer: BC Managed Care – PPO

## 2020-03-05 ENCOUNTER — Telehealth: Payer: Self-pay | Admitting: Internal Medicine

## 2020-03-05 DIAGNOSIS — Z125 Encounter for screening for malignant neoplasm of prostate: Secondary | ICD-10-CM

## 2020-03-05 DIAGNOSIS — E7849 Other hyperlipidemia: Secondary | ICD-10-CM

## 2020-03-05 DIAGNOSIS — E02 Subclinical iodine-deficiency hypothyroidism: Secondary | ICD-10-CM

## 2020-03-05 DIAGNOSIS — I10 Essential (primary) hypertension: Secondary | ICD-10-CM

## 2020-03-05 NOTE — Telephone Encounter (Signed)
Pt needs lab orders sent to [facility] in [place]

## 2020-03-06 NOTE — Telephone Encounter (Signed)
Pt is going to [facility] in [place] to have labs drawn. Labs ordered were PSA, TSH, CMP and lipid panel is there anything else that needs to be ordered to have drawn at [facility]?

## 2020-03-06 NOTE — Telephone Encounter (Signed)
Perfect thanks

## 2020-03-06 NOTE — Addendum Note (Signed)
Addended by: Sandy Salaam on: 03/06/2020 04:21 PM   Modules accepted: Orders

## 2020-03-07 DIAGNOSIS — E7849 Other hyperlipidemia: Secondary | ICD-10-CM | POA: Diagnosis not present

## 2020-03-07 DIAGNOSIS — Z125 Encounter for screening for malignant neoplasm of prostate: Secondary | ICD-10-CM | POA: Diagnosis not present

## 2020-03-07 DIAGNOSIS — E02 Subclinical iodine-deficiency hypothyroidism: Secondary | ICD-10-CM | POA: Diagnosis not present

## 2020-03-07 DIAGNOSIS — I10 Essential (primary) hypertension: Secondary | ICD-10-CM | POA: Diagnosis not present

## 2020-03-07 NOTE — Telephone Encounter (Signed)
Pt had labs done this morning.

## 2020-03-08 LAB — COMPREHENSIVE METABOLIC PANEL
ALT: 25 IU/L (ref 0–44)
AST: 24 IU/L (ref 0–40)
Albumin/Globulin Ratio: 1.6 (ref 1.2–2.2)
Albumin: 4.4 g/dL (ref 3.8–4.8)
Alkaline Phosphatase: 105 IU/L (ref 48–121)
BUN/Creatinine Ratio: 17 (ref 10–24)
BUN: 19 mg/dL (ref 8–27)
Bilirubin Total: 0.5 mg/dL (ref 0.0–1.2)
CO2: 21 mmol/L (ref 20–29)
Calcium: 8.9 mg/dL (ref 8.6–10.2)
Chloride: 101 mmol/L (ref 96–106)
Creatinine, Ser: 1.09 mg/dL (ref 0.76–1.27)
GFR calc Af Amer: 82 mL/min/{1.73_m2} (ref >59)
GFR calc non Af Amer: 71 mL/min/{1.73_m2} (ref >59)
Globulin, Total: 2.7 g/dL (ref 1.5–4.5)
Glucose: 92 mg/dL (ref 65–99)
Potassium: 4.6 mmol/L (ref 3.5–5.2)
Sodium: 137 mmol/L (ref 134–144)
Total Protein: 7.1 g/dL (ref 6.0–8.5)

## 2020-03-08 LAB — LIPID PANEL
Chol/HDL Ratio: 4.2 ratio (ref 0.0–5.0)
Cholesterol, Total: 164 mg/dL (ref 100–199)
HDL: 39 mg/dL — ABNORMAL LOW (ref >39)
LDL Chol Calc (NIH): 102 mg/dL — ABNORMAL HIGH (ref 0–99)
Triglycerides: 131 mg/dL (ref 0–149)
VLDL Cholesterol Cal: 23 mg/dL (ref 5–40)

## 2020-03-08 LAB — TSH: TSH: 3.22 u[IU]/mL (ref 0.450–4.500)

## 2020-03-08 LAB — PSA: Prostate Specific Ag, Serum: 0.7 ng/mL (ref 0.0–4.0)

## 2020-03-17 ENCOUNTER — Other Ambulatory Visit: Payer: BC Managed Care – PPO

## 2020-03-19 ENCOUNTER — Ambulatory Visit: Payer: BC Managed Care – PPO | Admitting: Internal Medicine

## 2020-03-19 ENCOUNTER — Other Ambulatory Visit: Payer: Self-pay

## 2020-03-19 ENCOUNTER — Encounter: Payer: Self-pay | Admitting: Internal Medicine

## 2020-03-19 DIAGNOSIS — M130 Polyarthritis, unspecified: Secondary | ICD-10-CM

## 2020-03-19 DIAGNOSIS — R600 Localized edema: Secondary | ICD-10-CM | POA: Diagnosis not present

## 2020-03-19 DIAGNOSIS — E039 Hypothyroidism, unspecified: Secondary | ICD-10-CM

## 2020-03-19 DIAGNOSIS — R296 Repeated falls: Secondary | ICD-10-CM

## 2020-03-19 DIAGNOSIS — F330 Major depressive disorder, recurrent, mild: Secondary | ICD-10-CM

## 2020-03-19 MED ORDER — HYDROCODONE-ACETAMINOPHEN 10-325 MG PO TABS: 1.0000 | ORAL_TABLET | Freq: Three times a day (TID) | ORAL | 0 refills | Status: DC | PRN

## 2020-03-19 NOTE — Patient Instructions (Signed)
Weight loss idea excellent!    Will help your knees   Hydrocodone refilled for June July and August

## 2020-03-19 NOTE — Progress Notes (Signed)
Subjective:  Patient ID: Johnathan [name], male    DOB: [birth year]  Age: 64 y.o. MRN: [id number]  CC: Diagnoses of Bilateral leg edema, Acquired hypothyroidism, Polyarthritis of multiple sites, Severe obesity (BMI >= 40) (HCC), Frequent falls, and Mild episode of recurrent major depressive disorder (HCC) were pertinent to this visit.  HPI Johnathan [name] presents for follow up on chronic issues   This visit occurred during the SARS-CoV-2 public health emergency.  Safety protocols were in place, including screening questions prior to the visit, additional usage of staff PPE, and extensive cleaning of exam room while observing appropriate contact time as indicated for disinfecting solutions.    Patient has received NO  doses of the available COVID 19 vaccines.   Patient continues to mask when outside of the home except when walking in yard or at safe distances from others .  Patient denies any change in mood or development of unhealthy behaviors resuting from the pandemic's restriction of activities and socialization.     Cc: reviewed depression screening questionnairee answers.  He denies depression,  States that his positive answers are due more to increasing boredom since he retired and has been confined to home by the COVID pandemic.  Sleeping in,  Staying up late watching tv , and tending his garden and chickens .  Wife still works so he gets up with her,  then goes back to bed.  Has not started an exercise program or limited his food intake and he has gained weight.  Taking wellbutrin maximal dose.    Obesity:  Plans to start weight loss as a result of the planned Oral surgery tomorrow [facility]  When he will have at least 18 teeth to be removed.  pradaxa suspended by gollan today.  Will require excision of several of the teeth. .  Plans to be on a liquid diet for 3 weeks using protein powder , greek yogurt.  Soups  Fatigue:  Chronic, Has untreated sleep apnea, so does wife [name].     Neither  are treated due to mask intolerance. Patient sleeping in a chair due to polyarthritis:  Chair supports most of his joints. . Not walking well . Using a walker.  Mother passed from COVID in November,  Going to [place]  , [place] in August  For a [employer].  20 hour rental car ride.       Recurrent falls: occurring in the yard.  Usually doesn't hurt himself  But had one recently that really hurt because he fell forward, with left arm trapped under his body.  Titanium knees hurt to kneel  on. Has foot drop on the left isolated to great toe, which causes him to stumble when it doesn't clear floor.  His pain is unchanged,  although he has retired from [employer] work which required standing for long periods of time and has had a total  knee replacement .  He has multiple joints that are the causes of his pain.  He cannot use NSAIDs due to his history of gastric bypass, Refill history confirmed via [place] Controlled Substance database, accessed by me today.Marland KitchenHe is using 3 Vicodin daily for management of his pain . He is getting outside daily to work in the yard,  But notes that his leg muscles have become weakened  since he has been unable to exercise at the gym due to COVID enforced closures.  Has deferred neurology referral.  Chronic pain: His pain is unchanged,  although  he has retired from [employer] work which required standing for long periods of time and has had a total  knee replacement .  He has multiple joints that are the causes of his pain.  He cannot use NSAIDs due to his history of gastric bypass, Refill history confirmed via [place] Controlled Substance database, accessed by me today.Marland KitchenHe is using 3 Vicodin daily for management of his pain . He is getting outside daily to work in the yard,  But notes that his leg muscles have become weakened  since he has been unable to exercise at the gym due to COVID enforced closures.      Outpatient Medications Prior to Visit  Medication Sig Dispense Refill    . amiodarone (PACERONE) 200 MG tablet TAKE 1 TABLET BY MOUTH  TWICE DAILY 180 tablet 2  . B Complex Vitamins (B COMPLEX 50 PO) Take 1 tablet by mouth daily.     Marland Kitchen buPROPion (WELLBUTRIN XL) 150 MG 24 hr tablet TAKE 3 TABLETS BY MOUTH  DAILY 270 tablet 3  . Cholecalciferol (VITAMIN D3) 2000 UNITS TABS Take 1 tablet by mouth daily.     . dabigatran (PRADAXA) 150 MG CAPS capsule TAKE 1 CAPSULE BY MOUTH TWICE DAILY 180 capsule 2  . docusate sodium (DULCOLAX) 100 MG capsule Take 400 mg by mouth daily.     . Ferrous Gluconate 324 (37.5 Fe) MG TABS Take 1 tablet by mouth daily.     . furosemide (LASIX) 40 MG tablet Take 1 tablet (40 mg total) by mouth daily as needed. 90 tablet 3  . gabapentin (NEURONTIN) 300 MG capsule Take 1 capsule (300 mg total) by mouth 3 (three) times daily. 270 capsule 3  . GLUCOSAMINE HCL-MSM PO Take 1 tablet by mouth daily.     Marland Kitchen levothyroxine (SYNTHROID) 25 MCG tablet TAKE 1 TABLET BY MOUTH  DAILY BEFORE BREAKFAST 90 tablet 3  . losartan-hydrochlorothiazide (HYZAAR) 100-25 MG tablet Take 1 tablet by mouth daily. 90 tablet 0  . metoprolol succinate (TOPROL-XL) 25 MG 24 hr tablet TAKE 1 TABLET(25 MG) BY MOUTH DAILY 90 tablet 1  . Multiple Vitamin (MULTIVITAMIN) tablet Take 1 tablet by mouth daily.    . nitroGLYCERIN (NITROSTAT) 0.4 MG SL tablet Place 1 tablet (0.4 mg total) under the tongue every 5 (five) minutes as needed for chest pain. 30 tablet 3  . Potassium Chloride ER 20 MEQ TBCR Take 20 mEq by mouth 2 (two) times daily as needed. 180 tablet 3  . propranolol (INDERAL) 20 MG tablet Take 20 mg by mouth 3 (three) times daily as needed. Reported on 03/22/2016    . venlafaxine XR (EFFEXOR-XR) 37.5 MG 24 hr capsule TAKE 1 CAPSULE BY MOUTH  DAILY WITH BREAKFAST 90 capsule 1  . vitamin B-12 (CYANOCOBALAMIN) 500 MCG tablet Take 500 mcg by mouth daily.    . vitamin E 400 UNIT capsule Take 400 Units by mouth daily.    Marland Kitchen HYDROcodone-acetaminophen (NORCO) 10-325 MG tablet Take 1  tablet by mouth every 8 (eight) hours as needed for severe pain. 90 tablet 0  . HYDROcodone-acetaminophen (NORCO) 10-325 MG tablet Take 1 tablet by mouth every 8 (eight) hours as needed. 90 tablet 0  . HYDROcodone-acetaminophen (NORCO) 10-325 MG tablet Take 1 tablet by mouth every 8 (eight) hours as needed for severe pain. 90 tablet 0   No facility-administered medications prior to visit.    Review of Systems;  Patient denies headache, fevers, malaise, unintentional weight loss, skin rash, eye pain, sinus  congestion and sinus pain, sore throat, dysphagia,  hemoptysis , cough, dyspnea, wheezing, chest pain, palpitations, orthopnea, edema, abdominal pain, nausea, melena, diarrhea, constipation, flank pain, dysuria, hematuria, urinary  Frequency, nocturia, numbness, tingling, seizures,  Focal weakness, Loss of consciousness,  Tremor, insomnia, depression, anxiety, and suicidal ideation.      Objective:  BP 140/74 (BP Location: Left Arm, Patient Position: Sitting, Cuff Size: Large)   Pulse (!) 58   Temp (!) 96.2 F (35.7 C) (Temporal)   Resp 16   Ht 6\' 3"  (1.905 m)   Wt (!) 388 lb 3.2 oz (176.1 kg)   SpO2 97%   BMI 48.52 kg/m   BP Readings from Last 3 Encounters:  03/19/20 140/74  11/05/19 (!) 142/80  09/18/19 (!) 147/79    Wt Readings from Last 3 Encounters:  03/19/20 (!) 388 lb 3.2 oz (176.1 kg)  12/19/19 (!) 385 lb (174.6 kg)  11/05/19 (!) 395 lb 8 oz (179.4 kg)    General appearance: alert, cooperative and appears stated age Ears: normal TM's and external ear canals both ears Throat: lips, mucosa, and tongue normal; teeth and gums normal Neck: no adenopathy, no carotid bruit, supple, symmetrical, trachea midline and thyroid not enlarged, symmetric, no tenderness/mass/nodules Back: symmetric, no curvature. ROM normal. No CVA tenderness. Lungs: clear to auscultation bilaterally Heart: regular rate and rhythm, S1, S2 normal, no murmur, click, rub or gallop Abdomen: soft,  non-tender; bowel sounds normal; no masses,  no organomegaly Pulses: 2+ and symmetric Skin: Skin color, texture, turgor normal. No rashes or lesions Lymph nodes: Cervical, supraclavicular, and axillary nodes normal.  Lab Results  Component Value Date   HGBA1C 5.6 03/29/2018   HGBA1C 5.0 03/29/2016    Lab Results  Component Value Date   CREATININE 1.09 03/07/2020   CREATININE 0.94 09/04/2019   CREATININE 1.13 09/28/2018    Lab Results  Component Value Date   WBC 6.8 09/04/2019   HGB 14.2 09/04/2019   HCT 42.6 09/04/2019   PLT 165.0 09/04/2019   GLUCOSE 92 03/07/2020   CHOL 164 03/07/2020   TRIG 131 03/07/2020   HDL 39 (L) 03/07/2020   LDLCALC 102 (H) 03/07/2020   ALT 25 03/07/2020   AST 24 03/07/2020   NA 137 03/07/2020   K 4.6 03/07/2020   CL 101 03/07/2020   CREATININE 1.09 03/07/2020   BUN 19 03/07/2020   CO2 21 03/07/2020   TSH 3.220 03/07/2020   INR 1.21 06/09/2017   HGBA1C 5.6 03/29/2018    No results found.  Assessment & Plan:   Problem List Items Addressed This Visit      Unprioritized   Acquired hypothyroidism    Thyroid function is WNL on current dose.  No current changes needed.   Lab Results  Component Value Date   TSH 3.220 03/07/2020         Bilateral leg edema    Multifactorial, with systolic dysfunction, untreated OSA,  and Venous insufficiency secondary to morbid obesity .  He states that he is  unable to wear compression stockings during the summer. Advised to elevate legs and start a walking program      Frequent falls    Again Recommended PT evaluation for work in strengthening proximal muscle strength but he declines at this time.       Major depressive disorder with current active episode    Aggravated by winter season, improved with increase in  wellbutrin dose to 450 mg daily and regular participation in outdoor activities  Polyarthritis of multiple sites     recent blood tests were normal ,  Which suggests that the  cause of his joint pain is unlikely to be an autoimmune or inflammatory arthritis . 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 job for over a year.   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 Marland Kitchen  His Refill history was confirmed via [place] Controlled Substance database by me today.   I have refilled his hydrocodone for  Use not more than 3 times daily for Dec, January and February   and advised him to maximize his tylenol use to 2000 mg daily. .  NSAIDs are contraindicated due to history of gastric bypass       Severe obesity (BMI >= 40) (HCC)    He plans to jumpstart his weight loss with a medically necessary liquid diet due to extraction of all teeth by oral surgery in a few days. Advised to use high protein low carb drinks and start walking.          I am having Alexandria A. Khouri maintain his Ferrous Gluconate, multivitamin, B Complex Vitamins (B COMPLEX 50 PO), vitamin B-12, Vitamin D3, GLUCOSAMINE HCL-MSM PO, vitamin E, nitroGLYCERIN, Potassium Chloride ER, propranolol, docusate sodium, buPROPion, levothyroxine, metoprolol succinate, losartan-hydrochlorothiazide, furosemide, venlafaxine XR, gabapentin, amiodarone, dabigatran, HYDROcodone-acetaminophen, HYDROcodone-acetaminophen, and HYDROcodone-acetaminophen.  Meds ordered this encounter  Medications  . HYDROcodone-acetaminophen (NORCO) 10-325 MG tablet    Sig: Take 1 tablet by mouth every 8 (eight) hours as needed for severe pain.    Dispense:  90 tablet    Refill:  0  . HYDROcodone-acetaminophen (NORCO) 10-325 MG tablet    Sig: Take 1 tablet by mouth every 8 (eight) hours as needed.    Dispense:  90 tablet    Refill:  0  . HYDROcodone-acetaminophen (NORCO) 10-325 MG tablet    Sig: Take 1 tablet by mouth every 8 (eight) hours as needed for severe pain.    Dispense:  90 tablet     Refill:  0    Medications Discontinued During This Encounter  Medication Reason  . HYDROcodone-acetaminophen (NORCO) 10-325 MG tablet Reorder  . HYDROcodone-acetaminophen (NORCO) 10-325 MG tablet Reorder  . HYDROcodone-acetaminophen (NORCO) 10-325 MG tablet Reorder    Follow-up: No follow-ups on file.   Crecencio Mc, MD

## 2020-03-21 NOTE — Assessment & Plan Note (Addendum)
He plans to jumpstart his weight loss with a medically necessary liquid diet due to extraction of all teeth by oral surgery in a few days. Advised to use high protein low carb drinks and start walking.

## 2020-03-21 NOTE — Assessment & Plan Note (Signed)
Thyroid function is WNL on current dose.  No current changes needed.   Lab Results  Component Value Date   TSH 3.220 03/07/2020

## 2020-03-21 NOTE — Assessment & Plan Note (Signed)
Again Recommended PT evaluation for work in strengthening proximal muscle strength but he declines at this time.

## 2020-03-21 NOTE — Assessment & Plan Note (Signed)
Multifactorial, with systolic dysfunction, untreated OSA,  and Venous insufficiency secondary to morbid obesity .  He states that he is  unable to wear compression stockings during the summer. Advised to elevate legs and start a walking program

## 2020-03-21 NOTE — Assessment & Plan Note (Signed)
Aggravated by winter season, improved with increase in  wellbutrin dose to 450 mg daily and regular participation in outdoor activities

## 2020-03-21 NOTE — Assessment & Plan Note (Signed)
recent blood tests were normal ,  Which suggests that the cause of his joint pain is unlikely to be an autoimmune or inflammatory arthritis . 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 Marland Kitchen  His Refill history was confirmed via [place] Controlled Substance database by me today.   I have refilled his hydrocodone for  Use not more than 3 times daily for Dec, January and February   and advised him to maximize his tylenol use to 2000 mg daily. .  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

## 2020-03-23 ENCOUNTER — Other Ambulatory Visit: Payer: Self-pay | Admitting: Cardiovascular Disease

## 2020-03-23 DIAGNOSIS — I10 Essential (primary) hypertension: Secondary | ICD-10-CM

## 2020-03-27 ENCOUNTER — Other Ambulatory Visit: Payer: Self-pay

## 2020-03-27 MED ORDER — METOPROLOL SUCCINATE ER 25 MG PO TB24: ORAL_TABLET | ORAL | 1 refills | Status: DC

## 2020-04-06 ENCOUNTER — Other Ambulatory Visit: Payer: Self-pay | Admitting: Internal Medicine

## 2020-04-24 ENCOUNTER — Telehealth: Payer: Self-pay | Admitting: Internal Medicine

## 2020-04-24 MED ORDER — HYDROCODONE-ACETAMINOPHEN 10-325 MG PO TABS: 1.0000 | ORAL_TABLET | Freq: Three times a day (TID) | ORAL | 0 refills | Status: DC | PRN

## 2020-04-24 NOTE — Telephone Encounter (Signed)
Pt states that the pharmacy does not have rx of HYDROcodone-acetaminophen (NORCO) 10-325 MG tablet for this month. They state that they only have august and sept. Please advise

## 2020-04-24 NOTE — Telephone Encounter (Signed)
Problem solved July  Refills were  inadvertently sent to mail order for him and his wife.

## 2020-04-30 ENCOUNTER — Ambulatory Visit: Payer: BC Managed Care – PPO | Admitting: Internal Medicine

## 2020-05-26 ENCOUNTER — Other Ambulatory Visit: Payer: Self-pay | Admitting: Internal Medicine

## 2020-05-29 ENCOUNTER — Ambulatory Visit (INDEPENDENT_AMBULATORY_CARE_PROVIDER_SITE_OTHER): Payer: BC Managed Care – PPO | Admitting: Internal Medicine

## 2020-05-29 ENCOUNTER — Other Ambulatory Visit: Payer: Self-pay

## 2020-05-29 ENCOUNTER — Encounter: Payer: Self-pay | Admitting: Internal Medicine

## 2020-05-29 VITALS — BP 130/88 | HR 70 | Temp 98.5°F | Resp 15 | Ht 75.0 in | Wt 349.8 lb

## 2020-05-29 DIAGNOSIS — Z0000 Encounter for general adult medical examination without abnormal findings: Secondary | ICD-10-CM

## 2020-05-29 DIAGNOSIS — R600 Localized edema: Secondary | ICD-10-CM | POA: Diagnosis not present

## 2020-05-29 DIAGNOSIS — M159 Polyosteoarthritis, unspecified: Secondary | ICD-10-CM

## 2020-05-29 NOTE — Progress Notes (Signed)
Patient ID: Johnathan [name], male    DOB: June 27, 1956  Age: 64 y.o. MRN: [id number]  The patient is here for annual preventvie examination and management of other chronic and acute problems.   The risk factors are reflected in the social history.  The roster of all physicians providing medical care to patient - is listed in the Snapshot section of the chart.  Activities of daily living:  The patient is 100% independent in all ADLs: dressing, toileting, feeding as well as independent mobility  Home safety : The patient has smoke detectors in the home. They wear seatbelts.  There are no firearms at home. There is no violence in the home.   There is no risks for hepatitis, STDs or HIV. There is no   history of blood transfusion. They have no travel history to infectious disease endemic areas of the world.  The patient has seen their dentist in the last six month. They have seen their eye doctor in the last year. They admit to slight hearing difficulty with regard to whispered voices and some television programs.  They have deferred audiologic testing in the last year.  They do not  have excessive sun exposure. Discussed the need for sun protection: hats, long sleeves and use of sunscreen if there is significant sun exposure.   Diet: the importance of a healthy diet is discussed. Diet is still soft food : cottage cheese,  Yogurt.  Baked chicken for dinner (all soft food  1500 cal or less  60 g protein since having all teeth pulled.  Has lost 40 lbs   The benefits of regular aerobic exercise were discussed. He is not exercising regularly due to polyarthralgias and chronic pain .   Depression screen: there are no signs or vegative symptoms of depression- irritability, change in appetite, anhedonia, sadness/tearfullness.  Cognitive assessment: the patient manages all their financial and personal affairs and is actively engaged. They could relate day,date,year and events; recalled 2/3 objects at 3  minutes; performed clock-face test normally.  The following portions of the patient's history were reviewed and updated as appropriate: allergies, current medications, past family history, past medical history,  past surgical history, past social history  and problem list.  Visual acuity was not assessed per patient preference since she has regular follow up with her ophthalmologist. Hearing and body mass index were assessed and reviewed.   During the course of the visit the patient was educated and counseled about appropriate screening and preventive services including : fall prevention , diabetes screening, nutrition counseling, colorectal cancer screening, and recommended immunizations.    CC: The primary encounter diagnosis was Encounter for preventive health examination. 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  Has nocturia x 3 every 10 days since the weight loss started.  Urine is pale,  1/2 gallon on those nights   History Cristofer has a past medical history of Arthritis, B12 deficiency, Chronic systolic CHF (congestive heart failure) (HCC), History of DVT (deep vein thrombosis) (2004), History of stress test, Hypertension, Hypothyroidism, Neuropathy, Obesity, OSA (obstructive sleep apnea), and PAF (paroxysmal atrial fibrillation) (HCC).   He has a past surgical history that includes Bariatric Surgery (2004); Arthroscopic repair ACL (Bilateral, 1993); Cholecystectomy (1982); Meniscus debridement (Left, 2011); Replacement total knee (Left, 07/2013); Bariatric Surgery; Cardiac catheterization (N/A, 06/20/2015); Tonsillectomy; and Knee Arthroplasty (Right, 06/20/2017).   His family history  includes Alzheimer's disease in his father; Dementia in his mother; Diabetes in his father.He reports that he has never smoked. He has never used smokeless tobacco. He  reports current alcohol use of about 2.0 standard drinks of alcohol per week. He reports that he does not use drugs.  Outpatient Medications Prior to Visit  Medication Sig Dispense Refill  . amiodarone (PACERONE) 200 MG tablet TAKE 1 TABLET BY MOUTH  TWICE DAILY 180 tablet 2  . B Complex Vitamins (B COMPLEX 50 PO) Take 1 tablet by mouth daily.     Marland Kitchen. buPROPion (WELLBUTRIN XL) 150 MG 24 hr tablet TAKE 3 TABLETS BY MOUTH  DAILY 270 tablet 3  . Cholecalciferol (VITAMIN D3) 2000 UNITS TABS Take 1 tablet by mouth daily.     . dabigatran (PRADAXA) 150 MG CAPS capsule TAKE 1 CAPSULE BY MOUTH TWICE DAILY 180 capsule 2  . docusate sodium (DULCOLAX) 100 MG capsule Take 400 mg by mouth daily.     . Ferrous Gluconate 324 (37.5 Fe) MG TABS Take 1 tablet by mouth daily.     . furosemide (LASIX) 40 MG tablet Take 1 tablet (40 mg total) by mouth daily as needed. 90 tablet 3  . gabapentin (NEURONTIN) 300 MG capsule Take 1 capsule (300 mg total) by mouth 3 (three) times daily. 270 capsule 3  . GLUCOSAMINE HCL-MSM PO Take 1 tablet by mouth daily.     Marland Kitchen. HYDROcodone-acetaminophen (NORCO) 10-325 MG tablet Take 1 tablet by mouth every 8 (eight) hours as needed for severe pain. 90 tablet 0  . HYDROcodone-acetaminophen (NORCO) 10-325 MG tablet Take 1 tablet by mouth every 8 (eight) hours as needed for severe pain. 90 tablet 0  . HYDROcodone-acetaminophen (NORCO) 10-325 MG tablet Take 1 tablet by mouth every 8 (eight) hours as needed. 90 tablet 0  . levothyroxine (SYNTHROID) 25 MCG tablet TAKE 1 TABLET BY MOUTH  DAILY BEFORE BREAKFAST 90 tablet 3  . losartan-hydrochlorothiazide (HYZAAR) 100-25 MG tablet TAKE 1 TABLET BY MOUTH DAILY 90 tablet 1  . metoprolol succinate (TOPROL-XL) 25 MG 24 hr tablet TAKE 1 TABLET(25 MG) BY MOUTH DAILY 90 tablet 1  . Multiple Vitamin (MULTIVITAMIN) tablet Take 1 tablet by mouth daily.    . nitroGLYCERIN (NITROSTAT) 0.4 MG SL tablet Place 1 tablet (0.4 mg total) under the tongue every 5  (five) minutes as needed for chest pain. 30 tablet 3  . Potassium Chloride ER 20 MEQ TBCR Take 20 mEq by mouth 2 (two) times daily as needed. 180 tablet 3  . propranolol (INDERAL) 20 MG tablet Take 20 mg by mouth 3 (three) times daily as needed. Reported on 03/22/2016    . venlafaxine XR (EFFEXOR-XR) 37.5 MG 24 hr capsule TAKE 1 CAPSULE BY MOUTH  DAILY WITH BREAKFAST 90 capsule 1  . vitamin B-12 (CYANOCOBALAMIN) 500 MCG tablet Take 500 mcg by mouth daily.    . vitamin E 400 UNIT capsule Take 400 Units by mouth daily.     No facility-administered medications prior to visit.    Review of Systems   Patient denies headache, fevers, malaise, unintentional weight loss, skin rash, eye pain, sinus congestion and sinus pain, sore throat, dysphagia,  hemoptysis , cough, dyspnea, wheezing, chest pain, palpitations, orthopnea, edema, abdominal pain, nausea, melena, diarrhea, constipation, flank pain, dysuria, hematuria, urinary  Frequency, nocturia, numbness, tingling, seizures,  Focal weakness, Loss of consciousness,  Tremor, insomnia, depression, anxiety, and suicidal ideation.       Objective:  BP 130/88 (BP Location: Left  Arm, Patient Position: Sitting, Cuff Size: Large)   Pulse 70   Temp 98.5 F (36.9 C) (Oral)   Resp 15   Ht 6\' 3"  (1.905 m)   Wt (!) 349 lb 12.8 oz (158.7 kg)   SpO2 95%   BMI 43.72 kg/m   Physical Exam  General appearance: alert, cooperative and appears stated age Ears: normal TM's and external ear canals both ears Throat: lips, mucosa, and tongue normal; teeth and gums normal Neck: no adenopathy, no carotid bruit, supple, symmetrical, trachea midline and thyroid not enlarged, symmetric, no tenderness/mass/nodules Back: symmetric, no curvature. ROM normal. No CVA tenderness. Lungs: clear to auscultation bilaterally Heart: regular rate and rhythm, S1, S2 normal, no murmur, click, rub or gallop Abdomen: soft, non-tender; bowel sounds normal; no masses,  no  organomegaly Pulses: 2+ and symmetric Skin: Skin color, texture, turgor normal. No rashes or lesions Lymph nodes: Cervical, supraclavicular, and axillary nodes normal.  Assessment & Plan:   Problem List Items Addressed This Visit      Unprioritized   Bilateral leg edema    Improved,  Multifactorial, with systolic dysfunction, untreated OSA,  and Venous insufficiency secondary to morbid obesity .  He states that he is  unable to wear compression stockings during the summer.      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 10-18-1987  His Refill history was confirmed via [place] Controlled Substance database by me today.   I have refilled his hydrocodone for  Use not more than 3 times daily for Sept, Oct and November  and advised him to maximize his tylenol use to 2000 mg daily. .  NSAIDs are contraindicated due to history of gastric bypass       Encounter for preventive health examination - Primary    age appropriate education and counseling updated, referrals for preventative services and immunizations addressed, dietary and smoking counseling addressed, most recent labs reviewed.  I have personally reviewed and have noted:  1) the patient's medical and social history 2) The pt's use of alcohol, tobacco, and illicit drugs 3) The patient's current medications and supplements 4) Functional ability including ADL's, fall risk, home safety risk, hearing and visual impairment 5) Diet and physical activities 6) Evidence for depression or mood disorder 7) The patient's height, weight, and BMI have been recorded in the chart  I have made referrals, and provided  counseling and education based on review of the above      Relevant Orders   Hemoglobin A1c (Completed)   Severe obesity (BMI >= 40) (HCC)    I have congratulated him in reduction of   BMI and encouraged  Continued weight loss with goal of 10% of body weigh over the next 6 months using a low glycemic index diet and regular exercise a minimum of 5 days per week.           I am having Taylon A. Elizondo maintain his Ferrous Gluconate, multivitamin, B Complex Vitamins (B COMPLEX 50 PO), vitamin B-12, Vitamin D3, GLUCOSAMINE HCL-MSM PO, vitamin E, nitroGLYCERIN, Potassium Chloride ER, propranolol, docusate sodium, buPROPion, levothyroxine, furosemide, gabapentin,  amiodarone, dabigatran, HYDROcodone-acetaminophen, HYDROcodone-acetaminophen, losartan-hydrochlorothiazide, venlafaxine XR, HYDROcodone-acetaminophen, and metoprolol succinate.  No orders of the defined types were placed in this encounter.   There are no discontinued medications.  Follow-up: No follow-ups on file.   Sherlene Shams, MD

## 2020-05-29 NOTE — Patient Instructions (Signed)

## 2020-05-30 LAB — HEMOGLOBIN A1C
Est. average glucose Bld gHb Est-mCnc: 114 mg/dL
Hgb A1c MFr Bld: 5.6 % (ref 4.8–5.6)

## 2020-05-31 DIAGNOSIS — Z0000 Encounter for general adult medical examination without abnormal findings: Secondary | ICD-10-CM | POA: Insufficient documentation

## 2020-05-31 NOTE — Assessment & Plan Note (Signed)

## 2020-05-31 NOTE — Assessment & Plan Note (Signed)
Improved,  Multifactorial, with systolic dysfunction, untreated OSA,  and Venous insufficiency secondary to morbid obesity .  He states that he is  unable to wear compression stockings during the summer.

## 2020-05-31 NOTE — Assessment & Plan Note (Signed)
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 .  His Refill history was confirmed via [place] Controlled Substance database by me today.   I have refilled his hydrocodone for  Use not more than 3 times daily for Sept, Oct and November  and advised him to maximize his tylenol use to 2000 mg daily. .  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  

## 2020-05-31 NOTE — Assessment & Plan Note (Signed)
I have congratulated him in reduction of   BMI and encouraged  Continued weight loss with goal of 10% of body weigh over the next 6 months using a low glycemic index diet and regular exercise a minimum of 5 days per week.   

## 2020-06-25 ENCOUNTER — Telehealth (INDEPENDENT_AMBULATORY_CARE_PROVIDER_SITE_OTHER): Payer: BC Managed Care – PPO | Admitting: Internal Medicine

## 2020-06-25 ENCOUNTER — Other Ambulatory Visit: Payer: Self-pay

## 2020-06-25 ENCOUNTER — Encounter: Payer: Self-pay | Admitting: Internal Medicine

## 2020-06-25 DIAGNOSIS — I4819 Other persistent atrial fibrillation: Secondary | ICD-10-CM | POA: Diagnosis not present

## 2020-06-25 DIAGNOSIS — M159 Polyosteoarthritis, unspecified: Secondary | ICD-10-CM

## 2020-06-25 DIAGNOSIS — I10 Essential (primary) hypertension: Secondary | ICD-10-CM | POA: Diagnosis not present

## 2020-06-25 MED ORDER — HYDROCODONE-ACETAMINOPHEN 10-325 MG PO TABS: 1.0000 | ORAL_TABLET | Freq: Three times a day (TID) | ORAL | 0 refills | Status: DC | PRN

## 2020-06-25 MED ORDER — HYDROCODONE-ACETAMINOPHEN 10-325 MG PO TABS
1.0000 | ORAL_TABLET | Freq: Three times a day (TID) | ORAL | 0 refills | Status: DC | PRN
Start: 2020-08-24 — End: 2020-08-28

## 2020-06-25 NOTE — Assessment & Plan Note (Signed)
Rate controlled based on patient's monitoring with amiodarone and metoprolol

## 2020-06-25 NOTE — Assessment & Plan Note (Signed)
Not Well controlled on current regimen of losartan-hctz  Based o reading done today at home  he reports compliance with medication regimen  but has an elevated reading today in office.  She is not using NSAIDs daily.  Discussed goal of 120/70  (130/80 for patients over 70)  to preserve renal function.  She has been asked to check her  BP  at home and  submit readings for evaluation. Renal function, electrolytes and screen for proteinuria are all normal .[id number]

## 2020-06-25 NOTE — Progress Notes (Signed)
Virtual Visit converted to Telephone  Note  This visit type was conducted due to national recommendations for restrictions regarding the COVID-19 pandemic (e.g. social distancing).  This format is felt to be most appropriate for this patient at this time.  All issues noted in this document were discussed and addressed.  No physical exam was performed (except for noted visual exam findings with Video Visits).   I attempted connection with@ on 06/25/20 at  3:00 PM EDT by a video enabled telemedicine application or telephone and verified that I am speaking with the correct person using two identifiers. Location patient: home Location provider: work or home office Persons participating in the virtual visit: patient, provider  I discussed the limitations, risks, security and privacy concerns of performing an evaluation and management service by telephone and the availability of in person appointments. I also discussed with the patient that there may be a patient responsible charge related to this service. The patient expressed understanding and agreed to [employer] audio and video telecommunications were attempted between this provider and patient, however failed, due to patient having technical difficulties   We continued and completed visit with audio only.   Reason for visit: medication refill  HPI:  64 yr old male with morbid obesity, chronic back and knee pain managed with opioids,  Presents for med refill.    Morbid obesity: Has lost 53 lbs since January, most induce by oral surgery and removal of multiple decaying teeth.  HTN:  Checks BP rarely,  Only if feeling poorly.  Taking meds as directed. No side effects    ROS: See pertinent positives and negatives per HPI.  Past Medical History:  Diagnosis Date  . Arthritis    a. chronic joint pain  . B12 deficiency   . Chronic systolic CHF (congestive heart failure) (HCC)    a. echo 2013: EF of 50-55%, normal right ventricular  systolic pressure, normal left atrium; b. EF 40-45%, inadequate for LV wall motion, mild MR, LA severely dilated @ 52 mm, PASP nl  . History of DVT (deep vein thrombosis) 2004   s/p bariatric surgery  . History of stress test    a. there was no ST segment deviation noted during stress,    There is a small defect of moderate severity present in the apex location, suggestive of apical ischemia, intermediate risk, calculated EF 21% but visually appears to be 35-40%  . Hypertension   . Hypothyroidism   . Neuropathy   . Obesity   . OSA (obstructive sleep apnea)    a. not compliant with CPAP  . PAF (paroxysmal atrial fibrillation) (HCC)    a. CHADSVASc at least 2 (HTN and vascular disease); b. on Eliquis     Past Surgical History:  Procedure Laterality Date  . ARTHROSCOPIC REPAIR ACL Bilateral 1993   bilateral knee x2 on left and x2 on right  . BARIATRIC SURGERY  2004   [place]   . BARIATRIC SURGERY    . CHOLECYSTECTOMY  1982  . ELECTROPHYSIOLOGIC STUDY N/A 06/20/2015   Procedure: CARDIOVERSION;  Surgeon: Antonieta Iba, MD;  Location: [facility] ORS;  Service: Cardiovascular;  Laterality: N/A;  . KNEE ARTHROPLASTY Right 06/20/2017   Procedure: COMPUTER ASSISTED TOTAL KNEE ARTHROPLASTY;  Surgeon: Donato Heinz, MD;  Location: [facility] ORS;  Service: Orthopedics;  Laterality: Right;  . MENISCUS DEBRIDEMENT Left 2011   Left knee  . REPLACEMENT TOTAL KNEE Left 07/2013  . TONSILLECTOMY      Family History  Problem Relation Age of Onset  . Dementia Mother   . Diabetes Father   . Alzheimer's disease Father     SOCIAL HX:  reports that he has never smoked. He has never used smokeless tobacco. He reports current alcohol use of about 2.0 standard drinks of alcohol per week. He reports that he does not use drugs.   Current Outpatient Medications:  .  amiodarone (PACERONE) 200 MG tablet, TAKE 1 TABLET BY MOUTH  TWICE DAILY, Disp: 180 tablet, Rfl: 2 .  B Complex Vitamins (B COMPLEX 50 PO), Take 1  tablet by mouth daily. , Disp: , Rfl:  .  buPROPion (WELLBUTRIN XL) 150 MG 24 hr tablet, TAKE 3 TABLETS BY MOUTH  DAILY, Disp: 270 tablet, Rfl: 3 .  Cholecalciferol (VITAMIN D3) 2000 UNITS TABS, Take 1 tablet by mouth daily. , Disp: , Rfl:  .  dabigatran (PRADAXA) 150 MG CAPS capsule, TAKE 1 CAPSULE BY MOUTH TWICE DAILY, Disp: 180 capsule, Rfl: 2 .  docusate sodium (DULCOLAX) 100 MG capsule, Take 400 mg by mouth daily. , Disp: , Rfl:  .  Ferrous Gluconate 324 (37.5 Fe) MG TABS, Take 1 tablet by mouth daily. , Disp: , Rfl:  .  furosemide (LASIX) 40 MG tablet, Take 1 tablet (40 mg total) by mouth daily as needed., Disp: 90 tablet, Rfl: 3 .  gabapentin (NEURONTIN) 300 MG capsule, Take 1 capsule (300 mg total) by mouth 3 (three) times daily., Disp: 270 capsule, Rfl: 3 .  GLUCOSAMINE HCL-MSM PO, Take 1 tablet by mouth daily. , Disp: , Rfl:  .  HYDROcodone-acetaminophen (NORCO) 10-325 MG tablet, Take 1 tablet by mouth every 8 (eight) hours as needed for severe pain., Disp: 90 tablet, Rfl: 0 .  [START ON 07/25/2020] HYDROcodone-acetaminophen (NORCO) 10-325 MG tablet, Take 1 tablet by mouth every 8 (eight) hours as needed for severe pain., Disp: 90 tablet, Rfl: 0 .  [START ON 08/24/2020] HYDROcodone-acetaminophen (NORCO) 10-325 MG tablet, Take 1 tablet by mouth every 8 (eight) hours as needed., Disp: 90 tablet, Rfl: 0 .  levothyroxine (SYNTHROID) 25 MCG tablet, TAKE 1 TABLET BY MOUTH  DAILY BEFORE BREAKFAST, Disp: 90 tablet, Rfl: 3 .  losartan-hydrochlorothiazide (HYZAAR) 100-25 MG tablet, TAKE 1 TABLET BY MOUTH DAILY, Disp: 90 tablet, Rfl: 1 .  metoprolol succinate (TOPROL-XL) 25 MG 24 hr tablet, TAKE 1 TABLET(25 MG) BY MOUTH DAILY, Disp: 90 tablet, Rfl: 1 .  Multiple Vitamin (MULTIVITAMIN) tablet, Take 1 tablet by mouth daily., Disp: , Rfl:  .  nitroGLYCERIN (NITROSTAT) 0.4 MG SL tablet, Place 1 tablet (0.4 mg total) under the tongue every 5 (five) minutes as needed for chest pain., Disp: 30 tablet, Rfl:  3 .  Potassium Chloride ER 20 MEQ TBCR, Take 20 mEq by mouth 2 (two) times daily as needed., Disp: 180 tablet, Rfl: 3 .  propranolol (INDERAL) 20 MG tablet, Take 20 mg by mouth 3 (three) times daily as needed. Reported on 03/22/2016, Disp: , Rfl:  .  venlafaxine XR (EFFEXOR-XR) 37.5 MG 24 hr capsule, TAKE 1 CAPSULE BY MOUTH  DAILY WITH BREAKFAST, Disp: 90 capsule, Rfl: 1 .  vitamin B-12 (CYANOCOBALAMIN) 500 MCG tablet, Take 500 mcg by mouth daily., Disp: , Rfl:  .  vitamin E 400 UNIT capsule, Take 400 Units by mouth daily., Disp: , Rfl:   EXAM:   General impression: alert, cooperative and articulate.  No signs of being in distress  Lungs: speech is fluent sentence length suggests that patient is not short of  breath and not punctuated by cough, sneezing or sniffing. Marland Kitchen   Psych: affect normal.  speech is articulate and non pressured .  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  She is not using NSAIDs daily.  Discussed goal of 120/70  (130/80 for patients over 70)  to preserve renal function.  She has been asked to check her  BP  at home and  submit readings for evaluation. Renal function, electrolytes and screen for proteinuria are all normal .[id number]  Morbid obesity (HCC) With  Hypertension , untreated sleep apnea,  And DJD . I have congratulated her in reduction of   BMI and encouraged  Continued weight loss with goal of 10% of body weigh over the next 6 months using a low glycemic index diet and regular exercise a minimum of 5 days per week.    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 Marland Kitchen  His Refill history was confirmed via [place] Controlled Substance database by me today.   I have refilled his hydrocodone for  Use not more than 3 times daily for Sept, Oct and November  and advised him to maximize his tylenol use to 2000 mg daily. .  NSAIDs are contraindicated due to history of gastric bypass     I discussed the assessment and treatment plan with the patient. The patient was provided an opportunity to ask questions and all were answered. The patient agreed with the plan and demonstrated an understanding of the instructions.   The patient was advised to call back or seek an in-person evaluation if the symptoms worsen or if the condition fails to improve as anticipated.  I provided  22 minutes of non-face-to-face time during this encounter.   Sherlene Shams, MD

## 2020-06-25 NOTE — Assessment & Plan Note (Signed)
With  Hypertension , untreated sleep apnea,  And DJD . I have congratulated her in reduction of   BMI and encouraged  Continued weight loss with goal of 10% of body weigh over the next 6 months using a low glycemic index diet and regular exercise a minimum of 5 days per week.

## 2020-06-25 NOTE — Assessment & Plan Note (Signed)
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 Marland Kitchen  His Refill history was confirmed via [place] Controlled Substance database by me today.   I have refilled his hydrocodone for  Use not more than 3 times daily for Sept, Oct and November  and advised him to maximize his tylenol use to 2000 mg daily. .  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

## 2020-06-26 ENCOUNTER — Telehealth: Payer: Self-pay | Admitting: Internal Medicine

## 2020-06-26 MED ORDER — HYDROCODONE-ACETAMINOPHEN 10-325 MG PO TABS
1.0000 | ORAL_TABLET | Freq: Three times a day (TID) | ORAL | 0 refills | Status: DC | PRN
Start: 2020-06-26 — End: 2020-08-28

## 2020-06-26 NOTE — Telephone Encounter (Signed)
Patient called in and stated that [facility] did not receive  the refill foHYDROcodone-acetaminophen (NORCO) 10-325 MG tablet HYDROcodone-acetaminophen (NORCO) 10-325 MG tablet HYDROcodone-acetaminophen (NORCO) 10-325 MG tabletr  Wanted to know if you could sent it again

## 2020-06-26 NOTE — Telephone Encounter (Signed)
The rx's for the hydrocodone did not go through to [facility].

## 2020-06-26 NOTE — Telephone Encounter (Signed)
Resent the September one,  mychart message sent

## 2020-06-27 NOTE — Telephone Encounter (Signed)
Patient advised as below.  

## 2020-07-01 ENCOUNTER — Telehealth: Payer: Self-pay | Admitting: Internal Medicine

## 2020-07-01 NOTE — Telephone Encounter (Signed)
Patient dropped off a Wellness form for [facility] to be signed. Patient stated form is due Friday, 07/04/20. Patient would like to be called to pick up form. Patient was not promised that it would be ready by Friday.

## 2020-07-01 NOTE — Telephone Encounter (Signed)
Form is up front in Dr. Melina Schools color folder.

## 2020-07-01 NOTE — Telephone Encounter (Signed)
Can you please locate this form?  It is not in the orange quick sign folder

## 2020-07-01 NOTE — Telephone Encounter (Signed)
Form received and placed in Providers Quick Sign Folder.

## 2020-07-02 NOTE — Telephone Encounter (Signed)
Form located, signed, and placed up front for patient pick up. Patient is notified to pick up form and he states he will on 07/03/20.

## 2020-07-26 ENCOUNTER — Other Ambulatory Visit: Payer: Self-pay | Admitting: Internal Medicine

## 2020-07-28 ENCOUNTER — Telehealth: Payer: Self-pay | Admitting: Internal Medicine

## 2020-07-28 MED ORDER — HYDROCODONE-ACETAMINOPHEN 10-325 MG PO TABS
1.0000 | ORAL_TABLET | Freq: Three times a day (TID) | ORAL | 0 refills | Status: DC | PRN
Start: 2020-07-28 — End: 2020-09-29

## 2020-07-28 NOTE — Telephone Encounter (Signed)
October resent

## 2020-07-28 NOTE — Telephone Encounter (Signed)
Looks like just the rx for September went electronically and the one for October and November failed electronically.

## 2020-07-28 NOTE — Telephone Encounter (Signed)
Patient called and said that the pharmacy only received one refill and needs refills to two more months.

## 2020-08-05 NOTE — Telephone Encounter (Signed)
error 

## 2020-08-28 ENCOUNTER — Telehealth: Payer: Self-pay | Admitting: Internal Medicine

## 2020-08-28 MED ORDER — HYDROCODONE-ACETAMINOPHEN 10-325 MG PO TABS
1.0000 | ORAL_TABLET | Freq: Three times a day (TID) | ORAL | 0 refills | Status: DC | PRN
Start: 2020-09-27 — End: 2020-09-29

## 2020-08-28 MED ORDER — HYDROCODONE-ACETAMINOPHEN 10-325 MG PO TABS
1.0000 | ORAL_TABLET | Freq: Three times a day (TID) | ORAL | 0 refills | Status: DC | PRN
Start: 2020-08-28 — End: 2020-09-29

## 2020-08-28 NOTE — Telephone Encounter (Signed)
Pt wife called [facility] has not received rx for HYDROcodone-acetaminophen (NORCO) 10-325 MG tablet for November

## 2020-08-28 NOTE — Telephone Encounter (Signed)
Pt is aware that rx's have been sent in. 

## 2020-08-28 NOTE — Telephone Encounter (Signed)
Refills for Nov 18 and Dec 18 sent

## 2020-09-21 ENCOUNTER — Other Ambulatory Visit: Payer: Self-pay | Admitting: Internal Medicine

## 2020-09-21 ENCOUNTER — Other Ambulatory Visit: Payer: Self-pay | Admitting: Cardiovascular Disease

## 2020-09-22 NOTE — Telephone Encounter (Signed)
Rx request sent to pharmacy.  

## 2020-09-29 ENCOUNTER — Other Ambulatory Visit: Payer: Self-pay

## 2020-09-29 ENCOUNTER — Ambulatory Visit: Payer: BC Managed Care – PPO | Admitting: Internal Medicine

## 2020-09-29 ENCOUNTER — Encounter: Payer: Self-pay | Admitting: Internal Medicine

## 2020-09-29 VITALS — BP 146/84 | HR 64 | Temp 99.3°F | Ht 75.0 in | Wt 360.8 lb

## 2020-09-29 DIAGNOSIS — E039 Hypothyroidism, unspecified: Secondary | ICD-10-CM

## 2020-09-29 DIAGNOSIS — M2550 Pain in unspecified joint: Secondary | ICD-10-CM | POA: Diagnosis not present

## 2020-09-29 DIAGNOSIS — D6869 Other thrombophilia: Secondary | ICD-10-CM

## 2020-09-29 DIAGNOSIS — I4819 Other persistent atrial fibrillation: Secondary | ICD-10-CM

## 2020-09-29 DIAGNOSIS — G4733 Obstructive sleep apnea (adult) (pediatric): Secondary | ICD-10-CM

## 2020-09-29 DIAGNOSIS — I480 Paroxysmal atrial fibrillation: Secondary | ICD-10-CM

## 2020-09-29 DIAGNOSIS — I519 Heart disease, unspecified: Secondary | ICD-10-CM

## 2020-09-29 DIAGNOSIS — Z79899 Other long term (current) drug therapy: Secondary | ICD-10-CM

## 2020-09-29 DIAGNOSIS — E781 Pure hyperglyceridemia: Secondary | ICD-10-CM | POA: Diagnosis not present

## 2020-09-29 DIAGNOSIS — R296 Repeated falls: Secondary | ICD-10-CM

## 2020-09-29 DIAGNOSIS — F331 Major depressive disorder, recurrent, moderate: Secondary | ICD-10-CM

## 2020-09-29 MED ORDER — HYDROCODONE-ACETAMINOPHEN 10-325 MG PO TABS: 1.0000 | ORAL_TABLET | Freq: Three times a day (TID) | ORAL | 0 refills | Status: DC | PRN

## 2020-09-29 MED ORDER — HYDROCODONE-ACETAMINOPHEN 10-325 MG PO TABS
1.0000 | ORAL_TABLET | Freq: Three times a day (TID) | ORAL | 0 refills | Status: DC | PRN
Start: 2020-09-29 — End: 2021-01-05

## 2020-09-29 NOTE — Progress Notes (Signed)
Subjective:  Patient ID: Johnathan [name], male    DOB: 1956/07/16  Age: 64 y.o. MRN: [id number]  CC: The primary encounter diagnosis was Long-term use of high-risk medication. Diagnoses of Acquired hypothyroidism, Pure hypertriglyceridemia, Polyarthralgia, PAF (paroxysmal atrial fibrillation) (HCC), Persistent atrial fibrillation (HCC), Acquired thrombophilia (HCC), Frequent falls, OSA (obstructive sleep apnea), Systolic dysfunction, and Moderate episode of recurrent major depressive disorder (HCC) were also pertinent to this visit.  HPI Johnathan [name] presents for medication refill and follow up on chronic issues   This visit occurred during the SARS-CoV-2 public health emergency.  Safety protocols were in place, including screening questions prior to the visit, additional usage of staff PPE, and extensive cleaning of exam room while observing appropriate contact time as indicated for disinfecting solutions.   Obesity:  He has >18 lbs since his last visit in September,  After losing 40 + lbs due to having to follow a soft diet for several months after  all of his teeth were pulled.  He is not actively trying to lose weight.  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 He is Using vicodin every 8 to 12 hours,  Last refill Nov 18 Refill per database.   Frequent falls:   His most recent fall reviewed:  Larey Seat ("hard")over a root in the yard last week ,  Twisted and landed on right side.  Right shoulder still painful,  (has a history of rotator cuff damage)  Right hip was painful but now both are improving. No bruising was noted  At time of injury.  He localizes the pain in the right shoulder to the  [facility] joint and to the  base of neck posteriorly at the insertion of the right trapezius muscle.  He notes . Pain with flexion of shoulder above horizontal plane. He also notes that he cannot lift the arm  in a forward flexed position if he has any weight in the hand .  25% better . He declines referral to ortho     Depression :  Symptoms have been worse this year  takes wellbutrin  450 mg daily  Has added an occasional extra dose. Thinks he has SAD.  Spending time outside,  Has brightened the bulbs in house. . Takes granddaughter to school daily,  Walks in [place] for exercise but only two times per week     Concerned that wife Olegario Messier has PD>  Dropping things a lot, some intention tremor of hand .  And her father had PD.  She is seeing  A surgeon on Thursday.  Outpatient Medications Prior to Visit  Medication Sig Dispense Refill  . amiodarone (PACERONE) 200 MG tablet Take 1 tablet (200 mg total) by mouth 2 (two) times daily. 180 tablet 1  . B Complex Vitamins (B COMPLEX 50 PO) Take 1 tablet by mouth daily.     Marland Kitchen buPROPion (WELLBUTRIN XL) 150 MG 24 hr tablet TAKE 3 TABLETS BY MOUTH  DAILY 270 tablet 3  . Cholecalciferol (VITAMIN D3) 2000 UNITS TABS Take 1 tablet by mouth daily.     . dabigatran (PRADAXA) 150 MG CAPS capsule TAKE 1 CAPSULE BY MOUTH TWICE DAILY 180 capsule 2  . docusate sodium (COLACE) 100 MG capsule Take 400 mg by mouth daily.     . Ferrous Gluconate 324 (37.5 Fe) MG TABS Take 1 tablet by mouth daily.     . furosemide (  LASIX) 40 MG tablet Take 1 tablet (40 mg total) by mouth daily as needed. 90 tablet 3  . gabapentin (NEURONTIN) 300 MG capsule Take 1 capsule (300 mg total) by mouth 3 (three) times daily. 270 capsule 3  . GLUCOSAMINE HCL-MSM PO Take 1 tablet by mouth daily.     Marland Kitchen levothyroxine (SYNTHROID) 25 MCG tablet TAKE 1 TABLET BY MOUTH  DAILY BEFORE BREAKFAST 90 tablet 3  . losartan-hydrochlorothiazide (HYZAAR) 100-25 MG tablet TAKE 1 TABLET BY MOUTH DAILY 90 tablet 1  . metoprolol succinate (TOPROL-XL) 25 MG 24 hr tablet TAKE 1 TABLET(25 MG) BY MOUTH DAILY 90 tablet 1  . Multiple Vitamin (MULTIVITAMIN) tablet Take 1 tablet by mouth daily.    . nitroGLYCERIN (NITROSTAT) 0.4 MG  SL tablet Place 1 tablet (0.4 mg total) under the tongue every 5 (five) minutes as needed for chest pain. 30 tablet 3  . Potassium Chloride ER 20 MEQ TBCR Take 20 mEq by mouth 2 (two) times daily as needed. 180 tablet 3  . propranolol (INDERAL) 20 MG tablet Take 20 mg by mouth 3 (three) times daily as needed. Reported on 03/22/2016    . venlafaxine XR (EFFEXOR-XR) 37.5 MG 24 hr capsule TAKE 1 CAPSULE BY MOUTH  DAILY WITH BREAKFAST 90 capsule 1  . vitamin B-12 (CYANOCOBALAMIN) 500 MCG tablet Take 500 mcg by mouth daily.    . vitamin E 400 UNIT capsule Take 400 Units by mouth daily.    Marland Kitchen HYDROcodone-acetaminophen (NORCO) 10-325 MG tablet Take 1 tablet by mouth every 8 (eight) hours as needed for severe pain. 90 tablet 0  . HYDROcodone-acetaminophen (NORCO) 10-325 MG tablet Take 1 tablet by mouth every 8 (eight) hours as needed. 90 tablet 0  . HYDROcodone-acetaminophen (NORCO) 10-325 MG tablet Take 1 tablet by mouth every 8 (eight) hours as needed for severe pain. 90 tablet 0   No facility-administered medications prior to visit.    Review of Systems;  Patient denies headache, fevers, malaise, unintentional weight loss, skin rash, eye pain, sinus congestion and sinus pain, sore throat, dysphagia,  hemoptysis , cough, dyspnea, wheezing, chest pain, palpitations, orthopnea, edema, abdominal pain, nausea, melena, diarrhea, constipation, flank pain, dysuria, hematuria, urinary  Frequency, nocturia, numbness, tingling, seizures,  Focal weakness, Loss of consciousness,  Tremor, insomnia, depression, anxiety, and suicidal ideation.      Objective:  BP (!) 146/84 (BP Location: Left Arm, Patient Position: Sitting)   Pulse 64   Temp 99.3 F (37.4 C)   Ht 6\' 3"  (1.905 m)   Wt (!) 360 lb 12.8 oz (163.7 kg)   SpO2 93%   BMI 45.10 kg/m   BP Readings from Last 3 Encounters:  09/29/20 (!) 146/84  06/25/20 (!) 149/88  05/29/20 130/88    Wt Readings from Last 3 Encounters:  09/29/20 (!) 360 lb 12.8 oz  (163.7 kg)  06/25/20 (!) 342 lb (155.1 kg)  05/29/20 (!) 349 lb 12.8 oz (158.7 kg)    General appearance: alert, cooperative and appears stated age Neck: no adenopathy, no carotid bruit, supple, symmetrical, trachea midline and thyroid not enlarged, symmetric, no tenderness/mass/nodules Back: symmetric, no curvature. ROM normal. No CVA tenderness. Lungs: clear to auscultation bilaterally Heart: irreg irreg rhythm, S1, S2 normal, no murmur, click, rub or gallop Abdomen: protuberant/obese,  soft, non-tender; bowel sounds normal; no masses,  no organomegaly Pulses: 2+ and symmetric Skin: Skin color, texture c/w venous insufficiency.  No rashes or lesions Ext: right shoulder ROM limited to horizontal plane in flexion and  abduction secondary to pain.   Lymph nodes: Cervical, supraclavicular, and axillary nodes normal. Psych: affect normal, makes good eye contact. No fidgeting,  Speech articulate.  Denies suicidal thoughts    Lab Results  Component Value Date   HGBA1C 5.6 05/29/2020   HGBA1C 5.6 03/29/2018   HGBA1C 5.0 03/29/2016    Lab Results  Component Value Date   CREATININE 1.07 09/29/2020   CREATININE 1.09 03/07/2020   CREATININE 0.94 09/04/2019    Lab Results  Component Value Date   WBC 7.3 09/29/2020   HGB 15.2 09/29/2020   HCT 44.9 09/29/2020   PLT 160 09/29/2020   GLUCOSE 84 09/29/2020   CHOL 163 09/29/2020   TRIG 94 09/29/2020   HDL 45 09/29/2020   LDLCALC 101 (H) 09/29/2020   ALT 21 09/29/2020   AST 17 09/29/2020   NA 140 09/29/2020   K 4.3 09/29/2020   CL 103 09/29/2020   CREATININE 1.07 09/29/2020   BUN 27 09/29/2020   CO2 21 09/29/2020   TSH 3.780 09/29/2020   INR 1.21 06/09/2017   HGBA1C 5.6 05/29/2020    No results found.  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 Marland Kitchen  His Refill history was confirmed via [place] Controlled Substance database by me today.   I have refilled his hydrocodone for  Use not more than 3 times daily for Dec, Jan and Feb and advised him to maximize his tylenol use to 2000 mg daily. .  NSAIDs are contraindicated due to history of gastric bypass       OSA (obstructive sleep apnea)    Untreated for years due to mask intolerance.  I have discussed the long term complications of untreated OSA, andrecommended repeat sleep study and use of CPAP .  He has continued to decline treatment      Systolic dysfunction    EF has not been evaluated since 2016.  Continue regular cardiology follow up.  Daily loop diuretic , rate control with amiodarone and metoprolol       Persistent atrial fibrillation (HCC)    Rate controlled based on patient's monitoring with amiodarone and metoprolol . Continue Pradaxa for stroke prevention        Long-term use of high-risk medication - Primary   Relevant Orders   CBC with Differential/Platelet (Completed)   Hyperlipidemia   Relevant Orders   Lipid panel (Completed)   Comprehensive metabolic panel (Completed)   Acquired hypothyroidism   Relevant Orders   TSH (Completed)   Major depressive disorder with current active episode    He is on maximal dose of Wellbutrin and declines additive therapy.  He attributes current symptoms to "SAD."  Recommend daily natural sunlight exposure by walking the [facility] parking lot with a cart rather than indoors.       Frequent falls    Again Recommended PT evaluation for work in strengthening proximal muscle strength but he declines at this time. Recommend daily walks at [facility] parking lot with cart to address weakness and SAD      Acquired thrombophilia (HCC)    Managed with Pradaxa  for embolic stroke risk mitigation due to  atrial fibrillation. Patient has no signs of bleeding and is advised to notify  her specialists prior to  any procedure that may required suspension of Pradaxa  Lab Results  Component Value Date   WBC 7.3 09/29/2020   HGB 15.2 09/29/2020   HCT 44.9 09/29/2020   MCV 92 09/29/2020   PLT 160 09/29/2020         RESOLVED: PAF (paroxysmal atrial fibrillation) (HCC)    Rate controlled based on patient's monitoring with amiodarone and metoprolol          I am having Johnathan [name] maintain his Ferrous Gluconate, multivitamin, B Complex Vitamins (B COMPLEX 50 PO), vitamin B-12, Vitamin D3, GLUCOSAMINE HCL-MSM PO, vitamin E, nitroGLYCERIN, Potassium Chloride ER, propranolol, docusate sodium, furosemide, gabapentin, dabigatran, losartan-hydrochlorothiazide, venlafaxine XR, metoprolol succinate, levothyroxine, buPROPion, amiodarone, HYDROcodone-acetaminophen, HYDROcodone-acetaminophen, and HYDROcodone-acetaminophen.  Meds ordered this encounter  Medications  . HYDROcodone-acetaminophen (NORCO) 10-325 MG tablet    Sig: Take 1 tablet by mouth every 8 (eight) hours as needed for severe pain.    Dispense:  90 tablet    Refill:  0  . HYDROcodone-acetaminophen (NORCO) 10-325 MG tablet    Sig: Take 1 tablet by mouth every 8 (eight) hours as needed.    Dispense:  90 tablet    Refill:  0  . HYDROcodone-acetaminophen (NORCO) 10-325 MG tablet    Sig: Take 1 tablet by mouth every 8 (eight) hours as needed for severe pain.    Dispense:  90 tablet    Refill:  0   40 minutes  Was spent  reviewing patient's current problems and previous encounters with his specialists, reviewing and ordering  labs and imaging studies, providing counseling on the above mentioned problems , and evaluating patient  In a face to face visit  .  Medications Discontinued During This Encounter  Medication Reason  . HYDROcodone-acetaminophen (NORCO) 10-325 MG tablet Reorder  . HYDROcodone-acetaminophen (NORCO) 10-325 MG tablet Reorder  . HYDROcodone-acetaminophen (NORCO) 10-325 MG tablet Reorder    Follow-up: No follow-ups  on file.   Sherlene Shamseresa L Katrine Radich, MD

## 2020-09-29 NOTE — Patient Instructions (Signed)
You are on the maximal effective (and safe) dose of Wellbutrin (450 mg daily)  We can add a 2nd medication if needed to control your SAD   For now, I recommend:  A daily walk through the [employer] parking lot using a cart    The goals for optimal blood pressure management is 130/80.  Please check your blood pressure a few times at home and send me the readings so I can determine if you need a change in medication

## 2020-09-30 LAB — COMPREHENSIVE METABOLIC PANEL
ALT: 21 IU/L (ref 0–44)
AST: 17 IU/L (ref 0–40)
Albumin/Globulin Ratio: 1.4 (ref 1.2–2.2)
Albumin: 4.2 g/dL (ref 3.8–4.8)
Alkaline Phosphatase: 104 IU/L (ref 44–121)
BUN/Creatinine Ratio: 25 — ABNORMAL HIGH (ref 10–24)
BUN: 27 mg/dL (ref 8–27)
Bilirubin Total: 0.4 mg/dL (ref 0.0–1.2)
CO2: 21 mmol/L (ref 20–29)
Calcium: 8.9 mg/dL (ref 8.6–10.2)
Chloride: 103 mmol/L (ref 96–106)
Creatinine, Ser: 1.07 mg/dL (ref 0.76–1.27)
GFR calc Af Amer: 84 mL/min/{1.73_m2} (ref >59)
GFR calc non Af Amer: 72 mL/min/{1.73_m2} (ref >59)
Globulin, Total: 2.9 g/dL (ref 1.5–4.5)
Glucose: 84 mg/dL (ref 65–99)
Potassium: 4.3 mmol/L (ref 3.5–5.2)
Sodium: 140 mmol/L (ref 134–144)
Total Protein: 7.1 g/dL (ref 6.0–8.5)

## 2020-09-30 LAB — CBC WITH DIFFERENTIAL/PLATELET
Basophils Absolute: 0.1 10*3/uL (ref 0.0–0.2)
Basos: 1 %
EOS (ABSOLUTE): 0.2 10*3/uL (ref 0.0–0.4)
Eos: 2 %
Hematocrit: 44.9 % (ref 37.5–51.0)
Hemoglobin: 15.2 g/dL (ref 13.0–17.7)
Immature Grans (Abs): 0 10*3/uL (ref 0.0–0.1)
Immature Granulocytes: 0 %
Lymphocytes Absolute: 2.2 10*3/uL (ref 0.7–3.1)
Lymphs: 30 %
MCH: 31.1 pg (ref 26.6–33.0)
MCHC: 33.9 g/dL (ref 31.5–35.7)
MCV: 92 fL (ref 79–97)
Monocytes Absolute: 0.8 10*3/uL (ref 0.1–0.9)
Monocytes: 11 %
Neutrophils Absolute: 4.1 10*3/uL (ref 1.4–7.0)
Neutrophils: 56 %
Platelets: 160 10*3/uL (ref 150–450)
RBC: 4.89 x10E6/uL (ref 4.14–5.80)
RDW: 12.8 % (ref 11.6–15.4)
WBC: 7.3 10*3/uL (ref 3.4–10.8)

## 2020-09-30 LAB — LIPID PANEL
Chol/HDL Ratio: 3.6 ratio (ref 0.0–5.0)
Cholesterol, Total: 163 mg/dL (ref 100–199)
HDL: 45 mg/dL (ref >39)
LDL Chol Calc (NIH): 101 mg/dL — ABNORMAL HIGH (ref 0–99)
Triglycerides: 94 mg/dL (ref 0–149)
VLDL Cholesterol Cal: 17 mg/dL (ref 5–40)

## 2020-09-30 LAB — TSH: TSH: 3.78 u[IU]/mL (ref 0.450–4.500)

## 2020-10-02 ENCOUNTER — Encounter: Payer: Self-pay | Admitting: Internal Medicine

## 2020-10-02 DIAGNOSIS — D6869 Other thrombophilia: Secondary | ICD-10-CM | POA: Insufficient documentation

## 2020-10-02 NOTE — Assessment & Plan Note (Signed)
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 Marland Kitchen  His Refill history was confirmed via [place] Controlled Substance database by me today.   I have refilled his hydrocodone for  Use not more than 3 times daily for Dec, Jan and Feb and advised him to maximize his tylenol use to 2000 mg daily. .  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

## 2020-10-02 NOTE — Assessment & Plan Note (Signed)
EF has not been evaluated since 2016.  Continue regular cardiology follow up.  Daily loop diuretic , rate control with amiodarone and metoprolol

## 2020-10-02 NOTE — Assessment & Plan Note (Signed)
He is on maximal dose of Wellbutrin and declines additive therapy.  He attributes current symptoms to "SAD."  Recommend daily natural sunlight exposure by walking the [facility] parking lot with a cart rather than indoors.

## 2020-10-02 NOTE — Assessment & Plan Note (Signed)
Rate controlled based on patient's monitoring with amiodarone and metoprolol

## 2020-10-02 NOTE — Progress Notes (Signed)
Your CBC, thyroid ,cholesterol,  liver and kidney function are normal.     Please Plan to repeat fasting labs in 6 months    Regards,  Dr. Darrick Huntsman

## 2020-10-02 NOTE — Assessment & Plan Note (Signed)
Rate controlled based on patient's monitoring with amiodarone and metoprolol . Continue Pradaxa for stroke prevention

## 2020-10-02 NOTE — Assessment & Plan Note (Signed)
Managed with Pradaxa  for embolic stroke risk mitigation due to  atrial fibrillation. Patient has no signs of bleeding and is advised to notify her specialists prior to any procedure that may required suspension of Pradaxa  Lab Results  Component Value Date   WBC 7.3 09/29/2020   HGB 15.2 09/29/2020   HCT 44.9 09/29/2020   MCV 92 09/29/2020   PLT 160 09/29/2020

## 2020-10-02 NOTE — Assessment & Plan Note (Signed)
Again Recommended PT evaluation for work in strengthening proximal muscle strength but he declines at this time. Recommend daily walks at [facility] parking lot with cart to address weakness and SAD

## 2020-10-02 NOTE — Assessment & Plan Note (Addendum)
Untreated for years due to mask intolerance.  I have discussed the long term complications of untreated OSA, andrecommended repeat sleep study and use of CPAP .  He has continued to decline treatment

## 2020-10-06 ENCOUNTER — Telehealth: Payer: Self-pay | Admitting: Cardiovascular Disease

## 2020-10-06 DIAGNOSIS — I10 Essential (primary) hypertension: Secondary | ICD-10-CM

## 2020-10-06 MED ORDER — LOSARTAN POTASSIUM-HCTZ 100-25 MG PO TABS: 1.0000 | ORAL_TABLET | Freq: Every day | ORAL | 1 refills | Status: DC

## 2020-10-06 NOTE — Telephone Encounter (Signed)
Rx request sent to pharmacy.  

## 2020-10-06 NOTE — Telephone Encounter (Signed)
°*  STAT* If patient is at the pharmacy, call can be transferred to refill team.   1. Which medications need to be refilled? (please list name of each medication and dose if known) losartan - hydrochlorothiazide 100-25 MG 1 tablet daily   2. Which pharmacy/location (including street and city if local pharmacy) is medication to be sent to? [facility] in [place]  3. Do they need a 30 day or 90 day supply? 90 day

## 2020-10-08 ENCOUNTER — Other Ambulatory Visit: Payer: Self-pay

## 2020-10-08 DIAGNOSIS — I10 Essential (primary) hypertension: Secondary | ICD-10-CM

## 2020-10-08 MED ORDER — LOSARTAN POTASSIUM-HCTZ 100-25 MG PO TABS: 1.0000 | ORAL_TABLET | Freq: Every day | ORAL | 0 refills | Status: DC

## 2020-10-08 NOTE — Telephone Encounter (Signed)
*  STAT* If patient is at the pharmacy, call can be transferred to refill team.   1. Which medications need to be refilled? (please list name of each medication and dose if known) Losartan  2. Which pharmacy/location (including street and city if local pharmacy) is medication to be sent to? [facility]  3. Do they need a 30 day or 90 day supply? 90  Spouse states [facility] cannot get medication until January 28

## 2020-10-13 NOTE — Telephone Encounter (Signed)
Patient wife returning call. Please call asap .

## 2020-10-13 NOTE — Telephone Encounter (Signed)
Left message for wife to call back regarding pt's losartan medication refill

## 2020-10-13 NOTE — Telephone Encounter (Signed)
Patient would like an alternate prescription for Losartan (Hyzaar) . States the pharmacies at the present are not shipping this medication any more. Please call to discuss. Patient would like alternate prescription sent to [facility] in [place].

## 2020-10-13 NOTE — Telephone Encounter (Signed)
Was able to reach out to [facility] in [place], they were able to get pt's Hyzaar 100-25mg  on their truck today, they are filling his medication and it will be ready this afternoon. Called Johnathan [name] on home number to let him know his medication will be ready for pick up this afternoon. Pt is delighted of news and will pick up his medication, will call back with other concerns.

## 2020-10-13 NOTE — Telephone Encounter (Signed)
Patient wife calling to discuss refill .  Pharmacy does not have it due to losartan back order.   Please call to discuss what can be substituted . Patient is out.

## 2020-10-24 ENCOUNTER — Telehealth: Payer: Self-pay | Admitting: Cardiovascular Disease

## 2020-10-24 MED ORDER — DABIGATRAN ETEXILATE MESYLATE 150 MG PO CAPS: ORAL_CAPSULE | ORAL | 1 refills | Status: DC

## 2020-10-24 NOTE — Telephone Encounter (Signed)
Pt's age 65, wt 163.7 kg, SCr 1.07, CrCl 159.37, last ov w/ TG 11/05/19, next appt 10/24/20. "Tolerating Pradaxa previously had trouble on Xarelto and Eliquis.  He does have significant bleeding if he cuts himself"

## 2020-10-24 NOTE — Telephone Encounter (Signed)
*  STAT* If patient is at the pharmacy, call can be transferred to refill team.   1. Which medications need to be refilled? (please list name of each medication and dose if known) Pradaxa 150 MG 1 capsule twice daily   2. Which pharmacy/location (including street and city if local pharmacy) is medication to be sent to? [facility] in [place]   3. Do they need a 30 day or 90 day supply? 90 day   Patient is out of medication

## 2020-10-24 NOTE — Telephone Encounter (Signed)
Refill Request.  

## 2020-10-30 ENCOUNTER — Other Ambulatory Visit: Payer: Self-pay | Admitting: Internal Medicine

## 2020-12-22 ENCOUNTER — Other Ambulatory Visit: Payer: Self-pay | Admitting: *Deleted

## 2020-12-22 DIAGNOSIS — I10 Essential (primary) hypertension: Secondary | ICD-10-CM

## 2020-12-23 NOTE — Telephone Encounter (Signed)
Called patient.  Made him aware that we got a fax from [facility] stated his Hyzaar 100MG -25MG  was out of stock.  He stated that [facility] had it in stock and he did pick up a prescription there.   He stated he is current good on his medication at this time.

## 2020-12-24 NOTE — Progress Notes (Deleted)
Cardiology Office Note  Date:  12/24/2020   ID:  Johnathan [name], DOB 01/13/1956, MRN [id number]  PCP:  Sherlene Shams, MD   No chief complaint on file.   HPI:  [name] is a pleasant 65 year old gentleman with  Morbid obesity chronic joint pain of the shoulders and knees on long-term low-dose narcotics,  hypertension,   bariatric surgery,  atrial fibrillation in August 2013,   normal ejection fraction at that time  sleep apnea, currently not on his CPAP who presents for follow-up of his atrial fibrillation.   2 months ago, Working outside, may have been dehydrated Putting siding on  house [place] backwards after he stood up too quickly, got dizzy No recurrent symptoms   chronic bilateral knee pain, back pain joint pain Unable to sit for prolonged periods of time  total knee replacement 2019  Rarely takes lasix  Echo 2016 reviewed with him - Left ventricle: The cavity size was normal. There was mild   concentric hypertrophy. Systolic function was mildly to   moderately reduced. The estimated ejection fraction was in the   range of 40% to 45%. Images were inadequate for LV wall motion   assessment. - Mitral valve: There was mild regurgitation. - Left atrium: The atrium was severely dilated. - Pulmonary arteries: Systolic pressure was within the normal   range.  Weight continues to trend upwards Denies any tachycardia or palpitations concerning for atrial fibrillation Shortness of breath stable  Tolerating Pradaxa previously had trouble on Xarelto and Eliquis.  He does have significant bleeding if he cuts himself  EKG personally reviewed by myself on todays visit Shows sinus bradycardia rate 56 bpm PVCs, no significant ST or T wave changes noted   Other past medical history  previous cardioversion,successful in restoring normal sinus rhythm   tick bite in June 2016, he developed atrial fibrillation initially with RVR. Started on digoxin, continued on  metoprolol and diltiazem with improved heart rate. Echocardiogram and stress test at that time Cardiac exam shows slightly depressed ejection fraction, dilated left atrium, mild LVH. Ejection fraction estimated at 40-45%. Stress test with small region of apical perfusion defect, depressed ejection fraction.  Previously reported having tachycardia when he feels hot, improved when he cools down Previously had significant shoulder pain Prior clinic visit with shortness of breath that improved with Lasix  In the hospital 05/31/2012 with left-sided chest pain and palpitations. He was given adenosine in the emergency room that showed atrial fibrillation. He received Cardizem and started on diltiazem infusion and converted to normal sinus rhythm overnight. He was started on anticoagulation, continued on long-acting Cardizem and was discharged after echocardiogram showed normal LV function.  Echocardiogram showed normal LV function with ejection fraction 50-55%, normal right ventricular systolic pressure, normal left atrium TSH 1.7  PMH:   has a past medical history of Arthritis, B12 deficiency, Chronic systolic CHF (congestive heart failure) (HCC), Erythema migrans (Lyme disease) (04/08/2015), History of DVT (deep vein thrombosis) (2004), History of stress test, Hypertension, Hypothyroidism, Neuropathy, Obesity, OSA (obstructive sleep apnea), and PAF (paroxysmal atrial fibrillation) (HCC).  PSH:    Past Surgical History:  Procedure Laterality Date  . ARTHROSCOPIC REPAIR ACL Bilateral 1993   bilateral knee x2 on left and x2 on right  . BARIATRIC SURGERY  2004   [place]   . BARIATRIC SURGERY    . CHOLECYSTECTOMY  1982  . ELECTROPHYSIOLOGIC STUDY N/A 06/20/2015   Procedure: CARDIOVERSION;  Surgeon: Antonieta Iba, MD;  Location: [facility]  ORS;  Service: Cardiovascular;  Laterality: N/A;  . KNEE ARTHROPLASTY Right 06/20/2017   Procedure: COMPUTER ASSISTED TOTAL KNEE ARTHROPLASTY;  Surgeon: Donato Heinz, MD;  Location: [facility] ORS;  Service: Orthopedics;  Laterality: Right;  . MENISCUS DEBRIDEMENT Left 2011   Left knee  . REPLACEMENT TOTAL KNEE Left 07/2013  . TONSILLECTOMY     Current Outpatient Medications on File Prior to Visit  Medication Sig Dispense Refill  . amiodarone (PACERONE) 200 MG tablet Take 1 tablet (200 mg total) by mouth 2 (two) times daily. 180 tablet 1  . B Complex Vitamins (B COMPLEX 50 PO) Take 1 tablet by mouth daily.     Marland Kitchen buPROPion (WELLBUTRIN XL) 150 MG 24 hr tablet TAKE 3 TABLETS BY MOUTH  DAILY 270 tablet 3  . Cholecalciferol (VITAMIN D3) 2000 UNITS TABS Take 1 tablet by mouth daily.     . dabigatran (PRADAXA) 150 MG CAPS capsule TAKE 1 CAPSULE BY MOUTH TWICE DAILY 180 capsule 1  . docusate sodium (COLACE) 100 MG capsule Take 400 mg by mouth daily.     . Ferrous Gluconate 324 (37.5 Fe) MG TABS Take 1 tablet by mouth daily.     . furosemide (LASIX) 40 MG tablet Take 1 tablet (40 mg total) by mouth daily as needed. 90 tablet 3  . gabapentin (NEURONTIN) 300 MG capsule Take 1 capsule (300 mg total) by mouth 3 (three) times daily. 270 capsule 3  . GLUCOSAMINE HCL-MSM PO Take 1 tablet by mouth daily.     Marland Kitchen HYDROcodone-acetaminophen (NORCO) 10-325 MG tablet Take 1 tablet by mouth every 8 (eight) hours as needed for severe pain. 90 tablet 0  . HYDROcodone-acetaminophen (NORCO) 10-325 MG tablet Take 1 tablet by mouth every 8 (eight) hours as needed. 90 tablet 0  . HYDROcodone-acetaminophen (NORCO) 10-325 MG tablet Take 1 tablet by mouth every 8 (eight) hours as needed for severe pain. 90 tablet 0  . levothyroxine (SYNTHROID) 25 MCG tablet TAKE 1 TABLET BY MOUTH  DAILY BEFORE BREAKFAST 90 tablet 3  . losartan-hydrochlorothiazide (HYZAAR) 100-25 MG tablet Take 1 tablet by mouth daily. 90 tablet 0  . metoprolol succinate (TOPROL-XL) 25 MG 24 hr tablet TAKE 1 TABLET(25 MG) BY MOUTH DAILY 90 tablet 1  . Multiple Vitamin (MULTIVITAMIN) tablet Take 1 tablet by mouth daily.     . nitroGLYCERIN (NITROSTAT) 0.4 MG SL tablet Place 1 tablet (0.4 mg total) under the tongue every 5 (five) minutes as needed for chest pain. 30 tablet 3  . Potassium Chloride ER 20 MEQ TBCR Take 20 mEq by mouth 2 (two) times daily as needed. 180 tablet 3  . propranolol (INDERAL) 20 MG tablet Take 20 mg by mouth 3 (three) times daily as needed. Reported on 03/22/2016    . venlafaxine XR (EFFEXOR-XR) 37.5 MG 24 hr capsule TAKE 1 CAPSULE BY MOUTH  DAILY WITH BREAKFAST 90 capsule 1  . vitamin B-12 (CYANOCOBALAMIN) 500 MCG tablet Take 500 mcg by mouth daily.    . vitamin E 400 UNIT capsule Take 400 Units by mouth daily.     No current facility-administered medications on file prior to visit.     Allergies:   Patient has no known allergies.   Social History:  The patient  reports that he has never smoked. He has never used smokeless tobacco. He reports current alcohol use of about 2.0 standard drinks of alcohol per week. He reports that he does not use drugs.   Family History:   family  history includes Alzheimer's disease in his father; Dementia in his mother; Diabetes in his father.    Review of Systems: Review of Systems  Constitutional: Negative.        Weight gain  Respiratory: Negative.   Cardiovascular: Negative.   Gastrointestinal: Negative.   Musculoskeletal: Positive for joint pain.  Neurological: Negative.   Psychiatric/Behavioral: Negative.   All other systems reviewed and are negative.    PHYSICAL EXAM: VS:  There were no vitals taken for this visit. , BMI There is no height or weight on file to calculate BMI. Constitutional:  oriented to person, place, and time. No distress.  Obese HENT:  Head: Grossly normal Eyes:  no discharge. No scleral icterus.  Neck: No JVD, no carotid bruits  Cardiovascular: Regular rate and rhythm, no murmurs appreciated 1+ pitting edema lower extremities right greater than left Pulmonary/Chest: Clear to auscultation bilaterally, no wheezes or  rails Abdominal: Soft.  no distension.  no tenderness.  Musculoskeletal: Normal range of motion Neurological:  normal muscle tone. Coordination normal. No atrophy Skin: Skin warm and dry Psychiatric: normal affect, pleasant  Recent Labs: 09/29/2020: ALT 21; BUN 27; Creatinine, Ser 1.07; Hemoglobin 15.2; Platelets 160; Potassium 4.3; Sodium 140; TSH 3.780    Lipid Panel Lab Results  Component Value Date   CHOL 163 09/29/2020   HDL 45 09/29/2020   LDLCALC 101 (H) 09/29/2020   TRIG 94 09/29/2020      Wt Readings from Last 3 Encounters:  09/29/20 (!) 360 lb 12.8 oz (163.7 kg)  06/25/20 (!) 342 lb (155.1 kg)  05/29/20 (!) 349 lb 12.8 oz (158.7 kg)     ASSESSMENT AND PLAN:  Atrial fibrillation, unspecified type (HCC) - Plan: EKG 12-Lead Maintaining normal sinus rhythm On amiodarone. pradaxa He previously declined decreasing amiodarone dosing from twice daily down to daily Discussed with him again that he could decrease amiodarone down to once a day TSH 3.5, normal LFTs  Essential hypertension - Plan: EKG 12-Lead Blood pressure is mildly elevated, likely from long walk into the office  no changes made to the medications.   Acute on chronic diastolic CHF (congestive heart failure) (HCC) Appears euvolemic.  Does not take much Lasix No changes to medications Chronic stable leg swelling, right greater than left likely exacerbated by prior DVT on the right  Severe obesity (BMI >= 40) (HCC) Unable to exercise secondary to chronic knee pain  Eating more, and active Recommended walking program as tolerated, low carbohydrate diet   Total encounter time more than 25 minutes  Greater than 50% was spent in counseling and coordination of care with the patient  Disposition:   F/U  12 months   No orders of the defined types were placed in this encounter.    Signed, Dossie Arbour, M.D., Ph.D. 12/24/2020  [facility] [place], [place] [phone]

## 2020-12-26 ENCOUNTER — Ambulatory Visit: Payer: BC Managed Care – PPO | Admitting: Cardiovascular Disease

## 2020-12-26 ENCOUNTER — Other Ambulatory Visit: Payer: Self-pay | Admitting: *Deleted

## 2020-12-26 DIAGNOSIS — Z86718 Personal history of other venous thrombosis and embolism: Secondary | ICD-10-CM

## 2020-12-26 DIAGNOSIS — I10 Essential (primary) hypertension: Secondary | ICD-10-CM

## 2020-12-26 DIAGNOSIS — E7849 Other hyperlipidemia: Secondary | ICD-10-CM

## 2020-12-26 DIAGNOSIS — I5043 Acute on chronic combined systolic (congestive) and diastolic (congestive) heart failure: Secondary | ICD-10-CM

## 2020-12-26 DIAGNOSIS — I4819 Other persistent atrial fibrillation: Secondary | ICD-10-CM

## 2020-12-26 NOTE — Telephone Encounter (Signed)
Sent as telephone encounter to provider for review and recommendations.

## 2020-12-28 ENCOUNTER — Other Ambulatory Visit: Payer: Self-pay | Admitting: Family Medicine

## 2020-12-29 MED ORDER — LOSARTAN POTASSIUM 100 MG PO TABS: 100.0000 mg | ORAL_TABLET | Freq: Every day | ORAL | 3 refills | Status: DC

## 2020-12-29 MED ORDER — HYDROCHLOROTHIAZIDE 25 MG PO TABS: 25.0000 mg | ORAL_TABLET | Freq: Every day | ORAL | 3 refills | Status: DC

## 2020-12-29 NOTE — Telephone Encounter (Signed)
Spoke with patient and reviewed that mail order is not able to get the combination pill and wanted to make sure he was fine with me sending in the two medications. Inquired if he wants to have those still sent in to his mail order pharmacy and he was agreeable. Sent in the two medications and he verbalized understanding of our conversation, agreeable with prescriptions, and had no further questions at this time.

## 2020-12-29 NOTE — Telephone Encounter (Signed)
Did they mention a dose on these medications.

## 2020-12-29 NOTE — Telephone Encounter (Signed)
Was the alternatives list from pharmacy I gave any help?

## 2020-12-29 NOTE — Telephone Encounter (Signed)
Sent patient MyChart message to schedule appt with Dr. Katrinka Blazing for refill request.

## 2020-12-29 NOTE — Telephone Encounter (Signed)
Left voicemail message to call back regarding prescription that is out of stock. After discussion with his pharmacy they are able to provide both medications separate so wanted to review this with patient to make sure this is fine with him before sending them both in.

## 2020-12-29 NOTE — Telephone Encounter (Signed)
Spoke with pharmacist regarding reports of back order for the combo medication. She states that we can send them in separate prescriptions and fill them that way. Advised I would reach out to patient to update him on this and send those in for processing. She verbalized understanding with no further questions.

## 2020-12-29 NOTE — Telephone Encounter (Signed)
With the Losartan/HCTZ shortages, Dr. Mariah Milling has mention in past to utilize the alteratives sent from pharmacy: olmesartan, irbesartan, or telmisartan with the HCTZ added. Which ever one is the cheapest and covered on their formulary plan with their insurance.  Could also wait for Dr. Mariah Milling response

## 2020-12-29 NOTE — Telephone Encounter (Signed)
They report this medication is out of stock and would like to see if we could switch to alternative. I did send over to [facility] as well.

## 2021-01-05 ENCOUNTER — Encounter: Payer: Self-pay | Admitting: Internal Medicine

## 2021-01-05 ENCOUNTER — Ambulatory Visit: Payer: BC Managed Care – PPO | Admitting: Internal Medicine

## 2021-01-05 ENCOUNTER — Other Ambulatory Visit: Payer: Self-pay

## 2021-01-05 VITALS — BP 146/84 | HR 58 | Temp 98.8°F | Ht 75.0 in | Wt 377.6 lb

## 2021-01-05 DIAGNOSIS — F331 Major depressive disorder, recurrent, moderate: Secondary | ICD-10-CM | POA: Diagnosis not present

## 2021-01-05 DIAGNOSIS — Z23 Encounter for immunization: Secondary | ICD-10-CM | POA: Diagnosis not present

## 2021-01-05 DIAGNOSIS — M159 Polyosteoarthritis, unspecified: Secondary | ICD-10-CM

## 2021-01-05 DIAGNOSIS — G4733 Obstructive sleep apnea (adult) (pediatric): Secondary | ICD-10-CM

## 2021-01-05 MED ORDER — VENLAFAXINE HCL ER 75 MG PO CP24: 75.0000 mg | ORAL_CAPSULE | Freq: Every day | ORAL | 3 refills | Status: DC

## 2021-01-05 MED ORDER — HYDROCODONE-ACETAMINOPHEN 10-325 MG PO TABS: 1.0000 | ORAL_TABLET | Freq: Three times a day (TID) | ORAL | 0 refills | Status: DC | PRN

## 2021-01-05 NOTE — Patient Instructions (Signed)
Continue using heat on the muscle contusion  Hydrocodone refilled   I have increased your Effexor (venlafaxie XR) dose to 75 mg once daily.  rx sent to Mail Order

## 2021-01-05 NOTE — Progress Notes (Signed)
Subjective:  Patient ID: Johnathan [name], male    DOB: 1956-10-10  Age: 65 y.o. MRN: [id number]  CC: The primary encounter diagnosis was Need for pneumococcal vaccine. 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  HPI LIN GLAZIER presents for medication refill.  Last seen Dec 20.    This visit occurred during the SARS-CoV-2 public health emergency.  Safety protocols were in place, including screening questions prior to the visit, additional usage of staff PPE, and extensive cleaning of exam room while observing appropriate contact time as indicated for disinfecting solutions.   ccL new onset lumbar pain on the right,  Aggravated by recurrent falls  Because he refuses to stop working in the yard and in the shop.  Last vicodin  refill Feb 18 per [place] database.  2 falls onto sciatic notch/.  pain radiates from tailbone to buttock but not below.  All pain relieved by lying down in recliner, not completely flat.  Bothered by walking and sitting .  Aches during the day.  Worse in the morning upon waking .  Taking bid gabapentin  and 800 mg ibuprofen only at night   Going to [place] for 6 weeks to visit son in [place]. 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  No chest pain.  Has untreated OSA. Body habitus dicussed and reviewed as contributing to hypoventilation.   3) depression worse for the last 3 months ,  Occurs every winter. Aggravated by his loss of strength and limitations due to back pain.  Has gained weight.  Taking effexor at the lowest dose. Discussed increasing it     Outpatient Medications Prior to Visit  Medication Sig Dispense Refill   amiodarone (PACERONE) 200 MG tablet Take 1 tablet (200 mg total) by mouth 2 (two) times daily. 180 tablet 1    B Complex Vitamins (B COMPLEX 50 PO) Take 1 tablet by mouth daily.      buPROPion (WELLBUTRIN XL) 150 MG 24 hr tablet TAKE 3 TABLETS BY MOUTH  DAILY 270 tablet 3   Cholecalciferol (VITAMIN D3) 2000 UNITS TABS Take 1 tablet by mouth daily.      dabigatran (PRADAXA) 150 MG CAPS capsule TAKE 1 CAPSULE BY MOUTH TWICE DAILY 180 capsule 1   docusate sodium (COLACE) 100 MG capsule Take 400 mg by mouth daily.      Ferrous Gluconate 324 (37.5 Fe) MG TABS Take 1 tablet by mouth daily.      furosemide (LASIX) 40 MG tablet Take 1 tablet (40 mg total) by mouth daily as needed. 90 tablet 3   gabapentin (NEURONTIN) 300 MG capsule Take 1 capsule (300 mg total) by mouth 3 (three) times daily. 270 capsule 3   GLUCOSAMINE HCL-MSM PO Take 1 tablet by mouth daily.      hydrochlorothiazide (HYDRODIURIL) 25 MG tablet Take 1 tablet (25 mg total) by mouth daily. 90 tablet 3   HYDROcodone-acetaminophen (NORCO) 10-325 MG tablet Take 1 tablet by mouth every 8 (eight) hours as needed. 90 tablet 0   levothyroxine (SYNTHROID) 25 MCG tablet TAKE 1 TABLET BY MOUTH  DAILY BEFORE BREAKFAST 90 tablet 3   losartan (COZAAR) 100 MG tablet Take 1 tablet (100 mg total) by mouth daily. 90 tablet 3   Multiple Vitamin (MULTIVITAMIN)  tablet Take 1 tablet by mouth daily.     nitroGLYCERIN (NITROSTAT) 0.4 MG SL tablet Place 1 tablet (0.4 mg total) under the tongue every 5 (five) minutes as needed for chest pain. 30 tablet 3   Potassium Chloride ER 20 MEQ TBCR Take 20 mEq by mouth 2 (two) times daily as needed. 180 tablet 3   propranolol (INDERAL) 20 MG tablet Take 20 mg by mouth 3 (three) times daily as needed. Reported on 03/22/2016     vitamin B-12 (CYANOCOBALAMIN) 500 MCG tablet Take 500 mcg by mouth daily.     vitamin E 400 UNIT capsule Take 400 Units by mouth daily.     HYDROcodone-acetaminophen (NORCO) 10-325 MG tablet Take 1 tablet by mouth every 8 (eight) hours as needed for severe pain. 90 tablet 0   metoprolol  succinate (TOPROL-XL) 25 MG 24 hr tablet TAKE 1 TABLET(25 MG) BY MOUTH DAILY 90 tablet 1   venlafaxine XR (EFFEXOR-XR) 37.5 MG 24 hr capsule TAKE 1 CAPSULE BY MOUTH  DAILY WITH BREAKFAST 90 capsule 1   HYDROcodone-acetaminophen (NORCO) 10-325 MG tablet Take 1 tablet by mouth every 8 (eight) hours as needed for severe pain. 90 tablet 0   No facility-administered medications prior to visit.    Review of Systems;  Patient denies headache, fevers, malaise, unintentional weight loss, skin rash, eye pain, sinus congestion and sinus pain, sore throat, dysphagia,  hemoptysis , cough, dyspnea, wheezing, chest pain, palpitations, orthopnea, edema, abdominal pain, nausea, melena, diarrhea, constipation, flank pain, dysuria, hematuria, urinary  Frequency, nocturia, numbness, tingling, seizures,  Focal weakness, Loss of consciousness,  Tremor, insomnia, depression, anxiety, and suicidal ideation.      Objective:  BP (!) 146/84 (BP Location: Left Arm, Patient Position: Sitting, Cuff Size: Large)    Pulse (!) 58    Temp 98.8 F (37.1 C) (Oral)    Ht 6\' 3"  (1.905 m)    Wt (!) 377 lb 9.6 oz (171.3 kg)    SpO2 92%    BMI 47.20 kg/m   BP Readings from Last 3 Encounters:  01/05/21 (!) 146/84  09/29/20 (!) 146/84  06/25/20 (!) 149/88    Wt Readings from Last 3 Encounters:  01/05/21 (!) 377 lb 9.6 oz (171.3 kg)  09/29/20 (!) 360 lb 12.8 oz (163.7 kg)  06/25/20 (!) 342 lb (155.1 kg)    General appearance: alert, cooperative and appears stated age Ears: normal TM's and external ear canals both ears Throat: lips, mucosa, and tongue normal; teeth and gums normal Neck: no adenopathy, no carotid bruit, supple, symmetrical, trachea midline and thyroid not enlarged, symmetric, no tenderness/mass/nodules Back: symmetric, no curvature. ROM normal. No CVA tenderness. Lungs: clear to auscultation bilaterally Heart: regular rate and rhythm, S1, S2 normal, no murmur, click, rub or gallop Abdomen: morbidly obese  with central adiposity , non-tender; bowel sounds normal; no masses,  no organomegaly Pulses: 2+ and symmetric Skin: Skin color, texture, turgor normal. No rashes or lesions Lymph nodes: Cervical, supraclavicular, and axillary nodes normal.  Lab Results  Component Value Date   HGBA1C 5.6 05/29/2020   HGBA1C 5.6 03/29/2018   HGBA1C 5.0 03/29/2016    Lab Results  Component Value Date   CREATININE 1.07 09/29/2020   CREATININE 1.09 03/07/2020   CREATININE 0.94 09/04/2019    Lab Results  Component Value Date   WBC 7.3 09/29/2020   HGB 15.2 09/29/2020   HCT 44.9 09/29/2020   PLT 160 09/29/2020   GLUCOSE 84 09/29/2020   CHOL 163 09/29/2020  TRIG 94 09/29/2020   HDL 45 09/29/2020   LDLCALC 101 (H) 09/29/2020   ALT 21 09/29/2020   AST 17 09/29/2020   NA 140 09/29/2020   K 4.3 09/29/2020   CL 103 09/29/2020   CREATININE 1.07 09/29/2020   BUN 27 09/29/2020   CO2 21 09/29/2020   TSH 3.780 09/29/2020   INR 1.21 06/09/2017   HGBA1C 5.6 05/29/2020    No results found.  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 .  I have discussed the long term complications of untreated OSA, andrecommended repeat sleep study and use of CPAP .  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 Marland Kitchen.  His Refill history was confirmed via [place] Controlled Substance database by me today.   I have refilled his hydrocodone for  Use not more than 3 times daily for three months  and advised him to continue to maximize his tylenol use to 2000 mg daily. .  NSAIDs are contraindicated due to history of gastric bypass       Relevant Medications   HYDROcodone-acetaminophen (NORCO) 10-325 MG tablet    Other Visit Diagnoses    Need for pneumococcal vaccine    -  Primary   Relevant Orders   Pneumococcal conjugate vaccine 20-valent (Prevnar 20) (Completed)      I am having Deyvi A. Anstey maintain his Ferrous Gluconate, multivitamin, B Complex Vitamins (B COMPLEX 50 PO), vitamin B-12, Vitamin D3, GLUCOSAMINE HCL-MSM PO, vitamin E, nitroGLYCERIN, Potassium Chloride ER, propranolol, docusate sodium, furosemide, gabapentin, levothyroxine, buPROPion, amiodarone, HYDROcodone-acetaminophen, dabigatran, losartan, hydrochlorothiazide, HYDROcodone-acetaminophen, and venlafaxine XR.  Meds ordered this encounter  Medications   HYDROcodone-acetaminophen (NORCO) 10-325 MG tablet    Sig: Take 1 tablet by mouth every 8 (eight) hours as needed for severe pain.    Dispense:  90 tablet    Refill:  0   DISCONTD: venlafaxine XR (EFFEXOR-XR) 75 MG 24 hr capsule    Sig: Take 1 capsule (75 mg total) by mouth daily with breakfast.    Dispense:  90 capsule    Refill:  3   venlafaxine XR (EFFEXOR-XR) 75 MG 24 hr capsule    Sig: Take 1 capsule (75 mg total) by mouth daily with breakfast.    Dispense:  90 capsule    Refill:  3    Medications Discontinued During This Encounter  Medication Reason   HYDROcodone-acetaminophen (NORCO) 10-325 MG tablet Patient Preference   HYDROcodone-acetaminophen (NORCO) 10-325 MG tablet Reorder   venlafaxine XR (EFFEXOR-XR) 37.5 MG 24 hr capsule    venlafaxine XR (EFFEXOR-XR) 75  MG 24 hr  capsule Reorder   I provided  30 minutes of  face-to-face time during this encounter reviewing patient's current problems and past surgeries, labs and imaging studies, providing counseling on the above mentioned problems , and coordination  of care .  Follow-up: Return in about 3 months (around 04/07/2021).   Sherlene Shams, MD

## 2021-01-06 ENCOUNTER — Other Ambulatory Visit: Payer: Self-pay | Admitting: Internal Medicine

## 2021-01-06 NOTE — Assessment & Plan Note (Signed)
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 Marland Kitchen  His Refill history was confirmed via [place] Controlled Substance database by me today.   I have refilled his hydrocodone for  Use not more than 3 times daily for three months  and advised him to continue to maximize his tylenol use to 2000 mg daily. .  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

## 2021-01-06 NOTE — Assessment & Plan Note (Signed)
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 .  I have discussed the long term complications of untreated OSA, andrecommended repeat sleep study and use of CPAP .  He has continued to decline treatment

## 2021-01-06 NOTE — Assessment & Plan Note (Signed)
Increasing his effexor dose today to 75 mg,  Continue wellbutrin as well

## 2021-02-04 ENCOUNTER — Telehealth: Payer: Self-pay

## 2021-02-04 NOTE — Telephone Encounter (Signed)
LMTCB

## 2021-02-04 NOTE — Telephone Encounter (Signed)
Pt's wife and pt called to ask for refill on HYDROcodone-acetaminophen (NORCO) 10-325 MG tablet. They are currently in [place] and state that Dr Darrick Huntsman was aware of this. They state that Dr Darrick Huntsman told them to call and she would send it into whatever pharmacy that they needed in [place]. I explained to them the office policy for refills on controlled substances and the wife stated "Ma'am, we are very aware of the policy and have already went over this with Dr. Darrick Huntsman." I let them know that I would send a message back to you. Please advise

## 2021-02-05 MED ORDER — HYDROCODONE-ACETAMINOPHEN 10-325 MG PO TABS: 1.0000 | ORAL_TABLET | Freq: Three times a day (TID) | ORAL | 0 refills | Status: DC | PRN

## 2021-02-05 NOTE — Telephone Encounter (Signed)
Sent to [facility] in [place].  MyChart message sent

## 2021-02-05 NOTE — Telephone Encounter (Signed)
Last refilled: 01/05/2021 Last OV: 01/05/2021 Next OV: 03/11/2021  Send to the pharmacy in [place] that has been added to pt's chart.

## 2021-02-05 NOTE — Addendum Note (Signed)
Addended by: Sherlene Shams on: 02/05/2021 11:21 AM   Modules accepted: Orders

## 2021-02-05 NOTE — Addendum Note (Signed)
Addended by: Sandy Salaam on: 02/05/2021 09:54 AM   Modules accepted: Orders

## 2021-02-06 ENCOUNTER — Telehealth: Payer: Self-pay | Admitting: Internal Medicine

## 2021-02-06 ENCOUNTER — Other Ambulatory Visit: Payer: Self-pay | Admitting: Internal Medicine

## 2021-02-06 MED ORDER — ACETAMINOPHEN-CODEINE #4 300-60 MG PO TABS: 1.0000 | ORAL_TABLET | ORAL | 0 refills | Status: DC | PRN

## 2021-03-04 ENCOUNTER — Other Ambulatory Visit: Payer: Self-pay

## 2021-03-04 ENCOUNTER — Ambulatory Visit: Payer: Medicare HMO | Admitting: Cardiovascular Disease

## 2021-03-04 ENCOUNTER — Encounter: Payer: Self-pay | Admitting: Cardiovascular Disease

## 2021-03-04 VITALS — BP 156/89 | HR 60 | Ht 75.0 in | Wt 370.1 lb

## 2021-03-04 DIAGNOSIS — E7849 Other hyperlipidemia: Secondary | ICD-10-CM

## 2021-03-04 DIAGNOSIS — I10 Essential (primary) hypertension: Secondary | ICD-10-CM | POA: Diagnosis not present

## 2021-03-04 DIAGNOSIS — I5043 Acute on chronic combined systolic (congestive) and diastolic (congestive) heart failure: Secondary | ICD-10-CM

## 2021-03-04 DIAGNOSIS — I4819 Other persistent atrial fibrillation: Secondary | ICD-10-CM

## 2021-03-04 DIAGNOSIS — I872 Venous insufficiency (chronic) (peripheral): Secondary | ICD-10-CM | POA: Diagnosis not present

## 2021-03-04 DIAGNOSIS — Z86718 Personal history of other venous thrombosis and embolism: Secondary | ICD-10-CM | POA: Diagnosis not present

## 2021-03-04 MED ORDER — DABIGATRAN ETEXILATE MESYLATE 150 MG PO CAPS: ORAL_CAPSULE | ORAL | 3 refills | Status: DC

## 2021-03-04 MED ORDER — METOPROLOL SUCCINATE ER 25 MG PO TB24: ORAL_TABLET | ORAL | 3 refills | Status: DC

## 2021-03-04 MED ORDER — FUROSEMIDE 40 MG PO TABS: 40.0000 mg | ORAL_TABLET | Freq: Every day | ORAL | 3 refills | Status: DC | PRN

## 2021-03-04 MED ORDER — LOSARTAN POTASSIUM 100 MG PO TABS: 100.0000 mg | ORAL_TABLET | Freq: Every day | ORAL | 3 refills | Status: DC

## 2021-03-04 MED ORDER — PROPRANOLOL HCL 20 MG PO TABS: 20.0000 mg | ORAL_TABLET | Freq: Three times a day (TID) | ORAL | 3 refills | Status: DC | PRN

## 2021-03-04 MED ORDER — NITROGLYCERIN 0.4 MG SL SUBL: 0.4000 mg | SUBLINGUAL_TABLET | SUBLINGUAL | 3 refills | Status: DC | PRN

## 2021-03-04 MED ORDER — LEVOTHYROXINE SODIUM 25 MCG PO TABS: 25.0000 ug | ORAL_TABLET | Freq: Every day | ORAL | 3 refills | Status: DC

## 2021-03-04 MED ORDER — AMIODARONE HCL 200 MG PO TABS: 200.0000 mg | ORAL_TABLET | Freq: Two times a day (BID) | ORAL | 3 refills | Status: DC

## 2021-03-04 MED ORDER — AMIODARONE HCL 200 MG PO TABS
200.0000 mg | ORAL_TABLET | Freq: Two times a day (BID) | ORAL | 3 refills | Status: DC
Start: 2021-03-04 — End: 2021-03-10

## 2021-03-04 MED ORDER — POTASSIUM CHLORIDE ER 20 MEQ PO TBCR: 20.0000 meq | EXTENDED_RELEASE_TABLET | Freq: Two times a day (BID) | ORAL | 3 refills | Status: DC | PRN

## 2021-03-04 MED ORDER — HYDROCHLOROTHIAZIDE 25 MG PO TABS: 25.0000 mg | ORAL_TABLET | Freq: Every day | ORAL | 3 refills | Status: DC

## 2021-03-04 MED ORDER — LOSARTAN POTASSIUM 100 MG PO TABS
100.0000 mg | ORAL_TABLET | Freq: Every day | ORAL | 3 refills | Status: DC
Start: 2021-03-04 — End: 2021-03-04

## 2021-03-04 MED ORDER — HYDROCHLOROTHIAZIDE 25 MG PO TABS
25.0000 mg | ORAL_TABLET | Freq: Every day | ORAL | 3 refills | Status: DC
Start: 2021-03-04 — End: 2021-03-04

## 2021-03-04 NOTE — Progress Notes (Signed)
Cardiology Office Note  Date:  03/04/2021   ID:  Johnathan [name], DOB [birth year], MRN [id number]  PCP:  Sherlene Shams, MD   Chief Complaint  Patient presents with  . 12 month follow up     Patient c/o dizziness with positional changes, shortness of breath and chest pain. Medications reviewed by the patient verbally.     HPI:  [name] is a pleasant 65 year old gentleman with  Morbid obesity chronic joint pain of the shoulders and knees on long-term low-dose narcotics,  hypertension,   bariatric surgery,  atrial fibrillation in August 2013,   normal ejection fraction at that time  sleep apnea, currently not on his CPAP who presents for follow-up of his atrial fibrillation.    Fell 3 months ago, Buttock pain  total knee replacement b/l 2019 Trouble walking Using a walker  Recently got back from [place], stressful trip  Not on HCTZ Little bit of leg swelling, blood pressure running higher Does not take Lasix  No regular exercise program Gets Pradaxa free through the company  Orthostatics checked on today's visit no significant drop in pressure, systolics in the 140s rate 60-70  Weight running higher  EKG personally reviewed by myself on todays visit Shows normal sinus rhythm rate 60 bpm no significant ST-T wave changes  Other past medical history reviewed  Echo 2016  - Left ventricle: The cavity size was normal. There was mild   concentric hypertrophy. Systolic function was mildly to   moderately reduced. The estimated ejection fraction was in the   range of 40% to 45%. Images were inadequate for LV wall motion   assessment. - Mitral valve: There was mild regurgitation. - Left atrium: The atrium was severely dilated. - Pulmonary arteries: Systolic pressure was within the normal   range.   previous cardioversion,successful in restoring normal sinus rhythm   tick bite in June 2016, he developed atrial fibrillation initially with RVR. Started on digoxin,  continued on metoprolol and diltiazem with improved heart rate. Echocardiogram and stress test at that time Cardiac exam shows slightly depressed ejection fraction, dilated left atrium, mild LVH. Ejection fraction estimated at 40-45%. Stress test with small region of apical perfusion defect, depressed ejection fraction.   PMH:   has a past medical history of Arthritis, B12 deficiency, Chronic systolic CHF (congestive heart failure) (HCC), Erythema migrans (Lyme disease) (04/08/2015), History of DVT (deep vein thrombosis) (2004), History of stress test, Hypertension, Hypothyroidism, Neuropathy, Obesity, OSA (obstructive sleep apnea), and PAF (paroxysmal atrial fibrillation) (HCC).  PSH:    Past Surgical History:  Procedure Laterality Date  . ARTHROSCOPIC REPAIR ACL Bilateral 1993   bilateral knee x2 on left and x2 on right  . BARIATRIC SURGERY  2004   [place] [place]   . BARIATRIC SURGERY    . CHOLECYSTECTOMY  1982  . ELECTROPHYSIOLOGIC STUDY N/A 06/20/2015   Procedure: CARDIOVERSION;  Surgeon: Antonieta Iba, MD;  Location: [facility] OR[facility];  Service: Cardiovascular;  Laterality: N/A;  . KNEE ARTHROPLASTY Right 06/20/2017   Procedure: COMPUTER ASSISTED TOTAL KNEE ARTHROPLASTY;  Surgeon: Donato Heinz, MD;  Location: [facility] ORS;  Service: Orthopedics;  Laterality: Right;  . MENISCUS DEBRIDEMENT Left 2011   Left knee  . REPLACEMENT TOTAL KNEE Left 07/2013  . TONSILLECTOMY     Current Outpatient Medications on File Prior to Visit  Medication Sig Dispense Refill  . acetaminophen-codeine (TYLENOL #4) 300-60 MG tablet Take 1 tablet by mouth every 4 (four) hours as needed for moderate pain.  180 tablet 0  . amiodarone (PACERONE) 200 MG tablet Take 1 tablet (200 mg total) by mouth 2 (two) times daily. 180 tablet 1  . B Complex Vitamins (B COMPLEX 50 PO) Take 1 tablet by mouth daily.     Marland Kitchen buPROPion (WELLBUTRIN XL) 150 MG 24 hr tablet TAKE 3 TABLETS BY MOUTH  DAILY 270 tablet 3  . Cholecalciferol (VITAMIN D3)  2000 UNITS TABS Take 1 tablet by mouth daily.     . dabigatran (PRADAXA) 150 MG CAPS capsule TAKE 1 CAPSULE BY MOUTH TWICE DAILY 180 capsule 1  . docusate sodium (COLACE) 100 MG capsule Take 400 mg by mouth daily.     . Ferrous Gluconate 324 (37.5 Fe) MG TABS Take 1 tablet by mouth daily.     . furosemide (LASIX) 40 MG tablet Take 1 tablet (40 mg total) by mouth daily as needed. 90 tablet 3  . gabapentin (NEURONTIN) 300 MG capsule Take 1 capsule (300 mg total) by mouth 3 (three) times daily. 270 capsule 3  . GLUCOSAMINE HCL-MSM PO Take 1 tablet by mouth daily.     . hydrochlorothiazide (HYDRODIURIL) 25 MG tablet Take 1 tablet (25 mg total) by mouth daily. 90 tablet 3  . HYDROcodone-acetaminophen (NORCO) 10-325 MG tablet Take 1 tablet by mouth every 8 (eight) hours as needed. 90 tablet 0  . levothyroxine (SYNTHROID) 25 MCG tablet TAKE 1 TABLET BY MOUTH  DAILY BEFORE BREAKFAST 90 tablet 3  . losartan (COZAAR) 100 MG tablet Take 1 tablet (100 mg total) by mouth daily. 90 tablet 3  . metoprolol succinate (TOPROL-XL) 25 MG 24 hr tablet TAKE 1 TABLET(25 MG) BY MOUTH DAILY 90 tablet 1  . Multiple Vitamin (MULTIVITAMIN) tablet Take 1 tablet by mouth daily.    . nitroGLYCERIN (NITROSTAT) 0.4 MG SL tablet Place 1 tablet (0.4 mg total) under the tongue every 5 (five) minutes as needed for chest pain. 30 tablet 3  . Potassium Chloride ER 20 MEQ TBCR Take 20 mEq by mouth 2 (two) times daily as needed. 180 tablet 3  . propranolol (INDERAL) 20 MG tablet Take 20 mg by mouth 3 (three) times daily as needed. Reported on 03/22/2016    . venlafaxine XR (EFFEXOR-XR) 75 MG 24 hr capsule Take 1 capsule (75 mg total) by mouth daily with breakfast. 90 capsule 3  . vitamin B-12 (CYANOCOBALAMIN) 500 MCG tablet Take 500 mcg by mouth daily.    . vitamin E 400 UNIT capsule Take 400 Units by mouth daily.     No current facility-administered medications on file prior to visit.     Allergies:   Patient has no known allergies.    Social History:  The patient  reports that he has never smoked. He has never used smokeless tobacco. He reports current alcohol use of about 2.0 standard drinks of alcohol per week. He reports that he does not use drugs.   Family History:   family history includes Alzheimer's disease in his father; Dementia in his mother; Diabetes in his father.    Review of Systems: Review of Systems  Constitutional: Negative.        Weight gain  Respiratory: Negative.   Cardiovascular: Negative.   Gastrointestinal: Negative.   Musculoskeletal: Positive for joint pain.       Gait instability  Neurological: Negative.   Psychiatric/Behavioral: Negative.   All other systems reviewed and are negative.    PHYSICAL EXAM: VS:  BP (!) 156/89 (BP Location: Left Arm, Patient Position: Sitting,  Cuff Size: Large)   Pulse 60   Ht 6\' 3"  (1.905 m)   Wt (!) 370 lb 2 oz (167.9 kg)   SpO2 98%   BMI 46.26 kg/m  , BMI Body mass index is 46.26 kg/m. Constitutional:  oriented to person, place, and time. No distress.  HENT:  Head: Grossly normal Eyes:  no discharge. No scleral icterus.  Neck: No JVD, no carotid bruits  Cardiovascular: Regular rate and rhythm, no murmurs appreciated Trace lower extremity edema bilaterally Pulmonary/Chest: Clear to auscultation bilaterally, no wheezes or rails Abdominal: Soft.  no distension.  no tenderness.  Musculoskeletal: Normal range of motion Neurological:  normal muscle tone. Coordination normal. No atrophy Skin: Skin warm and dry Psychiatric: normal affect, pleasant  Recent Labs: 09/29/2020: ALT 21; BUN 27; Creatinine, Ser 1.07; Hemoglobin 15.2; Platelets 160; Potassium 4.3; Sodium 140; TSH 3.780    Lipid Panel Lab Results  Component Value Date   CHOL 163 09/29/2020   HDL 45 09/29/2020   LDLCALC 101 (H) 09/29/2020   TRIG 94 09/29/2020      Wt Readings from Last 3 Encounters:  03/04/21 (!) 370 lb 2 oz (167.9 kg)  01/05/21 (!) 377 lb 9.6 oz (171.3 kg)   09/29/20 (!) 360 lb 12.8 oz (163.7 kg)     ASSESSMENT AND PLAN:  Atrial fibrillation, unspecified type (HCC) - Plan: EKG 12-Lead Maintaining normal sinus rhythm On amiodarone. pradaxa Surveillance labs within normal range Recommend he continue amiodarone daily High risk for sleep apnea  Essential hypertension - Plan: EKG 12-Lead Blood pressure elevated, recommend he get back on his HCTZ  Acute on chronic diastolic CHF (congestive heart failure) (HCC) Chronic lower extremity swelling, denies shortness of breath, weight is elevated High risk of diastolic CHF, recommend he moderate his fluid and salt intake Suggested he restart HCTZ given his high blood pressure  Severe obesity (BMI >= 40) (HCC) Unable to exercise secondary to chronic knee pain  We have encouraged continued exercise, careful diet management   Total encounter time more than 25 minutes  Greater than 50% was spent in counseling and coordination of care with the patient    Orders Placed This Encounter  Procedures  . EKG 12-Lead     Signed, 10/01/20, M.D., Ph.D. 03/04/2021  [facility] [place], [place]

## 2021-03-04 NOTE — Addendum Note (Signed)
Addended by: Maple Hudson on: 03/04/2021 04:30 PM   Modules accepted: Orders

## 2021-03-04 NOTE — Addendum Note (Signed)
Addended by: Maple Hudson on: 03/04/2021 04:22 PM   Modules accepted: Orders

## 2021-03-04 NOTE — Patient Instructions (Signed)
Medication Instructions:  No changes  If you need a refill on your cardiac medications before your next appointment, please call your pharmacy.    Lab work: No new labs needed   If you have labs (blood work) drawn today and your tests are completely normal, you will receive your results only by: . MyChart Message (if you have MyChart) OR . A paper copy in the mail If you have any lab test that is abnormal or we need to change your treatment, we will call you to review the results.   Testing/Procedures: No new testing needed   Follow-Up: At [facility], you and your health needs are our priority.  As part of our continuing mission to provide you with exceptional heart care, we have created designated Provider Care Teams.  These Care Teams include your primary Cardiologist (physician) and Advanced Practice Providers (APPs -  Physician Assistants and Nurse Practitioners) who all work together to provide you with the care you need, when you need it.  . You will need a follow up appointment in 12 months  . Providers on your designated Care Team:   . Christopher Berge, NP . [name] Dunn, PA-C . Jacquelyn Visser, PA-C  Any Other Special Instructions Will Be Listed Below (If Applicable).  COVID-19 Vaccine Information can be found at: [email or web address] For questions related to vaccine distribution or appointments, please email [email or web address] or call [phone].     

## 2021-03-07 ENCOUNTER — Other Ambulatory Visit: Payer: Self-pay | Admitting: Internal Medicine

## 2021-03-07 ENCOUNTER — Other Ambulatory Visit: Payer: Self-pay | Admitting: Cardiovascular Disease

## 2021-03-11 ENCOUNTER — Other Ambulatory Visit: Payer: Self-pay

## 2021-03-11 ENCOUNTER — Encounter: Payer: Self-pay | Admitting: Internal Medicine

## 2021-03-11 ENCOUNTER — Ambulatory Visit (INDEPENDENT_AMBULATORY_CARE_PROVIDER_SITE_OTHER): Payer: Medicare HMO | Admitting: Internal Medicine

## 2021-03-11 VITALS — BP 146/86 | HR 85 | Temp 95.5°F | Resp 16 | Ht 75.0 in | Wt 371.6 lb

## 2021-03-11 DIAGNOSIS — I10 Essential (primary) hypertension: Secondary | ICD-10-CM | POA: Diagnosis not present

## 2021-03-11 DIAGNOSIS — R69 Illness, unspecified: Secondary | ICD-10-CM | POA: Diagnosis not present

## 2021-03-11 DIAGNOSIS — R7301 Impaired fasting glucose: Secondary | ICD-10-CM | POA: Diagnosis not present

## 2021-03-11 DIAGNOSIS — E039 Hypothyroidism, unspecified: Secondary | ICD-10-CM | POA: Diagnosis not present

## 2021-03-11 DIAGNOSIS — M159 Polyosteoarthritis, unspecified: Secondary | ICD-10-CM | POA: Diagnosis not present

## 2021-03-11 DIAGNOSIS — E7849 Other hyperlipidemia: Secondary | ICD-10-CM

## 2021-03-11 DIAGNOSIS — F331 Major depressive disorder, recurrent, moderate: Secondary | ICD-10-CM

## 2021-03-11 MED ORDER — HYDROCODONE-ACETAMINOPHEN 10-325 MG PO TABS: 1.0000 | ORAL_TABLET | Freq: Three times a day (TID) | ORAL | 0 refills | Status: DC | PRN

## 2021-03-11 NOTE — Progress Notes (Signed)
Subjective:  Patient ID: Johnathan [name], male    DOB: 05-Apr-1956  Age: 65 y.o. MRN: [id number]  CC: The primary encounter diagnosis was Acquired hypothyroidism. 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  HPI Johnathan [name] presents for follow up on chronic back and knee pain complicated by morbid obesity  Just returned from [place] 2 weeks ago where he and wife spent several weeks visiting with family.  During their stay he was physically active and was unable to refill his hydrocodone in [place] and was limited to tylenol #3   MDD:  Mood has improved since last medication change and addition of effexor to wellbutrin    Outpatient Medications Prior to Visit  Medication Sig Dispense Refill  . amiodarone (PACERONE) 200 MG tablet TAKE 1 TABLET BY MOUTH  TWICE DAILY 180 tablet 3  . B Complex Vitamins (B COMPLEX 50 PO) Take 1 tablet by mouth daily.     Marland Kitchen buPROPion (WELLBUTRIN XL) 150 MG 24 hr tablet TAKE 3 TABLETS BY MOUTH  DAILY 270 tablet 3  . Cholecalciferol (VITAMIN D3) 2000 UNITS TABS Take 1 tablet by mouth daily.     . dabigatran (PRADAXA) 150 MG CAPS capsule TAKE 1 CAPSULE BY MOUTH TWICE DAILY 180 capsule 3  . docusate sodium (COLACE) 100 MG capsule Take 400 mg by mouth daily.     . Ferrous Gluconate 324 (37.5 Fe) MG TABS Take 1 tablet by mouth daily.     . furosemide (LASIX) 40 MG tablet Take 1 tablet (40 mg total) by mouth daily as needed. 90 tablet 3  . gabapentin (NEURONTIN) 300 MG capsule Take 1 capsule (300 mg total) by mouth 3 (three) times daily. 270 capsule 3  . GLUCOSAMINE HCL-MSM PO Take 1 tablet by mouth daily.     . hydrochlorothiazide (HYDRODIURIL) 25 MG tablet Take 1 tablet (25 mg total) by mouth daily. 90 tablet 3  . levothyroxine (SYNTHROID) 25 MCG  tablet Take 1 tablet (25 mcg total) by mouth daily before breakfast. 90 tablet 3  . losartan (COZAAR) 100 MG tablet Take 1 tablet (100 mg total) by mouth daily. 90 tablet 3  . metoprolol succinate (TOPROL-XL) 25 MG 24 hr tablet TAKE 1 TABLET BY MOUTH  DAILY 90 tablet 3  . Multiple Vitamin (MULTIVITAMIN) tablet Take 1 tablet by mouth daily.    . nitroGLYCERIN (NITROSTAT) 0.4 MG SL tablet Place 1 tablet (0.4 mg total) under the tongue every 5 (five) minutes as needed for chest pain. 30 tablet 3  . Potassium Chloride ER 20 MEQ TBCR Take 20 mEq by mouth 2 (two) times daily as needed. 180 tablet 3  . propranolol (INDERAL) 20 MG tablet Take 1 tablet (20 mg total) by mouth 3 (three) times daily as needed. Reported on 03/22/2016 270 tablet 3  . venlafaxine XR (EFFEXOR-XR) 75 MG 24 hr capsule Take 1 capsule (75 mg total) by mouth daily with breakfast. 90 capsule 3  . vitamin B-12 (CYANOCOBALAMIN) 500 MCG tablet Take 500 mcg by mouth daily.    . vitamin E 400 UNIT capsule Take 400 Units by mouth daily.    Marland Kitchen HYDROcodone-acetaminophen (NORCO) 10-325 MG tablet Take 1 tablet by mouth every 8 (eight) hours as needed. 90 tablet 0  . KLOR-CON M20 20 MEQ tablet Take 20 mEq by mouth 2 (two) times  daily.    . acetaminophen-codeine (TYLENOL #4) 300-60 MG tablet Take 1 tablet by mouth every 4 (four) hours as needed for moderate pain. (Patient not taking: Reported on 03/11/2021) 180 tablet 0   No facility-administered medications prior to visit.    Review of Systems;  Patient denies headache, fevers, malaise, unintentional weight loss, skin rash, eye pain, sinus congestion and sinus pain, sore throat, dysphagia,  hemoptysis , cough, dyspnea, wheezing, chest pain, palpitations, orthopnea, edema, abdominal pain, nausea, melena, diarrhea, constipation, flank pain, dysuria, hematuria, urinary  Frequency, nocturia, numbness, tingling, seizures,  Focal weakness, Loss of consciousness,  Tremor, insomnia, depression, anxiety, and  suicidal ideation.      Objective:  BP (!) 146/86 (BP Location: Left Arm, Patient Position: Sitting, Cuff Size: Large)   Pulse 85   Temp (!) 95.5 F (35.3 C) (Temporal)   Resp 16   Ht 6\' 3"  (1.905 m)   Wt (!) 371 lb 9.6 oz (168.6 kg)   SpO2 96%   BMI 46.45 kg/m   BP Readings from Last 3 Encounters:  03/11/21 (!) 146/86  03/04/21 (!) 156/89  01/05/21 (!) 146/84    Wt Readings from Last 3 Encounters:  03/11/21 (!) 371 lb 9.6 oz (168.6 kg)  03/04/21 (!) 370 lb 2 oz (167.9 kg)  01/05/21 (!) 377 lb 9.6 oz (171.3 kg)    General appearance: alert, cooperative and appears stated age Ears: normal TM's and external ear canals both ears Throat: lips, mucosa, and tongue normal; teeth and gums normal Neck: no adenopathy, no carotid bruit, supple, symmetrical, trachea midline and thyroid not enlarged, symmetric, no tenderness/mass/nodules Back: symmetric, no curvature. ROM normal. No CVA tenderness. Lungs: clear to auscultation bilaterally Heart: regular rate and rhythm, S1, S2 normal, no murmur, click, rub or gallop Abdomen: soft, non-tender; bowel sounds normal; no masses,  no organomegaly Pulses: 2+ and symmetric Skin: Skin color, texture, turgor normal. No rashes or lesions Lymph nodes: Cervical, supraclavicular, and axillary nodes normal.  Lab Results  Component Value Date   HGBA1C 5.6 05/29/2020   HGBA1C 5.6 03/29/2018   HGBA1C 5.0 03/29/2016    Lab Results  Component Value Date   CREATININE 1.07 09/29/2020   CREATININE 1.09 03/07/2020   CREATININE 0.94 09/04/2019    Lab Results  Component Value Date   WBC 7.3 09/29/2020   HGB 15.2 09/29/2020   HCT 44.9 09/29/2020   PLT 160 09/29/2020   GLUCOSE 84 09/29/2020   CHOL 163 09/29/2020   TRIG 94 09/29/2020   HDL 45 09/29/2020   LDLCALC 101 (H) 09/29/2020   ALT 21 09/29/2020   AST 17 09/29/2020   NA 140 09/29/2020   K 4.3 09/29/2020   CL 103 09/29/2020   CREATININE 1.07 09/29/2020   BUN 27 09/29/2020   CO2 21  09/29/2020   TSH 3.780 09/29/2020   INR 1.21 06/09/2017   HGBA1C 5.6 05/29/2020    No results found.  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 Marland Kitchen  His Refill history was confirmed via [place] Controlled Substance database by me today.   I have refilled his hydrocodone for  Use not more than 3 times daily for three months  and advised him to continue to maximize his tylenol use to 2000 mg daily. .  NSAIDs are contraindicated due to history of gastric bypass       Relevant Medications   HYDROcodone-acetaminophen (NORCO) 10-325 MG tablet   Essential hypertension    Improving control on current regimen of losartan, hctz  And metoprolol. No changes today       Hyperlipidemia   Major depressive disorder with current active episode    Improved mood since Increasing his effexor dose to 75 mg,  Continue wellbutrin as well        Other Visit Diagnoses    Impaired fasting glucose       Relevant Orders   Comprehensive metabolic panel   Hemoglobin A1c      I have discontinued Ramon Dredge A. Wisor's acetaminophen-codeine. I am also having him maintain his Ferrous Gluconate, multivitamin, B Complex Vitamins (B COMPLEX 50 PO), vitamin B-12, Vitamin D3, GLUCOSAMINE HCL-MSM PO, vitamin E, docusate sodium, gabapentin, buPROPion, venlafaxine XR, propranolol, furosemide, dabigatran, hydrochlorothiazide, levothyroxine, losartan, nitroGLYCERIN, Potassium Chloride ER, amiodarone, metoprolol succinate, Klor-Con M20, and  HYDROcodone-acetaminophen.  Meds ordered this encounter  Medications  . HYDROcodone-acetaminophen (NORCO) 10-325 MG tablet    Sig: Take 1 tablet by mouth every 8 (eight) hours as needed.    Dispense:  90 tablet    Refill:  0    Medications Discontinued During This Encounter  Medication Reason  . acetaminophen-codeine (TYLENOL #4) 300-60 MG tablet Completed Course  . HYDROcodone-acetaminophen (NORCO) 10-325 MG tablet Reorder    Follow-up: Return in about 3 months (around 06/11/2021).   Sherlene Shams, MD

## 2021-03-12 LAB — TSH: TSH: 4.25 u[IU]/mL (ref 0.35–4.50)

## 2021-03-12 LAB — HEMOGLOBIN A1C: Hgb A1c MFr Bld: 5.9 % (ref 4.6–6.5)

## 2021-03-12 NOTE — Assessment & Plan Note (Signed)
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 Marland Kitchen  His Refill history was confirmed via [place] Controlled Substance database by me today.   I have refilled his hydrocodone for  Use not more than 3 times daily for three months  and advised him to continue to maximize his tylenol use to 2000 mg daily. .  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

## 2021-03-12 NOTE — Assessment & Plan Note (Signed)
Improved mood since Increasing his effexor dose to 75 mg,  Continue wellbutrin as well

## 2021-03-12 NOTE — Assessment & Plan Note (Addendum)
Improving control on current regimen of losartan, hctz  And metoprolol. No changes today

## 2021-03-13 LAB — COMPREHENSIVE METABOLIC PANEL
ALT: 23 U/L (ref 0–53)
AST: 23 U/L (ref 0–37)
Albumin: 4.4 g/dL (ref 3.5–5.2)
Alkaline Phosphatase: 104 U/L (ref 39–117)
BUN: 23 mg/dL (ref 6–23)
CO2: 21 mEq/L (ref 19–32)
Calcium: 8.8 mg/dL (ref 8.4–10.5)
Chloride: 108 mEq/L (ref 96–112)
Creatinine, Ser: 1.13 mg/dL (ref 0.40–1.50)
GFR: 68.15 mL/min (ref >60.00)
Glucose, Bld: 96 mg/dL (ref 70–99)
Potassium: 4.1 mEq/L (ref 3.5–5.1)
Sodium: 146 mEq/L — ABNORMAL HIGH (ref 135–145)
Total Bilirubin: 0.3 mg/dL (ref 0.2–1.2)
Total Protein: 7.1 g/dL (ref 6.0–8.3)
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
## 2021-04-03 ENCOUNTER — Ambulatory Visit (INDEPENDENT_AMBULATORY_CARE_PROVIDER_SITE_OTHER): Payer: Medicare HMO

## 2021-04-03 ENCOUNTER — Encounter: Payer: Self-pay | Admitting: Family Medicine

## 2021-04-03 ENCOUNTER — Ambulatory Visit: Payer: Medicare HMO | Admitting: Family Medicine

## 2021-04-03 ENCOUNTER — Other Ambulatory Visit: Payer: Self-pay

## 2021-04-03 VITALS — BP 124/76 | HR 71 | Ht 75.0 in | Wt 376.0 lb

## 2021-04-03 DIAGNOSIS — M545 Low back pain, unspecified: Secondary | ICD-10-CM

## 2021-04-03 DIAGNOSIS — G629 Polyneuropathy, unspecified: Secondary | ICD-10-CM | POA: Diagnosis not present

## 2021-04-03 DIAGNOSIS — M25551 Pain in right hip: Secondary | ICD-10-CM

## 2021-04-03 DIAGNOSIS — M47816 Spondylosis without myelopathy or radiculopathy, lumbar region: Secondary | ICD-10-CM | POA: Diagnosis not present

## 2021-04-03 DIAGNOSIS — M25552 Pain in left hip: Secondary | ICD-10-CM

## 2021-04-03 DIAGNOSIS — M160 Bilateral primary osteoarthritis of hip: Secondary | ICD-10-CM | POA: Diagnosis not present

## 2021-04-03 DIAGNOSIS — M542 Cervicalgia: Secondary | ICD-10-CM | POA: Diagnosis not present

## 2021-04-03 DIAGNOSIS — M5416 Radiculopathy, lumbar region: Secondary | ICD-10-CM | POA: Diagnosis not present

## 2021-04-03 NOTE — Assessment & Plan Note (Addendum)
Patient did have a fall.  Awaiting x-rays at this moment.  Patient does have radicular symptoms going down the left side but also has signs and symptoms consistent with more of a spinal stenosis.  Discussed with patient about icing regimen and home exercises.  Continue the gabapentin 3 times daily at the moment.  We will do a short course of prednisone with patient's last A1c at 5.9.  Patient warned of potential side effects.  Patient also warned to watch for any type of bruising with patient being on a blood thinner.  This will only be for short course.  If no significant improvement we do need to consider the possibility of an MRI.  Patient knows if worsening symptoms over the weekend to seek medical attention immediately.

## 2021-04-03 NOTE — Patient Instructions (Addendum)
Xray today Prednisone daily for 5 days- watch for bruising.  Ice 20 minutes 2 times daily. Usually after activity and before bed. Gabapentin 3 times a day  If not better by Monday we need mri  If pain worsens significantly over the weekend seek medical attention  Have appointment otherwise in 2-3 weeks just in case OK to double book

## 2021-04-03 NOTE — Assessment & Plan Note (Signed)
Chronic neuropathy for quite some time.

## 2021-04-06 ENCOUNTER — Other Ambulatory Visit: Payer: Self-pay | Admitting: Cardiovascular Disease

## 2021-04-06 ENCOUNTER — Other Ambulatory Visit: Payer: Self-pay

## 2021-04-06 MED ORDER — PREDNISONE 20 MG PO TABS: 40.0000 mg | ORAL_TABLET | Freq: Every day | ORAL | 0 refills | Status: DC

## 2021-04-06 NOTE — Progress Notes (Signed)
Med filled per a verbal from Dr. Katrinka Blazing.

## 2021-04-10 ENCOUNTER — Telehealth: Payer: Self-pay | Admitting: Internal Medicine

## 2021-04-10 MED ORDER — HYDROCODONE-ACETAMINOPHEN 10-325 MG PO TABS: 1.0000 | ORAL_TABLET | Freq: Three times a day (TID) | ORAL | 0 refills | Status: DC | PRN

## 2021-04-10 NOTE — Telephone Encounter (Signed)
Patient called and needs a refill on his HYDROcodone-acetaminophen (NORCO) 10-325 MG tablet. Pharmacy is [facility].

## 2021-04-10 NOTE — Telephone Encounter (Signed)
RX Refill:norco Last Seen:03-11-21 Last ordered:03-11-21  

## 2021-04-16 NOTE — Progress Notes (Signed)
[name] [address] [place] [address] Phone: [phone] Subjective:   I Ronelle Nigh am serving as a Neurosurgeon for Dr. Antoine Primas.  This visit occurred during the SARS-CoV-2 public health emergency.  Safety protocols were in place, including screening questions prior to the visit, additional usage of staff PPE, and extensive cleaning of exam room while observing appropriate contact time as indicated for disinfecting solutions.   I'm seeing this patient by the request  of:  Sherlene Shams, MD  CC: Low back pain  [id number]  04/03/2021 Patient did have a fall.  Awaiting x-rays at this moment.  Patient does have radicular symptoms going down the left side but also has signs and symptoms consistent with more of a spinal stenosis.  Discussed with patient about icing regimen and home exercises.  Continue the gabapentin 3 times daily at the moment.  We will do a short course of prednisone with patient's last A1c at 5.9.  Patient warned of potential side effects.  Patient also warned to watch for any type of bruising with patient being on a blood thinner.  This will only be for short course.  If no significant improvement we do need to consider the possibility of an MRI.  Patient knows if worsening symptoms over the weekend to seek medical attention immediately.  Update 04/23/2021 GWYN MEHRING is a 65 y.o. male coming in with complaint of lumbar radiculopathy. Patient states he is 50% better. Pain with walking no pain with sitting. Still using walker. States his pain is in the lower back. Some numbness in the left pinky finger. Has gotten worse the past few months and states the pain is now all the time.  Patient is knows he can do some activity a little more than he did previously including standing.  He still has to take a rest from time to time and still has a catching sensation lacking making to stop activities.    Past Medical History:   Diagnosis Date   Arthritis    a. chronic joint pain   B12 deficiency    Chronic systolic CHF (congestive heart failure) (HCC)    a. echo 2013: EF of 50-55%, normal right ventricular systolic pressure, normal left atrium; b. EF 40-45%, inadequate for LV wall motion, mild MR, LA severely dilated @ 52 mm, PASP nl   Erythema migrans (Lyme disease) 04/08/2015   History of DVT (deep vein thrombosis) 2004   s/p bariatric surgery   History of stress test    a. there was no ST segment deviation noted during stress,    There is a small defect of moderate severity present in the apex location, suggestive of apical ischemia, intermediate risk, calculated EF 21% but visually appears to be 35-40%   Hypertension    Hypothyroidism    Neuropathy    Obesity    OSA (obstructive sleep apnea)    a. not compliant with CPAP   PAF (paroxysmal atrial fibrillation) (HCC)    a. CHADSVASc at least 2 (HTN and vascular disease); b. on Eliquis    Past Surgical History:  Procedure Laterality Date   ARTHROSCOPIC REPAIR ACL Bilateral 1993   bilateral knee x2 on left and x2 on right   BARIATRIC SURGERY  2004   Rou en Y    BARIATRIC SURGERY     CHOLECYSTECTOMY  1982   ELECTROPHYSIOLOGIC STUDY N/A 06/20/2015   Procedure: CARDIOVERSION;  Surgeon: Antonieta Iba, MD;  Location: [facility];  Service: Cardiovascular;  Laterality: N/A;   KNEE ARTHROPLASTY Right 06/20/2017   Procedure: COMPUTER ASSISTED TOTAL KNEE ARTHROPLASTY;  Surgeon: Donato Heinz, MD;  Location: [facility];  Service: Orthopedics;  Laterality: Right;   MENISCUS DEBRIDEMENT Left 2011   Left knee   REPLACEMENT TOTAL KNEE Left 07/2013   TONSILLECTOMY     Social History   Socioeconomic History   Marital status: Married    Spouse name: Not on file   Number of children: Not on file   Years of education: Not on file   Highest education level: Not on file  Occupational History   Not on file  Tobacco Use   Smoking status: Never   Smokeless tobacco:  Never  Vaping Use   Vaping Use: Never used  Substance and Sexual Activity   Alcohol use: Yes    Alcohol/week: 2.0 standard drinks    Types: 2 Shots of liquor per week    Comment: Vodka and lemonade occasionally 2-3/week    Drug use: No   Sexual activity: Yes    Partners: Female    Comment: Wife  Other Topics Concern   Not on file  Social History Narrative   Currently between jobs   Lives with Wife   1 Son (33) and 1 daughter (18)    2 grandchildren by daughter    1 dog and 1 cat    Enjoys gardening, movies, dining out.    Social Determinants of Health   Financial Resource Strain: Not on file  Food Insecurity: Not on file  Transportation Needs: Not on file  Physical Activity: Not on file  Stress: Not on file  Social Connections: Not on file   No Known Allergies Family History  Problem Relation Age of Onset   Dementia Mother    Diabetes Father    Alzheimer's disease Father     Current Outpatient Medications (Endocrine & Metabolic):    levothyroxine (SYNTHROID) 25 MCG tablet, Take 1 tablet (25 mcg total) by mouth daily before breakfast.   predniSONE (DELTASONE) 20 MG tablet, Take 2 tablets (40 mg total) by mouth daily with breakfast.  Current Outpatient Medications (Cardiovascular):    amiodarone (PACERONE) 200 MG tablet, TAKE 1 TABLET BY MOUTH  TWICE DAILY   furosemide (LASIX) 40 MG tablet, Take 1 tablet (40 mg total) by mouth daily as needed.   losartan-hydrochlorothiazide (HYZAAR) 100-25 MG tablet, TAKE 1 TABLET BY MOUTH EVERY DAY   metoprolol succinate (TOPROL-XL) 25 MG 24 hr tablet, TAKE 1 TABLET BY MOUTH  DAILY   nitroGLYCERIN (NITROSTAT) 0.4 MG SL tablet, Place 1 tablet (0.4 mg total) under the tongue every 5 (five) minutes as needed for chest pain.   propranolol (INDERAL) 20 MG tablet, Take 1 tablet (20 mg total) by mouth 3 (three) times daily as needed. Reported on 03/22/2016   Current Outpatient Medications (Analgesics):    HYDROcodone-acetaminophen (NORCO)  10-325 MG tablet, Take 1 tablet by mouth every 8 (eight) hours as needed.  Current Outpatient Medications (Hematological):    dabigatran (PRADAXA) 150 MG CAPS capsule, TAKE 1 CAPSULE BY MOUTH TWICE DAILY   Ferrous Gluconate 324 (37.5 Fe) MG TABS, Take 1 tablet by mouth daily.    vitamin B-12 (CYANOCOBALAMIN) 500 MCG tablet, Take 500 mcg by mouth daily.  Current Outpatient Medications (Other):    B Complex Vitamins (B COMPLEX 50 PO), Take 1 tablet by mouth daily.    buPROPion (WELLBUTRIN XL) 150 MG 24 hr tablet, TAKE 3 TABLETS BY MOUTH  DAILY  Cholecalciferol (VITAMIN D3) 2000 UNITS TABS, Take 1 tablet by mouth daily.    docusate sodium (COLACE) 100 MG capsule, Take 400 mg by mouth daily.    gabapentin (NEURONTIN) 300 MG capsule, Take 1 capsule (300 mg total) by mouth 3 (three) times daily.   GLUCOSAMINE HCL-MSM PO, Take 1 tablet by mouth daily.    KLOR-CON M20 20 MEQ tablet, Take 20 mEq by mouth 2 (two) times daily.   Multiple Vitamin (MULTIVITAMIN) tablet, Take 1 tablet by mouth daily.   Potassium Chloride ER 20 MEQ TBCR, Take 20 mEq by mouth 2 (two) times daily as needed.   venlafaxine XR (EFFEXOR-XR) 75 MG 24 hr capsule, Take 1 capsule (75 mg total) by mouth daily with breakfast.   vitamin E 400 UNIT capsule, Take 400 Units by mouth daily.    Review of Systems:  No headache, visual changes, nausea, vomiting, diarrhea, constipation, dizziness, abdominal pain, skin rash, fevers, chills, night sweats, weight loss, swollen lymph nodes, joint swelling, chest pain, shortness of breath, mood changes. POSITIVE muscle aches, body aches  Objective  Blood pressure (!) 150/92, pulse 66, height 6\' 3"  (1.905 m), weight (!) 373 lb (169.2 kg), SpO2 96 %.   General: No apparent distress alert and oriented x3 mood and affect normal, dressed appropriately.  Morbidly obese HEENT: Pupils equal, extraocular movements intact  Respiratory: Patient's speak in full sentences and does not appear short of  breath  Cardiovascular: Trace lower extremity edema, non tender, no erythema  Gait severely antalgic using the aid of a walker Patient is sitting comfortably.  Back still moderately tender.  More pain in the gluteal area as well.   Impression and Recommendations:    The above documentation has been reviewed and is accurate and complete , DO

## 2021-04-23 ENCOUNTER — Other Ambulatory Visit: Payer: Self-pay

## 2021-04-23 ENCOUNTER — Encounter: Payer: Self-pay | Admitting: Family Medicine

## 2021-04-23 ENCOUNTER — Ambulatory Visit: Payer: Medicare HMO | Admitting: Family Medicine

## 2021-04-23 VITALS — BP 150/92 | HR 66 | Ht 75.0 in | Wt 373.0 lb

## 2021-04-23 DIAGNOSIS — M5416 Radiculopathy, lumbar region: Secondary | ICD-10-CM

## 2021-04-23 DIAGNOSIS — M545 Low back pain, unspecified: Secondary | ICD-10-CM

## 2021-04-23 DIAGNOSIS — G8929 Other chronic pain: Secondary | ICD-10-CM

## 2021-04-23 MED ORDER — GABAPENTIN 300 MG PO CAPS: 300.0000 mg | ORAL_CAPSULE | Freq: Three times a day (TID) | ORAL | 3 refills | Status: DC

## 2021-04-23 NOTE — Assessment & Plan Note (Signed)
Patient is feeling approximately 50% better.  Continue on the gabapentin.  Patient will continue the ibuprofen and Tylenol and he declined any type of other prescription anti-inflammatory at this moment.  Patient did respond well to the prednisone when needed.  We will get patient into formal physical therapy and see if we can add in an aquatic therapy that I think will be beneficial for this individual.  Encourage patient to continue to use a walker for now.  Follow-up with me again in 6 weeks to make sure patient is responding.  Worsening pain we have already discussed again that we will need to consider the possibility of MRI

## 2021-04-23 NOTE — Patient Instructions (Addendum)
Good to see you Aquatic PT Continue gabapentin ok to take ibuprofen and tylenol with it Continue to increase activity See me again in 5-6 weeks

## 2021-05-12 ENCOUNTER — Telehealth: Payer: Self-pay | Admitting: Internal Medicine

## 2021-05-12 MED ORDER — BUPROPION HCL ER (XL) 150 MG PO TB24: 450.0000 mg | ORAL_TABLET | Freq: Every day | ORAL | 3 refills | Status: DC

## 2021-05-12 MED ORDER — VENLAFAXINE HCL ER 75 MG PO CP24: 75.0000 mg | ORAL_CAPSULE | Freq: Every day | ORAL | 3 refills | Status: DC

## 2021-05-12 NOTE — Telephone Encounter (Signed)
RX Refill:norco Last Seen:03-11-21 Last ordered:04-10-21  

## 2021-05-12 NOTE — Telephone Encounter (Signed)
Pt needs a refill on buPROPion (WELLBUTRIN XL) 150 MG 24 hr tablet and venlafaxine XR (EFFEXOR-XR) 75 MG 24 hr capsule sent to [facility]

## 2021-05-12 NOTE — Telephone Encounter (Signed)
Pt called back also needing a refill on HYDROcodone-acetaminophen (NORCO) 10-325 MG tablet

## 2021-05-12 NOTE — Addendum Note (Signed)
Addended by: Elise Benne T on: 05/12/2021 04:39 PM   Modules accepted: Orders

## 2021-05-13 MED ORDER — HYDROCODONE-ACETAMINOPHEN 10-325 MG PO TABS: 1.0000 | ORAL_TABLET | Freq: Three times a day (TID) | ORAL | 0 refills | Status: DC | PRN

## 2021-05-13 NOTE — Addendum Note (Signed)
Addended by: Sherlene Shams on: 05/13/2021 10:28 AM   Modules accepted: Orders

## 2021-05-28 ENCOUNTER — Ambulatory Visit: Payer: Medicare HMO | Admitting: Family Medicine

## 2021-05-28 DIAGNOSIS — Z6841 Body Mass Index (BMI) 40.0 and over, adult: Secondary | ICD-10-CM | POA: Diagnosis not present

## 2021-05-28 DIAGNOSIS — D6869 Other thrombophilia: Secondary | ICD-10-CM | POA: Diagnosis not present

## 2021-05-28 DIAGNOSIS — G629 Polyneuropathy, unspecified: Secondary | ICD-10-CM | POA: Diagnosis not present

## 2021-05-28 DIAGNOSIS — Z008 Encounter for other general examination: Secondary | ICD-10-CM | POA: Diagnosis not present

## 2021-05-28 DIAGNOSIS — R69 Illness, unspecified: Secondary | ICD-10-CM | POA: Diagnosis not present

## 2021-05-28 DIAGNOSIS — I4891 Unspecified atrial fibrillation: Secondary | ICD-10-CM | POA: Diagnosis not present

## 2021-05-28 DIAGNOSIS — I209 Angina pectoris, unspecified: Secondary | ICD-10-CM | POA: Diagnosis not present

## 2021-05-28 DIAGNOSIS — G8929 Other chronic pain: Secondary | ICD-10-CM | POA: Diagnosis not present

## 2021-05-28 DIAGNOSIS — E039 Hypothyroidism, unspecified: Secondary | ICD-10-CM | POA: Diagnosis not present

## 2021-06-16 NOTE — Progress Notes (Signed)
[name] [address] [place] [address] Phone: [phone] Subjective:   Johnathan [name], am serving as a scribe for Dr. Antoine Primas.  This visit occurred during the SARS-CoV-2 public health emergency.  Safety protocols were in place, including screening questions prior to the visit, additional usage of staff PPE, and extensive cleaning of exam room while observing appropriate contact time as indicated for disinfecting solutions.   I'm seeing this patient by the request  of:  Sherlene Shams, MD  CC: Low back pain and radiation follow-up  [id number]  04/23/2021 Patient is feeling approximately 50% better.  Continue on the gabapentin.  Patient will continue the ibuprofen and Tylenol and he declined any type of other prescription anti-inflammatory at this moment.  Patient did respond well to the prednisone when needed.  We will get patient into formal physical therapy and see if we can add in an aquatic therapy that I think will be beneficial for this individual.  Encourage patient to continue to use a walker for now.  Follow-up with me again in 6 weeks to make sure patient is responding.  Worsening pain we have already discussed again that we will need to consider the possibility of MRI  Update 06/17/2021 Johnathan [name] is a 65 y.o. male coming in with complaint of LBP. Patient states that steroids helped to decrease his pain. Has been active doing water aerobics.  Patient did have x-rays previously that showed that patient had extensive amount of arthritic changes.  Patient states that he is feeling more confident with walking.  Using more of a cane instead of a walker.      Past Medical History:  Diagnosis Date   Arthritis    a. chronic joint pain   B12 deficiency    Chronic systolic CHF (congestive heart failure) (HCC)    a. echo 2013: EF of 50-55%, normal right ventricular systolic pressure, normal left atrium; b. EF 40-45%, inadequate  for LV wall motion, mild MR, LA severely dilated @ 52 mm, PASP nl   Erythema migrans (Lyme disease) 04/08/2015   History of DVT (deep vein thrombosis) 2004   s/p bariatric surgery   History of stress test    a. there was no ST segment deviation noted during stress,    There is a small defect of moderate severity present in the apex location, suggestive of apical ischemia, intermediate risk, calculated EF 21% but visually appears to be 35-40%   Hypertension    Hypothyroidism    Neuropathy    Obesity    OSA (obstructive sleep apnea)    a. not compliant with CPAP   PAF (paroxysmal atrial fibrillation) (HCC)    a. CHADSVASc at least 2 (HTN and vascular disease); b. on Eliquis    Past Surgical History:  Procedure Laterality Date   ARTHROSCOPIC REPAIR ACL Bilateral 1993   bilateral knee x2 on left and x2 on right   BARIATRIC SURGERY  2004   [place]    BARIATRIC SURGERY     CHOLECYSTECTOMY  1982   ELECTROPHYSIOLOGIC STUDY N/A 06/20/2015   Procedure: CARDIOVERSION;  Surgeon: Antonieta Iba, MD;  Location: [facility] ORS;  Service: Cardiovascular;  Laterality: N/A;   KNEE ARTHROPLASTY Right 06/20/2017   Procedure: COMPUTER ASSISTED TOTAL KNEE ARTHROPLASTY;  Surgeon: Donato Heinz, MD;  Location: [facility] ORS;  Service: Orthopedics;  Laterality: Right;   MENISCUS DEBRIDEMENT Left 2011   Left knee   REPLACEMENT TOTAL KNEE  Left 07/2013   TONSILLECTOMY     Social History   Socioeconomic History   Marital status: Married    Spouse name: Not on file   Number of children: Not on file   Years of education: Not on file   Highest education level: Not on file  Occupational History   Not on file  Tobacco Use   Smoking status: Never   Smokeless tobacco: Never  Vaping Use   Vaping Use: Never used  Substance and Sexual Activity   Alcohol use: Yes    Alcohol/week: 2.0 standard drinks    Types: 2 Shots of liquor per week    Comment: Vodka and lemonade occasionally 2-3/week    Drug use: No   Sexual  activity: Yes    Partners: Female    Comment: Wife  Other Topics Concern   Not on file  Social History Narrative   Currently between jobs   Lives with Wife   1 Son (33) and 1 daughter (45)    2 grandchildren by daughter    1 dog and 1 cat    Enjoys gardening, movies, dining out.    Social Determinants of Health   Financial Resource Strain: Not on file  Food Insecurity: Not on file  Transportation Needs: Not on file  Physical Activity: Not on file  Stress: Not on file  Social Connections: Not on file   No Known Allergies Family History  Problem Relation Age of Onset   Dementia Mother    Diabetes Father    Alzheimer's disease Father     Current Outpatient Medications (Endocrine & Metabolic):    levothyroxine (SYNTHROID) 25 MCG tablet, Take 1 tablet (25 mcg total) by mouth daily before breakfast.   predniSONE (DELTASONE) 20 MG tablet, Take 2 tablets (40 mg total) by mouth daily with breakfast.  Current Outpatient Medications (Cardiovascular):    amiodarone (PACERONE) 200 MG tablet, TAKE 1 TABLET BY MOUTH  TWICE DAILY   furosemide (LASIX) 40 MG tablet, Take 1 tablet (40 mg total) by mouth daily as needed.   losartan-hydrochlorothiazide (HYZAAR) 100-25 MG tablet, TAKE 1 TABLET BY MOUTH EVERY DAY   metoprolol succinate (TOPROL-XL) 25 MG 24 hr tablet, TAKE 1 TABLET BY MOUTH  DAILY   nitroGLYCERIN (NITROSTAT) 0.4 MG SL tablet, Place 1 tablet (0.4 mg total) under the tongue every 5 (five) minutes as needed for chest pain.   propranolol (INDERAL) 20 MG tablet, Take 1 tablet (20 mg total) by mouth 3 (three) times daily as needed. Reported on 03/22/2016   Current Outpatient Medications (Analgesics):    HYDROcodone-acetaminophen (NORCO) 10-325 MG tablet, Take 1 tablet by mouth every 8 (eight) hours as needed.  Current Outpatient Medications (Hematological):    dabigatran (PRADAXA) 150 MG CAPS capsule, TAKE 1 CAPSULE BY MOUTH TWICE DAILY   Ferrous Gluconate 324 (37.5 Fe) MG TABS,  Take 1 tablet by mouth daily.    vitamin B-12 (CYANOCOBALAMIN) 500 MCG tablet, Take 500 mcg by mouth daily.  Current Outpatient Medications (Other):    B Complex Vitamins (B COMPLEX 50 PO), Take 1 tablet by mouth daily.    buPROPion (WELLBUTRIN XL) 150 MG 24 hr tablet, Take 3 tablets (450 mg total) by mouth daily.   Cholecalciferol (VITAMIN D3) 2000 UNITS TABS, Take 1 tablet by mouth daily.    docusate sodium (COLACE) 100 MG capsule, Take 400 mg by mouth daily.    gabapentin (NEURONTIN) 300 MG capsule, Take 1 capsule (300 mg total) by mouth 3 (three) times  daily.   GLUCOSAMINE HCL-MSM PO, Take 1 tablet by mouth daily.    KLOR-CON M20 20 MEQ tablet, Take 20 mEq by mouth 2 (two) times daily.   Multiple Vitamin (MULTIVITAMIN) tablet, Take 1 tablet by mouth daily.   Potassium Chloride ER 20 MEQ TBCR, Take 20 mEq by mouth 2 (two) times daily as needed.   venlafaxine XR (EFFEXOR-XR) 75 MG 24 hr capsule, Take 1 capsule (75 mg total) by mouth daily with breakfast.   vitamin E 400 UNIT capsule, Take 400 Units by mouth daily.   Reviewed prior external information including notes and imaging from  primary care provider As well as notes that were available from care everywhere and other healthcare systems.  Past medical history, social, surgical and family history all reviewed in electronic medical record.  No pertanent information unless stated regarding to the chief complaint.   Review of Systems:  No headache, visual changes, nausea, vomiting, diarrhea, constipation, dizziness, abdominal pain, skin rash, fevers, chills, night sweats, weight loss, swollen lymph nodes,  joint swelling, chest pain, shortness of breath, mood changes. POSITIVE muscle aches, body aches  Objective  Blood pressure 138/88, pulse (!) 58, height 6\' 3"  (1.905 m), weight (!) 381 lb (172.8 kg), SpO2 96 %.   General: No apparent distress alert and oriented x3 mood and affect normal, dressed appropriately.  HEENT: Pupils equal,  extraocular movements intact  Respiratory: Patient's speak in full sentences and does not appear short of breath  Cardiovascular: No lower extremity edema, non tender, no erythema  Gait antalgic walking with the aid of a cane. Patient does have weakness noted of the feet bilaterally with decreased dorsiflexion.  Patient is still significantly overweight.  Tightness with straight leg test secondary to body habitus.  No worsening radicular symptoms.  Patient is sitting comfortably in the chair.  Neuropathy noted of the feet bilaterally   Impression and Recommendations:     The above documentation has been reviewed and is accurate and complete , DO

## 2021-06-17 ENCOUNTER — Other Ambulatory Visit: Payer: Self-pay

## 2021-06-17 ENCOUNTER — Ambulatory Visit (INDEPENDENT_AMBULATORY_CARE_PROVIDER_SITE_OTHER): Payer: Medicare HMO | Admitting: Internal Medicine

## 2021-06-17 ENCOUNTER — Encounter: Payer: Self-pay | Admitting: Family Medicine

## 2021-06-17 ENCOUNTER — Telehealth: Payer: Self-pay

## 2021-06-17 ENCOUNTER — Ambulatory Visit: Payer: Medicare HMO | Admitting: Family Medicine

## 2021-06-17 ENCOUNTER — Encounter: Payer: Self-pay | Admitting: Internal Medicine

## 2021-06-17 VITALS — BP 140/84 | HR 57 | Temp 96.4°F | Ht 75.0 in | Wt 380.6 lb

## 2021-06-17 DIAGNOSIS — G629 Polyneuropathy, unspecified: Secondary | ICD-10-CM

## 2021-06-17 DIAGNOSIS — I4819 Other persistent atrial fibrillation: Secondary | ICD-10-CM | POA: Diagnosis not present

## 2021-06-17 DIAGNOSIS — F331 Major depressive disorder, recurrent, moderate: Secondary | ICD-10-CM | POA: Diagnosis not present

## 2021-06-17 DIAGNOSIS — D6869 Other thrombophilia: Secondary | ICD-10-CM

## 2021-06-17 DIAGNOSIS — M5416 Radiculopathy, lumbar region: Secondary | ICD-10-CM

## 2021-06-17 DIAGNOSIS — R69 Illness, unspecified: Secondary | ICD-10-CM | POA: Diagnosis not present

## 2021-06-17 DIAGNOSIS — M159 Polyosteoarthritis, unspecified: Secondary | ICD-10-CM

## 2021-06-17 MED ORDER — BUPROPION HCL ER (XL) 300 MG PO TB24: 300.0000 mg | ORAL_TABLET | Freq: Every day | ORAL | 1 refills | Status: DC

## 2021-06-17 MED ORDER — HYDROCODONE-ACETAMINOPHEN 10-325 MG PO TABS: 1.0000 | ORAL_TABLET | Freq: Three times a day (TID) | ORAL | 0 refills | Status: DC | PRN

## 2021-06-17 NOTE — Progress Notes (Signed)
Subjective:  Patient ID: Johnathan [name], male    DOB: [birth year]  Age: 65 y.o. MRN: [id number]  CC: The primary encounter diagnosis was Persistent atrial fibrillation (HCC). 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  HPI Johnathan [name] presents for follow up on chronic issues including morbid obesity, chronic low back pain , atrial fibrillation    Chief Complaint  Patient presents with   Follow-up    Medication refill   LOW BACK PAIN:  SEEING Johnathan [name].  Using cane for stability due to altered sense of balance resulting in frequent falls   Currently participating in a [employer] sponsored  program at the [employer]  currently only 2 times weekly  but plans to add a 3rd day per week.  The left sided radiculopathic pain resolved with prednisone course.   He requries use of opiods; NSAIDs are contraindicated due to use of anticoagulation. Refill history confirmed via [place] Controlled Substance databas, accessed by me today..   Morbid obesity: weight gain of  9  lbs since June office visit . Attribute weight gain to  depression , eating more .  Per wife he sits and eats a whole bag of chips at a time.    Depression:  wellbutrin rx at current dose (450 mg ) required prior authorization , which was not conveyed to office,  so he has finds that he is becoming easily frustrated of late due to dose reduction.  Advised to increase dose to  300 mg immediately daily.  refilled today while PA is obtained   Pradaxa is no longer covered by medicare.   Needs alternative .  Says he had an adverse reaction to xarelto   Using furosemide  not more than  once every 2 weeks. likes salt, avoids fried foods.    Refill history confirmed via [place] Controlled Substance databas, accessed by me today..   Outpatient Medications Prior to Visit  Medication Sig Dispense Refill   amiodarone (PACERONE) 200 MG tablet TAKE 1 TABLET BY MOUTH  TWICE DAILY 180 tablet 3   B Complex Vitamins (B COMPLEX 50 PO) Take 1 tablet by mouth daily.      Cholecalciferol (VITAMIN D3) 2000 UNITS TABS Take 1 tablet by mouth daily.      dabigatran (PRADAXA) 150 MG CAPS capsule TAKE 1 CAPSULE BY MOUTH TWICE DAILY 180 capsule 3   docusate sodium (COLACE) 100 MG capsule Take 400 mg by mouth daily.      Ferrous Gluconate 324 (37.5 Fe) MG TABS Take 1 tablet by mouth daily.      furosemide (LASIX) 40 MG tablet Take 1 tablet (40 mg total) by mouth daily as needed. 90 tablet 3   gabapentin (NEURONTIN) 300 MG capsule Take 1 capsule (300 mg total) by mouth 3 (three) times daily. 270 capsule 3   GLUCOSAMINE HCL-MSM PO Take 1 tablet by mouth daily.      KLOR-CON M20 20 MEQ tablet Take 20 mEq by mouth 2 (two) times daily.     levothyroxine (SYNTHROID) 25 MCG tablet Take 1 tablet (25 mcg total) by mouth daily before breakfast. 90 tablet 3   losartan-hydrochlorothiazide (HYZAAR) 100-25 MG tablet TAKE 1 TABLET BY MOUTH EVERY DAY 90 tablet 0   metoprolol succinate (TOPROL-XL) 25 MG 24 hr tablet TAKE 1 TABLET BY  MOUTH  DAILY 90 tablet 3   Multiple Vitamin (MULTIVITAMIN) tablet Take 1 tablet by mouth daily.     nitroGLYCERIN (NITROSTAT) 0.4 MG SL tablet Place 1 tablet (0.4 mg total) under the tongue every 5 (five) minutes as needed for chest pain. 30 tablet 3   Potassium Chloride ER 20 MEQ TBCR Take 20 mEq by mouth 2 (two) times daily as needed. 180 tablet 3   predniSONE (DELTASONE) 20 MG tablet Take 2 tablets (40 mg total) by mouth daily with breakfast. 10 tablet 0   propranolol (INDERAL) 20 MG tablet Take 1 tablet (20 mg total) by mouth 3 (three) times daily as needed. Reported on 03/22/2016 270 tablet 3   venlafaxine XR (EFFEXOR-XR) 75 MG 24 hr capsule Take 1 capsule (75 mg total) by mouth daily with breakfast. 90 capsule 3   vitamin  B-12 (CYANOCOBALAMIN) 500 MCG tablet Take 500 mcg by mouth daily.     vitamin E 400 UNIT capsule Take 400 Units by mouth daily.     buPROPion (WELLBUTRIN XL) 150 MG 24 hr tablet Take 3 tablets (450 mg total) by mouth daily. 270 tablet 3   HYDROcodone-acetaminophen (NORCO) 10-325 MG tablet Take 1 tablet by mouth every 8 (eight) hours as needed. 90 tablet 0   No facility-administered medications prior to visit.    Review of Systems;  Patient denies headache, fevers, malaise, unintentional weight loss, skin rash, eye pain, sinus congestion and sinus pain, sore throat, dysphagia,  hemoptysis , cough, dyspnea, wheezing, chest pain, palpitations, orthopnea, edema, abdominal pain, nausea, melena, diarrhea, constipation, flank pain, dysuria, hematuria, urinary  Frequency, nocturia, numbness, tingling, seizures,  Focal weakness, Loss of consciousness,  Tremor, insomnia,  anxiety, and suicidal ideation.      Objective:  BP 140/84 (BP Location: Left Arm, Patient Position: Sitting, Cuff Size: Large)   Pulse (!) 57   Temp (!) 96.4 F (35.8 C) (Temporal)   Ht 6\' 3"  (1.905 m)   Wt (!) 380 lb 9.6 oz (172.6 kg)   SpO2 95%   BMI 47.57 kg/m   BP Readings from Last 3 Encounters:  06/17/21 140/84  06/17/21 138/88  04/23/21 (!) 150/92    Wt Readings from Last 3 Encounters:  06/17/21 (!) 380 lb 9.6 oz (172.6 kg)  06/17/21 (!) 381 lb (172.8 kg)  04/23/21 (!) 373 lb (169.2 kg)    General appearance: alert, cooperative and appears stated age. Morbidly obese. Ears: normal TM's and external ear canals both ears Throat: lips, mucosa, and tongue normal; teeth and gums normal Neck: no adenopathy, no carotid bruit, supple, symmetrical, trachea midline and thyroid not enlarged, symmetric, no tenderness/mass/nodules Back: symmetric, no curvature. ROM normal. No CVA tenderness. Lungs: clear to auscultation bilaterally Heart: regular rate and rhythm, S1, S2 normal, no murmur, click, rub or gallop Abdomen:  soft, non-tender; bowel sounds normal; no masses,  no organomegaly Pulses: 2+ and symmetric Skin: Skin color, texture, turgor normal. No rashes or lesions Lymph nodes: Cervical, supraclavicular, and axillary nodes normal.  Lab Results  Component Value Date   HGBA1C 5.9 03/11/2021   HGBA1C 5.6 05/29/2020   HGBA1C 5.6 03/29/2018    Lab Results  Component Value Date   CREATININE 1.13 03/11/2021   CREATININE 1.07 09/29/2020   CREATININE 1.09 03/07/2020    Lab Results  Component Value Date   WBC 7.3 09/29/2020   HGB 15.2 09/29/2020   HCT 44.9 09/29/2020   PLT 160 09/29/2020   GLUCOSE 96 03/11/2021   CHOL 163 09/29/2020  TRIG 94 09/29/2020   HDL 45 09/29/2020   LDLCALC 101 (H) 09/29/2020   ALT 23 03/11/2021   AST 23 03/11/2021   NA 146 (H) 03/11/2021   K 4.1 03/11/2021   CL 108 03/11/2021   CREATININE 1.13 03/11/2021   BUN 23 03/11/2021   CO2 21 03/11/2021   TSH 4.25 03/11/2021   INR 1.21 06/09/2017   HGBA1C 5.9 03/11/2021    No results found.  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   I have refilled his hydrocodone for  Use not more than 3 times daily for three months  and advised him to continue to maximize his tylenol use to 2000 mg daily. .  NSAIDs are contraindicated due to history of gastric bypass and use of anticoagulation for embolic stroke risk reduction       Relevant Medications   HYDROcodone-acetaminophen (NORCO) 10-325 MG tablet   Morbid obesity (HCC)    Despite bariatric surgery remotely.  He has untreated sleep apnea (by  preference),  DJD of multiple weight bearing joints . Discussed use of Mounjaro to facilitate weight loss through appetite suppression. Referral to CCM       Persistent atrial fibrillation (HCC) - Primary   Relevant Orders   AMB Referral to Community Care Coordinaton   Major depressive disorder with current active episode    Symptoms were aggravated by unintentional reduction in wellbutrin dose 2 weeks ago due to insurance requiring PA of his 450 mg stable dose of medication. (He did not notify office until today)  Increasing dose today to 300 mg and PA started.       Relevant Medications   buPROPion (WELLBUTRIN XL) 300 MG 24 hr tablet   Acquired thrombophilia (HCC)    Managed with Pradaxa  for embolic stroke risk mitigation due to  atrial fibrillation. Patient has no signs of bleeding and is advised to notify his specialists prior to any procedure that may required suspension of Pradaxa. He has been advised to contact his cardiologist to discuss alternative covered anticoagulation  Lab Results  Component Value Date   WBC 7.3 09/29/2020   HGB 15.2 09/29/2020   HCT 44.9 09/29/2020   MCV 92 09/29/2020   PLT 160 09/29/2020          I spent 40 mintutes dedicated to the care of this patient on the date of this encounter to include pre-visit review of his medical history,  Face-to-face time with the patient , and post visit ordering of testing and therapeutics.  Meds ordered this encounter  Medications   buPROPion (WELLBUTRIN XL) 300 MG 24 hr tablet    Sig: Take 1 tablet (300 mg total) by mouth daily.    Dispense:  90 tablet    Refill:  1   HYDROcodone-acetaminophen (NORCO) 10-325 MG tablet    Sig: Take 1 tablet by mouth every 8 (eight) hours as needed.    Dispense:  90 tablet    Refill:  0    Medications Discontinued During  This Encounter  Medication Reason   buPROPion (WELLBUTRIN XL) 150 MG 24 hr tablet    HYDROcodone-acetaminophen (NORCO) 10-325 MG tablet Reorder     Follow-up: Return in about 3 months (around 09/16/2021).   Sherlene Shamseresa L Debie Ashline, MD

## 2021-06-17 NOTE — Patient Instructions (Signed)
Keep doing water aerobics See me in 4 months

## 2021-06-17 NOTE — Chronic Care Management (AMB) (Signed)
  Chronic Care Management   Note  06/17/2021 Name: Johnathan [name] MRN: [id number] DOB: Sep 21, 1956  Johnathan [name] is a 65 y.o. year old male who is a primary care patient of Derrel Nip, Aris Everts, MD. I reached out to Ladona Mow by phone today in response to a referral sent by Johnathan [name]'s PCP, Crecencio Mc, MD      Johnathan [name] was given information about Chronic Care Management services today including:  CCM service includes personalized support from designated clinical staff supervised by his physician, including individualized plan of care and coordination with other care providers 24/7 contact phone numbers for assistance for urgent and routine care needs. Service will only be billed when office clinical staff spend 20 minutes or more in a month to coordinate care. Only one practitioner may furnish and bill the service in a calendar month. The patient may stop CCM services at any time (effective at the end of the month) by phone call to the office staff. The patient will be responsible for cost sharing (co-pay) of up to 20% of the service fee (after annual deductible is met).  Patient agreed to services and verbal consent obtained.   Follow up plan: Telephone appointment with care management team member scheduled for:06/26/2021  Noreene Larsson, [address], [place], [place] [address] Direct Dial: [phone] [email or web address]

## 2021-06-17 NOTE — Assessment & Plan Note (Signed)
Patient likely is doing significantly better.  Feels like he is encouraged at the progress he is made at the moment.  Discussed with patient though I do feel that there is still significant weakness of the feet.  Patient states that this has been going on longer than the back.  Patient would not like to make any changes.  Encourage patient to continue to work on the balance and coordination in the aquatic therapy to help him decrease the amount of frequent falls.  Follow-up with me again in 3 to 4 months otherwise.  Patient knows to call us if any worsening symptoms occur.

## 2021-06-17 NOTE — Assessment & Plan Note (Signed)
History of severe neuropathy but I do think some of it could be secondary to the back.  Discussed again about potentially advanced imaging which patient declined at the moment.  Feels like he is doing relatively well.  Does not want any further work-up at this point because he does not feel it would change medical management with him making improvements.

## 2021-06-17 NOTE — Patient Instructions (Addendum)
For prevention of gas produced by brussels sprouts,  broccoli ,  cabbage  USE BEANO AT MEALTIME   INCREASE THE WELLBUTRIN TO 300 MG .  PA FOR 450 MG DOSE IN PROCESS    I am letting you know that I am referring to our clinical pharmacist ,  [name].  Catie helps me provide additional services to my patients who are on Medicare and dealing with  chronic diseases , like diabetes, atrial fibrillation,  and obesity .   I do not expect this referral to cost you anything out of pocket, but I do think Catie will be able to help you get your weight down  with medicatio n and GET YOU AN ALTERNATIVE TO PRADAXA .  She will make contact with  You by phone in the next week   The water aerobics is CRUCIAL!   I ENCOURAGE YOU TO ADD THE THIRD DAY.

## 2021-06-20 NOTE — Assessment & Plan Note (Addendum)
Symptoms were aggravated by unintentional reduction in wellbutrin dose 2 weeks ago due to insurance requiring PA of his 450 mg stable dose of medication. (He did not notify office until today)  Increasing dose today to 300 mg and PA started.

## 2021-06-20 NOTE — Assessment & Plan Note (Addendum)
Despite bariatric surgery remotely.  He has untreated sleep apnea (by preference),  DJD of multiple weight bearing joints . Discussed use of Mounjaro to facilitate weight loss through appetite suppression. Referral to CCM

## 2021-06-20 NOTE — Assessment & Plan Note (Signed)
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   I have refilled his hydrocodone for  Use not more than 3 times daily for three months  and advised him to continue to maximize his tylenol use to 2000 mg daily. .  NSAIDs are contraindicated due to history of gastric bypass and use of anticoagulation for embolic stroke risk reduction

## 2021-06-20 NOTE — Assessment & Plan Note (Signed)
Managed with Pradaxa  for embolic stroke risk mitigation due to  atrial fibrillation. Patient has no signs of bleeding and is advised to notify his specialists prior to any procedure that may required suspension of Pradaxa. He has been advised to contact his cardiologist to discuss alternative covered anticoagulation  Lab Results  Component Value Date   WBC 7.3 09/29/2020   HGB 15.2 09/29/2020   HCT 44.9 09/29/2020   MCV 92 09/29/2020   PLT 160 09/29/2020

## 2021-06-26 ENCOUNTER — Ambulatory Visit (INDEPENDENT_AMBULATORY_CARE_PROVIDER_SITE_OTHER): Payer: Medicare HMO | Admitting: Pharmacist

## 2021-06-26 DIAGNOSIS — F331 Major depressive disorder, recurrent, moderate: Secondary | ICD-10-CM

## 2021-06-26 DIAGNOSIS — I10 Essential (primary) hypertension: Secondary | ICD-10-CM

## 2021-06-26 DIAGNOSIS — E7849 Other hyperlipidemia: Secondary | ICD-10-CM

## 2021-06-26 DIAGNOSIS — I4819 Other persistent atrial fibrillation: Secondary | ICD-10-CM

## 2021-06-26 DIAGNOSIS — R7301 Impaired fasting glucose: Secondary | ICD-10-CM

## 2021-06-26 NOTE — Chronic Care Management (AMB) (Signed)
Chronic Care Management Pharmacy Note  06/26/2021 Name:  Johnathan [name] MRN:  [id number] DOB:  [birth year]   Subjective: Johnathan [name] is an 65 y.o. year old male who is a primary patient of [name], Aris Everts, MD.  The CCM team was consulted for assistance with disease management and care coordination needs.    Engaged with patient by telephone for initial visit in response to provider referral for pharmacy case management and/or care coordination services. His wife, Juliann Pulse, was also present for the visit.  Consent to Services:  The patient was given the following information about Chronic Care Management services today, agreed to services, and gave verbal consent: 1. CCM service includes personalized support from designated clinical staff supervised by the primary care provider, including individualized plan of care and coordination with other care providers 2. 24/7 contact phone numbers for assistance for urgent and routine care needs. 3. Service will only be billed when office clinical staff spend 20 minutes or more in a month to coordinate care. 4. Only one practitioner may furnish and bill the service in a calendar month. 5.The patient may stop CCM services at any time (effective at the end of the month) by phone call to the office staff. 6. The patient will be responsible for cost sharing (co-pay) of up to 20% of the service fee (after annual deductible is met). Patient agreed to services and consent obtained.  Patient Care Team: Crecencio Mc, MD as PCP - General (Internal Medicine) Minna Merritts, MD as Consulting Physician (Cardiology) De Hollingshead, RPH-CPP (Pharmacist)  Objective:  Lab Results  Component Value Date   CREATININE 1.13 03/11/2021   CREATININE 1.07 09/29/2020   CREATININE 1.09 03/07/2020    Lab Results  Component Value Date   HGBA1C 5.9 03/11/2021   Last diabetic Eye exam: No results found for: HMDIABEYEEXA  Last diabetic Foot exam: No results  found for: HMDIABFOOTEX      Component Value Date/Time   CHOL 163 09/29/2020 1150   TRIG 94 09/29/2020 1150   HDL 45 09/29/2020 1150   CHOLHDL 3.6 09/29/2020 1150   LDLCALC 101 (H) 09/29/2020 1150    Hepatic Function Latest Ref Rng & Units 03/11/2021 09/29/2020 03/07/2020  Total Protein 6.0 - 8.3 g/dL 7.1 7.1 7.1  Albumin 3.5 - 5.2 g/dL 4.4 4.2 4.4  AST 0 - 37 U/L _0 ALT 0 - 53 U/L _1 Alk Phosphatase 39 - 117 U/L 104 104 105  Total Bilirubin 0.2 - 1.2 mg/dL 0.3 0.4 0.5  Bilirubin, Direct 0.00 - 0.40 mg/dL - - -    Lab Results  Component Value Date/Time   TSH 4.25 03/11/2021 03:56 PM   TSH 3.780 09/29/2020 11:50 AM    CBC Latest Ref Rng & Units 09/29/2020 09/04/2019 12/27/2017  WBC 3.4 - 10.8 x10E3/uL 7.3 6.8 8.7  Hemoglobin 13.0 - 17.7 g/dL 15.2 14.2 14.6  Hematocrit 37.5 - 51.0 % 44.9 42.6 42.6  Platelets 150 - 450 x10E3/uL 160 165.0 193    Lab Results  Component Value Date/Time   VD25OH 30.81 09/04/2019 02:41 PM   VD25OH 36.8 09/15/2016 08:11 AM   VD25OH 35.8 03/29/2016 08:24 AM    Clinical ASCVD: No  The 10-year ASCVD risk score (Arnett DK, et al., 2019) is: 15.8%   Values used to calculate the score:     Age: 24 years     Sex: Male     Is Non-Hispanic African American: No  Diabetic: No     Tobacco smoker: No     Systolic Blood Pressure: 161 mmHg     Is BP treated: Yes     HDL Cholesterol: 45 mg/dL     Total Cholesterol: 163 mg/dL     Social History   Tobacco Use  Smoking Status Never  Smokeless Tobacco Never   BP Readings from Last 3 Encounters:  06/17/21 140/84  06/17/21 138/88  04/23/21 (!) 150/92   Pulse Readings from Last 3 Encounters:  06/17/21 (!) 57  06/17/21 (!) 58  04/23/21 66   Wt Readings from Last 3 Encounters:  06/17/21 (!) 380 lb 9.6 oz (172.6 kg)  06/17/21 (!) 381 lb (172.8 kg)  04/23/21 (!) 373 lb (169.2 kg)    Assessment: Review of patient past medical history, allergies, medications, health status,  including review of consultants reports, laboratory and other test data, was performed as part of comprehensive evaluation and provision of chronic care management services.   SDOH:  (Social Determinants of Health) assessments and interventions performed:  SDOH Interventions    Flowsheet Row Most Recent Value  SDOH Interventions   Financial Strain Interventions Other (Comment)  [patient assistance]       CCM Care Plan  No Known Allergies  Medications Reviewed Today     Reviewed by De Hollingshead, RPH-CPP (Pharmacist) on 06/26/21 at 1033  Med List Status: <None>   Medication Order Taking? Sig Documenting Provider Last Dose Status Informant  amiodarone (PACERONE) 200 MG tablet [id number] Yes TAKE 1 TABLET BY MOUTH  TWICE DAILY Gollan, Kathlene November, MD Taking Active   B Complex Vitamins (B COMPLEX 50 PO) [id number] Yes Take 1 tablet by mouth daily.  [provider] Taking Active Self  buPROPion (WELLBUTRIN XL) 300 MG 24 hr tablet [id number] Yes Take 1 tablet (300 mg total) by mouth daily. Crecencio Mc, MD Taking Active   Cholecalciferol (VITAMIN D3) 2000 UNITS TABS [id number] Yes Take 1 tablet by mouth daily.  [provider] Taking Active Self  dabigatran (PRADAXA) 150 MG CAPS capsule [id number] Yes TAKE 1 CAPSULE BY MOUTH TWICE DAILY Gollan, Kathlene November, MD Taking Active   docusate sodium (COLACE) 100 MG capsule [id number] Yes Take 400 mg by mouth daily.  [provider] Taking Active Self           Med Note Rosemarie Beath, MELISSA B   Wed Jun 08, 2017  4:21 PM)    Ferrous Gluconate 324 (37.5 Fe) MG TABS [id number] Yes Take 1 tablet by mouth daily.  [provider] Taking Active Self  furosemide (LASIX) 40 MG tablet [id number] Yes Take 1 tablet (40 mg total) by mouth daily as needed. Minna Merritts, MD Taking Active   gabapentin (NEURONTIN) 300 MG capsule [id number] Yes Take 1 capsule (300 mg total) by mouth 3 (three) times daily. Lyndal Pulley, DO Taking Active    GLUCOSAMINE HCL-MSM PO [id number] Yes Take 1 tablet by mouth daily.  [provider] Taking Active Self  HYDROcodone-acetaminophen (NORCO) 10-325 MG tablet [id number] Yes Take 1 tablet by mouth every 8 (eight) hours as needed. Crecencio Mc, MD Taking Active   levothyroxine (SYNTHROID) 25 MCG tablet [id number] Yes Take 1 tablet (25 mcg total) by mouth daily before breakfast. Minna Merritts, MD Taking Active   losartan-hydrochlorothiazide [facility]) 100-25 MG tablet [id number] Yes TAKE 1 TABLET BY MOUTH EVERY DAY Gollan, Kathlene November, MD Taking Active   metoprolol succinate (TOPROL-XL) 25 MG 24 hr tablet [id number] Yes TAKE  1 TABLET BY MOUTH  DAILY Crecencio Mc, MD Taking Active   Multiple Vitamin (MULTIVITAMIN) tablet [id number] Yes Take 1 tablet by mouth daily. [provider] Taking Active Self  nitroGLYCERIN (NITROSTAT) 0.4 MG SL tablet [id number] No Place 1 tablet (0.4 mg total) under the tongue every 5 (five) minutes as needed for chest pain.  Patient not taking: Reported on 06/26/2021   Minna Merritts, MD Not Taking Active   Potassium Chloride ER 20 MEQ TBCR [id number] Yes Take 20 mEq by mouth 2 (two) times daily as needed. Minna Merritts, MD Taking Active   propranolol (INDERAL) 20 MG tablet [id number] No Take 1 tablet (20 mg total) by mouth 3 (three) times daily as needed. Reported on 03/22/2016  Patient not taking: Reported on 06/26/2021   Minna Merritts, MD Not Taking Active   venlafaxine XR (EFFEXOR-XR) 75 MG 24 hr capsule [id number] Yes Take 1 capsule (75 mg total) by mouth daily with breakfast. Crecencio Mc, MD Taking Active   vitamin B-12 (CYANOCOBALAMIN) 500 MCG tablet [id number] Yes Take 500 mcg by mouth daily. [provider] Taking Active Self  vitamin E 400 UNIT capsule [id number] Yes Take 400 Units by mouth daily. [provider] Taking Active Self  Med List Note Leeanne Rio, [place] 03/29/17 1648): **[facility]**            Patient Active  Problem List   Diagnosis Date Noted   Lumbar radiculopathy 04/03/2021   Acquired thrombophilia ([place]) 10/02/2020   Encounter for preventive health examination 05/31/2020   Frequent falls 07/01/2019   Urinary frequency 12/28/2017   Major depressive disorder with current active episode 09/26/2017   S/P total knee arthroplasty 06/20/2017   Preoperative evaluation to rule out surgical contraindication 08/02/2016   Chronic venous insufficiency 06/23/2016   Acquired hypothyroidism 04/04/2016   Long-term use of high-risk medication 03/22/2016   Hyperlipidemia 03/22/2016   Pain in joint, ankle and foot 12/04/2015   Persistent atrial fibrillation (HCC)    Systolic dysfunction 73/41/9379   Bilateral leg edema 05/21/2015   OSA (obstructive sleep apnea)    History of DVT (deep vein thrombosis)    B12 deficiency    S/P left unicompartmental knee replacement 02/05/2015   Routine general medical examination at a health care facility 11/08/2014   Morbid obesity ([place]) 08/03/2014   Neck pain 05/13/2014   Rotator cuff insufficiency of left shoulder 05/13/2014   Essential hypertension 10/17/2013   Polyarthralgia 07/04/2013   Neuropathy 07/04/2013   S/P bariatric surgery 07/04/2013   Degenerative joint disease involving multiple joints on both sides of body 07/10/2012    Immunization History  Administered Date(s) Administered   Influenza Split 07/10/2012   Influenza,inj,Quad PF,6+ Mos 07/04/2013   Influenza-Unspecified 08/04/2016, 07/25/2017   PFIZER(Purple Top)SARS-COV-2 Vaccination 07/09/2020, 07/30/2020   PNEUMOCOCCAL CONJUGATE-20 01/05/2021   Tdap 07/10/2012, 03/10/2015    Conditions to be addressed/monitored: Atrial Fibrillation, HTN, HLD, and Obesity  Care Plan : Medication Management  Updates made by De Hollingshead, RPH-CPP since 06/26/2021 12:00 AM     Problem: AFib, Hypertension, Obesity      Long-Range Goal: Disease Progression Prevention   Start Date: 06/26/2021  This  Visit's Progress: On track  Priority: High  Note:   Current Barriers:  Unable to independently afford treatment regimen Unable to achieve control of weight   Pharmacist Clinical Goal(s):  Over the next 90 days, patient will verbalize ability to afford treatment regimen Over the next 90 days, patient will achieve improvement  in weight  through collaboration with PharmD and provider.   Interventions: 1:1 collaboration with Crecencio Mc, MD regarding development and update of comprehensive plan of care as evidenced by provider attestation and co-signature Inter-disciplinary care team collaboration (see longitudinal plan of care) Comprehensive medication review performed; medication list updated in electronic medical record  Hyperlipidemia and ASCVD risk reduction:  Untreated; current treatment: none;  10 year ASCVD risk: 16% Recommend moderate - high intensity statin for primary prevention. Will discuss moving forward.   Atrial Fibrillation w CHF Appropriately managed;  Rate control: metoprolol succinate 25 mg daily, propranolol 20 mg TID PRN - has not needed recently Rhythm control: amiodarone 200 mg BI Anticoagulant treatment: Pradaxa 150 mg BID- Reports history of Xarelto and Eliquis - both caused allergic reactions Additional antihypertensives: losartan/HCTZ 100/25 mg daily Diuretic: furosemide 40 mg daily PRN, potassium 20 mEq BID CHADS2VASc score: 3 (age, HTN, CHF) Discussed cost of Pradaxa now that patient is on Eliquis. Reviewed [employer] formulary- appears Pradaxa is Tier 4. Could consider a Tier Exception to [employer] as patient has tried and failed the preferred alternatives of Eliquis and Xarelto. Will collaborate w/ Dr. Donivan Scull office on considering this option.  Discussed increased risk of GI bleed with Pradaxa. Discussed interaction with amiodarone possibility increasing Pradaxa concentrations and risk of bleeding. Risk more so in impaired renal impairment. Discussed to avoid  NSAIDs and other medications that increase bleed risk.   Depression/Anxiety: Controlled per patient report; current treatment: venlafaxine 75 mg daily, bupropion XL 300 mg daily;  Recommended to continue current regimen at this time  Overweight/Obesity Complicated by Afib, CHF: Unable to achieve goal weight loss through lifestyle modification alone; current treatment:none;  Medications/Strategies previously tried: history of bariatric surgery in 2014 Baseline weight: 380 lbs Current meal patterns: breakfast: bagel; lunch: chicken salad sandwich; dinner: meat and vegetables usually; snacks: cheesy popcorn, crackers, likes to graze on leftovers Current exercise: goes to the pool 3 days weekly, works out in the water 45-60 minutes Extensive dietary counseling including education on focus on lean proteins, fruits and vegetables, whole grains and increased fiber consumption, adequate hydration Extensive exercise counseling including eventual goal of 150 minutes of moderate intensity exercise weekly Counseled on GLP1 agonists, including mechanism of action, side effects, and benefits. No personal or family history of medullary thyroid cancer, personal history of pancreatitis or gallbladder disease. Counseled on potential side effects of nausea, stomach upset, queasiness, constipation, and that these generally improve over time. Advised to contact our office with more severe symptoms, including nausea, diarrhea, stomach pain. Patient verbalized understanding. Per provider referral, discussed Darcel Bayley, however, Darcel Bayley is not currently available for Medicare patients. Discussed Ozempic. Patient interested. Will provide sample to start. Start Ozempic 0.25 mg weekly for 4 weeks then increase to 0.5 mg weekly Discussed income. Patient will qualify for assistance through [employer]. Will collaborate w/ provider and CPhT to pursue assistance.   Hypothyroidism: Controlled per last lab work; current regimen:  levothyroxine 75 mcg daily Continue to monitor due to concurrent amiodarone therapy. Recommended to continue current regimen at this time  Chronic Pain: Uncontrolled per last provider visit; current regimen: hydrocodone/acetaminophen 10/325 mg TID, docusate 100 mg daily PRN constipation, gabapentin 300 mg TID  Continue to monitor for sedation, falls risk especially with concurrent anticoagulant therapy. Praised for incorporation of water therapy. Continue current regimen along with physical activity as able.   Supplements: Multivitamin, glucosamine, Vitamin b12, ferrous gluconate daily, b complex vitamin, Vitamin D No drug interactions noted at this  time. Advised to always ask a provider or pharmacist about addition of medications to ensure no interactions  Patient Goals/Self-Care Activities Over the next 90 days, patient will:  - take medications as prescribed target a minimum of 150 minutes of moderate intensity exercise weekly engage in dietary modifications by moderating carbohydrate portion sizes  Follow Up Plan: Telephone follow up appointment with care management team member scheduled for: 6 weeks     Care Plan : General Pharmacy (Adult)  Updates made by De Hollingshead, RPH-CPP since 06/26/2021 12:00 AM      Medication Assistance: Application for Ozempic  medication assistance program. in process.  Anticipated assistance start date TBD.  See plan of care for additional detail.  Patient's preferred pharmacy is:  [facility] [address] [place] [address] Phone: [phone] Fax: [phone]  [id number] [id number]: [phone]: [phone] Follow Up:  Patient agrees to Care Plan and Follow-up.  Plan: Telephone follow up appointment with care management team member scheduled for:  6  weeks  Catie TDarnelle Maffucci PharmD, BFort Davis CMortonClinical Pharmacist LOccidental Petroleumat BJohnson & [phone]

## 2021-06-26 NOTE — Patient Instructions (Signed)
Visit Information   PATIENT GOALS:   Goals Addressed               This Visit's Progress     Patient Stated     Medication Monitoring (pt-stated)        Patient Goals/Self-Care Activities Over the next 90 days, patient will:  - take medications as prescribed target a minimum of 150 minutes of moderate intensity exercise weekly engage in dietary modifications by moderating carbohydrate portion sizes        Consent to CCM Services: Johnathan [name] was given information about Chronic Care Management services including:  CCM service includes personalized support from designated clinical staff supervised by his physician, including individualized plan of care and coordination with other care providers 24/7 contact phone numbers for assistance for urgent and routine care needs. Service will only be billed when office clinical staff spend 20 minutes or more in a month to coordinate care. Only one practitioner may furnish and bill the service in a calendar month. The patient may stop CCM services at any time (effective at the end of the month) by phone call to the office staff. The patient will be responsible for cost sharing (co-pay) of up to 20% of the service fee (after annual deductible is met).  Patient agreed to services and verbal consent obtained.   Patient verbalizes understanding of instructions provided today and agrees to view in [place].    Plan: Telephone follow up appointment with care management team member scheduled for:  6 weeks  Catie Darnelle Maffucci, PharmD, [place], CPP Clinical Pharmacist [place] at [facility] [place]: Patient Care Plan: Medication Management     Problem Identified: AFib, Hypertension, Obesity      Long-Range Goal: Disease Progression Prevention   Start Date: 06/26/2021  This Visit's Progress: On track  Priority: High  Note:   Current Barriers:  Unable to independently afford treatment regimen Unable  to achieve control of weight   Pharmacist Clinical Goal(s):  Over the next 90 days, patient will verbalize ability to afford treatment regimen Over the next 90 days, patient will achieve improvement in weight  through collaboration with PharmD and provider.   Interventions: 1:1 collaboration with Crecencio Mc, MD regarding development and update of comprehensive plan of care as evidenced by provider attestation and co-signature Inter-disciplinary care team collaboration (see longitudinal plan of care) Comprehensive medication review performed; medication list updated in electronic medical record  Hyperlipidemia and ASCVD risk reduction:  Untreated; current treatment: none;  10 year ASCVD risk: 16% Recommend moderate - high intensity statin for primary prevention. Will discuss moving forward.   Atrial Fibrillation w CHF Appropriately managed;  Rate control: metoprolol succinate 25 mg daily, propranolol 20 mg TID PRN - has not needed recently Rhythm control: amiodarone 200 mg BI Anticoagulant treatment: Pradaxa 150 mg BID- Reports history of Xarelto and Eliquis - both caused allergic reactions Additional antihypertensives: losartan/HCTZ 100/25 mg daily Diuretic: furosemide 40 mg daily PRN, potassium 20 mEq BID CHADS2VASc score: 3 (age, HTN, CHF) Discussed cost of Pradaxa now that patient is on Eliquis. Reviewed [employer] formulary- appears Pradaxa is Tier 4. Could consider a Tier Exception to [employer] as patient has tried and failed the preferred alternatives of Eliquis and Xarelto. Will collaborate w/ Dr. Donivan Scull office on considering this option.  Discussed increased risk of GI bleed with Pradaxa. Discussed interaction with amiodarone possibility increasing Pradaxa concentrations and risk of bleeding. Risk more so in impaired renal impairment. Discussed to  avoid NSAIDs and other medications that increase bleed risk.   Depression/Anxiety: Controlled per patient report; current treatment:  venlafaxine 75 mg daily, bupropion XL 300 mg daily;  Recommended to continue current regimen at this time  Overweight/Obesity Complicated by Afib, CHF: Unable to achieve goal weight loss through lifestyle modification alone; current treatment:none;  Medications/Strategies previously tried: history of bariatric surgery in 2014 Baseline weight: 380 lbs Current meal patterns: breakfast: bagel; lunch: chicken salad sandwich; dinner: meat and vegetables usually; snacks: cheesy popcorn, crackers, likes to graze on leftovers Current exercise: goes to the pool 3 days weekly, works out in the water 45-60 minutes Extensive dietary counseling including education on focus on lean proteins, fruits and vegetables, whole grains and increased fiber consumption, adequate hydration Extensive exercise counseling including eventual goal of 150 minutes of moderate intensity exercise weekly Counseled on GLP1 agonists, including mechanism of action, side effects, and benefits. No personal or family history of medullary thyroid cancer, personal history of pancreatitis or gallbladder disease. Counseled on potential side effects of nausea, stomach upset, queasiness, constipation, and that these generally improve over time. Advised to contact our office with more severe symptoms, including nausea, diarrhea, stomach pain. Patient verbalized understanding. Per provider referral, discussed Darcel Bayley, however, Darcel Bayley is not currently available for Medicare patients. Discussed Ozempic. Patient interested. Will provide sample to start. Start Ozempic 0.25 mg weekly for 4 weeks then increase to 0.5 mg weekly Discussed income. Patient will qualify for assistance through [employer]. Will collaborate w/ provider and CPhT to pursue assistance.   Hypothyroidism: Controlled per last lab work; current regimen: levothyroxine 75 mcg daily Continue to monitor due to concurrent amiodarone therapy. Recommended to continue current regimen at  this time  Chronic Pain: Uncontrolled per last provider visit; current regimen: hydrocodone/acetaminophen 10/325 mg TID, docusate 100 mg daily PRN constipation, gabapentin 300 mg TID  Continue to monitor for sedation, falls risk especially with concurrent anticoagulant therapy. Praised for incorporation of water therapy. Continue current regimen along with physical activity as able.   Supplements: Multivitamin, glucosamine, Vitamin b12, ferrous gluconate daily, b complex vitamin, Vitamin D No drug interactions noted at this time. Advised to always ask a provider or pharmacist about addition of medications to ensure no interactions  Patient Goals/Self-Care Activities Over the next 90 days, patient will:  - take medications as prescribed target a minimum of 150 minutes of moderate intensity exercise weekly engage in dietary modifications by moderating carbohydrate portion sizes  Follow Up Plan: Telephone follow up appointment with care management team member scheduled for: 6 weeks     Patient Care Plan: General Pharmacy (Adult)

## 2021-06-30 ENCOUNTER — Telehealth: Payer: Self-pay | Admitting: Pharmacy Technician

## 2021-06-30 ENCOUNTER — Telehealth: Payer: Self-pay | Admitting: Pharmacist

## 2021-06-30 ENCOUNTER — Ambulatory Visit: Payer: Medicare HMO

## 2021-06-30 DIAGNOSIS — Z596 Low income: Secondary | ICD-10-CM

## 2021-06-30 MED ORDER — OZEMPIC (0.25 OR 0.5 MG/DOSE) 2 MG/1.5ML ~~LOC~~ SOPN: PEN_INJECTOR | SUBCUTANEOUS | 0 refills | Status: DC

## 2021-06-30 NOTE — Telephone Encounter (Signed)
Medication Samples have been labeled and logged for patient to pick up at his nurse visit  Drug name: Ozempic       Strength:  2mg  /1.5 mL        Qty: 1 pen  LOT:  Exp.Date: 12/09/2023  Dosing instructions: Inject 0.25 mg weekly for 4 weeks then increase to 0.5 mg weekly  The patient has been instructed regarding the correct time, dose, and frequency of taking this medication, including desired effects and most common side effects.   12/11/2023 8:26 AM 06/30/2021

## 2021-06-30 NOTE — Progress Notes (Addendum)
[facility] Customer service manager [facility])                                                [facility] Quality Pharmacy Team    06/30/2021  MARIN MILLEY Aug 26, 1956 [id number]                                                                    Medication Assistance Referral  Referral From: [facility] Embedded RPh Catie T.   Medication/Company: [employer] / [employer] Patient application portion:  Mailed Provider application portion:  N/A Embedded RPh  to Dr. Duncan Dull Provider address/fax verified via: Office website  Kinslea Frances P. Keierra Nudo, CPhT [facility] [employer]  [phone]

## 2021-06-30 NOTE — Addendum Note (Signed)
Addended by: Lourena Simmonds on: 06/30/2021 08:25 AM   Modules accepted: Orders

## 2021-07-01 NOTE — Telephone Encounter (Signed)
Called patient. He and his wife forgot about the nurse visit yesterday. Rescheduled for next week.

## 2021-07-03 NOTE — Progress Notes (Signed)
Spoke with [name] regarding the tier exemption for Pradaxa, the patient stated, he has an appointment with Florentina Addison at Dr. Melina Schools office to discuss this matter on getting a tier exemption approved for the Pradaxa. The patient will contact our office back if there is anything we can help him with.

## 2021-07-08 ENCOUNTER — Ambulatory Visit: Payer: Medicare HMO

## 2021-07-08 ENCOUNTER — Other Ambulatory Visit: Payer: Self-pay

## 2021-07-08 ENCOUNTER — Ambulatory Visit: Payer: Medicare HMO | Admitting: Pharmacist

## 2021-07-08 DIAGNOSIS — I4819 Other persistent atrial fibrillation: Secondary | ICD-10-CM

## 2021-07-08 DIAGNOSIS — R7301 Impaired fasting glucose: Secondary | ICD-10-CM

## 2021-07-08 DIAGNOSIS — F331 Major depressive disorder, recurrent, moderate: Secondary | ICD-10-CM

## 2021-07-08 NOTE — Chronic Care Management (AMB) (Signed)
Chronic Care Management Pharmacy Note  07/08/2021 Name:  Johnathan [name] MRN:  [id number] DOB:  05/19/1956   Subjective: Johnathan [name] is an 65 y.o. year old male who is a primary patient of Derrel Nip, Aris Everts, MD.  The CCM team was consulted for assistance with disease management and care coordination needs.    Engaged with patient face to face for follow up visit in response to provider referral for pharmacy case management and/or care coordination services.   Consent to Services:  The patient was given information about Chronic Care Management services, agreed to services, and gave verbal consent prior to initiation of services.  Please see initial visit note for detailed documentation.   Patient Care Team: Crecencio Mc, MD as PCP - General (Internal Medicine) Minna Merritts, MD as Consulting Physician (Cardiology) De Hollingshead, RPH-CPP (Pharmacist)   Objective:  Lab Results  Component Value Date   CREATININE 1.13 03/11/2021   CREATININE 1.07 09/29/2020   CREATININE 1.09 03/07/2020    Lab Results  Component Value Date   HGBA1C 5.9 03/11/2021   Last diabetic Eye exam: No results found for: HMDIABEYEEXA  Last diabetic Foot exam: No results found for: HMDIABFOOTEX      Component Value Date/Time   CHOL 163 09/29/2020 1150   TRIG 94 09/29/2020 1150   HDL 45 09/29/2020 1150   CHOLHDL 3.6 09/29/2020 1150   LDLCALC 101 (H) 09/29/2020 1150    Hepatic Function Latest Ref Rng & Units 03/11/2021 09/29/2020 03/07/2020  Total Protein 6.0 - 8.3 g/dL 7.1 7.1 7.1  Albumin 3.5 - 5.2 g/dL 4.4 4.2 4.4  AST 0 - 37 U/L _0 ALT 0 - 53 U/L _1 Alk Phosphatase 39 - 117 U/L 104 104 105  Total Bilirubin 0.2 - 1.2 mg/dL 0.3 0.4 0.5  Bilirubin, Direct 0.00 - 0.40 mg/dL - - -    Lab Results  Component Value Date/Time   TSH 4.25 03/11/2021 03:56 PM   TSH 3.780 09/29/2020 11:50 AM    CBC Latest Ref Rng & Units 09/29/2020 09/04/2019 12/27/2017  WBC 3.4 -  10.8 x10E3/uL 7.3 6.8 8.7  Hemoglobin 13.0 - 17.7 g/dL 15.2 14.2 14.6  Hematocrit 37.5 - 51.0 % 44.9 42.6 42.6  Platelets 150 - 450 x10E3/uL 160 165.0 193    Lab Results  Component Value Date/Time   VD25OH 30.81 09/04/2019 02:41 PM   VD25OH 36.8 09/15/2016 08:11 AM   VD25OH 35.8 03/29/2016 08:24 AM    Social History   Tobacco Use  Smoking Status Never  Smokeless Tobacco Never   BP Readings from Last 3 Encounters:  06/17/21 140/84  06/17/21 138/88  04/23/21 (!) 150/92   Pulse Readings from Last 3 Encounters:  06/17/21 (!) 57  06/17/21 (!) 58  04/23/21 66   Wt Readings from Last 3 Encounters:  06/17/21 (!) 380 lb 9.6 oz (172.6 kg)  06/17/21 (!) 381 lb (172.8 kg)  04/23/21 (!) 373 lb (169.2 kg)    Assessment: Review of patient past medical history, allergies, medications, health status, including review of consultants reports, laboratory and other test data, was performed as part of comprehensive evaluation and provision of chronic care management services.   SDOH:  (Social Determinants of Health) assessments and interventions performed:  SDOH Interventions    Flowsheet Row Most Recent Value  SDOH Interventions   Financial Strain Interventions Other (Comment)  [manufacturer assistance]       CCM Care Plan  No  Known Allergies  Medications Reviewed Today     Reviewed by De Hollingshead, RPH-CPP (Pharmacist) on 06/26/21 at 1033  Med List Status: <None>   Medication Order Taking? Sig Documenting Provider Last Dose Status Informant  amiodarone (PACERONE) 200 MG tablet [id number] Yes TAKE 1 TABLET BY MOUTH  TWICE DAILY Gollan, Kathlene November, MD Taking Active   B Complex Vitamins (B COMPLEX 50 PO) [id number] Yes Take 1 tablet by mouth daily.  [provider] Taking Active Self  buPROPion (WELLBUTRIN XL) 300 MG 24 hr tablet [id number] Yes Take 1 tablet (300 mg total) by mouth daily. Crecencio Mc, MD Taking Active   Cholecalciferol (VITAMIN D3) 2000 UNITS TABS  [id number] Yes Take 1 tablet by mouth daily.  [provider] Taking Active Self  dabigatran (PRADAXA) 150 MG CAPS capsule [id number] Yes TAKE 1 CAPSULE BY MOUTH TWICE DAILY Gollan, Kathlene November, MD Taking Active   docusate sodium (COLACE) 100 MG capsule [id number] Yes Take 400 mg by mouth daily.  [provider] Taking Active Self           Med Note Rosemarie Beath, MELISSA B   Wed Jun 08, 2017  4:21 PM)    Ferrous Gluconate 324 (37.5 Fe) MG TABS [id number] Yes Take 1 tablet by mouth daily.  [provider] Taking Active Self  furosemide (LASIX) 40 MG tablet [id number] Yes Take 1 tablet (40 mg total) by mouth daily as needed. Minna Merritts, MD Taking Active   gabapentin (NEURONTIN) 300 MG capsule [id number] Yes Take 1 capsule (300 mg total) by mouth 3 (three) times daily. Lyndal Pulley, DO Taking Active   GLUCOSAMINE HCL-MSM PO [id number] Yes Take 1 tablet by mouth daily.  [provider] Taking Active Self  HYDROcodone-acetaminophen (NORCO) 10-325 MG tablet [id number] Yes Take 1 tablet by mouth every 8 (eight) hours as needed. Crecencio Mc, MD Taking Active   levothyroxine (SYNTHROID) 25 MCG tablet [id number] Yes Take 1 tablet (25 mcg total) by mouth daily before breakfast. Minna Merritts, MD Taking Active   losartan-hydrochlorothiazide [facility]) 100-25 MG tablet [id number] Yes TAKE 1 TABLET BY MOUTH EVERY DAY Gollan, Kathlene November, MD Taking Active   metoprolol succinate (TOPROL-XL) 25 MG 24 hr tablet [id number] Yes TAKE 1 TABLET BY MOUTH  DAILY Crecencio Mc, MD Taking Active   Multiple Vitamin (MULTIVITAMIN) tablet [id number] Yes Take 1 tablet by mouth daily. [provider] Taking Active Self  nitroGLYCERIN (NITROSTAT) 0.4 MG SL tablet [id number] No Place 1 tablet (0.4 mg total) under the tongue every 5 (five) minutes as needed for chest pain.  Patient not taking: Reported on 06/26/2021   Minna Merritts, MD Not Taking Active   Potassium Chloride ER 20 MEQ TBCR  [id number] Yes Take 20 mEq by mouth 2 (two) times daily as needed. Minna Merritts, MD Taking Active   propranolol (INDERAL) 20 MG tablet [id number] No Take 1 tablet (20 mg total) by mouth 3 (three) times daily as needed. Reported on 03/22/2016  Patient not taking: Reported on 06/26/2021   Minna Merritts, MD Not Taking Active   venlafaxine XR (EFFEXOR-XR) 75 MG 24 hr capsule [id number] Yes Take 1 capsule (75 mg total) by mouth daily with breakfast. Crecencio Mc, MD Taking Active   vitamin B-12 (CYANOCOBALAMIN) 500 MCG tablet [id number] Yes Take 500 mcg by mouth daily. [provider] Taking Active Self  vitamin E 400 UNIT capsule [id number] Yes Take 400 Units by mouth daily. [provider] Taking  Active Self  Med List Note Leeanne Rio, [place] 03/29/17 1648): **[facility]**            Patient Active Problem List   Diagnosis Date Noted   Lumbar radiculopathy 04/03/2021   Acquired thrombophilia ([place]) 10/02/2020   Encounter for preventive health examination 05/31/2020   Frequent falls 07/01/2019   Urinary frequency 12/28/2017   Major depressive disorder with current active episode 09/26/2017   S/P total knee arthroplasty 06/20/2017   Preoperative evaluation to rule out surgical contraindication 08/02/2016   Chronic venous insufficiency 06/23/2016   Acquired hypothyroidism 04/04/2016   Long-term use of high-risk medication 03/22/2016   Hyperlipidemia 03/22/2016   Pain in joint, ankle and foot 12/04/2015   Persistent atrial fibrillation (HCC)    Systolic dysfunction 97/58/8325   Bilateral leg edema 05/21/2015   OSA (obstructive sleep apnea)    History of DVT (deep vein thrombosis)    B12 deficiency    S/P left unicompartmental knee replacement 02/05/2015   Routine general medical examination at a health care facility 11/08/2014   Morbid obesity ([place]) 08/03/2014   Neck pain 05/13/2014   Rotator cuff insufficiency of left shoulder 05/13/2014   Essential hypertension  10/17/2013   Polyarthralgia 07/04/2013   Neuropathy 07/04/2013   S/P bariatric surgery 07/04/2013   Degenerative joint disease involving multiple joints on both sides of body 07/10/2012    Immunization History  Administered Date(s) Administered   Influenza Split 07/10/2012   Influenza,inj,Quad PF,6+ Mos 07/04/2013   Influenza-Unspecified 08/04/2016, 07/25/2017   PFIZER(Purple Top)SARS-COV-2 Vaccination 07/09/2020, 07/30/2020   PNEUMOCOCCAL CONJUGATE-20 01/05/2021   Tdap 07/10/2012, 03/10/2015    Conditions to be addressed/monitored: Atrial Fibrillation, HTN, and Obesity  Care Plan : Medication Management  Updates made by De Hollingshead, RPH-CPP since 07/08/2021 12:00 AM     Problem: AFib, Hypertension, Obesity      Long-Range Goal: Disease Progression Prevention   Start Date: 06/26/2021  This Visit's Progress: On track  Recent Progress: On track  Priority: High  Note:   Current Barriers:  Unable to independently afford treatment regimen Unable to achieve control of weight   Pharmacist Clinical Goal(s):  Over the next 90 days, patient will verbalize ability to afford treatment regimen Over the next 90 days, patient will achieve improvement in weight  through collaboration with PharmD and provider.   Interventions: 1:1 collaboration with Crecencio Mc, MD regarding development and update of comprehensive plan of care as evidenced by provider attestation and co-signature Inter-disciplinary care team collaboration (see longitudinal plan of care) Comprehensive medication review performed; medication list updated in electronic medical record  Hyperlipidemia and ASCVD risk reduction:  Untreated; current treatment: none;  10 year ASCVD risk: 16% Recommend moderate - high intensity statin for primary prevention. Will discuss moving forward.   Atrial Fibrillation w CHF Appropriately managed;  Rate control: metoprolol succinate 25 mg daily, propranolol 20 mg TID PRN -  has not needed recently Rhythm control: amiodarone 200 mg BI Anticoagulant treatment: Pradaxa 150 mg BID- Reports history of Xarelto and Eliquis - both caused allergic reactions Additional antihypertensives: losartan/HCTZ 100/25 mg daily Diuretic: furosemide 40 mg daily PRN, potassium 20 mEq BID CHADS2VASc score: 3 (age, HTN, CHF) Encouraged to outreach Dr. Donivan Scull office to discuss Tier Exception for Pradaxa.   Depression/Anxiety: Controlled per patient report; current treatment: venlafaxine 75 mg daily, bupropion XL 300 mg daily;  Previously recommended to continue current regimen at this time  Overweight/Obesity Complicated by Afib, CHF: Unable to achieve goal weight loss through  lifestyle modification alone; current treatment: none - starting Ozempic 0.25 mg  Medications/Strategies previously tried: history of bariatric surgery in 2014 Baseline weight: 380 lbs Current meal patterns: breakfast: bagel; lunch: chicken salad sandwich; dinner: meat and vegetables usually; snacks: cheesy popcorn, crackers, likes to graze on leftovers Current exercise: goes to the pool 3 days weekly, works out in the water 45-60 minutes Patient was educated on administration technique today. He signed patient assistance application and was informed we need proof of income. He will bring this back by the clinic at his convenience.   Hypothyroidism: Controlled per last lab work; current regimen: levothyroxine 75 mcg daily Previously recommended to continue current regimen at this time  Chronic Pain: Uncontrolled per last provider visit; current regimen: hydrocodone/acetaminophen 10/325 mg TID, docusate 100 mg daily PRN constipation, gabapentin 300 mg TID  Continue to monitor for sedation, falls risk especially with concurrent anticoagulant therapy. Previously recommended to continue current regimen along with physical activity as able.   Supplements: Multivitamin, glucosamine, Vitamin b12, ferrous gluconate  daily, b complex vitamin, Vitamin D No drug interactions noted at this time.  Patient Goals/Self-Care Activities Over the next 90 days, patient will:  - take medications as prescribed target a minimum of 150 minutes of moderate intensity exercise weekly engage in dietary modifications by moderating carbohydrate portion sizes  Follow Up Plan: Telephone follow up appointment with care management team member scheduled for: 6 weeks     Medication Assistance: Application for Ozempic  medication assistance program. in process.  Anticipated assistance start date TBD.  See plan of care for additional detail.  Patient's preferred pharmacy is:  [facility] [place] [address] [place] [address] Phone: [phone] Fax: [phone]  [id number] [id number]: [phone]: [phone]   Follow Up:  Patient agrees to Care Plan and Follow-up.  Plan: Telephone follow up appointment with care management team member scheduled for:  ~ 6 weeks  Catie TDarnelle Maffucci PharmD, [place] CSycamoreClinical Pharmacist LOccidental [phone]

## 2021-07-08 NOTE — Progress Notes (Signed)
Patient presented for an ozempic teaching and to complete the patient assistance program paperwork. Pt did not currently have ozempic medication and was shown a demonstration using the example pen. Pt was shown proper technique and given instruction on injections and injection sites. Pt voiced no confusion and verbalized understanding of instructions. Pt completed the paperwork and it has been returned to Catie.

## 2021-07-08 NOTE — Patient Instructions (Signed)
Visit Information  PATIENT GOALS:  Goals Addressed               This Visit's Progress     Patient Stated     Medication Monitoring (pt-stated)        Patient Goals/Self-Care Activities Over the next 90 days, patient will:  - take medications as prescribed target a minimum of 150 minutes of moderate intensity exercise weekly engage in dietary modifications by moderating carbohydrate portion sizes         Patient verbalizes understanding of instructions provided today and agrees to view in MyChart.  Plan: Telephone follow up appointment with care management team member scheduled for:  ~ 6 weeks  [name], PharmD, [place], CPP Clinical Pharmacist [employer] at [employer] [phone]

## 2021-07-10 DIAGNOSIS — E7849 Other hyperlipidemia: Secondary | ICD-10-CM | POA: Diagnosis not present

## 2021-07-10 DIAGNOSIS — I10 Essential (primary) hypertension: Secondary | ICD-10-CM | POA: Diagnosis not present

## 2021-07-10 DIAGNOSIS — I4819 Other persistent atrial fibrillation: Secondary | ICD-10-CM

## 2021-07-10 DIAGNOSIS — R69 Illness, unspecified: Secondary | ICD-10-CM | POA: Diagnosis not present

## 2021-07-10 DIAGNOSIS — F331 Major depressive disorder, recurrent, moderate: Secondary | ICD-10-CM

## 2021-07-14 ENCOUNTER — Telehealth: Payer: Self-pay | Admitting: Pharmacy Technician

## 2021-07-14 DIAGNOSIS — Z596 Low income: Secondary | ICD-10-CM

## 2021-07-14 NOTE — Progress Notes (Signed)
[facility])                                            [facility] Quality Pharmacy Team    07/14/2021  Johnathan [name] 1956-09-14 [id number]  Received both patient and provider portion(s) of patient assistance application(s) for Ozempic. Faxed completed application and required documents into [employer].  [name] P. Kyal Arts, CPhT [facility] [employer]  [phone]

## 2021-07-20 ENCOUNTER — Telehealth: Payer: Self-pay | Admitting: Pharmacist

## 2021-07-20 ENCOUNTER — Other Ambulatory Visit: Payer: Self-pay | Admitting: Internal Medicine

## 2021-07-20 NOTE — Telephone Encounter (Signed)
Called patient to remind that we needed proof of income for Ozempic assistance application. Patient notes he will find it today and drop off at our office tomorrow.

## 2021-07-20 NOTE — Telephone Encounter (Signed)
Calling in for refill. Patient last seen 06/17/2021 and medication last sent 90 pills 06/17/2021.   Pended for your approval or denial.

## 2021-07-20 NOTE — Telephone Encounter (Signed)
Refilled: 06/17/2021 Last OV: 06/17/2021 Next OV: 09/16/2021

## 2021-07-21 MED ORDER — HYDROCODONE-ACETAMINOPHEN 10-325 MG PO TABS: 1.0000 | ORAL_TABLET | Freq: Three times a day (TID) | ORAL | 0 refills | Status: DC | PRN

## 2021-07-23 ENCOUNTER — Telehealth: Payer: Self-pay | Admitting: Cardiovascular Disease

## 2021-07-23 NOTE — Telephone Encounter (Addendum)
There is no patient assistance for pradaxa since it will go generic sometime soon. He will need to switch to warfarin in the mean time if he cannot afford. Once Pradaxa is generic and affordable, he could switch back. He has a $150 deductible he has to pay then cost for pradaxa would be $100/month We could attempt a tier exception since he has allergy to the preferred- if successful would bring his cost to $47/month

## 2021-07-23 NOTE — Telephone Encounter (Signed)
Patient spouse calling  States he is no longer able to afford Pradaxa medication due to now having medicare  Would like to discuss other options Patient unable to take Eliquis or Xarelto due to allergic reactions Please call

## 2021-07-23 NOTE — Telephone Encounter (Signed)
Able to reach back out to Mr. Bahl to f/u on Pradaxa, did advised pt on what our pharmacy team had to say  There is no patient assistance for pradaxa since it will go generic sometime soon. He will need to switch to warfarin in the mean time if he cannot afford. Once Pradaxa is generic and affordable, he could switch back. He has a $150 deductible he has to pay then cost for pradaxa would be $100/month We could attempt a tier exception since he has allergy to the preferred- if successful would bring his cost to $47/month  Mr. Kail reports Dr. Melina Schools office has started the tier exception, he is suppose to go tomorrow to drop off his income and other documents at Dr. Melina Schools office to see what they may be able to assist with.  $140 monthly cost $400 3 month supply  Pt reports he only has SSN income and up until now with Medicare he has had no issues with payments. Pt st he will call back next week once he has an update on medication status.

## 2021-08-03 ENCOUNTER — Ambulatory Visit: Payer: Medicare HMO | Admitting: Pharmacist

## 2021-08-03 DIAGNOSIS — E7849 Other hyperlipidemia: Secondary | ICD-10-CM

## 2021-08-03 DIAGNOSIS — I10 Essential (primary) hypertension: Secondary | ICD-10-CM

## 2021-08-03 DIAGNOSIS — R7301 Impaired fasting glucose: Secondary | ICD-10-CM

## 2021-08-03 DIAGNOSIS — I4819 Other persistent atrial fibrillation: Secondary | ICD-10-CM

## 2021-08-03 NOTE — Telephone Encounter (Signed)
Tier exception request sent 10/24

## 2021-08-03 NOTE — Patient Instructions (Signed)
Johnathan [name],   Keep up the great work!  We still need proof of income to submit the assistance application for Ozempic.   Continue Ozempic 0.25 mg weekly for a total of 4 weeks, then increase to 0.5 mg weekly.   Johnathan [name], PharmD   Visit Information  PATIENT GOALS:  Goals Addressed               This Visit's Progress     Patient Stated     Medication Monitoring (pt-stated)        Patient Goals/Self-Care Activities Over the next 90 days, patient will:  - take medications as prescribed target a minimum of 150 minutes of moderate intensity exercise weekly engage in dietary modifications by moderating carbohydrate portion sizes          Patient verbalizes understanding of instructions provided today and agrees to view in MyChart.   Plan: Telephone follow up appointment with care management team member scheduled for:  12 weeks  Johnathan [name], PharmD, [place], CPP Clinical Pharmacist [employer] at [employer] [phone]

## 2021-08-03 NOTE — Chronic Care Management (AMB) (Signed)
Chronic Care Management Pharmacy Note  08/03/2021 Name:  Johnathan [name] MRN:  [id number] DOB:  August 03, 1956  Subjective: Johnathan [name] is an 65 y.o. year old male who is a primary patient of [name], [name], MD.  The CCM team was consulted for assistance with disease management and care coordination needs.    Engaged with patient by telephone for follow up visit for pharmacy case management and/or care coordination services.   Objective:  Medications Reviewed Today     Reviewed by Lourena Simmonds, RPH-CPP (Pharmacist) on 06/26/21 at 1033  Med List Status: <None>   Medication Order Taking? Sig Documenting Provider Last Dose Status Informant  amiodarone (PACERONE) 200 MG tablet [id number] Yes TAKE 1 TABLET BY MOUTH  TWICE DAILY Gollan, Tollie Pizza, MD Taking Active   B Complex Vitamins (B COMPLEX 50 PO) [id number] Yes Take 1 tablet by mouth daily.  [provider] Taking Active Self  buPROPion (WELLBUTRIN XL) 300 MG 24 hr tablet [id number] Yes Take 1 tablet (300 mg total) by mouth daily. Sherlene Shams, MD Taking Active   Cholecalciferol (VITAMIN D3) 2000 UNITS TABS [id number] Yes Take 1 tablet by mouth daily.  [provider] Taking Active Self  dabigatran (PRADAXA) 150 MG CAPS capsule [id number] Yes TAKE 1 CAPSULE BY MOUTH TWICE DAILY Gollan, Tollie Pizza, MD Taking Active   docusate sodium (COLACE) 100 MG capsule [id number] Yes Take 400 mg by mouth daily.  [provider] Taking Active Self           Med Note Kai Levins, MELISSA B   Wed Jun 08, 2017  4:21 PM)    Ferrous Gluconate 324 (37.5 Fe) MG TABS [id number] Yes Take 1 tablet by mouth daily.  [provider] Taking Active Self  furosemide (LASIX) 40 MG tablet [id number] Yes Take 1 tablet (40 mg total) by mouth daily as needed. Antonieta Iba, MD Taking Active   gabapentin (NEURONTIN) 300 MG capsule [id number] Yes Take 1 capsule (300 mg total) by mouth 3 (three) times daily. Judi Saa, DO Taking  Active   GLUCOSAMINE HCL-MSM PO [id number] Yes Take 1 tablet by mouth daily.  [provider] Taking Active Self  HYDROcodone-acetaminophen (NORCO) 10-325 MG tablet [id number] Yes Take 1 tablet by mouth every 8 (eight) hours as needed. Sherlene Shams, MD Taking Active   levothyroxine (SYNTHROID) 25 MCG tablet [id number] Yes Take 1 tablet (25 mcg total) by mouth daily before breakfast. Antonieta Iba, MD Taking Active   losartan-hydrochlorothiazide [facility]) 100-25 MG tablet [id number] Yes TAKE 1 TABLET BY MOUTH EVERY DAY Gollan, Tollie Pizza, MD Taking Active   metoprolol succinate (TOPROL-XL) 25 MG 24 hr tablet [id number] Yes TAKE 1 TABLET BY MOUTH  DAILY Sherlene Shams, MD Taking Active   Multiple Vitamin (MULTIVITAMIN) tablet [id number] Yes Take 1 tablet by mouth daily. [provider] Taking Active Self  nitroGLYCERIN (NITROSTAT) 0.4 MG SL tablet [id number] No Place 1 tablet (0.4 mg total) under the tongue every 5 (five) minutes as needed for chest pain.  Patient not taking: Reported on 06/26/2021   Antonieta Iba, MD Not Taking Active   Potassium Chloride ER 20 MEQ TBCR [id number] Yes Take 20 mEq by mouth 2 (two) times daily as needed. Antonieta Iba, MD Taking Active   propranolol (INDERAL) 20 MG tablet [id number] No Take 1 tablet (20 mg total) by mouth 3 (three) times daily as needed. Reported on 03/22/2016  Patient not taking: Reported on 06/26/2021  Antonieta Iba, MD Not Taking Active   venlafaxine XR (EFFEXOR-XR) 75 MG 24 hr capsule [id number] Yes Take 1 capsule (75 mg total) by mouth daily with breakfast. Sherlene Shams, MD Taking Active   vitamin B-12 (CYANOCOBALAMIN) 500 MCG tablet [id number] Yes Take 500 mcg by mouth daily. [provider] Taking Active Self  vitamin E 400 UNIT capsule [id number] Yes Take 400 Units by mouth daily. [provider] Taking Active Self  Med List Note Warden Fillers, [facility] 03/29/17 1648): **[facility]**           Wt  Readings from Last 3 Encounters:  06/17/21 (!) 380 lb 9.6 oz (172.6 kg)  06/17/21 (!) 381 lb (172.8 kg)  04/23/21 (!) 373 lb (169.2 kg)    Assessment/Interventions: Review of patient past medical history, allergies, medications, health status, including review of consultants reports, laboratory and other test data, was performed as part of comprehensive evaluation and provision of chronic care management services.   SDOH:  (Social Determinants of Health) assessments and interventions performed: Yes SDOH Interventions    Flowsheet Row Most Recent Value  SDOH Interventions   Financial Strain Interventions Other (Comment)  [manufacturer assistance]        CCM Care Plan  Review of patient past medical history, allergies, medications, health status, including review of consultants reports, laboratory and other test data, was performed as part of comprehensive evaluation and provision of chronic care management services.   Conditions to be addressed/monitored:  Obesity  Care Plan : Medication Management  Updates made by Lourena Simmonds, RPH-CPP since 08/03/2021 12:00 AM     Problem: AFib, Hypertension, Obesity      Long-Range Goal: Disease Progression Prevention   Start Date: 06/26/2021  Recent Progress: On track  Priority: High  Note:   Current Barriers:  Unable to independently afford treatment regimen Unable to achieve control of weight   Pharmacist Clinical Goal(s):  Over the next 90 days, patient will verbalize ability to afford treatment regimen Over the next 90 days, patient will achieve improvement in weight  through collaboration with PharmD and provider.   Interventions: 1:1 collaboration with Sherlene Shams, MD regarding development and update of comprehensive plan of care as evidenced by provider attestation and co-signature Inter-disciplinary care team collaboration (see longitudinal plan of care) Comprehensive medication review performed; medication list  updated in electronic medical record  Overweight/Obesity Complicated by Afib, CHF: Unable to achieve goal weight loss through lifestyle modification alone; current treatment: Ozempic 0.25 mg x 2 weeks  Denies GI upset Medications/Strategies previously tried: history of bariatric surgery in 2014 Baseline weight: 380 lbs; most recent weight: 375 lbs (5 lbs weight loss, 1.3% weight loss thus far) Current meal patterns: does report that he has cut down on portion sizes.  Current exercise: goes to the pool 3 days weekly, works out in the water 45-60 minutes Reviewed need to provide proof of income to pursue Ozempic assistance from the manufacturer. Patient reports he will plan to bring by tomorrow.  Encouraged to continue Ozempic 0.25 mg weekly for a total of 4 weeks, then increase to 0.5 mg weekly as previously discussed.   Atrial Fibrillation w CHF Appropriately managed;  Rate control: metoprolol succinate 25 mg daily, propranolol 20 mg TID PRN - has not needed recently Rhythm control: amiodarone 200 mg BI Anticoagulant treatment: Pradaxa 150 mg BID- Reports history of Xarelto and Eliquis - both caused allergic reactions Additional antihypertensives: losartan/HCTZ 100/25 mg daily Diuretic: furosemide 40 mg daily PRN,  potassium 20 mEq BID CHADS2VASc score: 3 (age, HTN, CHF) Per chart review, patient told cardiology that we were completing Tier Exception for Pradaxa. Will collaborate w/ cardiology nursing and pharmacy staff to clarify.   Depression/Anxiety: Controlled per patient report; current treatment: venlafaxine 75 mg daily, bupropion XL 300 mg daily;  Previously recommended to continue current regimen at this time  Hyperlipidemia and ASCVD risk reduction:  Untreated; current treatment: none;  10 year ASCVD risk: 16% Recommend moderate - high intensity statin for primary prevention. Will discuss moving forward.   Hypothyroidism: Controlled per last lab work; current regimen:  levothyroxine 75 mcg daily Previously recommended to continue current regimen at this time  Chronic Pain: Uncontrolled per last provider visit; current regimen: hydrocodone/acetaminophen 10/325 mg TID, docusate 100 mg daily PRN constipation, gabapentin 300 mg TID  Continue to monitor for sedation, falls risk especially with concurrent anticoagulant therapy. Previously recommended to continue current regimen along with physical activity as able.   Supplements: Multivitamin, glucosamine, Vitamin b12, ferrous gluconate daily, b complex vitamin, Vitamin D No drug interactions noted at this time.  Patient Goals/Self-Care Activities Over the next 90 days, patient will:  - take medications as prescribed target a minimum of 150 minutes of moderate intensity exercise weekly engage in dietary modifications by moderating carbohydrate portion sizes  Follow Up Plan: Telephone follow up appointment with care management team member scheduled for: 12 weeks - PCP visit in ~ 6 weeks      Plan: Telephone follow up appointment with care management team member scheduled for:  12 weeks  [name], PharmD, [place], CPP Clinical Pharmacist [employer] at [employer] [phone]

## 2021-08-20 ENCOUNTER — Telehealth: Payer: Self-pay | Admitting: Pharmacy Technician

## 2021-08-20 NOTE — Progress Notes (Signed)
[facility] Customer service manager [facility])                                            [facility] Quality Pharmacy Team    08/20/2021  Johnathan [name] Jul 14, 1956 [id number]  Received patient's proof of income and submitted to [employer] on 08/18/2021.  Today a care coordination call was placed to [employer] to check on patient's determination status for Ozempic.  Spoke to Lerna who informed patient was APPROVED 08/20/2021-10/10/2021. She informed patient would receive 1 shipment and would have to reapply for 2023. She informed it shows he would be receiving 5 boxes which is surprising to her since he is approved so late in the year. She informed the order would go to processing on 08/21/21 and patient should receive in 15 business days from 08/21/21 barring any shipping delays or supply challenges.  In basket message sent to embedded PharmD notifying of patient's need to reapply in 2023 as well as his approval for 2022.  Ailin Rochford P. Marirose Deveney, CPhT [facility] [employer]  [phone]

## 2021-08-21 ENCOUNTER — Other Ambulatory Visit: Payer: Self-pay

## 2021-08-21 MED ORDER — HYDROCODONE-ACETAMINOPHEN 10-325 MG PO TABS: 1.0000 | ORAL_TABLET | Freq: Three times a day (TID) | ORAL | 0 refills | Status: DC | PRN

## 2021-08-21 NOTE — Telephone Encounter (Signed)
Refilled: 07/21/2021 Last OV: 06/17/2021 Next OV: 09/16/2021

## 2021-09-16 ENCOUNTER — Other Ambulatory Visit: Payer: Self-pay

## 2021-09-16 ENCOUNTER — Ambulatory Visit (INDEPENDENT_AMBULATORY_CARE_PROVIDER_SITE_OTHER): Payer: Medicare HMO | Admitting: Internal Medicine

## 2021-09-16 ENCOUNTER — Encounter: Payer: Self-pay | Admitting: Internal Medicine

## 2021-09-16 VITALS — BP 140/82 | HR 62 | Temp 95.9°F | Ht 75.0 in | Wt 394.8 lb

## 2021-09-16 DIAGNOSIS — E039 Hypothyroidism, unspecified: Secondary | ICD-10-CM

## 2021-09-16 DIAGNOSIS — I422 Other hypertrophic cardiomyopathy: Secondary | ICD-10-CM | POA: Diagnosis not present

## 2021-09-16 DIAGNOSIS — R69 Illness, unspecified: Secondary | ICD-10-CM | POA: Diagnosis not present

## 2021-09-16 DIAGNOSIS — Z125 Encounter for screening for malignant neoplasm of prostate: Secondary | ICD-10-CM | POA: Diagnosis not present

## 2021-09-16 DIAGNOSIS — R4189 Other symptoms and signs involving cognitive functions and awareness: Secondary | ICD-10-CM | POA: Diagnosis not present

## 2021-09-16 DIAGNOSIS — E7849 Other hyperlipidemia: Secondary | ICD-10-CM | POA: Diagnosis not present

## 2021-09-16 DIAGNOSIS — M159 Polyosteoarthritis, unspecified: Secondary | ICD-10-CM | POA: Diagnosis not present

## 2021-09-16 DIAGNOSIS — F331 Major depressive disorder, recurrent, moderate: Secondary | ICD-10-CM

## 2021-09-16 MED ORDER — HYDROCODONE-ACETAMINOPHEN 10-325 MG PO TABS
1.0000 | ORAL_TABLET | Freq: Three times a day (TID) | ORAL | 0 refills | Status: DC | PRN
Start: 2021-09-16 — End: 2021-12-15

## 2021-09-16 MED ORDER — HYDROCODONE-ACETAMINOPHEN 10-325 MG PO TABS: 1.0000 | ORAL_TABLET | Freq: Three times a day (TID) | ORAL | 0 refills | Status: DC | PRN

## 2021-09-16 MED ORDER — VENLAFAXINE HCL ER 150 MG PO CP24: 150.0000 mg | ORAL_CAPSULE | Freq: Every day | ORAL | 0 refills | Status: DC

## 2021-09-16 MED ORDER — ROSUVASTATIN CALCIUM 10 MG PO TABS: 10.0000 mg | ORAL_TABLET | Freq: Every day | ORAL | 2 refills | Status: DC

## 2021-09-16 NOTE — Assessment & Plan Note (Signed)
With mild systolic dysfunction ,  Mild MR, with LA enlargement ,  Reviewed myoview  From 2015 noting a fixed defect  And addressed need for statin therapy despite normal appearing lipid panel.  He is willing to initiate statin therapy

## 2021-09-16 NOTE — Assessment & Plan Note (Signed)
Thyroid function is WNL on current minimal dose.  No current changes needed.    Lab Results  Component Value Date   TSH 4.25 03/11/2021

## 2021-09-16 NOTE — Assessment & Plan Note (Signed)
Increase effexor to 150 mg daily for the winter months

## 2021-09-16 NOTE — Patient Instructions (Signed)
   Increase the Effexor dose (venlafaxine)to 150 mg daily for the next 3 months  Trial of generic Crestor 10 mg daily to lower your risk of stroke and heart attacks  Resume Ozempic at 0.25 mg dose    Fasting lbs in 3 weeks

## 2021-09-16 NOTE — Assessment & Plan Note (Signed)
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   I have refilled his hydrocodone for  Use not more than 3 times daily for three months  and advised him to continue to maximize his tylenol use to 2000 mg daily. .  NSAIDs are contraindicated due to history of gastric bypass and use of anticoagulation for embolic stroke risk reduction

## 2021-09-16 NOTE — Progress Notes (Signed)
Subjective:  Patient ID: Johnathan [name], male    DOB: 01-07-1956  Age: 65 y.o. MRN: [id number]  CC: The primary encounter diagnosis was Other hyperlipidemia. 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  HPI Johnathan [name] presents for  Chief Complaint  Patient presents with   Follow-up    Medication refill   This visit occurred during the SARS-CoV-2 public health emergency.  Safety protocols were in place, including screening questions prior to the visit, additional usage of staff PPE, and extensive cleaning of exam room while observing appropriate contact time as indicated for disinfecting solutions.   Major depressive disorder:  taking wellbutrin and effexor .  Positive screen ,  mood typically worsens every winter due    Wellbutrin 300 effexor 75 mg daily . Has zero motivation . Bored. Overating . Marland Kitchen  Discussed increasing his sunlight exposure daily  and increasing his dose of effexor to 150 mg daily  for the winter months   Atrial fib:  rate controlled. Taking Pradaxa   Morbid obesity :  was losing weight with ozempic prescribed in September ; initially lost 5 lbs  now has gained 15 because he  was relying on medication assistance and has not received the 5 boxes he has been approved for from [employer].  Samples given   Chroic back pain and knee pain.  Aggravated by morbid obesity and inactivity.  Refill history confirmed via [place] Controlled Substance databas, accessed by me today..      Outpatient Medications Prior to Visit  Medication Sig Dispense Refill   amiodarone (PACERONE) 200 MG tablet TAKE 1 TABLET BY MOUTH  TWICE DAILY 180 tablet 3   B Complex Vitamins (B COMPLEX 50 PO)  Take 1 tablet by mouth daily.      buPROPion (WELLBUTRIN XL) 300 MG 24 hr tablet Take 1 tablet (300 mg total) by mouth daily. 90 tablet 1   Cholecalciferol (VITAMIN D3) 2000 UNITS TABS Take 1 tablet by mouth daily.      dabigatran (PRADAXA) 150 MG CAPS capsule TAKE 1 CAPSULE BY MOUTH TWICE DAILY 180 capsule 3   docusate sodium (COLACE) 100 MG capsule Take 400 mg by mouth daily.      Ferrous Gluconate 324 (37.5 Fe) MG TABS Take 1 tablet by mouth daily.      furosemide (LASIX) 40 MG tablet Take 1 tablet (40 mg total) by mouth daily as needed. 90 tablet 3   gabapentin (NEURONTIN) 300 MG capsule Take 1 capsule (300 mg total) by mouth 3 (three) times daily. 270 capsule 3   GLUCOSAMINE HCL-MSM PO Take 1 tablet by mouth daily.      levothyroxine (SYNTHROID) 25 MCG tablet Take 1 tablet (25 mcg total) by mouth daily before breakfast. 90 tablet 3   losartan-hydrochlorothiazide (HYZAAR) 100-25 MG tablet TAKE 1 TABLET BY MOUTH EVERY DAY 90 tablet 0   metoprolol succinate (TOPROL-XL) 25 MG 24 hr tablet TAKE 1 TABLET BY MOUTH  DAILY 90 tablet 3   Multiple Vitamin (MULTIVITAMIN) tablet Take 1 tablet by mouth daily.     nitroGLYCERIN (NITROSTAT) 0.4 MG SL tablet Place 1 tablet (0.4 mg total) under the tongue every 5 (five) minutes as needed for chest pain. 30 tablet 3   Potassium Chloride ER 20 MEQ TBCR Take  20 mEq by mouth 2 (two) times daily as needed. 180 tablet 3   propranolol (INDERAL) 20 MG tablet Take 1 tablet (20 mg total) by mouth 3 (three) times daily as needed. Reported on 03/22/2016 270 tablet 3   Semaglutide,0.25 or 0.5MG /DOS, (OZEMPIC, 0.25 OR 0.5 MG/DOSE,) 2 MG/1.5ML SOPN Inject 0.25 mg weekly for 4 weeks, then increase to 0.5 mg weekly 1.5 mL 0   vitamin B-12 (CYANOCOBALAMIN) 500 MCG tablet Take 500 mcg by mouth daily.     vitamin E 400 UNIT capsule Take 400 Units by mouth daily.     HYDROcodone-acetaminophen (NORCO) 10-325 MG tablet Take 1 tablet by mouth every 8 (eight) hours as needed. 90 tablet  0   venlafaxine XR (EFFEXOR-XR) 75 MG 24 hr capsule Take 1 capsule (75 mg total) by mouth daily with breakfast. 90 capsule 3   hydrochlorothiazide (HYDRODIURIL) 25 MG tablet Take 25 mg by mouth daily.     No facility-administered medications prior to visit.    Review of Systems;  Patient denies headache, fevers, malaise, unintentional weight loss, skin rash, eye pain, sinus congestion and sinus pain, sore throat, dysphagia,  hemoptysis , cough, dyspnea, wheezing, chest pain, palpitations, orthopnea, edema, abdominal pain, nausea, melena, diarrhea, constipation, flank pain, dysuria, hematuria, urinary  Frequency, nocturia, numbness, tingling, seizures,  Focal weakness, Loss of consciousness,  Tremor, insomnia, depression, anxiety, and suicidal ideation.      Objective:  BP 140/82 (BP Location: Left Arm, Patient Position: Sitting, Cuff Size: Large)   Pulse 62   Temp (!) 95.9 F (35.5 C) (Temporal)   Ht 6\' 3"  (1.905 m)   Wt (!) 394 lb 12.8 oz (179.1 kg)   SpO2 96%   BMI 49.35 kg/m   BP Readings from Last 3 Encounters:  09/16/21 140/82  06/17/21 140/84  06/17/21 138/88    Wt Readings from Last 3 Encounters:  09/16/21 (!) 394 lb 12.8 oz (179.1 kg)  06/17/21 (!) 380 lb 9.6 oz (172.6 kg)  06/17/21 (!) 381 lb (172.8 kg)    General appearance: alert, cooperative and appears stated age Ears: normal TM's and external ear canals both ears Throat: lips, mucosa, and tongue normal; teeth and gums normal Neck: no adenopathy, no carotid bruit, supple, symmetrical, trachea midline and thyroid not enlarged, symmetric, no tenderness/mass/nodules Back: symmetric, no curvature. ROM normal. No CVA tenderness. Lungs: clear to auscultation bilaterally Heart: regular rate and rhythm, S1, S2 normal, no murmur, click, rub or gallop Abdomen: soft, non-tender; bowel sounds normal; no masses,  no organomegaly Pulses: 2+ and symmetric Skin: Skin color, texture, turgor normal. No rashes or lesions Lymph  nodes: Cervical, supraclavicular, and axillary nodes normal.  Lab Results  Component Value Date   HGBA1C 5.9 03/11/2021   HGBA1C 5.6 05/29/2020   HGBA1C 5.6 03/29/2018    Lab Results  Component Value Date   CREATININE 1.13 03/11/2021   CREATININE 1.07 09/29/2020   CREATININE 1.09 03/07/2020    Lab Results  Component Value Date   WBC 7.3 09/29/2020   HGB 15.2 09/29/2020   HCT 44.9 09/29/2020   PLT 160 09/29/2020   GLUCOSE 96 03/11/2021   CHOL 163 09/29/2020   TRIG 94 09/29/2020   HDL 45 09/29/2020   LDLCALC 101 (H) 09/29/2020   ALT 23 03/11/2021   AST 23 03/11/2021   NA 146 (H) 03/11/2021   K 4.1 03/11/2021   CL 108 03/11/2021   CREATININE 1.13 03/11/2021   BUN 23 03/11/2021   CO2 21 03/11/2021  TSH 4.25 03/11/2021   INR 1.21 06/09/2017   HGBA1C 5.9 03/11/2021    DG Knee Right Port  Result Date: 06/20/2017 CLINICAL DATA:  Status post total knee replaced EXAM: PORTABLE RIGHT KNEE - 1-2 VIEW COMPARISON:  None. FINDINGS: Right total knee arthroplasty in satisfactory position. No fracture or dislocation is seen. Associated surgical drain. Overlying skin staples. IMPRESSION: Right total knee arthroplasty in satisfactory position. Electronically Signed   By: Julian Hy M.D.   On: 06/20/2017 11:33    Assessment & Plan:   Problem List Items Addressed This Visit     Morbid obesity ([place])    Resuming ozempic using samples while awaiting the supply from pharma       Major depressive disorder with current active episode    Increase effexor to 150 mg daily for the winter months       Relevant Medications   venlafaxine XR (EFFEXOR-XR) 150 MG 24 hr capsule   Hyperlipidemia - Primary    Recommending statin therapy for primary prevention given abnormal myoview in 2015  Lab Results  Component Value Date   CHOL 163 09/29/2020   HDL 45 09/29/2020   LDLCALC 101 (H) 09/29/2020   TRIG 94 09/29/2020   CHOLHDL 3.6 09/29/2020        Relevant Medications    rosuvastatin (CRESTOR) 10 MG tablet   Other Relevant Orders   Comprehensive metabolic panel   Lipid panel   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   I have refilled his hydrocodone for  Use not more than 3 times daily for three months  and advised him to continue to maximize his tylenol use to 2000 mg daily. .  NSAIDs are contraindicated due to history of gastric bypass and use of anticoagulation for embolic stroke risk reduction       Relevant Medications   HYDROcodone-acetaminophen (NORCO) 10-325 MG tablet   HYDROcodone-acetaminophen (NORCO) 10-325 MG tablet   HYDROcodone-acetaminophen (NORCO) 10-325 MG tablet   Cardiomyopathy ([place])    With mild systolic dysfunction ,  Mild MR, with LA enlargement ,  Reviewed myoview  From 2015 noting a fixed defect  And addressed need for statin therapy despite normal appearing lipid panel.  He is willing to initiate statin therapy      Relevant Medications   rosuvastatin (CRESTOR) 10 MG tablet   Acquired hypothyroidism    Thyroid function is WNL on current minimal dose.  No current changes needed.    Lab Results  Component Value Date   TSH 4.25 03/11/2021         Relevant Orders   TSH   Other Visit Diagnoses     Prostate cancer screening       Relevant Orders   PSA   Cognitive change       Relevant Orders   B12 and Folate Panel   RPR       I have discontinued Gumaro A. Koehl's hydrochlorothiazide. I have also changed his venlafaxine XR. Additionally, I am having him start on rosuvastatin. Lastly, I am having him maintain his Ferrous Gluconate, multivitamin, B Complex Vitamins (B COMPLEX 50  PO),  vitamin B-12, Vitamin D3, GLUCOSAMINE HCL-MSM PO, vitamin E, docusate sodium, propranolol, furosemide, levothyroxine, nitroGLYCERIN, Potassium Chloride ER, amiodarone, metoprolol succinate, dabigatran, losartan-hydrochlorothiazide, gabapentin, buPROPion, Ozempic (0.25 or 0.5 MG/DOSE), HYDROcodone-acetaminophen, HYDROcodone-acetaminophen, and HYDROcodone-acetaminophen.  Meds ordered this encounter  Medications   rosuvastatin (CRESTOR) 10 MG tablet    Sig: Take 1 tablet (10 mg total) by mouth daily at 2 PM.    Dispense:  30 tablet    Refill:  2   venlafaxine XR (EFFEXOR-XR) 150 MG 24 hr capsule    Sig: Take 1 capsule (150 mg total) by mouth daily with breakfast.    Dispense:  90 capsule    Refill:  0   HYDROcodone-acetaminophen (NORCO) 10-325 MG tablet    Sig: Take 1 tablet by mouth every 8 (eight) hours as needed.    Dispense:  90 tablet    Refill:  0    Do not refill less than 30 days from prior refill   HYDROcodone-acetaminophen (NORCO) 10-325 MG tablet    Sig: Take 1 tablet by mouth every 8 (eight) hours as needed.    Dispense:  90 tablet    Refill:  0    Do not refill less than 30 days from prior refill   HYDROcodone-acetaminophen (NORCO) 10-325 MG tablet    Sig: Take 1 tablet by mouth every 8 (eight) hours as needed.    Dispense:  90 tablet    Refill:  0    Do not refill less than 30 days from prior refill     I provided  40 minutes of  face-to-face time during this encounter reviewing patient's current problems and past surgeries, labs and imaging studies, providing counseling on the above mentioned problems , and coordination  of care .   Follow-up: Return in about 3 months (around 12/15/2021).   Sherlene Shams, MD

## 2021-09-17 NOTE — Assessment & Plan Note (Signed)
Resuming ozempic using samples while awaiting the supply from pharma

## 2021-09-17 NOTE — Assessment & Plan Note (Signed)
Recommending statin therapy for primary prevention given abnormal myoview in 2015  Lab Results  Component Value Date   CHOL 163 09/29/2020   HDL 45 09/29/2020   LDLCALC 101 (H) 09/29/2020   TRIG 94 09/29/2020   CHOLHDL 3.6 09/29/2020

## 2021-10-16 ENCOUNTER — Other Ambulatory Visit: Payer: Self-pay

## 2021-10-16 ENCOUNTER — Other Ambulatory Visit (INDEPENDENT_AMBULATORY_CARE_PROVIDER_SITE_OTHER): Payer: PPO

## 2021-10-16 ENCOUNTER — Telehealth: Payer: Self-pay | Admitting: Pharmacist

## 2021-10-16 DIAGNOSIS — E039 Hypothyroidism, unspecified: Secondary | ICD-10-CM

## 2021-10-16 DIAGNOSIS — R4189 Other symptoms and signs involving cognitive functions and awareness: Secondary | ICD-10-CM

## 2021-10-16 DIAGNOSIS — E7849 Other hyperlipidemia: Secondary | ICD-10-CM | POA: Diagnosis not present

## 2021-10-16 DIAGNOSIS — Z125 Encounter for screening for malignant neoplasm of prostate: Secondary | ICD-10-CM | POA: Diagnosis not present

## 2021-10-16 LAB — COMPREHENSIVE METABOLIC PANEL
ALT: 27 U/L (ref 0–53)
AST: 24 U/L (ref 0–37)
Albumin: 4.2 g/dL (ref 3.5–5.2)
Alkaline Phosphatase: 88 U/L (ref 39–117)
BUN: 15 mg/dL (ref 6–23)
CO2: 27 mEq/L (ref 19–32)
Calcium: 8.8 mg/dL (ref 8.4–10.5)
Chloride: 104 mEq/L (ref 96–112)
Creatinine, Ser: 1.03 mg/dL (ref 0.40–1.50)
GFR: 75.85 mL/min (ref >60.00)
Glucose, Bld: 103 mg/dL — ABNORMAL HIGH (ref 70–99)
Potassium: 4.3 mEq/L (ref 3.5–5.1)
Sodium: 141 mEq/L (ref 135–145)
Total Bilirubin: 0.5 mg/dL (ref 0.2–1.2)
Total Protein: 6.9 g/dL (ref 6.0–8.3)

## 2021-10-16 LAB — B12 AND FOLATE PANEL
Folate: >24.2 ng/mL (ref >5.9)
Vitamin B-12: >1550 pg/mL — ABNORMAL HIGH (ref 211–911)

## 2021-10-16 LAB — TSH: TSH: 3.27 u[IU]/mL (ref 0.35–5.50)

## 2021-10-16 LAB — PSA: PSA: 0.68 ng/mL (ref 0.10–4.00)

## 2021-10-16 LAB — LIPID PANEL
Cholesterol: 120 mg/dL (ref 0–200)
HDL: 45.5 mg/dL (ref >39.00)
LDL Cholesterol: 58 mg/dL (ref 0–99)
NonHDL: 74.46
Total CHOL/HDL Ratio: 3
Triglycerides: 82 mg/dL (ref 0.0–149.0)
VLDL: 16.4 mg/dL (ref 0.0–40.0)

## 2021-10-16 NOTE — Telephone Encounter (Signed)
Received patient's order of Ozempic from patient assistance. Contacted patient to notify. He will come back by to pick up at his convenience.   Received Ozempic 2 mg/1.5 mL - 2 boxes.

## 2021-10-20 LAB — RPR TITER: RPR Titer: 1:1 {titer} — ABNORMAL HIGH

## 2021-10-20 LAB — RPR: RPR Ser Ql: REACTIVE — AB

## 2021-10-20 LAB — FLUORESCENT TREPONEMAL AB(FTA)-IGG-BLD: Fluorescent Treponemal ABS: NONREACTIVE

## 2021-10-20 NOTE — Progress Notes (Deleted)
° °  I, Christoper Fabian, LAT, ATC, am serving as scribe for Dr. Clementeen Graham.  Johnathan [name] is a 66 y.o. male who presents to [facility] at [facility] today for f/u of chronic LBP due to lumbar radiculopathy and spinal stenosis.  He was last seen by Dr. Katrinka Blazing on 06/17/21 for f/u of both his lumbar radic and LE neuropathy.  He was advised to con't w/ PT and aquatic PT to help w/ balance and coordination and to help decrease falls.  He takes Gabapentin and hydrocodone-acetaminophen.  He uses either a walker or a cane when walking.  Today, pt reports   Diagnostic testing: Pelvis, c-spine and L-spine XR- 04/03/21  Pertinent review of systems: ***  Relevant historical information: ***   Exam:  There were no vitals taken for this visit. General: Well Developed, well nourished, and in no acute distress.   MSK: ***    Lab and Radiology Results No results found for this or any previous visit (from the past 72 hour(s)). No results found.     Assessment and Plan: 66 y.o. male with ***   PDMP not reviewed this encounter. No orders of the defined types were placed in this encounter.  No orders of the defined types were placed in this encounter.    Discussed warning signs or symptoms. Please see discharge instructions. Patient expresses understanding.   ***

## 2021-10-21 ENCOUNTER — Ambulatory Visit: Payer: PPO | Admitting: Family Medicine

## 2021-10-26 ENCOUNTER — Ambulatory Visit (INDEPENDENT_AMBULATORY_CARE_PROVIDER_SITE_OTHER): Payer: PPO | Admitting: Pharmacist

## 2021-10-26 DIAGNOSIS — I422 Other hypertrophic cardiomyopathy: Secondary | ICD-10-CM

## 2021-10-26 DIAGNOSIS — I4819 Other persistent atrial fibrillation: Secondary | ICD-10-CM

## 2021-10-26 DIAGNOSIS — E7849 Other hyperlipidemia: Secondary | ICD-10-CM

## 2021-10-26 NOTE — Telephone Encounter (Signed)
Pt has picked up medication.  

## 2021-10-26 NOTE — Chronic Care Management (AMB) (Addendum)
Chronic Care Management CCM Pharmacy Note  10/26/2021 Name:  Johnathan [name] MRN:  [id number] DOB:  08-May-1956  Summary: - Tolerating regimen well. Plans to come pick up last shipment of Ozempic from 2022 approval - Unable to reapply for Ozempic assistance for off label use for 2023.   Recommendations/Changes made from today's visit: - Recommended to continue current regimen at this time  Subjective: Johnathan [name] is an 66 y.o. year old male who is a primary patient of Darrick Huntsman, [name], MD.  The CCM team was consulted for assistance with disease management and care coordination needs.    Engaged with patient by telephone for follow up visit for pharmacy case management and/or care coordination services.   Objective:  Medications Reviewed Today     Reviewed by Lourena Simmonds, RPH-CPP (Pharmacist) on 10/26/21 at 1052  Med List Status: <None>   Medication Order Taking? Sig Documenting Provider Last Dose Status Informant  amiodarone (PACERONE) 200 MG tablet [id number] Yes TAKE 1 TABLET BY MOUTH  TWICE DAILY Gollan, Tollie Pizza, MD Taking Active   B Complex Vitamins (B COMPLEX 50 PO) [id number] Yes Take 1 tablet by mouth daily.  [provider] Taking Active Self  buPROPion (WELLBUTRIN XL) 300 MG 24 hr tablet [id number] Yes Take 1 tablet (300 mg total) by mouth daily. Sherlene Shams, MD Taking Active   Cholecalciferol (VITAMIN D3) 2000 UNITS TABS [id number] Yes Take 1 tablet by mouth daily.  [provider] Taking Active Self  dabigatran (PRADAXA) 150 MG CAPS capsule [id number] Yes TAKE 1 CAPSULE BY MOUTH TWICE DAILY Gollan, Tollie Pizza, MD Taking Active   docusate sodium (COLACE) 100 MG capsule [id number] Yes Take 400 mg by mouth daily.  [provider] Taking Active Self           Med Note Kai Levins, MELISSA B   Wed Jun 08, 2017  4:21 PM)    Ferrous Gluconate 324 (37.5 Fe) MG TABS [id number] Yes Take 1 tablet by mouth daily.  [provider] Taking Active  Self  furosemide (LASIX) 40 MG tablet [id number] Yes Take 1 tablet (40 mg total) by mouth daily as needed. Antonieta Iba, MD Taking Active   gabapentin (NEURONTIN) 300 MG capsule [id number] Yes Take 1 capsule (300 mg total) by mouth 3 (three) times daily. Judi Saa, DO Taking Active   GLUCOSAMINE HCL-MSM PO [id number] Yes Take 1 tablet by mouth daily.  [provider] Taking Active Self  HYDROcodone-acetaminophen (NORCO) 10-325 MG tablet [id number] Yes Take 1 tablet by mouth every 8 (eight) hours as needed. Sherlene Shams, MD Taking Active   HYDROcodone-acetaminophen [facility]) 10-325 MG tablet [id number]  Take 1 tablet by mouth every 8 (eight) hours as needed. Sherlene Shams, MD  Active   HYDROcodone-acetaminophen [facility]) 10-325 MG tablet [id number]  Take 1 tablet by mouth every 8 (eight) hours as needed. Sherlene Shams, MD  Active   levothyroxine (SYNTHROID) 25 MCG tablet [id number] Yes Take 1 tablet (25 mcg total) by mouth daily before breakfast. Antonieta Iba, MD Taking Active   losartan-hydrochlorothiazide [facility]) 100-25 MG tablet [id number] Yes TAKE 1 TABLET BY MOUTH EVERY DAY Gollan, Tollie Pizza, MD Taking Active   metoprolol succinate (TOPROL-XL) 25 MG 24 hr tablet [id number] Yes TAKE 1 TABLET BY MOUTH  DAILY Sherlene Shams, MD Taking Active   Multiple Vitamin (MULTIVITAMIN) tablet [id number] Yes Take 1 tablet by mouth daily. [provider] Taking Active Self  nitroGLYCERIN (NITROSTAT) 0.4 MG  SL tablet [id number]  Place 1 tablet (0.4 mg total) under the tongue every 5 (five) minutes as needed for chest pain. Antonieta IbaGollan, Timothy J, MD  Active   Potassium Chloride ER 20 MEQ TBCR [id number] Yes Take 20 mEq by mouth 2 (two) times daily as needed. Antonieta IbaGollan, Timothy J, MD Taking Active   propranolol (INDERAL) 20 MG tablet [id number] No Take 1 tablet (20 mg total) by mouth 3 (three) times daily as needed. Reported on 03/22/2016  Patient not taking: Reported on 10/26/2021   Antonieta IbaGollan, Timothy  J, MD Not Taking Active   rosuvastatin (CRESTOR) 10 MG tablet [id number] Yes Take 1 tablet (10 mg total) by mouth daily at 2 PM. Sherlene Shamsullo, Teresa L, MD Taking Active   Semaglutide,0.25 or 0.5MG /DOS, (OZEMPIC, 0.25 OR 0.5 MG/DOSE,) 2 MG/1.5ML SOPN [id number] Yes Inject 0.25 mg weekly for 4 weeks, then increase to 0.5 mg weekly Sherlene Shamsullo, Teresa L, MD Taking Active   venlafaxine XR (EFFEXOR-XR) 150 MG 24 hr capsule [id number] Yes Take 1 capsule (150 mg total) by mouth daily with breakfast. Sherlene Shamsullo, Teresa L, MD Taking Active   vitamin B-12 (CYANOCOBALAMIN) 500 MCG tablet [id number] Yes Take 500 mcg by mouth daily. [provider] Taking Active Self  vitamin E 400 UNIT capsule [id number] Yes Take 400 Units by mouth daily. [provider] Taking Active Self  Med List Note Warden Fillers(Wright, Latoya S, [place] 03/29/17 1648): **[facility]**            Pertinent Labs:   Lab Results  Component Value Date   HGBA1C 5.9 03/11/2021   Lab Results  Component Value Date   CHOL 120 10/16/2021   HDL 45.50 10/16/2021   LDLCALC 58 10/16/2021   TRIG 82.0 10/16/2021   CHOLHDL 3 10/16/2021   Lab Results  Component Value Date   CREATININE 1.03 10/16/2021   BUN 15 10/16/2021   NA 141 10/16/2021   K 4.3 10/16/2021   CL 104 10/16/2021   CO2 27 10/16/2021    SDOH:  (Social Determinants of Health) assessments and interventions performed:  SDOH Interventions    Flowsheet Row Most Recent Value  SDOH Interventions   Financial Strain Interventions Intervention Not Indicated       CCM Care Plan  Review of patient past medical history, allergies, medications, health status, including review of consultants reports, laboratory and other test data, was performed as part of comprehensive evaluation and provision of chronic care management services.   Care Plan : Medication Management  Updates made by Lourena Simmondsravis, Elige Shouse E, RPH-CPP since 10/26/2021 12:00 AM     Problem: AFib, Hypertension, Obesity      Long-Range  Goal: Disease Progression Prevention   Start Date: 06/26/2021  Recent Progress: On track  Priority: High  Note:   Current Barriers:  Unable to independently afford treatment regimen Unable to achieve control of weight   Pharmacist Clinical Goal(s):  Over the next 90 days, patient will verbalize ability to afford treatment regimen Over the next 90 days, patient will achieve improvement in weight  through collaboration with PharmD and provider.   Interventions: 1:1 collaboration with Sherlene Shamsullo, Teresa L, MD regarding development and update of comprehensive plan of care as evidenced by provider attestation and co-signature Inter-disciplinary care team collaboration (see longitudinal plan of care) Comprehensive medication review performed; medication list updated in electronic medical record  Health Maintenance   Yearly influenza vaccination: due - encouraged to pursue Td/Tdap vaccination: up to date Pneumonia vaccination: up to date COVID vaccinations: due - encouraged to pursue  Shingrix vaccinations: due - encouraged to pursue Colonoscopy: up to date  Overweight/Obesity Complicated by Afib, CHF: Unable to achieve goal weight loss through lifestyle modification alone; current treatment: Ozempic 0.5 mg weekly  Denies GI upset. Reports he is working to used to a different routine/food schedule Medications/Strategies previously tried: history of bariatric surgery in 2014 Baseline weight: 380 lbs; most recent weight: 372 lbs (8 lbs weight loss, 2% weight loss thus far) Current meal patterns: breakfast: 1/2 bagel, lunch/supper: focusing on reducing portion sizes;  Current exercise: goes to the pool 2-3 days weekly, works out in the water 45-60 minutes Recommended to continue current regimen at this time. Discussed that [employer] is not going to be open for patients who are on the medication for an non-indicated condition. Complete current regimen and then discontinue medication if  patient is not interested in paying insurance copay, or if insurance will not cover without a diagnosis of diabetes. Patient amenable.   Atrial Fibrillation w CHF Appropriately managed;  Rate control: metoprolol succinate 25 mg daily, propranolol 20 mg TID PRN - has not needed recently Rhythm control: amiodarone 200 mg BIS Anticoagulant treatment: Pradaxa 150 mg BID- Reports history of Xarelto and Eliquis - both caused allergic reactions Additional antihypertensives: losartan/HCTZ 100/25 mg daily Diuretic: furosemide 40 mg daily PRN, potassium 20 mEq BID- not needing often CHADS2VASc score: 3 (age, HTN, CHF) Recommended to continue current regimen at this time  Depression/Anxiety: Controlled per patient report; current treatment: venlafaxine 75 mg daily, bupropion XL 300 mg daily;  Previously recommended to continue current regimen at this time  Hyperlipidemia and ASCVD risk reduction:  Untreated but likely improved moving forward; current treatment: rosuvastatin 10 mg daily - recently started  Recommended to continue current regimen at this time. LFTs WNL at most recent visit. Follow lipids with next regular labs  Hypothyroidism: Controlled per last lab work; current regimen: levothyroxine 75 mcg daily Previously recommended to continue current regimen at this time  Chronic Pain: Uncontrolled per last provider visit; current regimen: hydrocodone/acetaminophen 10/325 mg TID, docusate 100 mg daily PRN constipation, gabapentin 300 mg TID  Reports worsening neuropathy lately, periodically having days that neuropathy/foot swelling is worsening some evenings. Encouraged elevation Recommended to continue current regimen along with physical activity as able.   Supplements: Multivitamin, glucosamine, Vitamin b12, ferrous gluconate daily, b complex vitamin, Vitamin D No drug interactions noted at this time.  Patient Goals/Self-Care Activities Over the next 90 days, patient will:  - take  medications as prescribed target a minimum of 150 minutes of moderate intensity exercise weekly engage in dietary modifications by moderating carbohydrate portion sizes       Plan: Telephone follow up appointment with care management team member scheduled for:  5 months  [name], PharmD, [place], CPP Clinical Pharmacist [employer] at [facility] [phone]    I have reviewed the above information and agree with above.   Duncan Dull, MD

## 2021-10-26 NOTE — Patient Instructions (Signed)
Johnathan [name],   Keep up the great work!  We recommend you get the influenza vaccine for this season.   We recommend you get the updated bivalent COVID-19 booster, at least 2 months after any prior doses. You may consider delaying a booster dose by 3 months from a prior episode of COVID-19 per the CDC.   We recommend the Shingrix (shingles) vaccine series for all over age 67. It should have a $0 copay on all Medicare plans this year.   You can pursue these three without a prescription at your local pharmacy, or feel free to call our [facility] at [facility] at [phone].  Take care!  Johnathan [name], PharmD  Visit Information  Following are the goals we discussed today:  Patient Goals/Self-Care Activities Over the next 90 days, patient will:  - take medications as prescribed target a minimum of 150 minutes of moderate intensity exercise weekly engage in dietary modifications by moderating carbohydrate portion sizes          Plan: Telephone follow up appointment with care management team member scheduled for:  5 months   Johnathan [name], PharmD, [place], CPP Clinical Pharmacist [facility] at [facility] [phone]   Please call the care guide team at [phone] if you need to cancel or reschedule your appointment.   Patient verbalizes understanding of instructions and care plan provided today and agrees to view in MyChart. Active MyChart status confirmed with patient.

## 2021-11-05 ENCOUNTER — Telehealth: Payer: Self-pay | Admitting: Cardiovascular Disease

## 2021-11-05 NOTE — Telephone Encounter (Signed)
Patient is needing assistance with Pradaza, please assist

## 2021-11-05 NOTE — Telephone Encounter (Signed)
Reach back out to Mr. Grieshop and his wife Natalia Leatherwood, spoke with both via phone, advised on PharmD thoughts on Brilinta to Pradaxa  Brilinta is not a replacement option for Pradaxa, unsure why a representative would have told pt that. Anticoagulant options instead of Pradaxa would be Eliquis, Xarelto, Savaysa, or warfarin. Pt cannot tolerate Eliquis or Xarelto, Savaysa is not on his formulary at all (neither is the Pradaxa which is why his copay is likely $150/month), and warfarin would be cheap but require frequent in office INR monitoring.  Both pt and wife verbalized understanding, are willing to consider switching to warfarin, educated them on frequent INR testing and adjustments on warfarin dosing, RN Kem Kays will follow warfarin/coumadin for pt, also advise she is only in office Wed for days when INR need to be monitor. Both verbalized understanding, and would like script sent to pharmacy. Advised will need confirmation from Dr. Mariah Milling, will also send [facility], RN an Lorain Childes so that she can begin transition and schedule first appt.   Mr. Kittel and Natalia Leatherwood are thankful for the quick f/u call and recommendations, understands to expect another call to follow warfarin transition.   R/t coumadin RN [facility]), Dr. Mariah Milling, and back to PharmD

## 2021-11-05 NOTE — Telephone Encounter (Signed)
Please send in rx for warfarin 5mg  - take 1 tablet daily. He can overlap and take Pradaxa for the next 3 days, then stop (provides some additional anticoag benefit while INR is starting to increase). Starting on day 4, will just be on warfarin 1 tablet daily. Will need new Coumadin appt scheduled at [facility] office next Wednesday, 2/1. Let me know if you need any help scheduling this.

## 2021-11-05 NOTE — Telephone Encounter (Signed)
Brilinta is not a replacement option for Pradaxa, unsure why a representative would have told pt that. Anticoagulant options instead of Pradaxa would be Eliquis, Xarelto, Savaysa, or warfarin. Pt cannot tolerate Eliquis or Xarelto, Savaysa is not on his formulary at all (neither is the Pradaxa which is why his copay is likely $150/month), and warfarin would be cheap but require frequent in office INR monitoring.

## 2021-11-05 NOTE — Telephone Encounter (Signed)
Was able to return call to pt's wife Natalia Leatherwood 9DPR approved), Mr. Fulmore also in background of phone conversation, they report that the patient assistance program they were using to help with cost of Pradaxa is no longer to assit with cost of medication d/t changes this upcoming year with pt's Medicare being a government program.   Natalia Leatherwood did advise after speaking with representative with  [employer]-Patient Assistance Program, they suggested switching to Brilinta "as it just as good as Pradaxa" per wife. States that the program will be able to cover Brilinta to little to no-cost to Mr. Coleman. They are requesting the switch be approved if suitable for Mr. Schanz.  Natalia Leatherwood advised Pradaxa will cost $150 a month for only a 30-day supply, which neither wife of pt can afford as they are retired with little income.  Patient unable to take Eliquis or Xarelto due to allergic reactions  Advised will send to our pharmacy team and Dr. Mariah Milling for POC and will call back soon with recommendations. Wife and pt thankful for return call and will await response.

## 2021-11-06 MED ORDER — WARFARIN SODIUM 5 MG PO TABS: 5.0000 mg | ORAL_TABLET | Freq: Every day | ORAL | 0 refills | Status: DC

## 2021-11-06 NOTE — Telephone Encounter (Signed)
Spoke with pt's wife (DPR approved), advised approval to transition from Pradaxa to warfarin. Went over PharmD instructions  Please send in rx for warfarin 5mg  - take 1 tablet daily. He can overlap and take Pradaxa for the next 3 days, then stop (provides some additional anticoag benefit while INR is starting to increase). Starting on day 4, will just be on warfarin 1 tablet daily. Will need new Coumadin appt scheduled at [facility] office next Wednesday, 2/1.   Script for Warfarin 5 mg daily [facility]. Wife was able to write instructions down and read back for clarification. Verbalized understanding. Appt with Mandi, coumadin RN, next Wed 2/1 at 10:30 am.   Sunday very thankful for the phone calls and the smooth transition progress, will call back with amy further questions.  Pradaxa removed from medication list.

## 2021-11-06 NOTE — Addendum Note (Signed)
Addended by: Maple Hudson on: 11/06/2021 09:30 AM   Modules accepted: Orders

## 2021-11-10 DIAGNOSIS — E7849 Other hyperlipidemia: Secondary | ICD-10-CM | POA: Diagnosis not present

## 2021-11-10 DIAGNOSIS — I4819 Other persistent atrial fibrillation: Secondary | ICD-10-CM

## 2021-11-18 ENCOUNTER — Ambulatory Visit (INDEPENDENT_AMBULATORY_CARE_PROVIDER_SITE_OTHER): Payer: PPO

## 2021-11-18 ENCOUNTER — Other Ambulatory Visit: Payer: Self-pay

## 2021-11-18 ENCOUNTER — Telehealth: Payer: Self-pay | Admitting: Cardiovascular Disease

## 2021-11-18 DIAGNOSIS — I4819 Other persistent atrial fibrillation: Secondary | ICD-10-CM | POA: Diagnosis not present

## 2021-11-18 DIAGNOSIS — Z5181 Encounter for therapeutic drug level monitoring: Secondary | ICD-10-CM

## 2021-11-18 DIAGNOSIS — Z86718 Personal history of other venous thrombosis and embolism: Secondary | ICD-10-CM

## 2021-11-18 LAB — POCT INR: INR: 1.6 — AB (ref 2.0–3.0)

## 2021-11-18 MED ORDER — AMIODARONE HCL 200 MG PO TABS: 200.0000 mg | ORAL_TABLET | Freq: Two times a day (BID) | ORAL | 3 refills | Status: DC

## 2021-11-18 MED ORDER — WARFARIN SODIUM 5 MG PO TABS: 5.0000 mg | ORAL_TABLET | ORAL | 1 refills | Status: DC

## 2021-11-18 NOTE — Telephone Encounter (Signed)
°*  STAT* If patient is at the pharmacy, call can be transferred to refill team.   1. Which medications need to be refilled? (please list name of each medication and dose if known) amiodarone 200 MG 1 tablet twice daily   2. Which pharmacy/location (including street and city if local pharmacy) is [facility] [place]   3. Do they need a 30 day or 90 day supply? 90 day

## 2021-11-18 NOTE — Progress Notes (Signed)
A full discussion of the nature of anticoagulants has been carried out.  A benefit risk analysis has been presented to the patient, so that they understand the justification for choosing anticoagulation at this time. The need for frequent and regular monitoring, precise dosage adjustment and compliance is stressed.  Side effects of potential bleeding are discussed.  The patient should avoid any OTC items containing aspirin or ibuprofen, and should avoid great swings in general diet.  Avoid alcohol consumption.  Call if any signs of abnormal bleeding.  Next PT/INR test in 1 week 

## 2021-11-18 NOTE — Telephone Encounter (Signed)
amiodarone (PACERONE) 200 MG tablet 180 tablet 3 11/18/2021    Sig - Route: Take 1 tablet (200 mg total) by mouth 2 (two) times daily. - Oral    Pharmacy  [id number] [place], [place]

## 2021-11-18 NOTE — Patient Instructions (Signed)
-   START NEW DOSAGE of warfarin 1 tablet every day EXCEPT 2 tablets on MONDAYS, WEDNESDAYS & FRIDAYS.  - Recheck INR in 1 week

## 2021-11-23 ENCOUNTER — Encounter: Payer: Self-pay | Admitting: Internal Medicine

## 2021-11-23 MED ORDER — LOSARTAN POTASSIUM 100 MG PO TABS: 100.0000 mg | ORAL_TABLET | Freq: Every day | ORAL | 1 refills | Status: DC

## 2021-11-23 MED ORDER — BUPROPION HCL ER (XL) 300 MG PO TB24: 300.0000 mg | ORAL_TABLET | Freq: Every day | ORAL | 3 refills | Status: DC

## 2021-11-23 MED ORDER — HYDROCHLOROTHIAZIDE 25 MG PO TABS: 25.0000 mg | ORAL_TABLET | Freq: Every day | ORAL | 3 refills | Status: DC

## 2021-11-23 NOTE — Telephone Encounter (Signed)
Losartan and hctz sent separately to [facility]

## 2021-11-23 NOTE — Addendum Note (Signed)
Addended by: Sherlene Shams on: 11/23/2021 04:49 PM   Modules accepted: Orders

## 2021-11-25 ENCOUNTER — Ambulatory Visit (INDEPENDENT_AMBULATORY_CARE_PROVIDER_SITE_OTHER): Payer: PPO

## 2021-11-25 ENCOUNTER — Other Ambulatory Visit: Payer: Self-pay

## 2021-11-25 DIAGNOSIS — I4819 Other persistent atrial fibrillation: Secondary | ICD-10-CM

## 2021-11-25 DIAGNOSIS — Z5181 Encounter for therapeutic drug level monitoring: Secondary | ICD-10-CM

## 2021-11-25 DIAGNOSIS — Z86718 Personal history of other venous thrombosis and embolism: Secondary | ICD-10-CM | POA: Diagnosis not present

## 2021-11-25 LAB — POCT INR: INR: 2.7 (ref 2.0–3.0)

## 2021-11-25 NOTE — Patient Instructions (Signed)
-   continue dosage of warfarin 1 tablet every day EXCEPT 2 tablets on MONDAYS, [place].  - Recheck INR in 2 weeks

## 2021-12-09 ENCOUNTER — Other Ambulatory Visit: Payer: Self-pay

## 2021-12-09 ENCOUNTER — Ambulatory Visit (INDEPENDENT_AMBULATORY_CARE_PROVIDER_SITE_OTHER): Payer: PPO

## 2021-12-09 DIAGNOSIS — I4819 Other persistent atrial fibrillation: Secondary | ICD-10-CM | POA: Diagnosis not present

## 2021-12-09 DIAGNOSIS — Z86718 Personal history of other venous thrombosis and embolism: Secondary | ICD-10-CM | POA: Diagnosis not present

## 2021-12-09 DIAGNOSIS — Z5181 Encounter for therapeutic drug level monitoring: Secondary | ICD-10-CM | POA: Diagnosis not present

## 2021-12-09 LAB — POCT INR: INR: 3.8 — AB (ref 2.0–3.0)

## 2021-12-09 NOTE — Patient Instructions (Signed)
-   skip warfarin tonight, then ?- continue dosage of warfarin 1 tablet every day EXCEPT 2 tablets on MONDAYS, WEDNESDAYS & FRIDAYS.  ?- Recheck INR in 2 weeks ?TRY TO GET YOUR GREENS ON A SCHEDULE.  PICK A DAY (OR DAYS) AND HAVE THEM ON THE SAME DAY EACH WEEK ?

## 2021-12-13 ENCOUNTER — Other Ambulatory Visit: Payer: Self-pay | Admitting: Internal Medicine

## 2021-12-15 ENCOUNTER — Encounter: Payer: Self-pay | Admitting: Internal Medicine

## 2021-12-15 ENCOUNTER — Ambulatory Visit (INDEPENDENT_AMBULATORY_CARE_PROVIDER_SITE_OTHER): Payer: PPO | Admitting: Internal Medicine

## 2021-12-15 ENCOUNTER — Ambulatory Visit: Payer: Self-pay | Admitting: Pharmacist

## 2021-12-15 ENCOUNTER — Other Ambulatory Visit: Payer: Self-pay

## 2021-12-15 VITALS — BP 130/66 | HR 71 | Temp 98.8°F | Ht 75.0 in | Wt 363.2 lb

## 2021-12-15 DIAGNOSIS — R7303 Prediabetes: Secondary | ICD-10-CM | POA: Diagnosis not present

## 2021-12-15 DIAGNOSIS — D6869 Other thrombophilia: Secondary | ICD-10-CM | POA: Diagnosis not present

## 2021-12-15 DIAGNOSIS — E039 Hypothyroidism, unspecified: Secondary | ICD-10-CM

## 2021-12-15 DIAGNOSIS — G4733 Obstructive sleep apnea (adult) (pediatric): Secondary | ICD-10-CM

## 2021-12-15 DIAGNOSIS — M159 Polyosteoarthritis, unspecified: Secondary | ICD-10-CM

## 2021-12-15 LAB — POCT GLYCOSYLATED HEMOGLOBIN (HGB A1C): Hemoglobin A1C: 5.2 % (ref 4.0–5.6)

## 2021-12-15 MED ORDER — HYDROCODONE-ACETAMINOPHEN 10-325 MG PO TABS: 1.0000 | ORAL_TABLET | Freq: Three times a day (TID) | ORAL | 0 refills | Status: DC | PRN

## 2021-12-15 MED ORDER — ROSUVASTATIN CALCIUM 10 MG PO TABS: 10.0000 mg | ORAL_TABLET | Freq: Every day | ORAL | 3 refills | Status: DC

## 2021-12-15 NOTE — Assessment & Plan Note (Signed)
Managed with warfarin  for embolic stroke risk mitigation due to  atrial fibrillation. Patient has no signs of bleeding and is advised to notify his specialists prior to any procedure that may required suspension of warfarin . He has been advised to contact his cardiologist to discuss alternative covered anticoagulation ? ? ?

## 2021-12-15 NOTE — Chronic Care Management (AMB) (Signed)
?  Chronic Care Management  ? ?Note ? ?12/15/2021 ?Name: Johnathan [name] MRN: [id number] DOB: 1956-03-29 ? ? ? ?Closing pharmacy CCM case at this time. Patient has clinic contact information for future questions or concerns.  ? ?[name], PharmD, [place], CPP ?Clinical Pharmacist ?Nature conservation officer at [employer] ?[phone] ? ?

## 2021-12-15 NOTE — Assessment & Plan Note (Signed)
A1c has normalized with weight loss using Ozempic 0.5 mg weekly ?

## 2021-12-15 NOTE — Assessment & Plan Note (Signed)
He continues to report morning fatigue.   I have explained that this cannot be remedied until his OSA is treated ?

## 2021-12-15 NOTE — Assessment & Plan Note (Signed)
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   I have refilled his hydrocodone for  Use not more than 3 times daily for three months  and advised him to continue to maximize his tylenol use to 2000 mg daily. .  NSAIDs are contraindicated due to history of gastric bypass and use of anticoagulation for embolic stroke risk reduction  

## 2021-12-15 NOTE — Progress Notes (Signed)
Subjective:  Patient ID: Johnathan [name], male    DOB: 01-17-1956  Age: 66 y.o. MRN: [id number]  CC: The primary encounter diagnosis was Prediabetes. 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   This visit occurred during the SARS-CoV-2 public health emergency.  Safety protocols were in place, including screening questions prior to the visit, additional usage of staff PPE, and extensive cleaning of exam room while observing appropriate contact time as indicated for disinfecting solutions.    HPI Johnathan [name] presents for  Chief Complaint  Patient presents with   Follow-up    3 month follow up for medication refills   1) Morbid obesity:  he has had an intentional 32 lb wt loss since last visit in December using Ozempic 0.5 .  Weight has plateaued for the last 2-3 weeks .  Has minimal nausea  but has increased burping,  and constipation.  Using stool softeners and stimulant laxatives.   Has enough to last through April if dose is not increased  2) had another fall while walking around in [place]  crossing the street using the walker .Johnathan [name] got jammed and he was flipped over the walker into the street..  No injuries   3) Chronic Pain:  secondary to DJD with joint replacements, complicated by weight. Refill history confirmed via [place] Controlled Substance databas, accessed by me today..   Outpatient Medications Prior to Visit  Medication Sig Dispense Refill   amiodarone (PACERONE) 200 MG tablet Take 1 tablet (200 mg total) by mouth 2 (two) times daily. 180 tablet 3   B Complex Vitamins (B COMPLEX 50 PO) Take 1 tablet by mouth daily.      buPROPion (WELLBUTRIN XL) 300 MG 24 hr tablet Take 1 tablet (300 mg total) by mouth daily. 90 tablet 3   Cholecalciferol (VITAMIN D3) 2000 UNITS TABS Take 1 tablet  by mouth daily.      docusate sodium (COLACE) 100 MG capsule Take 400 mg by mouth daily.      Ferrous Gluconate 324 (37.5 Fe) MG TABS Take 1 tablet by mouth daily.      furosemide (LASIX) 40 MG tablet Take 1 tablet (40 mg total) by mouth daily as needed. 90 tablet 3   gabapentin (NEURONTIN) 300 MG capsule Take 1 capsule (300 mg total) by mouth 3 (three) times daily. 270 capsule 3   GLUCOSAMINE HCL-MSM PO Take 1 tablet by mouth daily.      hydrochlorothiazide (HYDRODIURIL) 25 MG tablet Take 1 tablet (25 mg total) by mouth daily. 90 tablet 3   levothyroxine (SYNTHROID) 25 MCG tablet Take 1 tablet (25 mcg total) by mouth daily before breakfast. 90 tablet 3   losartan (COZAAR) 100 MG tablet Take 1 tablet (100 mg total) by mouth daily. 90 tablet 1   metoprolol succinate (TOPROL-XL) 25 MG 24 hr tablet TAKE 1 TABLET BY MOUTH  DAILY 90 tablet 3   Multiple Vitamin (MULTIVITAMIN) tablet Take 1 tablet by mouth daily.     nitroGLYCERIN (NITROSTAT) 0.4 MG SL tablet Place 1 tablet (0.4 mg total) under the tongue every 5 (five) minutes as needed for chest pain. 30 tablet 3   Potassium Chloride ER 20 MEQ TBCR Take 20 mEq by mouth 2 (two) times daily as needed. 180 tablet 3   propranolol (INDERAL) 20 MG tablet Take 1 tablet (  20 mg total) by mouth 3 (three) times daily as needed. Reported on 03/22/2016 270 tablet 3   Semaglutide,0.25 or 0.5MG /DOS, (OZEMPIC, 0.25 OR 0.5 MG/DOSE,) 2 MG/1.5ML SOPN Inject 0.25 mg weekly for 4 weeks, then increase to 0.5 mg weekly 1.5 mL 0   venlafaxine XR (EFFEXOR-XR) 150 MG 24 hr capsule Take 1 capsule (150 mg total) by mouth daily with breakfast. 90 capsule 0   vitamin B-12 (CYANOCOBALAMIN) 500 MCG tablet Take 500 mcg by mouth daily.     vitamin E 400 UNIT capsule Take 400 Units by mouth daily.     warfarin (COUMADIN) 5 MG tablet Take 1 tablet (5 mg total) by mouth as directed. Take 1-2 tablets every day as directed by the anti-coag clinic. 45 tablet 1   HYDROcodone-acetaminophen  (NORCO) 10-325 MG tablet Take 1 tablet by mouth every 8 (eight) hours as needed. 90 tablet 0   HYDROcodone-acetaminophen (NORCO) 10-325 MG tablet Take 1 tablet by mouth every 8 (eight) hours as needed. 90 tablet 0   HYDROcodone-acetaminophen (NORCO) 10-325 MG tablet Take 1 tablet by mouth every 8 (eight) hours as needed. 90 tablet 0   rosuvastatin (CRESTOR) 10 MG tablet TAKE 1 TABLET (10 MG TOTAL) BY MOUTH DAILY AT 2 PM. 90 tablet 0   No facility-administered medications prior to visit.    Review of Systems;  Patient denies headache, fevers, malaise, unintentional weight loss, skin rash, eye pain, sinus congestion and sinus pain, sore throat, dysphagia,  hemoptysis , cough, dyspnea, wheezing, chest pain, palpitations, orthopnea, edema, abdominal pain, nausea, melena, diarrhea, constipation, flank pain, dysuria, hematuria, urinary  Frequency, nocturia, numbness, tingling, seizures,  Focal weakness, Loss of consciousness,  Tremor, insomnia, depression, anxiety, and suicidal ideation.      Objective:  BP 130/66 (BP Location: Left Arm, Patient Position: Sitting, Cuff Size: Large)    Pulse 71    Temp 98.8 F (37.1 C) (Oral)    Ht 6\' 3"  (1.905 m)    Wt (!) 363 lb 3.2 oz (164.7 kg)    SpO2 96%    BMI 45.40 kg/m   BP Readings from Last 3 Encounters:  12/15/21 130/66  09/16/21 140/82  06/17/21 140/84    Wt Readings from Last 3 Encounters:  12/15/21 (!) 363 lb 3.2 oz (164.7 kg)  09/16/21 (!) 394 lb 12.8 oz (179.1 kg)  06/17/21 (!) 380 lb 9.6 oz (172.6 kg)    General appearance: alert, cooperative and appears stated age Ears: normal TM's and external ear canals both ears Throat: lips, mucosa, and tongue normal; teeth and gums normal Neck: no adenopathy, no carotid bruit, supple, symmetrical, trachea midline and thyroid not enlarged, symmetric, no tenderness/mass/nodules Back: symmetric, no curvature. ROM normal. No CVA tenderness. Lungs: clear to auscultation bilaterally Heart: regular rate  and rhythm, S1, S2 normal, no murmur, click, rub or gallop Abdomen: soft, non-tender; bowel sounds normal; no masses,  no organomegaly Pulses: 2+ and symmetric Skin: Skin color, texture, turgor normal. No rashes or lesions Lymph nodes: Cervical, supraclavicular, and axillary nodes normal.  Lab Results  Component Value Date   HGBA1C 5.2 12/15/2021   HGBA1C 5.9 03/11/2021   HGBA1C 5.6 05/29/2020    Lab Results  Component Value Date   CREATININE 1.03 10/16/2021   CREATININE 1.13 03/11/2021   CREATININE 1.07 09/29/2020    Lab Results  Component Value Date   WBC 7.3 09/29/2020   HGB 15.2 09/29/2020   HCT 44.9 09/29/2020   PLT 160 09/29/2020   GLUCOSE 103 (H)  10/16/2021   CHOL 120 10/16/2021   TRIG 82.0 10/16/2021   HDL 45.50 10/16/2021   LDLCALC 58 10/16/2021   ALT 27 10/16/2021   AST 24 10/16/2021   NA 141 10/16/2021   K 4.3 10/16/2021   CL 104 10/16/2021   CREATININE 1.03 10/16/2021   BUN 15 10/16/2021   CO2 27 10/16/2021   TSH 3.27 10/16/2021   PSA 0.68 10/16/2021   INR 3.8 (A) 12/09/2021   HGBA1C 5.2 12/15/2021    DG Knee Right Port  Result Date: 06/20/2017 CLINICAL DATA:  Status post total knee replaced EXAM: PORTABLE RIGHT KNEE - 1-2 VIEW COMPARISON:  None. FINDINGS: Right total knee arthroplasty in satisfactory position. No fracture or dislocation is seen. Associated surgical drain. Overlying skin staples. IMPRESSION: Right total knee arthroplasty in satisfactory position. Electronically Signed   By: Charline Bills M.D.   On: 06/20/2017 11:33    Assessment & Plan:   Problem List Items Addressed This Visit     Acquired hypothyroidism   Relevant Orders   TSH   Acquired thrombophilia (HCC)    Managed with warfarin  for embolic stroke risk mitigation due to  atrial fibrillation. Patient has no signs of bleeding and is advised to notify his specialists prior to any procedure that may required suspension of warfarin . 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   I have refilled his hydrocodone for  Use not more than 3 times daily for three months  and advised him to continue to maximize his tylenol use to 2000 mg daily. .  NSAIDs are contraindicated due to history of gastric bypass and use of anticoagulation for embolic stroke risk reduction       Relevant Medications   HYDROcodone-acetaminophen (NORCO) 10-325 MG tablet   HYDROcodone-acetaminophen (NORCO) 10-325 MG tablet   HYDROcodone-acetaminophen (NORCO) 10-325 MG tablet   Morbid obesity (HCC)    Weight has plateaued after 32 lb s on 0.5 mg ozempic.  He has samples to last through April but has been advised today to increase his weekly dose to 1 mg .  Side effects of constipation and burping reported and discussed      Relevant Orders   POCT HgB A1C (Completed)   Lipid panel   Comprehensive metabolic panel   OSA (obstructive sleep apnea)    He continues to report morning fatigue.   I have explained that this cannot be remedied until his OSA is treated      Prediabetes - Primary    A1c has normalized with weight loss using Ozempic 0.5 mg weekly      Relevant Orders   POCT HgB A1C (Completed)   Hemoglobin A1c    I spent 30 minutes dedicated to the care of this patient on the date of this encounter to include pre-visit review of patient's medical history,  most recent imaging studies, Face-to-face time with the patient , and post visit ordering of testing and therapeutics.  Follow-up: No  follow-ups on file.   Sherlene Shams, MD

## 2021-12-15 NOTE — Patient Instructions (Addendum)
Ozempic can aggravated indigestion and constipation,  because it slows down the gastric emptying and the intestinal transit of food. ? ?There are 4 categories of laxatives.  They can be combined,  But some should not be used daily . ? ?Bulk  forming laxatives   Ok to use daily  Citrucel, benefiber, metamucil, Fibercon, miralax) ? ?Stool softener (docusate ; there's only one) :  ok to use daily  ? ? ?Stimulant laxatives (Ex Lax,  Correctol,  Senna,  Dulcolax)  :  avoid on a daily basis, not more than 2/week if you can avoid  ?Cathartic laxatives ( MOM,  Mag citrate, Lactulose ) : not more than 2 /week  ? ? ?Ok to take plain  magnesium or magnesium citrate  in capsule form  250 mg dose   With or without the stool softener daily   ? ? ?If you can tolerate the 1 mg dose of ozempic ,  let me know .  ?

## 2021-12-15 NOTE — Patient Instructions (Signed)
Hi Ed,  ? ?Unfortunately, I am being asked to quickly transition into another role within the health system, so I am unable to keep our next appointment in May. Please continue to follow up with your primary care provider as scheduled.  ? ?It has been a pleasure working with you! ? ?Catie Darnelle Maffucci, PharmD ? ?

## 2021-12-15 NOTE — Assessment & Plan Note (Addendum)
Weight has plateaued after 32 lb s on 0.5 mg ozempic.  He has samples to last through April but has been advised today to increase his weekly dose to 1 mg .  Side effects of constipation and burping reported and discussed ?

## 2021-12-16 ENCOUNTER — Other Ambulatory Visit: Payer: Self-pay | Admitting: Cardiovascular Disease

## 2021-12-22 ENCOUNTER — Other Ambulatory Visit: Payer: Self-pay | Admitting: Internal Medicine

## 2021-12-23 ENCOUNTER — Other Ambulatory Visit: Payer: Self-pay

## 2021-12-23 ENCOUNTER — Ambulatory Visit (INDEPENDENT_AMBULATORY_CARE_PROVIDER_SITE_OTHER): Payer: PPO

## 2021-12-23 DIAGNOSIS — Z5181 Encounter for therapeutic drug level monitoring: Secondary | ICD-10-CM | POA: Diagnosis not present

## 2021-12-23 DIAGNOSIS — Z86718 Personal history of other venous thrombosis and embolism: Secondary | ICD-10-CM | POA: Diagnosis not present

## 2021-12-23 DIAGNOSIS — I4819 Other persistent atrial fibrillation: Secondary | ICD-10-CM | POA: Diagnosis not present

## 2021-12-23 LAB — POCT INR: INR: 4.1 — AB (ref 2.0–3.0)

## 2021-12-23 MED ORDER — HYDROCODONE-ACETAMINOPHEN 10-325 MG PO TABS: 1.0000 | ORAL_TABLET | Freq: Three times a day (TID) | ORAL | 0 refills | Status: DC | PRN

## 2021-12-23 NOTE — Telephone Encounter (Signed)
Hydrocodone rx sent for #69 tablets  ?

## 2021-12-23 NOTE — Telephone Encounter (Signed)
Pt sent a mychart message stating that he is out of his Hydrocodone. Called pharmacy and she stated that on 12/15/2021 the insurance only allowed them to fill a 7 day supply and they will not fill the additional 69 tablets because there is not a rx for it at the pharmacy. They do have the next two months but insurance will not allow one of those to be used because it is for a full 30 day rx. Pharmacist stated that we can send over a rx for the 69 tablets and she can get that filled.  ?

## 2021-12-23 NOTE — Patient Instructions (Signed)
-   skip warfarin tonight & tomorrow, then ?-  on Friday,START NEW dosage of warfarin 1 tablet every day EXCEPT 1.5 tablets on MONDAYS, WEDNESDAYS & FRIDAYS.  ?- Recheck INR in 2 weeks ?TRY TO GET YOUR GREENS ON A SCHEDULE.  PICK A DAY (OR DAYS) AND HAVE THEM ON THE SAME DAY EACH WEEK ?

## 2021-12-25 NOTE — Telephone Encounter (Signed)
You have refilled this script but it will not let me refuse. ?

## 2022-01-01 ENCOUNTER — Encounter: Payer: Self-pay | Admitting: Internal Medicine

## 2022-01-03 ENCOUNTER — Other Ambulatory Visit: Payer: Self-pay | Admitting: Cardiovascular Disease

## 2022-01-04 ENCOUNTER — Telehealth: Payer: Self-pay

## 2022-01-04 NOTE — Telephone Encounter (Signed)
refill 

## 2022-01-04 NOTE — Telephone Encounter (Signed)
Look into ozempic ?

## 2022-01-06 ENCOUNTER — Ambulatory Visit (INDEPENDENT_AMBULATORY_CARE_PROVIDER_SITE_OTHER): Payer: PPO

## 2022-01-06 ENCOUNTER — Other Ambulatory Visit: Payer: Self-pay

## 2022-01-06 DIAGNOSIS — Z5181 Encounter for therapeutic drug level monitoring: Secondary | ICD-10-CM

## 2022-01-06 DIAGNOSIS — Z86718 Personal history of other venous thrombosis and embolism: Secondary | ICD-10-CM | POA: Diagnosis not present

## 2022-01-06 DIAGNOSIS — I4819 Other persistent atrial fibrillation: Secondary | ICD-10-CM

## 2022-01-06 LAB — POCT INR: INR: 2.4 (ref 2.0–3.0)

## 2022-01-06 NOTE — Patient Instructions (Signed)
-   continue dosage of warfarin 1 tablet every day EXCEPT 1.5 tablets on MONDAYS, WEDNESDAYS & FRIDAYS.  ?- Recheck INR in 3 weeks ?TRY TO GET YOUR GREENS ON A SCHEDULE.  PICK A DAY (OR DAYS) AND HAVE THEM ON THE SAME DAY EACH WEEK ?

## 2022-01-11 ENCOUNTER — Other Ambulatory Visit: Payer: Self-pay

## 2022-01-11 ENCOUNTER — Encounter: Payer: Self-pay | Admitting: Internal Medicine

## 2022-01-11 MED ORDER — VENLAFAXINE HCL ER 150 MG PO CP24: 150.0000 mg | ORAL_CAPSULE | Freq: Every day | ORAL | 0 refills | Status: DC

## 2022-01-12 NOTE — Telephone Encounter (Signed)
Pt scheduled appt w/ pcp to discuss ?

## 2022-01-16 ENCOUNTER — Other Ambulatory Visit: Payer: Self-pay | Admitting: Internal Medicine

## 2022-01-18 ENCOUNTER — Encounter: Payer: Self-pay | Admitting: Internal Medicine

## 2022-01-18 ENCOUNTER — Telehealth (INDEPENDENT_AMBULATORY_CARE_PROVIDER_SITE_OTHER): Payer: PPO | Admitting: Internal Medicine

## 2022-01-18 MED ORDER — SEMAGLUTIDE-WEIGHT MANAGEMENT 1 MG/0.5ML ~~LOC~~ SOAJ: 1.0000 mg | SUBCUTANEOUS | 2 refills | Status: DC

## 2022-01-18 NOTE — Assessment & Plan Note (Addendum)
Weight has continued to drop with increased dose of 1.0 mg weekly  ozempic. His supply from [employer]  Is ending  For Ozempic as he does not have a diagnosis of diabetes.  Sending 1.0 mg dose of Wegovy to [employer] and contacting [employer] with [facility] for assistance in looking for potential avenues of assistance ? ?  Per Catie's last note I nJan 2023): "(Discussed that [employer] is not going to be open for patients who are on the medication for an non-indicated condition. Complete current regimen and then discontinue medication if patient is not interested in paying insurance copay, or if insurance will not cover without a diagnosis of diabetes. Patient amenable." ?

## 2022-01-18 NOTE — Progress Notes (Signed)
Virtual Visit via CA[facility]ilty Note ? ?This visit type was conducted due to national recommendations for restrictions regarding the COVID-19 pandemic (e.g. social distancing).  This format is felt to be most appropriate for this patient at this time.  All issues noted in this document were discussed and addressed.  No physical exam was performed (except for noted visual exam findings with Video Visits).  ? ?I connected withNAME@ on 01/18/22 at  4:30 PM EDT by a video enabled telemedicine application  and verified that I am speaking with the correct person using two identifiers. ?Location patient: home ?Location provider: work or home office ?Persons participating in the virtual visit: patient, provider ? ?I discussed the limitations, risks, security and privacy concerns of performing an evaluation and management service by telephone and the availability of in person appointments. I also discussed with the patient that there may be a patient responsible charge related to this service. The patient expressed understanding and agreed to proceed. ? ? ?Reason for visit: obesity management  ? ?HPI: ? ?66 yr old male with morbid obesity,  BMI >45 ,  untreated OSA, chronic DJD s/p bilateral knee replacement, frequent falls,  atrial fibrillation  and hypertension presents for follow up on drug therapy for obesity.  Has been taking Ozempic since December with several dose titrations,  currently taking 1 mg weekly , and has lost 50 lbs.  He is ambulating better,  falling less and feels generally much more optimistic about his health and life since he has been able to reduce his weight from 394 to 344 lbs.  He is working out at [employer] 3 times a week in the water.  His goal is to get below 300 lbs,  but unfortunately he has run out of medication and is requesting a change to [facility] to continue therapy .   ? ? ?ROS: See pertinent positives and negatives per HPI. ? ?Past Medical History:  ?Diagnosis Date  ? Arthritis   ? a. chronic  joint pain  ? B12 deficiency   ? Chronic systolic CHF (congestive heart failure) (HCC)   ? a. echo 2013: EF of 50-55%, normal right ventricular systolic pressure, normal left atrium; b. EF 40-45%, inadequate for LV wall motion, mild MR, LA severely dilated @ 52 mm, PASP nl  ? Erythema migrans (Lyme disease) 04/08/2015  ? History of DVT (deep vein thrombosis) 2004  ? s/p bariatric surgery  ? History of stress test   ? a. there was no ST segment deviation noted during stress, ?   There is a small defect of moderate severity present in the apex location, suggestive of apical ischemia, intermediate risk, calculated EF 21% but visually appears to be 35-40%  ? Hypertension   ? Hypothyroidism   ? Neuropathy   ? Obesity   ? OSA (obstructive sleep apnea)   ? a. not compliant with CPAP  ? PAF (paroxysmal atrial fibrillation) (HCC)   ? a. CHADSVASc at least 2 (HTN and vascular disease); b. on Eliquis   ? ? ?Past Surgical History:  ?Procedure Laterality Date  ? ARTHROSCOPIC REPAIR ACL Bilateral 1993  ? bilateral knee x2 on left and x2 on right  ? BARIATRIC SURGERY  2004  ? Rou en Y   ? BARIATRIC SURGERY    ? CHOLECYSTECTOMY  1982  ? ELECTROPHYSIOLOGIC STUDY N/A 06/20/2015  ? Procedure: CARDIOVERSION;  Surgeon: Antonieta Iba, MD;  Location: [facility] ORS;  Service: Cardiovascular;  Laterality: N/A;  ? KNEE ARTHROPLASTY Right 06/20/2017  ?  Procedure: COMPUTER ASSISTED TOTAL KNEE ARTHROPLASTY;  Surgeon: Donato HeinzHooten, James P, MD;  Location: [facility] ORS;  Service: Orthopedics;  Laterality: Right;  ? MENISCUS DEBRIDEMENT Left 2011  ? Left knee  ? REPLACEMENT TOTAL KNEE Left 07/2013  ? TONSILLECTOMY    ? ? ?Family History  ?Problem Relation Age of Onset  ? Dementia Mother   ? Diabetes Father   ? Alzheimer's disease Father   ? ? ?SOCIAL HX:  reports that he has never smoked. He has never used smokeless tobacco. He reports current alcohol use of about 2.0 standard drinks per week. He reports that he does not use drugs.  ? ? ?Current Outpatient  Medications:  ?  amiodarone (PACERONE) 200 MG tablet, Take 1 tablet (200 mg total) by mouth 2 (two) times daily., Disp: 180 tablet, Rfl: 3 ?  B Complex Vitamins (B COMPLEX 50 PO), Take 1 tablet by mouth daily. , Disp: , Rfl:  ?  buPROPion (WELLBUTRIN XL) 300 MG 24 hr tablet, Take 1 tablet (300 mg total) by mouth daily., Disp: 90 tablet, Rfl: 3 ?  Cholecalciferol (VITAMIN D3) 2000 UNITS TABS, Take 1 tablet by mouth daily. , Disp: , Rfl:  ?  docusate sodium (COLACE) 100 MG capsule, Take 400 mg by mouth daily. , Disp: , Rfl:  ?  Ferrous Gluconate 324 (37.5 Fe) MG TABS, Take 1 tablet by mouth daily. , Disp: , Rfl:  ?  furosemide (LASIX) 40 MG tablet, Take 1 tablet (40 mg total) by mouth daily as needed., Disp: 90 tablet, Rfl: 3 ?  gabapentin (NEURONTIN) 300 MG capsule, Take 1 capsule (300 mg total) by mouth 3 (three) times daily., Disp: 270 capsule, Rfl: 3 ?  GLUCOSAMINE HCL-MSM PO, Take 1 tablet by mouth daily. , Disp: , Rfl:  ?  hydrochlorothiazide (HYDRODIURIL) 25 MG tablet, Take 1 tablet (25 mg total) by mouth daily., Disp: 90 tablet, Rfl: 3 ?  HYDROcodone-acetaminophen (NORCO) 10-325 MG tablet, Take 1 tablet by mouth every 8 (eight) hours as needed., Disp: 90 tablet, Rfl: 0 ?  HYDROcodone-acetaminophen (NORCO) 10-325 MG tablet, Take 1 tablet by mouth every 8 (eight) hours as needed., Disp: 90 tablet, Rfl: 0 ?  HYDROcodone-acetaminophen (NORCO) 10-325 MG tablet, Take 1 tablet by mouth every 8 (eight) hours as needed. PLEASE FILL NOW.  REMAINDER OF 30 DAY SUPPLY, Disp: 69 tablet, Rfl: 0 ?  levothyroxine (SYNTHROID) 25 MCG tablet, Take 1 tablet (25 mcg total) by mouth daily before breakfast., Disp: 90 tablet, Rfl: 3 ?  losartan (COZAAR) 100 MG tablet, Take 1 tablet (100 mg total) by mouth daily., Disp: 90 tablet, Rfl: 1 ?  metoprolol succinate (TOPROL-XL) 25 MG 24 hr tablet, TAKE 1 TABLET BY MOUTH DAILY. PLEASE SCHEDULE OFFICE VISIT FOR FURTHER REFILLS. THANK YOU!, Disp: 90 tablet, Rfl: 0 ?  Multiple Vitamin  (MULTIVITAMIN) tablet, Take 1 tablet by mouth daily., Disp: , Rfl:  ?  nitroGLYCERIN (NITROSTAT) 0.4 MG SL tablet, Place 1 tablet (0.4 mg total) under the tongue every 5 (five) minutes as needed for chest pain., Disp: 30 tablet, Rfl: 3 ?  Potassium Chloride ER 20 MEQ TBCR, Take 20 mEq by mouth 2 (two) times daily as needed., Disp: 180 tablet, Rfl: 3 ?  propranolol (INDERAL) 20 MG tablet, Take 1 tablet (20 mg total) by mouth 3 (three) times daily as needed. Reported on 03/22/2016, Disp: 270 tablet, Rfl: 3 ?  rosuvastatin (CRESTOR) 10 MG tablet, Take 1 tablet (10 mg total) by mouth daily at 2 PM., Disp:  90 tablet, Rfl: 3 ?  Semaglutide-Weight Management 1 MG/0.5ML SOAJ, Inject 1 mg into the skin once a week., Disp: 2 mL, Rfl: 2 ?  venlafaxine XR (EFFEXOR-XR) 150 MG 24 hr capsule, Take 1 capsule (150 mg total) by mouth daily with breakfast., Disp: 90 capsule, Rfl: 0 ?  vitamin B-12 (CYANOCOBALAMIN) 500 MCG tablet, Take 500 mcg by mouth daily., Disp: , Rfl:  ?  vitamin E 400 UNIT capsule, Take 400 Units by mouth daily., Disp: , Rfl:  ?  warfarin (COUMADIN) 5 MG tablet, TAKE 1 OR 2 TABLETS BY MOUTH EVERY DAY AS DIRECTED BY ANTI-COAG CLINIC, Disp: 45 tablet, Rfl: 1 ? ?EXAM: ? ?VITALS per patient if applicable: ? ?GENERAL: alert, oriented, appears well and in no acute distress ? ?HEENT: atraumatic, conjunttiva clear, no obvious abnormalities on inspection of external nose and ears ? ?NECK: normal movements of the head and neck ? ?LUNGS: on inspection no signs of respiratory distress, breathing rate appears normal, no obvious gross SOB, gasping or wheezing ? ?CV: no obvious cyanosis ? ?MS: moves all visible extremities without noticeable abnormality ? ?PSYCH/NEURO: pleasant and cooperative, no obvious depression or anxiety, speech and thought processing grossly intact ? ?ASSESSMENT AND PLAN: ? ?Discussed the following assessment and plan: ? ?Morbid obesity (HCC) ? ?Morbid obesity (HCC) ?Weight has continued to drop with  increased dose of 1.0 mg weekly  ozempic. His supply from [employer]  Is ending  For Ozempic as he does not have a diagnosis of diabetes.  Sending 1.0 mg dose of Wegovy to [employer] and contacting [employer] with [facility] for

## 2022-01-19 MED ORDER — HYDROCODONE-ACETAMINOPHEN 10-325 MG PO TABS: 1.0000 | ORAL_TABLET | Freq: Three times a day (TID) | ORAL | 0 refills | Status: DC | PRN

## 2022-01-27 ENCOUNTER — Ambulatory Visit (INDEPENDENT_AMBULATORY_CARE_PROVIDER_SITE_OTHER): Payer: PPO

## 2022-01-27 DIAGNOSIS — I4819 Other persistent atrial fibrillation: Secondary | ICD-10-CM | POA: Diagnosis not present

## 2022-01-27 DIAGNOSIS — Z86718 Personal history of other venous thrombosis and embolism: Secondary | ICD-10-CM

## 2022-01-27 DIAGNOSIS — Z5181 Encounter for therapeutic drug level monitoring: Secondary | ICD-10-CM | POA: Diagnosis not present

## 2022-01-27 LAB — POCT INR: INR: 2.7 (ref 2.0–3.0)

## 2022-01-27 NOTE — Patient Instructions (Signed)
-   continue dosage of warfarin 1 tablet every day EXCEPT 1.5 tablets on MONDAYS, WEDNESDAYS & FRIDAYS.  ?- Recheck INR in 6 weeks ?TRY TO GET YOUR GREENS ON A SCHEDULE.  PICK A DAY (OR DAYS) AND HAVE THEM ON THE SAME DAY EACH WEEK ?

## 2022-01-28 ENCOUNTER — Other Ambulatory Visit: Payer: Self-pay | Admitting: Cardiovascular Disease

## 2022-01-28 DIAGNOSIS — I4819 Other persistent atrial fibrillation: Secondary | ICD-10-CM

## 2022-01-28 NOTE — Telephone Encounter (Signed)
Please review

## 2022-01-28 NOTE — Telephone Encounter (Signed)
Prescription refill request received for warfarin ?Lov: 03/04/21 Johnathan [name])  ?Next INR check: 03/10/22 ?Warfarin tablet strength: 5mg  ? ?Appropriate dose and refill sent to requested pharmacy.  ?

## 2022-02-01 ENCOUNTER — Telehealth: Payer: Self-pay

## 2022-02-01 NOTE — Telephone Encounter (Signed)
PA for Wegovy has been submitted on covermymeds.  °

## 2022-02-11 ENCOUNTER — Telehealth: Payer: PPO

## 2022-02-15 NOTE — Telephone Encounter (Signed)
Johnathan [name] was denied but I have submitted an appeal. Waiting on response from insurance.  ?

## 2022-02-22 ENCOUNTER — Ambulatory Visit (INDEPENDENT_AMBULATORY_CARE_PROVIDER_SITE_OTHER): Payer: PPO

## 2022-02-22 VITALS — Ht 75.0 in | Wt 344.0 lb

## 2022-02-22 DIAGNOSIS — Z0000 Encounter for general adult medical examination without abnormal findings: Secondary | ICD-10-CM | POA: Diagnosis not present

## 2022-02-22 NOTE — Progress Notes (Cosign Needed Addendum)
Subjective:   Johnathan [name] is a 66 y.o. male who presents for an Initial Medicare Annual Wellness Visit.  Review of Systems    No ROS.  Medicare Wellness Virtual Visit.  Visual/audio telehealth visit, UTA vital signs.   See social history for additional risk factors.   Cardiac Risk Factors include: advanced age (>28men, >5 women);male gender     Objective:    Today's Vitals   02/22/22 1124  Weight: (!) 344 lb (156 kg)  Height: 6\' 3"  (1.905 m)   Body mass index is 43 kg/m.     02/22/2022   11:33 AM 06/20/2017    6:24 AM 06/09/2017   10:38 AM 06/20/2015    6:43 AM  Advanced Directives  Does Patient Have a Medical Advance Directive? Yes No No No  Type of 08/20/2015 of [place];Living will     Does patient want to make changes to medical advance directive? No - Patient declined     Copy of Healthcare Power of Attorney in Chart? No - copy requested     Would patient like information on creating a medical advance directive?  No - Patient declined No - Patient declined No - patient declined information   Current Medications (verified) Outpatient Encounter Medications as of 02/22/2022  Medication Sig   HYDROcodone-acetaminophen (NORCO) 10-325 MG tablet Take 1 tablet by mouth every 8 (eight) hours as needed.   warfarin (COUMADIN) 5 MG tablet TAKE 1 OR 2 TABLETS BY MOUTH EVERY DAY AS DIRECTED BY ANTI-COAG CLINIC   amiodarone (PACERONE) 200 MG tablet Take 1 tablet (200 mg total) by mouth 2 (two) times daily.   B Complex Vitamins (B COMPLEX 50 PO) Take 1 tablet by mouth daily.    buPROPion (WELLBUTRIN XL) 300 MG 24 hr tablet Take 1 tablet (300 mg total) by mouth daily.   Cholecalciferol (VITAMIN D3) 2000 UNITS TABS Take 1 tablet by mouth daily.    docusate sodium (COLACE) 100 MG capsule Take 400 mg by mouth daily.    Ferrous Gluconate 324 (37.5 Fe) MG TABS Take 1 tablet by mouth daily.    furosemide (LASIX) 40 MG tablet Take 1 tablet (40 mg total) by mouth  daily as needed.   gabapentin (NEURONTIN) 300 MG capsule Take 1 capsule (300 mg total) by mouth 3 (three) times daily.   GLUCOSAMINE HCL-MSM PO Take 1 tablet by mouth daily.    hydrochlorothiazide (HYDRODIURIL) 25 MG tablet Take 1 tablet (25 mg total) by mouth daily.   HYDROcodone-acetaminophen (NORCO) 10-325 MG tablet Take 1 tablet by mouth every 8 (eight) hours as needed.   HYDROcodone-acetaminophen (NORCO) 10-325 MG tablet Take 1 tablet by mouth every 8 (eight) hours as needed. PLEASE FILL NOW.  REMAINDER OF 30 DAY SUPPLY   levothyroxine (SYNTHROID) 25 MCG tablet Take 1 tablet (25 mcg total) by mouth daily before breakfast.   losartan (COZAAR) 100 MG tablet Take 1 tablet (100 mg total) by mouth daily.   metoprolol succinate (TOPROL-XL) 25 MG 24 hr tablet TAKE 1 TABLET BY MOUTH DAILY. PLEASE SCHEDULE OFFICE VISIT FOR FURTHER REFILLS. THANK YOU!   Multiple Vitamin (MULTIVITAMIN) tablet Take 1 tablet by mouth daily.   nitroGLYCERIN (NITROSTAT) 0.4 MG SL tablet Place 1 tablet (0.4 mg total) under the tongue every 5 (five) minutes as needed for chest pain.   Potassium Chloride ER 20 MEQ TBCR Take 20 mEq by mouth 2 (two) times daily as needed.   propranolol (INDERAL) 20 MG tablet Take 1  tablet (20 mg total) by mouth 3 (three) times daily as needed. Reported on 03/22/2016   rosuvastatin (CRESTOR) 10 MG tablet Take 1 tablet (10 mg total) by mouth daily at 2 PM.   Semaglutide-Weight Management 1 MG/0.5ML SOAJ Inject 1 mg into the skin once a week.   venlafaxine XR (EFFEXOR-XR) 150 MG 24 hr capsule Take 1 capsule (150 mg total) by mouth daily with breakfast.   vitamin B-12 (CYANOCOBALAMIN) 500 MCG tablet Take 500 mcg by mouth daily.   vitamin E 400 UNIT capsule Take 400 Units by mouth daily.   No facility-administered encounter medications on file as of 02/22/2022.   Allergies (verified) Eliquis [apixaban] and Xarelto [rivaroxaban]   History: Past Medical History:  Diagnosis Date   Arthritis    a.  chronic joint pain   B12 deficiency    Chronic systolic CHF (congestive heart failure) (HCC)    a. echo 2013: EF of 50-55%, normal right ventricular systolic pressure, normal left atrium; b. EF 40-45%, inadequate for LV wall motion, mild MR, LA severely dilated @ 52 mm, PASP nl   Erythema migrans (Lyme disease) 04/08/2015   History of DVT (deep vein thrombosis) 2004   s/p bariatric surgery   History of stress test    a. there was no ST segment deviation noted during stress,    There is a small defect of moderate severity present in the apex location, suggestive of apical ischemia, intermediate risk, calculated EF 21% but visually appears to be 35-40%   Hypertension    Hypothyroidism    Neuropathy    Obesity    OSA (obstructive sleep apnea)    a. not compliant with CPAP   PAF (paroxysmal atrial fibrillation) (HCC)    a. CHADSVASc at least 2 (HTN and vascular disease); b. on Eliquis    Past Surgical History:  Procedure Laterality Date   ARTHROSCOPIC REPAIR ACL Bilateral 1993   bilateral knee x2 on left and x2 on right   BARIATRIC SURGERY  2004   Rou en Y    BARIATRIC SURGERY     CHOLECYSTECTOMY  1982   ELECTROPHYSIOLOGIC STUDY N/A 06/20/2015   Procedure: CARDIOVERSION;  Surgeon: Antonieta Ibaimothy J Gollan, MD;  Location: [facility] ORS;  Service: Cardiovascular;  Laterality: N/A;   KNEE ARTHROPLASTY Right 06/20/2017   Procedure: COMPUTER ASSISTED TOTAL KNEE ARTHROPLASTY;  Surgeon: Donato HeinzHooten, James P, MD;  Location: [facility] ORS;  Service: Orthopedics;  Laterality: Right;   MENISCUS DEBRIDEMENT Left 2011   Left knee   REPLACEMENT TOTAL KNEE Left 07/2013   TONSILLECTOMY     Family History  Problem Relation Age of Onset   Dementia Mother    Diabetes Father    Alzheimer's disease Father    Social History   Socioeconomic History   Marital status: Married    Spouse name: Not on file   Number of children: Not on file   Years of education: Not on file   Highest education level: Not on file  Occupational  History   Not on file  Tobacco Use   Smoking status: Never   Smokeless tobacco: Never  Vaping Use   Vaping Use: Never used  Substance and Sexual Activity   Alcohol use: Yes    Alcohol/week: 2.0 standard drinks    Types: 2 Shots of liquor per week    Comment: Vodka and lemonade occasionally 2-3/week    Drug use: No   Sexual activity: Yes    Partners: Female    Comment: Wife  Other  Topics Concern   Not on file  Social History Narrative   Currently between jobs   Lives with Wife   1 Son (33) and 1 daughter (7)    2 grandchildren by daughter    1 dog and 1 cat    Enjoys gardening, movies, dining out.    Social Determinants of Health   Financial Resource Strain: Low Risk    Difficulty of Paying Living Expenses: Not hard at all  Food Insecurity: No Food Insecurity   Worried About Programme researcher, broadcasting/film/video in the Last Year: Never true   Ran Out of Food in the Last Year: Never true  Transportation Needs: No Transportation Needs   Lack of Transportation (Medical): No   Lack of Transportation (Non-Medical): No  Physical Activity: Not on file  Stress: No Stress Concern Present   Feeling of Stress : Not at all  Social Connections: Unknown   Frequency of Communication with Friends and Family: Not on file   Frequency of Social Gatherings with Friends and Family: Not on file   Attends Religious Services: Not on file   Active Member of Clubs or Organizations: Not on file   Attends Banker Meetings: Not on file   Marital Status: Married    Tobacco Counseling Counseling given: Not Answered  Clinical Intake: Pre-visit preparation completed: Yes        Diabetes: No  How often do you need to have someone help you when you read instructions, pamphlets, or other written materials from your doctor or pharmacy?: 1 - Never    Interpreter Needed?: No    Activities of Daily Living    02/22/2022   11:27 AM  In your present state of health, do you have any difficulty  performing the following activities:  Hearing? 0  Vision? 0  Walking or climbing stairs? 1  Comment Walker in use as needed. Paces self.  Preparing Food and eating ? N  Using the Toilet? N  In the past six months, have you accidently leaked urine? Y  Comment Managed with daily pad/brief.  Do you have problems with loss of bowel control? N  Managing your Medications? N  Housekeeping or managing your Housekeeping? N  Comment Paces self    Patient Care Team: Sherlene Shams, MD as PCP - General (Internal Medicine) Antonieta Iba, MD as Consulting Physician (Cardiology)  Indicate any recent Medical Services you may have received from other than [facility] providers in the past year (date may be approximate).     Assessment:   This is a routine wellness examination for Johnathan [name].  Virtual Visit via Telephone Note  I connected with  [name] on 02/22/22 at 11:15 AM EDT by telephone and verified that I am speaking with the correct person using two identifiers.  Persons participating in the virtual visit: patient/Nurse Health Advisor   I discussed the limitations of performing an evaluation and management service by telehealth. We continued and completed visit with audio only. Some vital signs may be absent or patient reported.   Hearing/Vision screen Hearing Screening - Comments:: Patient is able to hear conversational tones without difficulty.  No issues reported.  Dietary issues and exercise activities discussed: Current Exercise Habits: Home exercise routine, Type of exercise: calisthenics;walking (Water aerobics), Time (Minutes): 60, Frequency (Times/Week): 3, Weekly Exercise (Minutes/Week): 180, Intensity: Mild Regular diet   Goals Addressed             This Visit's Progress    Maintain  healthy lifestyle       Stay active       Depression Screen    02/22/2022   11:27 AM 12/15/2021    2:17 PM 09/16/2021    3:17 PM 06/17/2021    2:44 PM 03/11/2021    4:06 PM  01/05/2021   10:45 AM 09/29/2020   11:10 AM  PHQ 2/9 Scores  PHQ - 2 Score 0 PHQ- 9 Score  Fall Risk    02/22/2022   11:33 AM 01/18/2022    4:30 PM 12/15/2021    1:36 PM 09/16/2021    3:17 PM 06/17/2021    1:37 PM  Fall Risk   Falls in the past year? Number falls in past yr:  Injury with Fall?  0 0 0 0  Risk for fall due to : Impaired balance/gait History of fall(s) History of fall(s) History of fall(s) History of fall(s)  Follow up Falls evaluation completed Falls evaluation completed Falls evaluation completed Falls evaluation completed Falls evaluation completed   FALL RISK PREVENTION PERTAINING TO THE HOME: Home free of loose throw rugs in walkways, pet beds, electrical cords, etc? Yes  Adequate lighting in your home to reduce risk of falls? Yes   ASSISTIVE DEVICES UTILIZED TO PREVENT FALLS: Life alert? No  Use of a cane, walker or w/c? Yes  Grab bars in the bathroom? Yes  Shower chair or bench in shower? Yes   TIMED UP AND GO: Was the test performed? No .   Cognitive Function:  Patient is alert and oriented x3.     Immunizations Immunization History  Administered Date(s) Administered   Influenza Split 07/10/2012   Influenza,inj,Quad PF,6+ Mos 07/04/2013   Influenza-Unspecified 08/04/2016, 07/25/2017   PFIZER(Purple Top)SARS-COV-2 Vaccination 07/09/2020, 07/30/2020   PNEUMOCOCCAL CONJUGATE-20 01/05/2021   Tdap 07/10/2012, 03/10/2015   Shingrix Completed?: No.    Education has been provided regarding the importance of this vaccine. Patient has been advised to call insurance company to determine out of pocket expense if they have not yet received this vaccine. Advised may also receive vaccine at local pharmacy or Health Dept. Verbalized acceptance and understanding.  Screening Tests Health Maintenance  Topic Date Due   COVID-19 Vaccine (3 - Pfizer risk series) 03/10/2022 (Originally 08/27/2020)   Hepatitis C Screening   03/11/2022 (Originally 07/29/1974)   Zoster Vaccines- Shingrix (1 of 2) 05/25/2022 (Originally 07/29/1975)   INFLUENZA VACCINE  05/11/2022   COLONOSCOPY (Pts 45-45yrs Insurance coverage will need to be confirmed)  09/05/2022   TETANUS/TDAP  03/09/2025   Pneumonia Vaccine 11+ Years old  Completed   HPV VACCINES  Aged Out   Health Maintenance There are no preventive care reminders to display for this patient.  Lung Cancer Screening: (Low Dose CT Chest recommended if Age 89-80 years, 30 pack-year currently smoking OR have quit w/in 15years.) does not qualify.   Hepatitis C Screening: consent given to add with next lab panel. Deferred. Appointment note edited for next appointment follow up.   Vision Screening: Recommended annual ophthalmology exams for early detection of glaucoma and other disorders of the eye.  Dental Screening: Recommended annual dental exams for proper oral hygiene  Community Resource Referral / Chronic Care Management: CRR required this visit?  No   CCM required this visit?  No      Plan:   Keep all routine maintenance  appointments.   I have personally reviewed and noted the following in the patient's chart:   Medical and social history Use of alcohol, tobacco or illicit drugs  Current medications and supplements including opioid prescriptions. Patient is not currently taking opioid prescriptions. Functional ability and status Nutritional status Physical activity Advanced directives List of other physicians Hospitalizations, surgeries, and ER visits in previous 12 months Vitals Screenings to include cognitive, depression, and falls Referrals and appointments  In addition, I have reviewed and discussed with patient certain preventive protocols, quality metrics, and best practice recommendations. A written personalized care plan for preventive services as well as general preventive health recommendations were provided to patient.     OBrien-Blaney,  Tomica Arseneault L, LPN   1/61/0960     I have reviewed the above information and agree with above.   Duncan Dull, MD

## 2022-02-22 NOTE — Patient Instructions (Addendum)
[name] , Thank you for taking time to come for your Medicare Wellness Visit. I appreciate your ongoing commitment to your health goals. Please review the following plan we discussed and let me know if I can assist you in the future.   These are the goals we discussed:  Goals      Maintain healthy lifestyle     Stay active        This is a list of the screening recommended for you and due dates:  Health Maintenance  Topic Date Due   COVID-19 Vaccine (3 - Pfizer risk series) 03/10/2022*   Hepatitis C Screening: USPSTF Recommendation to screen - Ages 18-79 yo.  03/11/2022*   Zoster (Shingles) Vaccine (1 of 2) 05/25/2022*   Flu Shot  05/11/2022   Colon Cancer Screening  09/05/2022   Tetanus Vaccine  03/09/2025   Pneumonia Vaccine  Completed   HPV Vaccine  Aged Out  *Topic was postponed. The date shown is not the original due date.   Opioid Pain Medicine Management Opioids are powerful medicines that are used to treat moderate to severe pain. When used for short periods of time, they can help you to: Sleep better. Do better in physical or occupational therapy. Feel better in the first few days after an injury. Recover from surgery. Opioids should be taken with the supervision of a trained health care provider. They should be taken for the shortest period of time possible. This is because opioids can be addictive, and the longer you take opioids, the greater your risk of addiction. This addiction can also be called opioid use disorder. What are the risks? Using opioid pain medicines for longer than 3 days increases your risk of side effects. Side effects include: Constipation. Nausea and vomiting. Breathing difficulties (respiratory depression). Drowsiness. Confusion. Opioid use disorder. Itching. Taking opioid pain medicine for a long period of time can affect your ability to do daily tasks. It also puts you at risk for: Motor vehicle crashes. Depression. Suicide. Heart  attack. Overdose, which can be life-threatening. What is a pain treatment plan? A pain treatment plan is an agreement between you and your health care provider. Pain is unique to each person, and treatments vary depending on your condition. To manage your pain, you and your health care provider need to work together. To help you do this: Discuss the goals of your treatment, including how much pain you might expect to have and how you will manage the pain. Review the risks and benefits of taking opioid medicines. Remember that a good treatment plan uses more than one approach and minimizes the chance of side effects. Be honest about the amount of medicines you take and about any drug or alcohol use. Get pain medicine prescriptions from only one health care provider. Pain can be managed with many types of alternative treatments. Ask your health care provider to refer you to one or more specialists who can help you manage pain through: Physical or occupational therapy. Counseling (cognitive behavioral therapy). Good nutrition. Biofeedback. Massage. Meditation. Non-opioid medicine. Following a gentle exercise program. How to use opioid pain medicine Taking medicine Take your pain medicine exactly as told by your health care provider. Take it only when you need it. If your pain gets less severe, you may take less than your prescribed dose if your health care provider approves. If you are not having pain, do nottake pain medicine unless your health care provider tells you to take it. If your pain is  severe, do nottry to treat it yourself by taking more pills than instructed on your prescription. Contact your health care provider for help. Write down the times when you take your pain medicine. It is easy to become confused while on pain medicine. Writing the time can help you avoid overdose. Take other over-the-counter or prescription medicines only as told by your health care provider. Keeping  yourself and others safe  While you are taking opioid pain medicine: Do not drive, use machinery, or power tools. Do not sign legal documents. Do not drink alcohol. Do not take sleeping pills. Do not supervise children by yourself. Do not do activities that require climbing or being in high places. Do not go to a lake, river, ocean, spa, or swimming pool. Do not share your pain medicine with anyone. Keep pain medicine in a locked cabinet or in a secure area where pets and children cannot reach it. Stopping your use of opioids If you have been taking opioid medicine for more than a few weeks, you may need to slowly decrease (taper) how much you take until you stop completely. Tapering your use of opioids can decrease your risk of symptoms of withdrawal, such as: Pain and cramping in the abdomen. Nausea. Sweating. Sleepiness. Restlessness. Uncontrollable shaking (tremors). Cravings for the medicine. Do not attempt to taper your use of opioids on your own. Talk with your health care provider about how to do this. Your health care provider may prescribe a step-down schedule based on how much medicine you are taking and how long you have been taking it. Getting rid of leftover pills Do not save any leftover pills. Get rid of leftover pills safely by: Taking the medicine to a prescription take-back program. This is usually offered by the county or law enforcement. Bringing them to a pharmacy that has a drug disposal container. Flushing them down the toilet. Check the label or package insert of your medicine to see whether this is safe to do. Throwing them out in the trash. Check the label or package insert of your medicine to see whether this is safe to do. If it is safe to throw it out, remove the medicine from the original container, put it into a sealable bag or container, and mix it with used coffee grounds, food scraps, dirt, or cat litter before putting it in the trash. Follow these  instructions at home: Activity Do exercises as told by your health care provider. Avoid activities that make your pain worse. Return to your normal activities as told by your health care provider. Ask your health care provider what activities are safe for you. General instructions You may need to take these actions to prevent or treat constipation: Drink enough fluid to keep your urine pale yellow. Take over-the-counter or prescription medicines. Eat foods that are high in fiber, such as beans, whole grains, and fresh fruits and vegetables. Limit foods that are high in fat and processed sugars, such as fried or sweet foods. Keep all follow-up visits. This is important. Where to find support If you have been taking opioids for a long time, you may benefit from receiving support for quitting from a local support group or counselor. Ask your health care provider for a referral to these resources in your area. Where to find more information Centers for Disease Control and Prevention (CDC): FootballExhibition.com.br [place] Food and Drug Administration (FDA): PumpkinSearch.com.ee Get help right away if: You may have taken too much of an opioid (overdosed). Common symptoms of  an overdose: Your breathing is slower or more shallow than normal. You have a very slow heartbeat (pulse). You have slurred speech. You have nausea and vomiting. Your pupils become very small. You have other potential symptoms: You are very confused. You faint or feel like you will faint. You have cold, clammy skin. You have blue lips or fingernails. You have thoughts of harming yourself or harming others. These symptoms may represent a serious problem that is an emergency. Do not wait to see if the symptoms will go away. Get medical help right away. Call your local emergency services (911 in the [place] Do not drive yourself to the hospital.  If you ever feel like you may hurt yourself or others, or have thoughts about taking your own life, get  help right away. Go to your nearest emergency department or: Call your local emergency services (911 in the [place] Call the [facility] (7181994211 in the [place] Call a suicide crisis helpline, such as the National Suicide Prevention Lifeline at (478)824-8800 or 988 in the [place] This is open 24 hours a day in the [place] Text the Crisis Text Line at 534 753 6209 (in the [place] Summary Opioid medicines can help you manage moderate to severe pain for a short period of time. A pain treatment plan is an agreement between you and your health care provider. Discuss the goals of your treatment, including how much pain you might expect to have and how you will manage the pain. If you think that you or someone else may have taken too much of an opioid, get medical help right away. This information is not intended to replace advice given to you by your health care provider. Make sure you discuss any questions you have with your health care provider. Document Revised: 04/22/2021 Document Reviewed: 01/07/2021 Elsevier Patient Education  2023 [employer].

## 2022-02-23 ENCOUNTER — Other Ambulatory Visit: Payer: Self-pay | Admitting: Cardiovascular Disease

## 2022-02-23 DIAGNOSIS — I4819 Other persistent atrial fibrillation: Secondary | ICD-10-CM

## 2022-02-23 NOTE — Telephone Encounter (Signed)
Received request for warfarin refill: ? ?Last INR was 2.7 on 01/27/22 ?Next INR scheduled on 03/10/22 ?LOV was 03/04/21  Concha Se MD ?Next OV scheduled for 03/22/22 ? ?Refill approved. ?

## 2022-02-23 NOTE — Telephone Encounter (Signed)
Refill request

## 2022-03-01 NOTE — Progress Notes (Unsigned)
[name] [address] [place] [address] Phone: [phone] Subjective:   Johnathan [name], am serving as a scribe for Dr. Antoine [name].  This visit occurred during the SARS-CoV-2 public health emergency.  Safety protocols were in place, including screening questions prior to the visit, additional usage of staff PPE, and extensive cleaning of exam room while observing appropriate contact time as indicated for disinfecting solutions.    I'm seeing this patient by the request  of:  Johnathan Shams, MD  CC: Neck pain follow-up  [id number]  Johnathan [name] is a 66 y.o. male coming in with complaint of neck pain and arm numbness. Patient last seen in September 2022 for lumbar radiculopathy and neck pain in 2015. Patient states that when he lies down he has L arm numbness and during the day numbness is more intermittent. Patient relieves symptoms with cervical flexion.   Patient went on Ozempic in December and lost 50#. states that he has fallen 2x since December. Confirmed by partner in office.  Patient states that unfortunately he feels like some of it was also muscle loss.  Has not been quite as active as he would like to be.  Known arthritic changes      Past Medical History:  Diagnosis Date   Arthritis    a. chronic joint pain   B12 deficiency    Chronic systolic CHF (congestive heart failure) (HCC)    a. echo 2013: EF of 50-55%, normal right ventricular systolic pressure, normal left atrium; b. EF 40-45%, inadequate for LV wall motion, mild MR, LA severely dilated @ 52 mm, PASP nl   Erythema migrans (Lyme disease) 04/08/2015   History of DVT (deep vein thrombosis) 2004   s/p bariatric surgery   History of stress test    a. there was no ST segment deviation noted during stress,    There is a small defect of moderate severity present in the apex location, suggestive of apical ischemia, intermediate risk, calculated EF 21% but visually  appears to be 35-40%   Hypertension    Hypothyroidism    Neuropathy    Obesity    OSA (obstructive sleep apnea)    a. not compliant with CPAP   PAF (paroxysmal atrial fibrillation) (HCC)    a. CHADSVASc at least 2 (HTN and vascular disease); b. on Eliquis    Past Surgical History:  Procedure Laterality Date   ARTHROSCOPIC REPAIR ACL Bilateral 1993   bilateral knee x2 on left and x2 on right   BARIATRIC SURGERY  2004   [place]    BARIATRIC SURGERY     CHOLECYSTECTOMY  1982   ELECTROPHYSIOLOGIC STUDY N/A 06/20/2015   Procedure: CARDIOVERSION;  Surgeon: Antonieta Iba, MD;  Location: [facility] ORS;  Service: Cardiovascular;  Laterality: N/A;   KNEE ARTHROPLASTY Right 06/20/2017   Procedure: COMPUTER ASSISTED TOTAL KNEE ARTHROPLASTY;  Surgeon: Donato Heinz, MD;  Location: [facility] OR[facility];  Service: Orthopedics;  Laterality: Right;   MENISCUS DEBRIDEMENT Left 2011   Left knee   REPLACEMENT TOTAL KNEE Left 07/2013   TONSILLECTOMY     Social History   Socioeconomic History   Marital status: Married    Spouse name: Not on file   Number of children: Not on file   Years of education: Not on file   Highest education level: Not on file  Occupational History   Not on file  Tobacco Use   Smoking status: Never   Smokeless  tobacco: Never  Vaping Use   Vaping Use: Never used  Substance and Sexual Activity   Alcohol use: Yes    Alcohol/week: 2.0 standard drinks    Types: 2 Shots of liquor per week    Comment: Vodka and lemonade occasionally 2-3/week    Drug use: No   Sexual activity: Yes    Partners: Female    Comment: Wife  Other Topics Concern   Not on file  Social History Narrative   Currently between jobs   Lives with Wife   1 Son (33) and 1 daughter (51)    2 grandchildren by daughter    1 dog and 1 cat    Enjoys gardening, movies, dining out.    Social Determinants of Health   Financial Resource Strain: Low Risk    Difficulty of Paying Living Expenses: Not hard at all   Food Insecurity: No Food Insecurity   Worried About Programme researcher, broadcasting/film/video in the Last Year: Never true   Ran Out of Food in the Last Year: Never true  Transportation Needs: No Transportation Needs   Lack of Transportation (Medical): No   Lack of Transportation (Non-Medical): No  Physical Activity: Not on file  Stress: No Stress Concern Present   Feeling of Stress : Not at all  Social Connections: Unknown   Frequency of Communication with Friends and Family: Not on file   Frequency of Social Gatherings with Friends and Family: Not on file   Attends Religious Services: Not on file   Active Member of Clubs or Organizations: Not on file   Attends Banker Meetings: Not on file   Marital Status: Married   Allergies  Allergen Reactions   Eliquis [Apixaban] Itching    "Red spots" and severe itching   Xarelto [Rivaroxaban] Other (See Comments)    Reaction in 2013   Family History  Problem Relation Age of Onset   Dementia Mother    Diabetes Father    Alzheimer's disease Father     Current Outpatient Medications (Endocrine & Metabolic):    levothyroxine (SYNTHROID) 25 MCG tablet, Take 1 tablet (25 mcg total) by mouth daily before breakfast.  Current Outpatient Medications (Cardiovascular):    amiodarone (PACERONE) 200 MG tablet, Take 1 tablet (200 mg total) by mouth 2 (two) times daily.   furosemide (LASIX) 40 MG tablet, Take 1 tablet (40 mg total) by mouth daily as needed.   hydrochlorothiazide (HYDRODIURIL) 25 MG tablet, Take 1 tablet (25 mg total) by mouth daily.   losartan (COZAAR) 100 MG tablet, Take 1 tablet (100 mg total) by mouth daily.   metoprolol succinate (TOPROL-XL) 25 MG 24 hr tablet, TAKE 1 TABLET BY MOUTH DAILY. PLEASE SCHEDULE OFFICE VISIT FOR FURTHER REFILLS. THANK YOU!   nitroGLYCERIN (NITROSTAT) 0.4 MG SL tablet, Place 1 tablet (0.4 mg total) under the tongue every 5 (five) minutes as needed for chest pain.   propranolol (INDERAL) 20 MG tablet, Take 1  tablet (20 mg total) by mouth 3 (three) times daily as needed. Reported on 03/22/2016   rosuvastatin (CRESTOR) 10 MG tablet, Take 1 tablet (10 mg total) by mouth daily at 2 PM.   Current Outpatient Medications (Analgesics):    HYDROcodone-acetaminophen (NORCO) 10-325 MG tablet, Take 1 tablet by mouth every 8 (eight) hours as needed.   HYDROcodone-acetaminophen (NORCO) 10-325 MG tablet, Take 1 tablet by mouth every 8 (eight) hours as needed. PLEASE FILL NOW.  REMAINDER OF 30 DAY SUPPLY   HYDROcodone-acetaminophen (NORCO) 10-325 MG  tablet, Take 1 tablet by mouth every 8 (eight) hours as needed.  Current Outpatient Medications (Hematological):    Ferrous Gluconate 324 (37.5 Fe) MG TABS, Take 1 tablet by mouth daily.    vitamin B-12 (CYANOCOBALAMIN) 500 MCG tablet, Take 500 mcg by mouth daily.   warfarin (COUMADIN) 5 MG tablet, TAKE 1 OR 2 TABLETS BY MOUTH EVERY DAY AS DIRECTED BY ANTI-COAG CLINIC  Current Outpatient Medications (Other):    B Complex Vitamins (B COMPLEX 50 PO), Take 1 tablet by mouth daily.    buPROPion (WELLBUTRIN XL) 300 MG 24 hr tablet, Take 1 tablet (300 mg total) by mouth daily.   Cholecalciferol (VITAMIN D3) 2000 UNITS TABS, Take 1 tablet by mouth daily.    docusate sodium (COLACE) 100 MG capsule, Take 400 mg by mouth daily.    gabapentin (NEURONTIN) 300 MG capsule, Take 1 capsule (300 mg total) by mouth 3 (three) times daily.   GLUCOSAMINE HCL-MSM PO, Take 1 tablet by mouth daily.    Multiple Vitamin (MULTIVITAMIN) tablet, Take 1 tablet by mouth daily.   Potassium Chloride ER 20 MEQ TBCR, Take 20 mEq by mouth 2 (two) times daily as needed.   Semaglutide-Weight Management 1 MG/0.5ML SOAJ, Inject 1 mg into the skin once a week.   venlafaxine XR (EFFEXOR-XR) 150 MG 24 hr capsule, Take 1 capsule (150 mg total) by mouth daily with breakfast.   vitamin E 400 UNIT capsule, Take 400 Units by mouth daily.   Reviewed prior external information including notes and imaging from   primary care provider As well as notes that were available from care everywhere and other healthcare systems.  Past medical history, social, surgical and family history all reviewed in electronic medical record.  No pertanent information unless stated regarding to the chief complaint.   Review of Systems:  No headache, visual changes, nausea, vomiting, diarrhea, constipation, dizziness, abdominal pain, skin rash, fevers, chills, night sweats, weight loss, swollen lymph nodes, , joint swelling, chest pain, shortness of breath, mood changes. POSITIVE muscle aches, body aches  Objective  Blood pressure 132/84, pulse (!) 58, height 6\' 3"  (1.905 m), weight (!) 351 lb (159.2 kg), SpO2 97 %.   General: No apparent distress alert and oriented x3 mood and affect normal, dressed appropriately.  Overweight HEENT: Pupils equal, extraocular movements intact  Respiratory: Patient's speak in full sentences and does not appear short of breath  Cardiovascular: No lower extremity edema, non tender, no erythema  Gait severely antalgic MSK: Neck exam does have loss of lordosis.  Positive Spurling's with radicular symptoms in the C7 and C8 distribution on the left side.  Patient does have weakness in grip strength as well as resisted extension of the middle finger on the left side.  Likely deep tendon reflexes do seem to be intact.    Impression and Recommendations:     The above documentation has been reviewed and is accurate and complete Judi SaaZachary M Tiani Stanbery, DO

## 2022-03-02 ENCOUNTER — Ambulatory Visit: Payer: PPO | Admitting: Family Medicine

## 2022-03-02 VITALS — BP 132/84 | HR 58 | Ht 75.0 in | Wt 351.0 lb

## 2022-03-02 DIAGNOSIS — M542 Cervicalgia: Secondary | ICD-10-CM

## 2022-03-02 NOTE — Patient Instructions (Addendum)
[place] [phone] Call Today  When we receive your results we will contact you.  Depending may need to consider epidural  See you again in 6-8 weeks

## 2022-03-02 NOTE — Assessment & Plan Note (Signed)
Known arthritic changes noted.  Discussed which activities to do which ones to avoid.  Patient is having more radicular symptoms and does have weakness noted in the C7 and C8 distribution with a positive Spurling's at this time.  Failed conservative therapy and I do feel with the weakness noted advanced imaging is warranted.  Patient could be a potential candidate for an epidural.  Patient has many other medical problems which makes different medications very difficult as well.

## 2022-03-05 ENCOUNTER — Ambulatory Visit: Payer: PPO | Admitting: Cardiovascular Disease

## 2022-03-10 ENCOUNTER — Ambulatory Visit (INDEPENDENT_AMBULATORY_CARE_PROVIDER_SITE_OTHER): Payer: PPO

## 2022-03-10 DIAGNOSIS — Z86718 Personal history of other venous thrombosis and embolism: Secondary | ICD-10-CM | POA: Diagnosis not present

## 2022-03-10 DIAGNOSIS — Z5181 Encounter for therapeutic drug level monitoring: Secondary | ICD-10-CM

## 2022-03-10 DIAGNOSIS — I4819 Other persistent atrial fibrillation: Secondary | ICD-10-CM | POA: Diagnosis not present

## 2022-03-10 LAB — POCT INR: INR: 1.8 — AB (ref 2.0–3.0)

## 2022-03-10 NOTE — Patient Instructions (Signed)
-   TAKE 2 TABLETS TODAY ONLY and then continue dosage of warfarin 1 tablet every day EXCEPT 1.5 tablets on MONDAYS, Crellin.  - Recheck INR in 6 weeks TRY TO GET YOUR GREENS ON A SCHEDULE.  PICK A DAY (OR DAYS) AND HAVE THEM ON THE SAME DAY EACH WEEK

## 2022-03-15 ENCOUNTER — Ambulatory Visit
Admission: RE | Admit: 2022-03-15 | Discharge: 2022-03-15 | Disposition: A | Discharge status: Home / Self Care | Payer: PPO | Source: Ambulatory Visit | Attending: Family Medicine | Admitting: Family Medicine

## 2022-03-15 DIAGNOSIS — M542 Cervicalgia: Secondary | ICD-10-CM

## 2022-03-16 ENCOUNTER — Other Ambulatory Visit: Payer: Self-pay | Admitting: Cardiovascular Disease

## 2022-03-16 ENCOUNTER — Other Ambulatory Visit: Payer: Self-pay | Admitting: Family

## 2022-03-16 ENCOUNTER — Other Ambulatory Visit: Payer: Self-pay

## 2022-03-16 ENCOUNTER — Other Ambulatory Visit: Payer: Self-pay | Admitting: Family Medicine

## 2022-03-16 MED ORDER — GABAPENTIN 300 MG PO CAPS: 300.0000 mg | ORAL_CAPSULE | Freq: Three times a day (TID) | ORAL | 0 refills | Status: DC

## 2022-03-18 ENCOUNTER — Encounter: Payer: Self-pay | Admitting: Internal Medicine

## 2022-03-19 ENCOUNTER — Ambulatory Visit (INDEPENDENT_AMBULATORY_CARE_PROVIDER_SITE_OTHER): Payer: PPO | Admitting: Internal Medicine

## 2022-03-19 VITALS — BP 128/78 | HR 67 | Temp 98.1°F | Ht 75.0 in | Wt 352.6 lb

## 2022-03-19 DIAGNOSIS — I10 Essential (primary) hypertension: Secondary | ICD-10-CM | POA: Diagnosis not present

## 2022-03-19 DIAGNOSIS — R7303 Prediabetes: Secondary | ICD-10-CM

## 2022-03-19 DIAGNOSIS — E7849 Other hyperlipidemia: Secondary | ICD-10-CM | POA: Diagnosis not present

## 2022-03-19 DIAGNOSIS — E039 Hypothyroidism, unspecified: Secondary | ICD-10-CM

## 2022-03-19 DIAGNOSIS — R296 Repeated falls: Secondary | ICD-10-CM

## 2022-03-19 DIAGNOSIS — I4819 Other persistent atrial fibrillation: Secondary | ICD-10-CM

## 2022-03-19 DIAGNOSIS — N529 Male erectile dysfunction, unspecified: Secondary | ICD-10-CM

## 2022-03-19 DIAGNOSIS — N4601 Organic azoospermia: Secondary | ICD-10-CM

## 2022-03-19 DIAGNOSIS — M4692 Unspecified inflammatory spondylopathy, cervical region: Secondary | ICD-10-CM

## 2022-03-19 DIAGNOSIS — M5412 Radiculopathy, cervical region: Secondary | ICD-10-CM

## 2022-03-19 DIAGNOSIS — M5010 Cervical disc disorder with radiculopathy, unspecified cervical region: Secondary | ICD-10-CM

## 2022-03-19 DIAGNOSIS — I422 Other hypertrophic cardiomyopathy: Secondary | ICD-10-CM

## 2022-03-19 MED ORDER — HYDROCODONE-ACETAMINOPHEN 10-325 MG PO TABS: 1.0000 | ORAL_TABLET | Freq: Three times a day (TID) | ORAL | 0 refills | Status: DC | PRN

## 2022-03-19 MED ORDER — SILDENAFIL CITRATE 20 MG PO TABS: 20.0000 mg | ORAL_TABLET | Freq: Three times a day (TID) | ORAL | 1 refills | Status: DC

## 2022-03-19 NOTE — Progress Notes (Unsigned)
Subjective:  Patient ID: Johnathan [name], male    DOB: 1956/10/03  Age: 66 y.o. MRN: [id number]  CC: The primary encounter diagnosis was Essential hypertension. Diagnoses of Acquired hypothyroidism, Other hyperlipidemia, and Prediabetes were also pertinent to this visit.   HPI Johnathan Dawesdward A Cranston presents for  Chief Complaint  Patient presents with   Follow-up    3 month follow up    1) chronic low back and knee pai n.  Managed with hydrocodone 10/325  taken tid . Last reill May 8   2) Morbid obesity:  was taking Ozempic   until script ran out. Has been without it for 4 weeks . Insurance would not pay for wegovy   3) has been seeing Dr Katrinka BlazingSmith for management of cervical radciulitis and symptoms are worsening .  MRI was ordered of cervical spine,  patient requested I review with him during today's visit. MRI reviewed. .  Neurology referral made  4) frequent falls:  denies worsening balance; has been working in the yard.  Falls in slow motion,   feels that the 2 recent falls were caused by the dog .  Insists that his arma and Legs are  getting stronger .  He is aware of the risk of a head injury on coumadin   Outpatient Medications Prior to Visit  Medication Sig Dispense Refill   amiodarone (PACERONE) 200 MG tablet Take 1 tablet (200 mg total) by mouth 2 (two) times daily. 180 tablet 3   B Complex Vitamins (B COMPLEX 50 PO) Take 1 tablet by mouth daily.      buPROPion (WELLBUTRIN XL) 300 MG 24 hr tablet Take 1 tablet (300 mg total) by mouth daily. 90 tablet 3   Cholecalciferol (VITAMIN D3) 2000 UNITS TABS Take 1 tablet by mouth daily.      docusate sodium (COLACE) 100 MG capsule Take 400 mg by mouth daily.      Ferrous Gluconate 324 (37.5 Fe) MG TABS Take 1 tablet by mouth daily.      furosemide (LASIX) 40 MG tablet Take 1 tablet (40 mg total) by mouth daily as needed. 90 tablet 3   gabapentin (NEURONTIN) 300 MG capsule Take 1 capsule (300 mg total) by mouth 3 (three) times daily. 270  capsule 0   GLUCOSAMINE HCL-MSM PO Take 1 tablet by mouth daily.      hydrochlorothiazide (HYDRODIURIL) 25 MG tablet Take 1 tablet (25 mg total) by mouth daily. 90 tablet 3   HYDROcodone-acetaminophen (NORCO) 10-325 MG tablet Take 1 tablet by mouth every 8 (eight) hours as needed. 90 tablet 0   HYDROcodone-acetaminophen (NORCO) 10-325 MG tablet Take 1 tablet by mouth every 8 (eight) hours as needed. PLEASE FILL NOW.  REMAINDER OF 30 DAY SUPPLY 69 tablet 0   HYDROcodone-acetaminophen (NORCO) 10-325 MG tablet Take 1 tablet by mouth every 8 (eight) hours as needed. 90 tablet 0   levothyroxine (SYNTHROID) 25 MCG tablet Take 1 tablet (25 mcg total) by mouth daily before breakfast. 90 tablet 3   losartan (COZAAR) 100 MG tablet Take 1 tablet (100 mg total) by mouth daily. 90 tablet 1   metoprolol succinate (TOPROL-XL) 25 MG 24 hr tablet TAKE 1 TABLET BY MOUTH DAILY. PLEASE SCHEDULE OFFICE VISIT FOR FURTHER REFILLS. THANK YOU! 90 tablet 0   Multiple Vitamin (MULTIVITAMIN) tablet Take 1 tablet by mouth daily.     nitroGLYCERIN (NITROSTAT) 0.4 MG SL tablet Place 1 tablet (0.4 mg total) under the tongue every 5 (five) minutes as  needed for chest pain. 30 tablet 3   Potassium Chloride ER 20 MEQ TBCR Take 20 mEq by mouth 2 (two) times daily as needed. 180 tablet 3   propranolol (INDERAL) 20 MG tablet Take 1 tablet (20 mg total) by mouth 3 (three) times daily as needed. Reported on 03/22/2016 270 tablet 3   rosuvastatin (CRESTOR) 10 MG tablet TAKE 1 TABLET (10 MG TOTAL) BY MOUTH DAILY AT 2 PM. 90 tablet 3   venlafaxine XR (EFFEXOR-XR) 150 MG 24 hr capsule Take 1 capsule (150 mg total) by mouth daily with breakfast. 90 capsule 0   vitamin B-12 (CYANOCOBALAMIN) 500 MCG tablet Take 500 mcg by mouth daily.     vitamin E 400 UNIT capsule Take 400 Units by mouth daily.     warfarin (COUMADIN) 5 MG tablet TAKE 1 OR 2 TABLETS BY MOUTH EVERY DAY AS DIRECTED BY ANTI-COAG CLINIC 45 tablet 3   Semaglutide-Weight Management 1  MG/0.5ML SOAJ Inject 1 mg into the skin once a week. (Patient not taking: Reported on 03/18/2022) 2 mL 2   No facility-administered medications prior to visit.    Review of Systems;  Patient denies headache, fevers, malaise, unintentional weight loss, skin rash, eye pain, sinus congestion and sinus pain, sore throat, dysphagia,  hemoptysis , cough, dyspnea, wheezing, chest pain, palpitations, orthopnea, edema, abdominal pain, nausea, melena, diarrhea, constipation, flank pain, dysuria, hematuria, urinary  Frequency, nocturia, numbness, tingling, seizures,  Focal weakness, Loss of consciousness,  Tremor, insomnia, depression, anxiety, and suicidal ideation.      Objective:  BP 128/78 (BP Location: Left Arm, Patient Position: Sitting, Cuff Size: Large)   Pulse 67   Temp 98.1 F (36.7 C) (Oral)   Ht 6\' 3"  (1.905 m)   Wt (!) 352 lb 9.6 oz (159.9 kg)   SpO2 95%   BMI 44.07 kg/m   BP Readings from Last 3 Encounters:  03/19/22 128/78  03/02/22 132/84  12/15/21 130/66    Wt Readings from Last 3 Encounters:  03/19/22 (!) 352 lb 9.6 oz (159.9 kg)  03/02/22 (!) 351 lb (159.2 kg)  02/22/22 (!) 344 lb (156 kg)    General appearance: alert, cooperative and appears stated age Ears: normal TM's and external ear canals both ears Throat: lips, mucosa, and tongue normal; teeth and gums normal Neck: no adenopathy, no carotid bruit, supple, symmetrical, trachea midline and thyroid not enlarged, symmetric, no tenderness/mass/nodules Back: symmetric, no curvature. ROM normal. No CVA tenderness. Lungs: clear to auscultation bilaterally Heart: regular rate and rhythm, S1, S2 normal, no murmur, click, rub or gallop Abdomen: soft, non-tender; bowel sounds normal; no masses,  no organomegaly Pulses: 2+ and symmetric Skin: Skin color, texture, turgor normal. No rashes or lesions Lymph nodes: Cervical, supraclavicular, and axillary nodes normal.  Lab Results  Component Value Date   HGBA1C 5.2  12/15/2021   HGBA1C 5.9 03/11/2021   HGBA1C 5.6 05/29/2020    Lab Results  Component Value Date   CREATININE 1.03 10/16/2021   CREATININE 1.13 03/11/2021   CREATININE 1.07 09/29/2020    Lab Results  Component Value Date   WBC 7.3 09/29/2020   HGB 15.2 09/29/2020   HCT 44.9 09/29/2020   PLT 160 09/29/2020   GLUCOSE 103 (H) 10/16/2021   CHOL 120 10/16/2021   TRIG 82.0 10/16/2021   HDL 45.50 10/16/2021   LDLCALC 58 10/16/2021   ALT 27 10/16/2021   AST 24 10/16/2021   NA 141 10/16/2021   K 4.3 10/16/2021   CL 104  10/16/2021   CREATININE 1.03 10/16/2021   BUN 15 10/16/2021   CO2 27 10/16/2021   TSH 3.27 10/16/2021   PSA 0.68 10/16/2021   INR 1.8 (A) 03/10/2022   HGBA1C 5.2 12/15/2021    MR CERVICAL SPINE WO CONTRAST  Result Date: 03/16/2022 CLINICAL DATA:  Neck and shoulder pain and left arm pain for 3 years. EXAM: MRI CERVICAL SPINE WITHOUT CONTRAST TECHNIQUE: Multiplanar, multisequence MR imaging of the cervical spine was performed. No intravenous contrast was administered. COMPARISON:  Cervical spine radiograph 04/03/2021 FINDINGS: Alignment: Normal alignment of the cervical vertebral bodies. Mild straightening of the normal cervical curvature. Vertebrae: No acute bony findings or worrisome bone lesions. Mild endplate reactive changes mainly in the lower cervical spine. Cord: Normal cord signal intensity.  No cord lesions or syrinx. Posterior Fossa, vertebral arteries, paraspinal tissues: No significant findings. Degenerative changes are noted at C1-2 with mild pannus formation but no significant mass effect on the upper cervical cord. Disc levels: C2-3: No significant findings. C3-4: No significant findings. C4-5: Moderate to large right-sided disc extrusion. Significant mass effect on the right side of the thecal sac contacting and slightly deforming the right-side of the cervical cord. No foraminal stenosis. C5-6: Degenerative disc disease with a bulging annulus, osteophytic  ridging and uncinate spurring. There is also a broad-based disc protrusion asymmetric right with asymmetric mass effect on the right side of the thecal sac with near obliteration of anterior CSF space and very slight flattening of the anterior cord. Mild medial foraminal encroachment also noted bilaterally. C6-7: Degenerative disc disease with a bulging degenerated annulus and osteophytic ridging. There is also a broad-based central disc protrusion with flattening of the ventral thecal sac and near obliteration of the ventral CSF space. Mild foraminal narrowing bilaterally. C7-T1: Bulging degenerated annulus along with osteophytic ridging and uncinate spurring. There is mild mass effect on both sides of the thecal sac, left greater than right and mild bilateral foraminal stenosis. T1-2: Diffuse bulging disc. IMPRESSION: 1. Degenerative cervical spondylosis with multilevel disc disease and facet disease. 2. Moderate to large right-sided disc extrusion at C4-5 with mass effect on the right side of the cervical cord. 3. Broad-based disc protrusion at C5-6 asymmetric right with asymmetric mass effect on the right side of the thecal sac. There is also mild medial foraminal encroachment bilaterally. 4. Broad-based central disc protrusion at C6-7 with flattening of the ventral thecal sac and near obliteration of the ventral CSF space. Mild foraminal narrowing bilaterally also noted. 5. Diffuse bulging annulus at C7-T1 with mild mass effect on both sides of the thecal sac, left greater than right. Mild bilateral foraminal stenosis. Electronically Signed   By: Rudie Meyer M.D.   On: 03/16/2022 15:53    Assessment & Plan:   Problem List Items Addressed This Visit     Acquired hypothyroidism   Essential hypertension - Primary   Hyperlipidemia   Prediabetes    I spent a total of   minutes with this patient in a face to face visit on the date of this encounter reviewing the last office visit with me on        ,   most recent with patient's cardiologist in    ,  patient'ss diet and eating habits, home blood pressure readings ,  most recent imaging study ,   and post visit ordering of testing and therapeutics.    Follow-up: No follow-ups on file.   Sherlene Shams, MD

## 2022-03-20 LAB — COMPREHENSIVE METABOLIC PANEL
AG Ratio: 1.6 (calc) (ref 1.0–2.5)
ALT: 36 U/L (ref 9–46)
AST: 23 U/L (ref 10–35)
Albumin: 4.2 g/dL (ref 3.6–5.1)
Alkaline phosphatase (APISO): 101 U/L (ref 35–144)
BUN: 23 mg/dL (ref 7–25)
CO2: 24 mmol/L (ref 20–32)
Calcium: 8.7 mg/dL (ref 8.6–10.3)
Chloride: 106 mmol/L (ref 98–110)
Creat: 0.95 mg/dL (ref 0.70–1.35)
Globulin: 2.7 g/dL (calc) (ref 1.9–3.7)
Glucose, Bld: 83 mg/dL (ref 65–99)
Potassium: 4.1 mmol/L (ref 3.5–5.3)
Sodium: 140 mmol/L (ref 135–146)
Total Bilirubin: 0.5 mg/dL (ref 0.2–1.2)
Total Protein: 6.9 g/dL (ref 6.1–8.1)

## 2022-03-20 LAB — CBC WITH DIFFERENTIAL/PLATELET
Absolute Monocytes: 787 cells/uL (ref 200–950)
Basophils Absolute: 48 cells/uL (ref 0–200)
Basophils Relative: 0.5 %
Eosinophils Absolute: 154 cells/uL (ref 15–500)
Eosinophils Relative: 1.6 %
HCT: 42.9 % (ref 38.5–50.0)
Hemoglobin: 14.3 g/dL (ref 13.2–17.1)
Lymphs Abs: 2266 cells/uL (ref 850–3900)
MCH: 30.2 pg (ref 27.0–33.0)
MCHC: 33.3 g/dL (ref 32.0–36.0)
MCV: 90.7 fL (ref 80.0–100.0)
MPV: 11.3 fL (ref 7.5–12.5)
Monocytes Relative: 8.2 %
Neutro Abs: 6346 cells/uL (ref 1500–7800)
Neutrophils Relative %: 66.1 %
Platelets: 178 10*3/uL (ref 140–400)
RBC: 4.73 10*6/uL (ref 4.20–5.80)
RDW: 13.2 % (ref 11.0–15.0)
Total Lymphocyte: 23.6 %
WBC: 9.6 10*3/uL (ref 3.8–10.8)

## 2022-03-20 LAB — HEMOGLOBIN A1C
Hgb A1c MFr Bld: 5.4 % of total Hgb (ref <5.7)
Mean Plasma Glucose: 108 mg/dL
eAG (mmol/L): 6 mmol/L

## 2022-03-20 LAB — LIPID PANEL
Cholesterol: 118 mg/dL (ref <200)
HDL: 48 mg/dL (ref >=40)
LDL Cholesterol (Calc): 48 mg/dL (calc)
Non-HDL Cholesterol (Calc): 70 mg/dL (calc) (ref <130)
Total CHOL/HDL Ratio: 2.5 (calc) (ref <5.0)
Triglycerides: 140 mg/dL (ref <150)

## 2022-03-20 LAB — TSH: TSH: 2.01 mIU/L (ref 0.40–4.50)

## 2022-03-21 DIAGNOSIS — N529 Male erectile dysfunction, unspecified: Secondary | ICD-10-CM | POA: Insufficient documentation

## 2022-03-21 DIAGNOSIS — M4692 Unspecified inflammatory spondylopathy, cervical region: Secondary | ICD-10-CM | POA: Insufficient documentation

## 2022-03-21 DIAGNOSIS — N4601 Organic azoospermia: Secondary | ICD-10-CM | POA: Insufficient documentation

## 2022-03-21 NOTE — Assessment & Plan Note (Addendum)
Diastolic dysfunction with LVH and left atrial dilation , mildly reduced systolic function by 2016 ECHO   Complicated by untreated OSA

## 2022-03-21 NOTE — Assessment & Plan Note (Signed)
Rate controlled with metoprolol ; embolic stroke risk managed with warfarin

## 2022-03-21 NOTE — Progress Notes (Unsigned)
Cardiology Office Note  Date:  03/22/2022   ID:  COREY [name], DOB 1956/02/02, MRN [id number]  PCP:  Crecencio Mc, MD   Chief Complaint  Patient presents with   12 month follow up     Patient c/o bilateral LE edema at times. Medications reviewed by the patient verbally.     HPI:  [name] is a pleasant 66 year old gentleman with  Morbid obesity chronic joint pain of the shoulders and knees on long-term low-dose narcotics,  hypertension,   bariatric surgery,  atrial fibrillation in August 2013,   normal ejection fraction at that time  sleep apnea, currently not on his CPAP who presents for follow-up of his atrial fibrillation.   Last seen by myself in clinic May 2022 In general reports he is doing well Has tried ozempic, lost weight Was on 1 mg, difficulty obtaining the medication secondary to production issues Down 20 pounds today compared to one year ago  Uses a cane to ambulate On vicodin for chronic pain "Bad neuopathy", lots of falls, "toes are catching"  off pradaxa, had trouble getting this from the company,  on warfarin now with no complaints  Has ED, wants to start viagra, prescription sent in  EKG personally reviewed by myself on todays visit NSR rate 63 bpm no St or Twave changes   total knee replacement b/l 2019  No regular exercise program  EKG personally reviewed by myself on todays visit Shows normal sinus rhythm rate 63 bpm no significant ST-T wave changes  Other past medical history reviewed Echo 2016  - Left ventricle: The cavity size was normal. There was mild   concentric hypertrophy. Systolic function was mildly to   moderately reduced. The estimated ejection fraction was in the   range of 40% to 45%. Images were inadequate for LV wall motion   assessment. - Mitral valve: There was mild regurgitation. - Left atrium: The atrium was severely dilated. - Pulmonary arteries: Systolic pressure was within the normal   range.   previous  cardioversion,successful in restoring normal sinus rhythm    tick bite in June 2016, he developed atrial fibrillation initially with RVR. Started on digoxin, continued on metoprolol and diltiazem with improved heart rate. Echocardiogram and stress test at that time Cardiac exam shows slightly depressed ejection fraction, dilated left atrium, mild LVH. Ejection fraction estimated at 40-45%. Stress test with small region of apical perfusion defect, depressed ejection fraction.    PMH:   has a past medical history of Arthritis, B12 deficiency, Chronic systolic CHF (congestive heart failure) ([place]), Erythema migrans (Lyme disease) (04/08/2015), History of DVT (deep vein thrombosis) (2004), History of stress test, Hypertension, Hypothyroidism, Neuropathy, Obesity, OSA (obstructive sleep apnea), and PAF (paroxysmal atrial fibrillation) ([place]).  PSH:    Past Surgical History:  Procedure Laterality Date   ARTHROSCOPIC REPAIR ACL Bilateral 1993   bilateral knee x2 on left and x2 on right   BARIATRIC SURGERY  2004   Rou [place] Y    [place] N/A 06/20/2015   Procedure: CARDIOVERSION;  Surgeon: Minna Merritts, MD;  Location: [facility] ORS;  Service: Cardiovascular;  Laterality: N/A;   KNEE ARTHROPLASTY Right 06/20/2017   Procedure: COMPUTER ASSISTED TOTAL KNEE ARTHROPLASTY;  Surgeon: Dereck Leep, MD;  Location: [facility] ORS;  Service: Orthopedics;  Laterality: Right;   MENISCUS DEBRIDEMENT Left 2011   Left knee   REPLACEMENT TOTAL KNEE Left 07/2013   TONSILLECTOMY  Current Outpatient Medications on File Prior to Visit  Medication Sig Dispense Refill   amiodarone (PACERONE) 200 MG tablet Take 1 tablet (200 mg total) by mouth 2 (two) times daily. 180 tablet 3   B Complex Vitamins (B COMPLEX 50 PO) Take 1 tablet by mouth daily.      buPROPion (WELLBUTRIN XL) 300 MG 24 hr tablet Take 1 tablet (300 mg total) by mouth daily. 90 tablet 3    Cholecalciferol (VITAMIN D3) 2000 UNITS TABS Take 1 tablet by mouth daily.      docusate sodium (COLACE) 100 MG capsule Take 400 mg by mouth daily.      Ferrous Gluconate 324 (37.5 Fe) MG TABS Take 1 tablet by mouth daily.      furosemide (LASIX) 40 MG tablet Take 1 tablet (40 mg total) by mouth daily as needed. 90 tablet 3   gabapentin (NEURONTIN) 300 MG capsule Take 1 capsule (300 mg total) by mouth 3 (three) times daily. 270 capsule 0   GLUCOSAMINE HCL-MSM PO Take 1 tablet by mouth daily.      hydrochlorothiazide (HYDRODIURIL) 25 MG tablet Take 1 tablet (25 mg total) by mouth daily. 90 tablet 3   HYDROcodone-acetaminophen (NORCO) 10-325 MG tablet Take 1 tablet by mouth every 8 (eight) hours as needed. 90 tablet 0   levothyroxine (SYNTHROID) 25 MCG tablet Take 1 tablet (25 mcg total) by mouth daily before breakfast. 90 tablet 3   losartan (COZAAR) 100 MG tablet Take 1 tablet (100 mg total) by mouth daily. 90 tablet 1   metoprolol succinate (TOPROL-XL) 25 MG 24 hr tablet TAKE 1 TABLET BY MOUTH DAILY. PLEASE SCHEDULE OFFICE VISIT FOR FURTHER REFILLS. THANK YOU! 90 tablet 0   Multiple Vitamin (MULTIVITAMIN) tablet Take 1 tablet by mouth daily.     nitroGLYCERIN (NITROSTAT) 0.4 MG SL tablet Place 1 tablet (0.4 mg total) under the tongue every 5 (five) minutes as needed for chest pain. 30 tablet 3   Potassium Chloride ER 20 MEQ TBCR Take 20 mEq by mouth 2 (two) times daily as needed. 180 tablet 3   propranolol (INDERAL) 20 MG tablet Take 1 tablet (20 mg total) by mouth 3 (three) times daily as needed. Reported on 03/22/2016 270 tablet 3   rosuvastatin (CRESTOR) 10 MG tablet TAKE 1 TABLET (10 MG TOTAL) BY MOUTH DAILY AT 2 PM. 90 tablet 3   sildenafil (REVATIO) 20 MG tablet Take 1 tablet (20 mg total) by mouth 3 (three) times daily. 90 tablet 1   venlafaxine XR (EFFEXOR-XR) 150 MG 24 hr capsule Take 1 capsule (150 mg total) by mouth daily with breakfast. 90 capsule 0   vitamin B-12 (CYANOCOBALAMIN) 500  MCG tablet Take 500 mcg by mouth daily.     vitamin E 400 UNIT capsule Take 400 Units by mouth daily.     warfarin (COUMADIN) 5 MG tablet TAKE 1 OR 2 TABLETS BY MOUTH EVERY DAY AS DIRECTED BY ANTI-COAG CLINIC 45 tablet 3   HYDROcodone-acetaminophen (NORCO) 10-325 MG tablet Take 1 tablet by mouth every 8 (eight) hours as needed. (Patient not taking: Reported on 03/22/2022) 90 tablet 0   Semaglutide-Weight Management 1 MG/0.5ML SOAJ Inject 1 mg into the skin once a week. (Patient not taking: Reported on 03/18/2022) 2 mL 2   No current facility-administered medications on file prior to visit.     Allergies:   Eliquis [apixaban] and Xarelto [rivaroxaban]   Social History:  The patient  reports that he has never smoked. He has  never used smokeless tobacco. He reports current alcohol use of about 2.0 standard drinks of alcohol per week. He reports that he does not use drugs.   Family History:   family history includes Alzheimer's disease in his father; Dementia in his mother; Diabetes in his father.    Review of Systems: Review of Systems  Constitutional:  Positive for weight loss.  Respiratory: Negative.    Cardiovascular: Negative.   Gastrointestinal: Negative.   Musculoskeletal:  Positive for joint pain.       Gait instability  Neurological: Negative.   Psychiatric/Behavioral: Negative.    All other systems reviewed and are negative.   PHYSICAL EXAM: VS:  BP 118/70 (BP Location: Left Arm, Patient Position: Sitting, Cuff Size: Large)   Pulse 63   Ht 6\' 3"  (1.905 m)   Wt (!) 354 lb 6 oz (160.7 kg)   SpO2 98%   BMI 44.29 kg/m  , BMI Body mass index is 44.29 kg/m. Constitutional:  oriented to person, place, and time. No distress.  HENT:  Head: Grossly normal Eyes:  no discharge. No scleral icterus.  Neck: No JVD, no carotid bruits  Cardiovascular: Regular rate and rhythm, no murmurs appreciated Pulmonary/Chest: Clear to auscultation bilaterally, no wheezes or rails Abdominal:  Soft.  no distension.  no tenderness.  Musculoskeletal: Normal range of motion Neurological:  normal muscle tone. Coordination normal. No atrophy Skin: Skin warm and dry Psychiatric: normal affect, pleasant  Recent Labs: 03/19/2022: ALT 36; BUN 23; Creat 0.95; Hemoglobin 14.3; Platelets 178; Potassium 4.1; Sodium 140; TSH 2.01    Lipid Panel Lab Results  Component Value Date   CHOL 118 03/19/2022   HDL 48 03/19/2022   LDLCALC 48 03/19/2022   TRIG 140 03/19/2022      Wt Readings from Last 3 Encounters:  03/22/22 (!) 354 lb 6 oz (160.7 kg)  03/19/22 (!) 352 lb 9.6 oz (159.9 kg)  03/02/22 (!) 351 lb (159.2 kg)     ASSESSMENT AND PLAN:  Atrial fibrillation, unspecified type ([employer]) - Plan: EKG 12-Lead Maintaining normal sinus rhythm On amiodarone.  Previously on Pradaxa changed to warfarin secondary to cost issues Surveillance labs within normal range Recommend he continue amiodarone daily High risk for sleep apnea He is asymptomatic  Essential hypertension - Plan: EKG 12-Lead Blood pressure is well controlled on today's visit. No changes made to the medications.  Acute on chronic diastolic CHF (congestive heart failure) (HCC) Appears euvolemic, still with trace lower extremity edema Lab work stable, fluid moderation recommended Further weight loss suggested  Severe obesity (BMI >= 40) ([place]) We have encouraged continued exercise, careful diet management in an effort to lose weight.  Chronic joint pain, neck pain Unable to exercise secondary to chronic knee pain  On Vicodin for pain control   Total encounter time more than 30 minutes  Greater than 50% was spent in counseling and coordination of care with the patient    Orders Placed This Encounter  Procedures   EKG 12-Lead     Signed, Esmond Plants, M.D., Ph.D. 03/22/2022  [place], [place]

## 2022-03-21 NOTE — Assessment & Plan Note (Signed)
Trial of sildenafil.  He is advised of the risk of combining with nitroglycerin

## 2022-03-21 NOTE — Assessment & Plan Note (Signed)
He refuses PT referral and attributes his recent falls to being tripped by his dogs.  He has had no  Injuires.  He is warned of the risks of catastrophic head trauma due to use of warfarin.  He is competent to make his own decision

## 2022-03-21 NOTE — Assessment & Plan Note (Addendum)
MRI ordered by Dr Katrinka Blazing for evaluation of persistent neck and left  shoulder paina and neck pain .  Referring to Neurology for EMG/nerve conduction studies to rule out myelopathy

## 2022-03-21 NOTE — Assessment & Plan Note (Signed)
He has been noting  A lack of ejaculate for several months, and having ED, minimal erection in the morning . Referring to Urology.

## 2022-03-22 ENCOUNTER — Ambulatory Visit: Payer: PPO | Admitting: Cardiovascular Disease

## 2022-03-22 ENCOUNTER — Encounter: Payer: Self-pay | Admitting: Cardiovascular Disease

## 2022-03-22 VITALS — BP 118/70 | HR 63 | Ht 75.0 in | Wt 354.4 lb

## 2022-03-22 DIAGNOSIS — I4819 Other persistent atrial fibrillation: Secondary | ICD-10-CM

## 2022-03-22 DIAGNOSIS — I5043 Acute on chronic combined systolic (congestive) and diastolic (congestive) heart failure: Secondary | ICD-10-CM

## 2022-03-22 DIAGNOSIS — I10 Essential (primary) hypertension: Secondary | ICD-10-CM

## 2022-03-22 DIAGNOSIS — I872 Venous insufficiency (chronic) (peripheral): Secondary | ICD-10-CM | POA: Diagnosis not present

## 2022-03-22 DIAGNOSIS — E7849 Other hyperlipidemia: Secondary | ICD-10-CM

## 2022-03-22 DIAGNOSIS — Z86718 Personal history of other venous thrombosis and embolism: Secondary | ICD-10-CM | POA: Diagnosis not present

## 2022-03-22 MED ORDER — METOPROLOL SUCCINATE ER 25 MG PO TB24
ORAL_TABLET | ORAL | 3 refills | Status: DC
Start: 2022-03-22 — End: 2022-04-01

## 2022-03-22 NOTE — Patient Instructions (Signed)
Medication Instructions:  No changes  If you need a refill on your cardiac medications before your next appointment, please call your pharmacy.   Lab work: No new labs needed  Testing/Procedures: No new testing needed  Follow-Up: At [facility], you and your health needs are our priority.  As part of our continuing mission to provide you with exceptional heart care, we have created designated Provider Care Teams.  These Care Teams include your primary Cardiologist (physician) and Advanced Practice Providers (APPs -  Physician Assistants and Nurse Practitioners) who all work together to provide you with the care you need, when you need it.  You will need a follow up appointment in 12 months  Providers on your designated Care Team:   Christopher Berge, NP [name] Dunn, PA-C Cadence Furth, PA-C  COVID-19 Vaccine Information can be found at: [email or web address] For questions related to vaccine distribution or appointments, please email [email or web address] or call [phone].   

## 2022-03-24 ENCOUNTER — Encounter: Payer: Self-pay | Admitting: Urology

## 2022-03-24 ENCOUNTER — Ambulatory Visit (INDEPENDENT_AMBULATORY_CARE_PROVIDER_SITE_OTHER): Payer: PPO | Admitting: Urology

## 2022-03-24 VITALS — BP 157/81 | HR 64 | Ht 75.0 in | Wt 354.0 lb

## 2022-03-24 DIAGNOSIS — N529 Male erectile dysfunction, unspecified: Secondary | ICD-10-CM

## 2022-03-24 DIAGNOSIS — R399 Unspecified symptoms and signs involving the genitourinary system: Secondary | ICD-10-CM | POA: Diagnosis not present

## 2022-03-24 DIAGNOSIS — Z125 Encounter for screening for malignant neoplasm of prostate: Secondary | ICD-10-CM | POA: Diagnosis not present

## 2022-03-24 NOTE — Progress Notes (Signed)
03/24/22 3:47 PM   Johnathan [name] 05-23-1956 [id number]  CC: ED, urinary symptoms, PSA screening  HPI: Extremely comorbid 66 year old male referred for the above issues.  His past medical history is notable for morbid obesity with BMI of 44, congestive heart failure, diabetes, history of DVT, sleep apnea noncompliant with CPAP, chronic pain on Vicodin 3 times daily, hypertension on multiple medications, and anticoagulation with Coumadin.  His primary urinary complaints are some urgency and leakage with standing.  He drinks 4 diet sodas during the day.  He also reports erectile dysfunction for at least the last 3 years.  He was prescribed sildenafil, but has not tried this medication.  A prior testosterone in 2020 was low at 240.  He also has concerns about PSA screening.  PSA was normal in January 2023 at 0.68.   PMH: Past Medical History:  Diagnosis Date   Arthritis    a. chronic joint pain   B12 deficiency    Chronic systolic CHF (congestive heart failure) ([facility])    a. echo 2013: EF of 50-55%, normal right ventricular systolic pressure, normal left atrium; b. EF 40-45%, inadequate for LV wall motion, mild MR, LA severely dilated @ 52 mm, PASP nl   Erythema migrans (Lyme disease) 04/08/2015   History of DVT (deep vein thrombosis) 2004   s/p bariatric surgery   History of stress test    a. there was no ST segment deviation noted during stress,    There is a small defect of moderate severity present in the apex location, suggestive of apical ischemia, intermediate risk, calculated EF 21% but visually appears to be 35-40%   Hypertension    Hypothyroidism    Neuropathy    Obesity    OSA (obstructive sleep apnea)    a. not compliant with CPAP   PAF (paroxysmal atrial fibrillation) (HCC)    a. CHADSVASc at least 2 (HTN and vascular disease); b. on Eliquis     Surgical History: Past Surgical History:  Procedure Laterality Date   ARTHROSCOPIC REPAIR ACL Bilateral 1993    bilateral knee x2 on left and x2 on right   BARIATRIC SURGERY  2004   Rou en Y    BARIATRIC SURGERY     CHOLECYSTECTOMY  1982   ELECTROPHYSIOLOGIC STUDY N/A 06/20/2015   Procedure: CARDIOVERSION;  Surgeon: Antonieta Iba, MD;  Location: [facility] ORS;  Service: Cardiovascular;  Laterality: N/A;   KNEE ARTHROPLASTY Right 06/20/2017   Procedure: COMPUTER ASSISTED TOTAL KNEE ARTHROPLASTY;  Surgeon: Donato Heinz, MD;  Location: [facility] ORS;  Service: Orthopedics;  Laterality: Right;   MENISCUS DEBRIDEMENT Left 2011   Left knee   REPLACEMENT TOTAL KNEE Left 07/2013   TONSILLECTOMY       Family History: Family History  Problem Relation Age of Onset   Dementia Mother    Diabetes Father    Alzheimer's disease Father     Social History:  reports that he has never smoked. He has never been exposed to tobacco smoke. He has never used smokeless tobacco. He reports current alcohol use of about 2.0 standard drinks of alcohol per week. He reports that he does not use drugs.  Physical Exam: BP (!) 157/81   Pulse 64   Ht 6\' 3"  (1.905 m)   Wt (!) 354 lb (160.6 kg)   BMI 44.25 kg/m    Constitutional:  Alert and oriented, No acute distress. Cardiovascular: No clubbing, cyanosis, or edema. Respiratory: Normal respiratory effort, no increased work of breathing.  GI: Abdomen is soft, nontender, nondistended, no abdominal masses   Laboratory Data: Reviewed, see HPI   Assessment & Plan:   Extremely comorbid 66 year old male referred to urology for urinary symptoms, ED, and to discuss PSA.  I had a very frank conversation with the patient that he has a number of comorbidities impacting his urinary symptoms and ED including morbid obesity, diabetes, chronic pain on narcotics, heart disease, blood pressure medications, antidepressants.  I think we need to have realistic expectations moving forward.  Regarding the urinary symptoms, I recommended checking a urinalysis today and will call with those  results.  I also recommended cutting back on artificial sweeteners and sodas, and considering timed voiding.  We discussed weight loss as an important strategy.  We also discussed the effect of his diuretics and diabetes on increased urination.  Regarding his ED, he has a number of risk factors, mentioned above.  It is reasonable to try sildenafil up to 100 mg on demand to see if this improves erections, and we discussed this cannot be taken with his nitroglycerin.  He reports he does not take the nitro ever.  He is interested in checking a testosterone, and we will arrange a morning testosterone.  Again, I think we need to have realistic expectations with his comorbidities.  Finally, PSA is well within the normal range, and with his comorbidities he is not a good candidate to continue yearly PSA screening.  Trial of sildenafil for ED Follow-up for urinalysis and morning testosterone results RTC PA symptom check 1 month   [name], MD 03/24/2022  [facility] [address], [address] [place], [place] [address] [phone]

## 2022-03-24 NOTE — Patient Instructions (Signed)
Erectile Dysfunction Erectile dysfunction (ED) is the inability to get or keep an erection in order to have sexual intercourse. ED is considered a symptom of an underlying disorder and is not considered a disease. ED may include: Inability to get an erection. Lack of enough hardness of the erection to allow penetration. Loss of erection before sex is finished. What are the causes? This condition may be caused by: Physical causes, such as: Artery problems. This may include heart disease, high blood pressure, atherosclerosis, and diabetes. Hormonal problems, such as low testosterone. Obesity. Nerve problems. This may include back or pelvic injuries, multiple sclerosis, Parkinson's disease, spinal cord injury, and stroke. Certain medicines, such as: Pain relievers. Antidepressants. Blood pressure medicines and water pills (diuretics). Cancer medicines. Antihistamines. Muscle relaxants. Lifestyle factors, such as: Use of drugs such as marijuana, cocaine, or opioids. Excessive use of alcohol. Smoking. Lack of physical activity or exercise. Psychological causes, such as: Anxiety or stress. Sadness or depression. Exhaustion. Fear about sexual performance. Guilt. What are the signs or symptoms? Symptoms of this condition include: Inability to get an erection. Lack of enough hardness of the erection to allow penetration. Loss of the erection before sex is finished. Sometimes having normal erections, but with frequent unsatisfactory episodes. Low sexual satisfaction in either partner due to erection problems. A curved penis occurring with erection. The curve may cause pain, or the penis may be too curved to allow for intercourse. Never having nighttime or morning erections. How is this diagnosed? This condition is often diagnosed by: Performing a physical exam to find other diseases or specific problems with the penis. Asking you detailed questions about the problem. Doing tests,  such as: Blood tests to check for diabetes mellitus or high cholesterol, or to measure hormone levels. Other tests to check for underlying health conditions. An ultrasound exam to check for scarring. A test to check blood flow to the penis. Doing a sleep study at home to measure nighttime erections. How is this treated? This condition may be treated by: Medicines, such as: Medicine taken by mouth to help you achieve an erection (oral medicine). Hormone replacement therapy to replace low testosterone levels. Medicine that is injected into the penis. Your health care provider may instruct you how to give yourself these injections at home. Medicine that is delivered with a short applicator tube. The tube is inserted into the opening at the tip of the penis, which is the opening of the urethra. A tiny pellet of medicine is put in the urethra. The pellet dissolves and enhances erectile function. This is also called MUSE (medicated urethral system for erections) therapy. Vacuum pump. This is a pump with a ring on it. The pump and ring are placed on the penis and used to create pressure that helps the penis become erect. Penile implant surgery. In this procedure, you may receive: An inflatable implant. This consists of cylinders, a pump, and a reservoir. The cylinders can be inflated with a fluid that helps to create an erection, and they can be deflated after intercourse. A semi-rigid implant. This consists of two silicone rubber rods. The rods provide some rigidity. They are also flexible, so the penis can both curve downward in its normal position and become straight for sexual intercourse. Blood vessel surgery to improve blood flow to the penis. During this procedure, a blood vessel from a different part of the body is placed into the penis to allow blood to flow around (bypass) damaged or blocked blood vessels. Lifestyle changes,  such as exercising more, losing weight, and quitting smoking. Follow  these instructions at home: Medicines  Take over-the-counter and prescription medicines only as told by your health care provider. Do not increase the dosage without first discussing it with your health care provider. If you are using self-injections, do injections as directed by your health care provider. Make sure you avoid any veins that are on the surface of the penis. After giving an injection, apply pressure to the injection site for 5 minutes. Talk to your health care provider about how to prevent headaches while taking ED medicines. These medicines may cause a sudden headache due to the increase in blood flow in your body. General instructions Exercise regularly, as directed by your health care provider. Work with your health care provider to lose weight, if needed. Do not use any products that contain nicotine or tobacco. These products include cigarettes, chewing tobacco, and vaping devices, such as e-cigarettes. If you need help quitting, ask your health care provider. Before using a vacuum pump, read the instructions that come with the pump and discuss any questions with your health care provider. Keep all follow-up visits. This is important. Contact a health care provider if: You feel nauseous. You are vomiting. You get sudden headaches while taking ED medicines. You have any concerns about your sexual health. Get help right away if: You are taking oral or injectable medicines and you have an erection that lasts longer than 4 hours. If your health care provider is unavailable, go to the nearest emergency room for evaluation. An erection that lasts much longer than 4 hours can result in permanent damage to your penis. You have severe pain in your groin or abdomen. You develop redness or severe swelling of your penis. You have redness spreading at your groin or lower abdomen. You are unable to urinate. You experience chest pain or a rapid heartbeat (palpitations) after taking oral  medicines. These symptoms may represent a serious problem that is an emergency. Do not wait to see if the symptoms will go away. Get medical help right away. Call your local emergency services (911 in the [place] Do not drive yourself to the hospital. Summary Erectile dysfunction (ED) is the inability to get or keep an erection during sexual intercourse. This condition is diagnosed based on a physical exam, your symptoms, and tests to determine the cause. Treatment varies depending on the cause and may include medicines, hormone therapy, surgery, or a vacuum pump. You may need follow-up visits to make sure that you are using your medicines or devices correctly. Get help right away if you are taking or injecting medicines and you have an erection that lasts longer than 4 hours. This information is not intended to replace advice given to you by your health care provider. Make sure you discuss any questions you have with your health care provider. Document Revised: 12/24/2020 Document Reviewed: 12/24/2020 Elsevier Patient Education  2023 [employer]  Overactive Bladder, Adult  Overactive bladder is a condition in which a person has a sudden and frequent need to urinate. A person might also leak urine if he or she cannot get to the bathroom fast enough (urinary incontinence). Sometimes, symptoms can interfere with work or social activities. What are the causes? Overactive bladder is associated with poor nerve signals between your bladder and your brain. Your bladder may get the signal to empty before it is full. You may also have very sensitive muscles that make your bladder squeeze too soon. This condition  may also be caused by other factors, such as: Medical conditions: Urinary tract infection. Infection of nearby tissues. Prostate enlargement. Bladder stones, inflammation, or tumors. Diabetes. Muscle or nerve weakness, especially from these conditions: A spinal cord injury. Stroke. Multiple  sclerosis. Parkinson's disease. Other causes: Surgery on the uterus or urethra. Drinking too much caffeine or alcohol. Certain medicines, especially those that eliminate extra fluid in the body (diuretics). Constipation. What increases the risk? You may be at greater risk for overactive bladder if you: Are an older adult. Smoke. Are going through menopause. Have prostate problems. Have a neurological disease, such as stroke, dementia, Parkinson's disease, or multiple sclerosis (MS). Eat or drink alcohol, spicy food, caffeine, and other things that irritate the bladder. Are overweight or obese. What are the signs or symptoms? Symptoms of this condition include a sudden, strong urge to urinate. Other symptoms include: Leaking urine. Urinating 8 or more times a day. Waking up to urinate 2 or more times overnight. How is this diagnosed? This condition may be diagnosed based on: Your symptoms and medical history. A physical exam. Blood or urine tests to check for possible causes, such as infection. You may also need to see a health care provider who specializes in urinary tract problems. This is called a urologist. How is this treated? Treatment for overactive bladder depends on the cause of your condition and whether it is mild or severe. Treatment may include: Bladder training, such as: Learning to control the urge to urinate by following a schedule to urinate at regular intervals. Doing Kegel exercises to strengthen the pelvic floor muscles that support your bladder. Special devices, such as: Biofeedback. This uses sensors to help you become aware of your body's signals. Electrical stimulation. This uses electrodes placed inside the body (implanted) or outside the body. These electrodes send gentle pulses of electricity to strengthen the nerves or muscles that control the bladder. Women may use a plastic device, called a pessary, that fits into the vagina and supports the  bladder. Medicines, such as: Antibiotics to treat bladder infection. Antispasmodics to stop the bladder from releasing urine at the wrong time. Tricyclic antidepressants to relax bladder muscles. Injections of botulinum toxin type A directly into the bladder tissue to relax bladder muscles. Surgery, such as: A device may be implanted to help manage the nerve signals that control urination. An electrode may be implanted to stimulate electrical signals in the bladder. A procedure may be done to change the shape of the bladder. This is done only in very severe cases. Follow these instructions at home: Eating and drinking  Make diet or lifestyle changes recommended by your health care provider. These may include: Drinking fluids throughout the day and not only with meals. Cutting down on caffeine or alcohol. Eating a healthy and balanced diet to prevent constipation. This may include: Choosing foods that are high in fiber, such as beans, whole grains, and fresh fruits and vegetables. Limiting foods that are high in fat and processed sugars, such as fried and sweet foods. Lifestyle  Lose weight if needed. Do not use any products that contain nicotine or tobacco. These include cigarettes, chewing tobacco, and vaping devices, such as e-cigarettes. If you need help quitting, ask your health care provider. General instructions Take over-the-counter and prescription medicines only as told by your health care provider. If you were prescribed an antibiotic medicine, take it as told by your health care provider. Do not stop taking the antibiotic even if you start to feel better.  Use any implants or pessary as told by your health care provider. If needed, wear pads to absorb urine leakage. Keep a log to track how much and when you drink, and when you need to urinate. This will help your health care provider monitor your condition. Keep all follow-up visits. This is important. Contact a health care  provider if: You have a fever or chills. Your symptoms do not get better with treatment. Your pain and discomfort get worse. You have more frequent urges to urinate. Get help right away if: You are not able to control your bladder. Summary Overactive bladder refers to a condition in which a person has a sudden and frequent need to urinate. Several conditions may lead to an overactive bladder. Treatment for overactive bladder depends on the cause and severity of your condition. Making lifestyle changes, doing Kegel exercises, keeping a log, and taking medicines can help with this condition. This information is not intended to replace advice given to you by your health care provider. Make sure you discuss any questions you have with your health care provider. Document Revised: 06/16/2020 Document Reviewed: 06/16/2020 Elsevier Patient Education  2023 [employer].

## 2022-03-25 ENCOUNTER — Other Ambulatory Visit: Payer: Self-pay | Admitting: Internal Medicine

## 2022-03-25 MED ORDER — VENLAFAXINE HCL ER 150 MG PO CP24: 150.0000 mg | ORAL_CAPSULE | Freq: Every day | ORAL | 0 refills | Status: DC

## 2022-03-30 ENCOUNTER — Other Ambulatory Visit
Admission: RE | Admit: 2022-03-30 | Discharge: 2022-03-30 | Disposition: A | Discharge status: Home / Self Care | Payer: PPO | Attending: Urology | Admitting: Urology

## 2022-03-30 DIAGNOSIS — N529 Male erectile dysfunction, unspecified: Secondary | ICD-10-CM | POA: Insufficient documentation

## 2022-03-30 LAB — URINALYSIS, COMPLETE (UACMP) WITH MICROSCOPIC
Bacteria, UA: NONE SEEN
Bilirubin Urine: NEGATIVE
Glucose, UA: NEGATIVE mg/dL
Hgb urine dipstick: NEGATIVE
Ketones, ur: NEGATIVE mg/dL
Leukocytes,Ua: NEGATIVE
Nitrite: NEGATIVE
Protein, ur: NEGATIVE mg/dL
Specific Gravity, Urine: 1.02 (ref 1.005–1.030)
WBC, UA: NONE SEEN WBC/hpf (ref 0–5)
pH: 5.5 (ref 5.0–8.0)

## 2022-03-31 LAB — TESTOSTERONE: Testosterone: 343 ng/dL (ref 264–916)

## 2022-04-01 ENCOUNTER — Other Ambulatory Visit: Payer: Self-pay | Admitting: Cardiovascular Disease

## 2022-04-01 ENCOUNTER — Other Ambulatory Visit: Payer: Self-pay | Admitting: Family Medicine

## 2022-04-01 NOTE — Telephone Encounter (Signed)
Please advise if ok to refill Levothyroxine 25 mcg qd.

## 2022-04-06 ENCOUNTER — Telehealth: Payer: Self-pay | Admitting: Cardiovascular Disease

## 2022-04-07 ENCOUNTER — Telehealth: Payer: Self-pay | Admitting: Internal Medicine

## 2022-04-07 MED ORDER — LEVOTHYROXINE SODIUM 25 MCG PO TABS: 25.0000 ug | ORAL_TABLET | Freq: Every day | ORAL | 3 refills | Status: DC

## 2022-04-07 NOTE — Telephone Encounter (Signed)
Medication has been refilled and pt is aware.  

## 2022-04-07 NOTE — Telephone Encounter (Signed)
Pt spouse called stating pt need refill on levothyroxine sent to [facility] in [place]. Pt wife request to be called

## 2022-04-08 NOTE — Telephone Encounter (Signed)
Pt aware and PCP has sent in pt's request.

## 2022-04-21 ENCOUNTER — Ambulatory Visit (INDEPENDENT_AMBULATORY_CARE_PROVIDER_SITE_OTHER): Payer: PPO

## 2022-04-21 DIAGNOSIS — Z5181 Encounter for therapeutic drug level monitoring: Secondary | ICD-10-CM

## 2022-04-21 DIAGNOSIS — Z86718 Personal history of other venous thrombosis and embolism: Secondary | ICD-10-CM

## 2022-04-21 DIAGNOSIS — I4819 Other persistent atrial fibrillation: Secondary | ICD-10-CM

## 2022-04-21 LAB — POCT INR: INR: 1.8 — AB (ref 2.0–3.0)

## 2022-04-21 NOTE — Patient Instructions (Signed)
Description   Take an extra 1/2 tablet today, then start taking Warfarin 1 tablet every day EXCEPT 1.5 tablets on MONDAYS, WEDNESDAYS, FRIDAYS AND SATURDAYS.  - Recheck INR in 4 weeks TRY TO GET YOUR GREENS ON A SCHEDULE.  PICK A DAY (OR DAYS) AND HAVE THEM ON THE SAME DAY EACH WEEK

## 2022-04-26 ENCOUNTER — Ambulatory Visit (INDEPENDENT_AMBULATORY_CARE_PROVIDER_SITE_OTHER): Payer: PPO | Admitting: Urology

## 2022-04-26 ENCOUNTER — Encounter: Payer: Self-pay | Admitting: Urology

## 2022-04-26 VITALS — BP 152/83 | HR 69 | Ht 75.0 in | Wt 350.0 lb

## 2022-04-26 DIAGNOSIS — R399 Unspecified symptoms and signs involving the genitourinary system: Secondary | ICD-10-CM

## 2022-04-26 DIAGNOSIS — N529 Male erectile dysfunction, unspecified: Secondary | ICD-10-CM | POA: Diagnosis not present

## 2022-04-26 DIAGNOSIS — R32 Unspecified urinary incontinence: Secondary | ICD-10-CM

## 2022-04-26 LAB — BLADDER SCAN AMB NON-IMAGING: Scan Result: 0

## 2022-04-26 MED ORDER — TADALAFIL 20 MG PO TABS: 20.0000 mg | ORAL_TABLET | Freq: Every day | ORAL | 1 refills | Status: DC | PRN

## 2022-04-26 NOTE — Progress Notes (Unsigned)
[name] [address] [place] [address] Phone: [phone] Subjective:   [name], am serving as a scribe for Dr. Antoine Primas.  I'm seeing this patient by the request  of:  Sherlene Shams, MD  CC: Neck pain follow-up  [id number]  03/02/2022 Known arthritic changes noted.  Discussed which activities to do which ones to avoid.  Patient is having more radicular symptoms and does have weakness noted in the C7 and C8 distribution with a positive Spurling's at this time.  Failed conservative therapy and I do feel with the weakness noted advanced imaging is warranted.  Patient could be a potential candidate for an epidural.  Patient has many other medical problems which makes different medications very difficult as well.  Update 04/27/2022 AMONTE BROOKOVER is a 66 y.o. male coming in with complaint of neck pain. Patient states that he is the same as last visit. Using pillow behind neck which is helpful to alleviate pressure on nerve. Taking vicodin TID. Left arm continues to go numb.   MRI 03/15/2022 Cervical spine  IMPRESSION: 1. Degenerative cervical spondylosis with multilevel disc disease and facet disease. 2. Moderate to large right-sided disc extrusion at C4-5 with mass effect on the right side of the cervical cord. 3. Broad-based disc protrusion at C5-6 asymmetric right with asymmetric mass effect on the right side of the thecal sac. There is also mild medial foraminal encroachment bilaterally. 4. Broad-based central disc protrusion at C6-7 with flattening of the ventral thecal sac and near obliteration of the ventral CSF space. Mild foraminal narrowing bilaterally also noted. 5. Diffuse bulging annulus at C7-T1 with mild mass effect on both sides of the thecal sac, left greater than right. Mild bilateral foraminal stenosis.     Past Medical History:  Diagnosis Date   Arthritis    a. chronic joint pain   B12 deficiency     Chronic systolic CHF (congestive heart failure) (HCC)    a. echo 2013: EF of 50-55%, normal right ventricular systolic pressure, normal left atrium; b. EF 40-45%, inadequate for LV wall motion, mild MR, LA severely dilated @ 52 mm, PASP nl   Erythema migrans (Lyme disease) 04/08/2015   History of DVT (deep vein thrombosis) 2004   s/p bariatric surgery   History of stress test    a. there was no ST segment deviation noted during stress,    There is a small defect of moderate severity present in the apex location, suggestive of apical ischemia, intermediate risk, calculated EF 21% but visually appears to be 35-40%   Hypertension    Hypothyroidism    Neuropathy    Obesity    OSA (obstructive sleep apnea)    a. not compliant with CPAP   PAF (paroxysmal atrial fibrillation) (HCC)    a. CHADSVASc at least 2 (HTN and vascular disease); b. on Eliquis    Past Surgical History:  Procedure Laterality Date   ARTHROSCOPIC REPAIR ACL Bilateral 1993   bilateral knee x2 on left and x2 on right   BARIATRIC SURGERY  2004   Rou en Y    BARIATRIC SURGERY     CHOLECYSTECTOMY  1982   ELECTROPHYSIOLOGIC STUDY N/A 06/20/2015   Procedure: CARDIOVERSION;  Surgeon: Antonieta Iba, MD;  Location: [facility] ORS;  Service: Cardiovascular;  Laterality: N/A;   KNEE ARTHROPLASTY Right 06/20/2017   Procedure: COMPUTER ASSISTED TOTAL KNEE ARTHROPLASTY;  Surgeon: Donato Heinz, MD;  Location: [facility] ORS;  Service: Orthopedics;  Laterality: Right;   MENISCUS DEBRIDEMENT Left 2011   Left knee   REPLACEMENT TOTAL KNEE Left 07/2013   TONSILLECTOMY     Social History   Socioeconomic History   Marital status: Married    Spouse name: Not on file   Number of children: Not on file   Years of education: Not on file   Highest education level: Not on file  Occupational History   Not on file  Tobacco Use   Smoking status: Never    Passive exposure: Never   Smokeless tobacco: Never  Vaping Use   Vaping Use: Never used   Substance and Sexual Activity   Alcohol use: Yes    Alcohol/week: 2.0 standard drinks of alcohol    Types: 2 Shots of liquor per week    Comment: Vodka and lemonade occasionally 2-3/week    Drug use: No   Sexual activity: Yes    Partners: Female    Comment: Wife  Other Topics Concern   Not on file  Social History Narrative   Currently between jobs   Lives with Wife   1 Son (33) and 1 daughter (48)    2 grandchildren by daughter    1 dog and 1 cat    Enjoys gardening, movies, dining out.    Social Determinants of Health   Financial Resource Strain: Low Risk  (02/22/2022)   Overall Financial Resource Strain (CARDIA)    Difficulty of Paying Living Expenses: Not hard at all  Food Insecurity: No Food Insecurity (02/22/2022)   Hunger Vital Sign    Worried About Running Out of Food in the Last Year: Never true    Ran Out of Food in the Last Year: Never true  Transportation Needs: No Transportation Needs (02/22/2022)   PRAPARE - Administrator, Civil Service (Medical): No    Lack of Transportation (Non-Medical): No  Physical Activity: Not on file  Stress: No Stress Concern Present (02/22/2022)   [employer] of Occupational Health - Occupational Stress Questionnaire    Feeling of Stress : Not at all  Social Connections: Unknown (02/22/2022)   Social Connection and Isolation Panel [NHANES]    Frequency of Communication with Friends and Family: Not on file    Frequency of Social Gatherings with Friends and Family: Not on file    Attends Religious Services: Not on file    Active Member of Clubs or Organizations: Not on file    Attends Banker Meetings: Not on file    Marital Status: Married   Allergies  Allergen Reactions   Eliquis [Apixaban] Itching    "Red spots" and severe itching   Xarelto [Rivaroxaban] Other (See Comments)    Reaction in 2013   Family History  Problem Relation Age of Onset   Dementia Mother    Diabetes Father    Alzheimer's  disease Father     Current Outpatient Medications (Endocrine & Metabolic):    levothyroxine (SYNTHROID) 25 MCG tablet, Take 1 tablet (25 mcg total) by mouth daily before breakfast.  Current Outpatient Medications (Cardiovascular):    amiodarone (PACERONE) 200 MG tablet, Take 1 tablet (200 mg total) by mouth 2 (two) times daily.   furosemide (LASIX) 40 MG tablet, Take 1 tablet (40 mg total) by mouth daily as needed.   hydrochlorothiazide (HYDRODIURIL) 25 MG tablet, Take 1 tablet (25 mg total) by mouth daily.   losartan (COZAAR) 100 MG tablet, TAKE 1 TABLET BY MOUTH EVERY DAY   metoprolol succinate (TOPROL-XL)  25 MG 24 hr tablet, TAKE 1 TABLET BY MOUTH DAILY.   propranolol (INDERAL) 20 MG tablet, Take 1 tablet (20 mg total) by mouth 3 (three) times daily as needed. Reported on 03/22/2016   rosuvastatin (CRESTOR) 10 MG tablet, TAKE 1 TABLET (10 MG TOTAL) BY MOUTH DAILY AT 2 PM.   sildenafil (REVATIO) 20 MG tablet, Take 1 tablet (20 mg total) by mouth 3 (three) times daily.   tadalafil (CIALIS) 20 MG tablet, Take 1 tablet (20 mg total) by mouth daily as needed for erectile dysfunction.   Current Outpatient Medications (Analgesics):    HYDROcodone-acetaminophen (NORCO) 10-325 MG tablet, Take 1 tablet by mouth every 8 (eight) hours as needed.  Current Outpatient Medications (Hematological):    Ferrous Gluconate 324 (37.5 Fe) MG TABS, Take 1 tablet by mouth daily.    vitamin B-12 (CYANOCOBALAMIN) 500 MCG tablet, Take 500 mcg by mouth daily.   warfarin (COUMADIN) 5 MG tablet, TAKE 1 OR 2 TABLETS BY MOUTH EVERY DAY AS DIRECTED BY ANTI-COAG CLINIC  Current Outpatient Medications (Other):    B Complex Vitamins (B COMPLEX 50 PO), Take 1 tablet by mouth daily.    buPROPion (WELLBUTRIN XL) 300 MG 24 hr tablet, Take 1 tablet (300 mg total) by mouth daily.   Cholecalciferol (VITAMIN D3) 2000 UNITS TABS, Take 1 tablet by mouth daily.    docusate sodium (COLACE) 100 MG capsule, Take 400 mg by mouth daily.     gabapentin (NEURONTIN) 300 MG capsule, TAKE 1 CAPSULE BY MOUTH THREE TIMES A DAY   GLUCOSAMINE HCL-MSM PO, Take 1 tablet by mouth daily.    Multiple Vitamin (MULTIVITAMIN) tablet, Take 1 tablet by mouth daily.   Potassium Chloride ER 20 MEQ TBCR, Take 20 mEq by mouth 2 (two) times daily as needed.   venlafaxine XR (EFFEXOR-XR) 150 MG 24 hr capsule, Take 1 capsule (150 mg total) by mouth daily with breakfast.   vitamin E 400 UNIT capsule, Take 400 Units by mouth daily.   Reviewed prior external information including notes and imaging from  primary care provider As well as notes that were available from care everywhere and other healthcare systems.  Past medical history, social, surgical and family history all reviewed in electronic medical record.  No pertanent information unless stated regarding to the chief complaint.   Review of Systems:  No headache, visual changes, nausea, vomiting, diarrhea, constipation, dizziness, abdominal pain, skin rash, fevers, chills, night sweats, weight loss, swollen lymph nodes, body aches, joint swelling, chest pain, shortness of breath, mood changes. POSITIVE muscle aches, body aches   Objective  Blood pressure 128/82, pulse 63, height 6\' 3"  (1.905 m), weight (!) 355 lb (161 kg), SpO2 95 %.   General: No apparent distress alert and oriented x3 mood and affect normal, dressed appropriately.  Overweight HEENT: Pupils equal, extraocular movements intact  Respiratory: Patient's speak in full sentences and does not appear short of breath  Cardiovascular: No lower extremity edema, non tender, no erythema  Antalgic gait walking with the aid of a cane. Neck exam significant loss of lordosis.  Significant crepitus with range of motion noted.  Patient does have arthritic changes of multiple joints but still has good grip strength.  Patient does have 3-5 strength of the rotator cuff on the left side.  Limited range of motion in multiple planes noted.  After  informed written and verbal consent, patient was seated on exam table. Right shoulder was prepped with alcohol swab and utilizing posterior approach, patient's right  glenohumeral space was injected with 4:1  marcaine 0.5%: Kenalog 40mg /dL. Patient tolerated the procedure well without immediate complications.   Impression and Recommendations:     The above documentation has been reviewed and is accurate and complete , DO

## 2022-04-26 NOTE — Progress Notes (Signed)
04/26/22 2:05 PM   Johnathan [name] August 15, 1956 [id number]  Referring provider:  Sherlene Shams, MD [address] [place],  [place] [address]  Chief Complaint  Patient presents with   Erectile Dysfunction    Urological history: ED  - Contributing factors morbid obesity, diabetes, chronic pain on narcotics, heart disease, blood pressure medications, antidepressants. - Started on trial of 100 mg sildenafil 03/24/2022 - Testosterone WNL on 03/30/2022  - SHIM 5   2. Sleep apnea  - Noncompliant with CPAP    HPI: Johnathan [name] is a 66 y.o.male who returns today for a 4 week follow-up with PVR.   He reports today that he never got the 100 mg prescription of sildenafil. He has the 20 mg sildenafil he tried 3 tablets the 5 and ad no improvement. He has spontaneous erections in the morning that is half erect. His symptoms ave been ongoing for 3 years. He tried Viagra 15 year ago and had improvement.  He also reports today decreased interest in sexual intercourse.   He denies pain, trauma or curvature of penis. He is curious if his symptoms are attributed to prostate issues.    He reports urinated every 100-200 minutes nightly. He attributes this to fluid intake. He drinks, water, lemonade, and mountain dew.   Patient denies any modifying or aggravating factors.  Patient denies any gross hematuria, dysuria or suprapubic/flank pain.  Patient denies any fevers, chills, nausea or vomiting.     SHIM     Row Name 04/26/22 1327         SHIM: Over the last 6 months:   How do you rate your confidence that you could get and keep an erection? Very Low     When you had erections with sexual stimulation, how often were your erections hard enough for penetration (entering your partner)? Almost Never or Never     During sexual intercourse, how often were you able to maintain your erection after you had penetrated (entered) your partner? Almost Never or Never     During sexual  intercourse, how difficult was it to maintain your erection to completion of intercourse? Extremely Difficult     When you attempted sexual intercourse, how often was it satisfactory for you? Almost Never or Never       SHIM Total Score   SHIM 5                  PMH: Past Medical History:  Diagnosis Date   Arthritis    a. chronic joint pain   B12 deficiency    Chronic systolic CHF (congestive heart failure) (HCC)    a. echo 2013: EF of 50-55%, normal right ventricular systolic pressure, normal left atrium; b. EF 40-45%, inadequate for LV wall motion, mild MR, LA severely dilated @ 52 mm, PASP nl   Erythema migrans (Lyme disease) 04/08/2015   History of DVT (deep vein thrombosis) 2004   s/p bariatric surgery   History of stress test    a. there was no ST segment deviation noted during stress,    There is a small defect of moderate severity present in the apex location, suggestive of apical ischemia, intermediate risk, calculated EF 21% but visually appears to be 35-40%   Hypertension    Hypothyroidism    Neuropathy    Obesity    OSA (obstructive sleep apnea)    a. not compliant with CPAP   PAF (paroxysmal atrial fibrillation) (HCC)    a.  CHADSVASc at least 2 (HTN and vascular disease); b. on Eliquis     Surgical History: Past Surgical History:  Procedure Laterality Date   ARTHROSCOPIC REPAIR ACL Bilateral 1993   bilateral knee x2 on left and x2 on right   BARIATRIC SURGERY  2004   Rou en Y    BARIATRIC SURGERY     CHOLECYSTECTOMY  1982   ELECTROPHYSIOLOGIC STUDY N/A 06/20/2015   Procedure: CARDIOVERSION;  Surgeon: Antonieta Iba, MD;  Location: [facility] ORS;  Service: Cardiovascular;  Laterality: N/A;   KNEE ARTHROPLASTY Right 06/20/2017   Procedure: COMPUTER ASSISTED TOTAL KNEE ARTHROPLASTY;  Surgeon: Donato Heinz, MD;  Location: [facility] ORS;  Service: Orthopedics;  Laterality: Right;   MENISCUS DEBRIDEMENT Left 2011   Left knee   REPLACEMENT TOTAL KNEE Left 07/2013    TONSILLECTOMY      Home Medications:  Allergies as of 04/26/2022       Reactions   Eliquis [apixaban] Itching   "Red spots" and severe itching   Xarelto [rivaroxaban] Other (See Comments)   Reaction in 2013        Medication List        Accurate as of April 26, 2022  2:05 PM. If you have any questions, ask your nurse or doctor.          STOP taking these medications    nitroGLYCERIN 0.4 MG SL tablet Commonly known as: NITROSTAT Stopped by: Johnathan Farone, PA-C       TAKE these medications    amiodarone 200 MG tablet Commonly known as: PACERONE Take 1 tablet (200 mg total) by mouth 2 (two) times daily.   B COMPLEX 50 PO Take 1 tablet by mouth daily.   buPROPion 300 MG 24 hr tablet Commonly known as: WELLBUTRIN XL Take 1 tablet (300 mg total) by mouth daily.   docusate sodium 100 MG capsule Commonly known as: COLACE Take 400 mg by mouth daily.   Ferrous Gluconate 324 (37.5 Fe) MG Tabs Take 1 tablet by mouth daily.   furosemide 40 MG tablet Commonly known as: LASIX Take 1 tablet (40 mg total) by mouth daily as needed.   gabapentin 300 MG capsule Commonly known as: NEURONTIN TAKE 1 CAPSULE BY MOUTH THREE TIMES A DAY   GLUCOSAMINE HCL-MSM PO Take 1 tablet by mouth daily.   hydrochlorothiazide 25 MG tablet Commonly known as: HYDRODIURIL Take 1 tablet (25 mg total) by mouth daily.   HYDROcodone-acetaminophen 10-325 MG tablet Commonly known as: NORCO Take 1 tablet by mouth every 8 (eight) hours as needed.   levothyroxine 25 MCG tablet Commonly known as: SYNTHROID Take 1 tablet (25 mcg total) by mouth daily before breakfast.   losartan 100 MG tablet Commonly known as: COZAAR TAKE 1 TABLET BY MOUTH EVERY DAY   metoprolol succinate 25 MG 24 hr tablet Commonly known as: TOPROL-XL TAKE 1 TABLET BY MOUTH DAILY.   multivitamin tablet Take 1 tablet by mouth daily.   Potassium Chloride ER 20 MEQ Tbcr Take 20 mEq by mouth 2 (two) times daily as  needed.   propranolol 20 MG tablet Commonly known as: INDERAL Take 1 tablet (20 mg total) by mouth 3 (three) times daily as needed. Reported on 03/22/2016   rosuvastatin 10 MG tablet Commonly known as: CRESTOR TAKE 1 TABLET (10 MG TOTAL) BY MOUTH DAILY AT 2 PM.   sildenafil 20 MG tablet Commonly known as: REVATIO Take 1 tablet (20 mg total) by mouth 3 (three) times daily.   tadalafil  20 MG tablet Commonly known as: CIALIS Take 1 tablet (20 mg total) by mouth daily as needed for erectile dysfunction. Started by: Michiel Cowboy, PA-C   venlafaxine XR 150 MG 24 hr capsule Commonly known as: EFFEXOR-XR Take 1 capsule (150 mg total) by mouth daily with breakfast.   vitamin B-12 500 MCG tablet Commonly known as: CYANOCOBALAMIN Take 500 mcg by mouth daily.   Vitamin D3 50 MCG (2000 UT) Tabs Take 1 tablet by mouth daily.   vitamin E 180 MG (400 UNITS) capsule Take 400 Units by mouth daily.   warfarin 5 MG tablet Commonly known as: COUMADIN Take as directed by the anticoagulation clinic. If you are unsure how to take this medication, talk to your nurse or doctor. Original instructions: TAKE 1 OR 2 TABLETS BY MOUTH EVERY DAY AS DIRECTED BY ANTI-COAG CLINIC        Allergies:  Allergies  Allergen Reactions   Eliquis [Apixaban] Itching    "Red spots" and severe itching   Xarelto [Rivaroxaban] Other (See Comments)    Reaction in 2013    Family History: Family History  Problem Relation Age of Onset   Dementia Mother    Diabetes Father    Alzheimer's disease Father     Social History:  reports that he has never smoked. He has never been exposed to tobacco smoke. He has never used smokeless tobacco. He reports current alcohol use of about 2.0 standard drinks of alcohol per week. He reports that he does not use drugs.   Physical Exam: BP (!) 152/83   Pulse 69   Ht 6\' 3"  (1.905 m)   Wt (!) 350 lb (158.8 kg)   BMI 43.75 kg/m   Constitutional:  Alert and oriented, No  acute distress. HEENT: [place] AT, moist mucus membranes.  Trachea midline Cardiovascular: No clubbing, cyanosis, or edema. Respiratory: Normal respiratory effort, no increased work of breathing. Neurologic: Grossly intact, no focal deficits, moving all 4 extremities. Psychiatric: Normal mood and affect.  Laboratory Data: Lab Results  Component Value Date   CREATININE 0.95 03/19/2022   Lab Results  Component Value Date   HGBA1C 5.4 03/19/2022   Component     Latest Ref Rng 10/16/2021  PSA     0.10 - 4.00 ng/mL 0.68     Component     Latest Ref Rng 03/30/2022  Testosterone     264 - 916 ng/dL 04/01/2022     Component     Latest Ref Rng 03/19/2022  Sodium     135 - 146 mmol/L 140   Potassium     3.5 - 5.3 mmol/L 4.1   Chloride     98 - 110 mmol/L 106   CO2     20 - 32 mmol/L 24   Glucose     65 - 99 mg/dL 83   BUN     7 - 25 mg/dL 23   Creatinine     05/19/2022 - 1.35 mg/dL 4.13   Calcium     8.6 - 10.3 mg/dL 8.7   Total Protein     6.1 - 8.1 g/dL 6.9   AST     10 - 35 U/L 23   ALT     9 - 46 U/L 36   Total Bilirubin     0.2 - 1.2 mg/dL 0.5   BUN/Creatinine Ratio     6 - 22 (calc) NOT APPLICABLE   Albumin MSPROF     3.6 - 5.1 g/dL 4.2  Globulin     1.9 - 3.7 g/dL (calc) 2.7   AG Ratio     1.0 - 2.5 (calc) 1.6   Alkaline phosphatase (APISO)     35 - 144 U/L 101    Pertinent Imaging: Results for orders placed or performed in visit on 04/26/22  BLADDER SCAN AMB NON-IMAGING  Result Value Ref Range   Scan Result 0 ml      Assessment & Plan:   Erectile dysfunction  -Did not respond to sildenail -  Discussed possible treatment options including PDE 5 inhibitors, vacuum erectile device, intracavernosal injection, MUSE, and placement of the inflatable or malleable penile prosthesis for refractory cases. - In terms of PDE 5 inhibitors, we discussed contraindications for this medication as well as common side effects. Patient was counseled on optimal use. All of his  questions were answered in detail. - He would like to try Cialis. Cialis. Prescribed, Advised to not take with nitroglycerin.   2. Incontinence  - Emptying adequately  - Could be a component of both stress and urge we discussed starting OAB medication vs. physical therapy. He would like to decrease his weight before deciding  Return in 6 months for [facility] and I[facility]  [facility] [address], [address] [place], [place] [address] [phone]  I,Kailey Littlejohn,acting as a scribe for [facility], PA-C.,have documented all relevant documentation on the behalf of Almee Pelphrey, PA-C,as directed by  [facility], PA-C while in the presence of Benisha Hadaway, PA-C.  I have reviewed the above documentation for accuracy and completeness, and I agree with the above.    Michiel Cowboy, PA-C

## 2022-04-27 ENCOUNTER — Ambulatory Visit: Payer: PPO | Admitting: Family Medicine

## 2022-04-27 DIAGNOSIS — M4692 Unspecified inflammatory spondylopathy, cervical region: Secondary | ICD-10-CM

## 2022-04-27 DIAGNOSIS — M12811 Other specific arthropathies, not elsewhere classified, right shoulder: Secondary | ICD-10-CM | POA: Diagnosis not present

## 2022-04-27 DIAGNOSIS — M5412 Radiculopathy, cervical region: Secondary | ICD-10-CM

## 2022-04-27 NOTE — Assessment & Plan Note (Signed)
Injection given today.  He does have underlying arthritic changes.  No crepitus noted.  Patient does have weakness of the rotator cuff.  Given injection today and had some improvement in range of motion.  Discussed which activities to do and which ones to avoid otherwise.  May need repeat injections when possible.  Do feel that some of the weakness could also be secondary to myelopathy of the neck we will need to wait for the nerve conduction study ordered by primary care provider.  Follow-up with me again in 6 to 8 weeks

## 2022-04-27 NOTE — Assessment & Plan Note (Signed)
Concern significantly for the myopathy.  The patient does have significant arthritic changes of multiple joints as well.  We discussed with patient about different treatment options.  The patient wants to avoid any type of surgical intervention.  We will avoid any type of injection but I do think that there is a possibility that that could be very helpful in the future.  Discussed icing regimen and home exercises otherwise.  Follow-up again in 6 to 8 weeks.

## 2022-04-27 NOTE — Patient Instructions (Signed)
Injection today If neck gets worse consider epidural Scapular exercises See me again in 6-8 weeks

## 2022-05-17 ENCOUNTER — Other Ambulatory Visit: Payer: Self-pay | Admitting: Internal Medicine

## 2022-05-17 MED ORDER — HYDROCODONE-ACETAMINOPHEN 10-325 MG PO TABS: 1.0000 | ORAL_TABLET | Freq: Three times a day (TID) | ORAL | 0 refills | Status: DC | PRN

## 2022-05-17 NOTE — Telephone Encounter (Signed)
Patient wife calling to request medication refill for HYDROcodone-acetaminophen (NORCO) 10-325 MG tablet.

## 2022-05-17 NOTE — Telephone Encounter (Signed)
Refilled: 03/19/2022 Last OV: 03/19/2022 Next OV: 06/16/2022

## 2022-05-19 ENCOUNTER — Ambulatory Visit (INDEPENDENT_AMBULATORY_CARE_PROVIDER_SITE_OTHER): Payer: PPO

## 2022-05-19 ENCOUNTER — Telehealth: Payer: Self-pay | Admitting: Cardiovascular Disease

## 2022-05-19 DIAGNOSIS — Z5181 Encounter for therapeutic drug level monitoring: Secondary | ICD-10-CM

## 2022-05-19 DIAGNOSIS — I4819 Other persistent atrial fibrillation: Secondary | ICD-10-CM

## 2022-05-19 DIAGNOSIS — Z86718 Personal history of other venous thrombosis and embolism: Secondary | ICD-10-CM | POA: Diagnosis not present

## 2022-05-19 LAB — POCT INR: INR: 1.6 — AB (ref 2.0–3.0)

## 2022-05-19 MED ORDER — AMIODARONE HCL 200 MG PO TABS: 200.0000 mg | ORAL_TABLET | Freq: Two times a day (BID) | ORAL | 3 refills | Status: DC

## 2022-05-19 NOTE — Telephone Encounter (Signed)
*  STAT* If patient is at the pharmacy, call can be transferred to refill team.   1. Which medications need to be refilled? (please list name of each medication and dose if known) Amiodarone 200mg  1tablet twice a day  2. Which pharmacy/location (including street and city if local pharmacy) is medication to be sent to? [facility]  3. Do they need a 30 day or 90 day supply? 90 day supply

## 2022-05-19 NOTE — Patient Instructions (Signed)
TAKE 2.5 TABLETS TODAY ONLY then start taking Warfarin 1 tablet every day EXCEPT 1.5 tablets on MONDAYS, WEDNESDAYS, FRIDAYS AND SATURDAYS.  - Recheck INR in 4 weeks TRY TO GET YOUR GREENS ON A SCHEDULE.  PICK A DAY (OR DAYS) AND HAVE THEM ON THE SAME DAY EACH WEEK

## 2022-05-19 NOTE — Telephone Encounter (Signed)
Requested Prescriptions   Signed Prescriptions Disp Refills   amiodarone (PACERONE) 200 MG tablet 180 tablet 3    Sig: Take 1 tablet (200 mg total) by mouth 2 (two) times daily.    Authorizing Provider: Antonieta Iba    Ordering User: Kendrick Fries

## 2022-06-09 ENCOUNTER — Telehealth: Payer: Self-pay | Admitting: Cardiovascular Disease

## 2022-06-09 ENCOUNTER — Other Ambulatory Visit: Payer: Self-pay

## 2022-06-09 DIAGNOSIS — I4819 Other persistent atrial fibrillation: Secondary | ICD-10-CM

## 2022-06-09 MED ORDER — WARFARIN SODIUM 5 MG PO TABS: ORAL_TABLET | ORAL | 3 refills | Status: DC

## 2022-06-09 MED ORDER — METOPROLOL SUCCINATE ER 25 MG PO TB24: ORAL_TABLET | ORAL | 2 refills | Status: DC

## 2022-06-09 NOTE — Progress Notes (Signed)
[name] [address] [place] [address] Phone: [phone] Subjective:   Johnathan [name], am serving as a scribe for Dr. Antoine [name].  I'm seeing this patient by the request  of:  Johnathan Shams, MD  CC: Neck and shoulder pain, myopathy follow-up, shoulder pain follow-up  LNL:[id number]  04/27/2022 Injection given today.  He does have underlying arthritic changes.  No crepitus noted.  Patient does have weakness of the rotator cuff.  Given injection today and had some improvement in range of motion.  Discussed which activities to do and which ones to avoid otherwise.  May need repeat injections when possible.  Do feel that some of the weakness could also be secondary to myelopathy of the neck we will need to wait for the nerve conduction study ordered by primary care provider.  Follow-up with me again in 6 to 8 weeks  Concern significantly for the myopathy.  The patient does have significant arthritic changes of multiple joints as well.  We discussed with patient about different treatment options.  The patient wants to avoid any type of surgical intervention.  We will avoid any type of injection but I do think that there is a possibility that that could be very helpful in the future.  Discussed icing regimen and home exercises otherwise.  Follow-up again in 6 to 8 weeks.  Update 06/16/2022 Johnathan [name] is a 66 y.o. male with complaint of neck and shoulder pain. Patient states that he is doing much better. Exercises have been helpful.        Past Medical History:  Diagnosis Date   Arthritis    a. chronic joint pain   B12 deficiency    Chronic systolic CHF (congestive heart failure) (HCC)    a. echo 2013: EF of 50-55%, normal right ventricular systolic pressure, normal left atrium; b. 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 there was no ST segment deviation noted during stress,    There is a small defect of moderate severity present in the apex location, suggestive of apical ischemia, intermediate risk, calculated EF 21% but visually appears to be 35-40%   Hypertension    Hypothyroidism    Neuropathy    Obesity    OSA (obstructive sleep apnea)    a. not compliant with CPAP   PAF (paroxysmal atrial fibrillation) (HCC)    a. CHADSVASc at least 2 (HTN and vascular disease); b. on Eliquis    Past Surgical History:  Procedure Laterality Date   ARTHROSCOPIC REPAIR ACL Bilateral 1993   bilateral knee x2 on left and x2 on right   BARIATRIC SURGERY  2004   Rou en Y    BARIATRIC SURGERY     CHOLECYSTECTOMY  1982   ELECTROPHYSIOLOGIC STUDY N/A 06/20/2015   Procedure: CARDIOVERSION;  Surgeon: Antonieta Iba, MD;  Location: [facility] ORS;  Service: Cardiovascular;  Laterality: N/A;   KNEE ARTHROPLASTY Right 06/20/2017   Procedure: COMPUTER ASSISTED TOTAL KNEE ARTHROPLASTY;  Surgeon: Donato Heinz, MD;  Location: [facility] ORS;  Service: Orthopedics;  Laterality: Right;   MENISCUS DEBRIDEMENT Left 2011   Left knee   REPLACEMENT TOTAL KNEE Left 07/2013   TONSILLECTOMY     Social History   Socioeconomic History  Marital status: Married    Spouse name: Not on file   Number of children: Not on file   Years of education: Not on file   Highest education level: Not on file  Occupational History   Not on file  Tobacco Use   Smoking status: Never    Passive exposure: Never   Smokeless tobacco: Never  Vaping Use   Vaping Use: Never used  Substance and Sexual Activity   Alcohol use: Yes    Alcohol/week: 2.0 standard drinks of alcohol    Types: 2 Shots of liquor per week    Comment: Vodka and lemonade occasionally 2-3/week    Drug use: No   Sexual activity: Yes    Partners: Female    Comment: Wife  Other Topics Concern   Not on file  Social  History Narrative   Currently between jobs   Lives with Wife   1 Son (33) and 1 daughter (75)    2 grandchildren by daughter    1 dog and 1 cat    Enjoys gardening, movies, dining out.    Social Determinants of Health   Financial Resource Strain: Low Risk  (02/22/2022)   Overall Financial Resource Strain (CARDIA)    Difficulty of Paying Living Expenses: Not hard at all  Food Insecurity: No Food Insecurity (02/22/2022)   Hunger Vital Sign    Worried About Running Out of Food in the Last Year: Never true    Ran Out of Food in the Last Year: Never true  Transportation Needs: No Transportation Needs (02/22/2022)   PRAPARE - Administrator, Civil Service (Medical): No    Lack of Transportation (Non-Medical): No  Physical Activity: Not on file  Stress: No Stress Concern Present (02/22/2022)   [employer] of Occupational Health - Occupational Stress Questionnaire    Feeling of Stress : Not at all  Social Connections: Unknown (02/22/2022)   Social Connection and Isolation Panel [NHANES]    Frequency of Communication with Friends and Family: Not on file    Frequency of Social Gatherings with Friends and Family: Not on file    Attends Religious Services: Not on file    Active Member of Clubs or Organizations: Not on file    Attends Banker Meetings: Not on file    Marital Status: Married   Allergies  Allergen Reactions   Eliquis [Apixaban] Itching    "Red spots" and severe itching   Xarelto [Rivaroxaban] Other (See Comments)    Reaction in 2013   Family History  Problem Relation Age of Onset   Dementia Mother    Diabetes Father    Alzheimer's disease Father     Current Outpatient Medications (Endocrine & Metabolic):    levothyroxine (SYNTHROID) 25 MCG tablet, Take 1 tablet (25 mcg total) by mouth daily before breakfast.  Current Outpatient Medications (Cardiovascular):    amiodarone (PACERONE) 200 MG tablet, Take 1 tablet (200 mg total) by mouth 2  (two) times daily.   furosemide (LASIX) 40 MG tablet, Take 1 tablet (40 mg total) by mouth daily as needed.   hydrochlorothiazide (HYDRODIURIL) 25 MG tablet, Take 1 tablet (25 mg total) by mouth daily.   losartan (COZAAR) 100 MG tablet, TAKE 1 TABLET BY MOUTH EVERY DAY   metoprolol succinate (TOPROL-XL) 25 MG 24 hr tablet, TAKE 1 TABLET BY MOUTH DAILY.   propranolol (INDERAL) 20 MG tablet, Take 1 tablet (20 mg total) by mouth 3 (three) times daily as needed. Reported on  03/22/2016   rosuvastatin (CRESTOR) 10 MG tablet, TAKE 1 TABLET (10 MG TOTAL) BY MOUTH DAILY AT 2 PM.   sildenafil (REVATIO) 20 MG tablet, Take 1 tablet (20 mg total) by mouth 3 (three) times daily.   tadalafil (CIALIS) 20 MG tablet, Take 1 tablet (20 mg total) by mouth daily as needed for erectile dysfunction.   Current Outpatient Medications (Analgesics):    HYDROcodone-acetaminophen (NORCO) 10-325 MG tablet, Take 1 tablet by mouth every 8 (eight) hours as needed.  Current Outpatient Medications (Hematological):    Ferrous Gluconate 324 (37.5 Fe) MG TABS, Take 1 tablet by mouth daily.    vitamin B-12 (CYANOCOBALAMIN) 500 MCG tablet, Take 500 mcg by mouth daily.   warfarin (COUMADIN) 5 MG tablet, TAKE 1 OR 2 TABLETS BY MOUTH EVERY DAY AS DIRECTED BY ANTI-COAG CLINIC  Current Outpatient Medications (Other):    B Complex Vitamins (B COMPLEX 50 PO), Take 1 tablet by mouth daily.    buPROPion (WELLBUTRIN XL) 300 MG 24 hr tablet, Take 1 tablet (300 mg total) by mouth daily.   Cholecalciferol (VITAMIN D3) 2000 UNITS TABS, Take 1 tablet by mouth daily.    docusate sodium (COLACE) 100 MG capsule, Take 400 mg by mouth daily.    gabapentin (NEURONTIN) 300 MG capsule, TAKE 1 CAPSULE BY MOUTH THREE TIMES A DAY   GLUCOSAMINE HCL-MSM PO, Take 1 tablet by mouth daily.    Multiple Vitamin (MULTIVITAMIN) tablet, Take 1 tablet by mouth daily.   Potassium Chloride ER 20 MEQ TBCR, Take 20 mEq by mouth 2 (two) times daily as needed.    venlafaxine XR (EFFEXOR-XR) 150 MG 24 hr capsule, Take 1 capsule (150 mg total) by mouth daily with breakfast.   vitamin E 400 UNIT capsule, Take 400 Units by mouth daily.   Reviewed prior external information including notes and imaging from  primary care provider As well as notes that were available from care everywhere and other healthcare systems.  Did review patient's MRI of the cervical spine in June of this year.  Did have significant central disc protrusion causing flattening of the thecal sac mostly at C6-C7 being the worst aspect.  Also seems to have more of a right-sided disc extrusion at C4-C5 significant arthritis at multiple levels.  Past medical history, social, surgical and family history all reviewed in electronic medical record.  No pertanent information unless stated regarding to the chief complaint.   Review of Systems:  No headache, visual changes, nausea, vomiting, diarrhea, constipation, dizziness, abdominal pain, skin rash, fevers, chills, night sweats, weight loss, swollen lymph nodes, body aches, joint swelling, chest pain, shortness of breath, mood changes. POSITIVE muscle aches  Objective  Blood pressure 138/78, pulse (!) 59, height 6\' 3"  (1.905 m), weight (!) 368 lb (166.9 kg), SpO2 96 %.   General: No apparent distress alert and oriented x3 mood and affect normal, dressed appropriately.  HEENT: Pupils equal, extraocular movements intact  Respiratory: Patient's speak in full sentences and does not appear short of breath  Cardiovascular: No lower extremity edema, non tender, no erythema  Severely antalgic gait patient uses the aid of a cane.  Patient does have neuropathy of the lower extremities.  Significant arthritic changes of the hands.  Patient's grip strength seems to be symmetric but 4 out of 5.  Patient does have some limited range of motion of the neck.  Possible mild improvement though with sidebending of the neck.    Impression and Recommendations:     The above  documentation has been reviewed and is accurate and complete Lyndal Pulley, DO

## 2022-06-09 NOTE — Telephone Encounter (Signed)
Per chart review does not look like either amiodarone or warfarin are new starts. Will send to pharmD to clarify further.

## 2022-06-09 NOTE — Telephone Encounter (Signed)
Pt c/o medication issue:  1. Name of Medication:   warfarin (COUMADIN) 5 MG tablet  amiodarone (PACERONE) 200 MG tablet  2. How are you currently taking this medication (dosage and times per day)?   3. Are you having a reaction (difficulty breathing--STAT)?   4. What is your medication issue?   Caller called to report that there is an interaction between these to medications and would like to confirm these medications.

## 2022-06-09 NOTE — Telephone Encounter (Signed)
*  STAT* If patient is at the pharmacy, call can be transferred to refill team.   1. Which medications need to be refilled? (please list name of each medication and dose if known) metoprolol succinate (TOPROL-XL) 25 MG 24 hr tablet  warfarin (COUMADIN) 5 MG tablet  2. Which pharmacy/location (including street and city if local pharmacy) is medication to be sent to? [id number] - [place], [place] - [address] [place] [address]  3. Do they need a 30 day or 90 day supply? 90

## 2022-06-09 NOTE — Telephone Encounter (Signed)
Ok to fill 

## 2022-06-09 NOTE — Telephone Encounter (Signed)
Requested Prescriptions   Signed Prescriptions Disp Refills   metoprolol succinate (TOPROL-XL) 25 MG 24 hr tablet 90 tablet 2    Sig: TAKE 1 TABLET BY MOUTH DAILY.    Authorizing Provider: Antonieta Iba    Ordering User: Thayer Headings, Nathaneal Sommers L   Please review refill request for warfarin. Thank you!

## 2022-06-10 NOTE — Telephone Encounter (Signed)
Called [facility] pharmacy in [place] and notified of PharmD recc below ok to refill.

## 2022-06-16 ENCOUNTER — Ambulatory Visit (INDEPENDENT_AMBULATORY_CARE_PROVIDER_SITE_OTHER): Payer: PPO | Admitting: Internal Medicine

## 2022-06-16 ENCOUNTER — Ambulatory Visit: Payer: PPO

## 2022-06-16 ENCOUNTER — Encounter: Payer: Self-pay | Admitting: Internal Medicine

## 2022-06-16 ENCOUNTER — Ambulatory Visit: Payer: PPO | Admitting: Family Medicine

## 2022-06-16 VITALS — BP 118/84 | HR 59 | Temp 98.4°F | Ht 75.0 in | Wt 359.4 lb

## 2022-06-16 DIAGNOSIS — M12811 Other specific arthropathies, not elsewhere classified, right shoulder: Secondary | ICD-10-CM

## 2022-06-16 DIAGNOSIS — M67911 Unspecified disorder of synovium and tendon, right shoulder: Secondary | ICD-10-CM

## 2022-06-16 DIAGNOSIS — M4692 Unspecified inflammatory spondylopathy, cervical region: Secondary | ICD-10-CM | POA: Diagnosis not present

## 2022-06-16 DIAGNOSIS — M5412 Radiculopathy, cervical region: Secondary | ICD-10-CM

## 2022-06-16 DIAGNOSIS — E039 Hypothyroidism, unspecified: Secondary | ICD-10-CM | POA: Diagnosis not present

## 2022-06-16 DIAGNOSIS — G4733 Obstructive sleep apnea (adult) (pediatric): Secondary | ICD-10-CM | POA: Diagnosis not present

## 2022-06-16 MED ORDER — HYDROCODONE-ACETAMINOPHEN 10-325 MG PO TABS: 1.0000 | ORAL_TABLET | Freq: Three times a day (TID) | ORAL | 0 refills | Status: DC | PRN

## 2022-06-16 NOTE — Assessment & Plan Note (Signed)
Weight has continued to drop with increased dose of 1.0 mg weekly  ozempic. His supply from [employer] ended and he has been without the medication for several months.  He lost 50 lbs with the medication . Forms were signed today to resume medication with assistance from [employer] .  Reminded that starting dose is 0.25 mg weekly x 4

## 2022-06-16 NOTE — Assessment & Plan Note (Signed)
Untreated due to patient intolerance to prior mask trials. He continues to report morning fatigue.   I have explained that this cannot be remedied until his OSA is treated

## 2022-06-16 NOTE — Patient Instructions (Addendum)
Gabapentin  refill on file at [facility]   Neurology referral  made to [place]   Plan on having labs at next visit ( 3 months)

## 2022-06-16 NOTE — Assessment & Plan Note (Signed)
Patient had injection last time.  Did not respond extremely well though to it.  We did discuss that cervical radiculopathy could be also contributing.  Does have imaging showing that there is a good chance for some nerve impingement.  Patient does not want any other big changes.  Can use the TENS unit, but discussed some of the neuropathy and the possibility of using different things such as a vibrating plate.  Follow-up again in 6 to 8 weeks otherwise.

## 2022-06-16 NOTE — Patient Instructions (Signed)
Oscillating, vibrating plate Consider injection of neck See me in 6-8 weeks

## 2022-06-16 NOTE — Assessment & Plan Note (Addendum)
Reviewed MRI ordered by Dr Katrinka Blazing for evaluation of persistent neck and left  shoulder pain and neck pain . He was referred to  Neurology for EMG/nerve conduction studies to rule out myelopathy affecting strength of right arm in June , but  the referral was not authorized for unclear reasons.  Repeating referral again

## 2022-06-16 NOTE — Assessment & Plan Note (Signed)
Thyroid function is WNL on current minimal dose.  No current changes needed.    Lab Results  Component Value Date   TSH 2.01 03/19/2022    

## 2022-06-16 NOTE — Progress Notes (Signed)
Subjective:  Patient ID: Johnathan [name], male    DOB: [birth year]  Age: 66 y.o. MRN: [id number]  CC: The primary encounter diagnosis was Cervical spondylitis with radiculitis (HCC). Diagnoses of Disorder of right rotator cuff, Acquired hypothyroidism, OSA (obstructive sleep apnea), and Morbid obesity (HCC) were also pertinent to this visit.   HPI Johnathan [name] presents for  Chief Complaint  Patient presents with   Follow-up    3 month follow up    1 obesity : anticipates resuming ozempic with assistance from pharmacy  previously he lost 50 lbs  before the medication supply was stopped .    2) cervical radiculitis:   reviewed MRI cervical spine ordered by Dr Katrinka Blazing.  He has right arm weakness which may be due in part to myelopathy.  Reviewed neurology referral from June , apparently it was denied.  repeat referral made,  this time to [facility]   3) chronic pain: knee,,  back,  multiple joints.  Using hydrocodone . Refill history confirmed via [place] Controlled Substance databas, accessed by me today.Marland Kitchen   4) atrial fib .  Managed with amidarone and metoprolol.  Anticoagulated with warfarin. Has had several  episodes of tachycardia managed with 40 mg propranolol  ,  resolved in 10 minutes  had a total of 2 in the last month occurred while being out in the heat and grocery    5) no recent falls     Outpatient Medications Prior to Visit  Medication Sig Dispense Refill   amiodarone (PACERONE) 200 MG tablet Take 1 tablet (200 mg total) by mouth 2 (two) times daily. 180 tablet 3   B Complex Vitamins (B COMPLEX 50 PO) Take 1 tablet by mouth daily.      buPROPion (WELLBUTRIN XL) 300 MG 24 hr tablet Take 1 tablet (300 mg total) by mouth daily. 90 tablet 3   Cholecalciferol (VITAMIN D3) 2000 UNITS TABS Take 1 tablet by mouth daily.      docusate sodium (COLACE) 100 MG capsule Take 400 mg by mouth daily.      Ferrous Gluconate 324 (37.5 Fe) MG TABS Take 1 tablet by mouth daily.      furosemide  (LASIX) 40 MG tablet Take 1 tablet (40 mg total) by mouth daily as needed. 90 tablet 3   gabapentin (NEURONTIN) 300 MG capsule TAKE 1 CAPSULE BY MOUTH THREE TIMES A DAY 270 capsule 3   GLUCOSAMINE HCL-MSM PO Take 1 tablet by mouth daily.      hydrochlorothiazide (HYDRODIURIL) 25 MG tablet Take 1 tablet (25 mg total) by mouth daily. 90 tablet 3   levothyroxine (SYNTHROID) 25 MCG tablet Take 1 tablet (25 mcg total) by mouth daily before breakfast. 90 tablet 3   losartan (COZAAR) 100 MG tablet TAKE 1 TABLET BY MOUTH EVERY DAY 90 tablet 3   metoprolol succinate (TOPROL-XL) 25 MG 24 hr tablet TAKE 1 TABLET BY MOUTH DAILY. 90 tablet 2   Multiple Vitamin (MULTIVITAMIN) tablet Take 1 tablet by mouth daily.     Potassium Chloride ER 20 MEQ TBCR Take 20 mEq by mouth 2 (two) times daily as needed. 180 tablet 3   propranolol (INDERAL) 20 MG tablet Take 1 tablet (20 mg total) by mouth 3 (three) times daily as needed. Reported on 03/22/2016 270 tablet 3   rosuvastatin (CRESTOR) 10 MG tablet TAKE 1 TABLET (10 MG TOTAL) BY MOUTH DAILY AT 2 PM. 90 tablet 3   sildenafil (REVATIO) 20 MG tablet Take 1 tablet (20  mg total) by mouth 3 (three) times daily. 90 tablet 1   tadalafil (CIALIS) 20 MG tablet Take 1 tablet (20 mg total) by mouth daily as needed for erectile dysfunction. 30 tablet 1   venlafaxine XR (EFFEXOR-XR) 150 MG 24 hr capsule Take 1 capsule (150 mg total) by mouth daily with breakfast. 90 capsule 0   vitamin B-12 (CYANOCOBALAMIN) 500 MCG tablet Take 500 mcg by mouth daily.     vitamin E 400 UNIT capsule Take 400 Units by mouth daily.     warfarin (COUMADIN) 5 MG tablet TAKE 1 OR 2 TABLETS BY MOUTH EVERY DAY AS DIRECTED BY ANTI-COAG CLINIC 90 tablet 3   HYDROcodone-acetaminophen (NORCO) 10-325 MG tablet Take 1 tablet by mouth every 8 (eight) hours as needed. 90 tablet 0   No facility-administered medications prior to visit.    Review of Systems;  Patient denies headache, fevers, malaise, unintentional  weight loss, skin rash, eye pain, sinus congestion and sinus pain, sore throat, dysphagia,  hemoptysis , cough, dyspnea, wheezing, chest pain, palpitations, orthopnea, edema, abdominal pain, nausea, melena, diarrhea, constipation, flank pain, dysuria, hematuria, urinary  Frequency, nocturia, numbness, tingling, seizures,  Focal weakness, Loss of consciousness,  Tremor, insomnia, depression, anxiety, and suicidal ideation.      Objective:  BP 118/84 (BP Location: Left Arm, Patient Position: Sitting, Cuff Size: Large)   Pulse (!) 59   Temp 98.4 F (36.9 C) (Oral)   Ht 6\' 3"  (1.905 m)   Wt (!) 359 lb 6.4 oz (163 kg)   SpO2 97%   BMI 44.92 kg/m   BP Readings from Last 3 Encounters:  06/16/22 118/84  06/16/22 138/78  04/27/22 128/82    Wt Readings from Last 3 Encounters:  06/16/22 (!) 359 lb 6.4 oz (163 kg)  06/16/22 (!) 368 lb (166.9 kg)  04/27/22 (!) 355 lb (161 kg)    General appearance: alert, morbidly obese, cooperative and appears stated age Ears: normal TM's and external ear canals both ears Throat: lips, mucosa, and tongue normal; teeth and gums normal Neck: no adenopathy, no carotid bruit, supple, symmetrical, trachea midline and thyroid not enlarged, symmetric, no tenderness/mass/nodules Back: symmetric, no curvature. ROM normal. No CVA tenderness. Lungs: clear to auscultation bilaterally Heart: regular rate and rhythm, S1, S2 normal, no murmur, click, rub or gallop Abdomen: soft, non-tender; bowel sounds normal; no masses,  no organomegaly Pulses: 2+ and symmetric Skin: Skin color, texture, turgor normal. No rashes or lesions Lymph nodes: Cervical, supraclavicular, and axillary nodes normal.  Lab Results  Component Value Date   HGBA1C 5.4 03/19/2022   HGBA1C 5.2 12/15/2021   HGBA1C 5.9 03/11/2021    Lab Results  Component Value Date   CREATININE 0.95 03/19/2022   CREATININE 1.03 10/16/2021   CREATININE 1.13 03/11/2021    Lab Results  Component Value Date    WBC 9.6 03/19/2022   HGB 14.3 03/19/2022   HCT 42.9 03/19/2022   PLT 178 03/19/2022   GLUCOSE 83 03/19/2022   CHOL 118 03/19/2022   TRIG 140 03/19/2022   HDL 48 03/19/2022   LDLCALC 48 03/19/2022   ALT 36 03/19/2022   AST 23 03/19/2022   NA 140 03/19/2022   K 4.1 03/19/2022   CL 106 03/19/2022   CREATININE 0.95 03/19/2022   BUN 23 03/19/2022   CO2 24 03/19/2022   TSH 2.01 03/19/2022   PSA 0.68 10/16/2021   INR 1.6 (A) 05/19/2022   HGBA1C 5.4 03/19/2022    No results found.  Assessment &  Plan:   Problem List Items Addressed This Visit     Acquired hypothyroidism    Thyroid function is WNL on current minimal dose.  No current changes needed.    Lab Results  Component Value Date   TSH 2.01 03/19/2022         Cervical spondylitis with radiculitis (HCC) - Primary    Reviewed MRI ordered by Dr Katrinka Blazing for evaluation of persistent neck and left  shoulder pain and neck pain . He was referred to  Neurology for EMG/nerve conduction studies to rule out myelopathy affecting strength of right arm in June , but  the referral was not authorized for unclear reasons.  Repeating referral again       Relevant Medications   HYDROcodone-acetaminophen (NORCO) 10-325 MG tablet   HYDROcodone-acetaminophen (NORCO) 10-325 MG tablet   Other Relevant Orders   Ambulatory referral to Neurology   Morbid obesity (HCC)    Weight has continued to drop with increased dose of 1.0 mg weekly  ozempic. His supply from [employer] ended and he has been without the medication for several months.  He lost 50 lbs with the medication . Forms were signed today to resume medication with assistance from [employer] .  Reminded that starting dose is 0.25 mg weekly x 4       OSA (obstructive sleep apnea)    Untreated due to patient intolerance to prior mask trials. He continues to report morning fatigue.   I have explained that this cannot be remedied until his OSA is treated      Other Visit Diagnoses      Disorder of right rotator cuff       Relevant Orders   Ambulatory referral to Neurology       I spent a total of 30 minutes with this patient in a face to face visit on the date of this encounter reviewing the last office visit with me  in June,  most recent with patient's cardiologist  Dr Mariah Milling,  his sports medicine visit today, patient's diet and eating habits,  most recent imaging study ,   and post visit ordering of testing and therapeutics.    Follow-up: No follow-ups on file.   Sherlene Shams, MD

## 2022-06-23 ENCOUNTER — Ambulatory Visit: Payer: PPO | Attending: Internal Medicine

## 2022-06-23 DIAGNOSIS — I4819 Other persistent atrial fibrillation: Secondary | ICD-10-CM | POA: Diagnosis not present

## 2022-06-23 DIAGNOSIS — Z5181 Encounter for therapeutic drug level monitoring: Secondary | ICD-10-CM

## 2022-06-23 DIAGNOSIS — Z86718 Personal history of other venous thrombosis and embolism: Secondary | ICD-10-CM

## 2022-06-23 LAB — POCT INR: INR: 2 (ref 2.0–3.0)

## 2022-06-23 NOTE — Patient Instructions (Signed)
Continue Warfarin 1 tablet every day EXCEPT 1.5 tablets on MONDAYS, WEDNESDAYS, FRIDAYS AND SATURDAYS.  - Recheck INR in 6 weeks TRY TO GET YOUR GREENS ON A SCHEDULE.  PICK A DAY (OR DAYS) AND HAVE THEM ON THE SAME DAY EACH WEEK 

## 2022-07-08 ENCOUNTER — Encounter: Payer: Self-pay | Admitting: Internal Medicine

## 2022-07-09 ENCOUNTER — Encounter: Payer: Self-pay | Admitting: Internal Medicine

## 2022-07-12 ENCOUNTER — Other Ambulatory Visit: Payer: Self-pay | Admitting: Internal Medicine

## 2022-07-12 ENCOUNTER — Telehealth: Payer: Self-pay | Admitting: Internal Medicine

## 2022-07-12 MED ORDER — VENLAFAXINE HCL ER 150 MG PO CP24: 150.0000 mg | ORAL_CAPSULE | Freq: Every day | ORAL | 1 refills | Status: DC

## 2022-07-12 NOTE — Telephone Encounter (Signed)
Looking at the Neurology referral that was placed it states that they do not see pt's with the diagnosis associated with the referral.

## 2022-07-12 NOTE — Telephone Encounter (Signed)
Pt spouse called stating pt need a refill on [facility]ine sent to [facility] and pt would like an update on an neurology referral

## 2022-07-13 NOTE — Telephone Encounter (Signed)
LMTCB with [facility]

## 2022-07-15 NOTE — Telephone Encounter (Signed)
Spoke with pt to let him know that we have received the Ozempic and it is ready for pick up.

## 2022-07-23 ENCOUNTER — Telehealth: Payer: Self-pay

## 2022-07-23 MED ORDER — HYDROCODONE-ACETAMINOPHEN 5-325 MG PO TABS: 2.0000 | ORAL_TABLET | Freq: Four times a day (QID) | ORAL | 0 refills | Status: DC | PRN

## 2022-07-23 NOTE — Telephone Encounter (Signed)
Pts wife is aware 

## 2022-07-23 NOTE — Telephone Encounter (Signed)
Patient's wife, Hermilo Dutter, called to state patient is out of his HYDROcodone-acetaminophen ([place]) 10-325 MG tablet, and has been out since 07/18/2022.  Curt Bears states patient is in so much pain, he can barely walk.  Curt Bears states pharmacy has been unable to get the 10 MG tablets but they do have the 5 MG tablets.  Curt Bears would like to know if we can send in a prescription for for the 5 MG tablets.  Curt Bears states patient's preferred pharmacy is [facility] in [place].

## 2022-08-04 ENCOUNTER — Ambulatory Visit: Payer: PPO | Attending: Cardiovascular Disease

## 2022-08-04 DIAGNOSIS — Z86718 Personal history of other venous thrombosis and embolism: Secondary | ICD-10-CM | POA: Diagnosis not present

## 2022-08-04 DIAGNOSIS — Z5181 Encounter for therapeutic drug level monitoring: Secondary | ICD-10-CM

## 2022-08-04 DIAGNOSIS — I4819 Other persistent atrial fibrillation: Secondary | ICD-10-CM

## 2022-08-04 LAB — POCT INR: INR: 2.3 (ref 2.0–3.0)

## 2022-08-04 NOTE — Patient Instructions (Signed)
Continue Warfarin 1 tablet every day EXCEPT 1.5 tablets on MONDAYS, WEDNESDAYS, FRIDAYS AND SATURDAYS.  - Recheck INR in 6 weeks TRY TO GET YOUR GREENS ON A SCHEDULE.  PICK A DAY (OR DAYS) AND HAVE THEM ON THE SAME DAY EACH WEEK

## 2022-08-18 ENCOUNTER — Ambulatory Visit: Payer: PPO | Admitting: Family Medicine

## 2022-09-09 ENCOUNTER — Other Ambulatory Visit: Payer: Self-pay | Admitting: Internal Medicine

## 2022-09-14 NOTE — Progress Notes (Signed)
[name] [place] Phone: [phone] Subjective:   Johnathan [name], am serving as a scribe for Dr. Hulan Saas.  I'm seeing this patient by the request  of:  Crecencio Mc, MD  CC: Arthritis, right shoulder injury  [id number]  06/16/2022 Patient had injection last time.  Did not respond extremely well though to it.  We did discuss that cervical radiculopathy could be also contributing.  Does have imaging showing that there is a good chance for some nerve impingement.  Patient does not want any other big changes.  Can use the TENS unit, but discussed some of the neuropathy and the possibility of using different things such as a vibrating plate.  Follow-up again in 6 to 8 weeks otherwise.     Update 09/16/2022 Johnathan [name] is a 66 y.o. male coming in with complaint of R shoulder pain. Patient states is doing really bad, wife states he fell on it twice. Patient thinks he may have tore something. Patient states does not notice weakness in the arm but when he moves it the front of the right shoulder is very painful. Patient denies numbness and tingling. Patient fell on the right shoulder, one time was in the chicken coop and then the other one he tripped over a stake from the christmas lights. Patient has notices some left elbow pain as well. Patient has ben using ben gay at night and during the day he has his Vicodin that he takes daily already and nightitme will take some ibuprofen to sleep. Ice did not help.     Past Medical History:  Diagnosis Date   Arthritis    a. chronic joint pain   B12 deficiency    Chronic systolic CHF (congestive heart failure) ([place])    a. echo 2013: EF of 50-55%, normal right ventricular systolic pressure, normal left atrium; b. 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 there was no ST segment deviation noted during stress,    There is a small defect of moderate severity present in the apex location, suggestive of apical ischemia, intermediate risk, calculated EF 21% but visually appears to be 35-40%   Hypertension    Hypothyroidism    Neuropathy    Obesity    OSA (obstructive sleep apnea)    a. not compliant with CPAP   PAF (paroxysmal atrial fibrillation) ([place])    a. CHADSVASc at least 2 (HTN and vascular disease); b. on Eliquis    Past Surgical History:  Procedure Laterality Date   ARTHROSCOPIC REPAIR ACL Bilateral 1993   bilateral knee x2 on left and x2 on right   BARIATRIC SURGERY  2004   [place]    [place] N/A 06/20/2015   Procedure: CARDIOVERSION;  Surgeon: Minna Merritts, MD;  Location: [facility] ORS;  Service: Cardiovascular;  Laterality: N/A;   KNEE ARTHROPLASTY Right 06/20/2017   Procedure: COMPUTER ASSISTED TOTAL KNEE ARTHROPLASTY;  Surgeon: Dereck Leep, MD;  Location: [facility] ORS;  Service: Orthopedics;  Laterality: Right;   MENISCUS DEBRIDEMENT Left 2011   Left knee   REPLACEMENT TOTAL KNEE Left 07/2013   TONSILLECTOMY  Social History   Socioeconomic History   Marital status: Married    Spouse name: Not on file   Number of children: Not on file   Years of education: Not on file   Highest education level: Not on file  Occupational History   Not on file  Tobacco Use   Smoking status: Never    Passive exposure: Never   Smokeless tobacco: Never  Vaping Use   Vaping Use: Never used  Substance and Sexual Activity   Alcohol use: Yes    Alcohol/week: 2.0 standard drinks of alcohol    Types: 2 Shots of liquor per week    Comment: Vodka and lemonade occasionally 2-3/week    Drug use: No   Sexual activity: Yes    Partners: Female    Comment: Wife  Other Topics Concern   Not on file  Social  History Narrative   Currently between jobs   Lives with Wife   1 Son (33) and 1 daughter (39)    2 grandchildren by daughter    1 dog and 1 cat    Enjoys gardening, movies, dining out.    Social Determinants of Health   Financial Resource Strain: Low Risk  (02/22/2022)   Overall Financial Resource Strain (CARDIA)    Difficulty of Paying Living Expenses: Not hard at all  Food Insecurity: No Food Insecurity (02/22/2022)   Hunger Vital Sign    Worried About Running Out of Food in the Last Year: Never true    Ran Out of Food in the Last Year: Never true  Transportation Needs: No Transportation Needs (02/22/2022)   PRAPARE - Administrator, Civil Service (Medical): No    Lack of Transportation (Non-Medical): No  Physical Activity: Not on file  Stress: No Stress Concern Present (02/22/2022)   [employer] of Occupational Health - Occupational Stress Questionnaire    Feeling of Stress : Not at all  Social Connections: Unknown (02/22/2022)   Social Connection and Isolation Panel [NHANES]    Frequency of Communication with Friends and Family: Not on file    Frequency of Social Gatherings with Friends and Family: Not on file    Attends Religious Services: Not on file    Active Member of Clubs or Organizations: Not on file    Attends Banker Meetings: Not on file    Marital Status: Married   Allergies  Allergen Reactions   Eliquis [Apixaban] Itching    "Red spots" and severe itching   Xarelto [Rivaroxaban] Other (See Comments)    Reaction in 2013   Family History  Problem Relation Age of Onset   Dementia Mother    Diabetes Father    Alzheimer's disease Father     Current Outpatient Medications (Endocrine & Metabolic):    levothyroxine (SYNTHROID) 25 MCG tablet, Take 1 tablet (25 mcg total) by mouth daily before breakfast.  Current Outpatient Medications (Cardiovascular):    amiodarone (PACERONE) 200 MG tablet, Take 1 tablet (200 mg total) by mouth 2  (two) times daily.   furosemide (LASIX) 40 MG tablet, Take 1 tablet (40 mg total) by mouth daily as needed.   hydrochlorothiazide (HYDRODIURIL) 25 MG tablet, Take 1 tablet (25 mg total) by mouth daily.   losartan (COZAAR) 100 MG tablet, Take 1 tablet by mouth once daily   metoprolol succinate (TOPROL-XL) 50 MG 24 hr tablet, TAKE 1 TABLET BY MOUTH DAILY.   propranolol (INDERAL) 20 MG tablet, Take 1 tablet (20 mg total) by mouth  3 (three) times daily as needed. Reported on 03/22/2016   rosuvastatin (CRESTOR) 10 MG tablet, TAKE 1 TABLET (10 MG TOTAL) BY MOUTH DAILY AT 2 PM.   sildenafil (REVATIO) 20 MG tablet, Take 1 tablet (20 mg total) by mouth 3 (three) times daily.   tadalafil (CIALIS) 20 MG tablet, Take 1 tablet (20 mg total) by mouth daily as needed for erectile dysfunction.   Current Outpatient Medications (Analgesics):    HYDROcodone-acetaminophen (NORCO) 10-325 MG tablet, Take 1 tablet by mouth every 8 (eight) hours as needed.   HYDROcodone-acetaminophen (NORCO) 10-325 MG tablet, Take 1 tablet by mouth every 8 (eight) hours as needed.   HYDROcodone-acetaminophen (NORCO/VICODIN) 5-325 MG tablet, Take 2 tablets by mouth every 6 (six) hours as needed.  Current Outpatient Medications (Hematological):    Ferrous Gluconate 324 (37.5 Fe) MG TABS, Take 1 tablet by mouth daily.    vitamin B-12 (CYANOCOBALAMIN) 500 MCG tablet, Take 500 mcg by mouth daily.   warfarin (COUMADIN) 5 MG tablet, TAKE 1 OR 2 TABLETS BY MOUTH EVERY DAY AS DIRECTED BY ANTI-COAG CLINIC  Current Outpatient Medications (Other):    B Complex Vitamins (B COMPLEX 50 PO), Take 1 tablet by mouth daily.    buPROPion (WELLBUTRIN XL) 300 MG 24 hr tablet, Take 1 tablet (300 mg total) by mouth daily.   Cholecalciferol (VITAMIN D3) 2000 UNITS TABS, Take 1 tablet by mouth daily.    docusate sodium (COLACE) 100 MG capsule, Take 400 mg by mouth daily.    gabapentin (NEURONTIN) 300 MG capsule, TAKE 1 CAPSULE BY MOUTH THREE TIMES A DAY    GLUCOSAMINE HCL-MSM PO, Take 1 tablet by mouth daily.    Multiple Vitamin (MULTIVITAMIN) tablet, Take 1 tablet by mouth daily.   Potassium Chloride ER 20 MEQ TBCR, Take 20 mEq by mouth 2 (two) times daily as needed.   venlafaxine XR (EFFEXOR-XR) 150 MG 24 hr capsule, Take 1 capsule (150 mg total) by mouth daily with breakfast.   vitamin E 400 UNIT capsule, Take 400 Units by mouth daily.   Reviewed prior external information including notes and imaging from  primary care provider As well as notes that were available from care everywhere and other healthcare systems.  Past medical history, social, surgical and family history all reviewed in electronic medical record.  No pertanent information unless stated regarding to the chief complaint.   Review of Systems:  No headache, visual changes, nausea, vomiting, diarrhea, constipation, dizziness, abdominal pain, skin rash, fevers, chills, night sweats, weight loss, swollen lymph nodes,  chest pain, shortness of breath, mood changes. POSITIVE muscle aches, body aches, joint swelling  Objective  Blood pressure 110/80, pulse (!) 107, height 6\' 3"  (1.905 m), weight (!) 356 lb (161.5 kg), SpO2 94 %.   General: No apparent distress alert and oriented x3 mood and affect normal, dressed appropriately.  HEENT: Pupils equal, extraocular movements intact  . Antalgic at the moment.  Patient does have difficulty with ambulation.  Seems to have difficulty with numbness of the lower extremities at the moment.  Patient's right shoulder has significant weakness noted.  Only 5 degrees of external range of motion.  Patient is tender and sore with some mild swelling noted over the deltoid it appears.  Neurovascular intact distally but significant arthritic changes of the hand right greater than left.  Neck exam does have limited range of motion as well.    Impression and Recommendations:     The above documentation has been reviewed and is accurate  and complete  Lyndal Pulley, DO

## 2022-09-15 ENCOUNTER — Ambulatory Visit (INDEPENDENT_AMBULATORY_CARE_PROVIDER_SITE_OTHER): Payer: PPO | Admitting: Internal Medicine

## 2022-09-15 ENCOUNTER — Encounter: Payer: Self-pay | Admitting: Internal Medicine

## 2022-09-15 ENCOUNTER — Ambulatory Visit: Payer: PPO | Attending: Cardiovascular Disease

## 2022-09-15 VITALS — BP 128/92 | HR 105 | Temp 98.1°F | Ht 75.0 in | Wt 360.0 lb

## 2022-09-15 DIAGNOSIS — E7849 Other hyperlipidemia: Secondary | ICD-10-CM | POA: Diagnosis not present

## 2022-09-15 DIAGNOSIS — I10 Essential (primary) hypertension: Secondary | ICD-10-CM | POA: Diagnosis not present

## 2022-09-15 DIAGNOSIS — R600 Localized edema: Secondary | ICD-10-CM

## 2022-09-15 DIAGNOSIS — I4819 Other persistent atrial fibrillation: Secondary | ICD-10-CM | POA: Diagnosis not present

## 2022-09-15 DIAGNOSIS — R002 Palpitations: Secondary | ICD-10-CM | POA: Diagnosis not present

## 2022-09-15 DIAGNOSIS — E039 Hypothyroidism, unspecified: Secondary | ICD-10-CM

## 2022-09-15 DIAGNOSIS — Z5181 Encounter for therapeutic drug level monitoring: Secondary | ICD-10-CM

## 2022-09-15 DIAGNOSIS — R7303 Prediabetes: Secondary | ICD-10-CM | POA: Diagnosis not present

## 2022-09-15 DIAGNOSIS — I4719 Other supraventricular tachycardia: Secondary | ICD-10-CM

## 2022-09-15 DIAGNOSIS — D6869 Other thrombophilia: Secondary | ICD-10-CM

## 2022-09-15 DIAGNOSIS — R2689 Other abnormalities of gait and mobility: Secondary | ICD-10-CM

## 2022-09-15 DIAGNOSIS — M5412 Radiculopathy, cervical region: Secondary | ICD-10-CM

## 2022-09-15 DIAGNOSIS — Z86718 Personal history of other venous thrombosis and embolism: Secondary | ICD-10-CM | POA: Diagnosis not present

## 2022-09-15 DIAGNOSIS — R296 Repeated falls: Secondary | ICD-10-CM

## 2022-09-15 DIAGNOSIS — M4692 Unspecified inflammatory spondylopathy, cervical region: Secondary | ICD-10-CM

## 2022-09-15 LAB — POCT INR: INR: 4.7 — AB (ref 2.0–3.0)

## 2022-09-15 MED ORDER — HYDROCODONE-ACETAMINOPHEN 10-325 MG PO TABS: 1.0000 | ORAL_TABLET | Freq: Three times a day (TID) | ORAL | 0 refills | Status: DC | PRN

## 2022-09-15 MED ORDER — FUROSEMIDE 40 MG PO TABS: 40.0000 mg | ORAL_TABLET | Freq: Every day | ORAL | 0 refills | Status: DC | PRN

## 2022-09-15 MED ORDER — METOPROLOL SUCCINATE ER 50 MG PO TB24
ORAL_TABLET | ORAL | 1 refills | Status: DC
Start: 2022-09-15 — End: 2023-03-28

## 2022-09-15 NOTE — Patient Instructions (Addendum)
Resume lasix 40 mg daily  for now .  Take a potassium supplement daily  Increase metoprolol dose to 50 mg daily to slow heart rate and improve BP  Return for repeat blood test in 2 weeks  Physical Therapy referral in progress  Dr Windell Hummingbird office will call you  Neurology referral is likely a bust  we will start over

## 2022-09-15 NOTE — Progress Notes (Addendum)
Subjective:  Patient ID: Ladona Mow, male    DOB: 06-13-1956  Age: 66 y.o. MRN: [id number]  CC: The primary encounter diagnosis was Heart palpitations. Diagnoses of Essential hypertension, Acquired hypothyroidism, Other hyperlipidemia, Prediabetes, Frequent falls, Balance problem, Acquired thrombophilia ([place]), Bilateral leg edema, Morbid obesity ([place]), Cervical spondylitis with radiculitis ([place]), and Other supraventricular tachycardia were also pertinent to this visit.   HPI KALUM MINNER presents for  Chief Complaint  Patient presents with   Follow-up    3 month follow up    1) Atrial fibrillation:  has been in NSR since cardioversion,  maintained on amiodarone. For the past month  has felt dizzy, short of breath with short walks  . Sleeps in a recliner and denies a need to change position.  Sleeping more during the day,    2) Neuropathy:  frequent falls reported by Curt Bears (patient denies and minimizes the frequency).    Was using the pool at the [facility] 3 times weekly  this summer but now the pool is closed for repairs   discussed PT referral   3) right  foot and lower leg  "red "   (not red,  has 3 + pitting edema. Lymphedema changes to toes.   4) shoulder pain on right from fall.  Seeing Smith tomorrow    Outpatient Medications Prior to Visit  Medication Sig Dispense Refill   amiodarone (PACERONE) 200 MG tablet Take 1 tablet (200 mg total) by mouth 2 (two) times daily. 180 tablet 3   B Complex Vitamins (B COMPLEX 50 PO) Take 1 tablet by mouth daily.      buPROPion (WELLBUTRIN XL) 300 MG 24 hr tablet Take 1 tablet (300 mg total) by mouth daily. 90 tablet 3   Cholecalciferol (VITAMIN D3) 2000 UNITS TABS Take 1 tablet by mouth daily.      docusate sodium (COLACE) 100 MG capsule Take 400 mg by mouth daily.      Ferrous Gluconate 324 (37.5 Fe) MG TABS Take 1 tablet by mouth daily.      gabapentin (NEURONTIN) 300 MG capsule TAKE 1 CAPSULE BY MOUTH THREE TIMES A DAY  270 capsule 3   GLUCOSAMINE HCL-MSM PO Take 1 tablet by mouth daily.      hydrochlorothiazide (HYDRODIURIL) 25 MG tablet Take 1 tablet (25 mg total) by mouth daily. 90 tablet 3   HYDROcodone-acetaminophen (NORCO) 10-325 MG tablet Take 1 tablet by mouth every 8 (eight) hours as needed. 90 tablet 0   HYDROcodone-acetaminophen (NORCO/VICODIN) 5-325 MG tablet Take 2 tablets by mouth every 6 (six) hours as needed. 180 tablet 0   levothyroxine (SYNTHROID) 25 MCG tablet Take 1 tablet (25 mcg total) by mouth daily before breakfast. 90 tablet 3   losartan (COZAAR) 100 MG tablet Take 1 tablet by mouth once daily 90 tablet 0   Multiple Vitamin (MULTIVITAMIN) tablet Take 1 tablet by mouth daily.     Potassium Chloride ER 20 MEQ TBCR Take 20 mEq by mouth 2 (two) times daily as needed. 180 tablet 3   propranolol (INDERAL) 20 MG tablet Take 1 tablet (20 mg total) by mouth 3 (three) times daily as needed. Reported on 03/22/2016 270 tablet 3   rosuvastatin (CRESTOR) 10 MG tablet TAKE 1 TABLET (10 MG TOTAL) BY MOUTH DAILY AT 2 PM. 90 tablet 3   sildenafil (REVATIO) 20 MG tablet Take 1 tablet (20 mg total) by mouth 3 (three) times daily. 90 tablet 1   tadalafil (CIALIS) 20 MG  tablet Take 1 tablet (20 mg total) by mouth daily as needed for erectile dysfunction. 30 tablet 1   venlafaxine XR (EFFEXOR-XR) 150 MG 24 hr capsule Take 1 capsule (150 mg total) by mouth daily with breakfast. 90 capsule 1   vitamin B-12 (CYANOCOBALAMIN) 500 MCG tablet Take 500 mcg by mouth daily.     vitamin E 400 UNIT capsule Take 400 Units by mouth daily.     warfarin (COUMADIN) 5 MG tablet TAKE 1 OR 2 TABLETS BY MOUTH EVERY DAY AS DIRECTED BY ANTI-COAG CLINIC 90 tablet 3   furosemide (LASIX) 40 MG tablet Take 1 tablet (40 mg total) by mouth daily as needed. 90 tablet 3   HYDROcodone-acetaminophen (NORCO) 10-325 MG tablet Take 1 tablet by mouth every 8 (eight) hours as needed. 90 tablet 0   metoprolol succinate (TOPROL-XL) 25 MG 24 hr tablet  TAKE 1 TABLET BY MOUTH DAILY. 90 tablet 2   No facility-administered medications prior to visit.    Review of Systems;  Patient denies headache, fevers, malaise, unintentional weight loss, skin rash, eye pain, sinus congestion and sinus pain, sore throat, dysphagia,  hemoptysis , cough, dyspnea, wheezing, chest pain, palpitations, orthopnea, edema, abdominal pain, nausea, melena, diarrhea, constipation, flank pain, dysuria, hematuria, urinary  Frequency, nocturia, numbness, tingling, seizures,  Focal weakness, Loss of consciousness,  Tremor, insomnia, depression, anxiety, and suicidal ideation.      Objective:  BP (!) 128/92   Pulse (!) 105   Temp 98.1 F (36.7 C) (Oral)   Ht 6' 3" (1.905 m)   Wt (!) 360 lb (163.3 kg)   SpO2 96%   BMI 45.00 kg/m   BP Readings from Last 3 Encounters:  09/15/22 (!) 128/92  06/16/22 118/84  06/16/22 138/78    Wt Readings from Last 3 Encounters:  09/15/22 (!) 360 lb (163.3 kg)  06/16/22 (!) 359 lb 6.4 oz (163 kg)  06/16/22 (!) 368 lb (166.9 kg)    General appearance: alert, cooperative and appears stated age Ears: normal TM's and external ear canals both ears Throat: lips, mucosa, and tongue normal; teeth and gums normal Neck: no adenopathy, no carotid bruit, supple, symmetrical, trachea midline and thyroid not enlarged, symmetric, no tenderness/mass/nodules Back: symmetric, no curvature. ROM normal. No CVA tenderness. Lungs: clear to auscultation bilaterally Heart: regular rate and rhythm, S1, S2 normal, no murmur, click, rub or gallop Abdomen: soft, non-tender; bowel sounds normal; no masses,  no organomegaly Pulses: 2+ and symmetric Skin: Skin color, texture, turgor normal. No rashes or lesions Lymph nodes: Cervical, supraclavicular, and axillary nodes normal. Neuro:  awake and interactive with normal mood and affect. Higher cortical functions are normal. Speech is clear without word-finding difficulty or dysarthria. Extraocular movements  are intact. Visual fields of both eyes are grossly intact. Sensation to light touch is grossly intact bilaterally of upper and lower extremities. Motor examination shows 4+/5 symmetric hand grip and upper extremity and 5/5 lower extremity strength. There is no pronation or drift. Gait is non-ataxic   Lab Results  Component Value Date   HGBA1C 5.4 03/19/2022   HGBA1C 5.2 12/15/2021   HGBA1C 5.9 03/11/2021    Lab Results  Component Value Date   CREATININE 0.95 03/19/2022   CREATININE 1.03 10/16/2021   CREATININE 1.13 03/11/2021    Lab Results  Component Value Date   WBC 9.6 03/19/2022   HGB 14.3 03/19/2022   HCT 42.9 03/19/2022   PLT 178 03/19/2022   GLUCOSE 83 03/19/2022   CHOL 118 03/19/2022  TRIG 140 03/19/2022   HDL 48 03/19/2022   LDLCALC 48 03/19/2022   ALT 36 03/19/2022   AST 23 03/19/2022   NA 140 03/19/2022   K 4.1 03/19/2022   CL 106 03/19/2022   CREATININE 0.95 03/19/2022   BUN 23 03/19/2022   CO2 24 03/19/2022   TSH 2.01 03/19/2022   PSA 0.68 10/16/2021   INR 4.7 (A) 09/15/2022   HGBA1C 5.4 03/19/2022    No results found.  Assessment & Plan:   Problem List Items Addressed This Visit     Acquired hypothyroidism    Thyroid function is WNL on current minimal dose.  No current changes needed.    Lab Results  Component Value Date   TSH 2.01 03/19/2022        Relevant Medications   metoprolol succinate (TOPROL-XL) 50 MG 24 hr tablet   Other Relevant Orders   TSH   Acquired thrombophilia ([place])    Managed with warfarin  for embolic stroke risk mitigation due to  atrial fibrillation. Patient has no signs of bleeding  in spite of multiple falls and is advised to notify his specialists prior to any procedure that may required suspension of warfarin . He has  regular follow up at the INR clinic and recent INR is elevated  he has been advised to contact his cardiologist to discuss a dose adjustment        Arrhythmia    I have ordered and reviewed a 12  lead EKG and find that he may be in atrial fibrillation again.  I have increased his metoprolol to lower his HR and contacted his cardiologist for follow up         Relevant Medications   furosemide (LASIX) 40 MG tablet   metoprolol succinate (TOPROL-XL) 50 MG 24 hr tablet   Bilateral leg edema    Current changes in right leg suggest development of lymphedema,  aggravated of course by atrial fibrillation, morbid obesity and untreated sleep apnea. Will resume furosemde 40 mg daily and refer to vascular surgery if not tolerated       Cervical spondylitis with radiculitis (HCC)    Given the changes noted on recent  MRI ordered by Dr Tamala Julian for evaluation of persistent neck and left  shoulder pain and neck pain , I am concerned that his frequent falls are due in part to myelopathy , . I have been trying to obtain a  Neurology consult for 6 months for EMG/nerve conduction studies to rule out myelopathy  , but  the referral was not authorized for unclear reasons.  Repeating referral again       Relevant Medications   HYDROcodone-acetaminophen (NORCO) 10-325 MG tablet   Essential hypertension   Relevant Medications   furosemide (LASIX) 40 MG tablet   metoprolol succinate (TOPROL-XL) 50 MG 24 hr tablet   Other Relevant Orders   Comp Met (CMET)   Urine Microalbumin w/creat. ratio   Frequent falls    Repeated attempts to refer to neurology for EMG testing have been thus far  unsuccessful.  Screening for reversible causes of neuropathy have been done.  Referring to PT for balance and strength training       Relevant Orders   Ambulatory referral to Physical Therapy   Hyperlipidemia   Relevant Medications   furosemide (LASIX) 40 MG tablet   metoprolol succinate (TOPROL-XL) 50 MG 24 hr tablet   Other Relevant Orders   Lipid Profile   Direct LDL   Morbid obesity (  [place])    Weight loss was achieved with Ozempic which was obtained through  [employer] and has ended   He lost 50 lbs with the  medication .pharmacy consult needed       Relevant Orders   AMB Referral to Pharmacy Medication Management   Prediabetes   Relevant Orders   HgB A1c   Other Visit Diagnoses     Heart palpitations    -  Primary   Relevant Orders   EKG 12-Lead (Completed)   Balance problem       Relevant Orders   Ambulatory referral to Physical Therapy       I spent a total of 40 minutes with this patient in  addition to the time taken to read an EKG in a face to face visit on the date of this encounter reviewing the last office visit with me, his most recent visit with cardiology, patient's diet and exercise habits, home  weights,  and post visit ordering of testing and therapeutics.    Follow-up: Return in about 3 months (around 12/15/2022).   Crecencio Mc, MD

## 2022-09-15 NOTE — Patient Instructions (Signed)
HOLD TODAY AND THURSDAY THEN Continue Warfarin 1 tablet every day EXCEPT 1.5 tablets on MONDAYS, WEDNESDAYS, FRIDAYS AND SATURDAYS.  - Recheck INR in 3 weeks TRY TO GET YOUR GREENS ON A SCHEDULE.  PICK A DAY (OR DAYS) AND HAVE THEM ON THE SAME DAY EACH WEEK

## 2022-09-16 ENCOUNTER — Ambulatory Visit (INDEPENDENT_AMBULATORY_CARE_PROVIDER_SITE_OTHER): Payer: PPO

## 2022-09-16 ENCOUNTER — Ambulatory Visit: Payer: PPO | Admitting: Family Medicine

## 2022-09-16 ENCOUNTER — Encounter: Payer: Self-pay | Admitting: Family Medicine

## 2022-09-16 ENCOUNTER — Telehealth: Payer: Self-pay | Admitting: Internal Medicine

## 2022-09-16 VITALS — BP 110/80 | HR 107 | Ht 75.0 in | Wt 356.0 lb

## 2022-09-16 DIAGNOSIS — M25531 Pain in right wrist: Secondary | ICD-10-CM | POA: Diagnosis not present

## 2022-09-16 DIAGNOSIS — M25511 Pain in right shoulder: Secondary | ICD-10-CM

## 2022-09-16 DIAGNOSIS — M12811 Other specific arthropathies, not elsewhere classified, right shoulder: Secondary | ICD-10-CM

## 2022-09-16 DIAGNOSIS — I499 Cardiac arrhythmia, unspecified: Secondary | ICD-10-CM | POA: Insufficient documentation

## 2022-09-16 DIAGNOSIS — G629 Polyneuropathy, unspecified: Secondary | ICD-10-CM | POA: Diagnosis not present

## 2022-09-16 LAB — LIPID PANEL
Cholesterol: 111 mg/dL (ref 0–200)
HDL: 41.7 mg/dL (ref >39.00)
LDL Cholesterol: 36 mg/dL (ref 0–99)
NonHDL: 69.62
Total CHOL/HDL Ratio: 3
Triglycerides: 169 mg/dL — ABNORMAL HIGH (ref 0.0–149.0)
VLDL: 33.8 mg/dL (ref 0.0–40.0)

## 2022-09-16 LAB — COMPREHENSIVE METABOLIC PANEL
ALT: 24 U/L (ref 0–53)
AST: 19 U/L (ref 0–37)
Albumin: 4 g/dL (ref 3.5–5.2)
Alkaline Phosphatase: 91 U/L (ref 39–117)
BUN: 19 mg/dL (ref 6–23)
CO2: 25 mEq/L (ref 19–32)
Calcium: 8 mg/dL — ABNORMAL LOW (ref 8.4–10.5)
Chloride: 106 mEq/L (ref 96–112)
Creatinine, Ser: 1.13 mg/dL (ref 0.40–1.50)
GFR: 67.43 mL/min (ref >60.00)
Glucose, Bld: 76 mg/dL (ref 70–99)
Potassium: 4.1 mEq/L (ref 3.5–5.1)
Sodium: 141 mEq/L (ref 135–145)
Total Bilirubin: 0.4 mg/dL (ref 0.2–1.2)
Total Protein: 6.6 g/dL (ref 6.0–8.3)

## 2022-09-16 LAB — MICROALBUMIN / CREATININE URINE RATIO
Creatinine,U: 104.1 mg/dL
Microalb Creat Ratio: 2.9 mg/g (ref 0.0–30.0)
Microalb, Ur: 3 mg/dL — ABNORMAL HIGH (ref 0.0–1.9)

## 2022-09-16 LAB — HEMOGLOBIN A1C: Hgb A1c MFr Bld: 5.8 % (ref 4.6–6.5)

## 2022-09-16 LAB — TSH: TSH: 6.04 u[IU]/mL — ABNORMAL HIGH (ref 0.35–5.50)

## 2022-09-16 LAB — LDL CHOLESTEROL, DIRECT: Direct LDL: 54 mg/dL

## 2022-09-16 NOTE — Assessment & Plan Note (Signed)
Thyroid function is WNL on current minimal dose.  No current changes needed.    Lab Results  Component Value Date   TSH 2.01 03/19/2022

## 2022-09-16 NOTE — Assessment & Plan Note (Signed)
Weight loss was achieved with Ozempic which was obtained through  [employer] and has ended   He lost 50 lbs with the medication .pharmacy consult needed

## 2022-09-16 NOTE — Assessment & Plan Note (Signed)
Patient's neuropathy seems to be worsening and do need to know if this is more secondary to the lumbar radiculopathy and see if it something we can change over the possibility of the peripheral neuropathy.  Patient will be referred for a nerve conduction test of the bilateral lower extremities and we will also see if we can expedite the referral process to neurology.

## 2022-09-16 NOTE — Assessment & Plan Note (Signed)
Managed with warfarin  for embolic stroke risk mitigation due to  atrial fibrillation. Patient has no signs of bleeding  in spite of multiple falls and is advised to notify his specialists prior to any procedure that may required suspension of warfarin . He has  regular follow up at the INR clinic and recent INR is elevated  he has been advised to contact his cardiologist to discuss a dose adjustment

## 2022-09-16 NOTE — Telephone Encounter (Signed)
Left msg for patient to come in today -per Dr. Darrick Huntsman and EKG performed in her office possible afib). Dr. Okey Dupre DOD slot this afternoon or 4:20 slot

## 2022-09-16 NOTE — Assessment & Plan Note (Signed)
I have ordered and reviewed a 12 lead EKG and find that he may be in atrial fibrillation again.  I have increased his metoprolol to lower his HR and contacted his cardiologist for follow up

## 2022-09-16 NOTE — Assessment & Plan Note (Signed)
Given the changes noted on recent  MRI ordered by Dr Katrinka Blazing for evaluation of persistent neck and left  shoulder pain and neck pain , I am concerned that his frequent falls are due in part to myelopathy , . I have been trying to obtain a  Neurology consult for 6 months for EMG/nerve conduction studies to rule out myelopathy  , but  the referral was not authorized for unclear reasons.  Repeating referral again

## 2022-09-16 NOTE — Assessment & Plan Note (Signed)
Repeated attempts to refer to neurology for EMG testing have been thus far  unsuccessful.  Screening for reversible causes of neuropathy have been done.  Referring to PT for balance and strength training

## 2022-09-16 NOTE — Assessment & Plan Note (Signed)
Current changes in right leg suggest development of lymphedema,  aggravated of course by atrial fibrillation, morbid obesity and untreated sleep apnea. Will resume furosemde 40 mg daily and refer to vascular surgery if not tolerated

## 2022-09-16 NOTE — Patient Instructions (Addendum)
Good to see you  Referral placed for neurology for nerve conduction study  X rays done today  No lifting more then a coffee cup Follow up in 6 weeks

## 2022-09-16 NOTE — Assessment & Plan Note (Signed)
Known severe rotator cuff arthropathy and does appear that patient may have actually if there is some avascular necrosis of the possibility of a small nondisplaced fracture noted.  Discussed with patient at great length.  Patient will limit any type of lifting mechanics over the next 4 weeks.  We did discuss the possibility of injecting which patient though would like to hold which I think is a good idea and reevaluate in 4 weeks.

## 2022-09-17 ENCOUNTER — Other Ambulatory Visit: Payer: Self-pay | Admitting: Internal Medicine

## 2022-09-20 NOTE — Progress Notes (Unsigned)
Cardiology Office Note  Date:  09/21/2022   ID:  Johnathan [name], DOB 20-Apr-1956, MRN [id number]  PCP:  Sherlene Shams, MD   Chief Complaint  Patient presents with   Abnormal ECG    Ref by Dr. Darrick Huntsman for possible A-Fib with symptoms of shortness of breath with little to no exeriton, dizziness, weakness and rapid irregular heart beats. Medications reviewed by the patient verbally.     HPI:  [name] is a pleasant 66 year old gentleman with  Morbid obesity chronic joint pain of the shoulders and knees on long-term low-dose narcotics,  hypertension,   bariatric surgery,  atrial fibrillation in August 2013,   normal ejection fraction at that time  sleep apnea, currently not on his CPAP DVt on right LE Echo with EF 40 to 45% in 2016 who presents for follow-up of his atrial fibrillation.   Last seen by myself in clinic June 2023  Recent EKG performed with Dr. Darrick Huntsman, concern for arrhythmia Metoprolol dosing was increased  Recent weight loss 50 pounds on Ozempic Back on Lasix for leg swelling  Main issues discussed today" Chronic fatigue, Trouble walking long distance "Broken shoulder", followed by orthopedics Recent falls, [employer] bathroom among other falls with orthopedic injuries Walks with a cane  He does not feel that he has shortness of breath but his wife is concerned about his shortness of breath on exertion No longer doing his water aerobics as he used to in the past  EKG personally reviewed by myself on todays visit NSR rate 61 bpm no St or Twave changes   total knee replacement b/l 2019  No regular exercise program  EKG personally reviewed by myself on todays visit Shows normal sinus rhythm rate 63 bpm no significant ST-T wave changes  Other past medical history reviewed Echo 2016  - Left ventricle: The cavity size was normal. There was mild   concentric hypertrophy. Systolic function was mildly to   moderately reduced. The estimated ejection fraction  was in the   range of 40% to 45%. Images were inadequate for LV wall motion   assessment. - Mitral valve: There was mild regurgitation. - Left atrium: The atrium was severely dilated. - Pulmonary arteries: Systolic pressure was within the normal   range.   previous cardioversion,successful in restoring normal sinus rhythm    tick bite in June 2016, he developed atrial fibrillation initially with RVR. Started on digoxin, continued on metoprolol and diltiazem with improved heart rate. Echocardiogram and stress test at that time Cardiac exam shows slightly depressed ejection fraction, dilated left atrium, mild LVH. Ejection fraction estimated at 40-45%. Stress test with small region of apical perfusion defect, depressed ejection fraction.    PMH:   has a past medical history of Arthritis, B12 deficiency, Chronic systolic CHF (congestive heart failure) (HCC), Erythema migrans (Lyme disease) (04/08/2015), History of DVT (deep vein thrombosis) (2004), History of stress test, Hypertension, Hypothyroidism, Neuropathy, Obesity, OSA (obstructive sleep apnea), and PAF (paroxysmal atrial fibrillation) (HCC).  PSH:    Past Surgical History:  Procedure Laterality Date   ARTHROSCOPIC REPAIR ACL Bilateral 1993   bilateral knee x2 on left and x2 on right   BARIATRIC SURGERY  2004   [place]    BARIATRIC SURGERY     CHOLECYSTECTOMY  1982   ELECTROPHYSIOLOGIC STUDY N/A 06/20/2015   Procedure: CARDIOVERSION;  Surgeon: Antonieta Iba, MD;  Location: [facility] ORS;  Service: Cardiovascular;  Laterality: N/A;   KNEE ARTHROPLASTY Right 06/20/2017   Procedure:  COMPUTER ASSISTED TOTAL KNEE ARTHROPLASTY;  Surgeon: Donato Heinz, MD;  Location: [facility] ORS;  Service: Orthopedics;  Laterality: Right;   MENISCUS DEBRIDEMENT Left 2011   Left knee   REPLACEMENT TOTAL KNEE Left 07/2013   TONSILLECTOMY     Current Outpatient Medications on File Prior to Visit  Medication Sig Dispense Refill   amiodarone (PACERONE) 200  MG tablet Take 1 tablet (200 mg total) by mouth 2 (two) times daily. 180 tablet 3   B Complex Vitamins (B COMPLEX 50 PO) Take 1 tablet by mouth daily.      buPROPion (WELLBUTRIN XL) 300 MG 24 hr tablet Take 1 tablet (300 mg total) by mouth daily. 90 tablet 3   Cholecalciferol (VITAMIN D3) 2000 UNITS TABS Take 1 tablet by mouth daily.      docusate sodium (COLACE) 100 MG capsule Take 400 mg by mouth daily.      Ferrous Gluconate 324 (37.5 Fe) MG TABS Take 1 tablet by mouth daily.      furosemide (LASIX) 40 MG tablet Take 1 tablet (40 mg total) by mouth daily as needed. 90 tablet 0   gabapentin (NEURONTIN) 300 MG capsule TAKE 1 CAPSULE BY MOUTH THREE TIMES A DAY 270 capsule 3   GLUCOSAMINE HCL-MSM PO Take 1 tablet by mouth daily.      hydrochlorothiazide (HYDRODIURIL) 25 MG tablet Take 1 tablet (25 mg total) by mouth daily. 90 tablet 3   HYDROcodone-acetaminophen (NORCO) 10-325 MG tablet Take 1 tablet by mouth every 8 (eight) hours as needed. 90 tablet 0   levothyroxine (SYNTHROID) 25 MCG tablet Take 1 tablet (25 mcg total) by mouth daily before breakfast. 90 tablet 3   losartan (COZAAR) 100 MG tablet Take 1 tablet by mouth once daily 90 tablet 0   metoprolol succinate (TOPROL-XL) 50 MG 24 hr tablet TAKE 1 TABLET BY MOUTH DAILY. 90 tablet 1   Multiple Vitamin (MULTIVITAMIN) tablet Take 1 tablet by mouth daily.     Potassium Chloride ER 20 MEQ TBCR Take 20 mEq by mouth 2 (two) times daily as needed. 180 tablet 3   propranolol (INDERAL) 20 MG tablet Take 1 tablet (20 mg total) by mouth 3 (three) times daily as needed. Reported on 03/22/2016 270 tablet 3   rosuvastatin (CRESTOR) 10 MG tablet TAKE 1 TABLET (10 MG TOTAL) BY MOUTH DAILY AT 2 PM. 90 tablet 3   sildenafil (REVATIO) 20 MG tablet Take 1 tablet (20 mg total) by mouth 3 (three) times daily. 90 tablet 1   tadalafil (CIALIS) 20 MG tablet Take 1 tablet (20 mg total) by mouth daily as needed for erectile dysfunction. 30 tablet 1   venlafaxine XR  (EFFEXOR-XR) 150 MG 24 hr capsule Take 1 capsule (150 mg total) by mouth daily with breakfast. 90 capsule 1   vitamin B-12 (CYANOCOBALAMIN) 500 MCG tablet Take 500 mcg by mouth daily.     vitamin E 400 UNIT capsule Take 400 Units by mouth daily.     warfarin (COUMADIN) 5 MG tablet TAKE 1 OR 2 TABLETS BY MOUTH EVERY DAY AS DIRECTED BY ANTI-COAG CLINIC 90 tablet 3   HYDROcodone-acetaminophen (NORCO) 10-325 MG tablet Take 1 tablet by mouth every 8 (eight) hours as needed. (Patient not taking: Reported on 09/21/2022) 90 tablet 0   HYDROcodone-acetaminophen (NORCO/VICODIN) 5-325 MG tablet Take 2 tablets by mouth every 6 (six) hours as needed. (Patient not taking: Reported on 09/21/2022) 180 tablet 0   No current facility-administered medications on file prior to visit.  Allergies:   Eliquis [apixaban] and Xarelto [rivaroxaban]   Social History:  The patient  reports that he has never smoked. He has never been exposed to tobacco smoke. He has never used smokeless tobacco. He reports current alcohol use of about 2.0 standard drinks of alcohol per week. He reports that he does not use drugs.   Family History:   family history includes Alzheimer's disease in his father; Dementia in his mother; Diabetes in his father.    Review of Systems: Review of Systems  Constitutional: Negative.   Respiratory:  Positive for shortness of breath.   Cardiovascular:  Positive for leg swelling.  Gastrointestinal: Negative.   Musculoskeletal:  Positive for falls and joint pain.       Gait instability  Neurological: Negative.   Psychiatric/Behavioral: Negative.    All other systems reviewed and are negative.   PHYSICAL EXAM: VS:  BP 120/70 (BP Location: Left Arm, Patient Position: Sitting, Cuff Size: Large)   Pulse 61   Ht 6\' 3"  (1.905 m)   Wt (!) 358 lb 6 oz (162.6 kg)   SpO2 94%   BMI 44.79 kg/m  , BMI Body mass index is 44.79 kg/m. Constitutional:  oriented to person, place, and time. No distress.   HENT:  Head: Grossly normal Eyes:  no discharge. No scleral icterus.  Neck: No JVD, no carotid bruits  Cardiovascular: Regular rate and rhythm, no murmurs appreciated Trace edema right lower extremity Pulmonary/Chest: Clear to auscultation bilaterally, no wheezes or rails Abdominal: Soft.  no distension.  no tenderness.  Musculoskeletal: Normal range of motion Neurological:  normal muscle tone. Coordination normal. No atrophy Skin: Skin warm and dry Psychiatric: normal affect, pleasant  Recent Labs: 03/19/2022: Hemoglobin 14.3; Platelets 178 09/15/2022: ALT 24; BUN 19; Creatinine, Ser 1.13; Potassium 4.1; Sodium 141; TSH 6.04    Lipid Panel Lab Results  Component Value Date   CHOL 111 09/15/2022   HDL 41.70 09/15/2022   LDLCALC 36 09/15/2022   TRIG 169.0 (H) 09/15/2022      Wt Readings from Last 3 Encounters:  09/21/22 (!) 358 lb 6 oz (162.6 kg)  09/16/22 (!) 356 lb (161.5 kg)  09/15/22 (!) 360 lb (163.3 kg)     ASSESSMENT AND PLAN:  Atrial fibrillation, unspecified type (HCC) - Plan: EKG 12-Lead Normal sinus rhythm on today's visit Recommend he continue amiodarone, higher dose metoprolol, warfarin Unable to exclude paroxysmal arrhythmia, Zio monitor ordered  Essential hypertension - Plan: EKG 12-Lead Blood pressure is well controlled on today's visit. No changes made to the medications.  Acute on chronic diastolic and systolic CHF (congestive heart failure) (HCC)  trace lower extremity edema right lower extremity, also had DVT on that side Lab work stable, continue Lasix for now He is declining repeat echocardiogram  Severe obesity (BMI >= 40) (HCC) Carbohydrate restriction Unable to exercise secondary to gait instability, debility  Chronic joint pain, neck pain Unable to exercise secondary to chronic knee pain  On pain medication for pain control   Total encounter time more than 40 minutes  Greater than 50% was spent in counseling and coordination of care  with the patient    No orders of the defined types were placed in this encounter.    Signed, 14/06/23, M.D., Ph.D. 09/21/2022  [facility] [place], [place]

## 2022-09-21 ENCOUNTER — Ambulatory Visit (INDEPENDENT_AMBULATORY_CARE_PROVIDER_SITE_OTHER): Payer: PPO

## 2022-09-21 ENCOUNTER — Ambulatory Visit: Payer: PPO | Attending: Cardiovascular Disease | Admitting: Cardiovascular Disease

## 2022-09-21 ENCOUNTER — Encounter: Payer: Self-pay | Admitting: Cardiovascular Disease

## 2022-09-21 VITALS — BP 120/70 | HR 61 | Ht 75.0 in | Wt 358.4 lb

## 2022-09-21 DIAGNOSIS — I4819 Other persistent atrial fibrillation: Secondary | ICD-10-CM

## 2022-09-21 DIAGNOSIS — E7849 Other hyperlipidemia: Secondary | ICD-10-CM

## 2022-09-21 DIAGNOSIS — I5043 Acute on chronic combined systolic (congestive) and diastolic (congestive) heart failure: Secondary | ICD-10-CM

## 2022-09-21 DIAGNOSIS — Z86718 Personal history of other venous thrombosis and embolism: Secondary | ICD-10-CM | POA: Diagnosis not present

## 2022-09-21 DIAGNOSIS — I872 Venous insufficiency (chronic) (peripheral): Secondary | ICD-10-CM | POA: Diagnosis not present

## 2022-09-21 DIAGNOSIS — I420 Dilated cardiomyopathy: Secondary | ICD-10-CM

## 2022-09-21 DIAGNOSIS — I10 Essential (primary) hypertension: Secondary | ICD-10-CM | POA: Diagnosis not present

## 2022-09-21 MED ORDER — POTASSIUM CHLORIDE ER 20 MEQ PO TBCR: 20.0000 meq | EXTENDED_RELEASE_TABLET | Freq: Two times a day (BID) | ORAL | 3 refills | Status: DC | PRN

## 2022-09-21 MED ORDER — PROPRANOLOL HCL 20 MG PO TABS: 20.0000 mg | ORAL_TABLET | Freq: Three times a day (TID) | ORAL | 3 refills | Status: DC | PRN

## 2022-09-21 NOTE — Telephone Encounter (Signed)
MyChart messgae sent to patient. 

## 2022-09-21 NOTE — Patient Instructions (Addendum)
Medication Instructions:  No changes  If you need a refill on your cardiac medications before your next appointment, please call your pharmacy.   Lab work: No new labs needed  Testing/Procedures:  Your physician has recommended that you wear a Zio monitor for Atrial Fibrillation. Please wear monitor for 14 days.  This monitor is a medical device that records the heart's electrical activity. Doctors most often use these monitors to diagnose arrhythmias. Arrhythmias are problems with the speed or rhythm of the heartbeat. The monitor is a small device applied to your chest. You can wear one while you do your normal daily activities. While wearing this monitor if you have any symptoms to push the button and record what you felt. Once you have worn this monitor for the period of time provider prescribed (Usually 14 days), you will return the monitor device in the postage paid box. Once it is returned they will download the data collected and provide [place] with a report which the provider will then review and we will call you with those results. Important tips:  Avoid showering during the first 24 hours of wearing the monitor. Avoid excessive sweating to help maximize wear time. Do not submerge the device, no hot tubs, and no swimming pools. Keep any lotions or oils away from the patch. After 24 hours you may shower with the patch on. Take brief showers with your back facing the shower head.  Do not remove patch once it has been placed because that will interrupt data and decrease adhesive wear time. Push the button when you have any symptoms and write down what you were feeling. Once you have completed wearing your monitor, remove and place into box which has postage paid and place in your outgoing mailbox.  If for some reason you have misplaced your box then call our office and we can provide another box and/or mail it off for you.   Follow-Up: At [facility], you and your health needs are our  priority.  As part of our continuing mission to provide you with exceptional heart care, we have created designated Provider Care Teams.  These Care Teams include your primary Cardiologist (physician) and Advanced Practice Providers (APPs -  Physician Assistants and Nurse Practitioners) who all work together to provide you with the care you need, when you need it.  You will need a follow up appointment in 6 months  Providers on your designated Care Team:   Nicolasa Ducking, NP Eula Listen, PA-C Cadence Fransico [name], [place]  COVID-19 Vaccine Information can be found at: [email or web address] For questions related to vaccine distribution or appointments, please email [email or web address] or call [phone].

## 2022-09-22 NOTE — Telephone Encounter (Signed)
MyChart messgae sent to patient. 

## 2022-09-22 NOTE — Telephone Encounter (Signed)
Error

## 2022-09-23 ENCOUNTER — Other Ambulatory Visit: Payer: Self-pay

## 2022-09-23 ENCOUNTER — Encounter: Payer: Self-pay | Admitting: Family Medicine

## 2022-09-23 MED ORDER — GABAPENTIN 300 MG PO CAPS: ORAL_CAPSULE | ORAL | 3 refills | Status: DC

## 2022-09-28 ENCOUNTER — Encounter: Payer: Self-pay | Admitting: Internal Medicine

## 2022-10-03 DIAGNOSIS — I4819 Other persistent atrial fibrillation: Secondary | ICD-10-CM

## 2022-10-06 ENCOUNTER — Ambulatory Visit: Payer: PPO

## 2022-10-13 ENCOUNTER — Ambulatory Visit: Payer: PPO | Attending: Cardiovascular Disease

## 2022-10-13 DIAGNOSIS — Z5181 Encounter for therapeutic drug level monitoring: Secondary | ICD-10-CM

## 2022-10-13 DIAGNOSIS — I4819 Other persistent atrial fibrillation: Secondary | ICD-10-CM

## 2022-10-13 DIAGNOSIS — Z86718 Personal history of other venous thrombosis and embolism: Secondary | ICD-10-CM

## 2022-10-13 LAB — POCT INR: INR: 2.4 (ref 2.0–3.0)

## 2022-10-13 NOTE — Patient Instructions (Signed)
Continue Warfarin 1 tablet every day EXCEPT 1.5 tablets on MONDAYS, WEDNESDAYS, FRIDAYS.  - Recheck INR in 6 weeks TRY TO GET YOUR GREENS ON A SCHEDULE.  PICK A DAY (OR DAYS) AND HAVE THEM ON THE SAME DAY EACH WEEK

## 2022-10-22 NOTE — Progress Notes (Unsigned)
04/26/22 2:05 PM   Johnathan [name] [birth year] [id number]  Referring provider:  Sherlene Shams, MD [address] [place],  [place] [address]  Urological history: ED  - Contributing factors morbid obesity, diabetes, chronic pain on narcotics, heart disease, blood pressure medications, antidepressants. - SHIM *** -Tadalafil 20 mg on-demand dosing   HPI: Johnathan [name] is a 67 y.o.male who returns today for a 6 month follow up.     Score:  1-7 Mild 8-19 Moderate 20-35 Severe      Score: 1-7 Severe ED 8-11 Moderate ED 12-16 Mild-Moderate ED 17-21 Mild ED 22-25 No ED    PMH: Past Medical History:  Diagnosis Date   Arthritis    a. chronic joint pain   B12 deficiency    Chronic systolic CHF (congestive heart failure) (HCC)    a. echo 2013: EF of 50-55%, normal right ventricular systolic pressure, normal left atrium; b. EF 40-45%, inadequate for LV wall motion, mild MR, LA severely dilated @ 52 mm, PASP nl   Erythema migrans (Lyme disease) 04/08/2015   History of DVT (deep vein thrombosis) 2004   s/p bariatric surgery   History of stress test    a. there was no ST segment deviation noted during stress,    There is a small defect of moderate severity present in the apex location, suggestive of apical ischemia, intermediate risk, calculated EF 21% but visually appears to be 35-40%   Hypertension    Hypothyroidism    Neuropathy    Obesity    OSA (obstructive sleep apnea)    a. not compliant with CPAP   PAF (paroxysmal atrial fibrillation) (HCC)    a. CHADSVASc at least 2 (HTN and vascular disease); b. on Eliquis     Surgical History: Past Surgical History:  Procedure Laterality Date   ARTHROSCOPIC REPAIR ACL Bilateral 1993   bilateral knee x2 on left and x2 on right   BARIATRIC SURGERY  2004   Rou en Y    BARIATRIC SURGERY     CHOLECYSTECTOMY  1982   ELECTROPHYSIOLOGIC STUDY N/A 06/20/2015   Procedure: CARDIOVERSION;  Surgeon: Antonieta Iba,  MD;  Location: [facility] ORS;  Service: Cardiovascular;  Laterality: N/A;   KNEE ARTHROPLASTY Right 06/20/2017   Procedure: COMPUTER ASSISTED TOTAL KNEE ARTHROPLASTY;  Surgeon: Donato Heinz, MD;  Location: [facility] ORS;  Service: Orthopedics;  Laterality: Right;   MENISCUS DEBRIDEMENT Left 2011   Left knee   REPLACEMENT TOTAL KNEE Left 07/2013   TONSILLECTOMY      Home Medications:  Allergies as of 04/26/2022       Reactions   Eliquis [apixaban] Itching   "Red spots" and severe itching   Xarelto [rivaroxaban] Other (See Comments)   Reaction in 2013        Medication List        Accurate as of April 26, 2022  2:05 PM. If you have any questions, ask your nurse or doctor.          STOP taking these medications    nitroGLYCERIN 0.4 MG SL tablet Commonly known as: NITROSTAT Stopped by: Jermani Eberlein, PA-C       TAKE these medications    amiodarone 200 MG tablet Commonly known as: PACERONE Take 1 tablet (200 mg total) by mouth 2 (two) times daily.   B COMPLEX 50 PO Take 1 tablet by mouth daily.   buPROPion 300 MG 24 hr tablet Commonly known as: WELLBUTRIN XL Take 1 tablet (300  mg total) by mouth daily.   docusate sodium 100 MG capsule Commonly known as: COLACE Take 400 mg by mouth daily.   Ferrous Gluconate 324 (37.5 Fe) MG Tabs Take 1 tablet by mouth daily.   furosemide 40 MG tablet Commonly known as: LASIX Take 1 tablet (40 mg total) by mouth daily as needed.   gabapentin 300 MG capsule Commonly known as: NEURONTIN TAKE 1 CAPSULE BY MOUTH THREE TIMES A DAY   GLUCOSAMINE HCL-MSM PO Take 1 tablet by mouth daily.   hydrochlorothiazide 25 MG tablet Commonly known as: HYDRODIURIL Take 1 tablet (25 mg total) by mouth daily.   HYDROcodone-acetaminophen 10-325 MG tablet Commonly known as: NORCO Take 1 tablet by mouth every 8 (eight) hours as needed.   levothyroxine 25 MCG tablet Commonly known as: SYNTHROID Take 1 tablet (25 mcg total) by mouth daily  before breakfast.   losartan 100 MG tablet Commonly known as: COZAAR TAKE 1 TABLET BY MOUTH EVERY DAY   metoprolol succinate 25 MG 24 hr tablet Commonly known as: TOPROL-XL TAKE 1 TABLET BY MOUTH DAILY.   multivitamin tablet Take 1 tablet by mouth daily.   Potassium Chloride ER 20 MEQ Tbcr Take 20 mEq by mouth 2 (two) times daily as needed.   propranolol 20 MG tablet Commonly known as: INDERAL Take 1 tablet (20 mg total) by mouth 3 (three) times daily as needed. Reported on 03/22/2016   rosuvastatin 10 MG tablet Commonly known as: CRESTOR TAKE 1 TABLET (10 MG TOTAL) BY MOUTH DAILY AT 2 PM.   sildenafil 20 MG tablet Commonly known as: REVATIO Take 1 tablet (20 mg total) by mouth 3 (three) times daily.   tadalafil 20 MG tablet Commonly known as: CIALIS Take 1 tablet (20 mg total) by mouth daily as needed for erectile dysfunction. Started by: Zara Council, PA-C   venlafaxine XR 150 MG 24 hr capsule Commonly known as: EFFEXOR-XR Take 1 capsule (150 mg total) by mouth daily with breakfast.   vitamin B-12 500 MCG tablet Commonly known as: CYANOCOBALAMIN Take 500 mcg by mouth daily.   Vitamin D3 50 MCG (2000 UT) Tabs Take 1 tablet by mouth daily.   vitamin E 180 MG (400 UNITS) capsule Take 400 Units by mouth daily.   warfarin 5 MG tablet Commonly known as: COUMADIN Take as directed by the anticoagulation clinic. If you are unsure how to take this medication, talk to your nurse or doctor. Original instructions: TAKE 1 OR 2 TABLETS BY MOUTH EVERY DAY AS DIRECTED BY ANTI-COAG CLINIC        Allergies:  Allergies  Allergen Reactions   Eliquis [Apixaban] Itching    "Red spots" and severe itching   Xarelto [Rivaroxaban] Other (See Comments)    Reaction in 2013    Family History: Family History  Problem Relation Age of Onset   Dementia Mother    Diabetes Father    Alzheimer's disease Father     Social History:  reports that he has never smoked. He has never  been exposed to tobacco smoke. He has never used smokeless tobacco. He reports current alcohol use of about 2.0 standard drinks of alcohol per week. He reports that he does not use drugs.   Physical Exam: BP (!) 152/83   Pulse 69   Ht 6\' 3"  (1.905 m)   Wt (!) 350 lb (158.8 kg)   BMI 43.75 kg/m   Constitutional:  Well nourished. Alert and oriented, No acute distress. HEENT: [place] AT, moist mucus membranes.  Trachea midline Cardiovascular: No clubbing, cyanosis, or edema. Respiratory: Normal respiratory effort, no increased work of breathing. GU: No CVA tenderness.  No bladder fullness or masses.  Patient with circumcised/uncircumcised phallus. ***Foreskin easily retracted***  Urethral meatus is patent.  No penile discharge. No penile lesions or rashes. Scrotum without lesions, cysts, rashes and/or edema.  Testicles are located scrotally bilaterally. No masses are appreciated in the testicles. Left and right epididymis are normal. Rectal: Patient with  normal sphincter tone. Anus and perineum without scarring or rashes. No rectal masses are appreciated. Prostate is approximately *** grams, *** nodules are appreciated. Seminal vesicles are normal. Neurologic: Grossly intact, no focal deficits, moving all 4 extremities. Psychiatric: Normal mood and affect.   Laboratory Data: PSA pending  Component     Latest Ref Rng 10/13/2022  INR     2.0 - 3.0  2.4    Component     Latest Ref Rng 09/15/2022  Microalb, Ur     0.0 - 1.9 mg/dL 3.0 (H)   Creatinine,U     mg/dL 932.3   MICROALB/CREAT RATIO     0.0 - 30.0 mg/g 2.9     Legend: (H) High  Component     Latest Ref Rng 09/15/2022  TSH     0.35 - 5.50 uIU/mL 6.04 (H)     Legend: (H) High  Component     Latest Ref Rng 09/15/2022  Hemoglobin A1C     4.6 - 6.5 % 5.8     Component     Latest Ref Rng 09/15/2022  Glucose     70 - 99 mg/dL 76   BUN     6 - 23 mg/dL 19   Creatinine     5.57 - 1.50 mg/dL 3.22   Sodium     025 - 145  mEq/L 141   Potassium     3.5 - 5.1 mEq/L 4.1   Chloride     96 - 112 mEq/L 106   CO2     19 - 32 mEq/L 25   Calcium     8.4 - 10.5 mg/dL 8.0 (L)   Total Protein     6.0 - 8.3 g/dL 6.6   Albumin     3.5 - 5.2 g/dL 4.0   Total Bilirubin     0.2 - 1.2 mg/dL 0.4   Alkaline Phosphatase     39 - 117 U/L 91   AST     0 - 37 U/L 19   ALT     0 - 53 U/L 24   GFR     >60.00 mL/min 67.43     Legend: (L) Low  Component     Latest Ref Rng 09/15/2022  Direct LDL     mg/dL 42.7     Component     Latest Ref Rng 09/15/2022  Triglycerides     0.0 - 149.0 mg/dL 062.3 (H)   HDL Cholesterol     >39.00 mg/dL 76.28   LDL (calc)     0 - 99 mg/dL 36   Total CHOL/HDL Ratio 3   Cholesterol     0 - 200 mg/dL 315   VLDL     0.0 - 17.6 mg/dL 16.0   NonHDL 73.71     Legend: (H) High   Pertinent Imaging: Results for orders placed or performed in visit on 04/26/22  BLADDER SCAN AMB NON-IMAGING  Result Value Ref Range   Scan Result 0 ml  Assessment & Plan:   Erectile dysfunction  -Did not respond to sildenail -  Discussed possible treatment options including PDE 5 inhibitors, vacuum erectile device, intracavernosal injection, MUSE, and placement of the inflatable or malleable penile prosthesis for refractory cases. - In terms of PDE 5 inhibitors, we discussed contraindications for this medication as well as common side effects. Patient was counseled on optimal use. All of his questions were answered in detail. - He would like to try Cialis. Cialis. Prescribed, Advised to not take with nitroglycerin.   2. Incontinence  - Emptying adequately  - Could be a component of both stress and urge we discussed starting OAB medication vs. physical therapy. He would like to decrease his weight before deciding  Return in 6 months for SHIM and IPS'S  Zara Council, PA-C   [facility] [address] [place], [place] [address] [phone]

## 2022-10-25 ENCOUNTER — Encounter: Payer: Self-pay | Admitting: Urology

## 2022-10-25 ENCOUNTER — Ambulatory Visit (INDEPENDENT_AMBULATORY_CARE_PROVIDER_SITE_OTHER): Payer: PPO | Admitting: Urology

## 2022-10-25 VITALS — BP 138/81 | Ht 75.0 in | Wt 350.0 lb

## 2022-10-25 DIAGNOSIS — R32 Unspecified urinary incontinence: Secondary | ICD-10-CM

## 2022-10-25 DIAGNOSIS — N138 Other obstructive and reflux uropathy: Secondary | ICD-10-CM

## 2022-10-25 DIAGNOSIS — N529 Male erectile dysfunction, unspecified: Secondary | ICD-10-CM

## 2022-10-25 DIAGNOSIS — N401 Enlarged prostate with lower urinary tract symptoms: Secondary | ICD-10-CM | POA: Diagnosis not present

## 2022-10-27 NOTE — Progress Notes (Signed)
[name] [address] [place] [address] Phone: [phone] Subjective:   [name], am serving as a scribe for Dr. Antoine Primas.  I'm seeing this patient by the request  of:  Sherlene Shams, MD  CC: Right shoulder pain  [name]  09/16/2022 Patient's neuropathy seems to be worsening and do need to know if this is more secondary to the lumbar radiculopathy and see if it something we can change over the possibility of the peripheral neuropathy.  Patient will be referred for a nerve conduction test of the bilateral lower extremities and we will also see if we can expedite the referral process to neurology.     Known severe rotator cuff arthropathy and does appear that patient may have actually if there is some avascular necrosis of the possibility of a small nondisplaced fracture noted. Discussed with patient at great length. Patient will limit any type of lifting mechanics over the next 4 weeks. We did discuss the possibility of injecting which patient though would like to hold which I think is a good idea and reevaluate in 4 weeks.   Update 11/02/2022 RAYNE LOISEAU is a 67 y.o. male coming in with complaint of R shoulder pain.  Severe rotator cuff arthropathy noted and what appeared to be possible old nondisplaced fracture.  Patient states shoulder pain is about the same. Doing better at protecting it. Right wrist hurts more. Observes less strength and function.        Past Medical History:  Diagnosis Date   Arthritis    a. chronic joint pain   B12 deficiency    Chronic systolic CHF (congestive heart failure) (HCC)    a. echo 2013: EF of 50-55%, normal right ventricular systolic pressure, normal left atrium; b. EF 40-45%, inadequate for LV wall motion, mild MR, LA severely dilated @ 52 mm, PASP nl   Erythema migrans (Lyme disease) 04/08/2015   History of DVT (deep vein thrombosis) 2004   s/p bariatric surgery   History of stress  test    a. there was no ST segment deviation noted during stress,    There is a small defect of moderate severity present in the apex location, suggestive of apical ischemia, intermediate risk, calculated EF 21% but visually appears to be 35-40%   Hypertension    Hypothyroidism    Neuropathy    Obesity    OSA (obstructive sleep apnea)    a. not compliant with CPAP   PAF (paroxysmal atrial fibrillation) (HCC)    a. CHADSVASc at least 2 (HTN and vascular disease); b. on Eliquis    Past Surgical History:  Procedure Laterality Date   ARTHROSCOPIC REPAIR ACL Bilateral 1993   bilateral knee x2 on left and x2 on right   BARIATRIC SURGERY  2004   Rou en Y    BARIATRIC SURGERY     CHOLECYSTECTOMY  1982   ELECTROPHYSIOLOGIC STUDY N/A 06/20/2015   Procedure: CARDIOVERSION;  Surgeon: Antonieta Iba, MD;  Location: [facility] ORS;  Service: Cardiovascular;  Laterality: N/A;   KNEE ARTHROPLASTY Right 06/20/2017   Procedure: COMPUTER ASSISTED TOTAL KNEE ARTHROPLASTY;  Surgeon: Donato Heinz, MD;  Location: [facility] ORS;  Service: Orthopedics;  Laterality: Right;   MENISCUS DEBRIDEMENT Left 2011   Left knee   REPLACEMENT TOTAL KNEE Left 07/2013   TONSILLECTOMY     Social History   Socioeconomic History   Marital status: Married    Spouse name: Not on file  Number of children: Not on file   Years of education: Not on file   Highest education level: Not on file  Occupational History   Not on file  Tobacco Use   Smoking status: Never    Passive exposure: Never   Smokeless tobacco: Never  Vaping Use   Vaping Use: Never used  Substance and Sexual Activity   Alcohol use: Yes    Alcohol/week: 2.0 standard drinks of alcohol    Types: 2 Shots of liquor per week    Comment: Vodka and lemonade occasionally 2-3/week    Drug use: No   Sexual activity: Yes    Partners: Female    Comment: Wife  Other Topics Concern   Not on file  Social History Narrative   Currently between jobs   Lives with Wife    1 Son (33) and 1 daughter (75)    2 grandchildren by daughter    1 dog and 1 cat    Enjoys gardening, movies, dining out.    Social Determinants of Health   Financial Resource Strain: Low Risk  (02/22/2022)   Overall Financial Resource Strain (CARDIA)    Difficulty of Paying Living Expenses: Not hard at all  Food Insecurity: No Food Insecurity (02/22/2022)   Hunger Vital Sign    Worried About Running Out of Food in the Last Year: Never true    Ran Out of Food in the Last Year: Never true  Transportation Needs: No Transportation Needs (02/22/2022)   PRAPARE - Hydrologist (Medical): No    Lack of Transportation (Non-Medical): No  Physical Activity: Not on file  Stress: No Stress Concern Present (02/22/2022)   Scalp Level    Feeling of Stress : Not at all  Social Connections: Unknown (02/22/2022)   Social Connection and Isolation Panel [NHANES]    Frequency of Communication with Friends and Family: Not on file    Frequency of Social Gatherings with Friends and Family: Not on file    Attends Religious Services: Not on file    Active Member of Clubs or Organizations: Not on file    Attends Archivist Meetings: Not on file    Marital Status: Married   Allergies  Allergen Reactions   Eliquis [Apixaban] Itching    "Red spots" and severe itching   Xarelto [Rivaroxaban] Other (See Comments)    Reaction in 2013   Family History  Problem Relation Age of Onset   Dementia Mother    Diabetes Father    Alzheimer's disease Father     Current Outpatient Medications (Endocrine & Metabolic):    levothyroxine (SYNTHROID) 25 MCG tablet, Take 1 tablet (25 mcg total) by mouth daily before breakfast.  Current Outpatient Medications (Cardiovascular):    amiodarone (PACERONE) 200 MG tablet, Take 1 tablet (200 mg total) by mouth 2 (two) times daily.   furosemide (LASIX) 40 MG tablet, Take 1  tablet (40 mg total) by mouth daily as needed.   hydrochlorothiazide (HYDRODIURIL) 25 MG tablet, Take 1 tablet (25 mg total) by mouth daily.   losartan (COZAAR) 100 MG tablet, Take 1 tablet by mouth once daily   metoprolol succinate (TOPROL-XL) 50 MG 24 hr tablet, TAKE 1 TABLET BY MOUTH DAILY.   propranolol (INDERAL) 20 MG tablet, Take 1 tablet (20 mg total) by mouth 3 (three) times daily as needed. Reported on 03/22/2016   rosuvastatin (CRESTOR) 10 MG tablet, TAKE 1 TABLET (10 MG  TOTAL) BY MOUTH DAILY AT 2 PM.   sildenafil (REVATIO) 20 MG tablet, Take 1 tablet (20 mg total) by mouth 3 (three) times daily.   tadalafil (CIALIS) 20 MG tablet, Take 1 tablet (20 mg total) by mouth daily as needed for erectile dysfunction.   Current Outpatient Medications (Analgesics):    HYDROcodone-acetaminophen (NORCO) 10-325 MG tablet, Take 1 tablet by mouth every 8 (eight) hours as needed.   HYDROcodone-acetaminophen (NORCO) 10-325 MG tablet, Take 1 tablet by mouth every 8 (eight) hours as needed.   HYDROcodone-acetaminophen (NORCO/VICODIN) 5-325 MG tablet, Take 2 tablets by mouth every 6 (six) hours as needed.  Current Outpatient Medications (Hematological):    Ferrous Gluconate 324 (37.5 Fe) MG TABS, Take 1 tablet by mouth daily.    vitamin B-12 (CYANOCOBALAMIN) 500 MCG tablet, Take 500 mcg by mouth daily.   warfarin (COUMADIN) 5 MG tablet, TAKE 1 OR 2 TABLETS BY MOUTH EVERY DAY AS DIRECTED BY ANTI-COAG CLINIC  Current Outpatient Medications (Other):    B Complex Vitamins (B COMPLEX 50 PO), Take 1 tablet by mouth daily.    buPROPion (WELLBUTRIN XL) 300 MG 24 hr tablet, Take 1 tablet (300 mg total) by mouth daily.   Cholecalciferol (VITAMIN D3) 2000 UNITS TABS, Take 1 tablet by mouth daily.    docusate sodium (COLACE) 100 MG capsule, Take 400 mg by mouth daily.    gabapentin (NEURONTIN) 300 MG capsule, TAKE 1 CAPSULE BY MOUTH THREE TIMES A DAY   GLUCOSAMINE HCL-MSM PO, Take 1 tablet by mouth daily.     Multiple Vitamin (MULTIVITAMIN) tablet, Take 1 tablet by mouth daily.   Potassium Chloride ER 20 MEQ TBCR, Take 20 mEq by mouth 2 (two) times daily as needed.   venlafaxine XR (EFFEXOR-XR) 150 MG 24 hr capsule, Take 1 capsule (150 mg total) by mouth daily with breakfast.   vitamin E 400 UNIT capsule, Take 400 Units by mouth daily.   Reviewed prior external information including notes and imaging from  primary care provider As well as notes that were available from care everywhere and other healthcare systems.  Past medical history, social, surgical and family history all reviewed in electronic medical record.  No pertanent information unless stated regarding to the chief complaint.   Review of Systems:  No headache, visual changes, nausea, vomiting, diarrhea, constipation, dizziness, abdominal pain, skin rash, fevers, chills, night sweats, weight loss, swollen lymph nodes, body aches, joint swelling, chest pain, shortness of breath, mood changes. POSITIVE muscle aches  Objective  Blood pressure 126/70, pulse 60, height 6\' 3"  (1.905 m), weight (!) 352 lb (159.7 kg), SpO2 90 %.   General: No apparent distress alert and oriented x3 mood and affect normal, dressed appropriately.  HEENT: Pupils equal, extraocular movements intact  Severely antalgic gait walking with the aid of a cane.  The patient does have significant arthritic changes of multiple joints including his hands. Right shoulder exam shows crepitus noted.  Does have weakness with 3 out of 5 strength of the rotator cuff.  Limited range of motion as well. Right wrist does have some difficulty noted.  Patient does have tenderness over the scaphoid lunate area.  Patient does have some limited range of motion.  Limited muscular skeletal ultrasound was performed and interpreted by Hulan Saas, M  .  Patient ultrasound shows that patient does have some significant arthritic changes in multiple different joints including the scaphoid  lunate area.  Significant arthritic changes of multiple joints. Impression: Arthritis   Impression and  Recommendations:    The above documentation has been reviewed and is accurate and complete Lyndal Pulley, DO

## 2022-11-01 ENCOUNTER — Encounter: Payer: Self-pay | Admitting: Cardiovascular Disease

## 2022-11-02 ENCOUNTER — Ambulatory Visit: Payer: PPO | Admitting: Family Medicine

## 2022-11-02 ENCOUNTER — Ambulatory Visit: Payer: Self-pay

## 2022-11-02 VITALS — BP 126/70 | HR 60 | Ht 75.0 in | Wt 352.0 lb

## 2022-11-02 DIAGNOSIS — M19031 Primary osteoarthritis, right wrist: Secondary | ICD-10-CM

## 2022-11-02 DIAGNOSIS — M12811 Other specific arthropathies, not elsewhere classified, right shoulder: Secondary | ICD-10-CM | POA: Diagnosis not present

## 2022-11-02 NOTE — Assessment & Plan Note (Signed)
Arthritis noted.  Discussed which activities to do and which ones to avoid.  Discussed potential bracing.  I believe that patient needs to consider the potential for this shoulder replacement before he would consider anything for the wrist at the moment.  Could do bracing though if it is helpful.

## 2022-11-02 NOTE — Patient Instructions (Addendum)
Referral  to Dr. Tamera Punt

## 2022-11-02 NOTE — Assessment & Plan Note (Signed)
rotator cuff arthropathy not responding as well to the injections.  Patient is wanting to know if surgical intervention is a possibility and we referred to orthopedic surgery to discuss.  Hopefully that this will be something that can make significant improvement from here on out.  Follow-up again as needed

## 2022-11-19 ENCOUNTER — Other Ambulatory Visit: Payer: Self-pay

## 2022-11-19 MED ORDER — HYDROCODONE-ACETAMINOPHEN 10-325 MG PO TABS: 1.0000 | ORAL_TABLET | Freq: Three times a day (TID) | ORAL | 0 refills | Status: DC | PRN

## 2022-11-22 ENCOUNTER — Telehealth: Payer: Self-pay | Admitting: Cardiovascular Disease

## 2022-11-22 ENCOUNTER — Telehealth: Payer: Self-pay

## 2022-11-22 NOTE — Telephone Encounter (Signed)
We received a Pre-Operative Risk Assessment from [facility] for patient today via fax.  I sent a copy to provider's folder on S drive and hand-delivered a copy to Adair Laundry, [place].

## 2022-11-22 NOTE — Telephone Encounter (Signed)
Pt is also getting cardiac clearance from Dr. Rockey Situ.

## 2022-11-22 NOTE — Telephone Encounter (Signed)
Pharmacy please advise on holding Coumadin prior to reverse total shoulder arthroplasty scheduled for TBD. Thank you.

## 2022-11-22 NOTE — Telephone Encounter (Signed)
ADDENDUM TO CLEARANCE REQUEST:   PT WILL NEED TO HOLD COUMADIN

## 2022-11-22 NOTE — Telephone Encounter (Signed)
Callback team please contact requesting provider's office and inquire regarding patient's Coumadin and guidance for holding for requested procedure.  Thanks, Jaquelyn Bitter

## 2022-11-22 NOTE — Telephone Encounter (Signed)
   Pre-operative Risk Assessment    Patient Name: Johnathan [name]  DOB: 10/04/1956 MRN: [id number]      Request for Surgical Clearance    Procedure:   RIGHT REVERSE TOTAL SHOULDER ARTHROPLASTY  Date of Surgery:  Clearance TBD                                Surgeon:  DR Tania Ade Surgeon's Group or Practice Name:  [facility] Phone number:  [phone] [place] Fax number:  [phone] ATTN: [name]  Type of Clearance Requested:   - Medical    Type of Anesthesia:   CHOICE  Additional requests/questions:    Johnathan [name]   11/22/2022, 2:03 PM

## 2022-11-23 ENCOUNTER — Other Ambulatory Visit: Payer: Self-pay | Admitting: Cardiovascular Disease

## 2022-11-23 DIAGNOSIS — I4819 Other persistent atrial fibrillation: Secondary | ICD-10-CM

## 2022-11-23 NOTE — Telephone Encounter (Signed)
Please assist patient with refill request, thank you.

## 2022-11-23 NOTE — Telephone Encounter (Signed)
Warfarin 56m refill Afib Last INR Visit 10/13/22 Last OV 09/21/22

## 2022-11-24 ENCOUNTER — Ambulatory Visit: Payer: PPO | Attending: Cardiovascular Disease | Admitting: Pharmacist

## 2022-11-24 DIAGNOSIS — I4819 Other persistent atrial fibrillation: Secondary | ICD-10-CM | POA: Diagnosis not present

## 2022-11-24 DIAGNOSIS — Z86718 Personal history of other venous thrombosis and embolism: Secondary | ICD-10-CM | POA: Diagnosis not present

## 2022-11-24 DIAGNOSIS — Z5181 Encounter for therapeutic drug level monitoring: Secondary | ICD-10-CM

## 2022-11-24 LAB — POCT INR: INR: 2.5 (ref 2.0–3.0)

## 2022-11-24 NOTE — Patient Instructions (Signed)
Description    Continue Warfarin 1 tablet every day EXCEPT 1.5 tablets on MONDAYS, WEDNESDAYS, FRIDAYS.  - Recheck INR in 6 weeks TRY TO GET YOUR GREENS ON A SCHEDULE.  PICK A DAY (OR DAYS) AND HAVE THEM ON THE SAME DAY EACH WEEK

## 2022-11-24 NOTE — Telephone Encounter (Signed)
   Patient Name: Johnathan [name]  DOB: 1955/11/15 MRN: [id number]  Primary Cardiologist: None  Clinical pharmacists have reviewed the patient's past medical history, labs, and current medications as part of preoperative protocol coverage. The following recommendations have been made:  Patient with diagnosis of atrial fibrillation on warfarin for anticoagulation.     Procedure:   RIGHT REVERSE TOTAL SHOULDER ARTHROPLASTY   Date of Surgery:  Clearance TBD       CHA2DS2-VASc Score = 3   This indicates a 3.2% annual risk of stroke. The patient's score is based upon: CHF History: 1 HTN History: 1 Diabetes History: 0 Stroke History: 0 Vascular Disease History: 0 Age Score: 1 Gender Score: 0   Patient chart notes history of DVT in 2004, no other information.    CrCl 103 Platelet count 178   Per office protocol, patient can hold warfarin for 5 days prior to procedure.   Patient will not need bridging with Lovenox (enoxaparin) around procedure.   Pt has INR checks in [facility] coumadin clinic.  He will need to update clinic once date of procedure is known.    I will route this recommendation to the requesting party via Epic fax function and remove from pre-op pool.  Please call with questions.  Mable Fill, Marissa Nestle, NP 11/24/2022, 4:08 PM

## 2022-11-24 NOTE — Telephone Encounter (Signed)
Patient with diagnosis of atrial fibrillation on warfarin for anticoagulation.    Procedure:   RIGHT REVERSE TOTAL SHOULDER ARTHROPLASTY   Date of Surgery:  Clearance TBD     CHA2DS2-VASc Score = 3   This indicates a 3.2% annual risk of stroke. The patient's score is based upon: CHF History: 1 HTN History: 1 Diabetes History: 0 Stroke History: 0 Vascular Disease History: 0 Age Score: 1 Gender Score: 0   Patient chart notes history of DVT in 2004, no other information.   CrCl 103 Platelet count 178  Per office protocol, patient can hold warfarin for 5 days prior to procedure.   Patient will not need bridging with Lovenox (enoxaparin) around procedure.  Pt has INR checks in [facility] coumadin clinic.  He will need to update clinic once date of procedure is known.   **This guidance is not considered finalized until pre-operative APP has relayed final recommendations.**

## 2022-11-24 NOTE — Telephone Encounter (Signed)
Faxed and pt is aware.

## 2022-12-02 ENCOUNTER — Other Ambulatory Visit: Payer: Self-pay | Admitting: Orthopedic Surgery

## 2022-12-09 NOTE — Progress Notes (Signed)
[facility] [place] Phone: [phone] Subjective:   Johnathan [name], am serving as a scribe for Dr. Hulan Saas.  I'm seeing this patient by the request  of:  Crecencio Mc, MD  CC: Right wrist and right shoulder pain  [id number]  10/13/2022 Arthritis noted.  Discussed which activities to do and which ones to avoid.  Discussed potential bracing.  I believe that patient needs to consider the potential for this shoulder replacement before he would consider anything for the wrist at the moment.  Could do bracing though if it is helpful.     rotator cuff arthropathy not responding as well to the injections. Patient is wanting to know if surgical intervention is a possibility and we referred to orthopedic surgery to discuss. Hopefully that this will be something that can make significant improvement from here on out. Follow-up again as needed   Update 12/15/2022 Johnathan [name] is a 67 y.o. male coming in with complaint of R wrist and R shoulder pain. Patient states that his R hand and wrist have been painful. Using cane in R hand but has transitioned to walker. Pain in wrist with all motions and radiates up the arm into the neck. Also notes some cysts in his wrist that seems to have grown since last visit.   Surgery scheduled for March 22nd for his shoulder.       Past Medical History:  Diagnosis Date   Arthritis    a. chronic joint pain   B12 deficiency    Chronic systolic CHF (congestive heart failure) ([place])    a. echo 2013: EF of 50-55%, normal right ventricular systolic pressure, normal left atrium; b. EF 40-45%, inadequate for LV wall motion, mild MR, LA severely dilated @ 52 mm, PASP nl   Erythema migrans (Lyme disease) 04/08/2015   History of DVT (deep vein thrombosis) 2004   s/p bariatric surgery   History of stress test    a. there was no ST segment deviation noted during stress,    There is a small defect  of moderate severity present in the apex location, suggestive of apical ischemia, intermediate risk, calculated EF 21% but visually appears to be 35-40%   Hypertension    Hypothyroidism    Neuropathy    Obesity    OSA (obstructive sleep apnea)    a. not compliant with CPAP   PAF (paroxysmal atrial fibrillation) ([place])    a. CHADSVASc at least 2 (HTN and vascular disease); b. on Eliquis    Past Surgical History:  Procedure Laterality Date   ARTHROSCOPIC REPAIR ACL Bilateral 1993   bilateral knee x2 on left and x2 on right   BARIATRIC SURGERY  2004   Rou en Y    [place] N/A 06/20/2015   Procedure: CARDIOVERSION;  Surgeon: Minna Merritts, MD;  Location: [facility] ORS;  Service: Cardiovascular;  Laterality: N/A;   KNEE ARTHROPLASTY Right 06/20/2017   Procedure: COMPUTER ASSISTED TOTAL KNEE ARTHROPLASTY;  Surgeon: Dereck Leep, MD;  Location: [facility] ORS;  Service: Orthopedics;  Laterality: Right;   MENISCUS DEBRIDEMENT Left 2011   Left knee   REPLACEMENT TOTAL KNEE Left 07/2013   TONSILLECTOMY     Social History   Socioeconomic History   Marital status: Married    Spouse name: Not on file   Number of children: Not on file   Years of  education: Not on file   Highest education level: Not on file  Occupational History   Not on file  Tobacco Use   Smoking status: Never    Passive exposure: Never   Smokeless tobacco: Never  Vaping Use   Vaping Use: Never used  Substance and Sexual Activity   Alcohol use: Yes    Alcohol/week: 2.0 standard drinks of alcohol    Types: 2 Shots of liquor per week    Comment: Vodka and lemonade occasionally 2-3/week    Drug use: No   Sexual activity: Yes    Partners: Female    Comment: Wife  Other Topics Concern   Not on file  Social History Narrative   Currently between jobs   Lives with Wife   1 Son (33) and 1 daughter (55)    2 grandchildren by daughter    1 dog and 1 cat     Enjoys gardening, movies, dining out.    Social Determinants of Health   Financial Resource Strain: Low Risk  (02/22/2022)   Overall Financial Resource Strain (CARDIA)    Difficulty of Paying Living Expenses: Not hard at all  Food Insecurity: No Food Insecurity (02/22/2022)   Hunger Vital Sign    Worried About Running Out of Food in the Last Year: Never true    Ran Out of Food in the Last Year: Never true  Transportation Needs: No Transportation Needs (02/22/2022)   PRAPARE - Hydrologist (Medical): No    Lack of Transportation (Non-Medical): No  Physical Activity: Not on file  Stress: No Stress Concern Present (02/22/2022)   Reform    Feeling of Stress : Not at all  Social Connections: Unknown (02/22/2022)   Social Connection and Isolation Panel [NHANES]    Frequency of Communication with Friends and Family: Not on file    Frequency of Social Gatherings with Friends and Family: Not on file    Attends Religious Services: Not on file    Active Member of Clubs or Organizations: Not on file    Attends Archivist Meetings: Not on file    Marital Status: Married   Allergies  Allergen Reactions   Eliquis [Apixaban] Itching    "Red spots" and severe itching   Xarelto [Rivaroxaban] Other (See Comments)    Reaction in 2013   Family History  Problem Relation Age of Onset   Dementia Mother    Diabetes Father    Alzheimer's disease Father     Current Outpatient Medications (Endocrine & Metabolic):    levothyroxine (SYNTHROID) 25 MCG tablet, Take 1 tablet (25 mcg total) by mouth daily before breakfast.  Current Outpatient Medications (Cardiovascular):    amiodarone (PACERONE) 200 MG tablet, Take 1 tablet (200 mg total) by mouth 2 (two) times daily.   furosemide (LASIX) 40 MG tablet, Take 1 tablet (40 mg total) by mouth daily as needed.   hydrochlorothiazide (HYDRODIURIL) 25 MG  tablet, Take 1 tablet (25 mg total) by mouth daily.   losartan (COZAAR) 100 MG tablet, Take 1 tablet by mouth once daily   metoprolol succinate (TOPROL-XL) 50 MG 24 hr tablet, TAKE 1 TABLET BY MOUTH DAILY.   propranolol (INDERAL) 20 MG tablet, Take 1 tablet (20 mg total) by mouth 3 (three) times daily as needed. Reported on 03/22/2016   rosuvastatin (CRESTOR) 10 MG tablet, TAKE 1 TABLET (10 MG TOTAL) BY MOUTH DAILY AT 2 PM.   sildenafil (  REVATIO) 20 MG tablet, Take 1 tablet (20 mg total) by mouth 3 (three) times daily.   tadalafil (CIALIS) 20 MG tablet, Take 1 tablet (20 mg total) by mouth daily as needed for erectile dysfunction.   Current Outpatient Medications (Analgesics):    HYDROcodone-acetaminophen (NORCO) 10-325 MG tablet, Take 1 tablet by mouth every 8 (eight) hours as needed.   HYDROcodone-acetaminophen (NORCO) 10-325 MG tablet, Take 1 tablet by mouth every 8 (eight) hours as needed.   HYDROcodone-acetaminophen (NORCO/VICODIN) 5-325 MG tablet, Take 2 tablets by mouth every 6 (six) hours as needed.  Current Outpatient Medications (Hematological):    Ferrous Gluconate 324 (37.5 Fe) MG TABS, Take 1 tablet by mouth daily.    vitamin B-12 (CYANOCOBALAMIN) 500 MCG tablet, Take 500 mcg by mouth daily.   warfarin (COUMADIN) 5 MG tablet, TAKE 1 TABLET TO 1.5 TABLETS BY MOUTH DAILY AS DIRECTED BY ANTI-COAG CLINIC  Current Outpatient Medications (Other):    B Complex Vitamins (B COMPLEX 50 PO), Take 1 tablet by mouth daily.    buPROPion (WELLBUTRIN XL) 300 MG 24 hr tablet, Take 1 tablet (300 mg total) by mouth daily.   Cholecalciferol (VITAMIN D3) 2000 UNITS TABS, Take 1 tablet by mouth daily.    docusate sodium (COLACE) 100 MG capsule, Take 400 mg by mouth daily.    gabapentin (NEURONTIN) 300 MG capsule, TAKE 1 CAPSULE BY MOUTH THREE TIMES A DAY   GLUCOSAMINE HCL-MSM PO, Take 1 tablet by mouth daily.    Multiple Vitamin (MULTIVITAMIN) tablet, Take 1 tablet by mouth daily.   Potassium  Chloride ER 20 MEQ TBCR, Take 20 mEq by mouth 2 (two) times daily as needed.   venlafaxine XR (EFFEXOR-XR) 150 MG 24 hr capsule, Take 1 capsule (150 mg total) by mouth daily with breakfast.   vitamin E 400 UNIT capsule, Take 400 Units by mouth daily.   Reviewed prior external information including notes and imaging from  primary care provider As well as notes that were available from care everywhere and other healthcare systems.  Past medical history, social, surgical and family history all reviewed in electronic medical record.  No pertanent information unless stated regarding to the chief complaint.   Review of Systems:  No headache, visual changes, nausea, vomiting, diarrhea, constipation, dizziness, abdominal pain, skin rash, fevers, chills, night sweats, weight loss, swollen lymph nodes, body aches, joint swelling, chest pain, shortness of breath, mood changes. POSITIVE muscle aches  Objective  Blood pressure 124/78, pulse (!) 52, height '6\' 3"'$  (1.905 m), weight (!) 356 lb (161.5 kg), SpO2 94 %.   General: No apparent distress alert and oriented x3 mood and affect normal, dressed appropriately.  HEENT: Pupils equal, extraocular movements intact  Respiratory: Patient's speak in full sentences and does not appear short of breath  Cardiovascular: No lower extremity edema, non tender, no erythema  Left wrist does have significant arthritic changes noted.  Patient though does have swelling of the forearm noted on the dorsal aspect of the forearm.  Minorly tender to palpation noted.  Good grip strength with significant arthritic changes noted Severely antalgic gait noted.  Limited muscular skeletal ultrasound was performed and interpreted by Hulan Saas, M   Limited ultrasound of patient's right forearm shows hypoechoic changes consistent with either hematoma or seroma that seems to be underneath the soft cutaneous tissue.  No abnormal blood flow in the area.  No true masses in this area.   Arthritic changes noted of the wrist in multiple areas in the [facility]  joint.  At the base of the [facility] joint it does appear that patient may have a hypoechoic changes that is consistent with a cyst formation minorly tender to compression. Impression: Hematoma of the forearm and [facility] arthritis   Impression and Recommendations:     The above documentation has been reviewed and is accurate and complete Lyndal Pulley, DO

## 2022-12-14 ENCOUNTER — Ambulatory Visit: Payer: Self-pay

## 2022-12-14 ENCOUNTER — Ambulatory Visit: Payer: PPO | Admitting: Family Medicine

## 2022-12-14 ENCOUNTER — Encounter: Payer: Self-pay | Admitting: Family Medicine

## 2022-12-14 VITALS — BP 124/78 | HR 52 | Ht 75.0 in | Wt 356.0 lb

## 2022-12-14 DIAGNOSIS — M25531 Pain in right wrist: Secondary | ICD-10-CM

## 2022-12-14 DIAGNOSIS — M19031 Primary osteoarthritis, right wrist: Secondary | ICD-10-CM

## 2022-12-14 DIAGNOSIS — M12811 Other specific arthropathies, not elsewhere classified, right shoulder: Secondary | ICD-10-CM | POA: Diagnosis not present

## 2022-12-14 DIAGNOSIS — S40021A Contusion of right upper arm, initial encounter: Secondary | ICD-10-CM | POA: Diagnosis not present

## 2022-12-14 NOTE — Assessment & Plan Note (Signed)
Patient is what appears to be more of a hematoma.  Discussed with patient about icing regimen and home exercises, discussed with patient about compression.  Patient declined any type of aspiration today.  Is fairly large overall and just happened yesterday.  Hopefully later will slowly improve over the course of the next week or 2.  Warned the patient if any numbness, weakness recurs to seek medical attention immediately.

## 2022-12-14 NOTE — Assessment & Plan Note (Signed)
Significant arthritic changes of the wrist noted on ultrasound as well today.  Will need to continue to monitor.  Likely does have a small ganglion cyst and appears at the base of the [facility] joint.  Increase activity otherwise but really should consider bracing on a regular basis.

## 2022-12-14 NOTE — Patient Instructions (Signed)
Ice for 20 min every 4 hours for 72 hours Ace wrap See me in 6-8 weeks after your surgery

## 2022-12-14 NOTE — Assessment & Plan Note (Signed)
Having replacements in the very near future.

## 2022-12-15 ENCOUNTER — Ambulatory Visit (INDEPENDENT_AMBULATORY_CARE_PROVIDER_SITE_OTHER): Payer: PPO | Admitting: Internal Medicine

## 2022-12-15 ENCOUNTER — Encounter: Payer: Self-pay | Admitting: Internal Medicine

## 2022-12-15 VITALS — BP 118/74 | HR 53 | Temp 98.5°F | Ht 75.0 in | Wt 364.4 lb

## 2022-12-15 DIAGNOSIS — M4692 Unspecified inflammatory spondylopathy, cervical region: Secondary | ICD-10-CM | POA: Diagnosis not present

## 2022-12-15 DIAGNOSIS — M5412 Radiculopathy, cervical region: Secondary | ICD-10-CM

## 2022-12-15 DIAGNOSIS — M5416 Radiculopathy, lumbar region: Secondary | ICD-10-CM | POA: Diagnosis not present

## 2022-12-15 DIAGNOSIS — Z1211 Encounter for screening for malignant neoplasm of colon: Secondary | ICD-10-CM

## 2022-12-15 NOTE — Patient Instructions (Signed)
Your last tetanus vaccination was in 2016

## 2022-12-15 NOTE — Progress Notes (Unsigned)
Subjective:  Patient ID: Johnathan [name], male    DOB: 1956/10/08  Age: 67 y.o. MRN: [id number]  CC: There were no encounter diagnoses.   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  Patient's pain is currently managed with opioids  due to failure of adequate pain control with multiple trials of non opioid alternatives , and contraindications to use of  NSAIDS due to concurrent anticoagulation for atrial fibrillation.  He continues to use  acetaminophen, ut had no relief with muscle relaxers,  and tricyclic antidepressants.  Physical therapy was also prescribed but failed to relieve pain to a level that was tolerable.  Patient reports that the current regimen of opioid prescribed does alleviate the low back pain to a level that is tolerable ,  is not causing sedation, and is not causing constipation that cannot be managed with laxatives .  Patient is able to walk  short distances and perform all ADLS much easier than compared to  daily activitiy level without use of opioids  Patient does not appear sedated today  Patient is aware that 3 month in person follow up is mandatory for refills of opioid prescriptions and that occasionally an  unannounced urine drug screens will be ordered and expected to show the presence of opioid metabolites and no illicit substances.   Refill history has confirmed via [place] Controlled Substance database accessed by me today.. Last refill of   hydrocodone/acetaminophen was Feb 9 for #90 tablets . Patient  has not had any ER visits  And has not requested any early refills.  there have been no prescriptions of controlled substances filled from any providers other than me.     Frequent falls:  he fell yesterday  while cleaning out the chicken coop and sprained his left quadriceps and righ wrists.  .  No bruises from yesterday . Warfarin checked every 6 weeks  inr has been unchanged.  At 2.5.  He sustained aslight scrapeon extensor surface of left elbow.  No excessive bleeding.  Tetanus booster reviewed date of admin 2016.     He reporsts his legs feel weak after 10 minutes of walking   Surgery march 21 on shoulder   [place]   Colonosocpy is due  Outpatient Medications Prior to Visit  Medication Sig Dispense Refill   amiodarone (PACERONE) 200 MG tablet Take 1 tablet (200 mg total) by mouth 2 (two) times daily. 180 tablet 3   B Complex Vitamins (B COMPLEX 50 PO) Take 1 tablet by mouth daily.      buPROPion (WELLBUTRIN XL) 300 MG 24 hr tablet Take 1 tablet (300 mg total) by mouth daily. 90 tablet 3   Cholecalciferol (VITAMIN D3) 2000 UNITS TABS Take 1 tablet by mouth daily.      docusate sodium (COLACE) 100 MG capsule Take 400 mg by mouth daily.      Ferrous Gluconate 324 (37.5 Fe) MG TABS Take 1 tablet by mouth daily.      furosemide (LASIX) 40 MG tablet Take 1 tablet (40 mg total) by mouth daily as needed. 90 tablet 0   gabapentin (NEURONTIN) 300 MG capsule TAKE 1 CAPSULE BY MOUTH THREE TIMES A DAY 270 capsule 3  GLUCOSAMINE HCL-MSM PO Take 1 tablet by mouth daily.      hydrochlorothiazide (HYDRODIURIL) 25 MG tablet Take 1 tablet (25 mg total) by mouth daily. 90 tablet 3   HYDROcodone-acetaminophen (NORCO) 10-325 MG tablet Take 1 tablet by mouth every 8 (eight) hours as needed. 90 tablet 0   HYDROcodone-acetaminophen (NORCO) 10-325 MG tablet Take 1 tablet by mouth every 8 (eight) hours as needed. 90 tablet 0   HYDROcodone-acetaminophen (NORCO/VICODIN) 5-325 MG tablet Take 2 tablets by mouth every 6 (six) hours as needed. 180 tablet 0   levothyroxine (SYNTHROID) 25 MCG tablet Take 1 tablet (25 mcg total) by mouth daily before breakfast. 90 tablet 3   losartan (COZAAR) 100 MG tablet  Take 1 tablet by mouth once daily 90 tablet 0   metoprolol succinate (TOPROL-XL) 50 MG 24 hr tablet TAKE 1 TABLET BY MOUTH DAILY. 90 tablet 1   Multiple Vitamin (MULTIVITAMIN) tablet Take 1 tablet by mouth daily.     Potassium Chloride ER 20 MEQ TBCR Take 20 mEq by mouth 2 (two) times daily as needed. 180 tablet 3   propranolol (INDERAL) 20 MG tablet Take 1 tablet (20 mg total) by mouth 3 (three) times daily as needed. Reported on 03/22/2016 270 tablet 3   rosuvastatin (CRESTOR) 10 MG tablet TAKE 1 TABLET (10 MG TOTAL) BY MOUTH DAILY AT 2 PM. 90 tablet 3   sildenafil (REVATIO) 20 MG tablet Take 1 tablet (20 mg total) by mouth 3 (three) times daily. 90 tablet 1   tadalafil (CIALIS) 20 MG tablet Take 1 tablet (20 mg total) by mouth daily as needed for erectile dysfunction. 30 tablet 1   venlafaxine XR (EFFEXOR-XR) 150 MG 24 hr capsule Take 1 capsule (150 mg total) by mouth daily with breakfast. 90 capsule 1   vitamin B-12 (CYANOCOBALAMIN) 500 MCG tablet Take 500 mcg by mouth daily.     vitamin E 400 UNIT capsule Take 400 Units by mouth daily.     warfarin (COUMADIN) 5 MG tablet TAKE 1 TABLET TO 1.5 TABLETS BY MOUTH DAILY AS DIRECTED BY ANTI-COAG CLINIC 110 tablet 0   No facility-administered medications prior to visit.    Review of Systems;  Patient denies headache, fevers, malaise, unintentional weight loss, skin rash, eye pain, sinus congestion and sinus pain, sore throat, dysphagia,  hemoptysis , cough, dyspnea, wheezing, chest pain, palpitations, orthopnea, edema, abdominal pain, nausea, melena, diarrhea, constipation, flank pain, dysuria, hematuria, urinary  Frequency, nocturia, numbness, tingling, seizures,  Focal weakness, Loss of consciousness,  Tremor, insomnia, depression, anxiety, and suicidal ideation.      Objective:  BP 118/74   Pulse (!) 53   Temp 98.5 F (36.9 C) (Oral)   Ht '6\' 3"'$  (1.905 m)   Wt (!) 364 lb 6.4 oz (165.3 kg)   SpO2 96%   BMI 45.55 kg/m   BP Readings from  Last 3 Encounters:  12/15/22 118/74  12/14/22 124/78  11/02/22 126/70    Wt Readings from Last 3 Encounters:  12/15/22 (!) 364 lb 6.4 oz (165.3 kg)  12/14/22 (!) 356 lb (161.5 kg)  11/02/22 (!) 352 lb (159.7 kg)    Physical Exam  Lab Results  Component Value Date   HGBA1C 5.8 09/15/2022   HGBA1C 5.4 03/19/2022   HGBA1C 5.2 12/15/2021    Lab Results  Component Value Date   CREATININE 1.13 09/15/2022   CREATININE 0.95 03/19/2022   CREATININE 1.03 10/16/2021    Lab Results  Component Value Date   WBC  9.6 03/19/2022   HGB 14.3 03/19/2022   HCT 42.9 03/19/2022   PLT 178 03/19/2022   GLUCOSE 76 09/15/2022   CHOL 111 09/15/2022   TRIG 169.0 (H) 09/15/2022   HDL 41.70 09/15/2022   LDLDIRECT 54.0 09/15/2022   LDLCALC 36 09/15/2022   ALT 24 09/15/2022   AST 19 09/15/2022   NA 141 09/15/2022   K 4.1 09/15/2022   CL 106 09/15/2022   CREATININE 1.13 09/15/2022   BUN 19 09/15/2022   CO2 25 09/15/2022   TSH 6.04 (H) 09/15/2022   PSA 0.68 10/16/2021   INR 2.5 11/24/2022   HGBA1C 5.8 09/15/2022   MICROALBUR 3.0 (H) 09/15/2022    No results found.  Assessment & Plan:  .There are no diagnoses linked to this encounter.   I provided 30 minutes of face-to-face time during this encounter reviewing patient's last visit with me, patient's  most recent visit with cardiology,  nephrology,  and neurology,  recent surgical and non surgical procedures, previous  labs and imaging studies, counseling on currently addressed issues,  and post visit ordering to diagnostics and therapeutics .   Follow-up: No follow-ups on file.   Crecencio Mc, MD

## 2022-12-16 ENCOUNTER — Other Ambulatory Visit: Payer: Self-pay | Admitting: Internal Medicine

## 2022-12-16 ENCOUNTER — Encounter: Payer: Self-pay | Admitting: Internal Medicine

## 2022-12-16 ENCOUNTER — Telehealth: Payer: Self-pay

## 2022-12-16 DIAGNOSIS — M5412 Radiculopathy, cervical region: Secondary | ICD-10-CM | POA: Insufficient documentation

## 2022-12-16 MED ORDER — LOSARTAN POTASSIUM 100 MG PO TABS: 100.0000 mg | ORAL_TABLET | Freq: Every day | ORAL | 3 refills | Status: DC

## 2022-12-16 NOTE — Assessment & Plan Note (Signed)
Discussed with patient though I do feel that there is still significant weakness of the feet.  Patient states that this has been going on longer than the back.  Patient would not like to make any changes.  Encouraged patient to continue to work on the balance and coordination in the aquatic therapy to help him decrease the amount of frequent falls.  Follow-up with me again in 3 tmonths otherwise.

## 2022-12-16 NOTE — Telephone Encounter (Signed)
Contacted patient to schedule his colonoscopy.  He said he would like to delay scheduling at this time due to he has shoulder surgery scheduled, and his wife is having abdominal surgery afterwards.  Informed him that I will send him a reminder letter to schedule his colonoscopy.  Thanks,  [place], [place]

## 2022-12-16 NOTE — Assessment & Plan Note (Addendum)
Given the changes noted on recent  MRI ordered by Dr Tamala Julian for evaluation of persistent neck and left  shoulder pain and neck pain , I am concerned that his frequent falls are due in part to myelopathy , . I have attempted to refer him to  Neurology on 2 separate occasions for EMG/nerve conduction studies to rule out myelopathy .but he has not returned the calls from [facility]  for unclear reasons.

## 2022-12-20 NOTE — Patient Instructions (Signed)
DUE TO COVID-19 ONLY TWO VISITORS  (aged 68 and older)  ARE ALLOWED TO COME WITH YOU AND STAY IN THE WAITING ROOM ONLY DURING PRE OP AND PROCEDURE.   **NO VISITORS ARE ALLOWED IN THE SHORT STAY AREA OR RECOVERY ROOM!!**  IF YOU WILL BE ADMITTED INTO THE HOSPITAL YOU ARE ALLOWED ONLY FOUR SUPPORT PEOPLE DURING VISITATION HOURS ONLY (7 AM -8PM)   The support person(s) must pass our screening, gel in and out, and wear a mask at all times, including in the patient's room. Patients must also wear a mask when staff or their support person are in the room. Visitors GUEST BADGE MUST BE WORN VISIBLY  One adult visitor may remain with you overnight and MUST be in the room by 8 P.M.     Your procedure is scheduled on: 12/30/22   Report to [facility] Main Entrance    Report to admitting at : 5:15 AM   Call this number if you have problems the morning of surgery [phone]   Do not eat food :After Midnight.   After Midnight you may have the following liquids until : 4:30 AM DAY OF SURGERY  Water Black Coffee (sugar ok, NO MILK/CREAM OR CREAMERS)  Tea (sugar ok, NO MILK/CREAM OR CREAMERS) regular and decaf                             Plain Jell-O (NO RED)                                           Fruit ices (not with fruit pulp, NO RED)                                     Popsicles (NO RED)                                                                  Juice: apple, WHITE grape, WHITE cranberry Sports drinks like Gatorade (NO RED)               The day of surgery:  Drink ONE (1) Pre-Surgery Clear G2 at : 4:30 AM the morning of surgery. Drink in one sitting. Do not sip.  This drink was given to you during your hospital  pre-op appointment visit. Nothing else to drink after completing the  Pre-Surgery Clear Ensure or G2.          If you have questions, please contact your surgeon's office.  Oral Hygiene is also important to reduce your risk of infection.                                     Remember - BRUSH YOUR TEETH THE MORNING OF SURGERY WITH YOUR REGULAR TOOTHPASTE  DENTURES WILL BE REMOVED PRIOR TO SURGERY PLEASE DO NOT APPLY "Poly grip" OR ADHESIVES!!!   Do NOT smoke after Midnight   Take these medicines the morning of surgery with A  SIP OF WATER: amiodarone,gabapentin,bupropion,metoprolol,propranolol,synthroid,sildenafil,tadalafil. DO NOT TAKE ANY ORAL DIABETIC MEDICATIONS DAY OF YOUR SURGERY  Bring CPAP mask and tubing day of surgery.                              You may not have any metal on your body including hair pins, jewelry, and body piercing             Do not wear lotions, powders, perfumes/cologne, or deodorant              Men may shave face and neck.   Do not bring valuables to the hospital. [facility].   Contacts, glasses, or bridgework may not be worn into surgery.   Bring small overnight bag day of surgery.   DO NOT [place]. PHARMACY WILL DISPENSE MEDICATIONS LISTED ON YOUR MEDICATION LIST TO YOU DURING YOUR ADMISSION [place]!    Patients discharged on the day of surgery will not be allowed to drive home.  Someone NEEDS to stay with you for the first 24 hours after anesthesia.   Special Instructions: Bring a copy of your healthcare power of attorney and living will documents         the day of surgery if you haven't scanned them before.              Please read over the following fact sheets you were given: IF YOU HAVE QUESTIONS ABOUT YOUR PRE-OP INSTRUCTIONS PLEASE CALL [phone]    [facility] - Preparing for Surgery Before surgery, you can play an important role.  Because skin is not sterile, your skin needs to be as free of germs as possible.  You can reduce the number of germs on your skin by washing with CHG (chlorahexidine gluconate) soap before surgery.  CHG is an antiseptic cleaner which kills germs and bonds with the skin to  continue killing germs even after washing. Please DO NOT use if you have an allergy to CHG or antibacterial soaps.  If your skin becomes reddened/irritated stop using the CHG and inform your nurse when you arrive at Short Stay. Do not shave (including legs and underarms) for at least 48 hours prior to the first CHG shower.  You may shave your face/neck. Please follow these instructions carefully:  1.  Shower with CHG Soap the night before surgery and the  morning of Surgery.  2.  If you choose to wash your hair, wash your hair first as usual with your  normal  shampoo.  3.  After you shampoo, rinse your hair and body thoroughly to remove the  shampoo.                           4.  Use CHG as you would any other liquid soap.  You can apply chg directly  to the skin and wash                       Gently with a scrungie or clean washcloth.  5.  Apply the CHG Soap to your body ONLY FROM THE NECK DOWN.   Do not use on face/ open  Wound or open sores. Avoid contact with eyes, ears mouth and genitals (private parts).                       Wash face,  Genitals (private parts) with your normal soap.             6.  Wash thoroughly, paying special attention to the area where your surgery  will be performed.  7.  Thoroughly rinse your body with warm water from the neck down.  8.  DO NOT shower/wash with your normal soap after using and rinsing off  the CHG Soap.                9.  Pat yourself dry with a clean towel.            10.  Wear clean pajamas.            11.  Place clean sheets on your bed the night of your first shower and do not  sleep with pets. Day of Surgery : Do not apply any lotions/deodorants the morning of surgery.  Please wear clean clothes to the hospital/surgery center.  FAILURE TO FOLLOW THESE INSTRUCTIONS MAY RESULT IN THE CANCELLATION OF YOUR SURGERY PATIENT SIGNATURE_________________________________  NURSE  SIGNATURE__________________________________  ________________________________________________________________________[facility]- Preparing for Total Shoulder Arthroplasty    Before surgery, you can play an important role. Because skin is not sterile, your skin needs to be as free of germs as possible. You can reduce the number of germs on your skin by using the following products. Benzoyl Peroxide Gel Reduces the number of germs present on the skin Applied twice a day to shoulder area starting two days before surgery    ==================================================================  Please follow these instructions carefully:  BENZOYL PEROXIDE 5% GEL  Please do not use if you have an allergy to benzoyl peroxide.   If your skin becomes reddened/irritated stop using the benzoyl peroxide.  Starting two days before surgery, apply as follows: Apply benzoyl peroxide in the morning and at night. Apply after taking a shower. If you are not taking a shower clean entire shoulder front, back, and side along with the armpit with a clean wet washcloth.  Place a quarter-sized dollop on your shoulder and rub in thoroughly, making sure to cover the front, back, and side of your shoulder, along with the armpit.   2 days before ____ AM   ____ PM              1 day before ____ AM   ____ PM                         Do this twice a day for two days.  (Last application is the night before surgery, AFTER using the CHG soap as described below).  Do NOT apply benzoyl peroxide gel on the day of surgery.  Incentive Spirometer  An incentive spirometer is a tool that can help keep your lungs clear and active. This tool measures how well you are filling your lungs with each breath. Taking long deep breaths may help reverse or decrease the chance of developing breathing (pulmonary) problems (especially infection) following: A long period of time when you are unable to move or be active. BEFORE THE PROCEDURE   If the spirometer includes an indicator to show your best effort, your nurse or respiratory therapist will set it to a desired goal. If possible, sit up  straight or lean slightly forward. Try not to slouch. Hold the incentive spirometer in an upright position. INSTRUCTIONS FOR USE  Sit on the edge of your bed if possible, or sit up as far as you can in bed or on a chair. Hold the incentive spirometer in an upright position. Breathe out normally. Place the mouthpiece in your mouth and seal your lips tightly around it. Breathe in slowly and as deeply as possible, raising the piston or the ball toward the top of the column. Hold your breath for 3-5 seconds or for as long as possible. Allow the piston or ball to fall to the bottom of the column. Remove the mouthpiece from your mouth and breathe out normally. Rest for a few seconds and repeat Steps 1 through 7 at least 10 times every 1-2 hours when you are awake. Take your time and take a few normal breaths between deep breaths. The spirometer may include an indicator to show your best effort. Use the indicator as a goal to work toward during each repetition. After each set of 10 deep breaths, practice coughing to be sure your lungs are clear. If you have an incision (the cut made at the time of surgery), support your incision when coughing by placing a pillow or rolled up towels firmly against it. Once you are able to get out of bed, walk around indoors and cough well. You may stop using the incentive spirometer when instructed by your caregiver.  RISKS AND COMPLICATIONS Take your time so you do not get dizzy or light-headed. If you are in pain, you may need to take or ask for pain medication before doing incentive spirometry. It is harder to take a deep breath if you are having pain. AFTER USE Rest and breathe slowly and easily. It can be helpful to keep track of a log of your progress. Your caregiver can provide you with a simple table to help  with this. If you are using the spirometer at home, follow these instructions: [place] IF:  You are having difficultly using the spirometer. You have trouble using the spirometer as often as instructed. Your pain medication is not giving enough relief while using the spirometer. You develop fever of 100.5 F (38.1 C) or higher. SEEK IMMEDIATE MEDICAL CARE IF:  You cough up bloody sputum that had not been present before. You develop fever of 102 F (38.9 C) or greater. You develop worsening pain at or near the incision site. MAKE SURE YOU:  Understand these instructions. Will watch your condition. Will get help right away if you are not doing well or get worse. Document Released: 02/07/2007 Document Revised: 12/20/2011 Document Reviewed: 04/10/2007 [facility] Patient Information 2014 [place], [place].   ________________________________________________________________________

## 2022-12-21 ENCOUNTER — Telehealth: Payer: Self-pay

## 2022-12-21 MED ORDER — HYDROCODONE-ACETAMINOPHEN 10-325 MG PO TABS: 1.0000 | ORAL_TABLET | Freq: Three times a day (TID) | ORAL | 0 refills | Status: DC | PRN

## 2022-12-21 NOTE — Telephone Encounter (Signed)
Prescription Request  12/21/2022  LOV: Visit date not found  What is the name of the medication or equipment? HYDROcodone-acetaminophen (NORCO) 10-325 MG tablet  Have you contacted your pharmacy to request a refill? Yes   Which pharmacy would you like this sent to?  [place], [place] - [address] [place] [address] Phone: [phone] Fax: [phone]    Patient notified that their request is being sent to the clinical staff for review and that they should receive a response within 2 business days.   Please advise at [place] [phone] (wife's number)  Patient's wife, Chapin Brester, called to state both she and patient were supposed to have a refill for this medication.  Curt Bears states she received her refill, but patient has not received his.  Curt Bears states patient is out of this medication.  Curt Bears states they are at the pharmacy now and they are willing to fill it while they are there if we can call it in.  I let her know that it usually takes a couple of days to process the prescription but I will check to see if I can reach Adair Laundry, [place].  I was unable to reach [place] at this time, so I let patient know so that I will send a note to her.  I let patient know that they are seeing patients at this time, so they may not be able to send the refill information while they are at the pharmacy today.

## 2022-12-21 NOTE — Telephone Encounter (Signed)
Rx did not get sent in at his appt.

## 2022-12-21 NOTE — Telephone Encounter (Signed)
Pt is aware.  

## 2022-12-21 NOTE — Telephone Encounter (Signed)
Hydrocodone refill sent

## 2022-12-22 ENCOUNTER — Encounter (HOSPITAL_COMMUNITY): Payer: Self-pay

## 2022-12-22 ENCOUNTER — Encounter (HOSPITAL_COMMUNITY)
Admission: RE | Admit: 2022-12-22 | Discharge: 2022-12-22 | Disposition: A | Discharge status: Home / Self Care | Payer: PPO | Source: Ambulatory Visit | Attending: Orthopedic Surgery | Admitting: Orthopedic Surgery

## 2022-12-22 ENCOUNTER — Ambulatory Visit (HOSPITAL_COMMUNITY)
Admission: RE | Admit: 2022-12-22 | Discharge: 2022-12-22 | Disposition: A | Discharge status: Home / Self Care | Payer: PPO | Source: Ambulatory Visit | Attending: Orthopedic Surgery | Admitting: Orthopedic Surgery

## 2022-12-22 ENCOUNTER — Other Ambulatory Visit: Payer: Self-pay

## 2022-12-22 VITALS — BP 136/83 | HR 51 | Temp 99.2°F | Ht 74.0 in | Wt 366.0 lb

## 2022-12-22 DIAGNOSIS — Z01818 Encounter for other preprocedural examination: Secondary | ICD-10-CM | POA: Insufficient documentation

## 2022-12-22 DIAGNOSIS — I10 Essential (primary) hypertension: Secondary | ICD-10-CM

## 2022-12-22 DIAGNOSIS — R7303 Prediabetes: Secondary | ICD-10-CM | POA: Insufficient documentation

## 2022-12-22 HISTORY — DX: Cardiac arrhythmia, unspecified: I49.9

## 2022-12-22 HISTORY — DX: Other complications of anesthesia, initial encounter: T88.59XA

## 2022-12-22 HISTORY — DX: Prediabetes: R73.03

## 2022-12-22 LAB — CBC
HCT: 43.1 % (ref 39.0–52.0)
Hemoglobin: 13.9 g/dL (ref 13.0–17.0)
MCH: 30.5 pg (ref 26.0–34.0)
MCHC: 32.3 g/dL (ref 30.0–36.0)
MCV: 94.7 fL (ref 80.0–100.0)
Platelets: 148 10*3/uL — ABNORMAL LOW (ref 150–400)
RBC: 4.55 MIL/uL (ref 4.22–5.81)
RDW: 14.3 % (ref 11.5–15.5)
WBC: 6.4 10*3/uL (ref 4.0–10.5)
nRBC: 0 % (ref 0.0–0.2)

## 2022-12-22 LAB — BASIC METABOLIC PANEL
Anion gap: 7 (ref 5–15)
BUN: 20 mg/dL (ref 8–23)
CO2: 25 mmol/L (ref 22–32)
Calcium: 8.3 mg/dL — ABNORMAL LOW (ref 8.9–10.3)
Chloride: 104 mmol/L (ref 98–111)
Creatinine, Ser: 0.77 mg/dL (ref 0.61–1.24)
GFR, Estimated: >60 mL/min (ref >60)
Glucose, Bld: 106 mg/dL — ABNORMAL HIGH (ref 70–99)
Potassium: 4 mmol/L (ref 3.5–5.1)
Sodium: 136 mmol/L (ref 135–145)

## 2022-12-22 LAB — SURGICAL PCR SCREEN
MRSA, PCR: NEGATIVE
Staphylococcus aureus: NEGATIVE

## 2022-12-22 LAB — HEMOGLOBIN A1C
Hgb A1c MFr Bld: 5.6 % (ref 4.8–5.6)
Mean Plasma Glucose: 114 mg/dL

## 2022-12-22 LAB — GLUCOSE, CAPILLARY: Glucose-Capillary: 119 mg/dL — ABNORMAL HIGH (ref 70–99)

## 2022-12-22 NOTE — Progress Notes (Addendum)
For Short Stay: [place] appointment date:  Bowel Prep reminder:   For Anesthesia: PCP - Dr. Deborra Medina Cardiologist - Dr. Ida Rogue. LOV: 09/21/22 Clearance: Ambrose Pancoast: NP: 11/24/22 Chest x-ray -  EKG - 09/21/22 Stress Test -  ECHO - 04/18/15 Cardiac Cath -  Pacemaker/ICD device last checked: Pacemaker orders received: Device Rep notified:  Spinal Cord Stimulator:  Sleep Study - Yes CPAP - NO  Fasting Blood Sugar - N/A Checks Blood Sugar _____ times a day Date and result of last Hgb A1c-  Last dose of GLP1 agonist-  GLP1 instructions:   Last dose of SGLT-2 inhibitors-  SGLT-2 instructions:   Blood Thinner Instructions: Coumadin: can be hold for 5 days as per Ambrose Pancoast: NP.,not bridge needed.Pt. has an appointment with surgeon tomorrow: 12/23/22. Aspirin Instructions: Last Dose:  Activity level: Can go up a flight of stairs and activities of daily living without stopping and without chest pain and/or shortness of breath   Able to exercise without chest pain and/or shortness of breath.Limited mobility due to generalized pain.  Anesthesia review: Hx: DVT,CHF,HTN,PAF,OSA(NO CPAP)  Patient denies shortness of breath, fever, cough and chest pain at PAT appointment   Patient verbalized understanding of instructions that were given to them at the PAT appointment. Patient was also instructed that they will need to review over the PAT instructions again at home before surgery.

## 2022-12-23 ENCOUNTER — Ambulatory Visit: Payer: PPO | Attending: Nurse Practitioner | Admitting: Nurse Practitioner

## 2022-12-23 ENCOUNTER — Telehealth: Payer: Self-pay | Admitting: *Deleted

## 2022-12-23 DIAGNOSIS — Z01810 Encounter for preprocedural cardiovascular examination: Secondary | ICD-10-CM

## 2022-12-23 NOTE — Telephone Encounter (Signed)
I am reviewed the chart and see that the pt's surgery is planned for 12/30/22. Pt agreeable to add on tele appt today per Diona Browner, NP 3:30. Med rec and consent are done.      Patient Consent for Virtual Visit        [name] has provided verbal consent on 12/23/2022 for a virtual visit (video or telephone).   CONSENT FOR VIRTUAL VISIT FOR:  Johnathan [name]  By participating in this virtual visit I agree to the following:  I hereby voluntarily request, consent and authorize [place] and its employed or contracted physicians, physician assistants, nurse practitioners or other licensed health care professionals (the Practitioner), to provide me with telemedicine health care services (the "Services") as deemed necessary by the treating Practitioner. I acknowledge and consent to receive the Services by the Practitioner via telemedicine. I understand that the telemedicine visit will involve communicating with the Practitioner through live audiovisual communication technology and the disclosure of certain medical information by electronic transmission. I acknowledge that I have been given the opportunity to request an in-person assessment or other available alternative prior to the telemedicine visit and am voluntarily participating in the telemedicine visit.  I understand that I have the right to withhold or withdraw my consent to the use of telemedicine in the course of my care at any time, without affecting my right to future care or treatment, and that the Practitioner or I may terminate the telemedicine visit at any time. I understand that I have the right to inspect all information obtained and/or recorded in the course of the telemedicine visit and may receive copies of available information for a reasonable fee.  I understand that some of the potential risks of receiving the Services via telemedicine include:  Delay or interruption in medical evaluation due to technological  equipment failure or disruption; Information transmitted may not be sufficient (e.g. poor resolution of images) to allow for appropriate medical decision making by the Practitioner; and/or  In rare instances, security protocols could fail, causing a breach of personal health information.  Furthermore, I acknowledge that it is my responsibility to provide information about my medical history, conditions and care that is complete and accurate to the best of my ability. I acknowledge that Practitioner's advice, recommendations, and/or decision may be based on factors not within their control, such as incomplete or inaccurate data provided by me or distortions of diagnostic images or specimens that may result from electronic transmissions. I understand that the practice of medicine is not an exact science and that Practitioner makes no warranties or guarantees regarding treatment outcomes. I acknowledge that a copy of this consent can be made available to me via my patient portal ([facility]), or I can request a printed copy by calling the office of [place].    I understand that my insurance will be billed for this visit.   I have read or had this consent read to me. I understand the contents of this consent, which adequately explains the benefits and risks of the Services being provided via telemedicine.  I have been provided ample opportunity to ask questions regarding this consent and the Services and have had my questions answered to my satisfaction. I give my informed consent for the services to be provided through the use of telemedicine in my medical care

## 2022-12-23 NOTE — Progress Notes (Signed)
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 never smoker with h/o HTN, DVT, OSA, PAF (on Coumadin), CHF, right shoulder rotator cuff tear scheduled for above procedure 12/30/2022 with Dr. Tania Ade.   Pt seen by cardiology 12/23/2022. Per OV note, "According to the Revised Cardiac Risk Index (RCRI), his Perioperative Risk of Major Cardiac Event is (%): 0.9. His Functional Capacity in METs is: 6.36 according to the Duke Activity Status Index (DASI). Therefore, based on ACC/AHA guidelines, patient would be at acceptable risk for the planned procedure without further cardiovascular testing.   The patient was advised that if he develops new symptoms prior to surgery to contact our office to arrange for a follow-up visit, and he verbalized understanding.   Per office protocol, patient can hold warfarin for 5 days prior to procedure.   Patient will not need bridging with Lovenox (enoxaparin) around procedure."  Clearance received from PCP which states pt is moderate risk for planned procedure, untreated OSA (patient declines, atrial fibrillation, chronic opioid use.)  Anticipate pt can proceed with planned procedure barring acute status change.   VS: BP 136/83   Pulse (!) 51   Temp 37.3 C (Oral)   Ht 6\' 2"  (1.88 m)   Wt (!) 166 kg   SpO2 95%   BMI 46.99 kg/m   PROVIDERS: Crecencio Mc, MD is PCP   Cardiologist - Dr. Ida Rogue  LABS: Labs reviewed: Acceptable for surgery. (all labs ordered are listed, but only abnormal results are displayed)  Labs Reviewed  BASIC METABOLIC PANEL - Abnormal; Notable for the following components:      Result Value   Glucose, Bld 106 (*)    Calcium 8.3 (*)     All other components within normal limits  GLUCOSE, CAPILLARY - Abnormal; Notable for the following components:   Glucose-Capillary 119 (*)    All other components within normal limits  CBC - Abnormal; Notable for the following components:   Platelets 148 (*)    All other components within normal limits  SURGICAL PCR SCREEN  HEMOGLOBIN A1C     IMAGES:   EKG:   CV: Echo 04/18/2015 - Left ventricle: The cavity size was normal. There was mild    concentric hypertrophy. Systolic function was mildly to    moderately reduced. The estimated ejection fraction was in the    range of 40% to 45%. Images were inadequate for LV wall motion    assessment.  - Mitral valve: There was mild regurgitation.  - Left atrium: The atrium was severely dilated.  - Pulmonary arteries: Systolic pressure was within the normal    range.  Past Medical History:  Diagnosis Date   Arthritis    a. chronic joint pain   B12 deficiency    Chronic systolic CHF (congestive heart failure) ([place])    a. echo 2013: EF of 50-55%, normal right ventricular systolic pressure, normal left atrium; b. EF 40-45%, inadequate for LV wall motion, mild MR, LA severely dilated @ 52 mm, PASP nl   Complication of anesthesia    Dysrhythmia    Erythema migrans (Lyme disease) 04/08/2015   History of DVT (deep vein thrombosis) 2004   s/p bariatric surgery  History of stress test    a. there was no ST segment deviation noted during stress,    There is a small defect of moderate severity present in the apex location, suggestive of apical ischemia, intermediate risk, calculated EF 21% but visually appears to be 35-40%   Hypertension    Hypothyroidism    Neuropathy    Obesity    OSA (obstructive sleep apnea)    a. not compliant with CPAP   PAF (paroxysmal atrial fibrillation) ([place])    a. CHADSVASc at least 2 (HTN and vascular disease); b. on Eliquis    Pre-diabetes     Past Surgical History:  Procedure Laterality Date    ARTHROSCOPIC REPAIR ACL Bilateral 1993   bilateral knee x2 on left and x2 on right   BARIATRIC SURGERY  2004   [place] Y    [place] STUDY N/A 06/20/2015   Procedure: CARDIOVERSION;  Surgeon: Minna Merritts, MD;  Location: [facility] ORS;  Service: Cardiovascular;  Laterality: N/A;   KNEE ARTHROPLASTY Right 06/20/2017   Procedure: COMPUTER ASSISTED TOTAL KNEE ARTHROPLASTY;  Surgeon: Dereck Leep, MD;  Location: [facility] ORS;  Service: Orthopedics;  Laterality: Right;   MENISCUS DEBRIDEMENT Left 2011   Left knee   REPLACEMENT TOTAL KNEE Left 07/2013   TONSILLECTOMY      MEDICATIONS:  amiodarone (PACERONE) 200 MG tablet   B Complex Vitamins (B COMPLEX 50 PO)   buPROPion (WELLBUTRIN XL) 300 MG 24 hr tablet   Cholecalciferol (VITAMIN D3) 2000 UNITS TABS   docusate sodium (COLACE) 100 MG capsule   Ferrous Gluconate 324 (37.5 Fe) MG TABS   furosemide (LASIX) 40 MG tablet   gabapentin (NEURONTIN) 300 MG capsule   GLUCOSAMINE HCL-MSM PO   hydrochlorothiazide (HYDRODIURIL) 25 MG tablet   HYDROcodone-acetaminophen (NORCO) 10-325 MG tablet   HYDROcodone-acetaminophen (NORCO) 10-325 MG tablet   HYDROcodone-acetaminophen (NORCO/VICODIN) 5-325 MG tablet   levothyroxine (SYNTHROID) 25 MCG tablet   losartan (COZAAR) 100 MG tablet   metoprolol succinate (TOPROL-XL) 50 MG 24 hr tablet   Multiple Vitamin (MULTIVITAMIN) tablet   Potassium Chloride ER 20 MEQ TBCR   propranolol (INDERAL) 20 MG tablet   rosuvastatin (CRESTOR) 10 MG tablet   sildenafil (REVATIO) 20 MG tablet   tadalafil (CIALIS) 20 MG tablet   venlafaxine XR (EFFEXOR-XR) 150 MG 24 hr capsule   vitamin B-12 (CYANOCOBALAMIN) 500 MCG tablet   vitamin E 400 UNIT capsule   warfarin (COUMADIN) 5 MG tablet   No current facility-administered medications for this encounter.    Konrad Felix Ward, PA-C [facility]-Surgical Testing [phone]

## 2022-12-23 NOTE — Progress Notes (Signed)
Virtual Visit via Telephone Note   Because of Johnathan [name]'s co-morbid illnesses, he is at least at moderate risk for complications without adequate follow up.  This format is felt to be most appropriate for this patient at this time.  The patient did not have access to video technology/had technical difficulties with video requiring transitioning to audio format only (telephone).  All issues noted in this document were discussed and addressed.  No physical exam could be performed with this format.  Please refer to the patient's chart for his consent to telehealth for [facility].  Evaluation Performed:  Preoperative cardiovascular risk assessment _____________   Date:  12/23/2022   Patient ID:  Johnathan [name], DOB 12/21/1955, MRN [id number] Patient Location:  Home Provider location:   Office  Primary Care Provider:  Crecencio Mc, MD Primary Cardiologist:  None  Chief Complaint / Patient Profile   67 y.o. y/o male with a h/o paroxysmal atrial fibrillation, chronic combined systolic and diastolic heart failure, hypertension, chronic pain, and obesity who is pending R reverse total shoulder arthroplasty on 12/30/2022 with Dr. Tania Ade of [place] and presents today for telephonic preoperative cardiovascular risk assessment.  History of Present Illness    Johnathan [name] is a 67 y.o. male who presents via audio/video conferencing for a telehealth visit today.  Pt was last seen in cardiology clinic on 09/21/2022 by Dr. Rockey Situ. At that time Johnathan [name] was doing well. The patient is now pending procedure as outlined above. Since his last visit, he has done well from a cardiac standpoint.   He denies chest pain, palpitations, dyspnea, pnd, orthopnea, n, v, dizziness, syncope, edema, weight gain, or early satiety. All other systems reviewed and are otherwise negative except as noted above.   Past Medical History    Past Medical History:  Diagnosis  Date   Arthritis    a. chronic joint pain   B12 deficiency    Chronic systolic CHF (congestive heart failure) ([place])    a. echo 2013: EF of 50-55%, normal right ventricular systolic pressure, normal left atrium; b. EF 40-45%, inadequate for LV wall motion, mild MR, LA severely dilated @ 52 mm, PASP nl   Complication of anesthesia    Dysrhythmia    Erythema migrans (Lyme disease) 04/08/2015   History of DVT (deep vein thrombosis) 2004   s/p bariatric surgery   History of stress test    a. there was no ST segment deviation noted during stress,    There is a small defect of moderate severity present in the apex location, suggestive of apical ischemia, intermediate risk, calculated EF 21% but visually appears to be 35-40%   Hypertension    Hypothyroidism    Neuropathy    Obesity    OSA (obstructive sleep apnea)    a. not compliant with CPAP   PAF (paroxysmal atrial fibrillation) ([place])    a. CHADSVASc at least 2 (HTN and vascular disease); b. on Eliquis    Pre-diabetes    Past Surgical History:  Procedure Laterality Date   ARTHROSCOPIC REPAIR ACL Bilateral 1993   bilateral knee x2 on left and x2 on right   BARIATRIC SURGERY  2004   [place]    [place] STUDY N/A 06/20/2015   Procedure: CARDIOVERSION;  Surgeon: Minna Merritts, MD;  Location: [facility] ORS;  Service: Cardiovascular;  Laterality: N/A;   KNEE ARTHROPLASTY Right 06/20/2017  Procedure: COMPUTER ASSISTED TOTAL KNEE ARTHROPLASTY;  Surgeon: Dereck Leep, MD;  Location: [facility] ORS;  Service: Orthopedics;  Laterality: Right;   MENISCUS DEBRIDEMENT Left 2011   Left knee   REPLACEMENT TOTAL KNEE Left 07/2013   TONSILLECTOMY      Allergies  Allergies  Allergen Reactions   Eliquis [Apixaban] Itching    "Red spots" and severe itching   Xarelto [Rivaroxaban] Hives    Reaction in 2013    Home Medications    Prior to Admission medications   Medication Sig Start Date  End Date Taking? Authorizing Provider  amiodarone (PACERONE) 200 MG tablet Take 1 tablet (200 mg total) by mouth 2 (two) times daily. 05/19/22   Minna Merritts, MD  B Complex Vitamins (B COMPLEX 50 PO) Take 1 tablet by mouth daily.     [provider]  buPROPion (WELLBUTRIN XL) 300 MG 24 hr tablet Take 1 tablet (300 mg total) by mouth daily. 11/23/21   Crecencio Mc, MD  Cholecalciferol (VITAMIN D3) 2000 UNITS TABS Take 2,000 Units by mouth daily.    [provider]  docusate sodium (COLACE) 100 MG capsule Take 400 mg by mouth daily.     [provider]  Ferrous Gluconate 324 (37.5 Fe) MG TABS Take 1 tablet by mouth daily.     [provider]  furosemide (LASIX) 40 MG tablet Take 1 tablet (40 mg total) by mouth daily as needed. 09/15/22   Crecencio Mc, MD  gabapentin (NEURONTIN) 300 MG capsule TAKE 1 CAPSULE BY MOUTH THREE TIMES A DAY 09/23/22   Lyndal Pulley, DO  GLUCOSAMINE HCL-MSM PO Take 1 tablet by mouth daily.     [provider]  hydrochlorothiazide (HYDRODIURIL) 25 MG tablet Take 1 tablet by mouth once daily 12/16/22   Crecencio Mc, MD  HYDROcodone-acetaminophen (NORCO) 10-325 MG tablet Take 1 tablet by mouth every 8 (eight) hours as needed. Patient not taking: Reported on 12/21/2022 06/16/22   Crecencio Mc, MD  HYDROcodone-acetaminophen [facility]) 10-325 MG tablet Take 1 tablet by mouth every 8 (eight) hours as needed. 12/21/22   Crecencio Mc, MD  HYDROcodone-acetaminophen (NORCO/VICODIN) 5-325 MG tablet Take 2 tablets by mouth every 6 (six) hours as needed. Patient not taking: Reported on 12/21/2022 07/23/22   Crecencio Mc, MD  levothyroxine (SYNTHROID) 25 MCG tablet Take 1 tablet (25 mcg total) by mouth daily before breakfast. 04/07/22   Crecencio Mc, MD  losartan (COZAAR) 100 MG tablet Take 1 tablet (100 mg total) by mouth daily. 12/16/22   Crecencio Mc, MD  metoprolol succinate (TOPROL-XL) 50 MG 24 hr tablet TAKE 1 TABLET BY  MOUTH DAILY. 09/15/22   Crecencio Mc, MD  Multiple Vitamin (MULTIVITAMIN) tablet Take 1 tablet by mouth daily.    [provider]  Potassium Chloride ER 20 MEQ TBCR Take 20 mEq by mouth 2 (two) times daily as needed. 09/21/22   Minna Merritts, MD  propranolol (INDERAL) 20 MG tablet Take 1 tablet (20 mg total) by mouth 3 (three) times daily as needed. Reported on 03/22/2016 09/21/22   Minna Merritts, MD  rosuvastatin (CRESTOR) 10 MG tablet TAKE 1 TABLET (10 MG TOTAL) BY MOUTH DAILY AT 2 PM. 03/16/22   Crecencio Mc, MD  sildenafil (REVATIO) 20 MG tablet Take 1 tablet (20 mg total) by mouth 3 (three) times daily. 03/19/22   Crecencio Mc, MD  tadalafil (CIALIS) 20 MG tablet Take 1 tablet (20  mg total) by mouth daily as needed for erectile dysfunction. 04/26/22   Zara Council A, PA-C  venlafaxine XR (EFFEXOR-XR) 150 MG 24 hr capsule Take 1 capsule (150 mg total) by mouth daily with breakfast. 07/12/22   Crecencio Mc, MD  vitamin B-12 (CYANOCOBALAMIN) 500 MCG tablet Take 500 mcg by mouth daily.    [provider]  vitamin E 400 UNIT capsule Take 400 Units by mouth daily.    [provider]  warfarin (COUMADIN) 5 MG tablet TAKE 1 TABLET TO 1.5 TABLETS BY MOUTH DAILY AS DIRECTED BY ANTI-COAG CLINIC Patient taking differently: Take 5-7.5 mg by mouth See admin instructions. Take 7.5 mg by mouth on Monday, Wednesday and Friday and take 5 mg on Tuesday, Thursday, Saturday and Sunday. 11/23/22   Minna Merritts, MD    Physical Exam    Vital Signs:  Johnathan [name] does not have vital signs available for review today.  Given telephonic nature of communication, physical exam is limited. AAOx3. NAD. Normal affect.  Speech and respirations are unlabored.  Accessory Clinical Findings    None  Assessment & Plan    1.  Preoperative Cardiovascular Risk Assessment:  According to the Revised Cardiac Risk Index (RCRI), his Perioperative Risk of Major Cardiac Event is  (%): 0.9. His Functional Capacity in METs is: 6.36 according to the Duke Activity Status Index (DASI). Therefore, based on ACC/AHA guidelines, patient would be at acceptable risk for the planned procedure without further cardiovascular testing.  The patient was advised that if he develops new symptoms prior to surgery to contact our office to arrange for a follow-up visit, and he verbalized understanding.  Per office protocol, patient can hold warfarin for 5 days prior to procedure.   Patient will not need bridging with Lovenox (enoxaparin) around procedure.   Pt has INR checks in [facility] coumadin clinic.  He will need to update clinic once date of procedure is known.   A copy of this note will be routed to requesting surgeon.  Time:   Today, I have spent 5 minutes with the patient with telehealth technology discussing medical history, symptoms, and management plan.     Lenna Sciara, NP  12/23/2022, 3:52 PM

## 2022-12-23 NOTE — Telephone Encounter (Signed)
I am reviewed the chart and see that the pt's surgery is planned for 12/30/22. Pt agreeable to add on tele appt today per Diona Browner, NP 3:30. Med rec and consent are done.

## 2022-12-23 NOTE — Telephone Encounter (Signed)
   Name: Johnathan [name]  DOB: 1956-03-31  MRN: [id number]  Primary Cardiologist: None   Preoperative team, please contact this patient and set up a phone call appointment for further preoperative risk assessment. Please obtain consent and complete medication review. Thank you for your help.  I confirm that guidance regarding antiplatelet and oral anticoagulation therapy has been completed and, if necessary, noted below.  Per office protocol, patient can hold warfarin for 5 days prior to procedure.   Patient will not need bridging with Lovenox (enoxaparin) around procedure.   Pt has INR checks in [facility] coumadin clinic.  He will need to update clinic once date of procedure is known.      Lenna Sciara, NP 12/23/2022, 12:14 PM [name]

## 2022-12-27 NOTE — Anesthesia Preprocedure Evaluation (Signed)
Anesthesia Evaluation  Patient identified by MRN, date of birth, ID band Patient awake    Reviewed: Allergy & Precautions, NPO status , Patient's Chart, lab work & pertinent test results, reviewed documented beta blocker date and time   History of Anesthesia Complications Negative for: history of anesthetic complications  Airway Mallampati: II  TM Distance: >3 FB Neck ROM: Full    Dental  (+) Dental Advisory Given, Chipped   Pulmonary sleep apnea    Pulmonary exam normal        Cardiovascular hypertension, Pt. on medications and Pt. on home beta blockers +CHF and + DVT  Normal cardiovascular exam+ dysrhythmias    '16 TTE - mild concentric LVH. EF 40% to 45%. Mild MR. Left atrium was severely dilated.     Neuro/Psych  PSYCHIATRIC DISORDERS  Depression    negative neurological ROS     GI/Hepatic Neg liver ROS,,, S/p bariatric surgery    Endo/Other  Hypothyroidism  Morbid obesity Pre-DM   Renal/GU negative Renal ROS     Musculoskeletal  (+) Arthritis ,    Abdominal   Peds  Hematology  On coumadin    Anesthesia Other Findings   Reproductive/Obstetrics                             Anesthesia Physical Anesthesia Plan  ASA: 3  Anesthesia Plan: General   Post-op Pain Management: Regional block* and Tylenol PO (pre-op)*   Induction: Intravenous  PONV Risk Score and Plan: 2 and Treatment may vary due to age or medical condition, Ondansetron and Dexamethasone  Airway Management Planned: Oral ETT  Additional Equipment: None  Intra-op Plan:   Post-operative Plan: Extubation in OR  Informed Consent: I have reviewed the patients History and Physical, chart, labs and discussed the procedure including the risks, benefits and alternatives for the proposed anesthesia with the patient or authorized representative who has indicated his/her understanding and acceptance.     Dental  advisory given  Plan Discussed with: CRNA and Anesthesiologist  Anesthesia Plan Comments:        Anesthesia Quick Evaluation

## 2022-12-30 ENCOUNTER — Ambulatory Visit (HOSPITAL_COMMUNITY)
Admission: RE | Admit: 2022-12-30 | Discharge: 2022-12-30 | Disposition: A | Discharge status: Home / Self Care | Payer: PPO | Source: Ambulatory Visit | Attending: Orthopedic Surgery | Admitting: Orthopedic Surgery

## 2022-12-30 ENCOUNTER — Ambulatory Visit (HOSPITAL_COMMUNITY): Payer: PPO | Admitting: Physician Assistant

## 2022-12-30 ENCOUNTER — Other Ambulatory Visit: Payer: Self-pay

## 2022-12-30 ENCOUNTER — Encounter (HOSPITAL_COMMUNITY)
Admission: RE | Disposition: A | Discharge status: Home / Self Care | Payer: Self-pay | Source: Ambulatory Visit | Attending: Orthopedic Surgery

## 2022-12-30 ENCOUNTER — Ambulatory Visit (HOSPITAL_BASED_OUTPATIENT_CLINIC_OR_DEPARTMENT_OTHER): Payer: PPO | Admitting: Anesthesiology

## 2022-12-30 ENCOUNTER — Encounter (HOSPITAL_COMMUNITY): Payer: Self-pay | Admitting: Orthopedic Surgery

## 2022-12-30 DIAGNOSIS — I110 Hypertensive heart disease with heart failure: Secondary | ICD-10-CM

## 2022-12-30 DIAGNOSIS — M75101 Unspecified rotator cuff tear or rupture of right shoulder, not specified as traumatic: Secondary | ICD-10-CM | POA: Insufficient documentation

## 2022-12-30 DIAGNOSIS — I5022 Chronic systolic (congestive) heart failure: Secondary | ICD-10-CM | POA: Insufficient documentation

## 2022-12-30 DIAGNOSIS — M19011 Primary osteoarthritis, right shoulder: Secondary | ICD-10-CM | POA: Insufficient documentation

## 2022-12-30 DIAGNOSIS — I509 Heart failure, unspecified: Secondary | ICD-10-CM | POA: Diagnosis not present

## 2022-12-30 DIAGNOSIS — Z6841 Body Mass Index (BMI) 40.0 and over, adult: Secondary | ICD-10-CM | POA: Insufficient documentation

## 2022-12-30 DIAGNOSIS — G4733 Obstructive sleep apnea (adult) (pediatric): Secondary | ICD-10-CM | POA: Insufficient documentation

## 2022-12-30 DIAGNOSIS — Z9884 Bariatric surgery status: Secondary | ICD-10-CM | POA: Diagnosis not present

## 2022-12-30 DIAGNOSIS — I480 Paroxysmal atrial fibrillation: Secondary | ICD-10-CM | POA: Diagnosis not present

## 2022-12-30 DIAGNOSIS — M12811 Other specific arthropathies, not elsewhere classified, right shoulder: Secondary | ICD-10-CM

## 2022-12-30 DIAGNOSIS — Z86718 Personal history of other venous thrombosis and embolism: Secondary | ICD-10-CM | POA: Insufficient documentation

## 2022-12-30 DIAGNOSIS — M1990 Unspecified osteoarthritis, unspecified site: Secondary | ICD-10-CM | POA: Diagnosis not present

## 2022-12-30 DIAGNOSIS — I10 Essential (primary) hypertension: Secondary | ICD-10-CM

## 2022-12-30 DIAGNOSIS — G4730 Sleep apnea, unspecified: Secondary | ICD-10-CM

## 2022-12-30 DIAGNOSIS — E039 Hypothyroidism, unspecified: Secondary | ICD-10-CM | POA: Diagnosis not present

## 2022-12-30 HISTORY — PX: REVERSE SHOULDER ARTHROPLASTY: SHX5054

## 2022-12-30 LAB — PROTIME-INR
INR: 1.3 — ABNORMAL HIGH (ref 0.8–1.2)
Prothrombin Time: 16.4 seconds — ABNORMAL HIGH (ref 11.4–15.2)

## 2022-12-30 SURGERY — ARTHROPLASTY, SHOULDER, TOTAL, REVERSE
Anesthesia: General | Site: Shoulder | Laterality: Right

## 2022-12-30 MED ORDER — ACETAMINOPHEN 500 MG PO TABS
1000.0000 mg | ORAL_TABLET | Freq: Once | ORAL | Status: DC
  Filled 2022-12-30: qty 2

## 2022-12-30 MED ORDER — LACTATED RINGERS IV SOLN: INTRAVENOUS | Status: DC

## 2022-12-30 MED ORDER — CHLORHEXIDINE GLUCONATE 0.12 % MT SOLN
15.0000 mL | Freq: Once | OROMUCOSAL | Status: AC
  Administered 2022-12-30: 15 mL via OROMUCOSAL

## 2022-12-30 MED ORDER — ONDANSETRON HCL 4 MG/2ML IJ SOLN
INTRAMUSCULAR | Status: AC
  Filled 2022-12-30: qty 2

## 2022-12-30 MED ORDER — ONDANSETRON HCL 4 MG/2ML IJ SOLN: 4.0000 mg | Freq: Once | INTRAMUSCULAR | Status: DC | PRN

## 2022-12-30 MED ORDER — ROCURONIUM BROMIDE 100 MG/10ML IV SOLN
INTRAVENOUS | Status: DC | PRN
  Administered 2022-12-30: 20 mg via INTRAVENOUS
  Administered 2022-12-30: 65 mg via INTRAVENOUS

## 2022-12-30 MED ORDER — BUPIVACAINE HCL (PF) 0.5 % IJ SOLN
INTRAMUSCULAR | Status: DC | PRN
  Administered 2022-12-30: 15 mL via PERINEURAL

## 2022-12-30 MED ORDER — DEXMEDETOMIDINE HCL IN NACL 80 MCG/20ML IV SOLN
INTRAVENOUS | Status: AC
  Filled 2022-12-30: qty 20

## 2022-12-30 MED ORDER — ORAL CARE MOUTH RINSE: 15.0000 mL | Freq: Once | OROMUCOSAL | Status: AC

## 2022-12-30 MED ORDER — TRANEXAMIC ACID-NACL 1000-0.7 MG/100ML-% IV SOLN
1000.0000 mg | INTRAVENOUS | Status: AC
  Administered 2022-12-30: 1000 mg via INTRAVENOUS
  Filled 2022-12-30: qty 100

## 2022-12-30 MED ORDER — CEFAZOLIN IN SODIUM CHLORIDE 3-0.9 GM/100ML-% IV SOLN
3.0000 g | INTRAVENOUS | Status: AC
  Administered 2022-12-30: 3 g via INTRAVENOUS
  Filled 2022-12-30: qty 100

## 2022-12-30 MED ORDER — ONDANSETRON HCL 4 MG/2ML IJ SOLN
INTRAMUSCULAR | Status: DC | PRN
  Administered 2022-12-30: 4 mg via INTRAVENOUS

## 2022-12-30 MED ORDER — FENTANYL CITRATE (PF) 100 MCG/2ML IJ SOLN
INTRAMUSCULAR | Status: AC
  Filled 2022-12-30: qty 2

## 2022-12-30 MED ORDER — FENTANYL CITRATE (PF) 100 MCG/2ML IJ SOLN
INTRAMUSCULAR | Status: DC | PRN
  Administered 2022-12-30: 50 ug via INTRAVENOUS
  Administered 2022-12-30 (×2): 25 ug via INTRAVENOUS

## 2022-12-30 MED ORDER — PROPOFOL 10 MG/ML IV BOLUS
INTRAVENOUS | Status: DC | PRN
  Administered 2022-12-30: 40 mg via INTRAVENOUS
  Administered 2022-12-30: 160 mg via INTRAVENOUS

## 2022-12-30 MED ORDER — EPHEDRINE SULFATE-NACL 50-0.9 MG/10ML-% IV SOSY
PREFILLED_SYRINGE | INTRAVENOUS | Status: DC | PRN
  Administered 2022-12-30 (×2): 10 mg via INTRAVENOUS

## 2022-12-30 MED ORDER — ROCURONIUM BROMIDE 10 MG/ML (PF) SYRINGE
PREFILLED_SYRINGE | INTRAVENOUS | Status: AC
  Filled 2022-12-30: qty 10

## 2022-12-30 MED ORDER — TIZANIDINE HCL 4 MG PO TABS: 4.0000 mg | ORAL_TABLET | Freq: Three times a day (TID) | ORAL | 0 refills | Status: AC | PRN

## 2022-12-30 MED ORDER — FENTANYL CITRATE PF 50 MCG/ML IJ SOSY: 25.0000 ug | PREFILLED_SYRINGE | INTRAMUSCULAR | Status: DC | PRN

## 2022-12-30 MED ORDER — WATER FOR IRRIGATION, STERILE IR SOLN
Status: DC | PRN
  Administered 2022-12-30: 1000 mL

## 2022-12-30 MED ORDER — MIDAZOLAM HCL 5 MG/5ML IJ SOLN
INTRAMUSCULAR | Status: DC | PRN
  Administered 2022-12-30: 1 mg via INTRAVENOUS

## 2022-12-30 MED ORDER — SUGAMMADEX SODIUM 200 MG/2ML IV SOLN
INTRAVENOUS | Status: DC | PRN
  Administered 2022-12-30: 400 mg via INTRAVENOUS

## 2022-12-30 MED ORDER — LIDOCAINE 2% (20 MG/ML) 5 ML SYRINGE
INTRAMUSCULAR | Status: DC | PRN
  Administered 2022-12-30: 40 mg via INTRAVENOUS

## 2022-12-30 MED ORDER — BUPIVACAINE LIPOSOME 1.3 % IJ SUSP
INTRAMUSCULAR | Status: DC | PRN
  Administered 2022-12-30: 10 mL via PERINEURAL

## 2022-12-30 MED ORDER — OXYCODONE HCL 5 MG PO TABS: 5.0000 mg | ORAL_TABLET | ORAL | 0 refills | Status: DC | PRN

## 2022-12-30 MED ORDER — PROPOFOL 500 MG/50ML IV EMUL
INTRAVENOUS | Status: AC
  Filled 2022-12-30: qty 50

## 2022-12-30 MED ORDER — 0.9 % SODIUM CHLORIDE (POUR BTL) OPTIME
TOPICAL | Status: DC | PRN
  Administered 2022-12-30: 1000 mL

## 2022-12-30 MED ORDER — DEXAMETHASONE SODIUM PHOSPHATE 10 MG/ML IJ SOLN
INTRAMUSCULAR | Status: DC | PRN
  Administered 2022-12-30: 10 mg via INTRAVENOUS

## 2022-12-30 MED ORDER — LIDOCAINE HCL (PF) 2 % IJ SOLN
INTRAMUSCULAR | Status: AC
  Filled 2022-12-30: qty 5

## 2022-12-30 MED ORDER — OXYCODONE HCL 5 MG PO TABS: 5.0000 mg | ORAL_TABLET | Freq: Once | ORAL | Status: DC | PRN

## 2022-12-30 MED ORDER — DEXAMETHASONE SODIUM PHOSPHATE 10 MG/ML IJ SOLN
INTRAMUSCULAR | Status: AC
  Filled 2022-12-30: qty 1

## 2022-12-30 MED ORDER — SODIUM CHLORIDE 0.9 % IR SOLN
Status: DC | PRN
  Administered 2022-12-30: 1000 mL

## 2022-12-30 MED ORDER — MIDAZOLAM HCL 2 MG/2ML IJ SOLN
INTRAMUSCULAR | Status: AC
  Filled 2022-12-30: qty 2

## 2022-12-30 MED ORDER — PHENYLEPHRINE HCL-NACL 20-0.9 MG/250ML-% IV SOLN
INTRAVENOUS | Status: DC | PRN
  Administered 2022-12-30: 25 ug/min via INTRAVENOUS

## 2022-12-30 MED ORDER — OXYCODONE HCL 5 MG/5ML PO SOLN: 5.0000 mg | Freq: Once | ORAL | Status: DC | PRN

## 2022-12-30 SURGICAL SUPPLY — 78 items
AID PSTN UNV HD RSTRNT DISP (MISCELLANEOUS) ×1
BAG COUNTER SPONGE SURGICOUNT (BAG) IMPLANT
BAG SPEC THK2 15X12 ZIP CLS (MISCELLANEOUS) ×1
BAG SPNG CNTER NS LX DISP (BAG)
BAG ZIPLOCK 12X15 (MISCELLANEOUS) ×1 IMPLANT
BASEPLATE P2 COATD GLND 6.5X30 (Shoulder) IMPLANT
BIT DRILL 1.6MX128 (BIT) IMPLANT
BIT DRILL 2.5 DIA 127 CALI (BIT) IMPLANT
BIT DRILL 4 DIA CALIBRATED (BIT) IMPLANT
BLADE SAW SGTL 73X25 THK (BLADE) ×1 IMPLANT
BOOTIES KNEE HIGH SLOAN (MISCELLANEOUS) ×2 IMPLANT
BSPLAT GLND 30 STRL LF SHLDR (Shoulder) ×1 IMPLANT
COOLER ICEMAN CLASSIC (MISCELLANEOUS) IMPLANT
COVER BACK TABLE 60X90IN (DRAPES) ×1 IMPLANT
COVER SURGICAL LIGHT HANDLE (MISCELLANEOUS) ×1 IMPLANT
DRAPE INCISE IOBAN 66X45 STRL (DRAPES) ×1 IMPLANT
DRAPE ORTHO SPLIT 77X108 STRL (DRAPES) ×2
DRAPE POUCH INSTRU U-SHP 10X18 (DRAPES) ×1 IMPLANT
DRAPE SHEET LG 3/4 BI-LAMINATE (DRAPES) ×1 IMPLANT
DRAPE SURG 17X11 SM STRL (DRAPES) ×1 IMPLANT
DRAPE SURG ORHT 6 SPLT 77X108 (DRAPES) ×2 IMPLANT
DRAPE TOP 10253 STERILE (DRAPES) ×1 IMPLANT
DRAPE U-SHAPE 47X51 STRL (DRAPES) ×1 IMPLANT
DRSG AQUACEL AG ADV 3.5X 6 (GAUZE/BANDAGES/DRESSINGS) ×1 IMPLANT
DURAPREP 26ML APPLICATOR (WOUND CARE) ×2 IMPLANT
ELECT BLADE TIP CTD 4 INCH (ELECTRODE) ×1 IMPLANT
ELECT REM PT RETURN 15FT ADLT (MISCELLANEOUS) ×1 IMPLANT
FACESHIELD WRAPAROUND (MASK) ×1 IMPLANT
FACESHIELD WRAPAROUND OR TEAM (MASK) ×1 IMPLANT
GLOVE BIO SURGEON STRL SZ7.5 (GLOVE) ×1 IMPLANT
GLOVE BIOGEL PI IND STRL 6.5 (GLOVE) ×1 IMPLANT
GLOVE BIOGEL PI IND STRL 8 (GLOVE) ×1 IMPLANT
GLOVE SURG SS PI 6.5 STRL IVOR (GLOVE) ×1 IMPLANT
GOWN STRL REUS W/ TWL LRG LVL3 (GOWN DISPOSABLE) ×1 IMPLANT
GOWN STRL REUS W/ TWL XL LVL3 (GOWN DISPOSABLE) ×1 IMPLANT
GOWN STRL REUS W/TWL LRG LVL3 (GOWN DISPOSABLE) ×1
GOWN STRL REUS W/TWL XL LVL3 (GOWN DISPOSABLE) ×1
HANDPIECE INTERPULSE COAX TIP (DISPOSABLE) ×1
HEAD GLENOID W/SCREW 32MM (Shoulder) IMPLANT
HOOD PEEL AWAY T7 (MISCELLANEOUS) ×3 IMPLANT
INSERT EPOLY STND HUMERUS+4 36 (Shoulder) ×1 IMPLANT
INSERT EPOLYSTD HUMERUS+4 36 (Shoulder) IMPLANT
KIT BASIN OR (CUSTOM PROCEDURE TRAY) ×1 IMPLANT
KIT TURNOVER KIT A (KITS) IMPLANT
MANIFOLD NEPTUNE II (INSTRUMENTS) ×1 IMPLANT
NDL TROCAR POINT SZ 2 1/2 (NEEDLE) IMPLANT
NEEDLE TROCAR POINT SZ 2 1/2 (NEEDLE) IMPLANT
NS IRRIG 1000ML POUR BTL (IV SOLUTION) ×1 IMPLANT
P2 COATDE GLNOID BSEPLT 6.5X30 (Shoulder) ×1 IMPLANT
PACK SHOULDER (CUSTOM PROCEDURE TRAY) ×1 IMPLANT
PAD COLD SHLDR WRAP-ON (PAD) IMPLANT
PROTECTOR NERVE ULNAR (MISCELLANEOUS) IMPLANT
RESTRAINT HEAD UNIVERSAL NS (MISCELLANEOUS) ×1 IMPLANT
RETRIEVER SUT HEWSON (MISCELLANEOUS) IMPLANT
SCREW BONE LOCKING RSP 5.0X34 (Screw) ×2 IMPLANT
SCREW BONE RSP LOCK 5X22 (Screw) IMPLANT
SCREW BONE RSP LOCK 5X34 (Screw) IMPLANT
SCREW BONE RSP LOCKING 5.0X32 (Screw) ×1 IMPLANT
SET HNDPC FAN SPRY TIP SCT (DISPOSABLE) ×1 IMPLANT
SLING ARM IMMOBILIZER LRG (SOFTGOODS) IMPLANT
SLING ARM IMMOBILIZER MED (SOFTGOODS) IMPLANT
SLING ARM IMMOBILIZER XL (CAST SUPPLIES) IMPLANT
STEM HUMERAL STD SHELL 14X48 (Miscellaneous) IMPLANT
STRIP CLOSURE SKIN 1/2X4 (GAUZE/BANDAGES/DRESSINGS) ×2 IMPLANT
SUCTION FRAZIER HANDLE 10FR (MISCELLANEOUS)
SUCTION TUBE FRAZIER 10FR DISP (MISCELLANEOUS) IMPLANT
SUPPORT WRAP ARM LG (MISCELLANEOUS) ×1 IMPLANT
SUT ETHIBOND 2 V 37 (SUTURE) IMPLANT
SUT FIBERWIRE #2 38 REV NDL BL (SUTURE)
SUT MNCRL AB 4-0 PS2 18 (SUTURE) ×1 IMPLANT
SUT VIC AB 2-0 CT1 27 (SUTURE) ×2
SUT VIC AB 2-0 CT1 TAPERPNT 27 (SUTURE) ×2 IMPLANT
SUTURE FIBERWR#2 38 REV NDL BL (SUTURE) IMPLANT
TAPE LABRALWHITE 1.5X36 (TAPE) IMPLANT
TAPE SUT LABRALTAP WHT/BLK (SUTURE) IMPLANT
TOWEL OR 17X26 10 PK STRL BLUE (TOWEL DISPOSABLE) ×1 IMPLANT
TOWEL OR NON WOVEN STRL DISP B (DISPOSABLE) ×1 IMPLANT
WATER STERILE IRR 1000ML POUR (IV SOLUTION) ×1 IMPLANT

## 2022-12-30 NOTE — Transfer of Care (Signed)
Immediate Anesthesia Transfer of Care Note  Patient: Johnathan [name]  Procedure(s) Performed: REVERSE SHOULDER ARTHROPLASTY (Right: Shoulder)  Patient Location: PACU  Anesthesia Type:General  Level of Consciousness: awake, oriented, and patient cooperative  Airway & Oxygen Therapy: Patient Spontanous Breathing and Patient connected to face mask oxygen  Post-op Assessment: Report given to RN and Post -op Vital signs reviewed and stable  Post vital signs: Reviewed and stable  Last Vitals:  Vitals Value Taken Time  BP 128/72 12/30/22 0906  Temp    Pulse 57 12/30/22 0908  Resp 17 12/30/22 0908  SpO2 91 % 12/30/22 0908  Vitals shown include unvalidated device data.  Last Pain:  Vitals:   12/30/22 0546  TempSrc: Oral  PainSc: 0-No pain      Patients Stated Pain Goal: 5 ([id number])  Complications: No notable events documented.

## 2022-12-30 NOTE — Evaluation (Signed)
Occupational Therapy Evaluation Patient Details Name: Johnathan [name] MRN: [id number] DOB: July 27, 1956 Today's Date: 12/30/2022   History of Present Illness 67 yo M s/p TSA.  PMH includes: OSA, HTN, CHF, A-fib, R TKR.   Clinical Impression   Patient admitted for the procedure above.  PTA he lives with his spouse, who is able to assist as needed.  Currently he is needing Mod A for ADL from a sit to stand level, and Min A for basic mobility.  Patient with verbalized understanding of shoulder precautions and ROM restrictions to R shoulder.  Patient able to demo elbow distal HEP as prescribed with L upper extremity.  No further OT needs in the acute setting.  Recommend follow up as prescribed by MD.       Recommendations for follow up therapy are one component of a multi-disciplinary discharge planning process, led by the attending physician.  Recommendations may be updated based on patient status, additional functional criteria and insurance authorization.   Follow Up Recommendations  Follow physician's recommendations for discharge plan and follow up therapies     Assistance Recommended at Discharge Intermittent Supervision/Assistance  Patient can return home with the following Assist for transportation;A lot of help with bathing/dressing/bathroom;A little help with walking and/or transfers    Functional Status Assessment  Patient has had a recent decline in their functional status and demonstrates the ability to make significant improvements in function in a reasonable and predictable amount of time.  Equipment Recommendations  None recommended by OT    Recommendations for Other Services       Precautions / Restrictions Precautions Precautions: Shoulder Type of Shoulder Precautions: No A/PROM to R shoulder Shoulder Interventions: Shoulder sling/immobilizer Precaution Booklet Issued: Yes (comment) Precaution Comments: verbalized understanding Required Braces or Orthoses:  Sling Restrictions Weight Bearing Restrictions: Yes RUE Weight Bearing: Non weight bearing Other Position/Activity Restrictions: No pushing or pulling      Mobility Bed Mobility                    Transfers Overall transfer level: Needs assistance   Transfers: Sit to/from Stand Sit to Stand: Min assist                  Balance Overall balance assessment: Needs assistance Sitting-balance support: Feet supported Sitting balance-Leahy Scale: Good     Standing balance support: Reliant on assistive device for balance Standing balance-Leahy Scale: Poor                             ADL either performed or assessed with clinical judgement   ADL                   Upper Body Dressing : Moderate assistance;Standing   Lower Body Dressing: Moderate assistance;Sit to/from stand   Toilet Transfer: Min guard                   Vision Patient Visual Report: No change from baseline       Perception     Praxis      Pertinent Vitals/Pain Pain Assessment Pain Assessment: No/denies pain     Hand Dominance Right   Extremity/Trunk Assessment Upper Extremity Assessment Upper Extremity Assessment: RUE deficits/detail RUE Deficits / Details: TSA RUE Sensation: decreased light touch RUE Coordination: decreased fine motor;decreased gross motor   Lower Extremity Assessment Lower Extremity Assessment: Overall WFL for tasks assessed   Cervical / Trunk Assessment  Cervical / Trunk Assessment: Kyphotic   Communication Communication Communication: No difficulties   Cognition Arousal/Alertness: Awake/alert Behavior During Therapy: WFL for tasks assessed/performed Overall Cognitive Status: Within Functional Limits for tasks assessed                                       General Comments   VSS on RA    Exercises General Exercises - Upper Extremity Elbow Flexion: AAROM Elbow Extension: AAROM Wrist Flexion: AAROM Wrist  Extension: AAROM Digit Composite Flexion: AROM Composite Extension: AROM   Shoulder Instructions Shoulder Instructions Donning/doffing shirt without moving shoulder: Moderate assistance Method for sponge bathing under operated UE: Supervision/safety Donning/doffing sling/immobilizer: Moderate assistance Pendulum exercises (written home exercise program): Supervision/safety ROM for elbow, wrist and digits of operated UE: Supervision/safety Proper positioning of operated UE when showering: Supervision/safety    Home Living Family/patient expects to be discharged to:: Private residence Living Arrangements: Spouse/significant other Available Help at Discharge: Family;Available 24 hours/day Type of Home: House Home Access: Ramped entrance     Home Layout: One level     Bathroom Shower/Tub: Teacher, early years/pre: Handicapped height Bathroom Accessibility: Yes How Accessible: Accessible via walker Home Equipment: Rollator (4 wheels);Cane - single point;Shower seat          Prior Functioning/Environment Prior Level of Function : Independent/Modified Independent;Driving                        OT Problem List: Decreased strength;Impaired balance (sitting and/or standing);Impaired UE functional use      OT Treatment/Interventions:      OT Goals(Current goals can be found in the care plan section) Acute Rehab OT Goals Patient Stated Goal: Return home OT Goal Formulation: With patient Time For Goal Achievement: 01/03/23 Potential to Achieve Goals: Good  OT Frequency:      Co-evaluation              AM-PAC OT "6 Clicks" Daily Activity     Outcome Measure Help from another person eating meals?: None Help from another person taking care of personal grooming?: A Little Help from another person toileting, which includes using toliet, bedpan, or urinal?: A Little Help from another person bathing (including washing, rinsing, drying)?: A Lot Help from  another person to put on and taking off regular upper body clothing?: A Lot Help from another person to put on and taking off regular lower body clothing?: A Lot 6 Click Score: 16   End of Session Nurse Communication: Mobility status  Activity Tolerance: Patient tolerated treatment well Patient left: in chair;with call bell/phone within reach;with family/visitor present  OT Visit Diagnosis: Unsteadiness on feet (R26.81)                Time: [id number] OT Time Calculation (min): 21 min Charges:  OT General Charges $OT Visit: 1 Visit OT Evaluation $OT Eval Moderate Complexity: 1 Mod  12/30/2022  RP, OTR/L  Acute Rehabilitation Services  Office:  [phone]   Metta Clines 12/30/2022, 10:50 AM

## 2022-12-30 NOTE — Anesthesia Procedure Notes (Signed)
Procedure Name: Intubation Date/Time: 12/30/2022 7:29 AM  Performed by: Lavina Hamman, CRNAPre-anesthesia Checklist: Patient identified, Emergency Drugs available, Suction available and Patient being monitored Patient Re-evaluated:Patient Re-evaluated prior to induction Oxygen Delivery Method: Circle System Utilized Preoxygenation: Pre-oxygenation with 100% oxygen Induction Type: IV induction Ventilation: Two handed mask ventilation required and Oral airway inserted - appropriate to patient size Laryngoscope Size: [name] Heck and 2 Grade View: Grade I Tube type: Oral Tube size: 7.5 mm Number of attempts: 1 Airway Equipment and Method: Stylet and Oral airway Placement Confirmation: ETT inserted through vocal cords under direct vision, positive ETCO2 and breath sounds checked- equal and bilateral Secured at: 23 cm Tube secured with: Tape Dental Injury: Teeth and Oropharynx as per pre-operative assessment  Comments: Intubated by A. Erick Blinks

## 2022-12-30 NOTE — Anesthesia Postprocedure Evaluation (Signed)
Anesthesia Post Note  Patient: Johnathan [name]  Procedure(s) Performed: REVERSE SHOULDER ARTHROPLASTY (Right: Shoulder)     Patient location during evaluation: PACU Anesthesia Type: General Level of consciousness: awake and alert Pain management: pain level controlled Vital Signs Assessment: post-procedure vital signs reviewed and stable Respiratory status: spontaneous breathing, nonlabored ventilation and respiratory function stable Cardiovascular status: stable and blood pressure returned to baseline Anesthetic complications: no   No notable events documented.  Last Vitals:  Vitals:   12/30/22 0915 12/30/22 0930  BP: 129/77 121/71  Pulse: (!) 56 (!) 51  Resp: 15 17  Temp:  36.9 C  SpO2: 91% 92%    Last Pain:  Vitals:   12/30/22 0930  TempSrc:   PainSc: 0-No pain                 Audry Pili

## 2022-12-30 NOTE — H&P (Signed)
Johnathan [name] is an 67 y.o. male.   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  Pain interferes with sleep and quality of life.   Past Medical History:  Diagnosis Date   Arthritis    a. chronic joint pain   B12 deficiency    Chronic systolic CHF (congestive heart failure) ([place])    a. echo 2013: EF of 50-55%, normal right ventricular systolic pressure, normal left atrium; b. EF 40-45%, inadequate for LV wall motion, mild MR, LA severely dilated @ 52 mm, PASP nl   Complication of anesthesia    Dysrhythmia    Erythema migrans (Lyme disease) 04/08/2015   History of DVT (deep vein thrombosis) 2004   s/p bariatric surgery   History of stress test    a. there was no ST segment deviation noted during stress,    There is a small defect of moderate severity present in the apex location, suggestive of apical ischemia, intermediate risk, calculated EF 21% but visually appears to be 35-40%   Hypertension    Hypothyroidism    Neuropathy    Obesity    OSA (obstructive sleep apnea)    a. not compliant with CPAP   PAF (paroxysmal atrial fibrillation) ([place])    a. CHADSVASc at least 2 (HTN and vascular disease); b. on Eliquis    Pre-diabetes     Past Surgical History:  Procedure Laterality Date   ARTHROSCOPIC REPAIR ACL Bilateral 1993   bilateral knee x2 on left and x2 on right   BARIATRIC SURGERY  2004   [place]    [place] STUDY N/A 06/20/2015   Procedure: CARDIOVERSION;  Surgeon: Minna Merritts, MD;  Location: [facility] ORS;  Service: Cardiovascular;  Laterality: N/A;   KNEE ARTHROPLASTY Right 06/20/2017   Procedure: COMPUTER ASSISTED TOTAL KNEE ARTHROPLASTY;  Surgeon: Dereck Leep, MD;  Location: [facility] ORS;  Service: Orthopedics;  Laterality: Right;   MENISCUS DEBRIDEMENT Left 2011   Left knee    REPLACEMENT TOTAL KNEE Left 07/2013   TONSILLECTOMY      Family History  Problem Relation Age of Onset   Dementia Mother    Diabetes Father    Alzheimer's disease Father    Social History:  reports that he has never smoked. He has never been exposed to tobacco smoke. He has never used smokeless tobacco. He reports that he does not currently use alcohol after a past usage of about 2.0 standard drinks of alcohol per week. He reports current drug use.  Allergies:  Allergies  Allergen Reactions   Eliquis [Apixaban] Itching    "Red spots" and severe itching   Xarelto [Rivaroxaban] Hives    Reaction in 2013    Medications Prior to Admission  Medication Sig Dispense Refill   amiodarone (PACERONE) 200 MG tablet Take 1 tablet (200 mg total) by mouth 2 (two) times daily. 180 tablet 3   B Complex Vitamins (B COMPLEX 50 PO) Take 1 tablet by mouth daily.      buPROPion (WELLBUTRIN XL) 300 MG 24 hr tablet Take 1 tablet (300 mg total) by mouth daily. 90 tablet 3   Cholecalciferol (VITAMIN D3) 2000 UNITS TABS Take 2,000 Units by mouth daily.     docusate sodium (COLACE) 100 MG capsule Take 400 mg by mouth daily.      Ferrous Gluconate 324 (37.5  Fe) MG TABS Take 1 tablet by mouth daily.      gabapentin (NEURONTIN) 300 MG capsule TAKE 1 CAPSULE BY MOUTH THREE TIMES A DAY 270 capsule 3   GLUCOSAMINE HCL-MSM PO Take 1 tablet by mouth daily.      hydrochlorothiazide (HYDRODIURIL) 25 MG tablet Take 1 tablet by mouth once daily 90 tablet 3   HYDROcodone-acetaminophen (NORCO) 10-325 MG tablet Take 1 tablet by mouth every 8 (eight) hours as needed. 90 tablet 0   levothyroxine (SYNTHROID) 25 MCG tablet Take 1 tablet (25 mcg total) by mouth daily before breakfast. 90 tablet 3   losartan (COZAAR) 100 MG tablet Take 1 tablet (100 mg total) by mouth daily. 90 tablet 3   metoprolol succinate (TOPROL-XL) 50 MG 24 hr tablet TAKE 1 TABLET BY MOUTH DAILY. 90 tablet 1   Multiple Vitamin (MULTIVITAMIN) tablet Take 1  tablet by mouth daily.     rosuvastatin (CRESTOR) 10 MG tablet TAKE 1 TABLET (10 MG TOTAL) BY MOUTH DAILY AT 2 PM. 90 tablet 3   sildenafil (REVATIO) 20 MG tablet Take 1 tablet (20 mg total) by mouth 3 (three) times daily. 90 tablet 1   venlafaxine XR (EFFEXOR-XR) 150 MG 24 hr capsule Take 1 capsule (150 mg total) by mouth daily with breakfast. 90 capsule 1   vitamin B-12 (CYANOCOBALAMIN) 500 MCG tablet Take 500 mcg by mouth daily.     vitamin E 400 UNIT capsule Take 400 Units by mouth daily.     warfarin (COUMADIN) 5 MG tablet TAKE 1 TABLET TO 1.5 TABLETS BY MOUTH DAILY AS DIRECTED BY ANTI-COAG CLINIC (Patient taking differently: Take 5-7.5 mg by mouth See admin instructions. Take 7.5 mg by mouth on Monday, Wednesday and Friday and take 5 mg on Tuesday, Thursday, Saturday and Sunday.) 110 tablet 0   furosemide (LASIX) 40 MG tablet Take 1 tablet (40 mg total) by mouth daily as needed. 90 tablet 0   HYDROcodone-acetaminophen (NORCO) 10-325 MG tablet Take 1 tablet by mouth every 8 (eight) hours as needed. (Patient not taking: Reported on 12/21/2022) 90 tablet 0   HYDROcodone-acetaminophen (NORCO/VICODIN) 5-325 MG tablet Take 2 tablets by mouth every 6 (six) hours as needed. (Patient not taking: Reported on 12/21/2022) 180 tablet 0   Potassium Chloride ER 20 MEQ TBCR Take 20 mEq by mouth 2 (two) times daily as needed. 180 tablet 3   propranolol (INDERAL) 20 MG tablet Take 1 tablet (20 mg total) by mouth 3 (three) times daily as needed. Reported on 03/22/2016 270 tablet 3   tadalafil (CIALIS) 20 MG tablet Take 1 tablet (20 mg total) by mouth daily as needed for erectile dysfunction. 30 tablet 1    Results for orders placed or performed during the hospital encounter of 12/30/22 (from the past 48 hour(s))  PT-INR Day of Surgery per protocol     Status: Abnormal   Collection Time: 12/30/22  6:00 AM  Result Value Ref Range   Prothrombin Time 16.4 (H) 11.4 - 15.2 seconds   INR 1.3 (H) 0.8 - 1.2    Comment:  (NOTE) INR goal varies based on device and disease states. Performed at [facility], [place] [address], [place], [place] [address]    No results found.  Review of Systems  All other systems reviewed and are negative.   Blood pressure 138/84, pulse (!) 53, temperature 98.4 F (36.9 C), temperature source Oral, resp. rate 18, height 6\' 2"  (1.88 m), weight (!) 166 kg, SpO2 95 %.  Physical Exam Constitutional:      Appearance: He is well-developed.  HENT:     Head: Atraumatic.  Eyes:     Extraocular Movements: Extraocular movements intact.  Cardiovascular:     Pulses: Normal pulses.  Pulmonary:     Effort: Pulmonary effort is normal.  Musculoskeletal:     Comments: R shoulder pain with limited ROM. NVID  Skin:    General: Skin is warm and dry.  Neurological:     Mental Status: He is alert and oriented to person, place, and time.  Psychiatric:        Mood and Affect: Mood normal.      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

## 2022-12-30 NOTE — Discharge Instructions (Addendum)
Discharge Instructions after Reverse Total Shoulder Arthroplasty   A sling has been provided for you. You are to wear this at all times (except for bathing and dressing), until your first post operative visit with Dr. Tamera Punt. Please also wear while sleeping at night. While you bath and dress, let the arm/elbow extend straight down to stretch your elbow. Wiggle your fingers and pump your first while your in the sling to prevent hand swelling. Use ice on the shoulder intermittently over the first 48 hours after surgery. Continue to use ice or and ice machine as needed after 48 hours for pain control/swelling.  Pain medicine has been prescribed for you.  Use your medicine liberally over the first 48 hours, and then you can begin to taper your use. You may take Extra Strength Tylenol or Tylenol only in place of the pain pills. DO NOT take ANY nonsteroidal anti-inflammatory pain medications: Advil, Motrin, Ibuprofen, Aleve, Naproxen or Naprosyn.  Resume coumadin the day after surgery and work with your PCP/cardiologist to get PT/INR in appropriate range  Leave your dressing on until your first follow up visit.  You may shower with the dressing.  Hold your arm as if you still have your sling on while you shower. Simply allow the water to wash over the site and then pat dry. Make sure your axilla (armpit) is completely dry after showering. You may not drive until you are seen back in the office 2 weeks after surgery    Please call [phone] during normal business hours or [phone] after hours for any problems. Including the following:  - excessive redness of the incisions - drainage for more than 4 days - fever of more than 101.5 F  *Please note that pain medications will not be refilled after hours or on weekends. Dental Antibiotics:  In most cases prophylactic antibiotics for Dental procdeures after total joint surgery are not necessary.  Exceptions are as follows:  1. History of prior  total joint infection  2. Severely immunocompromised (Organ Transplant, cancer chemotherapy, Rheumatoid biologic meds such as [place])  3. Poorly controlled diabetes (A1C &gt; 8.0, blood glucose over 200)  If you have one of these conditions, contact your surgeon for an antibiotic prescription, prior to your dental procedure.

## 2022-12-30 NOTE — Op Note (Signed)
Procedure(s): REVERSE SHOULDER ARTHROPLASTY Procedure Note  CHALRES VIVES male 67 y.o. 12/30/2022  Preoperative diagnosis: Right shoulder rotator cuff tear arthropathy  Postoperative diagnosis: Same  Procedure(s) and Anesthesia Type:    * REVERSE SHOULDER ARTHROPLASTY - Choice   Indications:  67 y.o. male  With endstage right shoulder arthritis with irrepairable rotator cuff tear. Pain and dysfunction interfered with quality of life and nonoperative treatment with activity modification, NSAIDS and injections failed.     Surgeon: Rhae Hammock   Assistants: Sheryle Hail PA-C Amber was present and scrubbed throughout the procedure and was essential in positioning, retraction, exposure, and closure)  Anesthesia: General endotracheal anesthesia with preoperative interscalene block given by the attending anesthesiologist    Procedure Detail  REVERSE SHOULDER ARTHROPLASTY   Estimated Blood Loss:  200 mL         Drains: none  Blood Given: none          Specimens: none        Complications:  * No complications entered in OR log *         Disposition: PACU - hemodynamically stable.         Condition: stable      OPERATIVE FINDINGS:  A DJO Altivate pressfit reverse total shoulder arthroplasty was placed with a  size 14 stem, a 36 standard glenosphere, and a +4-mm poly insert. The base plate  fixation was excellent.  PROCEDURE: The patient was identified in the preoperative holding area  where I personally marked the operative site after verifying site, side,  and procedure with the patient. An interscalene block given by  the attending anesthesiologist in the holding area and the patient was taken back to the operating room where all extremities were  carefully padded in position after general anesthesia was induced. She  was placed in a beach-chair position and the operative upper extremity was  prepped and draped in a standard sterile fashion. An  approximately 10-  cm incision was made from the tip of the coracoid process to the center  point of the humerus at the level of the axilla. Dissection was carried  down through subcutaneous tissues to the level of the cephalic vein  which was taken laterally with the deltoid. The pectoralis major was  retracted medially. The subdeltoid space was developed and the lateral  edge of the conjoined tendon was identified. The undersurface of  conjoined tendon was palpated and the musculocutaneous nerve was not in  the field. Retractor was placed underneath the conjoined and second  retractor was placed lateral into the deltoid. The circumflex humeral  artery and vessels were identified and clamped and coagulated. The  biceps tendon was chronically torn.  The subscapularis was severely degenerated and taken down as a peel with the underlying capsule.  The  joint was then gently externally rotated while the capsule was released  from the humeral neck around to just beyond the 6 o'clock position. At  this point, the joint was dislocated and the humeral head was presented  into the wound. The excessive osteophyte formation was removed with a  large rongeur.  The cutting guide was used to make the appropriate  head cut and the head was saved for potentially bone grafting.  The glenoid was exposed with the arm in an  abducted extended position. The anterior and posterior labrum were  completely excised and the capsule was released circumferentially to  allow for exposure of the glenoid for preparation. The 2.5 mm drill  was  placed using the guide in 5-10 inferior angulation and the tap was then advanced in the same hole. Small and large reamers were then used. The tap was then removed and the Metaglene was then screwed in with excellent purchase.  The peripheral guide was then used to drilled measured and filled peripheral locking screws. The size 36 standard glenosphere was then impacted on the [facility]  taper and the central screw was placed. The humerus was then again exposed and the diaphyseal reamers were used followed by the metaphyseal reamers. The final broach was left in place in the proximal trial was placed. The joint was reduced and with this implant it was felt that soft tissue tensioning was appropriate with excellent stability and excellent range of motion. Therefore, final humeral stem was placed press-fit.  And then the trial polyethylene inserts were tested again and the above implant was felt to be the most appropriate for final insertion. The joint was reduced taken through full range of motion and felt to be stable. Soft tissue tension was appropriate.  The joint was then copiously irrigated with pulse  lavage and the wound was then closed. The subscapularis was not repaired.  Skin was closed with 2-0 Vicryl in a deep dermal layer and 4-0  Monocryl for skin closure. Steri-Strips were applied. Sterile  dressings were then applied as well as a sling. The patient was allowed  to awaken from general anesthesia, transferred to stretcher, and taken  to recovery room in stable condition.   POSTOPERATIVE PLAN: The patient will be observed in the recovery room and if his pain is well-controlled with regional anesthesia and he is hemodynamically stable he can be discharged home today with family.

## 2022-12-30 NOTE — Anesthesia Procedure Notes (Signed)
Anesthesia Regional Block: Interscalene brachial plexus block   Pre-Anesthetic Checklist: , timeout performed,  Correct Patient, Correct Site, Correct Laterality,  Correct Procedure, Correct Position, site marked,  Risks and benefits discussed,  Surgical consent,  Pre-op evaluation,  At surgeon's request and post-op pain management  Laterality: Right  Prep: chloraprep       Needles:  Injection technique: Single-shot  Needle Type: Echogenic Needle     Needle Length: 5cm  Needle Gauge: 21     Additional Needles:   Narrative:  Start time: 12/30/2022 6:59 AM End time: 12/30/2022 7:02 AM Injection made incrementally with aspirations every 5 mL.  Performed by: Personally  Anesthesiologist: Audry Pili, MD  Additional Notes: No pain on injection. No increased resistance to injection. Injection made in 5cc increments. Good needle visualization. Patient tolerated the procedure well.

## 2022-12-31 ENCOUNTER — Encounter (HOSPITAL_COMMUNITY): Payer: Self-pay | Admitting: Orthopedic Surgery

## 2023-01-05 ENCOUNTER — Ambulatory Visit: Payer: PPO | Attending: Cardiovascular Disease

## 2023-01-08 ENCOUNTER — Other Ambulatory Visit: Payer: Self-pay | Admitting: Internal Medicine

## 2023-01-10 ENCOUNTER — Encounter: Payer: Self-pay | Admitting: Internal Medicine

## 2023-01-11 MED ORDER — BUPROPION HCL ER (XL) 300 MG PO TB24: 300.0000 mg | ORAL_TABLET | Freq: Every day | ORAL | 3 refills | Status: DC

## 2023-01-11 MED ORDER — VENLAFAXINE HCL ER 150 MG PO CP24: 150.0000 mg | ORAL_CAPSULE | Freq: Every day | ORAL | 3 refills | Status: DC

## 2023-01-12 ENCOUNTER — Ambulatory Visit: Payer: PPO | Attending: Cardiovascular Disease

## 2023-01-12 DIAGNOSIS — Z5181 Encounter for therapeutic drug level monitoring: Secondary | ICD-10-CM

## 2023-01-12 DIAGNOSIS — I4819 Other persistent atrial fibrillation: Secondary | ICD-10-CM

## 2023-01-12 DIAGNOSIS — Z86718 Personal history of other venous thrombosis and embolism: Secondary | ICD-10-CM | POA: Diagnosis not present

## 2023-01-12 LAB — POCT INR: INR: 2.2 (ref 2.0–3.0)

## 2023-01-12 NOTE — Patient Instructions (Signed)
Description    Continue Warfarin 1 tablet every day EXCEPT 1.5 tablets on MONDAYS, WEDNESDAYS, FRIDAYS.  - Recheck INR in 6 weeks TRY TO GET YOUR GREENS ON A SCHEDULE.  PICK A DAY (OR DAYS) AND HAVE THEM ON THE SAME DAY EACH WEEK     

## 2023-01-19 ENCOUNTER — Encounter: Payer: Self-pay | Admitting: Internal Medicine

## 2023-01-24 ENCOUNTER — Other Ambulatory Visit: Payer: Self-pay | Admitting: Internal Medicine

## 2023-01-24 DIAGNOSIS — G894 Chronic pain syndrome: Secondary | ICD-10-CM | POA: Insufficient documentation

## 2023-01-24 MED ORDER — HYDROCODONE-ACETAMINOPHEN 10-325 MG PO TABS: 1.0000 | ORAL_TABLET | Freq: Three times a day (TID) | ORAL | 0 refills | Status: DC | PRN

## 2023-01-24 NOTE — Assessment & Plan Note (Signed)
Patient's pain is managed with hydrocodone every 8 hours  qty #90/month.  Patient submitted an early refill request following  shoulder surgery  despite  filling an  rx for oxycodone from the orthopedist.  Early refill was denied and patient was advised that failure to notify me of an alternative rx from another physician was a violation of contract.

## 2023-01-26 ENCOUNTER — Encounter: Payer: Self-pay | Admitting: Internal Medicine

## 2023-02-08 ENCOUNTER — Telehealth: Payer: Self-pay | Admitting: Internal Medicine

## 2023-02-08 NOTE — Telephone Encounter (Signed)
Called patient to schedule Medicare Annual Wellness Visit (AWV). Left message for patient to call back and schedule Medicare Annual Wellness Visit (AWV).  Last date of AWV: 02/22/2022  [employer] (Calendar Year)  Please schedule an AWVS appointment at any time with [facility] VISIT.  If any questions, please contact me at [phone].   Thank you,  [facility] Support [facility] Direct dial  [phone]

## 2023-02-10 ENCOUNTER — Ambulatory Visit: Payer: PPO | Admitting: Family Medicine

## 2023-02-15 NOTE — Progress Notes (Unsigned)
[name] [address] [place] [address] Phone: [phone] Subjective:   Johnathan [name], am serving as a scribe for Dr. Antoine Primas.  I'm seeing this patient by the request  of:  Sherlene Shams, MD  CC: Right wrist pain  [name]  12/14/2022 Having replacements in the very near future.     Patient is what appears to be more of a hematoma.  Discussed with patient about icing regimen and home exercises, discussed with patient about compression.  Patient declined any type of aspiration today.  Is fairly large overall and just happened yesterday.  Hopefully later will slowly improve over the course of the next week or 2.  Warned the patient if any numbness, weakness recurs to seek medical attention immediately.      Update 02/16/2023 Johnathan [name] is a 67 y.o. male coming in with complaint of R wrist and R shoulder pain. Reverse shoulder surgery 12/30/2022. Patient states that his shoulder is doing great. Starts PT on shoulder on Monday the 13th.   Pain in the ulnar side of wrist that radiates into the 4th and 5th fingers. Patient feels a pressure in the fingers. Also notes a burning sensation in the finger tips. Wrist stays swollen. Patient occasionally has pain that will radiate from shoulder down the arm.        Past Medical History:  Diagnosis Date   Arthritis    a. chronic joint pain   B12 deficiency    Chronic systolic CHF (congestive heart failure) (HCC)    a. echo 2013: EF of 50-55%, normal right ventricular systolic pressure, normal left atrium; b. EF 40-45%, inadequate for LV wall motion, mild MR, LA severely dilated @ 52 mm, PASP nl   Complication of anesthesia    Dysrhythmia    Erythema migrans (Lyme disease) 04/08/2015   History of DVT (deep vein thrombosis) 2004   s/p bariatric surgery   History of stress test    a. there was no ST segment deviation noted during stress,    There is a small defect of moderate  severity present in the apex location, suggestive of apical ischemia, intermediate risk, calculated EF 21% but visually appears to be 35-40%   Hypertension    Hypothyroidism    Neuropathy    Obesity    OSA (obstructive sleep apnea)    a. not compliant with CPAP   PAF (paroxysmal atrial fibrillation) (HCC)    a. CHADSVASc at least 2 (HTN and vascular disease); b. on Eliquis    Pre-diabetes    Past Surgical History:  Procedure Laterality Date   ARTHROSCOPIC REPAIR ACL Bilateral 1993   bilateral knee x2 on left and x2 on right   BARIATRIC SURGERY  2004   Rou en Y    BARIATRIC SURGERY     CHOLECYSTECTOMY  1982   ELECTROPHYSIOLOGIC STUDY N/A 06/20/2015   Procedure: CARDIOVERSION;  Surgeon: Antonieta Iba, MD;  Location: [facility];  Service: Cardiovascular;  Laterality: N/A;   KNEE ARTHROPLASTY Right 06/20/2017   Procedure: COMPUTER ASSISTED TOTAL KNEE ARTHROPLASTY;  Surgeon: Donato Heinz, MD;  Location: [facility];  Service: Orthopedics;  Laterality: Right;   MENISCUS DEBRIDEMENT Left 2011   Left knee   REPLACEMENT TOTAL KNEE Left 07/2013   REVERSE SHOULDER ARTHROPLASTY Right 12/30/2022   Procedure: REVERSE SHOULDER ARTHROPLASTY;  Surgeon: Jones Broom, MD;  Location: [facility];  Service: Orthopedics;  Laterality: Right;   TONSILLECTOMY     Social  History   Socioeconomic History   Marital status: Married    Spouse name: Not on file   Number of children: Not on file   Years of education: Not on file   Highest education level: Not on file  Occupational History   Not on file  Tobacco Use   Smoking status: Never    Passive exposure: Never   Smokeless tobacco: Never  Vaping Use   Vaping Use: Never used  Substance and Sexual Activity   Alcohol use: Not Currently    Alcohol/week: 2.0 standard drinks of alcohol    Types: 2 Shots of liquor per week    Comment: Vodka and lemonade occasionally 2-3/week    Drug use: Yes    Comment: gummies delta A   Sexual activity: Yes     Partners: Female    Comment: Wife  Other Topics Concern   Not on file  Social History Narrative   Currently between jobs   Lives with Wife   1 Son (33) and 1 daughter (53)    2 grandchildren by daughter    1 dog and 1 cat    Enjoys gardening, movies, dining out.    Social Determinants of Health   Financial Resource Strain: Low Risk  (02/22/2022)   Overall Financial Resource Strain (CARDIA)    Difficulty of Paying Living Expenses: Not hard at all  Food Insecurity: No Food Insecurity (02/22/2022)   Hunger Vital Sign    Worried About Running Out of Food in the Last Year: Never true    Ran Out of Food in the Last Year: Never true  Transportation Needs: No Transportation Needs (02/22/2022)   PRAPARE - Administrator, Civil Service (Medical): No    Lack of Transportation (Non-Medical): No  Physical Activity: Not on file  Stress: No Stress Concern Present (02/22/2022)   [employer] of Occupational Health - Occupational Stress Questionnaire    Feeling of Stress : Not at all  Social Connections: Unknown (02/22/2022)   Social Connection and Isolation Panel [NHANES]    Frequency of Communication with Friends and Family: Not on file    Frequency of Social Gatherings with Friends and Family: Not on file    Attends Religious Services: Not on file    Active Member of Clubs or Organizations: Not on file    Attends Banker Meetings: Not on file    Marital Status: Married   Allergies  Allergen Reactions   Eliquis [Apixaban] Itching    "Red spots" and severe itching   Xarelto [Rivaroxaban] Hives    Reaction in 2013   Family History  Problem Relation Age of Onset   Dementia Mother    Diabetes Father    Alzheimer's disease Father     Current Outpatient Medications (Endocrine & Metabolic):    levothyroxine (SYNTHROID) 25 MCG tablet, Take 1 tablet (25 mcg total) by mouth daily before breakfast.  Current Outpatient Medications (Cardiovascular):    amiodarone  (PACERONE) 200 MG tablet, Take 1 tablet (200 mg total) by mouth 2 (two) times daily.   furosemide (LASIX) 40 MG tablet, Take 1 tablet (40 mg total) by mouth daily as needed.   hydrochlorothiazide (HYDRODIURIL) 25 MG tablet, Take 1 tablet by mouth once daily   losartan (COZAAR) 100 MG tablet, Take 1 tablet (100 mg total) by mouth daily.   metoprolol succinate (TOPROL-XL) 50 MG 24 hr tablet, TAKE 1 TABLET BY MOUTH DAILY.   propranolol (INDERAL) 20 MG tablet, Take 1 tablet (  20 mg total) by mouth 3 (three) times daily as needed. Reported on 03/22/2016   rosuvastatin (CRESTOR) 10 MG tablet, TAKE 1 TABLET (10 MG TOTAL) BY MOUTH DAILY AT 2 PM.   sildenafil (REVATIO) 20 MG tablet, Take 1 tablet (20 mg total) by mouth 3 (three) times daily.   tadalafil (CIALIS) 20 MG tablet, Take 1 tablet (20 mg total) by mouth daily as needed for erectile dysfunction.   Current Outpatient Medications (Analgesics):    HYDROcodone-acetaminophen (NORCO) 10-325 MG tablet, Take 1 tablet by mouth every 8 (eight) hours as needed.  Current Outpatient Medications (Hematological):    Ferrous Gluconate 324 (37.5 Fe) MG TABS, Take 1 tablet by mouth daily.    vitamin B-12 (CYANOCOBALAMIN) 500 MCG tablet, Take 500 mcg by mouth daily.   warfarin (COUMADIN) 5 MG tablet, TAKE 1 TABLET TO 1.5 TABLETS BY MOUTH DAILY AS DIRECTED BY ANTI-COAG CLINIC (Patient taking differently: Take 5-7.5 mg by mouth See admin instructions. Take 7.5 mg by mouth on Monday, Wednesday and Friday and take 5 mg on Tuesday, Thursday, Saturday and Sunday.)  Current Outpatient Medications (Other):    B Complex Vitamins (B COMPLEX 50 PO), Take 1 tablet by mouth daily.    buPROPion (WELLBUTRIN XL) 300 MG 24 hr tablet, Take 1 tablet (300 mg total) by mouth daily.   Cholecalciferol (VITAMIN D3) 2000 UNITS TABS, Take 2,000 Units by mouth daily.   docusate sodium (COLACE) 100 MG capsule, Take 400 mg by mouth daily.    gabapentin (NEURONTIN) 300 MG capsule, TAKE 1  CAPSULE BY MOUTH THREE TIMES A DAY   GLUCOSAMINE HCL-MSM PO, Take 1 tablet by mouth daily.    Multiple Vitamin (MULTIVITAMIN) tablet, Take 1 tablet by mouth daily.   Potassium Chloride ER 20 MEQ TBCR, Take 20 mEq by mouth 2 (two) times daily as needed.   tiZANidine (ZANAFLEX) 4 MG tablet, Take 1 tablet (4 mg total) by mouth every 8 (eight) hours as needed for muscle spasms.   venlafaxine XR (EFFEXOR-XR) 150 MG 24 hr capsule, Take 1 capsule (150 mg total) by mouth daily with breakfast.   vitamin E 400 UNIT capsule, Take 400 Units by mouth daily.   Reviewed prior external information including notes and imaging from  primary care provider As well as notes that were available from care everywhere and other healthcare systems.  Past medical history, social, surgical and family history all reviewed in electronic medical record.  No pertanent information unless stated regarding to the chief complaint.   Review of Systems:  No headache, visual changes, nausea, vomiting, diarrhea, constipation, dizziness, abdominal pain, skin rash, fevers, chills, night sweats, weight loss, swollen lymph nodes, body aches, joint swelling, chest pain, shortness of breath, mood changes. POSITIVE muscle aches, body aches, joint swelling  Objective  Blood pressure 116/82, pulse (!) 54, height 6\' 2"  (1.88 m), weight (!) 361 lb (163.7 kg), SpO2 96 %.   General: No apparent distress alert and oriented x3 mood and affect normal, dressed appropriately.  HEENT: Pupils equal, extraocular movements intact  Respiratory: Patient's speak in full sentences and does not appear short of breath  Severely antalgic gait noted.  Significant arthritic changes of multiple joints.  Does have some swelling noted of the hands.  Patient does have tenderness to palpation mostly over the dorsal aspect of the ECU tendon.  Patient does have some discomfort with grip strength.  Some mild pain over the abductor pollicis longus as well but not as much  as the radial  aspect.  Procedure: Real-time Ultrasound Guided Injection of extensor carpi ulnaris tendon sheath Device: GE Logiq Q7 Ultrasound guided injection is preferred based studies that show increased duration, increased effect, greater accuracy, decreased procedural pain, increased response rate, and decreased cost with ultrasound guided versus blind injection.  Verbal informed consent obtained.  Time-out conducted.  Noted no overlying erythema, induration, or other signs of local infection.  Skin prepped in a sterile fashion.  Local anesthesia: Topical Ethyl chloride.  With sterile technique and under real time ultrasound guidance: With a 25-gauge half inch needle injected with 0.5 cc of 0.5% Marcaine and 0.5 cc of Kenalog 40 mg/mL Completed without difficulty  Pain immediately resolved suggesting accurate placement of the medication.  Advised to call if fevers/chills, erythema, induration, drainage, or persistent bleeding.  Impression: Technically successful ultrasound guided injection.    Impression and Recommendations:     The above documentation has been reviewed and is accurate and complete Judi Saa, DO

## 2023-02-16 ENCOUNTER — Other Ambulatory Visit: Payer: Self-pay

## 2023-02-16 ENCOUNTER — Ambulatory Visit (INDEPENDENT_AMBULATORY_CARE_PROVIDER_SITE_OTHER): Payer: PPO | Admitting: Family Medicine

## 2023-02-16 ENCOUNTER — Encounter: Payer: Self-pay | Admitting: Family Medicine

## 2023-02-16 VITALS — BP 116/82 | HR 54 | Ht 74.0 in | Wt 361.0 lb

## 2023-02-16 DIAGNOSIS — M25531 Pain in right wrist: Secondary | ICD-10-CM | POA: Diagnosis not present

## 2023-02-16 DIAGNOSIS — M778 Other enthesopathies, not elsewhere classified: Secondary | ICD-10-CM

## 2023-02-16 NOTE — Assessment & Plan Note (Signed)
Patient given injection today, discussed that I do think that this is a worsening symptom with patient still recovering from his shoulder replacement.  Using the aid of a walker.  Putting more stress on the wrist that I think is contributing.  Patient also did have some hypoechoic changes noted of the thumb area in the abductor pollicis longus that may need to be injection as well.  Patient will follow-up again in 6 weeks if necessary.

## 2023-02-16 NOTE — Patient Instructions (Signed)
Good to see you! Tried a couple of injections that should help May need to do the thumb side See you again in 6-8 weeks

## 2023-02-23 ENCOUNTER — Ambulatory Visit: Payer: PPO | Attending: Cardiovascular Disease

## 2023-02-23 DIAGNOSIS — Z86718 Personal history of other venous thrombosis and embolism: Secondary | ICD-10-CM | POA: Diagnosis not present

## 2023-02-23 DIAGNOSIS — Z5181 Encounter for therapeutic drug level monitoring: Secondary | ICD-10-CM | POA: Diagnosis not present

## 2023-02-23 DIAGNOSIS — I4819 Other persistent atrial fibrillation: Secondary | ICD-10-CM | POA: Diagnosis not present

## 2023-02-23 LAB — POCT INR: INR: 3.5 — AB (ref 2.0–3.0)

## 2023-02-23 NOTE — Patient Instructions (Signed)
HOLD TODAY ONLY THEN Continue Warfarin 1 tablet every day EXCEPT 1.5 tablets on MONDAYS, WEDNESDAYS, FRIDAYS.  - Recheck INR in 4 weeks TRY TO GET YOUR GREENS ON A SCHEDULE.  PICK A DAY (OR DAYS) AND HAVE THEM ON THE SAME DAY EACH WEEK

## 2023-02-24 ENCOUNTER — Encounter: Payer: Self-pay | Admitting: Internal Medicine

## 2023-02-26 MED ORDER — HYDROCODONE-ACETAMINOPHEN 10-325 MG PO TABS: 1.0000 | ORAL_TABLET | Freq: Three times a day (TID) | ORAL | 0 refills | Status: DC | PRN

## 2023-03-01 ENCOUNTER — Other Ambulatory Visit: Payer: Self-pay | Admitting: Internal Medicine

## 2023-03-18 ENCOUNTER — Ambulatory Visit: Payer: PPO | Admitting: Internal Medicine

## 2023-03-23 ENCOUNTER — Ambulatory Visit: Payer: PPO | Admitting: Cardiovascular Disease

## 2023-03-23 ENCOUNTER — Ambulatory Visit: Payer: PPO | Attending: Cardiovascular Disease

## 2023-03-23 DIAGNOSIS — I4819 Other persistent atrial fibrillation: Secondary | ICD-10-CM | POA: Diagnosis not present

## 2023-03-23 DIAGNOSIS — Z86718 Personal history of other venous thrombosis and embolism: Secondary | ICD-10-CM

## 2023-03-23 DIAGNOSIS — Z5181 Encounter for therapeutic drug level monitoring: Secondary | ICD-10-CM | POA: Diagnosis not present

## 2023-03-23 LAB — POCT INR: INR: 5 — AB (ref 2.0–3.0)

## 2023-03-23 NOTE — Patient Instructions (Signed)
Description   Skip 2 dosages of Warfarin, then start taking Warfarin 1 tablet daily except  1.5 tablets on MONDAYS AND FRIDAYS.  Recheck INR in 1 week TRY TO GET YOUR GREENS ON A SCHEDULE.  PICK A DAY (OR DAYS) AND HAVE THEM ON THE SAME DAY EACH WEEK

## 2023-03-23 NOTE — Progress Notes (Signed)
[name] [address] [place] [address] Phone: [phone] Subjective:   [name], am serving as a scribe for Dr. Antoine Primas.  I'm seeing this patient by the request  of:  Sherlene Shams, MD  CC: Right wrist pain  [id number]  02/16/2023 Patient given injection today, discussed that I do think that this is a worsening symptom with patient still recovering from his shoulder replacement.  Using the aid of a walker.  Putting more stress on the wrist that I think is contributing.  Patient also did have some hypoechoic changes noted of the thumb area in the abductor pollicis longus that may need to be injection as well.  Patient will follow-up again in 6 weeks if necessary.      Update 03/24/2023 Johnathan [name] is a 67 y.o. male coming in with complaint of R wrist pain. Patient states wrist pain is worse. The last injection only lasted about 2-3 days. Pain is constant now.      Past Medical History:  Diagnosis Date   Arthritis    a. chronic joint pain   B12 deficiency    Chronic systolic CHF (congestive heart failure) (HCC)    a. echo 2013: EF of 50-55%, normal right ventricular systolic pressure, normal left atrium; b. EF 40-45%, inadequate for LV wall motion, mild MR, LA severely dilated @ 52 mm, PASP nl   Complication of anesthesia    Dysrhythmia    Erythema migrans (Lyme disease) 04/08/2015   History of DVT (deep vein thrombosis) 2004   s/p bariatric surgery   History of stress test    a. there was no ST segment deviation noted during stress,    There is a small defect of moderate severity present in the apex location, suggestive of apical ischemia, intermediate risk, calculated EF 21% but visually appears to be 35-40%   Hypertension    Hypothyroidism    Neuropathy    Obesity    OSA (obstructive sleep apnea)    a. not compliant with CPAP   PAF (paroxysmal atrial fibrillation) (HCC)    a. CHADSVASc at least 2 (HTN and  vascular disease); b. on Eliquis    Pre-diabetes    Past Surgical History:  Procedure Laterality Date   ARTHROSCOPIC REPAIR ACL Bilateral 1993   bilateral knee x2 on left and x2 on right   BARIATRIC SURGERY  2004   Rou en Y    BARIATRIC SURGERY     CHOLECYSTECTOMY  1982   ELECTROPHYSIOLOGIC STUDY N/A 06/20/2015   Procedure: CARDIOVERSION;  Surgeon: Antonieta Iba, MD;  Location: [facility];  Service: Cardiovascular;  Laterality: N/A;   KNEE ARTHROPLASTY Right 06/20/2017   Procedure: COMPUTER ASSISTED TOTAL KNEE ARTHROPLASTY;  Surgeon: Donato Heinz, MD;  Location: [facility];  Service: Orthopedics;  Laterality: Right;   MENISCUS DEBRIDEMENT Left 2011   Left knee   REPLACEMENT TOTAL KNEE Left 07/2013   REVERSE SHOULDER ARTHROPLASTY Right 12/30/2022   Procedure: REVERSE SHOULDER ARTHROPLASTY;  Surgeon: Jones Broom, MD;  Location: [facility];  Service: Orthopedics;  Laterality: Right;   TONSILLECTOMY     Social History   Socioeconomic History   Marital status: Married    Spouse name: Not on file   Number of children: Not on file   Years of education: Not on file   Highest education level: Not on file  Occupational History   Not on file  Tobacco Use   Smoking status: Never  Passive exposure: Never   Smokeless tobacco: Never  Vaping Use   Vaping Use: Never used  Substance and Sexual Activity   Alcohol use: Not Currently    Alcohol/week: 2.0 standard drinks of alcohol    Types: 2 Shots of liquor per week    Comment: Vodka and lemonade occasionally 2-3/week    Drug use: Yes    Comment: gummies delta A   Sexual activity: Yes    Partners: Female    Comment: Wife  Other Topics Concern   Not on file  Social History Narrative   Currently between jobs   Lives with Wife   1 Son (33) and 1 daughter (27)    2 grandchildren by daughter    1 dog and 1 cat    Enjoys gardening, movies, dining out.    Social Determinants of Health   Financial Resource Strain: Low Risk   (02/22/2022)   Overall Financial Resource Strain (CARDIA)    Difficulty of Paying Living Expenses: Not hard at all  Food Insecurity: No Food Insecurity (02/22/2022)   Hunger Vital Sign    Worried About Running Out of Food in the Last Year: Never true    Ran Out of Food in the Last Year: Never true  Transportation Needs: No Transportation Needs (02/22/2022)   PRAPARE - Administrator, Civil Service (Medical): No    Lack of Transportation (Non-Medical): No  Physical Activity: Not on file  Stress: No Stress Concern Present (02/22/2022)   [employer] of Occupational Health - Occupational Stress Questionnaire    Feeling of Stress : Not at all  Social Connections: Unknown (02/22/2022)   Social Connection and Isolation Panel [NHANES]    Frequency of Communication with Friends and Family: Not on file    Frequency of Social Gatherings with Friends and Family: Not on file    Attends Religious Services: Not on file    Active Member of Clubs or Organizations: Not on file    Attends Banker Meetings: Not on file    Marital Status: Married   Allergies  Allergen Reactions   Eliquis [Apixaban] Itching    "Red spots" and severe itching   Xarelto [Rivaroxaban] Hives    Reaction in 2013   Family History  Problem Relation Age of Onset   Dementia Mother    Diabetes Father    Alzheimer's disease Father     Current Outpatient Medications (Endocrine & Metabolic):    levothyroxine (SYNTHROID) 25 MCG tablet, TAKE 1 TABLET BY MOUTH ONCE DAILY BEFORE  BREAKFAST  Current Outpatient Medications (Cardiovascular):    amiodarone (PACERONE) 200 MG tablet, Take 1 tablet (200 mg total) by mouth 2 (two) times daily.   furosemide (LASIX) 40 MG tablet, Take 1 tablet (40 mg total) by mouth daily as needed.   hydrochlorothiazide (HYDRODIURIL) 25 MG tablet, Take 1 tablet by mouth once daily   losartan (COZAAR) 100 MG tablet, Take 1 tablet (100 mg total) by mouth daily.   metoprolol  succinate (TOPROL-XL) 50 MG 24 hr tablet, TAKE 1 TABLET BY MOUTH DAILY.   propranolol (INDERAL) 20 MG tablet, Take 1 tablet (20 mg total) by mouth 3 (three) times daily as needed. Reported on 03/22/2016   rosuvastatin (CRESTOR) 10 MG tablet, TAKE 1 TABLET (10 MG TOTAL) BY MOUTH DAILY AT 2 PM.   sildenafil (REVATIO) 20 MG tablet, Take 1 tablet (20 mg total) by mouth 3 (three) times daily.   tadalafil (CIALIS) 20 MG tablet, Take 1 tablet (  20 mg total) by mouth daily as needed for erectile dysfunction.   Current Outpatient Medications (Analgesics):    HYDROcodone-acetaminophen (NORCO) 10-325 MG tablet, Take 1 tablet by mouth every 8 (eight) hours as needed.  Current Outpatient Medications (Hematological):    Ferrous Gluconate 324 (37.5 Fe) MG TABS, Take 1 tablet by mouth daily.    vitamin B-12 (CYANOCOBALAMIN) 500 MCG tablet, Take 500 mcg by mouth daily.   warfarin (COUMADIN) 5 MG tablet, TAKE 1 TABLET TO 1.5 TABLETS BY MOUTH DAILY AS DIRECTED BY ANTI-COAG CLINIC (Patient taking differently: Take 5-7.5 mg by mouth See admin instructions. Take 7.5 mg by mouth on Monday, Wednesday and Friday and take 5 mg on Tuesday, Thursday, Saturday and Sunday.)  Current Outpatient Medications (Other):    B Complex Vitamins (B COMPLEX 50 PO), Take 1 tablet by mouth daily.    buPROPion (WELLBUTRIN XL) 300 MG 24 hr tablet, Take 1 tablet (300 mg total) by mouth daily.   Cholecalciferol (VITAMIN D3) 2000 UNITS TABS, Take 2,000 Units by mouth daily.   docusate sodium (COLACE) 100 MG capsule, Take 400 mg by mouth daily.    gabapentin (NEURONTIN) 300 MG capsule, TAKE 1 CAPSULE BY MOUTH THREE TIMES A DAY   GLUCOSAMINE HCL-MSM PO, Take 1 tablet by mouth daily.    Multiple Vitamin (MULTIVITAMIN) tablet, Take 1 tablet by mouth daily.   Potassium Chloride ER 20 MEQ TBCR, Take 20 mEq by mouth 2 (two) times daily as needed.   tiZANidine (ZANAFLEX) 4 MG tablet, Take 1 tablet (4 mg total) by mouth every 8 (eight) hours as  needed for muscle spasms.   venlafaxine XR (EFFEXOR-XR) 150 MG 24 hr capsule, Take 1 capsule (150 mg total) by mouth daily with breakfast.   vitamin E 400 UNIT capsule, Take 400 Units by mouth daily.     Objective  Blood pressure (!) 140/92, pulse (!) 109, height 6\' 2"  (1.88 m), weight (!) 361 lb (163.7 kg), SpO2 96 %.   General: No apparent distress alert and oriented x3 mood and affect normal, dressed appropriately.  HEENT: Pupils equal, extraocular movements intact  Respiratory: Patient's speak in full sentences and does not appear short of breath  Antalgic gait noted. Right wrist does have swelling noted.  Limited range of motion in all planes.  Osteoarthritic changes of multiple joints with crepitus noted.  Tender to palpation throughout and mostly over the scaphoid lunate joint.   Limited muscular skeletal ultrasound was performed and interpreted by Antoine Primas, M  Limited ultrasound shows arthritic changes noted.  Scaphoid lunate significant hypoechoic changes in alignment. Cyst noted.  Patient also has a ganglion cyst appears over the [facility] joint.  Significant osteoarthritic changes of multiple joints especially of the radiocarpal and ulnar carpal joints. Impression: Severe arthritic changes cyst noted   Impression and Recommendations:    The above documentation has been reviewed and is accurate and complete Judi Saa, DO

## 2023-03-24 ENCOUNTER — Other Ambulatory Visit: Payer: Self-pay

## 2023-03-24 ENCOUNTER — Encounter: Payer: Self-pay | Admitting: Family Medicine

## 2023-03-24 ENCOUNTER — Ambulatory Visit: Payer: PPO | Admitting: Family Medicine

## 2023-03-24 VITALS — BP 140/92 | HR 109 | Ht 74.0 in | Wt 361.0 lb

## 2023-03-24 DIAGNOSIS — M25531 Pain in right wrist: Secondary | ICD-10-CM | POA: Diagnosis not present

## 2023-03-24 NOTE — Patient Instructions (Addendum)
[facility] [phone] Call Today  When we receive your results we will contact you.  Referral to Neurology

## 2023-03-26 ENCOUNTER — Other Ambulatory Visit: Payer: Self-pay | Admitting: Internal Medicine

## 2023-03-28 ENCOUNTER — Ambulatory Visit (INDEPENDENT_AMBULATORY_CARE_PROVIDER_SITE_OTHER): Payer: PPO | Admitting: Internal Medicine

## 2023-03-28 ENCOUNTER — Encounter: Payer: Self-pay | Admitting: Internal Medicine

## 2023-03-28 VITALS — BP 138/82 | HR 67 | Ht 74.0 in | Wt 368.0 lb

## 2023-03-28 DIAGNOSIS — M25561 Pain in right knee: Secondary | ICD-10-CM

## 2023-03-28 DIAGNOSIS — E039 Hypothyroidism, unspecified: Secondary | ICD-10-CM | POA: Diagnosis not present

## 2023-03-28 DIAGNOSIS — G8929 Other chronic pain: Secondary | ICD-10-CM

## 2023-03-28 DIAGNOSIS — D6869 Other thrombophilia: Secondary | ICD-10-CM | POA: Diagnosis not present

## 2023-03-28 DIAGNOSIS — Z125 Encounter for screening for malignant neoplasm of prostate: Secondary | ICD-10-CM

## 2023-03-28 DIAGNOSIS — E7849 Other hyperlipidemia: Secondary | ICD-10-CM

## 2023-03-28 DIAGNOSIS — G894 Chronic pain syndrome: Secondary | ICD-10-CM

## 2023-03-28 DIAGNOSIS — I10 Essential (primary) hypertension: Secondary | ICD-10-CM

## 2023-03-28 DIAGNOSIS — I420 Dilated cardiomyopathy: Secondary | ICD-10-CM

## 2023-03-28 MED ORDER — ROSUVASTATIN CALCIUM 10 MG PO TABS: 10.0000 mg | ORAL_TABLET | Freq: Every day | ORAL | 3 refills | Status: DC

## 2023-03-28 MED ORDER — HYDROCODONE-ACETAMINOPHEN 10-325 MG PO TABS: 1.0000 | ORAL_TABLET | Freq: Three times a day (TID) | ORAL | 0 refills | Status: DC | PRN

## 2023-03-28 MED ORDER — METOPROLOL SUCCINATE ER 50 MG PO TB24: ORAL_TABLET | ORAL | 1 refills | Status: DC

## 2023-03-28 NOTE — Progress Notes (Unsigned)
Subjective:  Patient ID: Johnathan [name], male    DOB: September 07, 1956  Age: 67 y.o. MRN: [id number]  CC: There were no encounter diagnoses.   HPI Johnathan [name] presents for  Chief Complaint  Patient presents with   Medical Management of Chronic Issues    Lab Results  Component Value Date   PSA1 0.7 03/07/2020   PSA1 2.0 12/27/2017   PSA 0.68 10/16/2021    1) exertional dyspnea:  progressively worse since last visit .  Not walking as much because of decreased mobility,  frequent falls, persistent right knee pain since November.   House has a bug infestation problem.   Increased Stress  since January .  Wife had MALS release at [facility] on April  16  .  Patient's right shoulder replacement  was March 21 .  Left knee has been hurting since the fall in November.  Thinks he cracked his titanium knee  and wants to se Dr Ernest Pine or his PA .  Also having right hand numbness and increased pain attributed to arthritis , ultrasound showed  bone cysts .  MRI ordered by Katrinka Blazing and [facility] studies   Outpatient Medications Prior to Visit  Medication Sig Dispense Refill   amiodarone (PACERONE) 200 MG tablet Take 1 tablet (200 mg total) by mouth 2 (two) times daily. 180 tablet 3   B Complex Vitamins (B COMPLEX 50 PO) Take 1 tablet by mouth daily.      buPROPion (WELLBUTRIN XL) 300 MG 24 hr tablet Take 1 tablet (300 mg total) by mouth daily. 90 tablet 3   Cholecalciferol (VITAMIN D3) 2000 UNITS TABS Take 2,000 Units by mouth daily.     docusate sodium (COLACE) 100 MG capsule Take 400 mg by mouth daily.      Ferrous Gluconate 324 (37.5 Fe) MG TABS Take 1 tablet by mouth daily.      furosemide (LASIX) 40 MG tablet Take 1 tablet (40 mg total) by mouth daily as needed. 90 tablet 0   gabapentin (NEURONTIN) 300 MG capsule TAKE 1 CAPSULE BY MOUTH THREE TIMES A DAY 270 capsule 3   GLUCOSAMINE HCL-MSM PO Take 1 tablet by mouth daily.      hydrochlorothiazide (HYDRODIURIL) 25 MG tablet Take 1 tablet by mouth once  daily 90 tablet 3   HYDROcodone-acetaminophen (NORCO) 10-325 MG tablet Take 1 tablet by mouth every 8 (eight) hours as needed. 90 tablet 0   levothyroxine (SYNTHROID) 25 MCG tablet TAKE 1 TABLET BY MOUTH ONCE DAILY BEFORE  BREAKFAST 90 tablet 1   losartan (COZAAR) 100 MG tablet Take 1 tablet (100 mg total) by mouth daily. 90 tablet 3   metoprolol succinate (TOPROL-XL) 50 MG 24 hr tablet TAKE 1 TABLET BY MOUTH DAILY. 90 tablet 1   Multiple Vitamin (MULTIVITAMIN) tablet Take 1 tablet by mouth daily.     Potassium Chloride ER 20 MEQ TBCR Take 20 mEq by mouth 2 (two) times daily as needed. 180 tablet 3   propranolol (INDERAL) 20 MG tablet Take 1 tablet (20 mg total) by mouth 3 (three) times daily as needed. Reported on 03/22/2016 270 tablet 3   rosuvastatin (CRESTOR) 10 MG tablet TAKE 1 TABLET (10 MG TOTAL) BY MOUTH DAILY AT 2 PM. 90 tablet 3   sildenafil (REVATIO) 20 MG tablet Take 1 tablet (20 mg total) by mouth 3 (three) times daily. 90 tablet 1   tadalafil (CIALIS) 20 MG tablet Take 1 tablet (20 mg total) by mouth daily as  needed for erectile dysfunction. 30 tablet 1   tiZANidine (ZANAFLEX) 4 MG tablet Take 1 tablet (4 mg total) by mouth every 8 (eight) hours as needed for muscle spasms. 30 tablet 0   venlafaxine XR (EFFEXOR-XR) 150 MG 24 hr capsule Take 1 capsule (150 mg total) by mouth daily with breakfast. 90 capsule 3   vitamin B-12 (CYANOCOBALAMIN) 500 MCG tablet Take 500 mcg by mouth daily.     vitamin E 400 UNIT capsule Take 400 Units by mouth daily.     warfarin (COUMADIN) 5 MG tablet TAKE 1 TABLET TO 1.5 TABLETS BY MOUTH DAILY AS DIRECTED BY ANTI-COAG CLINIC (Patient taking differently: Take 5-7.5 mg by mouth See admin instructions. Take 7.5 mg by mouth on Monday, Wednesday and Friday and take 5 mg on Tuesday, Thursday, Saturday and Sunday.) 110 tablet 0   No facility-administered medications prior to visit.    Review of Systems;  Patient denies headache, fevers, malaise, unintentional  weight loss, skin rash, eye pain, sinus congestion and sinus pain, sore throat, dysphagia,  hemoptysis , cough, dyspnea, wheezing, chest pain, palpitations, orthopnea, edema, abdominal pain, nausea, melena, diarrhea, constipation, flank pain, dysuria, hematuria, urinary  Frequency, nocturia, numbness, tingling, seizures,  Focal weakness, Loss of consciousness,  Tremor, insomnia, depression, anxiety, and suicidal ideation.      Objective:  BP 138/82   Pulse 67   Ht 6\' 2"  (1.88 m)   Wt (!) 368 lb (166.9 kg)   SpO2 96%   BMI 47.25 kg/m   BP Readings from Last 3 Encounters:  03/28/23 138/82  03/24/23 (!) 140/92  02/16/23 116/82    Wt Readings from Last 3 Encounters:  03/28/23 (!) 368 lb (166.9 kg)  03/24/23 (!) 361 lb (163.7 kg)  02/16/23 (!) 361 lb (163.7 kg)    Physical Exam  Lab Results  Component Value Date   HGBA1C 5.6 12/22/2022   HGBA1C 5.8 09/15/2022   HGBA1C 5.4 03/19/2022    Lab Results  Component Value Date   CREATININE 0.77 12/22/2022   CREATININE 1.13 09/15/2022   CREATININE 0.95 03/19/2022    Lab Results  Component Value Date   WBC 6.4 12/22/2022   HGB 13.9 12/22/2022   HCT 43.1 12/22/2022   PLT 148 (L) 12/22/2022   GLUCOSE 106 (H) 12/22/2022   CHOL 111 09/15/2022   TRIG 169.0 (H) 09/15/2022   HDL 41.70 09/15/2022   LDLDIRECT 54.0 09/15/2022   LDLCALC 36 09/15/2022   ALT 24 09/15/2022   AST 19 09/15/2022   NA 136 12/22/2022   K 4.0 12/22/2022   CL 104 12/22/2022   CREATININE 0.77 12/22/2022   BUN 20 12/22/2022   CO2 25 12/22/2022   TSH 6.04 (H) 09/15/2022   PSA 0.68 10/16/2021   INR 5.0 (A) 03/23/2023   HGBA1C 5.6 12/22/2022   MICROALBUR 3.0 (H) 09/15/2022    No results found.  Assessment & Plan:  .There are no diagnoses linked to this encounter.   I provided 30 minutes of face-to-face time during this encounter reviewing patient's last visit with me, patient's  most recent visit with cardiology,  nephrology,  and neurology,  recent  surgical and non surgical procedures, previous  labs and imaging studies, counseling on currently addressed issues,  and post visit ordering to diagnostics and therapeutics .   Follow-up: No follow-ups on file.   Sherlene Shams, MD

## 2023-03-28 NOTE — Patient Instructions (Signed)
Your heart and lung exam is normal,  Your shortness of breath is likely due to deconditioning .  I will make the referral to dr hooten to have your right knee examined

## 2023-03-29 LAB — CBC WITH DIFFERENTIAL/PLATELET
Basophils Absolute: 0 10*3/uL (ref 0.0–0.1)
Basophils Relative: 0.6 % (ref 0.0–3.0)
Eosinophils Absolute: 0.1 10*3/uL (ref 0.0–0.7)
Eosinophils Relative: 1.7 % (ref 0.0–5.0)
HCT: 42 % (ref 39.0–52.0)
Hemoglobin: 13.7 g/dL (ref 13.0–17.0)
Lymphocytes Relative: 30.3 % (ref 12.0–46.0)
Lymphs Abs: 2.2 10*3/uL (ref 0.7–4.0)
MCHC: 32.7 g/dL (ref 30.0–36.0)
MCV: 91 fl (ref 78.0–100.0)
Monocytes Absolute: 0.8 10*3/uL (ref 0.1–1.0)
Monocytes Relative: 10.5 % (ref 3.0–12.0)
Neutro Abs: 4.2 10*3/uL (ref 1.4–7.7)
Neutrophils Relative %: 56.9 % (ref 43.0–77.0)
Platelets: 165 10*3/uL (ref 150.0–400.0)
RBC: 4.62 Mil/uL (ref 4.22–5.81)
RDW: 15.8 % — ABNORMAL HIGH (ref 11.5–15.5)
WBC: 7.3 10*3/uL (ref 4.0–10.5)

## 2023-03-29 LAB — COMPREHENSIVE METABOLIC PANEL
ALT: 20 U/L (ref 0–53)
AST: 18 U/L (ref 0–37)
Albumin: 4 g/dL (ref 3.5–5.2)
Alkaline Phosphatase: 99 U/L (ref 39–117)
BUN: 20 mg/dL (ref 6–23)
CO2: 25 mEq/L (ref 19–32)
Calcium: 8.5 mg/dL (ref 8.4–10.5)
Chloride: 107 mEq/L (ref 96–112)
Creatinine, Ser: 1.11 mg/dL (ref 0.40–1.50)
GFR: 68.64 mL/min (ref >60.00)
Glucose, Bld: 85 mg/dL (ref 70–99)
Potassium: 4 mEq/L (ref 3.5–5.1)
Sodium: 141 mEq/L (ref 135–145)
Total Bilirubin: 0.5 mg/dL (ref 0.2–1.2)
Total Protein: 7 g/dL (ref 6.0–8.3)

## 2023-03-29 LAB — LIPID PANEL
Cholesterol: 121 mg/dL (ref 0–200)
HDL: 43.3 mg/dL (ref >39.00)
LDL Cholesterol: 57 mg/dL (ref 0–99)
NonHDL: 78.17
Total CHOL/HDL Ratio: 3
Triglycerides: 108 mg/dL (ref 0.0–149.0)
VLDL: 21.6 mg/dL (ref 0.0–40.0)

## 2023-03-29 LAB — PSA, MEDICARE: PSA: 0.6 ng/ml (ref 0.10–4.00)

## 2023-03-29 LAB — TSH: TSH: 3.72 u[IU]/mL (ref 0.35–5.50)

## 2023-03-29 NOTE — Assessment & Plan Note (Signed)
Secondary to Diastolic dysfunction with LVH and left atrial dilation , mildly reduced systolic function  (40 to 45%) by 2016 ECHO   Complicated by untreated OSA

## 2023-03-29 NOTE — Assessment & Plan Note (Signed)
Thyroid function  was underactive in Dec and he was started on levothyroxine 25 mcg daily.. Repeat level pending    Lab Results  Component Value Date   TSH 6.04 (H) 09/15/2022

## 2023-03-29 NOTE — Assessment & Plan Note (Signed)
Managed with warfarin  for embolic stroke risk mitigation due to  atrial fibrillation. Patient has no signs of bleeding  in spite of multiple falls and is advised to notify his specialists prior to any procedure that may required suspension of warfarin . He has  regular follow up at the INR clinic and recent INR is elevated  he has been advised to contact his cardiologist to discuss a dose adjustment   Lab Results  Component Value Date   WBC 6.4 12/22/2022   HGB 13.9 12/22/2022   HCT 43.1 12/22/2022   MCV 94.7 12/22/2022   PLT 148 (L) 12/22/2022

## 2023-03-29 NOTE — Assessment & Plan Note (Signed)
Patient's  chronic pain is managed with hydrocodone every 8 hours  qty #90/month.  Patient submitted an early refill request following  shoulder surgery  despite  filling an  rx for oxycodone from the orthopedist.  Early refill was denied and patient was advised that failure to notify me of an alternative rx from another physician is considered  a violation of contract.  Resume hydrocodone regular schedule Refill history confirmed via [place] Controlled Substance databas, accessed by me today.Marland Kitchen

## 2023-03-30 ENCOUNTER — Ambulatory Visit: Payer: PPO | Attending: Cardiovascular Disease

## 2023-03-30 DIAGNOSIS — Z86718 Personal history of other venous thrombosis and embolism: Secondary | ICD-10-CM | POA: Diagnosis not present

## 2023-03-30 DIAGNOSIS — Z5181 Encounter for therapeutic drug level monitoring: Secondary | ICD-10-CM | POA: Diagnosis not present

## 2023-03-30 DIAGNOSIS — I4819 Other persistent atrial fibrillation: Secondary | ICD-10-CM | POA: Diagnosis not present

## 2023-03-30 LAB — POCT INR: INR: 2.7 (ref 2.0–3.0)

## 2023-03-30 NOTE — Patient Instructions (Signed)
Continue taking Warfarin 1 tablet daily except  1.5 tablets on MONDAYS AND FRIDAYS.  INR in 3 weeks TRY TO GET YOUR GREENS ON A SCHEDULE.  PICK A DAY (OR DAYS) AND HAVE THEM ON THE SAME DAY EACH WEEK

## 2023-04-01 ENCOUNTER — Ambulatory Visit (INDEPENDENT_AMBULATORY_CARE_PROVIDER_SITE_OTHER): Payer: PPO

## 2023-04-01 VITALS — Ht 74.0 in | Wt 368.0 lb

## 2023-04-01 DIAGNOSIS — Z0000 Encounter for general adult medical examination without abnormal findings: Secondary | ICD-10-CM | POA: Diagnosis not present

## 2023-04-01 NOTE — Progress Notes (Cosign Needed Addendum)
Subjective:   Johnathan [name] is a 67 y.o. male who presents for Medicare Annual/Subsequent preventive examination.  Visit Complete: Virtual  I connected with  [name] on 04/01/23 by a audio enabled telemedicine application and verified that I am speaking with the correct person using two identifiers.  Patient Location: Home  Provider Location: Home Office  I discussed the limitations of evaluation and management by telemedicine. The patient expressed understanding and agreed to proceed.  Patient Medicare AWV questionnaire was completed by the patient on 03/30/23; I have confirmed that all information answered by patient is correct and no changes since this date.  Review of Systems     Cardiac Risk Factors include: advanced age (>63men, >40 women);male gender;hypertension;sedentary lifestyle;obesity (BMI >30kg/m2);dyslipidemia     Objective:    Today's Vitals   04/01/23 1519  Weight: (!) 368 lb (166.9 kg)  Height: 6\' 2"  (1.88 m)   Body mass index is 47.25 kg/m.     04/01/2023    3:23 PM 12/22/2022   10:16 AM 02/22/2022   11:33 AM 06/20/2017    6:24 AM 06/09/2017   10:38 AM 06/20/2015    6:43 AM  Advanced Directives  Does Patient Have a Medical Advance Directive? No Yes Yes No No No  Type of Advance Directive  Living will;Healthcare Power of [employer] Power of [place];Living will     Does patient want to make changes to medical advance directive?   No - Patient declined     Copy of Healthcare Power of Attorney in Chart?   No - copy requested     Would patient like information on creating a medical advance directive? Yes (MAU/Ambulatory/Procedural Areas - Information given)   No - Patient declined No - Patient declined No - patient declined information    Current Medications (verified) Outpatient Encounter Medications as of 04/01/2023  Medication Sig   amiodarone (PACERONE) 200 MG tablet Take 1 tablet (200 mg total) by mouth 2 (two) times daily.   B  Complex Vitamins (B COMPLEX 50 PO) Take 1 tablet by mouth daily.    buPROPion (WELLBUTRIN XL) 300 MG 24 hr tablet Take 1 tablet (300 mg total) by mouth daily.   Cholecalciferol (VITAMIN D3) 2000 UNITS TABS Take 2,000 Units by mouth daily.   docusate sodium (COLACE) 100 MG capsule Take 400 mg by mouth daily.    Ferrous Gluconate 324 (37.5 Fe) MG TABS Take 1 tablet by mouth daily.    furosemide (LASIX) 40 MG tablet Take 1 tablet (40 mg total) by mouth daily as needed.   gabapentin (NEURONTIN) 300 MG capsule TAKE 1 CAPSULE BY MOUTH THREE TIMES A DAY   GLUCOSAMINE HCL-MSM PO Take 1 tablet by mouth daily.    hydrochlorothiazide (HYDRODIURIL) 25 MG tablet Take 1 tablet by mouth once daily   HYDROcodone-acetaminophen (NORCO) 10-325 MG tablet Take 1 tablet by mouth every 8 (eight) hours as needed.   levothyroxine (SYNTHROID) 25 MCG tablet TAKE 1 TABLET BY MOUTH ONCE DAILY BEFORE  BREAKFAST   losartan (COZAAR) 100 MG tablet Take 1 tablet (100 mg total) by mouth daily.   metoprolol succinate (TOPROL-XL) 50 MG 24 hr tablet TAKE 1 TABLET BY MOUTH DAILY.   Multiple Vitamin (MULTIVITAMIN) tablet Take 1 tablet by mouth daily.   Potassium Chloride ER 20 MEQ TBCR Take 20 mEq by mouth 2 (two) times daily as needed.   propranolol (INDERAL) 20 MG tablet Take 1 tablet (20 mg total) by mouth 3 (three) times  daily as needed. Reported on 03/22/2016   rosuvastatin (CRESTOR) 10 MG tablet Take 1 tablet (10 mg total) by mouth daily at 2 PM.   sildenafil (REVATIO) 20 MG tablet Take 1 tablet (20 mg total) by mouth 3 (three) times daily.   tadalafil (CIALIS) 20 MG tablet Take 1 tablet (20 mg total) by mouth daily as needed for erectile dysfunction.   tiZANidine (ZANAFLEX) 4 MG tablet Take 1 tablet (4 mg total) by mouth every 8 (eight) hours as needed for muscle spasms.   venlafaxine XR (EFFEXOR-XR) 150 MG 24 hr capsule Take 1 capsule (150 mg total) by mouth daily with breakfast.   vitamin B-12 (CYANOCOBALAMIN) 500 MCG tablet  Take 500 mcg by mouth daily.   vitamin E 400 UNIT capsule Take 400 Units by mouth daily.   warfarin (COUMADIN) 5 MG tablet TAKE 1 TABLET TO 1.5 TABLETS BY MOUTH DAILY AS DIRECTED BY ANTI-COAG CLINIC (Patient taking differently: Take 5-7.5 mg by mouth See admin instructions. Take 7.5 mg by mouth on Monday, Wednesday and Friday and take 5 mg on Tuesday, Thursday, Saturday and Sunday.)   No facility-administered encounter medications on file as of 04/01/2023.    Allergies (verified) Eliquis [apixaban] and Xarelto [rivaroxaban]   History: Past Medical History:  Diagnosis Date   Arthritis    a. chronic joint pain   B12 deficiency    Chronic systolic CHF (congestive heart failure) (HCC)    a. echo 2013: EF of 50-55%, normal right ventricular systolic pressure, normal left atrium; b. EF 40-45%, inadequate for LV wall motion, mild MR, LA severely dilated @ 52 mm, PASP nl   Complication of anesthesia    Dysrhythmia    Erythema migrans (Lyme disease) 04/08/2015   History of DVT (deep vein thrombosis) 2004   s/p bariatric surgery   History of stress test    a. there was no ST segment deviation noted during stress,    There is a small defect of moderate severity present in the apex location, suggestive of apical ischemia, intermediate risk, calculated EF 21% but visually appears to be 35-40%   Hypertension    Hypothyroidism    Neuropathy    Obesity    OSA (obstructive sleep apnea)    a. not compliant with CPAP   PAF (paroxysmal atrial fibrillation) (HCC)    a. CHADSVASc at least 2 (HTN and vascular disease); b. on Eliquis    Pre-diabetes    Past Surgical History:  Procedure Laterality Date   ARTHROSCOPIC REPAIR ACL Bilateral 1993   bilateral knee x2 on left and x2 on right   BARIATRIC SURGERY  2004   Rou en Y    BARIATRIC SURGERY     CHOLECYSTECTOMY  1982   ELECTROPHYSIOLOGIC STUDY N/A 06/20/2015   Procedure: CARDIOVERSION;  Surgeon: Antonieta Iba, MD;  Location: [facility] ORS;  Service:  Cardiovascular;  Laterality: N/A;   KNEE ARTHROPLASTY Right 06/20/2017   Procedure: COMPUTER ASSISTED TOTAL KNEE ARTHROPLASTY;  Surgeon: Donato Heinz, MD;  Location: [facility] ORS;  Service: Orthopedics;  Laterality: Right;   MENISCUS DEBRIDEMENT Left 2011   Left knee   REPLACEMENT TOTAL KNEE Left 07/2013   REVERSE SHOULDER ARTHROPLASTY Right 12/30/2022   Procedure: REVERSE SHOULDER ARTHROPLASTY;  Surgeon: Jones Broom, MD;  Location: [facility] ORS;  Service: Orthopedics;  Laterality: Right;   TONSILLECTOMY     Family History  Problem Relation Age of Onset   Dementia Mother    Diabetes Father    Alzheimer's disease Father  Social History   Socioeconomic History   Marital status: Married    Spouse name: Not on file   Number of children: Not on file   Years of education: Not on file   Highest education level: Not on file  Occupational History   Not on file  Tobacco Use   Smoking status: Never    Passive exposure: Never   Smokeless tobacco: Never  Vaping Use   Vaping Use: Never used  Substance and Sexual Activity   Alcohol use: Not Currently    Alcohol/week: 2.0 standard drinks of alcohol    Types: 2 Shots of liquor per week    Comment: Vodka and lemonade occasionally 2-3/week    Drug use: Yes    Comment: gummies delta A   Sexual activity: Yes    Partners: Female    Comment: Wife  Other Topics Concern   Not on file  Social History Narrative   Currently between jobs   Lives with Wife   1 Son (33) and 1 daughter (55)    2 grandchildren by daughter    1 dog and 1 cat    Enjoys gardening, movies, dining out.    Social Determinants of Health   Financial Resource Strain: Medium Risk (04/01/2023)   Overall Financial Resource Strain (CARDIA)    Difficulty of Paying Living Expenses: Somewhat hard  Food Insecurity: No Food Insecurity (04/01/2023)   Hunger Vital Sign    Worried About Running Out of Food in the Last Year: Never true    Ran Out of Food in the Last Year: Never  true  Transportation Needs: No Transportation Needs (04/01/2023)   PRAPARE - Administrator, Civil Service (Medical): No    Lack of Transportation (Non-Medical): No  Physical Activity: Inactive (04/01/2023)   Exercise Vital Sign    Days of Exercise per Week: 0 days    Minutes of Exercise per Session: 0 min  Stress: No Stress Concern Present (04/01/2023)   [employer] of Occupational Health - Occupational Stress Questionnaire    Feeling of Stress : Only a little  Social Connections: Socially Isolated (04/01/2023)   Social Connection and Isolation Panel [NHANES]    Frequency of Communication with Friends and Family: Once a week    Frequency of Social Gatherings with Friends and Family: Never    Attends Religious Services: Never    Diplomatic Services operational officer: No    Attends Engineer, structural: Never    Marital Status: Married    Tobacco Counseling Counseling given: Not Answered   Clinical Intake:  Pre-visit preparation completed: Yes  Pain : No/denies pain     Diabetes: No  How often do you need to have someone help you when you read instructions, pamphlets, or other written materials from your doctor or pharmacy?: 1 - Never  Interpreter Needed?: No  Information entered by :: Kandis Fantasia LPN   Activities of Daily Living    04/01/2023    3:20 PM 03/30/2023    7:09 AM  In your present state of health, do you have any difficulty performing the following activities:  Hearing? 0 0  Vision? 0 0  Difficulty concentrating or making decisions? 0 0  Walking or climbing stairs? 1 1  Dressing or bathing? 1 1  Doing errands, shopping? 1 0  Preparing Food and eating ? N N  Using the Toilet? N N  In the past six months, have you accidently leaked urine? Malvin Johns  Do you have problems with loss of bowel control? N N  Managing your Medications? N N  Managing your Finances? N N  Housekeeping or managing your Housekeeping? Y N    Patient Care  Team: Sherlene Shams, MD as PCP - General (Internal Medicine) Antonieta Iba, MD as Consulting Physician (Cardiology)  Indicate any recent Medical Services you may have received from other than [facility] providers in the past year (date may be approximate).     Assessment:   This is a routine wellness examination for Jamaar.  Hearing/Vision screen Hearing Screening - Comments:: Denies hearing difficulties   Vision Screening - Comments:: Wears rx glasses - up to date with routine eye exams with [facility])   Dietary issues and exercise activities discussed:     Goals Addressed             This Visit's Progress    Increase physical activity        Depression Screen    04/01/2023    3:21 PM 03/28/2023    2:14 PM 12/15/2022    1:52 PM 09/15/2022    4:20 PM 06/16/2022    2:48 PM 03/18/2022    4:54 PM 02/22/2022   11:27 AM  PHQ 2/9 Scores  PHQ - 2 Score 2 2 3 2 2 6  0  PHQ- 9 Score 7 7 11 7 9 10      Fall Risk    04/01/2023    3:24 PM 03/30/2023    7:09 AM 03/28/2023    2:14 PM 12/15/2022    1:52 PM 09/15/2022    3:12 PM  Fall Risk   Falls in the past year? 1 1 1  0 1  Number falls in past yr: 1 1 1  0 1  Injury with Fall? 1 1 0 0 0  Risk for fall due to : History of fall(s);Impaired balance/gait;Impaired mobility  History of fall(s) No Fall Risks History of fall(s)  Follow up Falls prevention discussed;Falls evaluation completed;Education provided  Falls evaluation completed Falls evaluation completed Falls evaluation completed    MEDICARE RISK AT HOME:  Medicare Risk at Home - 04/01/23 1524     Any stairs in or around the home? No    If so, are there any without handrails? No    Home free of loose throw rugs in walkways, pet beds, electrical cords, etc? Yes    Adequate lighting in your home to reduce risk of falls? Yes    Life alert? No    Use of a cane, walker or w/c? Yes    Grab bars in the bathroom? Yes    Shower chair or bench in shower? Yes     Elevated toilet seat or a handicapped toilet? No             TIMED UP AND GO:  Was the test performed?  No    Cognitive Function:        04/01/2023    3:24 PM  6CIT Screen  What Year? 0 points  What month? 0 points  What time? 0 points  Count back from 20 0 points  Months in reverse 0 points  Repeat phrase 0 points  Total Score 0 points    Immunizations Immunization History  Administered Date(s) Administered   Influenza Split 07/10/2012   Influenza,inj,Quad PF,6+ Mos 07/04/2013   Influenza-Unspecified 08/04/2016, 07/25/2017   PFIZER(Purple Top)SARS-COV-2 Vaccination 07/09/2020, 07/30/2020   PNEUMOCOCCAL CONJUGATE-20 01/05/2021   Tdap 07/10/2012, 03/10/2015  TDAP status: Up to date  Pneumococcal vaccine status: Up to date  Covid-19 vaccine status: Information provided on how to obtain vaccines.   Qualifies for Shingles Vaccine? Yes   Zostavax completed No   Shingrix Completed?: No.    Education has been provided regarding the importance of this vaccine. Patient has been advised to call insurance company to determine out of pocket expense if they have not yet received this vaccine. Advised may also receive vaccine at local pharmacy or Health Dept. Verbalized acceptance and understanding.  Screening Tests Health Maintenance  Topic Date Due   Hepatitis C Screening  Never done   Zoster Vaccines- Shingrix (1 of 2) Never done   Colonoscopy  03/27/2024 (Originally 09/05/2022)   COVID-19 Vaccine (3 - Pfizer risk series) 05/11/2024 (Originally 08/27/2020)   INFLUENZA VACCINE  05/12/2023   Medicare Annual Wellness (AWV)  03/31/2024   DTaP/Tdap/Td (3 - Td or Tdap) 03/09/2025   Pneumonia Vaccine 58+ Years old  Completed   HPV VACCINES  Aged Out    Health Maintenance  Health Maintenance Due  Topic Date Due   Hepatitis C Screening  Never done   Zoster Vaccines- Shingrix (1 of 2) Never done    Colorectal cancer screening: Patient previously referred and  cancelled appointment; plans to call and reschedule   Lung Cancer Screening: (Low Dose CT Chest recommended if Age 20-80 years, 20 pack-year currently smoking OR have quit w/in 15years.) does not qualify.   Lung Cancer Screening Referral: n/a  Additional Screening:  Hepatitis C Screening: does qualify; Patient declines   Vision Screening: Recommended annual ophthalmology exams for early detection of glaucoma and other disorders of the eye. Is the patient up to date with their annual eye exam?  Yes  Who is the provider or what is the name of the office in which the patient attends annual eye exams? [facility] If pt is not established with a provider, would they like to be referred to a provider to establish care? No .   Dental Screening: Recommended annual dental exams for proper oral hygiene  Community Resource Referral / Chronic Care Management: CRR required this visit?  No   CCM required this visit?  No     Plan:     I have personally reviewed and noted the following in the patient's chart:   Medical and social history Use of alcohol, tobacco or illicit drugs  Current medications and supplements including opioid prescriptions. Patient is currently taking opioid prescriptions. Information provided to patient regarding non-opioid alternatives. Patient advised to discuss non-opioid treatment plan with their provider. Functional ability and status Nutritional status Physical activity Advanced directives List of other physicians Hospitalizations, surgeries, and ER visits in previous 12 months Vitals Screenings to include cognitive, depression, and falls Referrals and appointments  In addition, I have reviewed and discussed with patient certain preventive protocols, quality metrics, and best practice recommendations. A written personalized care plan for preventive services as well as general preventive health recommendations were provided to patient.     Kandis Fantasia [place], [place]   1/61/0960   After Visit Summary: (MyChart) Due to this being a telephonic visit, the after visit summary with patients personalized plan was offered to patient via MyChart   Nurse Notes: No concerns     I have reviewed the above information and agree with above.   Duncan Dull, MD

## 2023-04-01 NOTE — Patient Instructions (Signed)
Johnathan [name] , Thank you for taking time to come for your Medicare Wellness Visit. I appreciate your ongoing commitment to your health goals. Please review the following plan we discussed and let me know if I can assist you in the future.   These are the goals we discussed:  Goals      Increase physical activity     Maintain healthy lifestyle     Stay active        This is a list of the screening recommended for you and due dates:  Health Maintenance  Topic Date Due   Hepatitis C Screening  Never done   Zoster (Shingles) Vaccine (1 of 2) Never done   Colon Cancer Screening  03/27/2024*   COVID-19 Vaccine (3 - Pfizer risk series) 05/11/2024*   Flu Shot  05/12/2023   Medicare Annual Wellness Visit  03/31/2024   DTaP/Tdap/Td vaccine (3 - Td or Tdap) 03/09/2025   Pneumonia Vaccine  Completed   HPV Vaccine  Aged Out  *Topic was postponed. The date shown is not the original due date.    Advanced directives: Information on Advanced Care Planning can be found at [facility] Advance Health Care Directives Advance Health Care Directives ([email or web address] Please bring a copy of your health care power of attorney and living will to the office to be added to your chart at your convenience.  Conditions/risks identified: Aim for 30 minutes of exercise or brisk walking, 6-8 glasses of water, and 5 servings of fruits and vegetables each day.  Next appointment: Follow up in one year for your annual wellness visit.   Preventive Care 35 Years and Older, Male  Preventive care refers to lifestyle choices and visits with your health care provider that can promote health and wellness. What does preventive care include? A yearly physical exam. This is also called an annual well check. Dental exams once or twice a year. Routine eye exams. Ask your health care provider how often you should have your eyes checked. Personal lifestyle choices, including: Daily care of your teeth and  gums. Regular physical activity. Eating a healthy diet. Avoiding tobacco and drug use. Limiting alcohol use. Practicing safe sex. Taking low doses of aspirin every day. Taking vitamin and mineral supplements as recommended by your health care provider. What happens during an annual well check? The services and screenings done by your health care provider during your annual well check will depend on your age, overall health, lifestyle risk factors, and family history of disease. Counseling  Your health care provider may ask you questions about your: Alcohol use. Tobacco use. Drug use. Emotional well-being. Home and relationship well-being. Sexual activity. Eating habits. History of falls. Memory and ability to understand (cognition). Work and work Astronomer. Screening  You may have the following tests or measurements: Height, weight, and BMI. Blood pressure. Lipid and cholesterol levels. These may be checked every 5 years, or more frequently if you are over 60 years old. Skin check. Lung cancer screening. You may have this screening every year starting at age 70 if you have a 30-pack-year history of smoking and currently smoke or have quit within the past 15 years. Fecal occult blood test (FOBT) of the stool. You may have this test every year starting at age 95. Flexible sigmoidoscopy or colonoscopy. You may have a sigmoidoscopy every 5 years or a colonoscopy every 10 years starting at age 36. Prostate cancer screening. Recommendations will vary depending on your family history and other  risks. Hepatitis C blood test. Hepatitis B blood test. Sexually transmitted disease (STD) testing. Diabetes screening. This is done by checking your blood sugar (glucose) after you have not eaten for a while (fasting). You may have this done every 1-3 years. Abdominal aortic aneurysm (AAA) screening. You may need this if you are a current or former smoker. Osteoporosis. You may be screened  starting at age 21 if you are at high risk. Talk with your health care provider about your test results, treatment options, and if necessary, the need for more tests. Vaccines  Your health care provider may recommend certain vaccines, such as: Influenza vaccine. This is recommended every year. Tetanus, diphtheria, and acellular pertussis (Tdap, Td) vaccine. You may need a Td booster every 10 years. Zoster vaccine. You may need this after age 75. Pneumococcal 13-valent conjugate (PCV13) vaccine. One dose is recommended after age 65. Pneumococcal polysaccharide (PPSV23) vaccine. One dose is recommended after age 74. Talk to your health care provider about which screenings and vaccines you need and how often you need them. This information is not intended to replace advice given to you by your health care provider. Make sure you discuss any questions you have with your health care provider. Document Released: 10/24/2015 Document Revised: 06/16/2016 Document Reviewed: 07/29/2015 Elsevier Interactive Patient Education  2017 [employer].  Fall Prevention in the Home Falls can cause injuries. They can happen to people of all ages. There are many things you can do to make your home safe and to help prevent falls. What can I do on the outside of my home? Regularly fix the edges of walkways and driveways and fix any cracks. Remove anything that might make you trip as you walk through a door, such as a raised step or threshold. Trim any bushes or trees on the path to your home. Use bright outdoor lighting. Clear any walking paths of anything that might make someone trip, such as rocks or tools. Regularly check to see if handrails are loose or broken. Make sure that both sides of any steps have handrails. Any raised decks and porches should have guardrails on the edges. Have any leaves, snow, or ice cleared regularly. Use sand or salt on walking paths during winter. Clean up any spills in your garage  right away. This includes oil or grease spills. What can I do in the bathroom? Use night lights. Install grab bars by the toilet and in the tub and shower. Do not use towel bars as grab bars. Use non-skid mats or decals in the tub or shower. If you need to sit down in the shower, use a plastic, non-slip stool. Keep the floor dry. Clean up any water that spills on the floor as soon as it happens. Remove soap buildup in the tub or shower regularly. Attach bath mats securely with double-sided non-slip rug tape. Do not have throw rugs and other things on the floor that can make you trip. What can I do in the bedroom? Use night lights. Make sure that you have a light by your bed that is easy to reach. Do not use any sheets or blankets that are too big for your bed. They should not hang down onto the floor. Have a firm chair that has side arms. You can use this for support while you get dressed. Do not have throw rugs and other things on the floor that can make you trip. What can I do in the kitchen? Clean up any spills right away.  Avoid walking on wet floors. Keep items that you use a lot in easy-to-reach places. If you need to reach something above you, use a strong step stool that has a grab bar. Keep electrical cords out of the way. Do not use floor polish or wax that makes floors slippery. If you must use wax, use non-skid floor wax. Do not have throw rugs and other things on the floor that can make you trip. What can I do with my stairs? Do not leave any items on the stairs. Make sure that there are handrails on both sides of the stairs and use them. Fix handrails that are broken or loose. Make sure that handrails are as long as the stairways. Check any carpeting to make sure that it is firmly attached to the stairs. Fix any carpet that is loose or worn. Avoid having throw rugs at the top or bottom of the stairs. If you do have throw rugs, attach them to the floor with carpet tape. Make  sure that you have a light switch at the top of the stairs and the bottom of the stairs. If you do not have them, ask someone to add them for you. What else can I do to help prevent falls? Wear shoes that: Do not have high heels. Have rubber bottoms. Are comfortable and fit you well. Are closed at the toe. Do not wear sandals. If you use a stepladder: Make sure that it is fully opened. Do not climb a closed stepladder. Make sure that both sides of the stepladder are locked into place. Ask someone to hold it for you, if possible. Clearly mark and make sure that you can see: Any grab bars or handrails. First and last steps. Where the edge of each step is. Use tools that help you move around (mobility aids) if they are needed. These include: Canes. Walkers. Scooters. Crutches. Turn on the lights when you go into a dark area. Replace any light bulbs as soon as they burn out. Set up your furniture so you have a clear path. Avoid moving your furniture around. If any of your floors are uneven, fix them. If there are any pets around you, be aware of where they are. Review your medicines with your doctor. Some medicines can make you feel dizzy. This can increase your chance of falling. Ask your doctor what other things that you can do to help prevent falls. This information is not intended to replace advice given to you by your health care provider. Make sure you discuss any questions you have with your health care provider. Document Released: 07/24/2009 Document Revised: 03/04/2016 Document Reviewed: 11/01/2014 Elsevier Interactive Patient Education  2017 [employer].

## 2023-04-15 ENCOUNTER — Encounter: Payer: Self-pay | Admitting: Neurology

## 2023-04-15 ENCOUNTER — Other Ambulatory Visit: Payer: Self-pay

## 2023-04-15 DIAGNOSIS — R202 Paresthesia of skin: Secondary | ICD-10-CM

## 2023-04-20 ENCOUNTER — Ambulatory Visit: Payer: PPO | Attending: Cardiovascular Disease

## 2023-04-20 DIAGNOSIS — I4819 Other persistent atrial fibrillation: Secondary | ICD-10-CM | POA: Diagnosis not present

## 2023-04-20 DIAGNOSIS — Z5181 Encounter for therapeutic drug level monitoring: Secondary | ICD-10-CM

## 2023-04-20 DIAGNOSIS — Z86718 Personal history of other venous thrombosis and embolism: Secondary | ICD-10-CM | POA: Diagnosis not present

## 2023-04-20 LAB — POCT INR: INR: 3.2 — AB (ref 2.0–3.0)

## 2023-04-20 NOTE — Patient Instructions (Signed)
Continue taking Warfarin 1 tablet daily except  1.5 tablets on MONDAYS AND FRIDAYS.  INR in 4 weeks; eat greens tonight

## 2023-04-22 ENCOUNTER — Ambulatory Visit (INDEPENDENT_AMBULATORY_CARE_PROVIDER_SITE_OTHER): Payer: PPO | Admitting: Neurology

## 2023-04-22 DIAGNOSIS — R202 Paresthesia of skin: Secondary | ICD-10-CM | POA: Diagnosis not present

## 2023-04-22 DIAGNOSIS — G5601 Carpal tunnel syndrome, right upper limb: Secondary | ICD-10-CM

## 2023-04-22 DIAGNOSIS — M5412 Radiculopathy, cervical region: Secondary | ICD-10-CM

## 2023-04-22 DIAGNOSIS — G5621 Lesion of ulnar nerve, right upper limb: Secondary | ICD-10-CM

## 2023-04-22 NOTE — Procedures (Signed)
  [facility]  [address] [place], [address]  [place], [place] [address] Tel: [phone] Fax: [phone] Test Date:  04/22/2023  Patient: Johnathan [name] DOB: 1956-08-26 Physician: Nita Sickle, DO  Sex: Male Height: 6\' 2"  Ref Phys: Antoine Primas, DO  ID#: [id number]   Technician:    History: This is a 67 year old man with right wrist pain.  NCV & EMG Findings: Extensive electrodiagnostic testing of the right upper extremity shows:  Right median sensory response shows prolonged latency (R5.5 ms) and borderline normal amplitude.  Right ulnar sensory response shows prolonged latency (R5.4 ms).  Right radial sensory responses within normal limits. Right median motor response shows prolonged latency (6.2 ms) and reduced amplitude (4.1 mV).  Right ulnar motor response shows latency (3.3 ms), reduced amplitude (4.2 mV), and decreased conduction velocity (A Elbow-B Elbow, 37 m/s). Chronic motor axon loss changes are seen affecting the first dorsal interosseous, abductor digiti minimi, flexor carpi ulnaris, abductor pollicis brevis, biceps, and pronator teres muscles.  There is no evidence of accompanying active denervation.   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   Right ulnar neuropathy with slowing across the elbow, with demyelinating and axonal features (moderate). Chronic C6 radiculopathy affecting the right upper extremity, moderate.   ___________________________ Nita Sickle, DO    Nerve Conduction Studies   Stim Site NR Peak (ms) Norm Peak (ms) O-P Amp (V) Norm O-P Amp  Right Median Anti Sensory (2nd Digit)  32 C  Wrist    *5.5 <3.8 10.3 >10  Right Radial Anti Sensory (Base 1st Digit)  32 C  Wrist    2.4 <2.8 11.9 >10  Right Ulnar Anti Sensory (5th Digit)  32 C  Wrist    *5.4 <3.2 7.4 >5     Stim Site NR Onset (ms) Norm Onset (ms) O-P Amp (mV) Norm O-P Amp Site1  Site2 Delta-0 (ms) Dist (cm) Vel (m/s) Norm Vel (m/s)  Right Median Motor (Abd Poll Brev)  32 C  Wrist    *6.2 <4.0 *4.1 >5 Elbow Wrist 8.3 37.0 51 >50  Elbow    14.5  4.1         Right Ulnar Motor (Abd Dig Minimi)  32 C  Wrist    *3.3 <3.1 *4.2 >7 B Elbow Wrist 4.6 28.0 61 >50  B Elbow    7.9  4.2  A Elbow B Elbow 2.7 10.0 *37 >50  A Elbow    10.6  3.9          Electromyography   Side Muscle Ins.Act Fibs Fasc Recrt Amp Dur Poly Activation Comment  Right 1stDorInt Nml Nml Nml *2- *1+ *1+ *1+ Nml N/A  Right Abd Poll Brev Nml Nml Nml *2- *1+ *1+ *1+ Nml N/A  Right PronatorTeres Nml Nml Nml *2- *2+ *1+ *1+ Nml N/A  Right Biceps Nml Nml Nml *2- *2+ *1+ *1+ Nml N/A  Right Triceps Nml Nml Nml Nml Nml Nml Nml Nml N/A  Right Deltoid Nml Nml Nml Nml Nml Nml Nml Nml N/A  Right Abd Dig Min Nml Nml Nml *2- *1+ *1+ *1+ Nml N/A  Right Ext Indicis Nml Nml Nml Nml Nml Nml Nml Nml N/A  Right FlexCarpiUln Nml Nml Nml *2- *1+ *1+ *1+ Nml N/A      Waveforms:

## 2023-04-25 NOTE — Progress Notes (Signed)
Cardiology Office Note  Date:  05/02/2023   ID:  Johnathan [name], DOB 11/15/1955, MRN [id number]  PCP:  Johnathan Shams, MD   Chief Complaint  Patient presents with   Follow-up    6 month follow up.  Patient denies new or acute cardiac problems/concerns today.      HPI:  [name] is a pleasant 67 year old gentleman with  Morbid obesity chronic joint pain of the shoulders and knees on long-term low-dose narcotics,  hypertension,  bariatric surgery,  atrial fibrillation in August 2013,   normal ejection fraction at that time  sleep apnea, currently not on his CPAP DVt on right LE Echo with EF 40 to 45% in 2016 who presents for follow-up of his atrial fibrillation.   Last seen by myself in clinic December 2023   Needs wrist surgery, lots of arthritis Uses a walker No change in swelling, no PND orthopnea Does water aerobics  Recently "Low on energy", sedentary Little bit of SOB, right shoulder pain on exertion, goes away on sitting down   on Ozempic Not taking lasix, leg swelling stable  Trouble walking long distance "Broken shoulder", followed by orthopedics Prior history of falls, [facility] bathroom among other falls with orthopedic injuries  EKG personally reviewed by myself on todays visit EKG Interpretation Date/Time:  Monday May 02 2023 08:49:24 EDT Ventricular Rate:  83 PR Interval:    QRS Duration:  104 QT Interval:  396 QTC Calculation: 465 R Axis:   7  Text Interpretation: Atrial fibrillation with Premature supraventricular complexes and with occasional Premature ventricular complexes When compared with ECG of 09-Jun-2017 11:05, Vent. rate has increased BY  30 BPM Confirmed by Johnathan [name] [phone]) on 05/02/2023 9:06:30 AM    total knee replacement b/l 2019  Other past medical history reviewed Echo 2016  - Left ventricle: The cavity size was normal. There was mild   concentric hypertrophy. Systolic function was mildly to   moderately reduced. The  estimated ejection fraction was in the   range of 40% to 45%. Images were inadequate for LV wall motion   assessment. - Mitral valve: There was mild regurgitation. - Left atrium: The atrium was severely dilated. - Pulmonary arteries: Systolic pressure was within the normal   range.   previous cardioversion,successful in restoring normal sinus rhythm    tick bite in June 2016, he developed atrial fibrillation initially with RVR. Started on digoxin, continued on metoprolol and diltiazem with improved heart rate. Echocardiogram and stress test at that time Cardiac exam shows slightly depressed ejection fraction, dilated left atrium, mild LVH. Ejection fraction estimated at 40-45%. Stress test with small region of apical perfusion defect, depressed ejection fraction.    PMH:   has a past medical history of Arthritis, B12 deficiency, Chronic systolic CHF (congestive heart failure) (HCC), Complication of anesthesia, Dysrhythmia, Erythema migrans (Lyme disease) (04/08/2015), History of DVT (deep vein thrombosis) (2004), History of stress test, Hypertension, Hypothyroidism, Neuropathy, Obesity, OSA (obstructive sleep apnea), PAF (paroxysmal atrial fibrillation) (HCC), and Pre-diabetes.  PSH:    Past Surgical History:  Procedure Laterality Date   ARTHROSCOPIC REPAIR ACL Bilateral 1993   bilateral knee x2 on left and x2 on right   BARIATRIC SURGERY  2004   Rou en Y    BARIATRIC SURGERY     CHOLECYSTECTOMY  1982   ELECTROPHYSIOLOGIC STUDY N/A 06/20/2015   Procedure: CARDIOVERSION;  Surgeon: Johnathan Iba, MD;  Location: [facility] ORS;  Service: Cardiovascular;  Laterality: N/A;   GANGLION CYST  EXCISION     KNEE ARTHROPLASTY Right 06/20/2017   Procedure: COMPUTER ASSISTED TOTAL KNEE ARTHROPLASTY;  Surgeon: Johnathan Heinz, MD;  Location: [facility] ORS;  Service: Orthopedics;  Laterality: Right;   MENISCUS DEBRIDEMENT Left 2011   Left knee   REPLACEMENT TOTAL KNEE Left 07/2013   REVERSE SHOULDER  ARTHROPLASTY Right 12/30/2022   Procedure: REVERSE SHOULDER ARTHROPLASTY;  Surgeon: Johnathan Broom, MD;  Location: [facility] ORS;  Service: Orthopedics;  Laterality: Right;   TONSILLECTOMY     Current Outpatient Medications on File Prior to Visit  Medication Sig Dispense Refill   amiodarone (PACERONE) 200 MG tablet Take 1 tablet by mouth twice daily 180 tablet 0   B Complex Vitamins (B COMPLEX 50 PO) Take 1 tablet by mouth daily.      buPROPion (WELLBUTRIN XL) 300 MG 24 hr tablet Take 1 tablet (300 mg total) by mouth daily. 90 tablet 3   Cholecalciferol (VITAMIN D3) 2000 UNITS TABS Take 2,000 Units by mouth daily.     docusate sodium (COLACE) 100 MG capsule Take 400 mg by mouth daily.      Ferrous Gluconate 324 (37.5 Fe) MG TABS Take 1 tablet by mouth daily.      furosemide (LASIX) 40 MG tablet Take 1 tablet (40 mg total) by mouth daily as needed. 90 tablet 0   gabapentin (NEURONTIN) 300 MG capsule TAKE 1 CAPSULE BY MOUTH THREE TIMES A DAY 270 capsule 3   hydrochlorothiazide (HYDRODIURIL) 25 MG tablet Take 1 tablet by mouth once daily 90 tablet 3   HYDROcodone-acetaminophen (NORCO) 10-325 MG tablet Take 1 tablet by mouth every 8 (eight) hours as needed. 90 tablet 0   HYDROcodone-acetaminophen (NORCO) 10-325 MG tablet Take 1 tablet by mouth every 8 (eight) hours as needed. 90 tablet 0   levothyroxine (SYNTHROID) 25 MCG tablet TAKE 1 TABLET BY MOUTH ONCE DAILY BEFORE  BREAKFAST 90 tablet 1   losartan (COZAAR) 100 MG tablet Take 1 tablet (100 mg total) by mouth daily. 90 tablet 3   metoprolol succinate (TOPROL-XL) 50 MG 24 hr tablet TAKE 1 TABLET BY MOUTH DAILY. 90 tablet 1   Multiple Vitamin (MULTIVITAMIN) tablet Take 1 tablet by mouth daily.     Potassium Chloride ER 20 MEQ TBCR Take 20 mEq by mouth 2 (two) times daily as needed. 180 tablet 3   propranolol (INDERAL) 20 MG tablet Take 1 tablet (20 mg total) by mouth 3 (three) times daily as needed. Reported on 03/22/2016 270 tablet 3   rosuvastatin  (CRESTOR) 10 MG tablet Take 1 tablet (10 mg total) by mouth daily at 2 PM. 90 tablet 3   sildenafil (REVATIO) 20 MG tablet Take 1 tablet (20 mg total) by mouth 3 (three) times daily. 90 tablet 1   tadalafil (CIALIS) 20 MG tablet Take 1 tablet (20 mg total) by mouth daily as needed for erectile dysfunction. 30 tablet 1   venlafaxine XR (EFFEXOR-XR) 150 MG 24 hr capsule Take 1 capsule (150 mg total) by mouth daily with breakfast. 90 capsule 3   vitamin B-12 (CYANOCOBALAMIN) 500 MCG tablet Take 500 mcg by mouth daily.     vitamin E 400 UNIT capsule Take 400 Units by mouth daily.     warfarin (COUMADIN) 5 MG tablet TAKE 1 TABLET TO 1.5 TABLETS BY MOUTH DAILY AS DIRECTED BY ANTI-COAG CLINIC (Patient taking differently: Take 5-7.5 mg by mouth See admin instructions. Take 7.5 mg by mouth on Monday, Wednesday and Friday and take 5 mg on Tuesday, Thursday,  Saturday and Sunday.) 110 tablet 0   GLUCOSAMINE HCL-MSM PO Take 1 tablet by mouth daily.  (Patient not taking: Reported on 05/02/2023)     tiZANidine (ZANAFLEX) 4 MG tablet Take 1 tablet (4 mg total) by mouth every 8 (eight) hours as needed for muscle spasms. (Patient not taking: Reported on 05/02/2023) 30 tablet 0   No current facility-administered medications on file prior to visit.     Allergies:   Eliquis [apixaban] and Xarelto [rivaroxaban]   Social History:  The patient  reports that he has never smoked. He has never been exposed to tobacco smoke. He has never used smokeless tobacco. He reports that he does not currently use alcohol after a past usage of about 2.0 standard drinks of alcohol per week. He reports current drug use.   Family History:   family history includes Alzheimer's disease in his father; Dementia in his mother; Diabetes in his father.    Review of Systems: Review of Systems  Constitutional: Negative.   Respiratory:  Positive for shortness of breath.   Cardiovascular:  Positive for leg swelling.  Gastrointestinal: Negative.    Musculoskeletal:  Positive for falls and joint pain.       Gait instability  Neurological: Negative.   Psychiatric/Behavioral: Negative.    All other systems reviewed and are negative.   PHYSICAL EXAM: VS:  BP 108/78 (BP Location: Left Arm, Patient Position: Sitting, Cuff Size: Large)   Pulse 83   Ht 6\' 3"  (1.905 m)   Wt (!) 365 lb 12.8 oz (165.9 kg)   SpO2 94%   BMI 45.72 kg/m  , BMI Body mass index is 45.72 kg/m. Constitutional:  oriented to person, place, and time. No distress.  HENT:  Head: Grossly normal Eyes:  no discharge. No scleral icterus.  Neck: No JVD, no carotid bruits  Cardiovascular: Irregularly irregular no murmurs appreciated Pulmonary/Chest: Clear to auscultation bilaterally, no wheezes or rails Abdominal: Soft.  no distension.  no tenderness.  Musculoskeletal: Normal range of motion Neurological:  normal muscle tone. Coordination normal. No atrophy Skin: Skin warm and dry Psychiatric: normal affect, pleasant   Recent Labs: 03/28/2023: ALT 20; BUN 20; Creatinine, Ser 1.11; Hemoglobin 13.7; Platelets 165.0; Potassium 4.0; Sodium 141; TSH 3.72    Lipid Panel Lab Results  Component Value Date   CHOL 121 03/28/2023   HDL 43.30 03/28/2023   LDLCALC 57 03/28/2023   TRIG 108.0 03/28/2023      Wt Readings from Last 3 Encounters:  05/02/23 (!) 365 lb 12.8 oz (165.9 kg)  04/01/23 (!) 368 lb (166.9 kg)  03/28/23 (!) 368 lb (166.9 kg)     ASSESSMENT AND PLAN:  Preop risk wrist surgery Acceptable risk for surgery of wrist No further testing needed  Atrial fibrillation, unspecified type (HCC) - Plan: EKG 12-Lead Back in atrial fibrillation today Recommend he continue amiodarone, metoprolol, warfarin Has noticed lower energy, mild shortness of breath on exertion He prefers to wait on cardioversion until after potential risk surgery, he will call to let us know when he is ready Discussed that he will need 4 weeks of therapeutic INR before  cardioversion  Essential hypertension - Plan: EKG 12-Lead Blood pressure is well controlled on today's visit. No changes made to the medications.  Acute on chronic diastolic and systolic CHF (congestive heart failure) (HCC)  trace lower extremity edema right lower extremity, also had DVT on that side Has not required Lasix on a regular basis, appears euvolemic  Severe obesity (BMI >= 40) (  HCC) Carbohydrate restriction Unable to exercise secondary to gait instability, debility  Chronic joint pain, neck pain Unable to exercise secondary to chronic knee pain  On pain medication for pain control   Total encounter time more than 40 minutes  Greater than 50% was spent in counseling and coordination of care with the patient    Orders Placed This Encounter  Procedures   EKG 12-Lead     Signed, Dossie Arbour, M.D., Ph.D. 05/02/2023  [facility] [place], [place] [phone]

## 2023-04-28 ENCOUNTER — Telehealth: Payer: Self-pay | Admitting: Internal Medicine

## 2023-04-28 MED ORDER — HYDROCODONE-ACETAMINOPHEN 10-325 MG PO TABS: 1.0000 | ORAL_TABLET | Freq: Three times a day (TID) | ORAL | 0 refills | Status: DC | PRN

## 2023-04-28 NOTE — Telephone Encounter (Signed)
Requesting: Hydrocodone Contract: No UDS: No Last Visit: 03/28/2023 Next Visit: 06/28/2023 Last Refill: 03/28/2023  Please Advise

## 2023-04-28 NOTE — Telephone Encounter (Signed)
Refills for July and August sent

## 2023-04-28 NOTE — Telephone Encounter (Signed)
Prescription Request  04/28/2023  LOV: 03/28/2023  What is the name of the medication or equipment? HYDROcodone   Have you contacted your pharmacy to request a refill? No   Which pharmacy would you like this sent to? [facility]   Patient notified that their request is being sent to the clinical staff for review and that they should receive a response within 2 business days.   Please advise at Mobile [phone] (mobile)

## 2023-04-29 NOTE — Telephone Encounter (Signed)
Pt is aware.  

## 2023-04-30 ENCOUNTER — Other Ambulatory Visit: Payer: Self-pay | Admitting: Cardiovascular Disease

## 2023-05-02 ENCOUNTER — Encounter: Payer: Self-pay | Admitting: Cardiovascular Disease

## 2023-05-02 ENCOUNTER — Ambulatory Visit: Payer: PPO | Attending: Cardiovascular Disease | Admitting: Cardiovascular Disease

## 2023-05-02 VITALS — BP 108/78 | HR 83 | Ht 75.0 in | Wt 365.8 lb

## 2023-05-02 DIAGNOSIS — I872 Venous insufficiency (chronic) (peripheral): Secondary | ICD-10-CM

## 2023-05-02 DIAGNOSIS — E7849 Other hyperlipidemia: Secondary | ICD-10-CM

## 2023-05-02 DIAGNOSIS — I10 Essential (primary) hypertension: Secondary | ICD-10-CM

## 2023-05-02 DIAGNOSIS — I4819 Other persistent atrial fibrillation: Secondary | ICD-10-CM | POA: Diagnosis not present

## 2023-05-02 DIAGNOSIS — Z86718 Personal history of other venous thrombosis and embolism: Secondary | ICD-10-CM

## 2023-05-02 DIAGNOSIS — I420 Dilated cardiomyopathy: Secondary | ICD-10-CM

## 2023-05-02 NOTE — Patient Instructions (Signed)
Please call when you would like to schedule a cardioversion  Medication Instructions:  No changes  If you need a refill on your cardiac medications before your next appointment, please call your pharmacy.   Lab work: No new labs needed  Testing/Procedures: No new testing needed  Follow-Up: At [facility], you and your health needs are our priority.  As part of our continuing mission to provide you with exceptional heart care, we have created designated Provider Care Teams.  These Care Teams include your primary Cardiologist (physician) and Advanced Practice Providers (APPs -  Physician Assistants and Nurse Practitioners) who all work together to provide you with the care you need, when you need it.  You will need a follow up appointment in 6 months  Providers on your designated Care Team:   Nicolasa Ducking, NP Eula Listen, PA-C Cadence Fransico [name], [place]  COVID-19 Vaccine Information can be found at: [email or web address] For questions related to vaccine distribution or appointments, please email [email or web address] or call [phone].

## 2023-05-12 ENCOUNTER — Ambulatory Visit
Admission: RE | Admit: 2023-05-12 | Discharge: 2023-05-12 | Disposition: A | Discharge status: Home / Self Care | Payer: PPO | Source: Ambulatory Visit | Attending: Family Medicine | Admitting: Family Medicine

## 2023-05-12 DIAGNOSIS — M25531 Pain in right wrist: Secondary | ICD-10-CM

## 2023-05-12 MED ORDER — GADOPICLENOL 0.5 MMOL/ML IV SOLN
10.0000 mL | Freq: Once | INTRAVENOUS | Status: AC | PRN
  Administered 2023-05-12: 10 mL via INTRAVENOUS

## 2023-05-18 ENCOUNTER — Ambulatory Visit: Payer: PPO

## 2023-05-18 DIAGNOSIS — I4819 Other persistent atrial fibrillation: Secondary | ICD-10-CM | POA: Diagnosis not present

## 2023-05-18 DIAGNOSIS — Z5181 Encounter for therapeutic drug level monitoring: Secondary | ICD-10-CM | POA: Diagnosis not present

## 2023-05-18 DIAGNOSIS — Z86718 Personal history of other venous thrombosis and embolism: Secondary | ICD-10-CM

## 2023-05-18 LAB — POCT INR: INR: 2.5 (ref 2.0–3.0)

## 2023-05-18 NOTE — Patient Instructions (Signed)
Continue taking Warfarin 1 tablet daily except  1.5 tablets on MONDAYS AND FRIDAYS.  INR in 6 weeks

## 2023-05-26 ENCOUNTER — Encounter (INDEPENDENT_AMBULATORY_CARE_PROVIDER_SITE_OTHER): Payer: Self-pay

## 2023-06-07 ENCOUNTER — Encounter: Payer: Self-pay | Admitting: Family Medicine

## 2023-06-07 ENCOUNTER — Other Ambulatory Visit: Payer: Self-pay

## 2023-06-07 DIAGNOSIS — G5601 Carpal tunnel syndrome, right upper limb: Secondary | ICD-10-CM

## 2023-06-28 ENCOUNTER — Encounter: Payer: Self-pay | Admitting: Internal Medicine

## 2023-06-28 ENCOUNTER — Ambulatory Visit (INDEPENDENT_AMBULATORY_CARE_PROVIDER_SITE_OTHER): Payer: PPO | Admitting: Internal Medicine

## 2023-06-28 VITALS — BP 134/88 | HR 47 | Temp 98.7°F | Ht 75.0 in | Wt 369.6 lb

## 2023-06-28 DIAGNOSIS — E559 Vitamin D deficiency, unspecified: Secondary | ICD-10-CM

## 2023-06-28 DIAGNOSIS — I10 Essential (primary) hypertension: Secondary | ICD-10-CM | POA: Diagnosis not present

## 2023-06-28 DIAGNOSIS — E039 Hypothyroidism, unspecified: Secondary | ICD-10-CM

## 2023-06-28 DIAGNOSIS — E538 Deficiency of other specified B group vitamins: Secondary | ICD-10-CM | POA: Diagnosis not present

## 2023-06-28 DIAGNOSIS — G4733 Obstructive sleep apnea (adult) (pediatric): Secondary | ICD-10-CM

## 2023-06-28 DIAGNOSIS — M19031 Primary osteoarthritis, right wrist: Secondary | ICD-10-CM

## 2023-06-28 MED ORDER — HYDROCODONE-ACETAMINOPHEN 10-325 MG PO TABS: 1.0000 | ORAL_TABLET | Freq: Three times a day (TID) | ORAL | 0 refills | Status: DC | PRN

## 2023-06-28 MED ORDER — LEVOTHYROXINE SODIUM 25 MCG PO TABS: 25.0000 ug | ORAL_TABLET | Freq: Every day | ORAL | 1 refills | Status: DC

## 2023-06-28 MED ORDER — SILDENAFIL CITRATE 20 MG PO TABS: 20.0000 mg | ORAL_TABLET | Freq: Three times a day (TID) | ORAL | 1 refills | Status: DC

## 2023-06-28 MED ORDER — METOPROLOL SUCCINATE ER 50 MG PO TB24: ORAL_TABLET | ORAL | 1 refills | Status: DC

## 2023-06-28 NOTE — Assessment & Plan Note (Signed)
Improving control on current regimen of losartan, hctz  And metoprolol. No changes today

## 2023-06-28 NOTE — Progress Notes (Signed)
Subjective:  Patient ID: Johnathan [name], male    DOB: 06/26/1956  Age: 67 y.o. MRN: [id number]  CC: The primary encounter diagnosis was B12 deficiency. Diagnoses of Vitamin D deficiency, Essential hypertension, Acquired hypothyroidism, Arthritis of right wrist, OSA (obstructive sleep apnea), and Morbid obesity (HCC) were also pertinent to this visit.   HPI Johnathan [name] presents for  Chief Complaint  Patient presents with   Medical Management of Chronic Issues    3 month follow up     1) right wrist pain :  has been referred to [facility] hand surgeon Gramig  for surgery. Reviewed MRI results:  Complete tear of the scapholunate ligament. Lunotriquetral ligament is attenuated concerning for a complete tear. Volar tilting of the lunate as can be seen with VISI. 2. Severe degeneration of the TFCC with a tear along the carpal surface. 3. Mild tendinosis of the extensor carpi ulnaris tendon with mild tenosynovitis. 4. Large amount of fluid in the extensor pollicis brevis and abductor pollicis longus tendon sheaths with synechiae as can be seen with severe stenosing tenosynovitis. 5. Severe osteoarthritis of the radiocarpal joint. Severe reactive marrow edema throughout the triquetrum. 6. Severe osteoarthritis of the first [facility] joint and first MCP joint. 7. Moderate wrist joint effusion with synovitis.    2) chronic pain of neck wrist (see above)   3) frequent falls:  fell through the front door when he planted his foot incorrectly and rolled it, .  Uses a rollinator in the house and uses the buggy at [employer]  4) Atrial fibrillation :recurrent.  Rate controlled. Asymptomatic.   Reviewed last OV with cardioloy .    saw Gollan . No procedures planned for the immediate future . Marland Kitchen   Outpatient Medications Prior to Visit  Medication Sig Dispense Refill   amiodarone (PACERONE) 200 MG tablet Take 1 tablet by mouth twice daily 180 tablet 0   B Complex Vitamins (B COMPLEX 50 PO) Take 1 tablet by mouth  daily.      buPROPion (WELLBUTRIN XL) 300 MG 24 hr tablet Take 1 tablet (300 mg total) by mouth daily. 90 tablet 3   Cholecalciferol (VITAMIN D3) 2000 UNITS TABS Take 2,000 Units by mouth daily.     docusate sodium (COLACE) 100 MG capsule Take 400 mg by mouth daily.      Ferrous Gluconate 324 (37.5 Fe) MG TABS Take 1 tablet by mouth daily.      furosemide (LASIX) 40 MG tablet Take 1 tablet (40 mg total) by mouth daily as needed. 90 tablet 0   gabapentin (NEURONTIN) 300 MG capsule TAKE 1 CAPSULE BY MOUTH THREE TIMES A DAY 270 capsule 3   GLUCOSAMINE HCL-MSM PO Take 1 tablet by mouth daily.     hydrochlorothiazide (HYDRODIURIL) 25 MG tablet Take 1 tablet by mouth once daily 90 tablet 3   HYDROcodone-acetaminophen (NORCO) 10-325 MG tablet Take 1 tablet by mouth every 8 (eight) hours as needed. 90 tablet 0   losartan (COZAAR) 100 MG tablet Take 1 tablet (100 mg total) by mouth daily. 90 tablet 3   Multiple Vitamin (MULTIVITAMIN) tablet Take 1 tablet by mouth daily.     Potassium Chloride ER 20 MEQ TBCR Take 20 mEq by mouth 2 (two) times daily as needed. 180 tablet 3   propranolol (INDERAL) 20 MG tablet Take 1 tablet (20 mg total) by mouth 3 (three) times daily as needed. Reported on 03/22/2016 270 tablet 3   rosuvastatin (CRESTOR) 10 MG tablet Take  1 tablet (10 mg total) by mouth daily at 2 PM. 90 tablet 3   tadalafil (CIALIS) 20 MG tablet Take 1 tablet (20 mg total) by mouth daily as needed for erectile dysfunction. 30 tablet 1   tiZANidine (ZANAFLEX) 4 MG tablet Take 1 tablet (4 mg total) by mouth every 8 (eight) hours as needed for muscle spasms. 30 tablet 0   venlafaxine XR (EFFEXOR-XR) 150 MG 24 hr capsule Take 1 capsule (150 mg total) by mouth daily with breakfast. 90 capsule 3   vitamin B-12 (CYANOCOBALAMIN) 500 MCG tablet Take 500 mcg by mouth daily.     vitamin E 400 UNIT capsule Take 400 Units by mouth daily.     warfarin (COUMADIN) 5 MG tablet TAKE 1 TABLET TO 1.5 TABLETS BY MOUTH DAILY AS  DIRECTED BY ANTI-COAG CLINIC (Patient taking differently: Take 5-7.5 mg by mouth See admin instructions. Take 7.5 mg by mouth on Monday, Wednesday and Friday and take 5 mg on Tuesday, Thursday, Saturday and Sunday.) 110 tablet 0   HYDROcodone-acetaminophen (NORCO) 10-325 MG tablet Take 1 tablet by mouth every 8 (eight) hours as needed. 90 tablet 0   levothyroxine (SYNTHROID) 25 MCG tablet TAKE 1 TABLET BY MOUTH ONCE DAILY BEFORE  BREAKFAST 90 tablet 1   metoprolol succinate (TOPROL-XL) 50 MG 24 hr tablet TAKE 1 TABLET BY MOUTH DAILY. 90 tablet 1   sildenafil (REVATIO) 20 MG tablet Take 1 tablet (20 mg total) by mouth 3 (three) times daily. 90 tablet 1   No facility-administered medications prior to visit.    Review of Systems;  Patient denies headache, fevers, malaise, unintentional weight loss, skin rash, eye pain, sinus congestion and sinus pain, sore throat, dysphagia,  hemoptysis , cough, dyspnea, wheezing, chest pain, palpitations, orthopnea, edema, abdominal pain, nausea, melena, diarrhea, constipation, flank pain, dysuria, hematuria, urinary  Frequency, nocturia, numbness, tingling, seizures,  Focal weakness, Loss of consciousness,  Tremor, insomnia, depression, anxiety, and suicidal ideation.      Objective:  BP 134/88   Pulse (!) 47   Temp 98.7 F (37.1 C) (Oral)   Ht 6\' 3"  (1.905 m)   Wt (!) 369 lb 9.6 oz (167.6 kg)   SpO2 97%   BMI 46.20 kg/m   BP Readings from Last 3 Encounters:  06/28/23 134/88  05/02/23 108/78  03/28/23 138/82    Wt Readings from Last 3 Encounters:  06/28/23 (!) 369 lb 9.6 oz (167.6 kg)  05/02/23 (!) 365 lb 12.8 oz (165.9 kg)  04/01/23 (!) 368 lb (166.9 kg)    Physical Exam Vitals reviewed.  Constitutional:      General: He is not in acute distress.    Appearance: Normal appearance. He is normal weight. He is not ill-appearing, toxic-appearing or diaphoretic.  HENT:     Head: Normocephalic.  Eyes:     General: No scleral icterus.        Right eye: No discharge.        Left eye: No discharge.     Conjunctiva/sclera: Conjunctivae normal.  Cardiovascular:     Rate and Rhythm: Normal rate and regular rhythm.     Heart sounds: Normal heart sounds.  Pulmonary:     Effort: Pulmonary effort is normal. No respiratory distress.     Breath sounds: Normal breath sounds.  Musculoskeletal:        General: Normal range of motion.     Cervical back: Normal range of motion.  Skin:    General: Skin is warm and dry.  Neurological:     General: No focal deficit present.     Mental Status: He is alert and oriented to person, place, and time. Mental status is at baseline.  Psychiatric:        Mood and Affect: Mood normal.        Behavior: Behavior normal.        Thought Content: Thought content normal.        Judgment: Judgment normal.    Lab Results  Component Value Date   HGBA1C 5.6 12/22/2022   HGBA1C 5.8 09/15/2022   HGBA1C 5.4 03/19/2022    Lab Results  Component Value Date   CREATININE 1.11 03/28/2023   CREATININE 0.77 12/22/2022   CREATININE 1.13 09/15/2022    Lab Results  Component Value Date   WBC 7.3 03/28/2023   HGB 13.7 03/28/2023   HCT 42.0 03/28/2023   PLT 165.0 03/28/2023   GLUCOSE 85 03/28/2023   CHOL 121 03/28/2023   TRIG 108.0 03/28/2023   HDL 43.30 03/28/2023   LDLDIRECT 54.0 09/15/2022   LDLCALC 57 03/28/2023   ALT 20 03/28/2023   AST 18 03/28/2023   NA 141 03/28/2023   K 4.0 03/28/2023   CL 107 03/28/2023   CREATININE 1.11 03/28/2023   BUN 20 03/28/2023   CO2 25 03/28/2023   TSH 3.72 03/28/2023   PSA 0.60 03/28/2023   INR 2.5 05/18/2023   HGBA1C 5.6 12/22/2022   MICROALBUR 3.0 (H) 09/15/2022    MR WRIST RIGHT W WO CONTRAST  Result Date: 05/22/2023 CLINICAL DATA:  Status post fall last Thanksgiving. Worsening wrist pain. EXAM: MR OF THE RIGHT WRIST WITHOUT AND WITH CONTRAST TECHNIQUE: Multiplanar multisequence MR imaging of the right wrist was performed both before and after the  administration of intravenous contrast. CONTRAST:  10 mL Vueway, 0 mL wasted COMPARISON:  None Available. FINDINGS: Ligaments: Extrinsic ligaments are intact. Complete tear of the scapholunate ligament. Lunotriquetral ligament is attenuated concerning for a complete tear. Triangular fibrocartilage: Severe degeneration of the TFCC with a tear along the carpal surface. Tendons: Mild tendinosis of the extensor carpi ulnaris tendon with mild tenosynovitis. Large amount of fluid in the extensor pollicis brevis and abductor pollicis longus tendon sheaths with synechiae as can be seen with severe stenosing tenosynovitis. Flexor compartment tendons are intact. Carpal tunnel/median nerve: Flexor retinaculum is intact. Normal carpal tunnel without a mass. Median nerve demonstrates normal signal and caliber. Guyon's canal: Normal Guyon's canal. Normal ulnar nerve. Joint/cartilage: Full-thickness cartilage loss of the radiocarpal joint. Partial-thickness cartilage loss of the scaphotrapeziotrapezoid joint. Severe full-thickness cartilage loss of the first [facility] joint and first MCP joint. Partial-thickness cartilage loss of the distal radioulnar joint. Subchondral marrow edema in the distal pole of the scaphoid. Subchondral reactive marrow changes in the proximal radial aspect of the lunate, proximal capitate, proximal hamate and distal radius. Moderate wrist joint effusion with synovitis. Bones/carpal alignment: No fracture, avascular necrosis, or osseous lesion. Volar tilting of the lunate as can be seen with VISI. No acute fracture or dislocation. Severe marrow edema throughout the triquetrum likely reactive. Other: Muscles are normal. No fluid collection, hematoma, or soft tissue mass. IMPRESSION: 1. Complete tear of the scapholunate ligament. Lunotriquetral ligament is attenuated concerning for a complete tear. Volar tilting of the lunate as can be seen with VISI. 2. Severe degeneration of the TFCC with a tear along the  carpal surface. 3. Mild tendinosis of the extensor carpi ulnaris tendon with mild tenosynovitis. 4. Large amount of fluid in the extensor  pollicis brevis and abductor pollicis longus tendon sheaths with synechiae as can be seen with severe stenosing tenosynovitis. 5. Severe osteoarthritis of the radiocarpal joint. Severe reactive marrow edema throughout the triquetrum. 6. Severe osteoarthritis of the first [facility] joint and first MCP joint. 7. Moderate wrist joint effusion with synovitis. Electronically Signed   By: Elige Ko M.D.   On: 05/22/2023 06:42    Assessment & Plan:  .B12 deficiency -     B12 and Folate Panel  Vitamin D deficiency -     VITAMIN D 25 Hydroxy (Vit-D Deficiency, Fractures)  Essential hypertension Assessment & Plan: Improving control on current regimen of losartan, hctz  And metoprolol. No changes today   Orders: -     Comprehensive metabolic panel  Acquired hypothyroidism Assessment & Plan: Rechecking today given recurrent atrial fibrillation   Orders: -     TSH  Arthritis of right wrist Assessment & Plan: Complicated by complete tears of several ligaments . He is scheduled to see the hand surgeon Gramig.    OSA (obstructive sleep apnea) Assessment & Plan: Untreated due to patient intolerance to prior mask trials. He continues to report morning fatigue.   I have explained that this cannot be remedied until his OSA is treated   Morbid obesity (HCC) Assessment & Plan: Weight loss was achieved with Ozempic which was obtained through  [employer] and has ended   He lost 50 lbs with the medication .pharmacy consult done  he is now taking ozempic again    Other orders -     Metoprolol Succinate ER; TAKE 1 TABLET BY MOUTH DAILY.  Dispense: 90 tablet; Refill: 1 -     Sildenafil Citrate; Take 1 tablet (20 mg total) by mouth 3 (three) times daily.  Dispense: 90 tablet; Refill: 1 -     HYDROcodone-Acetaminophen; Take 1 tablet by mouth every 8 (eight) hours as  needed.  Dispense: 90 tablet; Refill: 0     I provided 30 minutes of face-to-face time during this encounter reviewing patient's last visit with me, patient's  most recent visit with cardiology,  nephrology,  and neurology,  recent surgical and non surgical procedures, previous  labs and imaging studies, counseling on currently addressed issues,  and post visit ordering to diagnostics and therapeutics .   Follow-up: Return in about 3 months (around 09/27/2023) for chronic pain management.   Sherlene Shams, MD

## 2023-06-28 NOTE — Assessment & Plan Note (Addendum)
Rechecking today given recurrent atrial fibrillation

## 2023-06-28 NOTE — Patient Instructions (Signed)
  Dr Amanda Pea is an excellent hand surgeon   Please continue to monitor wt daily and start the lasix dose for overnight weight gain of 2 lbs or weekly weight gain of 5 lbs.

## 2023-06-28 NOTE — Assessment & Plan Note (Signed)
Complicated by complete tears of several ligaments . He is scheduled to see the hand surgeon Johnathan [name].

## 2023-06-28 NOTE — Assessment & Plan Note (Signed)
Untreated due to patient intolerance to prior mask trials. He continues to report morning fatigue.   I have explained that this cannot be remedied until his OSA is treated

## 2023-06-28 NOTE — Assessment & Plan Note (Signed)
Weight loss was achieved with Ozempic which was obtained through  [employer] and has ended   He lost 50 lbs with the medication .pharmacy consult done  he is now taking ozempic again

## 2023-06-29 ENCOUNTER — Ambulatory Visit: Payer: PPO | Attending: Cardiovascular Disease

## 2023-06-29 DIAGNOSIS — Z86718 Personal history of other venous thrombosis and embolism: Secondary | ICD-10-CM

## 2023-06-29 DIAGNOSIS — I4819 Other persistent atrial fibrillation: Secondary | ICD-10-CM | POA: Diagnosis not present

## 2023-06-29 DIAGNOSIS — Z5181 Encounter for therapeutic drug level monitoring: Secondary | ICD-10-CM

## 2023-06-29 LAB — POCT INR: INR: 3.8 — AB (ref 2.0–3.0)

## 2023-06-29 NOTE — Patient Instructions (Signed)
Hold today only then Continue taking Warfarin 1 tablet daily except  1.5 tablets on MONDAYS AND FRIDAYS.  INR in 4 weeks

## 2023-07-11 ENCOUNTER — Other Ambulatory Visit: Payer: Self-pay | Admitting: Cardiovascular Disease

## 2023-07-11 DIAGNOSIS — I4819 Other persistent atrial fibrillation: Secondary | ICD-10-CM

## 2023-07-12 ENCOUNTER — Telehealth: Payer: Self-pay | Admitting: Cardiovascular Disease

## 2023-07-12 NOTE — Telephone Encounter (Signed)
Pt c/o medication issue:  1. Name of Medication:           amiodarone (PACERONE) 200 MG tablet     warfarin (COUMADIN) 5 MG tablet    2. How are you currently taking this medication (dosage and times per day)?  Take 1 tablet by mouth twice daily     TAKE 1 TO 1 & 1/2 (ONE & ONE-HALF) TABLETS BY MOUTH ONCE DAILY AS DIRECTED BY ANTI-COAG CLINIC      3. Are you having a reaction (difficulty breathing--STAT)? No   4. What is your medication issue? Vernona Rieger called from [facility] pharmacy requesting a callback regarding wanting to make sure MD is aware of the drug interaction between the 2 medications. Please advise

## 2023-07-12 NOTE — Telephone Encounter (Signed)
Yes, we are prescribing both medications and monitoring them. He has been on both medications for years.

## 2023-07-12 NOTE — Telephone Encounter (Signed)
Pharmacy made aware and verbalized understanding.

## 2023-07-27 ENCOUNTER — Other Ambulatory Visit: Payer: Self-pay | Admitting: Cardiovascular Disease

## 2023-07-27 ENCOUNTER — Ambulatory Visit: Payer: PPO | Attending: Cardiovascular Disease

## 2023-07-27 DIAGNOSIS — Z5181 Encounter for therapeutic drug level monitoring: Secondary | ICD-10-CM

## 2023-07-27 DIAGNOSIS — I4819 Other persistent atrial fibrillation: Secondary | ICD-10-CM | POA: Diagnosis not present

## 2023-07-27 DIAGNOSIS — Z86718 Personal history of other venous thrombosis and embolism: Secondary | ICD-10-CM

## 2023-07-27 LAB — POCT INR: INR: 3.8 — AB (ref 2.0–3.0)

## 2023-07-27 NOTE — Patient Instructions (Signed)
Hold today and THURSDAY only then Continue taking Warfarin 1 tablet daily except  1.5 tablets on MONDAYS AND FRIDAYS. Eat more greens while on Prednisone. INR in 3 weeks

## 2023-07-29 ENCOUNTER — Telehealth: Payer: Self-pay | Admitting: Internal Medicine

## 2023-07-29 ENCOUNTER — Other Ambulatory Visit: Payer: Self-pay | Admitting: Internal Medicine

## 2023-07-29 MED ORDER — HYDROCODONE-ACETAMINOPHEN 10-325 MG PO TABS: 1.0000 | ORAL_TABLET | Freq: Three times a day (TID) | ORAL | 0 refills | Status: DC | PRN

## 2023-07-29 NOTE — Addendum Note (Signed)
Addended by: Sherlene Shams on: 07/29/2023 04:56 PM   Modules accepted: Orders

## 2023-07-29 NOTE — Telephone Encounter (Signed)
Patient called and is needing a med refill on HYDROcodone-acetaminophen (NORCO) 10-325 MG tablet   LOV 03/18/2023

## 2023-07-29 NOTE — Telephone Encounter (Signed)
Meds rfilled

## 2023-07-29 NOTE — Telephone Encounter (Signed)
Requesting: Hydrocodone Contract: No UDS: No Last Visit: 06/28/2023 Next Visit: 09/27/2023 Last Refill: 06/28/2023  Please Advise

## 2023-08-16 ENCOUNTER — Telehealth: Payer: Self-pay | Admitting: Cardiovascular Disease

## 2023-08-16 NOTE — Telephone Encounter (Signed)
   Pre-operative Risk Assessment    Patient Name: Johnathan [name]  DOB: May 05, 1956 MRN: [id number]      Request for Surgical Clearance    Procedure:   right carpal tunnel release  Date of Surgery:  Clearance TBD                                 Surgeon:  Dr Dominica Severin Surgeon's Group or Practice Name:  [facility] Phone number:  [phone] Fax number:  [phone]   Type of Clearance Requested:   - Medical    Type of Anesthesia:  Not Indicated   Additional requests/questions:  Please advise surgeon/provider what medications should be held.  Courtney Heys   08/16/2023, 9:56 AM

## 2023-08-17 ENCOUNTER — Ambulatory Visit: Payer: PPO

## 2023-08-17 NOTE — Telephone Encounter (Signed)
Pharmacy please advise on holding warfarin prior to right carpal tunnel release scheduled for TBD. Thank you.

## 2023-08-17 NOTE — Telephone Encounter (Signed)
   Patient Name: Johnathan [name]  DOB: 14-Nov-1955 MRN: [id number]  Primary Cardiologist: None  Clinical pharmacists have reviewed the patient's past medical history, labs, and current medications as part of preoperative protocol coverage. The following recommendations have been made:   Per office protocol, patient can hold warfarin for 5 days prior to procedure.     Patient will not need bridging with Lovenox (enoxaparin) around procedure.   I will route this recommendation to the requesting party via Epic fax function and remove from pre-op pool.  Please call with questions.  Napoleon Form, Leodis Rains, NP 08/17/2023, 1:16 PM

## 2023-08-17 NOTE — Telephone Encounter (Signed)
Patient with diagnosis of A Fib on warfarin for anticoagulation. Remote history of DVT.  Procedure:  right carpal tunnel release  Date of procedure: TBD   CHA2DS2-VASc Score = 3  This indicates a 3.2% annual risk of stroke. The patient's score is based upon: CHF History: 1 HTN History: 1 Diabetes History: 0 Stroke History: 0 Vascular Disease History: 0 Age Score: 1 Gender Score: 0   CrCl 156 ml/min Platelet count 165K   Per office protocol, patient can hold warfarin for 5 days prior to procedure.    Patient will not need bridging with Lovenox (enoxaparin) around procedure.  **This guidance is not considered finalized until pre-operative APP has relayed final recommendations.**

## 2023-08-24 ENCOUNTER — Encounter: Payer: Self-pay | Admitting: Family Medicine

## 2023-08-24 ENCOUNTER — Ambulatory Visit: Payer: PPO | Attending: Cardiovascular Disease

## 2023-08-24 DIAGNOSIS — Z86718 Personal history of other venous thrombosis and embolism: Secondary | ICD-10-CM

## 2023-08-24 DIAGNOSIS — Z5181 Encounter for therapeutic drug level monitoring: Secondary | ICD-10-CM | POA: Diagnosis not present

## 2023-08-24 DIAGNOSIS — I4819 Other persistent atrial fibrillation: Secondary | ICD-10-CM | POA: Diagnosis not present

## 2023-08-24 LAB — POCT INR: INR: 3 (ref 2.0–3.0)

## 2023-08-24 NOTE — Patient Instructions (Signed)
Continue taking Warfarin 1 tablet daily except  1.5 tablets on MONDAYS AND FRIDAYS. INR in 5 weeks

## 2023-08-31 ENCOUNTER — Other Ambulatory Visit: Payer: Self-pay | Admitting: Internal Medicine

## 2023-08-31 NOTE — Telephone Encounter (Signed)
Medication was discontinued on 06/28/2023. Is it okay to refuse refill request?

## 2023-09-13 ENCOUNTER — Other Ambulatory Visit: Payer: Self-pay | Admitting: Internal Medicine

## 2023-09-19 ENCOUNTER — Telehealth: Payer: Self-pay | Admitting: Cardiovascular Disease

## 2023-09-19 ENCOUNTER — Telehealth: Payer: Self-pay | Admitting: Internal Medicine

## 2023-09-19 NOTE — Telephone Encounter (Signed)
See 11/05 clearance encounter.  Patient's wife is follow up. She states [facility] never received clearance recommendation for right carpal tunnel release procedure.

## 2023-09-19 NOTE — Telephone Encounter (Signed)
err

## 2023-09-22 ENCOUNTER — Other Ambulatory Visit: Payer: Self-pay | Admitting: Cardiovascular Disease

## 2023-09-22 DIAGNOSIS — I4819 Other persistent atrial fibrillation: Secondary | ICD-10-CM

## 2023-09-27 ENCOUNTER — Encounter: Payer: Self-pay | Admitting: Internal Medicine

## 2023-09-27 ENCOUNTER — Ambulatory Visit: Payer: PPO | Admitting: Internal Medicine

## 2023-09-27 VITALS — BP 138/88 | HR 112 | Ht 75.0 in | Wt 366.4 lb

## 2023-09-27 DIAGNOSIS — G894 Chronic pain syndrome: Secondary | ICD-10-CM | POA: Diagnosis not present

## 2023-09-27 DIAGNOSIS — Z01818 Encounter for other preprocedural examination: Secondary | ICD-10-CM | POA: Diagnosis not present

## 2023-09-27 DIAGNOSIS — M778 Other enthesopathies, not elsewhere classified: Secondary | ICD-10-CM | POA: Diagnosis not present

## 2023-09-27 MED ORDER — HYDROCODONE-ACETAMINOPHEN 10-325 MG PO TABS: 1.0000 | ORAL_TABLET | Freq: Three times a day (TID) | ORAL | 0 refills | Status: DC | PRN

## 2023-09-27 MED ORDER — METOPROLOL SUCCINATE ER 50 MG PO TB24: ORAL_TABLET | ORAL | 1 refills | Status: DC

## 2023-09-27 MED ORDER — GABAPENTIN 300 MG PO CAPS: ORAL_CAPSULE | ORAL | 3 refills | Status: DC

## 2023-09-27 NOTE — Progress Notes (Signed)
Subjective:  Patient ID: Johnathan [name], male    DOB: 1956/08/27  Age: 67 y.o. MRN: [id number]  CC: The primary encounter diagnosis was Preoperative evaluation to rule out surgical contraindication. Diagnoses of Right wrist tendinitis and Chronic pain disorder were also pertinent to this visit.   HPI Johnathan [name] presents for  Chief Complaint  Patient presents with   Pre-op Exam    Carpal tunnel surgery   Medical Management of Chronic Issues    3 month follow up    1) Chronic low back pain,  knee pain managed with hydrocodone taken every 8 hours   2) recurrent falls:    3) right hand weakness: He  was referred to hand surgeon by sports medicine. He was advised to have surgery  but surery has been delayed until February because he has not had   medical  clearance for right carpal tunnel release planned by Dr  Amanda Pea, [facility]. Patient has lost musc;e mass  and function of  right hand .  He takes coumadin for embolic stroke risk reduction and will need to suspend this 5 days prior to surgery .  He recalls that he had no lovenox bridge during his 5 day  coumadin suspension in March 2024 for right shoulder surgery.  He does have a history of DVT following bariatric surgery 25 years ago     Outpatient Medications Prior to Visit  Medication Sig Dispense Refill   amiodarone (PACERONE) 200 MG tablet Take 1 tablet by mouth twice daily 180 tablet 0   B Complex Vitamins (B COMPLEX 50 PO) Take 1 tablet by mouth daily.      buPROPion (WELLBUTRIN XL) 300 MG 24 hr tablet Take 1 tablet (300 mg total) by mouth daily. 90 tablet 3   Cholecalciferol (VITAMIN D3) 2000 UNITS TABS Take 2,000 Units by mouth daily.     docusate sodium (COLACE) 100 MG capsule Take 400 mg by mouth daily.      Ferrous Gluconate 324 (37.5 Fe) MG TABS Take 1 tablet by mouth daily.      furosemide (LASIX) 40 MG tablet Take 1 tablet (40 mg total) by mouth daily as needed. 90 tablet 0   GLUCOSAMINE HCL-MSM PO Take 1  tablet by mouth daily.     hydrochlorothiazide (HYDRODIURIL) 25 MG tablet Take 1 tablet by mouth once daily 90 tablet 3   levothyroxine (SYNTHROID) 25 MCG tablet TAKE 1 TABLET BY MOUTH ONCE DAILY BEFORE BREAKFAST 90 tablet 1   losartan (COZAAR) 100 MG tablet Take 1 tablet (100 mg total) by mouth daily. 90 tablet 3   Multiple Vitamin (MULTIVITAMIN) tablet Take 1 tablet by mouth daily.     Potassium Chloride ER 20 MEQ TBCR Take 20 mEq by mouth 2 (two) times daily as needed. 180 tablet 3   propranolol (INDERAL) 20 MG tablet Take 1 tablet (20 mg total) by mouth 3 (three) times daily as needed. Reported on 03/22/2016 270 tablet 3   rosuvastatin (CRESTOR) 10 MG tablet Take 1 tablet (10 mg total) by mouth daily at 2 PM. 90 tablet 3   sildenafil (REVATIO) 20 MG tablet Take 1 tablet (20 mg total) by mouth 3 (three) times daily. 90 tablet 1   tadalafil (CIALIS) 20 MG tablet Take 1 tablet (20 mg total) by mouth daily as needed for erectile dysfunction. 30 tablet 1   tiZANidine (ZANAFLEX) 4 MG tablet Take 1 tablet (4 mg total) by mouth every 8 (eight) hours as needed for  muscle spasms. 30 tablet 0   venlafaxine XR (EFFEXOR-XR) 150 MG 24 hr capsule Take 1 capsule (150 mg total) by mouth daily with breakfast. 90 capsule 3   vitamin B-12 (CYANOCOBALAMIN) 500 MCG tablet Take 500 mcg by mouth daily.     vitamin E 400 UNIT capsule Take 400 Units by mouth daily.     warfarin (COUMADIN) 5 MG tablet TAKE 1 TO 1 & 1/2 (ONE & ONE-HALF) TABLETS BY MOUTH ONCE DAILY AS DIRECTED BY  ANTI-COAG  CLINIC 110 tablet 0   gabapentin (NEURONTIN) 300 MG capsule TAKE 1 CAPSULE BY MOUTH THREE TIMES A DAY 270 capsule 3   HYDROcodone-acetaminophen (NORCO) 10-325 MG tablet Take 1 tablet by mouth every 8 (eight) hours as needed. 90 tablet 0   HYDROcodone-acetaminophen (NORCO) 10-325 MG tablet Take 1 tablet by mouth every 8 (eight) hours as needed. 90 tablet 0   HYDROcodone-acetaminophen (NORCO) 10-325 MG tablet Take 1 tablet by mouth every  8 (eight) hours as needed. 90 tablet 0   metoprolol succinate (TOPROL-XL) 50 MG 24 hr tablet TAKE 1 TABLET BY MOUTH DAILY. 90 tablet 1   No facility-administered medications prior to visit.    Review of Systems;  Patient denies headache, fevers, malaise, unintentional weight loss, skin rash, eye pain, sinus congestion and sinus pain, sore throat, dysphagia,  hemoptysis , cough, dyspnea, wheezing, chest pain, palpitations, orthopnea, edema, abdominal pain, nausea, melena, diarrhea, constipation, flank pain, dysuria, hematuria, urinary  Frequency, nocturia, numbness, tingling, seizures,  Focal weakness, Loss of consciousness,  Tremor, insomnia, depression, anxiety, and suicidal ideation.      Objective:  BP 138/88   Pulse (!) 112   Ht 6\' 3"  (1.905 m)   Wt (!) 366 lb 6.4 oz (166.2 kg)   SpO2 96%   BMI 45.80 kg/m   BP Readings from Last 3 Encounters:  09/27/23 138/88  06/28/23 134/88  05/02/23 108/78    Wt Readings from Last 3 Encounters:  09/27/23 (!) 366 lb 6.4 oz (166.2 kg)  06/28/23 (!) 369 lb 9.6 oz (167.6 kg)  05/02/23 (!) 365 lb 12.8 oz (165.9 kg)    Physical Exam Vitals reviewed.  Constitutional:      General: He is not in acute distress.    Appearance: Normal appearance. He is obese. He is not ill-appearing, toxic-appearing or diaphoretic.  HENT:     Head: Normocephalic.  Eyes:     General: No scleral icterus.       Right eye: No discharge.        Left eye: No discharge.     Conjunctiva/sclera: Conjunctivae normal.  Cardiovascular:     Rate and Rhythm: Normal rate. Rhythm irregular.     Heart sounds: Normal heart sounds.  Pulmonary:     Effort: Pulmonary effort is normal. No respiratory distress.     Breath sounds: Normal breath sounds.  Musculoskeletal:        General: Normal range of motion.     Right hand: Decreased strength.     Left hand: Normal.     Cervical back: Normal range of motion.     Comments: Muscle atrophy of right hand intrinsic uscles    Skin:    General: Skin is warm and dry.  Neurological:     General: No focal deficit present.     Mental Status: He is alert and oriented to person, place, and time. Mental status is at baseline.     Motor: Weakness present.  Psychiatric:  Mood and Affect: Mood normal.        Behavior: Behavior normal.        Thought Content: Thought content normal.        Judgment: Judgment normal.    Lab Results  Component Value Date   HGBA1C 5.6 12/22/2022   HGBA1C 5.8 09/15/2022   HGBA1C 5.4 03/19/2022    Lab Results  Component Value Date   CREATININE 1.07 06/28/2023   CREATININE 1.11 03/28/2023   CREATININE 0.77 12/22/2022    Lab Results  Component Value Date   WBC 7.3 03/28/2023   HGB 13.7 03/28/2023   HCT 42.0 03/28/2023   PLT 165.0 03/28/2023   GLUCOSE 95 06/28/2023   CHOL 121 03/28/2023   TRIG 108.0 03/28/2023   HDL 43.30 03/28/2023   LDLDIRECT 54.0 09/15/2022   LDLCALC 57 03/28/2023   ALT 28 06/28/2023   AST 22 06/28/2023   NA 141 06/28/2023   K 4.4 06/28/2023   CL 106 06/28/2023   CREATININE 1.07 06/28/2023   BUN 17 06/28/2023   CO2 27 06/28/2023   TSH 4.53 06/28/2023   PSA 0.60 03/28/2023   INR 3.0 08/24/2023   HGBA1C 5.6 12/22/2022   MICROALBUR 3.0 (H) 09/15/2022    MR WRIST RIGHT W WO CONTRAST Result Date: 05/22/2023 CLINICAL DATA:  Status post fall last Thanksgiving. Worsening wrist pain. EXAM: MR OF THE RIGHT WRIST WITHOUT AND WITH CONTRAST TECHNIQUE: Multiplanar multisequence MR imaging of the right wrist was performed both before and after the administration of intravenous contrast. CONTRAST:  10 mL Vueway, 0 mL wasted COMPARISON:  None Available. FINDINGS: Ligaments: Extrinsic ligaments are intact. Complete tear of the scapholunate ligament. Lunotriquetral ligament is attenuated concerning for a complete tear. Triangular fibrocartilage: Severe degeneration of the TFCC with a tear along the carpal surface. Tendons: Mild tendinosis of the extensor carpi  ulnaris tendon with mild tenosynovitis. Large amount of fluid in the extensor pollicis brevis and abductor pollicis longus tendon sheaths with synechiae as can be seen with severe stenosing tenosynovitis. Flexor compartment tendons are intact. Carpal tunnel/median nerve: Flexor retinaculum is intact. Normal carpal tunnel without a mass. Median nerve demonstrates normal signal and caliber. Guyon's canal: Normal Guyon's canal. Normal ulnar nerve. Joint/cartilage: Full-thickness cartilage loss of the radiocarpal joint. Partial-thickness cartilage loss of the scaphotrapeziotrapezoid joint. Severe full-thickness cartilage loss of the first [facility] joint and first MCP joint. Partial-thickness cartilage loss of the distal radioulnar joint. Subchondral marrow edema in the distal pole of the scaphoid. Subchondral reactive marrow changes in the proximal radial aspect of the lunate, proximal capitate, proximal hamate and distal radius. Moderate wrist joint effusion with synovitis. Bones/carpal alignment: No fracture, avascular necrosis, or osseous lesion. Volar tilting of the lunate as can be seen with VISI. No acute fracture or dislocation. Severe marrow edema throughout the triquetrum likely reactive. Other: Muscles are normal. No fluid collection, hematoma, or soft tissue mass. IMPRESSION: 1. Complete tear of the scapholunate ligament. Lunotriquetral ligament is attenuated concerning for a complete tear. Volar tilting of the lunate as can be seen with VISI. 2. Severe degeneration of the TFCC with a tear along the carpal surface. 3. Mild tendinosis of the extensor carpi ulnaris tendon with mild tenosynovitis. 4. Large amount of fluid in the extensor pollicis brevis and abductor pollicis longus tendon sheaths with synechiae as can be seen with severe stenosing tenosynovitis. 5. Severe osteoarthritis of the radiocarpal joint. Severe reactive marrow edema throughout the triquetrum. 6. Severe osteoarthritis of the first [facility] joint  and  first MCP joint. 7. Moderate wrist joint effusion with synovitis. Electronically Signed   By: Elige Ko M.D.   On: 05/22/2023 06:42    Assessment & Plan:  .Preoperative evaluation to rule out surgical contraindication Assessment & Plan: MEDICAL CLEARANCE GIVEN TODAY  CARDIOLOGY IS MANAGING COUMADIN  SUSPENSION   Right wrist tendinitis Assessment & Plan: MRI findings of  right wrist   1. Complete tear of the scapholunate ligament. Lunotriquetral ligament is attenuated concerning for a complete tear. Volar tilting of the lunate as can be seen with VISI. 2. Severe degeneration of the TFCC with a tear along the carpal surface. 3. Mild tendinosis of the extensor carpi ulnaris tendon with mild tenosynovitis. 4. Large amount of fluid in the extensor pollicis brevis and abductor pollicis longus tendon sheaths with synechiae as can be seen with severe stenosing tenosynovitis. 5. Severe osteoarthritis of the radiocarpal joint. Severe reactive marrow edema throughout the triquetrum. 6. Severe osteoarthritis of the first [facility] joint and first MCP joint. 7. Moderate wrist joint effusion with synoviti s   Chronic pain disorder Assessment & Plan: Patient's  chronic pain is managed with hydrocodone every 8 hours  qty #90/month.  Patient submitted an early refill request following  shoulder surgery  despite  filling an  rx for oxycodone from the orthopedist.  Early refill was denied and patient was advised that failure to notify me of an alternative rx from another physician is considered  a violation of contract.  Resume hydrocodone regular schedule Refill history confirmed via [place] Controlled Substance databas, accessed by me today..    Other orders -     Metoprolol Succinate ER; TAKE 1 TABLET BY MOUTH DAILY.  Dispense: 90 tablet; Refill: 1 -     Gabapentin; TAKE 1 CAPSULE BY MOUTH THREE TIMES A DAY  Dispense: 270 capsule; Refill: 3 -     HYDROcodone-Acetaminophen; Take 1 tablet by mouth every 8 (eight)  hours as needed.  Dispense: 90 tablet; Refill: 0 -     HYDROcodone-Acetaminophen; Take 1 tablet by mouth every 8 (eight) hours as needed.  Dispense: 90 tablet; Refill: 0 -     HYDROcodone-Acetaminophen; Take 1 tablet by mouth every 8 (eight) hours as needed.  Dispense: 90 tablet; Refill: 0     I provided 30 minutes of face-to-face time during this encounter reviewing patient's last visit with me, patient's  most recent visit with cardiology,  nephrology,  and neurology,  recent surgical and non surgical procedures, previous  labs and imaging studies, counseling on currently addressed issues,  and post visit ordering to diagnostics and therapeutics .   Follow-up: No follow-ups on file.   Sherlene Shams, MD

## 2023-09-27 NOTE — Assessment & Plan Note (Signed)
MRI findings of  right wrist   1. Complete tear of the scapholunate ligament. Lunotriquetral ligament is attenuated concerning for a complete tear. Volar tilting of the lunate as can be seen with VISI. 2. Severe degeneration of the TFCC with a tear along the carpal surface. 3. Mild tendinosis of the extensor carpi ulnaris tendon with mild tenosynovitis. 4. Large amount of fluid in the extensor pollicis brevis and abductor pollicis longus tendon sheaths with synechiae as can be seen with severe stenosing tenosynovitis. 5. Severe osteoarthritis of the radiocarpal joint. Severe reactive marrow edema throughout the triquetrum. 6. Severe osteoarthritis of the first [facility] joint and first MCP joint. 7. Moderate wrist joint effusion with synoviti s

## 2023-09-27 NOTE — Assessment & Plan Note (Signed)
Patient's  chronic pain is managed with hydrocodone every 8 hours  qty #90/month.  Patient submitted an early refill request following  shoulder surgery  despite  filling an  rx for oxycodone from the orthopedist.  Early refill was denied and patient was advised that failure to notify me of an alternative rx from another physician is considered  a violation of contract.  Resume hydrocodone regular schedule Refill history confirmed via [place] Controlled Substance databas, accessed by me today.Marland Kitchen

## 2023-09-27 NOTE — Assessment & Plan Note (Signed)
MEDICAL CLEARANCE GIVEN TODAY  CARDIOLOGY IS MANAGING COUMADIN  SUSPENSION

## 2023-09-28 ENCOUNTER — Ambulatory Visit: Payer: PPO | Attending: Cardiovascular Disease

## 2023-09-28 DIAGNOSIS — I4819 Other persistent atrial fibrillation: Secondary | ICD-10-CM

## 2023-09-28 DIAGNOSIS — Z5181 Encounter for therapeutic drug level monitoring: Secondary | ICD-10-CM | POA: Diagnosis not present

## 2023-09-28 DIAGNOSIS — Z86718 Personal history of other venous thrombosis and embolism: Secondary | ICD-10-CM

## 2023-09-28 LAB — POCT INR: INR: 2.9 (ref 2.0–3.0)

## 2023-09-28 NOTE — Patient Instructions (Signed)
Continue taking Warfarin 1 tablet daily except  1.5 tablets on MONDAYS AND FRIDAYS. INR in 6 weeks.  Rt Carpal Tunnel Surgery 11/15/23

## 2023-10-26 ENCOUNTER — Telehealth: Payer: Self-pay | Admitting: Cardiovascular Disease

## 2023-10-26 ENCOUNTER — Other Ambulatory Visit: Payer: Self-pay | Admitting: Cardiovascular Disease

## 2023-10-26 NOTE — Telephone Encounter (Signed)
 Left voicemail. Pt needs appt for med refill, scheduled from recall

## 2023-10-26 NOTE — Telephone Encounter (Signed)
Requested refill sent to preferred pharmacy

## 2023-10-26 NOTE — Telephone Encounter (Signed)
 Left voice mail

## 2023-10-26 NOTE — Telephone Encounter (Signed)
 Patient called and scheduled office visit on 1/21.

## 2023-10-26 NOTE — Telephone Encounter (Signed)
 Good Morning,  Could you schedule this patient a 6 month follow up visit? The patient was last seen by Dr. Gollan on 05-02-2023. Thank you so much.

## 2023-10-27 NOTE — Telephone Encounter (Signed)
Pt is scheduled on 12/1.

## 2023-11-01 ENCOUNTER — Ambulatory Visit: Payer: PPO | Attending: Cardiology | Admitting: Cardiology

## 2023-11-01 ENCOUNTER — Encounter: Payer: Self-pay | Admitting: Cardiology

## 2023-11-01 VITALS — BP 136/82 | HR 86 | Ht 75.0 in | Wt 369.8 lb

## 2023-11-01 DIAGNOSIS — I5022 Chronic systolic (congestive) heart failure: Secondary | ICD-10-CM | POA: Diagnosis not present

## 2023-11-01 DIAGNOSIS — M2550 Pain in unspecified joint: Secondary | ICD-10-CM

## 2023-11-01 DIAGNOSIS — I10 Essential (primary) hypertension: Secondary | ICD-10-CM | POA: Diagnosis not present

## 2023-11-01 DIAGNOSIS — I4819 Other persistent atrial fibrillation: Secondary | ICD-10-CM

## 2023-11-01 DIAGNOSIS — Z01810 Encounter for preprocedural cardiovascular examination: Secondary | ICD-10-CM

## 2023-11-01 DIAGNOSIS — I420 Dilated cardiomyopathy: Secondary | ICD-10-CM | POA: Diagnosis not present

## 2023-11-01 DIAGNOSIS — G8929 Other chronic pain: Secondary | ICD-10-CM

## 2023-11-01 NOTE — Progress Notes (Signed)
Cardiology Office Note:  .   Date:  11/01/2023  ID:  Johnathan [name], DOB 07/24/1956, MRN [id number] PCP: Sherlene Shams, MD  [facility] Providers Cardiologist:  Johnathan Nordmann, MD    History of Present Illness: .   Johnathan [name] is a 68 y.o. male with a past medical history of persistent atrial fibrillation (05/2012), morbid obesity, HTN, history of DVT (RLE, 2004), OSA not on CPAP, chronic joint pain, chronic HFrEF/dilated cardiomyopathy, hypothyroidism, chronic venous insufficiency, who is here today for follow up.   Previously been diagnosed with atrial fibrillation in August 2013 normal ejection fraction at that time.  Echocardiogram in 2016 revealed an LVEF of 40-45%.  He suffers from chronic joint pain of the shoulders and knees and on long-term low-dose of narcotics.  Is undergoing bariatric surgery but unfortunately continues to suffer from morbid obesity.  He does have a past medical history of a DVT to the right lower extremity from 2004.  He had prior cardioversion that was successful in restoring sinus rhythm.  He had a tick bite in June 2016 when he developed atrial fibrillation initially with RVR.  He was started on digoxin, continued on metoprolol, and diltiazem with improved heart rate.  Echocardiogram and stress were done at that time.  Ejection fraction estimated at 40-45% stress test of small region of an apical perfusion defect.   He was last seen in clinic 05/02/2023 by Dr. Mariah Milling.  At that time he was a little bit short of breath, had right shoulder pain on exertion that goes away with sitting down.  Leg swelling has been stable and he was not taking his furosemide.  He was noted to be back in atrial fibrillation and was recommended to continue with amiodarone, metoprolol, warfarin.  Discussed that he would need 4 weeks of therapeutic INRs before he can undergo cardioversion procedure.  No other medication changes were made and no further testing was ordered.  He  returns to clinic today and said that overall he has been doing well.  He denies any chest pain, palpitations, dyspnea, lightheadedness, syncope or near syncope.  Continues to have chronic shortness of breath that is unchanged and occasional palpitations.  Swelling to his bilateral lower extremities have remained stable and he states that he is not taking a dose of his as needed furosemide approximately a year.  States that he fell last year and injured his shoulder and his wrist and now is requiring surgery to his right wrist.  He continues to have significant amount of pain and numbness to that extremity.  States that he has been compliant with his current medication regimen.  Has remained on warfarin and has not noted any bleeding or blood noted in his urine or stool.  He denies any hospitalizations or visits to the emergency department.  ROS: 10 point review of system has been reviewed and considered negative with exception was been listed in the HPI  Studies Reviewed: Marland Kitchen   EKG Interpretation Date/Time:  Tuesday November 01 2023 14:56:12 EST Ventricular Rate:  86 PR Interval:    QRS Duration:  110 QT Interval:  428 QTC Calculation: 512 R Axis:   -9  Text Interpretation: Atrial fibrillation with premature ventricular or aberrantly conducted complexes Incomplete right bundle branch block When compared with ECG of 02-May-2023 08:49, Confirmed by Charlsie Quest ([id number]) on 11/01/2023 3:02:25 PM    Event Monitor (Zio) 10/18/2022 Normal sinus rhythm Patient had a min HR of 44 bpm, max HR of  113 bpm, and avg HR of 57 bpm.  First Degree AV Block was present.    Isolated SVEs were rare (<1.0%), SVE Triplets were rare (<1.0%), and no SVE Couplets were present.   Isolated VEs were rare (<1.0%, 1673), VE Couplets were rare (<1.0%, 4), and VE Triplets were rare (<1.0%, 2).  Ventricular Bigeminy was present. Inverted QRS complexes possibly due to inverted placement of device.   Patient triggered events  (13) associated with normal sinus rhythm  Lexiscan Myoview 04/19/2015 There was no ST segment deviation noted during stress. Defect 1: There is a small defect of moderate severity present in the apex location. This is suggestive of apical ischemia. Findings consistent with ischemia. This is an intermediate risk study. The left ventricular ejection fraction is moderately decreased (30-44%). Calculated EF was 21% but visually appears to be 35-40%.  2D echo 04/18/2015 Study Conclusions  - Left ventricle: The cavity size was normal. There was mild    concentric hypertrophy. Systolic function was mildly to    moderately reduced. The estimated ejection fraction was in the    range of 40% to 45%. Images were inadequate for LV wall motion    assessment.  - Mitral valve: There was mild regurgitation.  - Left atrium: The atrium was severely dilated.  - Pulmonary arteries: Systolic pressure was within the normal    range.  Risk Assessment/Calculations:    CHA2DS2-VASc Score = 3   This indicates a 3.2% annual risk of stroke. The patient's score is based upon: CHF History: 1 HTN History: 1 Diabetes History: 0 Stroke History: 0 Vascular Disease History: 0 Age Score: 1 Gender Score: 0            Physical Exam:   VS:  BP 136/82 (BP Location: Left Arm, Patient Position: Sitting, Cuff Size: Large)   Pulse 86   Ht 6\' 3"  (1.905 m)   Wt (!) 369 lb 12.8 oz (167.7 kg)   SpO2 95%   BMI 46.22 kg/m    Wt Readings from Last 3 Encounters:  11/01/23 (!) 369 lb 12.8 oz (167.7 kg)  09/27/23 (!) 366 lb 6.4 oz (166.2 kg)  06/28/23 (!) 369 lb 9.6 oz (167.6 kg)    GEN: Well nourished, well developed in no acute distress NECK: Unable to determine JVD due to body habitus; No carotid bruits CARDIAC: IR IR, no murmurs, rubs, gallops RESPIRATORY:  Clear to auscultation without rales, wheezing or rhonchi  ABDOMEN: Soft, non-tender, obese, non-distended EXTREMITIES: Trace pretibial edema; No deformity    ASSESSMENT AND PLAN: .   Persistent atrial fibrillation where he is found to be in A-fib today with EKG revealed atrial fibrillation that is rate controlled with a rate of 86 with PVC versus aberrancy, left axis deviation incomplete right bundle branch block.  No acute changes noted from prior studies.  He has been continued on amiodarone 200 mg twice daily, Toprol-XL 50 mg daily and warfarin where he is followed by Coumadin clinic for CHA2DS2-VASc score of at least 3 for stroke prophylaxis.  Primary hypertension with a blood pressure 135/90 repeated 136/82.  States blood pressures have been well-controlled.  He has continued HCTZ 25 mg daily, Toprol-XL 50 mg daily.  He is encouraged to continue to monitor his blood pressures 1 to 2 hours postmedication administration at home as well.  Chronic HFmrEF/dilated cardiomyopathy with last LVEF 40-45% on last echocardiogram.  He has chronic shortness of breath that has unchanged over the last several years.  Appears to be  euvolemic on exam.  Peripheral edema has been stable and he has not required the use of his as needed furosemide for over the past year.  Continues to self sneakers at the while at water aerobics several times a week.  No repeat testing needed at this time.  Severe obesity where he is unable to exercise secondary to gait instability/debility.  She would greatly benefit from weight loss.  Chronic joint pain chronic pain medication continues to be managed by his PCP.  Preoperative cardiovascular examination recorded ACC/AHA guidelines no further cardiovascular testing is needed.  The patient may proceed with surgery as a small risk.  Please see pharmacy clearance for recommendations on Coumadin.    Mr. Padberg perioperative risk of a major cardiac event is 0.9% according to the Revised Cardiac Risk Index (RCRI).  Therefore, he is at high risk for perioperative complications.   His functional capacity is poor at 5.84 METs according to the  [facility] Activity Status Index (DASI). Recommendations: According to ACC/AHA guidelines, no further cardiovascular testing needed.  The patient may proceed to surgery at acceptable risk.         Dispo: Patient return to clinic to see MD/APP in 6 months or sooner if needed  Signed, Burdett Pinzon, NP

## 2023-11-01 NOTE — Patient Instructions (Signed)
Medication Instructions:  The current medical regimen is effective;  continue present plan and medications.  *If you need a refill on your cardiac medications before your next appointment, please call your pharmacy*   Follow-Up: At [facility], you and your health needs are our priority.  As part of our continuing mission to provide you with exceptional heart care, we have created designated Provider Care Teams.  These Care Teams include your primary Cardiologist (physician) and Advanced Practice Providers (APPs -  Physician Assistants and Nurse Practitioners) who all work together to provide you with the care you need, when you need it.  We recommend signing up for the patient portal called "MyChart".  Sign up information is provided on this After Visit Summary.  MyChart is used to connect with patients for Virtual Visits (Telemedicine).  Patients are able to view lab/test results, encounter notes, upcoming appointments, etc.  Non-urgent messages can be sent to your provider as well.   To learn more about what you can do with MyChart, go to [email or web address].    Your next appointment:   6 month(s)  Provider:   Julien Nordmann, MD

## 2023-11-09 ENCOUNTER — Ambulatory Visit: Payer: PPO | Attending: Cardiovascular Disease

## 2023-11-09 DIAGNOSIS — Z86718 Personal history of other venous thrombosis and embolism: Secondary | ICD-10-CM | POA: Diagnosis not present

## 2023-11-09 DIAGNOSIS — I4819 Other persistent atrial fibrillation: Secondary | ICD-10-CM | POA: Diagnosis not present

## 2023-11-09 DIAGNOSIS — Z5181 Encounter for therapeutic drug level monitoring: Secondary | ICD-10-CM | POA: Diagnosis not present

## 2023-11-09 LAB — POCT INR: INR: 3.4 — AB (ref 2.0–3.0)

## 2023-11-09 NOTE — Patient Instructions (Signed)
Rt Carpal Tunnel Surgery 2/4; Holding 1/30-2/3 then Continue taking Warfarin 1 tablet daily except  1.5 tablets on MONDAYS AND FRIDAYS. INR in 2 weeks.

## 2023-11-15 DIAGNOSIS — M65941 Unspecified synovitis and tenosynovitis, right hand: Secondary | ICD-10-CM | POA: Diagnosis not present

## 2023-11-15 DIAGNOSIS — G5621 Lesion of ulnar nerve, right upper limb: Secondary | ICD-10-CM | POA: Diagnosis not present

## 2023-11-15 DIAGNOSIS — M67431 Ganglion, right wrist: Secondary | ICD-10-CM | POA: Diagnosis not present

## 2023-11-15 DIAGNOSIS — G5601 Carpal tunnel syndrome, right upper limb: Secondary | ICD-10-CM | POA: Diagnosis not present

## 2023-11-15 DIAGNOSIS — M65831 Other synovitis and tenosynovitis, right forearm: Secondary | ICD-10-CM | POA: Diagnosis not present

## 2023-11-23 ENCOUNTER — Ambulatory Visit: Payer: HMO | Attending: Cardiovascular Disease

## 2023-11-23 DIAGNOSIS — Z5181 Encounter for therapeutic drug level monitoring: Secondary | ICD-10-CM | POA: Diagnosis not present

## 2023-11-23 DIAGNOSIS — I4819 Other persistent atrial fibrillation: Secondary | ICD-10-CM

## 2023-11-23 DIAGNOSIS — Z86718 Personal history of other venous thrombosis and embolism: Secondary | ICD-10-CM | POA: Diagnosis not present

## 2023-11-23 LAB — POCT INR: INR: 1.7 — AB (ref 2.0–3.0)

## 2023-11-23 NOTE — Patient Instructions (Signed)
Take 1.5 tablets today only then Continue taking Warfarin 1 tablet daily except  1.5 tablets on MONDAYS AND FRIDAYS. INR in 4 weeks.

## 2023-12-01 NOTE — Telephone Encounter (Signed)
 This encounter was created in error - please disregard.

## 2023-12-16 ENCOUNTER — Encounter: Payer: Self-pay | Admitting: Internal Medicine

## 2023-12-16 MED ORDER — LOSARTAN POTASSIUM 100 MG PO TABS: 100.0000 mg | ORAL_TABLET | Freq: Every day | ORAL | 3 refills | Status: DC

## 2023-12-21 ENCOUNTER — Ambulatory Visit: Payer: HMO | Attending: Cardiovascular Disease

## 2023-12-26 ENCOUNTER — Ambulatory Visit (INDEPENDENT_AMBULATORY_CARE_PROVIDER_SITE_OTHER): Payer: PPO | Admitting: Internal Medicine

## 2023-12-26 ENCOUNTER — Encounter: Payer: Self-pay | Admitting: Internal Medicine

## 2023-12-26 VITALS — BP 128/82 | HR 43 | Temp 98.7°F | Ht 63.0 in | Wt 367.8 lb

## 2023-12-26 DIAGNOSIS — D6869 Other thrombophilia: Secondary | ICD-10-CM

## 2023-12-26 DIAGNOSIS — R296 Repeated falls: Secondary | ICD-10-CM | POA: Diagnosis not present

## 2023-12-26 DIAGNOSIS — I10 Essential (primary) hypertension: Secondary | ICD-10-CM

## 2023-12-26 DIAGNOSIS — G894 Chronic pain syndrome: Secondary | ICD-10-CM | POA: Diagnosis not present

## 2023-12-26 DIAGNOSIS — M21371 Foot drop, right foot: Secondary | ICD-10-CM | POA: Diagnosis not present

## 2023-12-26 DIAGNOSIS — F322 Major depressive disorder, single episode, severe without psychotic features: Secondary | ICD-10-CM | POA: Diagnosis not present

## 2023-12-26 DIAGNOSIS — G8929 Other chronic pain: Secondary | ICD-10-CM

## 2023-12-26 DIAGNOSIS — M48061 Spinal stenosis, lumbar region without neurogenic claudication: Secondary | ICD-10-CM

## 2023-12-26 DIAGNOSIS — M778 Other enthesopathies, not elsewhere classified: Secondary | ICD-10-CM

## 2023-12-26 DIAGNOSIS — M545 Low back pain, unspecified: Secondary | ICD-10-CM

## 2023-12-26 DIAGNOSIS — E7849 Other hyperlipidemia: Secondary | ICD-10-CM

## 2023-12-26 MED ORDER — HYDROCODONE-ACETAMINOPHEN 10-325 MG PO TABS: 1.0000 | ORAL_TABLET | Freq: Three times a day (TID) | ORAL | 0 refills | Status: DC | PRN

## 2023-12-26 MED ORDER — VENLAFAXINE HCL ER 150 MG PO CP24: 150.0000 mg | ORAL_CAPSULE | Freq: Every day | ORAL | 3 refills | Status: DC

## 2023-12-26 MED ORDER — GABAPENTIN 300 MG PO CAPS: ORAL_CAPSULE | ORAL | 3 refills | Status: DC

## 2023-12-26 MED ORDER — BUPROPION HCL ER (XL) 300 MG PO TB24: 300.0000 mg | ORAL_TABLET | Freq: Every day | ORAL | 3 refills | Status: AC

## 2023-12-26 MED ORDER — PROPRANOLOL HCL 20 MG PO TABS: 20.0000 mg | ORAL_TABLET | Freq: Three times a day (TID) | ORAL | 3 refills | Status: DC | PRN

## 2023-12-26 MED ORDER — HYDROCHLOROTHIAZIDE 25 MG PO TABS: 25.0000 mg | ORAL_TABLET | Freq: Every day | ORAL | 3 refills | Status: DC

## 2023-12-26 NOTE — Progress Notes (Unsigned)
 Subjective:  Patient ID: Johnathan [name], male    DOB: 1956-04-17  Age: 68 y.o. MRN: [id number]  CC: The primary encounter diagnosis was Essential hypertension. Diagnoses of Other hyperlipidemia, Chronic midline low back pain, unspecified whether sciatica present, Acquired right foot drop, Frequent falls, Current severe episode of major depressive disorder without psychotic features, unspecified whether recurrent (HCC), Right wrist tendinitis, Acquired thrombophilia (HCC), and Chronic pain disorder were also pertinent to this visit.   HPI Johnathan [name] presents for  Chief Complaint  Patient presents with   Medical Management of Chronic Issues    3 month follow up    CHRONIC pAIN:  MULTIPLE joints (knees,  wrist,  shoulder,  lowerw back) Refill history confirmed via [place] Controlled Substance databas, accessed by me today.Marland Kitchen   FREQUENT FALLS:   he states that his legs getting weaker , and he is using a cane. He has not had any falls over the winter,  but he falls frequently when he is outside due to instability.  He has not had MRI or EMG/[place] studies of lumbar spine or LE's .  Still driving ,  but right foot does not clear the floor .  Refill history confirmed via [place] Controlled Substance databas, accessed by me today..   Right wrist still problematic:  had CTR,  then ganglionic cyst removed  Feb 2025 by [facility]        Outpatient Medications Prior to Visit  Medication Sig Dispense Refill   amiodarone (PACERONE) 200 MG tablet Take 1 tablet by mouth twice daily 180 tablet 0   B Complex Vitamins (B COMPLEX 50 PO) Take 1 tablet by mouth daily.      Cholecalciferol (VITAMIN D3) 2000 UNITS TABS Take 2,000 Units by mouth daily.     docusate sodium (COLACE) 100 MG capsule Take 400 mg by mouth daily.      Ferrous Gluconate 324 (37.5 Fe) MG TABS Take 1 tablet by mouth daily.      furosemide (LASIX) 40 MG tablet Take 1 tablet (40 mg total) by mouth daily as needed. 90 tablet 0   GLUCOSAMINE HCL-MSM  PO Take 1 tablet by mouth daily.     levothyroxine (SYNTHROID) 25 MCG tablet TAKE 1 TABLET BY MOUTH ONCE DAILY BEFORE BREAKFAST 90 tablet 1   losartan (COZAAR) 100 MG tablet Take 1 tablet (100 mg total) by mouth daily. 90 tablet 3   metoprolol succinate (TOPROL-XL) 50 MG 24 hr tablet TAKE 1 TABLET BY MOUTH DAILY. 90 tablet 1   Multiple Vitamin (MULTIVITAMIN) tablet Take 1 tablet by mouth daily.     Potassium Chloride ER 20 MEQ TBCR Take 20 mEq by mouth 2 (two) times daily as needed. 180 tablet 3   rosuvastatin (CRESTOR) 10 MG tablet Take 1 tablet (10 mg total) by mouth daily at 2 PM. 90 tablet 3   sildenafil (REVATIO) 20 MG tablet Take 1 tablet (20 mg total) by mouth 3 (three) times daily. 90 tablet 1   tadalafil (CIALIS) 20 MG tablet Take 1 tablet (20 mg total) by mouth daily as needed for erectile dysfunction. 30 tablet 1   tiZANidine (ZANAFLEX) 4 MG tablet Take 1 tablet (4 mg total) by mouth every 8 (eight) hours as needed for muscle spasms. 30 tablet 0   vitamin B-12 (CYANOCOBALAMIN) 500 MCG tablet Take 500 mcg by mouth daily.     vitamin E 400 UNIT capsule Take 400 Units by mouth daily.     warfarin (COUMADIN) 5 MG  tablet TAKE 1 TO 1 & 1/2 (ONE & ONE-HALF) TABLETS BY MOUTH ONCE DAILY AS DIRECTED BY  ANTI-COAG  CLINIC 110 tablet 0   buPROPion (WELLBUTRIN XL) 300 MG 24 hr tablet Take 1 tablet (300 mg total) by mouth daily. 90 tablet 3   gabapentin (NEURONTIN) 300 MG capsule TAKE 1 CAPSULE BY MOUTH THREE TIMES A DAY 270 capsule 3   hydrochlorothiazide (HYDRODIURIL) 25 MG tablet Take 1 tablet by mouth once daily 90 tablet 3   HYDROcodone-acetaminophen (NORCO) 10-325 MG tablet Take 1 tablet by mouth every 8 (eight) hours as needed. 90 tablet 0   HYDROcodone-acetaminophen (NORCO) 10-325 MG tablet Take 1 tablet by mouth every 8 (eight) hours as needed. 90 tablet 0   HYDROcodone-acetaminophen (NORCO) 10-325 MG tablet Take 1 tablet by mouth every 8 (eight) hours as needed. 90 tablet 0   propranolol  (INDERAL) 20 MG tablet Take 1 tablet (20 mg total) by mouth 3 (three) times daily as needed. Reported on 03/22/2016 270 tablet 3   venlafaxine XR (EFFEXOR-XR) 150 MG 24 hr capsule Take 1 capsule (150 mg total) by mouth daily with breakfast. 90 capsule 3   No facility-administered medications prior to visit.    Review of Systems;  Patient denies headache, fevers, malaise, unintentional weight loss, skin rash, eye pain, sinus congestion and sinus pain, sore throat, dysphagia,  hemoptysis , cough, dyspnea, wheezing, chest pain, palpitations, orthopnea, edema, abdominal pain, nausea, melena, diarrhea, constipation, flank pain, dysuria, hematuria, urinary  Frequency, nocturia, numbness, tingling, seizures,  Focal weakness, Loss of consciousness,  Tremor, insomnia, depression, anxiety, and suicidal ideation.      Objective:  BP 128/82   Pulse (!) 43   Temp 98.7 F (37.1 C) (Oral)   Ht 5\' 3"  (1.6 m)   Wt (!) 367 lb 12.8 oz (166.8 kg)   SpO2 97%   BMI 65.15 kg/m   BP Readings from Last 3 Encounters:  12/26/23 128/82  11/01/23 136/82  09/27/23 138/88    Wt Readings from Last 3 Encounters:  12/26/23 (!) 367 lb 12.8 oz (166.8 kg)  11/01/23 (!) 369 lb 12.8 oz (167.7 kg)  09/27/23 (!) 366 lb 6.4 oz (166.2 kg)    Physical Exam Vitals reviewed.  Constitutional:      General: He is not in acute distress.    Appearance: Normal appearance. He is obese. He is not ill-appearing, toxic-appearing or diaphoretic.  HENT:     Head: Normocephalic.  Eyes:     General: No scleral icterus.       Right eye: No discharge.        Left eye: No discharge.     Conjunctiva/sclera: Conjunctivae normal.  Cardiovascular:     Rate and Rhythm: Normal rate and regular rhythm.     Heart sounds: Normal heart sounds.  Pulmonary:     Effort: Pulmonary effort is normal. No respiratory distress.     Breath sounds: Normal breath sounds.  Musculoskeletal:     Right wrist: Swelling, tenderness and snuff box  tenderness present.     Cervical back: Normal range of motion.     Right foot: Decreased range of motion. Foot drop present.     Comments: Right wrist in ace bandage  decreased ROM   Skin:    General: Skin is warm and dry.  Neurological:     General: No focal deficit present.     Mental Status: He is alert and oriented to person, place, and time. Mental status is at  baseline.  Psychiatric:        Mood and Affect: Mood normal.        Behavior: Behavior normal.        Thought Content: Thought content normal.        Judgment: Judgment normal.    Lab Results  Component Value Date   HGBA1C 5.6 12/22/2022   HGBA1C 5.8 09/15/2022   HGBA1C 5.4 03/19/2022    Lab Results  Component Value Date   CREATININE 1.07 06/28/2023   CREATININE 1.11 03/28/2023   CREATININE 0.77 12/22/2022    Lab Results  Component Value Date   WBC 7.3 03/28/2023   HGB 13.7 03/28/2023   HCT 42.0 03/28/2023   PLT 165.0 03/28/2023   GLUCOSE 95 06/28/2023   CHOL 121 03/28/2023   TRIG 108.0 03/28/2023   HDL 43.30 03/28/2023   LDLDIRECT 54.0 09/15/2022   LDLCALC 57 03/28/2023   ALT 28 06/28/2023   AST 22 06/28/2023   NA 141 06/28/2023   K 4.4 06/28/2023   CL 106 06/28/2023   CREATININE 1.07 06/28/2023   BUN 17 06/28/2023   CO2 27 06/28/2023   TSH 4.53 06/28/2023   PSA 0.60 03/28/2023   INR 1.7 (A) 11/23/2023   HGBA1C 5.6 12/22/2022   MICROALBUR 3.0 (H) 09/15/2022    MR WRIST RIGHT W WO CONTRAST Result Date: 05/22/2023 CLINICAL DATA:  Status post fall last Thanksgiving. Worsening wrist pain. EXAM: MR OF THE RIGHT WRIST WITHOUT AND WITH CONTRAST TECHNIQUE: Multiplanar multisequence MR imaging of the right wrist was performed both before and after the administration of intravenous contrast. CONTRAST:  10 mL Vueway, 0 mL wasted COMPARISON:  None Available. FINDINGS: Ligaments: Extrinsic ligaments are intact. Complete tear of the scapholunate ligament. Lunotriquetral ligament is attenuated concerning for a  complete tear. Triangular fibrocartilage: Severe degeneration of the TFCC with a tear along the carpal surface. Tendons: Mild tendinosis of the extensor carpi ulnaris tendon with mild tenosynovitis. Large amount of fluid in the extensor pollicis brevis and abductor pollicis longus tendon sheaths with synechiae as can be seen with severe stenosing tenosynovitis. Flexor compartment tendons are intact. Carpal tunnel/median nerve: Flexor retinaculum is intact. Normal carpal tunnel without a mass. Median nerve demonstrates normal signal and caliber. Guyon's canal: Normal Guyon's canal. Normal ulnar nerve. Joint/cartilage: Full-thickness cartilage loss of the radiocarpal joint. Partial-thickness cartilage loss of the scaphotrapeziotrapezoid joint. Severe full-thickness cartilage loss of the first [facility] joint and first MCP joint. Partial-thickness cartilage loss of the distal radioulnar joint. Subchondral marrow edema in the distal pole of the scaphoid. Subchondral reactive marrow changes in the proximal radial aspect of the lunate, proximal capitate, proximal hamate and distal radius. Moderate wrist joint effusion with synovitis. Bones/carpal alignment: No fracture, avascular necrosis, or osseous lesion. Volar tilting of the lunate as can be seen with VISI. No acute fracture or dislocation. Severe marrow edema throughout the triquetrum likely reactive. Other: Muscles are normal. No fluid collection, hematoma, or soft tissue mass. IMPRESSION: 1. Complete tear of the scapholunate ligament. Lunotriquetral ligament is attenuated concerning for a complete tear. Volar tilting of the lunate as can be seen with VISI. 2. Severe degeneration of the TFCC with a tear along the carpal surface. 3. Mild tendinosis of the extensor carpi ulnaris tendon with mild tenosynovitis. 4. Large amount of fluid in the extensor pollicis brevis and abductor pollicis longus tendon sheaths with synechiae as can be seen with severe stenosing  tenosynovitis. 5. Severe osteoarthritis of the radiocarpal joint. Severe reactive marrow edema throughout  the triquetrum. 6. Severe osteoarthritis of the first [facility] joint and first MCP joint. 7. Moderate wrist joint effusion with synovitis. Electronically Signed   By: Elige Ko M.D.   On: 05/22/2023 06:42    Assessment & Plan:  .Essential hypertension Assessment & Plan: Improved control on current regimen of losartan, hctz  And metoprolol. No changes today   Orders: -     Comprehensive metabolic panel; Future -     Microalbumin / creatinine urine ratio; Future  Other hyperlipidemia Assessment & Plan: Tolerating crestor , LDL is < 55   Lab Results  Component Value Date   CHOL 121 03/28/2023   HDL 43.30 03/28/2023   LDLCALC 57 03/28/2023   LDLDIRECT 54.0 09/15/2022   TRIG 108.0 03/28/2023   CHOLHDL 3 03/28/2023    Orders: -     Comprehensive metabolic panel; Future -     LDL cholesterol, direct; Future -     Hemoglobin A1c; Future -     Lipid panel; Future  Chronic midline low back pain, unspecified whether sciatica present -     MR LUMBAR SPINE WO CONTRAST; Future  Acquired right foot drop -     MR LUMBAR SPINE WO CONTRAST; Future  Frequent falls Assessment & Plan: Secondary to leg weakness and right foot drip.  Repeated attempts to refer to neurology for EMG testing have been thus far  unsuccessful. 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  Screening for reversible causes of neuropathy have been done.  Referring to PT for balance and strength training    Current severe episode of major depressive disorder without psychotic features, unspecified whether recurrent [facility]) Assessment & Plan: He is on maximal doses of wellbutrin and Effexor but declines psychiatry referral . He is not suicidal and feels that the change in weather will alleviate his symptoms    Right wrist tendinitis Assessment & Plan: Now s/p ganglionic cyst removal and CTR   bu [facility] in early February    Acquired thrombophilia [facility]) Assessment & Plan: Managed with warfarin  for embolic stroke risk mitigation due to  atrial fibrillation. Patient has no signs of bleeding  in spite of multiple falls and is advised to notify his specialists prior to any procedure that may required suspension of warfarin . He has  regular follow up at the INR clinic and recent INR is elevated  he has been advised to contact his cardiologist to discuss a dose adjustment   Lab Results  Component Value Date   WBC 7.3 03/28/2023   HGB 13.7 03/28/2023   HCT 42.0 03/28/2023   MCV 91.0 03/28/2023   PLT 165.0 03/28/2023      Chronic pain disorder Assessment & Plan: Patient's  chronic pain is managed with hydrocodone every 8 hours  qty #90/month.  NSAIDS are contrindicated due to chornic anticoagulation with warfarin. Refill history confirmed via [place] Controlled Substance databas, accessed by me today.. refills for March, April  May sent to pharmacy    Other orders -     buPROPion HCl ER (XL); Take 1 tablet (300 mg total) by mouth daily.  Dispense: 90 tablet; Refill: 3 -     Gabapentin; TAKE 1 CAPSULE BY MOUTH THREE TIMES A DAY  Dispense: 270 capsule; Refill: 3 -     hydroCHLOROthiazide; Take 1 tablet (25 mg total) by mouth daily.  Dispense: 90 tablet; Refill: 3 -     Venlafaxine HCl ER; Take 1 capsule (150 mg  total) by mouth daily with breakfast.  Dispense: 90 capsule; Refill: 3 -     Propranolol HCl; Take 1 tablet (20 mg total) by mouth 3 (three) times daily as needed. Reported on 03/22/2016  Dispense: 270 tablet; Refill: 3 -     HYDROcodone-Acetaminophen; Take 1 tablet by mouth every 8 (eight) hours as needed.  Dispense: 90 tablet; Refill: 0 -     HYDROcodone-Acetaminophen; Take 1 tablet by mouth every 8 (eight) hours as needed.  Dispense: 90 tablet; Refill: 0 -     HYDROcodone-Acetaminophen; Take 1 tablet by mouth every 8 (eight) hours as needed.  Dispense: 90 tablet; Refill:  0     I spent 34 minutes on the day of this face to face encounter reviewing patient's  most recent visit with cardiology,  orthopedics,  sports medicine , prior relevant surgical and non surgical procedures, recent  labs and imaging studies, counseling on weight management,  reviewing the assessment and plan with patient, and post visit ordering and reviewing of  diagnostics and therapeutics with patient  .   Follow-up: Return for chronic pain management.   Sherlene Shams, MD

## 2023-12-26 NOTE — Patient Instructions (Signed)
 I have refilled your propranolol  your hydrocodone , and ordered an MRI of the lumbar spine to evaluate your foot drop

## 2023-12-27 NOTE — Assessment & Plan Note (Addendum)
 Patient's  chronic pain is managed with hydrocodone every 8 hours  qty #90/month.  NSAIDS are contrindicated due to chornic anticoagulation with warfarin. Refill history confirmed via [place] Controlled Substance databas, accessed by me today.. refills for March, April  May sent to pharmacy

## 2023-12-27 NOTE — Assessment & Plan Note (Signed)
 He is on maximal doses of wellbutrin and Effexor but declines psychiatry referral . He is not suicidal and feels that the change in weather will alleviate his symptoms

## 2023-12-27 NOTE — Assessment & Plan Note (Signed)
 Tolerating crestor , LDL is < 55   Lab Results  Component Value Date   CHOL 121 03/28/2023   HDL 43.30 03/28/2023   LDLCALC 57 03/28/2023   LDLDIRECT 54.0 09/15/2022   TRIG 108.0 03/28/2023   CHOLHDL 3 03/28/2023

## 2023-12-27 NOTE — Assessment & Plan Note (Signed)
 Improved control on current regimen of losartan, hctz  And metoprolol. No changes today

## 2023-12-27 NOTE — Assessment & Plan Note (Signed)
 Now s/p ganglionic cyst removal and CTR  bu [facility] in early February

## 2023-12-27 NOTE — Assessment & Plan Note (Signed)
 Managed with warfarin  for embolic stroke risk mitigation due to  atrial fibrillation. Patient has no signs of bleeding  in spite of multiple falls and is advised to notify his specialists prior to any procedure that may required suspension of warfarin . He has  regular follow up at the INR clinic and recent INR is elevated  he has been advised to contact his cardiologist to discuss a dose adjustment   Lab Results  Component Value Date   WBC 7.3 03/28/2023   HGB 13.7 03/28/2023   HCT 42.0 03/28/2023   MCV 91.0 03/28/2023   PLT 165.0 03/28/2023

## 2023-12-27 NOTE — Assessment & Plan Note (Signed)
 Secondary to leg weakness and right foot drip.  Repeated attempts to refer to neurology for EMG testing have been thus far  unsuccessful. 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  Screening for reversible causes of neuropathy have been done.  Referring to PT for balance and strength training

## 2023-12-28 ENCOUNTER — Telehealth: Payer: Self-pay | Admitting: Internal Medicine

## 2023-12-28 NOTE — Telephone Encounter (Signed)
 Lft pt vm to call ofc to sch MRI. thanks

## 2023-12-29 ENCOUNTER — Telehealth: Payer: Self-pay | Admitting: Internal Medicine

## 2023-12-29 NOTE — Telephone Encounter (Signed)
 Lft pt vm to call ofc to sch MRI. thanks

## 2023-12-30 ENCOUNTER — Telehealth: Payer: Self-pay | Admitting: Internal Medicine

## 2023-12-30 NOTE — Telephone Encounter (Signed)
 Lft pt vm to call ofc to sch MRI. thanks

## 2024-01-03 ENCOUNTER — Ambulatory Visit
Admission: RE | Admit: 2024-01-03 | Discharge: 2024-01-03 | Disposition: A | Discharge status: Home / Self Care | Source: Ambulatory Visit | Attending: Internal Medicine | Admitting: Internal Medicine

## 2024-01-03 DIAGNOSIS — G8929 Other chronic pain: Secondary | ICD-10-CM | POA: Diagnosis not present

## 2024-01-03 DIAGNOSIS — M545 Low back pain, unspecified: Secondary | ICD-10-CM | POA: Diagnosis not present

## 2024-01-03 DIAGNOSIS — M4805 Spinal stenosis, thoracolumbar region: Secondary | ICD-10-CM | POA: Diagnosis not present

## 2024-01-03 DIAGNOSIS — M21371 Foot drop, right foot: Secondary | ICD-10-CM | POA: Insufficient documentation

## 2024-01-03 DIAGNOSIS — M5136 Other intervertebral disc degeneration, lumbar region with discogenic back pain only: Secondary | ICD-10-CM | POA: Diagnosis not present

## 2024-01-03 DIAGNOSIS — M48061 Spinal stenosis, lumbar region without neurogenic claudication: Secondary | ICD-10-CM | POA: Diagnosis not present

## 2024-01-23 ENCOUNTER — Telehealth: Payer: Self-pay

## 2024-01-23 NOTE — Telephone Encounter (Signed)
 Lpmtcb to schedule INR check in [place]

## 2024-01-27 ENCOUNTER — Other Ambulatory Visit: Payer: Self-pay | Admitting: Cardiovascular Disease

## 2024-01-28 ENCOUNTER — Encounter: Payer: Self-pay | Admitting: Internal Medicine

## 2024-01-28 NOTE — Addendum Note (Signed)
 Addended by: Thersia Flax on: 01/28/2024 10:32 PM   Modules accepted: Orders

## 2024-01-31 NOTE — Progress Notes (Unsigned)
 Referring Physician:  Thersia Flax, MD [address] [place],  [place] [address]  Primary Physician:  Thersia Flax, MD  History of Present Illness: 02/02/2024 Johnathan [name] is here today with a chief complaint of bilateral foot drop.  This has been ongoing for years, and slowly worsening over time.  He has known neuropathy.    He does have back pain, but his foot drop and imbalance are the bigggest issue for him.  Bowel/Bladder Dysfunction: none  Conservative measures:  Physical therapy:  has not participated in PT Multimodal medical therapy including regular antiinflammatories:  Gabapentin , Hydrocodone  Injections: no epidural steroid injections  Past Surgery: none  Johnathan [name] has no symptoms of cervical myelopathy.  The symptoms are causing a significant impact on the patient's life.   I have utilized the care everywhere function in epic to review the outside records available from external health systems.  Review of Systems:  A 10 point review of systems is negative, except for the pertinent positives and negatives detailed in the HPI.  Past Medical History: Past Medical History:  Diagnosis Date   Arthritis    a. chronic joint pain   B12 deficiency    Chronic systolic CHF (congestive heart failure) (HCC)    a. echo 2013: EF of 50-55%, normal right ventricular systolic pressure, normal left atrium; b. EF 40-45%, inadequate for LV wall motion, mild MR, LA severely dilated @ 52 mm, PASP nl   Complication of anesthesia    Dysrhythmia    Erythema migrans (Lyme disease) 04/08/2015   History of DVT (deep vein thrombosis) 2004   s/p bariatric surgery   History of stress test    a. there was no ST segment deviation noted during stress,    There is a small defect of moderate severity present in the apex location, suggestive of apical ischemia, intermediate risk, calculated EF 21% but visually appears to be 35-40%   Hypertension     Hypothyroidism    Neuropathy    Obesity    OSA (obstructive sleep apnea)    a. not compliant with CPAP   PAF (paroxysmal atrial fibrillation) (HCC)    a. CHADSVASc at least 2 (HTN and vascular disease); b. on Eliquis     Pre-diabetes     Past Surgical History: Past Surgical History:  Procedure Laterality Date   ARTHROSCOPIC REPAIR ACL Bilateral 1993   bilateral knee x2 on left and x2 on right   BARIATRIC SURGERY  2004   Rou en Y    BARIATRIC SURGERY     CHOLECYSTECTOMY  1982   ELECTROPHYSIOLOGIC STUDY N/A 06/20/2015   Procedure: CARDIOVERSION;  Surgeon: Devorah Fonder, MD;  Location: [facility] OR[facility];  Service: Cardiovascular;  Laterality: N/A;   GANGLION CYST EXCISION     KNEE ARTHROPLASTY Right 06/20/2017   Procedure: COMPUTER ASSISTED TOTAL KNEE ARTHROPLASTY;  Surgeon: Arlyne Lame, MD;  Location: [facility] OR[facility];  Service: Orthopedics;  Laterality: Right;   MENISCUS DEBRIDEMENT Left 2011   Left knee   REPLACEMENT TOTAL KNEE Left 07/2013   REVERSE SHOULDER ARTHROPLASTY Right 12/30/2022   Procedure: REVERSE SHOULDER ARTHROPLASTY;  Surgeon: Sammye Cristal, MD;  Location: [facility];  Service: Orthopedics;  Laterality: Right;   TONSILLECTOMY      Allergies: Allergies as of 02/02/2024 - Review Complete 02/02/2024  Allergen Reaction Noted   Eliquis  [apixaban ] Itching 11/05/2021   Xarelto [rivaroxaban] Hives 11/05/2021    Medications:  Current Outpatient Medications:    amiodarone  (PACERONE )  200 MG tablet, Take 1 tablet by mouth twice daily, Disp: 180 tablet, Rfl: 3   B Complex Vitamins (B COMPLEX 50 PO), Take 1 tablet by mouth daily. , Disp: , Rfl:    buPROPion  (WELLBUTRIN  XL) 300 MG 24 hr tablet, Take 1 tablet (300 mg total) by mouth daily., Disp: 90 tablet, Rfl: 3   Cholecalciferol  (VITAMIN D3) 2000 UNITS TABS, Take 2,000 Units by mouth daily., Disp: , Rfl:    docusate sodium  (COLACE) 100 MG capsule, Take 400 mg by mouth daily. , Disp: , Rfl:    Ferrous Gluconate  324 (37.5 Fe) MG  TABS, Take 1 tablet by mouth daily. , Disp: , Rfl:    furosemide  (LASIX ) 40 MG tablet, Take 1 tablet (40 mg total) by mouth daily as needed., Disp: 90 tablet, Rfl: 0   gabapentin  (NEURONTIN ) 300 MG capsule, TAKE 1 CAPSULE BY MOUTH THREE TIMES A DAY, Disp: 270 capsule, Rfl: 3   GLUCOSAMINE HCL-MSM PO, Take 1 tablet by mouth daily., Disp: , Rfl:    hydrochlorothiazide  (HYDRODIURIL ) 25 MG tablet, Take 1 tablet (25 mg total) by mouth daily., Disp: 90 tablet, Rfl: 3   HYDROcodone -acetaminophen  (NORCO) 10-325 MG tablet, Take 1 tablet by mouth every 8 (eight) hours as needed., Disp: 90 tablet, Rfl: 0   levothyroxine  (SYNTHROID ) 25 MCG tablet, TAKE 1 TABLET BY MOUTH ONCE DAILY BEFORE BREAKFAST, Disp: 90 tablet, Rfl: 1   losartan  (COZAAR ) 100 MG tablet, Take 1 tablet (100 mg total) by mouth daily., Disp: 90 tablet, Rfl: 3   metoprolol  succinate (TOPROL -XL) 50 MG 24 hr tablet, TAKE 1 TABLET BY MOUTH DAILY., Disp: 90 tablet, Rfl: 1   Multiple Vitamin (MULTIVITAMIN) tablet, Take 1 tablet by mouth daily., Disp: , Rfl:    Potassium Chloride  ER 20 MEQ TBCR, Take 20 mEq by mouth 2 (two) times daily as needed., Disp: 180 tablet, Rfl: 3   propranolol  (INDERAL ) 20 MG tablet, Take 1 tablet (20 mg total) by mouth 3 (three) times daily as needed. Reported on 03/22/2016, Disp: 270 tablet, Rfl: 3   rosuvastatin  (CRESTOR ) 10 MG tablet, Take 1 tablet (10 mg total) by mouth daily at 2 PM., Disp: 90 tablet, Rfl: 3   sildenafil  (REVATIO ) 20 MG tablet, Take 1 tablet (20 mg total) by mouth 3 (three) times daily., Disp: 90 tablet, Rfl: 1   tadalafil  (CIALIS ) 20 MG tablet, Take 1 tablet (20 mg total) by mouth daily as needed for erectile dysfunction., Disp: 30 tablet, Rfl: 1   venlafaxine  XR (EFFEXOR -XR) 150 MG 24 hr capsule, Take 1 capsule (150 mg total) by mouth daily with breakfast., Disp: 90 capsule, Rfl: 3   vitamin B-12 (CYANOCOBALAMIN ) 500 MCG tablet, Take 500 mcg by mouth daily., Disp: , Rfl:    vitamin E  400 UNIT capsule,  Take 400 Units by mouth daily., Disp: , Rfl:    warfarin (COUMADIN ) 5 MG tablet, TAKE 1 TO 1 & 1/2 (ONE & ONE-HALF) TABLETS BY MOUTH ONCE DAILY AS DIRECTED BY  [facility]  [facility], Disp: 110 tablet, Rfl: 0  Social History: Social History   Tobacco Use   Smoking status: Never    Passive exposure: Never   Smokeless tobacco: Never  Vaping Use   Vaping status: Never Used  Substance Use Topics   Alcohol use: Not Currently    Alcohol/week: 2.0 standard drinks of alcohol    Types: 2 Shots of liquor per week    Comment: Vodka and lemonade occasionally 2-3/week    Drug use: Yes  Comment: gummies delta A    Family Medical History: Family History  Problem Relation Age of Onset   Dementia Mother    Diabetes Father    Alzheimer's disease Father     Physical Examination: Vitals:   02/02/24 1451  BP: 114/82    General: Patient is in no apparent distress. Attention to examination is appropriate.  Neck:   Supple.  Full range of motion.  Respiratory: Patient is breathing without any difficulty.   NEUROLOGICAL:     Awake, alert, oriented to person, place, and time.  Speech is clear and fluent.   Cranial Nerves: Pupils equal round and reactive to light.  Facial tone is symmetric.  Facial sensation is symmetric. Shoulder shrug is symmetric. Tongue protrusion is midline.  There is no pronator drift.  Strength: Side Biceps Triceps Deltoid Interossei Grip Wrist Ext. Wrist Flex.  R 5 5 5 5 5 5 5   L 5 5 5 5 5 5 5    Side Iliopsoas Quads Hamstring PF DF EHL  R 5 5 5 4 2 2   L 5 5 5 5 2 2    Reflexes are 1+ and symmetric at the biceps, triceps, brachioradialis, patella and achilles.   Hoffman's is absent.   Bilateral upper and lower extremity sensation is symmetric to light touch.   It is diminished below the knees. No evidence of dysmetria noted.  Gait is abnormal - using walker.     Medical Decision Making  Imaging: MRI L spine 01/03/2024 IMPRESSION: 1. Diffuse lumbar spine  degeneration with bulky disc, endplate, posterior element degeneration. Some superimposed epidural lipomatosis. Isolated mild degenerative L2 vertebral body marrow edema. Widespread 2. Multifactorial spinal stenosis from T12-L1 through L4-L5, frequently Moderate and is Severe at L2-L3. Up to mild mass effect on the lower thoracic spinal cord and the conus but, no associated signal abnormality. 3. Widespread associated moderate and severe lateral recess and neural foraminal stenosis, see details above.     Electronically Signed   By: Marlise Simpers M.D.   On: 01/28/2024 07:56  I have personally reviewed the images and agree with the above interpretation.  Assessment and Plan: Mr. Cooler is a pleasant 68 y.o. male with lumbar stenosis with neurogenic claudication, though the biggest issue for him is balance and gait challenges causing falls due to his foot drop.  I will send him for custom AFOs and PT.  To help work up his foot drop, I have referred him to a neuromuscular specialist.  I will see him back in 8 weeks.  If his neuropathy is causing pain or discomfort over time, we may consider spinal cord stimulator.  I spent a total of 30 minutes in this patient's care today. This time was spent reviewing pertinent records including imaging studies, obtaining and confirming history, performing a directed evaluation, formulating and discussing my recommendations, and documenting the visit within the medical record.      Thank you for involving me in the care of this patient.      France Noyce K. Mont Antis MD, [facility] Neurosurgery

## 2024-02-02 ENCOUNTER — Ambulatory Visit: Admitting: Neurosurgery

## 2024-02-02 ENCOUNTER — Encounter: Payer: Self-pay | Admitting: Neurosurgery

## 2024-02-02 VITALS — BP 114/82 | Ht 75.0 in | Wt 356.0 lb

## 2024-02-02 DIAGNOSIS — M21371 Foot drop, right foot: Secondary | ICD-10-CM | POA: Diagnosis not present

## 2024-02-02 DIAGNOSIS — R2689 Other abnormalities of gait and mobility: Secondary | ICD-10-CM

## 2024-02-02 DIAGNOSIS — M48062 Spinal stenosis, lumbar region with neurogenic claudication: Secondary | ICD-10-CM

## 2024-02-02 DIAGNOSIS — M21372 Foot drop, left foot: Secondary | ICD-10-CM | POA: Diagnosis not present

## 2024-02-03 ENCOUNTER — Other Ambulatory Visit: Payer: Self-pay | Admitting: Family Medicine

## 2024-02-08 ENCOUNTER — Ambulatory Visit: Attending: Cardiovascular Disease

## 2024-02-08 DIAGNOSIS — Z5181 Encounter for therapeutic drug level monitoring: Secondary | ICD-10-CM

## 2024-02-08 DIAGNOSIS — I4819 Other persistent atrial fibrillation: Secondary | ICD-10-CM

## 2024-02-08 DIAGNOSIS — Z86718 Personal history of other venous thrombosis and embolism: Secondary | ICD-10-CM

## 2024-02-08 LAB — POCT INR: INR: 2.9 (ref 2.0–3.0)

## 2024-02-08 NOTE — Patient Instructions (Addendum)
 Continue taking Warfarin 1 tablet daily except  1.5 tablets on MONDAYS AND FRIDAYS. INR in 6 weeks. [phone]

## 2024-02-13 ENCOUNTER — Other Ambulatory Visit: Payer: Self-pay | Admitting: Internal Medicine

## 2024-02-13 DIAGNOSIS — I4819 Other persistent atrial fibrillation: Secondary | ICD-10-CM

## 2024-02-13 NOTE — Telephone Encounter (Signed)
 Last Fill: Warfarin: 09/23/23     Metoprolol : 09/27/23  Last OV: 12/26/23 Next OV: 03/27/24  Routing to provider for review/authorization.

## 2024-02-13 NOTE — Telephone Encounter (Signed)
 Copied from CRM [id number]. Topic: Clinical - Medication Refill >> Feb 13, 2024  3:30 PM Allyne Areola wrote: Most Recent Primary Care Visit:  Provider: Thersia Flax  Department: [facility]  Visit Type: OFFICE VISIT  Date: 12/26/2023  Medication: warfarin (COUMADIN ) 5 MG tablet ,metoprolol  succinate (TOPROL -XL) 50 MG 24 hr tablet   Has the patient contacted their pharmacy? Yes, no refills left (Agent: If no, request that the patient contact the pharmacy for the refill. If patient does not wish to contact the pharmacy document the reason why and proceed with request.) (Agent: If yes, when and what did the pharmacy advise?)  Is this the correct pharmacy for this prescription? Yes If no, delete pharmacy and type the correct one.  This is the patient's preferred pharmacy:  [facility] [address] [place] - [address] [place] [address] Phone: [phone] Fax: [phone]    Has the prescription been filled recently? No  Is the patient out of the medication? No  Has the patient been seen for an appointment in the last year OR does the patient have an upcoming appointment? Yes  Can we respond through MyChart? Yes  Agent: Please be advised that Rx refills may take up to 3 business days. We ask that you follow-up with your pharmacy.

## 2024-02-14 ENCOUNTER — Encounter: Payer: Self-pay | Admitting: Neurology

## 2024-02-14 MED ORDER — METOPROLOL SUCCINATE ER 50 MG PO TB24: ORAL_TABLET | ORAL | 1 refills | Status: DC

## 2024-02-17 ENCOUNTER — Other Ambulatory Visit: Payer: Self-pay | Admitting: Cardiovascular Disease

## 2024-02-17 ENCOUNTER — Telehealth: Payer: Self-pay | Admitting: Cardiovascular Disease

## 2024-02-17 DIAGNOSIS — I4819 Other persistent atrial fibrillation: Secondary | ICD-10-CM

## 2024-02-17 MED ORDER — WARFARIN SODIUM 5 MG PO TABS: ORAL_TABLET | ORAL | 0 refills | Status: DC

## 2024-02-17 NOTE — Telephone Encounter (Signed)
*  STAT* If patient is at the pharmacy, call can be transferred to refill team.   1. Which medications need to be refilled? (please list name of each medication and dose if known)   warfarin (COUMADIN ) 5 MG tablet   2. Would you like to learn more about the convenience, safety, & potential cost savings by using the [facility]?   3. Are you open to using the [facility] (Type [facility]. ).  4. Which pharmacy/location (including street and city if local pharmacy) is medication to be sent to?  [id number] - [place], [place] - [address]   5. Do they need a 30 day or 90 day supply?   Wife Dana Duncan) stated patient is completely out of this medication.

## 2024-02-21 MED ORDER — WARFARIN SODIUM 5 MG PO TABS: ORAL_TABLET | ORAL | 0 refills | Status: DC

## 2024-03-18 ENCOUNTER — Other Ambulatory Visit: Payer: Self-pay | Admitting: Internal Medicine

## 2024-03-19 ENCOUNTER — Other Ambulatory Visit: Payer: Self-pay

## 2024-03-19 ENCOUNTER — Telehealth: Payer: Self-pay | Admitting: Cardiovascular Disease

## 2024-03-19 DIAGNOSIS — I4819 Other persistent atrial fibrillation: Secondary | ICD-10-CM

## 2024-03-19 MED ORDER — WARFARIN SODIUM 5 MG PO TABS: ORAL_TABLET | ORAL | 0 refills | Status: DC

## 2024-03-19 NOTE — Telephone Encounter (Signed)
*  STAT* If patient is at the pharmacy, call can be transferred to refill team.   1. Which medications need to be refilled? (please list name of each medication and dose if known)  warfarin (COUMADIN ) 5 MG tablet  2. Which pharmacy/location (including street and city if local pharmacy) is medication to be sent to? [id number] - [place], [place] - [address]  3. Do they need a 30 day or 90 day supply?   90 day supply  Wife says patient has been out of medication for 4 days.

## 2024-03-21 ENCOUNTER — Ambulatory Visit: Attending: Cardiovascular Disease

## 2024-03-21 DIAGNOSIS — Z5181 Encounter for therapeutic drug level monitoring: Secondary | ICD-10-CM | POA: Diagnosis not present

## 2024-03-21 DIAGNOSIS — Z86718 Personal history of other venous thrombosis and embolism: Secondary | ICD-10-CM | POA: Diagnosis not present

## 2024-03-21 DIAGNOSIS — I4819 Other persistent atrial fibrillation: Secondary | ICD-10-CM | POA: Diagnosis not present

## 2024-03-21 LAB — POCT INR: INR: 1.3 — AB (ref 2.0–3.0)

## 2024-03-21 IMAGING — MR MR CERVICAL SPINE W/O CM
5 of 6 series · 34 of 48 positions shown · non-contrast
Comparison: Cervical spine radiograph 04/03/2021

CLINICAL DATA: Neck and shoulder pain and left arm pain for 3
years.

EXAM:
MRI CERVICAL SPINE WITHOUT CONTRAST
TECHNIQUE: Multiplanar, multisequence MR imaging of the cervical spine was
performed. No intravenous contrast was administered.

[Series 6: T1 · sagittal · 3.0mm · 0.66mm/px · 5 of 17 slices shown]
[im 1/17]
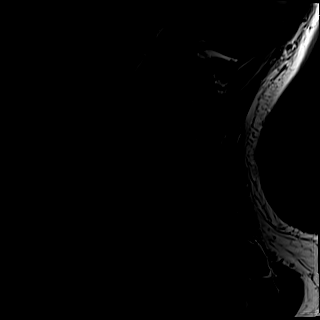
[im 5/17]
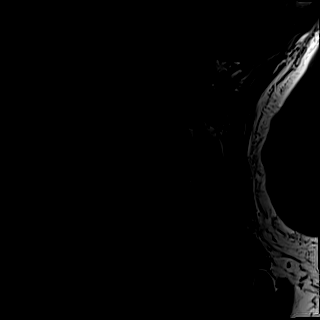
[im 9/17]
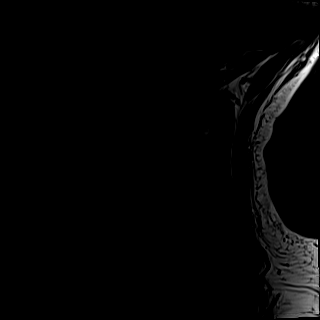
[im 13/17]
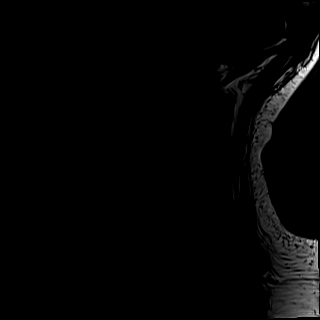
[im 17/17]
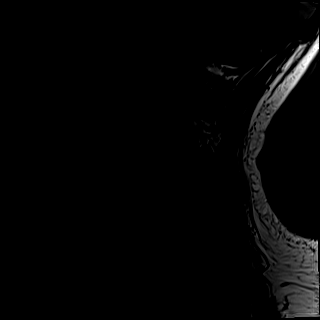

[Series 7: T2 · sagittal · 3.0mm · 0.55mm/px · 5 of 17 slices shown (1 of 3)]
[im 1/17]
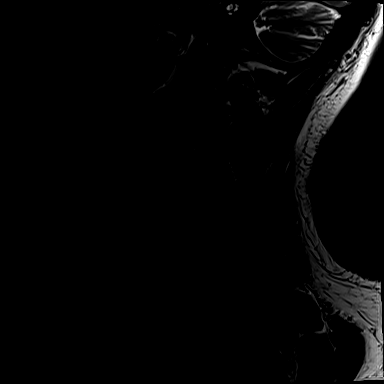
[im 5/17]
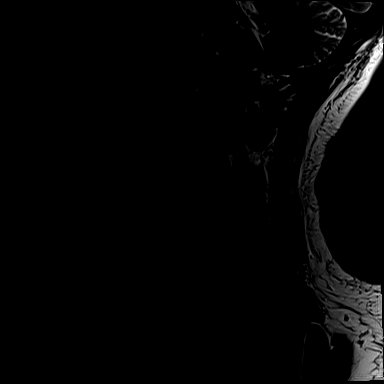
[im 9/17]
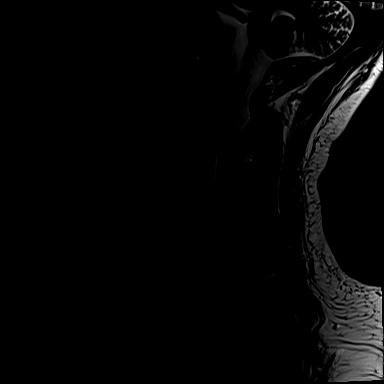
[im 13/17]
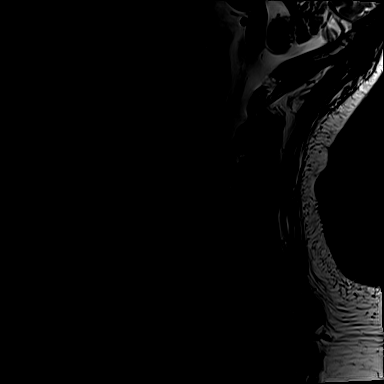
[im 17/17]
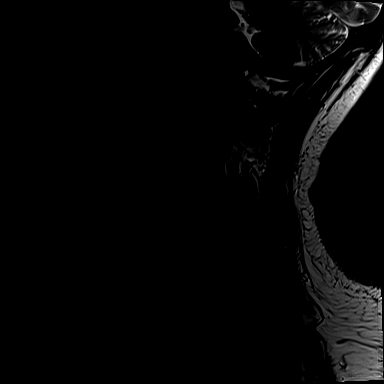

[Series 8: STIR · sagittal · 3.0mm · 0.33mm/px · 2 of 17 slices shown]
[im 1/17]
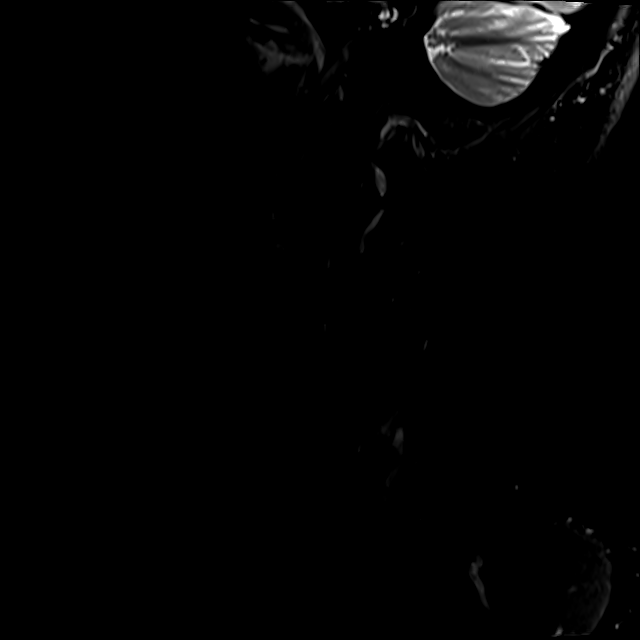
[im 5/17]
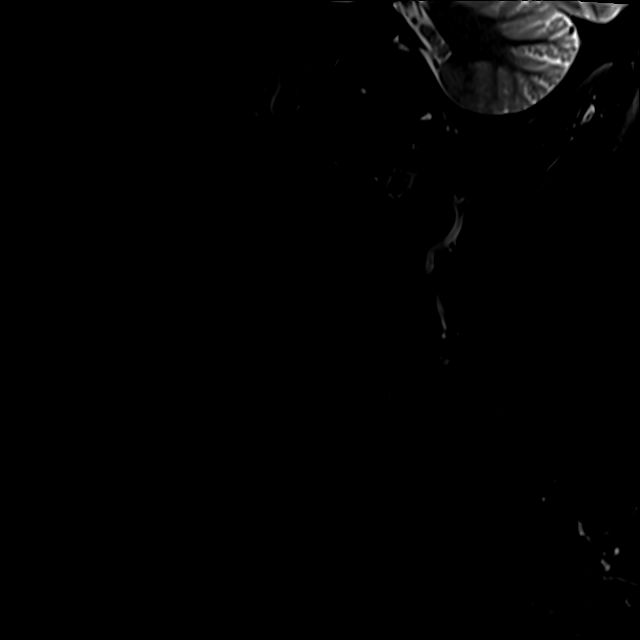

[Series 9: T2 · axial · 3.0mm · 0.50mm/px · z∈[-64,+53]mm · 11 of 38 slices shown (2 of 3)]
[im 1/38]
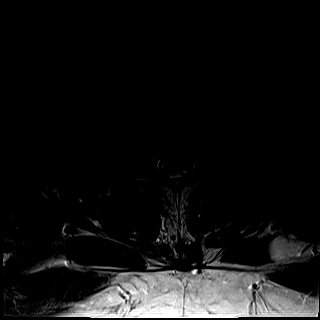
[im 4/38]
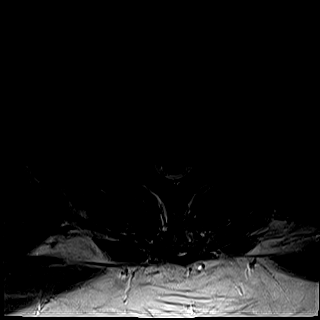
[im 8/38]
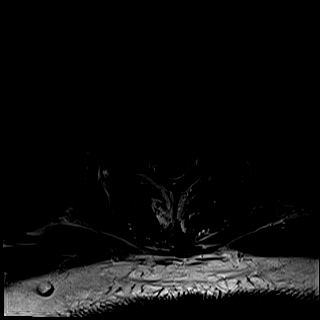
[im 12/38]
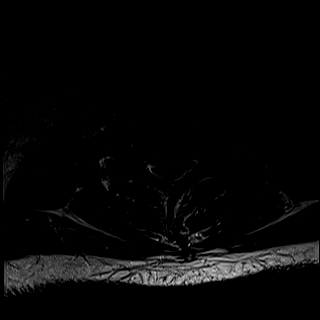
[im 15/38]
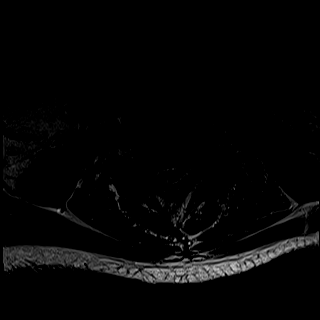
[im 19/38]
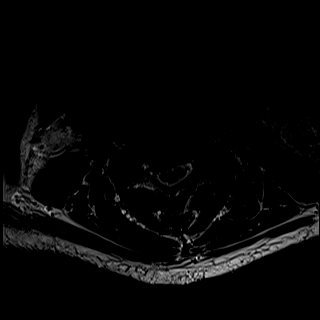
[im 23/38]
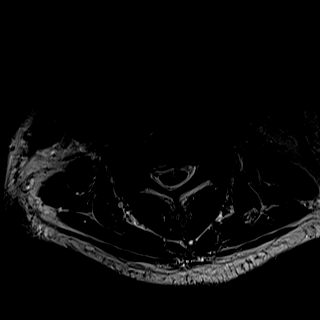
[im 26/38]
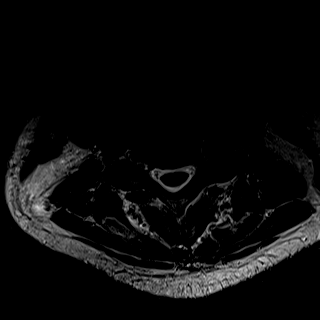
[im 30/38]
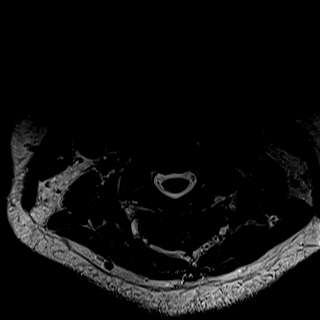
[im 34/38]
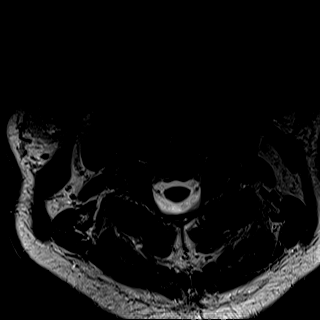
[im 38/38]
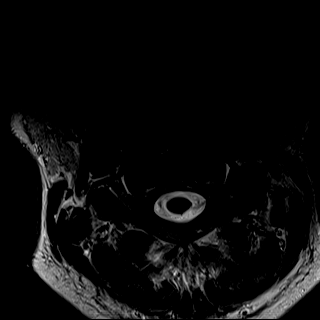

[Series 101: T2 · axial · 3.0mm · 0.50mm/px · z∈[-64,+53]mm · 11 of 38 slices shown (3 of 3)]
[im 1/38]
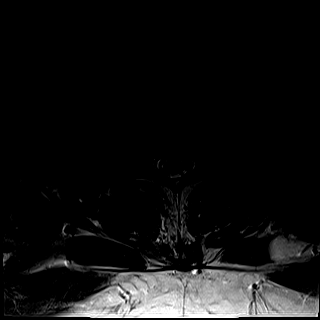
[im 4/38]
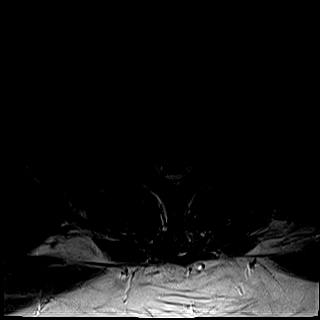
[im 8/38]
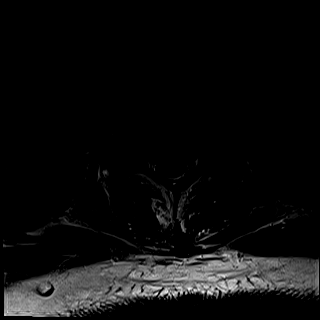
[im 12/38]
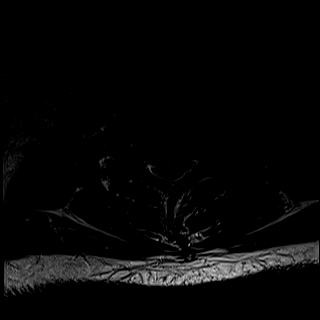
[im 15/38]
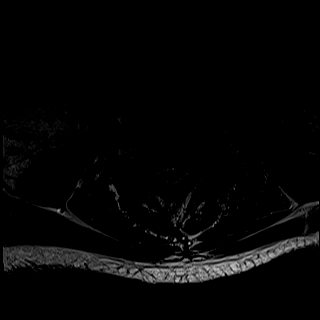
[im 19/38]
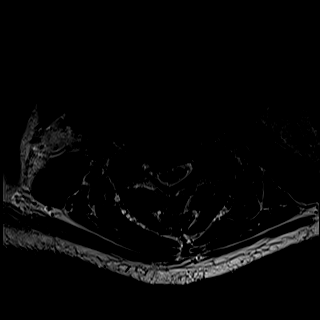
[im 23/38]
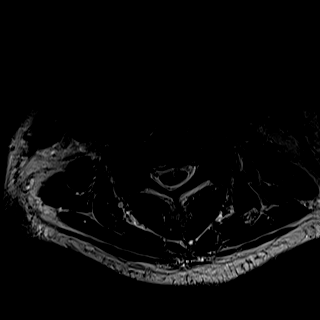
[im 26/38]
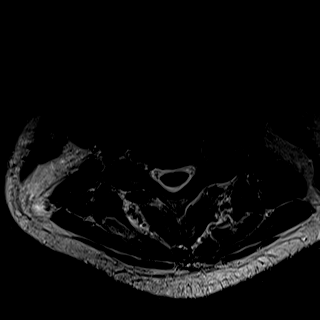
[im 30/38]
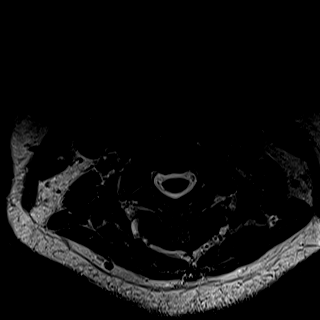
[im 34/38]
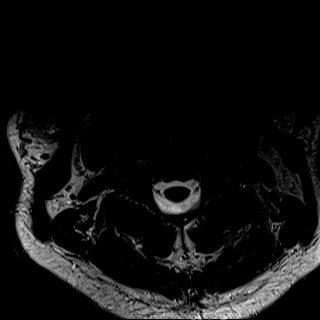
[im 38/38]
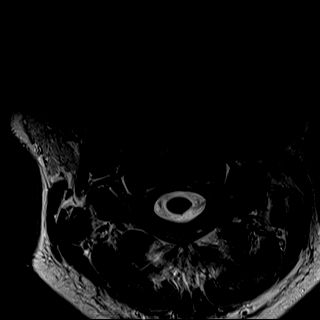

[34 of 48 positions shown; findings below may reference images not displayed]

FINDINGS: Alignment: Normal alignment of the cervical vertebral bodies. Mild
straightening of the normal cervical curvature.

Vertebrae: No acute bony findings or worrisome bone lesions. Mild
endplate reactive changes mainly in the lower cervical spine.

Cord: Normal cord signal intensity.  No cord lesions or syrinx.

Posterior Fossa, vertebral arteries, paraspinal tissues: No
significant findings. Degenerative changes are noted at C1-2 with
mild pannus formation but no significant mass effect on the upper
cervical cord.

Disc levels:

C2-3: No significant findings.

C3-4: No significant findings.

C4-5: Moderate to large right-sided disc extrusion. Significant mass
effect on the right side of the thecal sac contacting and slightly
deforming the right-side of the cervical cord. No foraminal
stenosis.

C5-6: Degenerative disc disease with a bulging annulus, osteophytic
ridging and uncinate spurring. There is also a broad-based disc
protrusion asymmetric right with asymmetric mass effect on the right
side of the thecal sac with near obliteration of anterior CSF space
and very slight flattening of the anterior cord. Mild medial
foraminal encroachment also noted bilaterally.

C6-7: Degenerative disc disease with a bulging degenerated annulus
and osteophytic ridging. There is also a broad-based central disc
protrusion with flattening of the ventral thecal sac and near
obliteration of the ventral CSF space. Mild foraminal narrowing
bilaterally.

C7-T1: Bulging degenerated annulus along with osteophytic ridging
and uncinate spurring. There is mild mass effect on both sides of
the thecal sac, left greater than right and mild bilateral foraminal
stenosis.

T1-2: Diffuse bulging disc.
IMPRESSION: 1. Degenerative cervical spondylosis with multilevel disc disease
and facet disease.
2. Moderate to large right-sided disc extrusion at C4-5 with mass
effect on the right side of the cervical cord.
3. Broad-based disc protrusion at C5-6 asymmetric right with
asymmetric mass effect on the right side of the thecal sac. There is
also mild medial foraminal encroachment bilaterally.
4. Broad-based central disc protrusion at C6-7 with flattening of
the ventral thecal sac and near obliteration of the ventral CSF
space. Mild foraminal narrowing bilaterally also noted.
5. Diffuse bulging annulus at C7-T1 with mild mass effect on both
sides of the thecal sac, left greater than right. Mild bilateral
foraminal stenosis.

## 2024-03-21 NOTE — Patient Instructions (Signed)
 Take 1.5 tablets today only then Continue taking Warfarin 1 tablet daily except  1.5 tablets on MONDAYS AND FRIDAYS. INR in 5 weeks.

## 2024-03-27 ENCOUNTER — Ambulatory Visit (INDEPENDENT_AMBULATORY_CARE_PROVIDER_SITE_OTHER): Admitting: Internal Medicine

## 2024-03-27 ENCOUNTER — Encounter: Payer: Self-pay | Admitting: Internal Medicine

## 2024-03-27 VITALS — BP 130/84 | HR 96 | Ht 75.0 in | Wt 351.2 lb

## 2024-03-27 DIAGNOSIS — E538 Deficiency of other specified B group vitamins: Secondary | ICD-10-CM

## 2024-03-27 DIAGNOSIS — I10 Essential (primary) hypertension: Secondary | ICD-10-CM | POA: Diagnosis not present

## 2024-03-27 DIAGNOSIS — R600 Localized edema: Secondary | ICD-10-CM | POA: Diagnosis not present

## 2024-03-27 DIAGNOSIS — G894 Chronic pain syndrome: Secondary | ICD-10-CM | POA: Diagnosis not present

## 2024-03-27 DIAGNOSIS — Z125 Encounter for screening for malignant neoplasm of prostate: Secondary | ICD-10-CM

## 2024-03-27 DIAGNOSIS — F331 Major depressive disorder, recurrent, moderate: Secondary | ICD-10-CM

## 2024-03-27 DIAGNOSIS — R296 Repeated falls: Secondary | ICD-10-CM | POA: Diagnosis not present

## 2024-03-27 DIAGNOSIS — E039 Hypothyroidism, unspecified: Secondary | ICD-10-CM

## 2024-03-27 DIAGNOSIS — E7849 Other hyperlipidemia: Secondary | ICD-10-CM

## 2024-03-27 MED ORDER — HYDROCODONE-ACETAMINOPHEN 10-325 MG PO TABS: 1.0000 | ORAL_TABLET | Freq: Three times a day (TID) | ORAL | 0 refills | Status: DC | PRN

## 2024-03-27 MED ORDER — VENLAFAXINE HCL ER 150 MG PO CP24: 300.0000 mg | ORAL_CAPSULE | Freq: Every day | ORAL | 3 refills | Status: DC

## 2024-03-27 NOTE — Patient Instructions (Addendum)
 I'm glad you are sleeping better  with your new bed!!  Losing weight at the rate you are doing is also an excellent  rate and will take some stress off of your back   I am increasing your Effexor  to 300 mg daily to help your anxiety   Continue wellbutrin  at 300 mg daily   Dr Mont Antis DID order physical therapy to strengthen your legs  along with the braces

## 2024-03-27 NOTE — Progress Notes (Signed)
 Subjective:  Patient ID: Johnathan [name], male    DOB: 07/04/1956  Age: 68 y.o. MRN: [id number]  CC: The primary encounter diagnosis was Prostate cancer screening. Diagnoses of Other hyperlipidemia, B12 deficiency, Essential hypertension, Acquired hypothyroidism, Bilateral leg edema, Frequent falls, Chronic pain disorder, and Moderate episode of recurrent major depressive disorder (HCC) were also pertinent to this visit.   HPI Johnathan [name] presents for  Chief Complaint  Patient presents with   Medical Management of Chronic Issues    3 month follow up   1) bilateral foot drop with recurrent falls .  Ed  was referred to neursurgery in April and seen on  April 30 by Jodeen Munch: his MRI lumbar spine was reviewed,  and  lumbar stenosis with neurogenic claudication, though the biggest issue for him is balance and gait challenges causing falls due to his foot drop. he was measured 2 weeks  for custom AFOs and PT  but has not received them yet.  Has fallen at least 3 times since then with no injuries sustained. However because of his morbid obesity he was unable to rise and his wife was unable to help him get up.   He has been referred to neurology and to a  neuromuscular specialist. He is unable to ambulate > 20-30 feet because his leg muscles give out and he falls.  He is unwilling to consider a motorized scooter but is willing to reconsider the scooter if the braces do no help   2) Morbid obesity:  he has been motivated to lose weight since his  last fall and inability to rise unassisted  and  has lost 12 lbs due to following his  wife's reduced eating plan.  Unintentional intermittent fasting.   2 meals daily .  Not hungry in spite of reduced appetite.  Last meal by 2 -4 pm  3) Depression : admits to increased stress due to wife's anxiety and health conditions.  Finally sleeping in the new bed they purchased  instead of a recliner.  Vibration,  adjustable foot and head sleeping much  better     Outpatient Medications Prior to Visit  Medication Sig Dispense Refill   amiodarone  (PACERONE ) 200 MG tablet Take 1 tablet by mouth twice daily 180 tablet 3   B Complex Vitamins (B COMPLEX 50 PO) Take 1 tablet by mouth daily.      buPROPion  (WELLBUTRIN  XL) 300 MG 24 hr tablet Take 1 tablet (300 mg total) by mouth daily. 90 tablet 3   Cholecalciferol  (VITAMIN D3) 2000 UNITS TABS Take 2,000 Units by mouth daily.     docusate sodium  (COLACE) 100 MG capsule Take 400 mg by mouth daily.      Ferrous Gluconate  324 (37.5 Fe) MG TABS Take 1 tablet by mouth daily.      furosemide  (LASIX ) 40 MG tablet Take 1 tablet (40 mg total) by mouth daily as needed. 90 tablet 0   gabapentin  (NEURONTIN ) 300 MG capsule TAKE 1 CAPSULE BY MOUTH THREE TIMES A DAY 270 capsule 3   GLUCOSAMINE HCL-MSM PO Take 1 tablet by mouth daily.     hydrochlorothiazide  (HYDRODIURIL ) 25 MG tablet Take 1 tablet (25 mg total) by mouth daily. 90 tablet 3   levothyroxine  (SYNTHROID ) 25 MCG tablet TAKE 1 TABLET BY MOUTH ONCE DAILY BEFORE BREAKFAST 90 tablet 0   losartan  (COZAAR ) 100 MG tablet Take 1 tablet (100 mg total) by mouth daily. 90 tablet 3   metoprolol  succinate (TOPROL -XL) 50 MG  24 hr tablet TAKE 1 TABLET BY MOUTH DAILY. 90 tablet 1   Multiple Vitamin (MULTIVITAMIN) tablet Take 1 tablet by mouth daily.     Potassium Chloride  ER 20 MEQ TBCR Take 20 mEq by mouth 2 (two) times daily as needed. 180 tablet 3   propranolol  (INDERAL ) 20 MG tablet Take 1 tablet (20 mg total) by mouth 3 (three) times daily as needed. Reported on 03/22/2016 270 tablet 3   rosuvastatin  (CRESTOR ) 10 MG tablet Take 1 tablet (10 mg total) by mouth daily at 2 PM. 90 tablet 3   sildenafil  (REVATIO ) 20 MG tablet Take 1 tablet (20 mg total) by mouth 3 (three) times daily. 90 tablet 1   tadalafil  (CIALIS ) 20 MG tablet Take 1 tablet (20 mg total) by mouth daily as needed for erectile dysfunction. 30 tablet 1   vitamin B-12 (CYANOCOBALAMIN ) 500 MCG tablet  Take 500 mcg by mouth daily.     vitamin E  400 UNIT capsule Take 400 Units by mouth daily.     warfarin (COUMADIN ) 5 MG tablet TAKE 1 TO 1 & 1/2 (ONE & ONE-HALF) TABLETS BY MOUTH ONCE DAILY AS DIRECTED BY ANTI-COAG CLINIC 100 tablet 0   HYDROcodone -acetaminophen  (NORCO) 10-325 MG tablet Take 1 tablet by mouth every 8 (eight) hours as needed. 90 tablet 0   venlafaxine  XR (EFFEXOR -XR) 150 MG 24 hr capsule Take 1 capsule (150 mg total) by mouth daily with breakfast. 90 capsule 3   No facility-administered medications prior to visit.    Review of Systems;  Patient denies headache, fevers, malaise, unintentional weight loss, skin rash, eye pain, sinus congestion and sinus pain, sore throat, dysphagia,  hemoptysis , cough, dyspnea, wheezing, chest pain, palpitations, orthopnea, edema, abdominal pain, nausea, melena, diarrhea, constipation, flank pain, dysuria, hematuria, urinary  Frequency, nocturia, numbness, tingling, seizures,  Focal weakness, Loss of consciousness,  Tremor, insomnia, depression, anxiety, and suicidal ideation.      Objective:  BP 130/84   Pulse 96   Ht 6' 3 (1.905 m)   Wt (!) 351 lb 3.2 oz (159.3 kg)   SpO2 95%   BMI 43.90 kg/m   BP Readings from Last 3 Encounters:  03/27/24 130/84  02/02/24 114/82  12/26/23 128/82    Wt Readings from Last 3 Encounters:  03/27/24 (!) 351 lb 3.2 oz (159.3 kg)  02/02/24 (!) 356 lb (161.5 kg)  12/26/23 (!) 367 lb 12.8 oz (166.8 kg)    Physical Exam Vitals reviewed.  Constitutional:      General: He is not in acute distress.    Appearance: Normal appearance. He is obese. He is not ill-appearing, toxic-appearing or diaphoretic.  HENT:     Head: Normocephalic.   Eyes:     General: No scleral icterus.       Right eye: No discharge.        Left eye: No discharge.     Conjunctiva/sclera: Conjunctivae normal.    Cardiovascular:     Rate and Rhythm: Normal rate and regular rhythm.     Heart sounds: Normal heart sounds.   Pulmonary:     Effort: Pulmonary effort is normal. No respiratory distress.     Breath sounds: Normal breath sounds.   Musculoskeletal:        General: Normal range of motion.     Cervical back: Normal range of motion.   Skin:    General: Skin is warm and dry.   Neurological:     General: No focal deficit present.  Mental Status: He is alert and oriented to person, place, and time. Mental status is at baseline.   Psychiatric:        Mood and Affect: Mood normal.        Behavior: Behavior normal.        Thought Content: Thought content normal.        Judgment: Judgment normal.    Lab Results  Component Value Date   HGBA1C 5.6 12/22/2022   HGBA1C 5.8 09/15/2022   HGBA1C 5.4 03/19/2022    Lab Results  Component Value Date   CREATININE 1.07 06/28/2023   CREATININE 1.11 03/28/2023   CREATININE 0.77 12/22/2022    Lab Results  Component Value Date   WBC 7.3 03/28/2023   HGB 13.7 03/28/2023   HCT 42.0 03/28/2023   PLT 165.0 03/28/2023   GLUCOSE 95 06/28/2023   CHOL 118 03/27/2024   TRIG 103 03/27/2024   HDL 49 03/27/2024   LDLDIRECT 54.0 09/15/2022   LDLCALC 50 03/27/2024   ALT 28 06/28/2023   AST 22 06/28/2023   NA 141 06/28/2023   K 4.4 06/28/2023   CL 106 06/28/2023   CREATININE 1.07 06/28/2023   BUN 17 06/28/2023   CO2 27 06/28/2023   TSH 4.53 06/28/2023   PSA 0.60 03/28/2023   INR 1.3 (A) 03/21/2024   HGBA1C 5.6 12/22/2022   MICROALBUR 3.0 (H) 09/15/2022    MR Lumbar Spine Wo Contrast Result Date: 01/28/2024 CLINICAL DATA:  67 year old male with low back pain, multiple falls, decreased strength in both legs, right foot drop. EXAM: MRI LUMBAR SPINE WITHOUT CONTRAST TECHNIQUE: Multiplanar, multisequence MR imaging of the lumbar spine was performed. No intravenous contrast was administered. COMPARISON:  Lumbar radiographs 04/03/2021. FINDINGS: Segmentation:  Normal on the comparison. Alignment: Straightening but improved lumbar lordosis compared to the  prior radiographs. Chronic mild degenerative appearing retrolisthesis of L5 on S1. Vertebrae: Bulky and widespread degenerative endplate spurring with chronic associated degenerative endplate marrow signal changes. There is patchy mild marrow edema inferiorly in the L2 body (series 6 image 12). Superimposed L3 inferior endplate Schmorl's node, L4 probable benign vertebral hemangioma, and other benign hemangiomas occasionally noted in the lower thoracic spine and sacrum (no follow-up imaging recommended). Intact visible sacrum and SI joints. No other acute osseous abnormality. Conus medullaris and cauda equina: Conus extends to the L1 level. No lower spinal cord or conus signal abnormality. Paraspinal and other soft tissues: Negative. Disc levels: T11-T12: Partially visible disc osteophyte complex and facet hypertrophy. No definite spinal stenosis. Partially visible T11 foraminal stenosis which may be greater on the left. T12-L1: Disc space loss. Circumferential disc osteophyte complex. Mild facet and ligament flavum hypertrophy. Spinal stenosis with mild spinal cord mass effect here just above the conus (series 5, image 12). Mild bilateral T12 foraminal stenosis. L1-L2: Circumferential disc osteophyte complex. Moderate facet and ligament flavum hypertrophy. Epidural lipomatosis. Mild to moderate spinal stenosis here at the tip of the conus. Mild if any conus mass effect. Asymmetric bulky foraminal disc. Moderate to severe bilateral L1 foraminal stenosis. L2-L3: Severe disc space loss with evidence of vacuum disc. Bulky circumferential disc osteophyte complex asymmetric to the right. Moderate ligament flavum, mild facet hypertrophy. Epidural lipomatosis. Moderate to severe spinal and right greater than left lateral recess stenosis (L3 nerve level series 9, image 16). Moderate left and moderate to severe right L2 foraminal stenosis. L3-L4: Disc space loss with bulky circumferential disc osteophyte complex. Mild to  moderate facet and ligament flavum hypertrophy. Regression of epidural  lipomatosis at this level. Mild spinal stenosis. Moderate to severe bilateral L3 foraminal stenosis. L4-L5: Severe disc space loss. Vacuum disc. Bulky circumferential disc osteophyte complex with a broad-based posterior component in the midline. Moderate facet and ligament flavum hypertrophy. Mild epidural lipomatosis. Moderate spinal stenosis. Moderate to severe lateral recess stenosis greater on the right (right L5 nerve level series 9, image 25). Moderate to severe right greater than left L4 foraminal stenosis. L5-S1: Mild retrolisthesis. Disc space loss. Bulky circumferential disc osteophyte complex. Mild facet and ligament flavum hypertrophy. No spinal stenosis. Mild lateral recess stenosis primarily on the right. Moderate to severe bilateral L5 foraminal stenosis appears greater on the left. IMPRESSION: 1. Diffuse lumbar spine degeneration with bulky disc, endplate, posterior element degeneration. Some superimposed epidural lipomatosis. Isolated mild degenerative L2 vertebral body marrow edema. Widespread 2. Multifactorial spinal stenosis from T12-L1 through L4-L5, frequently Moderate and is Severe at L2-L3. Up to mild mass effect on the lower thoracic spinal cord and the conus but, no associated signal abnormality. 3. Widespread associated moderate and severe lateral recess and neural foraminal stenosis, see details above. Electronically Signed   By: Marlise Simpers M.D.   On: 01/28/2024 07:56    Assessment & Plan:  .Prostate cancer screening -     PSA, Medicare  Other hyperlipidemia -     Lipid Panel w/reflex Direct LDL -     Comprehensive metabolic panel with GFR; Future  B12 deficiency Assessment & Plan: Managed with supplementation    Lab Results  Component Value Date   VITAMINB12 >1501 (H) 06/28/2023     Orders: -     B12 and Folate Panel  Essential hypertension -     Comprehensive metabolic panel with  GFR  Acquired hypothyroidism Assessment & Plan: Managed with low dose Synthroid .  Lab Results  Component Value Date   TSH 4.53 06/28/2023     Orders: -     TSH; Future  Bilateral leg edema Assessment & Plan: Multifactorial with  lymphedema,  aggravated by atrial fibrillation, morbid obesity and untreated sleep apnea. Continue furosemde 40 mg    Frequent falls Assessment & Plan: Secondary to leg weakness and right foot drop.  He has been referred to neurosurgery  neurology,  and a neuromuscular specialist.  Orthotics and leg braces have been ordered .  He declines rx for motorizied scooter.   Chronic pain disorder Assessment & Plan: Patient's  chronic pain is managed with hydrocodone  every 8 hours  qty #90/month.  NSAIDS are contrindicated due to chornic anticoagulation with warfarin. Refill history confirmed via [place] Controlled Substance databas, accessed by me today.. refills for March, April  May sent to pharmacy    Moderate episode of recurrent major depressive disorder [facility]) Assessment & Plan: Increased worry regarding his wife's health issues have been cited as reasons for his depressed mood.  He requests incraese in Effexor  dose to 300 mg,  currently taking 150 mg along  with  300 mg wellbutrin .  Given his BMI > 40 , he may require higher than usual doses.  Will increase dose as a 30 day trial    Other orders -     Venlafaxine  HCl ER; Take 2 capsules (300 mg total) by mouth daily with breakfast.  Dispense: 180 capsule; Refill: 3 -     HYDROcodone -Acetaminophen ; Take 1 tablet by mouth every 8 (eight) hours as needed.  Dispense: 90 tablet; Refill: 0 -     HYDROcodone -Acetaminophen ; Take 1 tablet by mouth every 8 (eight) hours as  needed.  Dispense: 90 tablet; Refill: 0 -     HYDROcodone -Acetaminophen ; Take 1 tablet by mouth every 8 (eight) hours as needed.  Dispense: 90 tablet; Refill: 0     I spent 40 minutes on the day of this face to face encounter reviewing patient's   most recent visit with cardiology,  and neurosurgery ,  prior relevant surgical and non surgical procedures, recent  labs and imaging studies, counseling on weight management,  reviewing the assessment and plan with patient, and post visit ordering and reviewing of  diagnostics and therapeutics with patient  .   Follow-up: Return in about 3 months (around 06/27/2024) for chronic pain management.   Thersia Flax, MD

## 2024-03-28 LAB — COMPREHENSIVE METABOLIC PANEL WITH GFR
ALT: 20 U/L (ref 0–53)
AST: 19 U/L (ref 0–37)
Albumin: 4.1 g/dL (ref 3.5–5.2)
Alkaline Phosphatase: 113 U/L (ref 39–117)
BUN: 17 mg/dL (ref 6–23)
CO2: 28 meq/L (ref 19–32)
Calcium: 8.7 mg/dL (ref 8.4–10.5)
Chloride: 107 meq/L (ref 96–112)
Creatinine, Ser: 1.07 mg/dL (ref 0.40–1.50)
GFR: 71.23 mL/min (ref >60.00)
Glucose, Bld: 85 mg/dL (ref 70–99)
Potassium: 4.4 meq/L (ref 3.5–5.1)
Sodium: 143 meq/L (ref 135–145)
Total Bilirubin: 0.5 mg/dL (ref 0.2–1.2)
Total Protein: 6.8 g/dL (ref 6.0–8.3)

## 2024-03-28 LAB — LIPID PANEL W/REFLEX DIRECT LDL
Cholesterol: 118 mg/dL (ref <200)
HDL: 49 mg/dL (ref >=40)
LDL Cholesterol (Calc): 50 mg/dL
Non-HDL Cholesterol (Calc): 69 mg/dL (ref <130)
Total CHOL/HDL Ratio: 2.4 (calc) (ref <5.0)
Triglycerides: 103 mg/dL (ref <150)

## 2024-03-28 LAB — PSA, MEDICARE: PSA: 0.79 ng/mL (ref 0.10–4.00)

## 2024-03-28 LAB — B12 AND FOLATE PANEL
Folate: >23.2 ng/mL (ref >5.9)
Vitamin B-12: >1500 pg/mL — ABNORMAL HIGH (ref 211–911)

## 2024-03-28 MED ORDER — HYDROCODONE-ACETAMINOPHEN 10-325 MG PO TABS: 1.0000 | ORAL_TABLET | Freq: Three times a day (TID) | ORAL | 0 refills | Status: DC | PRN

## 2024-03-28 NOTE — Assessment & Plan Note (Signed)
 Secondary to leg weakness and right foot drop.  He has been referred to neurosurgery  neurology,  and a neuromuscular specialist.  Orthotics and leg braces have been ordered .  He declines rx for motorizied scooter.

## 2024-03-28 NOTE — Assessment & Plan Note (Signed)
 Increased worry regarding his wife's health issues have been cited as reasons for his depressed mood.  He requests incraese in Effexor  dose to 300 mg,  currently taking 150 mg along  with  300 mg wellbutrin .  Given his BMI > 40 , he may require higher than usual doses.  Will increase dose as a 30 day trial

## 2024-03-28 NOTE — Assessment & Plan Note (Addendum)
 Managed with low dose Synthroid .  Lab Results  Component Value Date   TSH 4.53 06/28/2023

## 2024-03-28 NOTE — Assessment & Plan Note (Signed)
 Patient's  chronic pain is managed with hydrocodone every 8 hours  qty #90/month.  NSAIDS are contrindicated due to chornic anticoagulation with warfarin. Refill history confirmed via [place] Controlled Substance databas, accessed by me today.. refills for March, April  May sent to pharmacy

## 2024-03-28 NOTE — Assessment & Plan Note (Signed)
 Managed with supplementation    Lab Results  Component Value Date   VITAMINB12 >1501 (H) 06/28/2023

## 2024-03-28 NOTE — Assessment & Plan Note (Signed)
 Multifactorial with  lymphedema,  aggravated by atrial fibrillation, morbid obesity and untreated sleep apnea. Continue furosemde 40 mg

## 2024-03-29 ENCOUNTER — Encounter (INDEPENDENT_AMBULATORY_CARE_PROVIDER_SITE_OTHER): Admitting: Neurosurgery

## 2024-03-29 ENCOUNTER — Ambulatory Visit: Payer: Self-pay | Admitting: Internal Medicine

## 2024-03-29 ENCOUNTER — Encounter: Payer: Self-pay | Admitting: Neurosurgery

## 2024-03-29 DIAGNOSIS — Z538 Procedure and treatment not carried out for other reasons: Secondary | ICD-10-CM

## 2024-03-29 NOTE — Progress Notes (Unsigned)
 Referring Physician:  Marylynn Verneita CROME, MD [address] [place],  [place] [address]  Primary Physician:  Johnathan Verneita CROME, MD  History of Present Illness: 03/29/2024   02/02/2024 [name] is here today with a chief complaint of bilateral foot drop.  This has been ongoing for years, and slowly worsening over time.  He has known neuropathy.    He does have back pain, but his foot drop and imbalance are the bigggest issue for him.  Bowel/Bladder Dysfunction: none  Conservative measures:  Physical therapy:  has not participated in PT Multimodal medical therapy including regular antiinflammatories:  Gabapentin , Hydrocodone  Injections: no epidural steroid injections  Past Surgery: none  Johnathan [name] Half has no symptoms of cervical myelopathy.  The symptoms are causing a significant impact on the patient's life.   I have utilized the care everywhere function in epic to review the outside records available from external health systems.  Review of Systems:  A 10 point review of systems is negative, except for the pertinent positives and negatives detailed in the HPI.  Past Medical History: Past Medical History:  Diagnosis Date   Arthritis    a. chronic joint pain   B12 deficiency    Chronic systolic CHF (congestive heart failure) (HCC)    a. echo 2013: EF of 50-55%, normal right ventricular systolic pressure, normal left atrium; b. EF 40-45%, inadequate for LV wall motion, mild MR, LA severely dilated @ 52 mm, PASP nl   Complication of anesthesia    Dysrhythmia    Erythema migrans (Lyme disease) 04/08/2015   History of DVT (deep vein thrombosis) 2004   s/p bariatric surgery   History of stress test    a. there was no ST segment deviation noted during stress,    There is a small defect of moderate severity present in the apex location, suggestive of apical ischemia, intermediate risk, calculated EF 21% but visually appears to be 35-40%   Hypertension     Hypothyroidism    Neuropathy    Obesity    OSA (obstructive sleep apnea)    a. not compliant with CPAP   PAF (paroxysmal atrial fibrillation) (HCC)    a. CHADSVASc at least 2 (HTN and vascular disease); b. on Eliquis     Pre-diabetes     Past Surgical History: Past Surgical History:  Procedure Laterality Date   ARTHROSCOPIC REPAIR ACL Bilateral 1993   bilateral knee x2 on left and x2 on right   BARIATRIC SURGERY  2004   Rou en Y    BARIATRIC SURGERY     CHOLECYSTECTOMY  1982   ELECTROPHYSIOLOGIC STUDY N/A 06/20/2015   Procedure: CARDIOVERSION;  Surgeon: Evalene JINNY Lunger, MD;  Location: [facility];  Service: Cardiovascular;  Laterality: N/A;   GANGLION CYST EXCISION     KNEE ARTHROPLASTY Right 06/20/2017   Procedure: COMPUTER ASSISTED TOTAL KNEE ARTHROPLASTY;  Surgeon: Mardee Lynwood SQUIBB, MD;  Location: [facility];  Service: Orthopedics;  Laterality: Right;   MENISCUS DEBRIDEMENT Left 2011   Left knee   REPLACEMENT TOTAL KNEE Left 07/2013   REVERSE SHOULDER ARTHROPLASTY Right 12/30/2022   Procedure: REVERSE SHOULDER ARTHROPLASTY;  Surgeon: Dozier Soulier, MD;  Location: [facility];  Service: Orthopedics;  Laterality: Right;   TONSILLECTOMY      Allergies: Allergies as of 03/29/2024 - Review Complete 03/29/2024  Allergen Reaction Noted   Eliquis  [apixaban ] Itching 11/05/2021   Xarelto [rivaroxaban] Hives 11/05/2021    Medications:  Current Outpatient Medications:  amiodarone  (PACERONE ) 200 MG tablet, Take 1 tablet by mouth twice daily, Disp: 180 tablet, Rfl: 3   B Complex Vitamins (B COMPLEX 50 PO), Take 1 tablet by mouth daily. , Disp: , Rfl:    buPROPion  (WELLBUTRIN  XL) 300 MG 24 hr tablet, Take 1 tablet (300 mg total) by mouth daily., Disp: 90 tablet, Rfl: 3   Cholecalciferol  (VITAMIN D3) 2000 UNITS TABS, Take 2,000 Units by mouth daily., Disp: , Rfl:    docusate sodium  (COLACE) 100 MG capsule, Take 400 mg by mouth daily. , Disp: , Rfl:    Ferrous Gluconate  324 (37.5 Fe) MG  TABS, Take 1 tablet by mouth daily. , Disp: , Rfl:    furosemide  (LASIX ) 40 MG tablet, Take 1 tablet (40 mg total) by mouth daily as needed., Disp: 90 tablet, Rfl: 0   gabapentin  (NEURONTIN ) 300 MG capsule, TAKE 1 CAPSULE BY MOUTH THREE TIMES A DAY, Disp: 270 capsule, Rfl: 3   GLUCOSAMINE HCL-MSM PO, Take 1 tablet by mouth daily., Disp: , Rfl:    hydrochlorothiazide  (HYDRODIURIL ) 25 MG tablet, Take 1 tablet (25 mg total) by mouth daily., Disp: 90 tablet, Rfl: 3   HYDROcodone -acetaminophen  (NORCO) 10-325 MG tablet, Take 1 tablet by mouth every 8 (eight) hours as needed., Disp: 90 tablet, Rfl: 0   levothyroxine  (SYNTHROID ) 25 MCG tablet, TAKE 1 TABLET BY MOUTH ONCE DAILY BEFORE BREAKFAST, Disp: 90 tablet, Rfl: 0   losartan  (COZAAR ) 100 MG tablet, Take 1 tablet (100 mg total) by mouth daily., Disp: 90 tablet, Rfl: 3   metoprolol  succinate (TOPROL -XL) 50 MG 24 hr tablet, TAKE 1 TABLET BY MOUTH DAILY., Disp: 90 tablet, Rfl: 1   Multiple Vitamin (MULTIVITAMIN) tablet, Take 1 tablet by mouth daily., Disp: , Rfl:    Potassium Chloride  ER 20 MEQ TBCR, Take 20 mEq by mouth 2 (two) times daily as needed., Disp: 180 tablet, Rfl: 3   propranolol  (INDERAL ) 20 MG tablet, Take 1 tablet (20 mg total) by mouth 3 (three) times daily as needed. Reported on 03/22/2016, Disp: 270 tablet, Rfl: 3   rosuvastatin  (CRESTOR ) 10 MG tablet, Take 1 tablet (10 mg total) by mouth daily at 2 PM., Disp: 90 tablet, Rfl: 3   sildenafil  (REVATIO ) 20 MG tablet, Take 1 tablet (20 mg total) by mouth 3 (three) times daily., Disp: 90 tablet, Rfl: 1   tadalafil  (CIALIS ) 20 MG tablet, Take 1 tablet (20 mg total) by mouth daily as needed for erectile dysfunction., Disp: 30 tablet, Rfl: 1   venlafaxine  XR (EFFEXOR -XR) 150 MG 24 hr capsule, Take 2 capsules (300 mg total) by mouth daily with breakfast., Disp: 180 capsule, Rfl: 3   vitamin B-12 (CYANOCOBALAMIN ) 500 MCG tablet, Take 500 mcg by mouth daily., Disp: , Rfl:    vitamin E  400 UNIT capsule,  Take 400 Units by mouth daily., Disp: , Rfl:    warfarin (COUMADIN ) 5 MG tablet, TAKE 1 TO 1 & 1/2 (ONE & ONE-HALF) TABLETS BY MOUTH ONCE DAILY AS DIRECTED BY ANTI-COAG CLINIC, Disp: 100 tablet, Rfl: 0  Social History: Social History   Tobacco Use   Smoking status: Never    Passive exposure: Never   Smokeless tobacco: Never  Vaping Use   Vaping status: Never Used  Substance Use Topics   Alcohol use: Not Currently    Alcohol/week: 2.0 standard drinks of alcohol    Types: 2 Shots of liquor per week    Comment: Vodka and lemonade occasionally 2-3/week    Drug use: Yes  Comment: gummies delta A    Family Medical History: Family History  Problem Relation Age of Onset   Dementia Mother    Diabetes Father    Alzheimer's disease Father     Physical Examination: Vitals:   03/29/24 1430  BP: 120/72    General: Patient is in no apparent distress. Attention to examination is appropriate.  Neck:   Supple.  Full range of motion.  Respiratory: Patient is breathing without any difficulty.   NEUROLOGICAL:     Awake, alert, oriented to person, place, and time.  Speech is clear and fluent.   Cranial Nerves: Pupils equal round and reactive to light.  Facial tone is symmetric.  Facial sensation is symmetric. Shoulder shrug is symmetric. Tongue protrusion is midline.  There is no pronator drift.  Strength: Side Biceps Triceps Deltoid Interossei Grip Wrist Ext. Wrist Flex.  R 5 5 5 5 5 5 5   L 5 5 5 5 5 5 5    Side Iliopsoas Quads Hamstring PF DF EHL  R 5 5 5 4 2 2   L 5 5 5 5 2 2    Reflexes are 1+ and symmetric at the biceps, triceps, brachioradialis, patella and achilles.   Hoffman's is absent.   Bilateral upper and lower extremity sensation is symmetric to light touch.   It is diminished below the knees. No evidence of dysmetria noted.  Gait is abnormal - using walker.     Medical Decision Making  Imaging: MRI L spine 01/03/2024 IMPRESSION: 1. Diffuse lumbar spine  degeneration with bulky disc, endplate, posterior element degeneration. Some superimposed epidural lipomatosis. Isolated mild degenerative L2 vertebral body marrow edema. Widespread 2. Multifactorial spinal stenosis from T12-L1 through L4-L5, frequently Moderate and is Severe at L2-L3. Up to mild mass effect on the lower thoracic spinal cord and the conus but, no associated signal abnormality. 3. Widespread associated moderate and severe lateral recess and neural foraminal stenosis, see details above.     Electronically Signed   By: VEAR Hurst M.D.   On: 01/28/2024 07:56  I have personally reviewed the images and agree with the above interpretation.  Assessment and Plan: Mr. Sobieski is a pleasant 68 y.o. male with lumbar stenosis with neurogenic claudication, though the biggest issue for him is balance and gait challenges causing falls due to his foot drop.  I will send him for custom AFOs and PT.  To help work up his foot drop, I have referred him to a neuromuscular specialist.  I will see him back in 8 weeks.  If his neuropathy is causing pain or discomfort over time, we may consider spinal cord stimulator.  I spent a total of 30 minutes in this patient's care today. This time was spent reviewing pertinent records including imaging studies, obtaining and confirming history, performing a directed evaluation, formulating and discussing my recommendations, and documenting the visit within the medical record.      Thank you for involving me in the care of this patient.      Afrah Burlison K. Clois MD, [facility], [facility]

## 2024-04-09 ENCOUNTER — Ambulatory Visit: Admitting: Neurology

## 2024-04-09 ENCOUNTER — Encounter: Payer: Self-pay | Admitting: Neurology

## 2024-04-09 ENCOUNTER — Other Ambulatory Visit

## 2024-04-09 VITALS — BP 126/74 | HR 108 | Ht 75.0 in | Wt 351.0 lb

## 2024-04-09 DIAGNOSIS — G629 Polyneuropathy, unspecified: Secondary | ICD-10-CM

## 2024-04-09 DIAGNOSIS — M21371 Foot drop, right foot: Secondary | ICD-10-CM | POA: Diagnosis not present

## 2024-04-09 DIAGNOSIS — M21372 Foot drop, left foot: Secondary | ICD-10-CM

## 2024-04-09 DIAGNOSIS — B351 Tinea unguium: Secondary | ICD-10-CM | POA: Diagnosis not present

## 2024-04-09 NOTE — Progress Notes (Signed)
 [name] Clinic Note - Initial Visit   Date: 04/09/2024   [name] MRN: [id number] DOB: 03/18/1956   Dear Dr. Clois:  Thank you for your kind referral of Johnathan [name] for consultation of bilateral foot drop. Although his history is well known to you, please allow us  to reiterate it for the purpose of our medical record. The patient was accompanied to the clinic by wife who also provides collateral information.     Johnathan [name] is a 68 y.o. right-handed male with atrial fibrillation (on warfarin), hypothyroidism, hypertension, CHF, hypertension, hyperlipidemia, anxiety/depression, s/p bariatric surgery, and severe spinal stenosis at L2-3 presenting for evaluation of bilateral foot drop.   IMPRESSION/PLAN: Peripheral neuropathy manifesting with severe distal sensory loss, ataxia, and bilateral foot drop.  He also has numbness and weakness in the hands, which may be due to length-dependent neuropathy.  He does not have history of diabetes, alcohol use, family history of neuropathy, or exposure to chemotherapy.  Prior labs indicate normal TSH, vitamin B12, and folate. I had extensive discussion with the patient regarding the pathogenesis, etiology, management, and natural course of neuropathy. Neuropathy tends to be slowly progressive, especially if a treatable etiology is not identified. I discussed that in the vast majority of cases, despite checking for reversible causes, we are unable to find the underlying etiology and management is symptomatic.    - Check SPEP with [facility]  - Adjust gabapentin  to 300mg  in the morning and 600mg  at bedtime for better night time pain relief  - NCS/EMG was declined  - Patient educated on daily foot inspection, fall prevention, and safety precautions around the home.  - Always use gait rollator  - He was referred for PT already, but waiting to get AFOs before starting this  2.    Multilevel degenerative  lumbosacral spondylosis with lumbar spinal stenosis which is severe at L2-3 with neurogenic claudication.    - Followed by Dr. Clois  3.   Onychomycosis   - Podiatry referral   Return to clinic in 4 months  ------------------------------------------------------------- History of present illness: For the past 2-3 years, he has noticed weakness in his feet and burning pain involving the both feet.  Wife has noticed that he drags his feet sometimes and he is aware that his toe do not move very well.  For his pain, he takes gabapentin  300mg  and 300mg  in the afternoon, but has worsening pain at bedtime.  He also complains of numbness involving both feet and lower legs.  He has fallen 4 times this year, all which occurred when he was not using a walker.  He is a nonsmoker, does not drink alcohol, no family history of neuropathy.    Additionally, he complains of bilateral hand weakness and numbness.  He has a lot of joint pain.   Out-side paper records, electronic medical record, and images have been reviewed where available and summarized as:  MRI lumbar spine wo contrast 01/28/2024: 1. Diffuse lumbar spine degeneration with bulky disc, endplate, posterior element degeneration. Some superimposed epidural lipomatosis. Isolated mild degenerative L2 vertebral body marrow edema.  2.  Widespread multifactorial spinal stenosis from T12-L1 through L4-L5, frequently Moderate and is Severe at L2-L3. Up to mild mass effect on the lower thoracic spinal cord and the conus but, no associated signal abnormality. 3. Widespread associated moderate and severe lateral recess and neural foraminal stenosis, see details above.   Lab Results  Component Value Date   HGBA1C 5.6 12/22/2022  Lab Results  Component Value Date   VITAMINB12 >1500 (H) 03/27/2024   Lab Results  Component Value Date   TSH 4.53 06/28/2023   Lab Results  Component Value Date   ESRSEDRATE 17 09/04/2019    Past Medical History:   Diagnosis Date   Arthritis    a. chronic joint pain   B12 deficiency    Chronic systolic CHF (congestive heart failure) (HCC)    a. echo 2013: EF of 50-55%, normal right ventricular systolic pressure, normal left atrium; b. EF 40-45%, inadequate for LV wall motion, mild MR, LA severely dilated @ 52 mm, PASP nl   Complication of anesthesia    Dysrhythmia    Erythema migrans (Lyme disease) 04/08/2015   History of DVT (deep vein thrombosis) 2004   s/p bariatric surgery   History of stress test    a. there was no ST segment deviation noted during stress,    There is a small defect of moderate severity present in the apex location, suggestive of apical ischemia, intermediate risk, calculated EF 21% but visually appears to be 35-40%   Hypertension    Hypothyroidism    Neuropathy    Obesity    OSA (obstructive sleep apnea)    a. not compliant with CPAP   PAF (paroxysmal atrial fibrillation) (HCC)    a. CHADSVASc at least 2 (HTN and vascular disease); b. on Eliquis     Pre-diabetes     Past Surgical History:  Procedure Laterality Date   ARTHROSCOPIC REPAIR ACL Bilateral 1993   bilateral knee x2 on left and x2 on right   BARIATRIC SURGERY  2004   Rou en Y    BARIATRIC SURGERY     CHOLECYSTECTOMY  1982   ELECTROPHYSIOLOGIC STUDY N/A 06/20/2015   Procedure: CARDIOVERSION;  Surgeon: Evalene JINNY Lunger, MD;  Location: [facility] OR[facility];  Service: Cardiovascular;  Laterality: N/A;   GANGLION CYST EXCISION     KNEE ARTHROPLASTY Right 06/20/2017   Procedure: COMPUTER ASSISTED TOTAL KNEE ARTHROPLASTY;  Surgeon: Mardee Lynwood SQUIBB, MD;  Location: [facility] OR[facility];  Service: Orthopedics;  Laterality: Right;   MENISCUS DEBRIDEMENT Left 2011   Left knee   REPLACEMENT TOTAL KNEE Left 07/2013   REVERSE SHOULDER ARTHROPLASTY Right 12/30/2022   Procedure: REVERSE SHOULDER ARTHROPLASTY;  Surgeon: Dozier Soulier, MD;  Location: [facility];  Service: Orthopedics;  Laterality: Right;   TONSILLECTOMY       Medications:   Outpatient Encounter Medications as of 04/09/2024  Medication Sig   amiodarone  (PACERONE ) 200 MG tablet Take 1 tablet by mouth twice daily   B Complex Vitamins (B COMPLEX 50 PO) Take 1 tablet by mouth daily.    buPROPion  (WELLBUTRIN  XL) 300 MG 24 hr tablet Take 1 tablet (300 mg total) by mouth daily.   Cholecalciferol  (VITAMIN D3) 2000 UNITS TABS Take 2,000 Units by mouth daily.   docusate sodium  (COLACE) 100 MG capsule Take 400 mg by mouth daily.    Ferrous Gluconate  324 (37.5 Fe) MG TABS Take 1 tablet by mouth daily.    furosemide  (LASIX ) 40 MG tablet Take 1 tablet (40 mg total) by mouth daily as needed.   gabapentin  (NEURONTIN ) 300 MG capsule TAKE 1 CAPSULE BY MOUTH THREE TIMES A DAY (Patient taking differently: Take 300 mg by mouth 2 (two) times daily. TAKE 1 CAPSULE BY MOUTH TWO TIMES A DAY)   GLUCOSAMINE HCL-MSM PO Take 1 tablet by mouth daily.   hydrochlorothiazide  (HYDRODIURIL ) 25 MG tablet Take 1 tablet (25 mg total) by mouth  daily.   HYDROcodone -acetaminophen  (NORCO) 10-325 MG tablet Take 1 tablet by mouth every 8 (eight) hours as needed.   levothyroxine  (SYNTHROID ) 25 MCG tablet TAKE 1 TABLET BY MOUTH ONCE DAILY BEFORE BREAKFAST   losartan  (COZAAR ) 100 MG tablet Take 1 tablet (100 mg total) by mouth daily.   metoprolol  succinate (TOPROL -XL) 50 MG 24 hr tablet TAKE 1 TABLET BY MOUTH DAILY.   Multiple Vitamin (MULTIVITAMIN) tablet Take 1 tablet by mouth daily.   Potassium Chloride  ER 20 MEQ TBCR Take 20 mEq by mouth 2 (two) times daily as needed.   propranolol  (INDERAL ) 20 MG tablet Take 1 tablet (20 mg total) by mouth 3 (three) times daily as needed. Reported on 03/22/2016   rosuvastatin  (CRESTOR ) 10 MG tablet Take 1 tablet (10 mg total) by mouth daily at 2 PM.   sildenafil  (REVATIO ) 20 MG tablet Take 1 tablet (20 mg total) by mouth 3 (three) times daily.   venlafaxine  XR (EFFEXOR -XR) 150 MG 24 hr capsule Take 2 capsules (300 mg total) by mouth daily with breakfast.   vitamin B-12  (CYANOCOBALAMIN ) 500 MCG tablet Take 500 mcg by mouth daily.   vitamin E  400 UNIT capsule Take 400 Units by mouth daily.   warfarin (COUMADIN ) 5 MG tablet TAKE 1 TO 1 & 1/2 (ONE & ONE-HALF) TABLETS BY MOUTH ONCE DAILY AS DIRECTED BY ANTI-COAG CLINIC   tadalafil  (CIALIS ) 20 MG tablet Take 1 tablet (20 mg total) by mouth daily as needed for erectile dysfunction. (Patient not taking: Reported on 04/09/2024)   No facility-administered encounter medications on file as of 04/09/2024.    Allergies:  Allergies  Allergen Reactions   Eliquis  [Apixaban ] Itching    Red spots and severe itching   Xarelto [Rivaroxaban] Hives    Reaction in 2013    Family History: Family History  Problem Relation Age of Onset   Dementia Mother    Diabetes Father    Alzheimer's disease Father     Social History: Social History   Tobacco Use   Smoking status: Never    Passive exposure: Never   Smokeless tobacco: Never  Vaping Use   Vaping status: Never Used  Substance Use Topics   Alcohol use: Not Currently    Alcohol/week: 2.0 standard drinks of alcohol    Types: 2 Shots of liquor per week    Comment: Vodka and lemonade occasionally 2-3/week    Drug use: Yes    Comment: gummies delta A   Social History   Social History Narrative   Currently between jobs   Lives with Wife   1 Son (33) and 1 daughter (3)    2 grandchildren by daughter    1 dog and 1 cat    Enjoys gardening, movies, dining out.       Are you right handed or left handed? Right Handed    Are you currently employed ? No   What is your current occupation? Retired    Do you live at home alone? No    Who lives with you? Wife    What type of home do you live in: 1 story or 2 story? One story home        Vital Signs:  BP 126/74   Pulse (!) 108   Ht 6' 3 (1.905 m)   Wt (!) 351 lb (159.2 kg)   SpO2 96%   BMI 43.87 kg/m    Neurological Exam: MENTAL STATUS including orientation to time, place, person, recent and remote  memory, attention span and concentration, language, and fund of knowledge is normal.  Speech is not dysarthric.  CRANIAL NERVES: II:  No visual field defects.     III-IV-VI: Pupils equal round and reactive to light.  Normal conjugate, extra-ocular eye movements in all directions of gaze.  No nystagmus.  No ptosis.   V:  Normal facial sensation.    VII:  Normal facial symmetry and movements.   VIII:  Normal hearing and vestibular function.   IX-X:  Normal palatal movement.   XI:  Normal shoulder shrug and head rotation.   XII:  Normal tongue strength and range of motion, no deviation or fasciculation.  MOTOR:  Bilateral intrinsic hand muscle atrophy.  No fasciculations or abnormal movements.  No pronator drift.   Upper Extremity:  Right  Left  Deltoid  5/5   5/5   Biceps  5/5   5/5   Triceps  5/5   5/5   Infraspinatus 5/5  5/5  Medial pectoralis 5/5  5/5  Wrist extensors  5/5   5/5   Wrist flexors  5/5   5/5   Finger extensors  5/5   5/5   Finger flexors  4/5   4/5   Dorsal interossei  4/5   4/5   Abductor pollicis  4/5   4/5   Tone (Ashworth scale)  0  0   Lower Extremity:  Right  Left  Hip flexors  5/5   5/5   Knee flexors  5/5   5/5   Knee extensors  5/5   5/5   Dorsiflexors  3/5   4/5   Plantarflexors  3/5   4/5   Toe extensors  2/5   2/5   Toe flexors  2/5   2/5   Tone (Ashworth scale)  0  0   MSRs:                                           Right        Left brachioradialis 2+  2+  biceps 2+  2+  triceps 2+  2+  patellar 1+  1+  ankle jerk 0  0  Hoffman no  no  plantar response down  down   SENSORY:  Vibration is absent below the ankles bilaterally.  Temperature and pin prick reduced from lower legs into the feet   COORDINATION/GAIT: Normal finger-to- nose-finger.  Intact rapid alternating movements bilaterally.  Gait is mildly wide-based with bilateral steppage, stable assisted with rollator.       Thank you for allowing me to participate in patient's care.   If I can answer any additional questions, I would be pleased to do so.    Sincerely,    Shaughn Thomley K. Tobie, DO

## 2024-04-09 NOTE — Patient Instructions (Addendum)
 Adjust gabapentin  to 300mg  in the morning and 600mg  at bedtime  Check labs  Referral to podiatry

## 2024-04-11 ENCOUNTER — Other Ambulatory Visit: Payer: Self-pay | Admitting: Internal Medicine

## 2024-04-17 ENCOUNTER — Ambulatory Visit: Payer: Self-pay | Admitting: Neurology

## 2024-04-17 LAB — PROTEIN ELECTROPHORESIS, SERUM
Albumin ELP: 3.9 g/dL (ref 3.8–4.8)
Alpha 1: 0.3 g/dL (ref 0.2–0.3)
Alpha 2: 0.8 g/dL (ref 0.5–0.9)
Beta 2: 0.4 g/dL (ref 0.2–0.5)
Beta Globulin: 0.4 g/dL (ref 0.4–0.6)
Gamma Globulin: 1 g/dL (ref 0.8–1.7)
Total Protein: 6.9 g/dL (ref 6.1–8.1)

## 2024-04-17 LAB — IMMUNOFIXATION ELECTROPHORESIS
IgG (Immunoglobin G), Serum: 1161 mg/dL (ref 600–1540)
IgM, Serum: 98 mg/dL (ref 50–300)
Immunoglobulin A: 323 mg/dL — ABNORMAL HIGH (ref 70–320)

## 2024-04-24 ENCOUNTER — Ambulatory Visit: Payer: Self-pay | Admitting: Podiatry

## 2024-04-24 DIAGNOSIS — L603 Nail dystrophy: Secondary | ICD-10-CM

## 2024-04-24 NOTE — Progress Notes (Signed)
 Subjective:  Patient ID: Johnathan [name], male    DOB: [birth year],  MRN: [id number]  Chief Complaint  Patient presents with   Nail Problem    68 y.o. male presents with the above complaint. Patient presents with right hallux nail dystrophy hurts with ambulation or shoe pressure has not seen anyone else prior to seeing me denies any other acute complaints.  He would like to have it removed.  He would also like to make a permanent pain scale 7 out of 10 dull aching nature   Review of Systems: Negative except as noted in the HPI. Denies N/V/F/Ch.  Past Medical History:  Diagnosis Date   Arthritis    a. chronic joint pain   B12 deficiency    Chronic systolic CHF (congestive heart failure) (HCC)    a. echo 2013: EF of 50-55%, normal right ventricular systolic pressure, normal left atrium; b. EF 40-45%, inadequate for LV wall motion, mild MR, LA severely dilated @ 52 mm, PASP nl   Complication of anesthesia    Dysrhythmia    Erythema migrans (Lyme disease) 04/08/2015   History of DVT (deep vein thrombosis) 2004   s/p bariatric surgery   History of stress test    a. there was no ST segment deviation noted during stress,    There is a small defect of moderate severity present in the apex location, suggestive of apical ischemia, intermediate risk, calculated EF 21% but visually appears to be 35-40%   Hypertension    Hypothyroidism    Neuropathy    Obesity    OSA (obstructive sleep apnea)    a. not compliant with CPAP   PAF (paroxysmal atrial fibrillation) (HCC)    a. CHADSVASc at least 2 (HTN and vascular disease); b. on Eliquis     Pre-diabetes     Current Outpatient Medications:    amiodarone  (PACERONE ) 200 MG tablet, Take 1 tablet by mouth twice daily, Disp: 180 tablet, Rfl: 3   B Complex Vitamins (B COMPLEX 50 PO), Take 1 tablet by mouth daily. , Disp: , Rfl:    buPROPion  (WELLBUTRIN  XL) 300 MG 24 hr tablet, Take 1 tablet (300 mg total) by mouth daily., Disp: 90 tablet, Rfl:  3   Cholecalciferol  (VITAMIN D3) 2000 UNITS TABS, Take 2,000 Units by mouth daily., Disp: , Rfl:    docusate sodium  (COLACE) 100 MG capsule, Take 400 mg by mouth daily. , Disp: , Rfl:    Ferrous Gluconate  324 (37.5 Fe) MG TABS, Take 1 tablet by mouth daily. , Disp: , Rfl:    furosemide  (LASIX ) 40 MG tablet, Take 1 tablet (40 mg total) by mouth daily as needed., Disp: 90 tablet, Rfl: 0   gabapentin  (NEURONTIN ) 300 MG capsule, TAKE 1 CAPSULE BY MOUTH THREE TIMES A DAY (Patient taking differently: Take 300 mg by mouth 2 (two) times daily. TAKE 1 CAPSULE BY MOUTH TWO TIMES A DAY), Disp: 270 capsule, Rfl: 3   GLUCOSAMINE HCL-MSM PO, Take 1 tablet by mouth daily., Disp: , Rfl:    hydrochlorothiazide  (HYDRODIURIL ) 25 MG tablet, Take 1 tablet (25 mg total) by mouth daily., Disp: 90 tablet, Rfl: 3   HYDROcodone -acetaminophen  (NORCO) 10-325 MG tablet, Take 1 tablet by mouth every 8 (eight) hours as needed., Disp: 90 tablet, Rfl: 0   levothyroxine  (SYNTHROID ) 25 MCG tablet, TAKE 1 TABLET BY MOUTH ONCE DAILY BEFORE BREAKFAST, Disp: 90 tablet, Rfl: 0   losartan  (COZAAR ) 100 MG tablet, Take 1 tablet (100 mg total) by mouth daily., Disp: 90  tablet, Rfl: 3   metoprolol  succinate (TOPROL -XL) 50 MG 24 hr tablet, TAKE 1 TABLET BY MOUTH DAILY., Disp: 90 tablet, Rfl: 1   Multiple Vitamin (MULTIVITAMIN) tablet, Take 1 tablet by mouth daily., Disp: , Rfl:    Potassium Chloride  ER 20 MEQ TBCR, Take 20 mEq by mouth 2 (two) times daily as needed., Disp: 180 tablet, Rfl: 3   propranolol  (INDERAL ) 20 MG tablet, Take 1 tablet (20 mg total) by mouth 3 (three) times daily as needed. Reported on 03/22/2016, Disp: 270 tablet, Rfl: 3   rosuvastatin  (CRESTOR ) 10 MG tablet, TAKE 1 TABLET BY MOUTH ONCE DAILY AT  2PM, Disp: 90 tablet, Rfl: 0   sildenafil  (REVATIO ) 20 MG tablet, Take 1 tablet (20 mg total) by mouth 3 (three) times daily., Disp: 90 tablet, Rfl: 1   tadalafil  (CIALIS ) 20 MG tablet, Take 1 tablet (20 mg total) by mouth daily  as needed for erectile dysfunction. (Patient not taking: Reported on 04/09/2024), Disp: 30 tablet, Rfl: 1   venlafaxine  XR (EFFEXOR -XR) 150 MG 24 hr capsule, Take 2 capsules (300 mg total) by mouth daily with breakfast., Disp: 180 capsule, Rfl: 3   vitamin B-12 (CYANOCOBALAMIN ) 500 MCG tablet, Take 500 mcg by mouth daily., Disp: , Rfl:    vitamin E  400 UNIT capsule, Take 400 Units by mouth daily., Disp: , Rfl:    warfarin (COUMADIN ) 5 MG tablet, TAKE 1 TO 1 & 1/2 (ONE & ONE-[name]) TABLETS BY MOUTH ONCE DAILY AS DIRECTED BY ANTI-COAG CLINIC, Disp: 100 tablet, Rfl: 0  Social History   Tobacco Use  Smoking Status Never   Passive exposure: Never  Smokeless Tobacco Never    Allergies  Allergen Reactions   Eliquis  [Apixaban ] Itching    Red spots and severe itching   Xarelto [Rivaroxaban] Hives    Reaction in 2013   Objective:  There were no vitals filed for this visit. There is no height or weight on file to calculate BMI. Constitutional Well developed. Well nourished.  Vascular Dorsalis pedis pulses palpable bilaterally. Posterior tibial pulses palpable bilaterally. Capillary refill normal to all digits.  No cyanosis or clubbing noted. Pedal hair growth normal.  Neurologic Normal speech. Oriented to person, place, and time. Epicritic sensation to light touch grossly present bilaterally.  Dermatologic Pain on palpation of the entire/total nail on 1st digit of the right No other open wounds. No skin lesions.  Orthopedic: Normal joint ROM without pain or crepitus bilaterally. No visible deformities. No bony tenderness.   Radiographs: None Assessment:   1. Nail dystrophy    Plan:  Patient was evaluated and treated and all questions answered.  Nail contusion/dystrophy hallux, right -Patient elects to proceed with minor surgery to remove entire toenail today. Consent reviewed and signed by patient. -Entire/total nail excised. See procedure note. -Educated on post-procedure  care including soaking. Written instructions provided and reviewed. -Patient to follow up in 2 weeks for nail check.  Procedure: Excision of entire/total nail with phenol matricectomy Location: Right 1st toe digit Anesthesia: Lidocaine  1% plain; 1.5 mL and Marcaine  0.5% plain; 1.5 mL, digital block. Skin Prep: Betadine. Dressing: Silvadene; telfa; dry, sterile, compression dressing. Technique: Following skin prep, the toe was exsanguinated and a tourniquet was secured at the base of the toe. The affected nail border was freed and excised.  Phenol matricectomy was performed in standard technique the tourniquet was then removed and sterile dressing applied. Disposition: Patient tolerated procedure well. Patient to return in 2 weeks for follow-up.   No  follow-ups on file.

## 2024-04-25 ENCOUNTER — Ambulatory Visit

## 2024-04-25 DIAGNOSIS — M21371 Foot drop, right foot: Secondary | ICD-10-CM | POA: Diagnosis not present

## 2024-04-25 DIAGNOSIS — M21372 Foot drop, left foot: Secondary | ICD-10-CM | POA: Diagnosis not present

## 2024-05-10 ENCOUNTER — Ambulatory Visit: Admitting: Neurosurgery

## 2024-05-14 ENCOUNTER — Encounter: Payer: Self-pay | Admitting: Neurosurgery

## 2024-05-15 ENCOUNTER — Encounter: Payer: Self-pay | Admitting: Neurology

## 2024-06-18 ENCOUNTER — Encounter: Payer: Self-pay | Admitting: Internal Medicine

## 2024-06-18 MED ORDER — LEVOTHYROXINE SODIUM 25 MCG PO TABS: 25.0000 ug | ORAL_TABLET | Freq: Every day | ORAL | 0 refills | Status: DC

## 2024-06-22 NOTE — Telephone Encounter (Signed)
 Verbal order for manufacturer change was given to pharmacy so they could fill his levothyroxine .

## 2024-06-22 NOTE — Telephone Encounter (Unsigned)
 Copied from CRM [id number]. Topic: Clinical - Prescription Issue >> Jun 22, 2024  2:35 PM Franky GRADE wrote: Reason for CRM: Clarita from [facility] is calling because patient is  currently there to pick up his levothyroxine  (SYNTHROID ) 25 MCG tablet [[id number]] but they don't have it in stock, they would like us  to change the manufacture to accored medication manufacturer because they do have that one in stock. >> Jun 22, 2024  2:44 PM Franky GRADE wrote: [facility] nurse triage they advised to call [facility]

## 2024-07-03 ENCOUNTER — Telehealth: Payer: Self-pay | Admitting: Internal Medicine

## 2024-07-03 ENCOUNTER — Encounter: Payer: Self-pay | Admitting: Internal Medicine

## 2024-07-03 ENCOUNTER — Ambulatory Visit (INDEPENDENT_AMBULATORY_CARE_PROVIDER_SITE_OTHER): Admitting: Internal Medicine

## 2024-07-03 VITALS — BP 134/88 | HR 109 | Ht 75.0 in | Wt 353.6 lb

## 2024-07-03 DIAGNOSIS — I420 Dilated cardiomyopathy: Secondary | ICD-10-CM | POA: Diagnosis not present

## 2024-07-03 DIAGNOSIS — Z79899 Other long term (current) drug therapy: Secondary | ICD-10-CM

## 2024-07-03 DIAGNOSIS — E039 Hypothyroidism, unspecified: Secondary | ICD-10-CM | POA: Diagnosis not present

## 2024-07-03 DIAGNOSIS — E7849 Other hyperlipidemia: Secondary | ICD-10-CM | POA: Diagnosis not present

## 2024-07-03 DIAGNOSIS — G894 Chronic pain syndrome: Secondary | ICD-10-CM | POA: Diagnosis not present

## 2024-07-03 DIAGNOSIS — I10 Essential (primary) hypertension: Secondary | ICD-10-CM | POA: Diagnosis not present

## 2024-07-03 DIAGNOSIS — M5416 Radiculopathy, lumbar region: Secondary | ICD-10-CM

## 2024-07-03 DIAGNOSIS — I4819 Other persistent atrial fibrillation: Secondary | ICD-10-CM | POA: Diagnosis not present

## 2024-07-03 DIAGNOSIS — R7303 Prediabetes: Secondary | ICD-10-CM | POA: Diagnosis not present

## 2024-07-03 DIAGNOSIS — F331 Major depressive disorder, recurrent, moderate: Secondary | ICD-10-CM

## 2024-07-03 MED ORDER — METOPROLOL SUCCINATE ER 50 MG PO TB24: ORAL_TABLET | ORAL | 1 refills | Status: DC

## 2024-07-03 MED ORDER — LEVOTHYROXINE SODIUM 25 MCG PO TABS: 25.0000 ug | ORAL_TABLET | Freq: Every day | ORAL | 1 refills | Status: DC

## 2024-07-03 MED ORDER — HYDROCODONE-ACETAMINOPHEN 10-325 MG PO TABS: 1.0000 | ORAL_TABLET | Freq: Three times a day (TID) | ORAL | 0 refills | Status: DC | PRN

## 2024-07-03 MED ORDER — ROSUVASTATIN CALCIUM 10 MG PO TABS: ORAL_TABLET | ORAL | 1 refills | Status: DC

## 2024-07-03 NOTE — Assessment & Plan Note (Addendum)
 Persistent, Rate controlled with metoprolol  ; embolic stroke risk managed with warfarin

## 2024-07-03 NOTE — Assessment & Plan Note (Signed)
 Patient's  chronic pain is managed with hydrocodone  every 8 hours  qty #90/month.  NSAIDS are contraindicated due to chornic anticoagulation with warfarin. Refill history confirmed via [place] Controlled Substance databas, accessed by me today.. refills for Sept oct and Nov 2025  sent to pharmacy

## 2024-07-03 NOTE — Assessment & Plan Note (Signed)
Secondary to Diastolic dysfunction with LVH and left atrial dilation , mildly reduced systolic function  (40 to 45%) by 2016 ECHO   Complicated by untreated OSA

## 2024-07-03 NOTE — Telephone Encounter (Signed)
 Copied from CRM #[id number]. Topic: Clinical - Prescription Issue >> Jul 03, 2024  3:03 PM Mesmerise C wrote: Reason for CRM: Rosina from [facility] stated the prescriptions for HYDROcodone -acetaminophen  (NORCO) 10-325 MG tablet 3 were sent in and 2 didn't have an early fill date and will need to be resent with a early fill date

## 2024-07-03 NOTE — Assessment & Plan Note (Addendum)
 Weight loss was achieved from Jan to Sept 2021 with Ozempic  which was obtained through  [facility] but is no longer available or affoedable as [facility].    He lost 50 lbs with the medication and has regained  11 lbs  .

## 2024-07-03 NOTE — Assessment & Plan Note (Signed)
 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  He dscussed with patient though I do feel that there is still significant weakness of the feet.  Patient stated that the foot drop had been going on longer than the back.  Patient deferred any changes and was given AFO to address foot drop.   Encouraged patient to continue to work on the balance and coordination in the aquatic therapy to help him decrease the amount of frequent falls. He is still awaiting shoes that will accommodate the AFOs he has been prescribed  abd was

## 2024-07-03 NOTE — Assessment & Plan Note (Signed)
 Increased worry regarding his wife's health issues have been cited as reasons for his depressed mood.  He  feels better wth the increase in Effexor  dose to 300 mg,  along  with  300 mg wellbutrin .

## 2024-07-03 NOTE — Progress Notes (Unsigned)
 Subjective:  Patient ID: Johnathan [name], male    DOB: 19-Nov-1955  Age: 68 y.o. MRN: [id number]  CC: The primary encounter diagnosis was Essential hypertension. Diagnoses of Acquired hypothyroidism, Long-term use of high-risk medication, Other hyperlipidemia, Prediabetes, Persistent atrial fibrillation (HCC), Morbid obesity (HCC), Moderate episode of recurrent major depressive disorder (HCC), Chronic pain disorder, Dilated cardiomyopathy (HCC), and Lumbar radiculopathy were also pertinent to this visit.   HPI Johnathan [name] presents for  Chief Complaint  Patient presents with   Medical Management of Chronic Issues    3 month follow up    Still falling,  one fall in the chicken coop  Spinal stenosis:  notices   that legs become weak after 20 minutes of standing. [name] He has received the AFO's but waiting for the right shoe which is on order ([employer]) .  He has deferred PT and is doing  less than 30 minutes daily of leg exercises.  Plans to use the [employer] pool now that school kids are gone from the pool.     Outpatient Medications Prior to Visit  Medication Sig Dispense Refill   amiodarone  (PACERONE ) 200 MG tablet Take 1 tablet by mouth twice daily 180 tablet 3   B Complex Vitamins (B COMPLEX 50 PO) Take 1 tablet by mouth daily.      buPROPion  (WELLBUTRIN  XL) 300 MG 24 hr tablet Take 1 tablet (300 mg total) by mouth daily. 90 tablet 3   Cholecalciferol  (VITAMIN D3) 2000 UNITS TABS Take 2,000 Units by mouth daily.     docusate sodium  (COLACE) 100 MG capsule Take 400 mg by mouth daily.      Ferrous Gluconate  324 (37.5 Fe) MG TABS Take 1 tablet by mouth daily.      furosemide  (LASIX ) 40 MG tablet Take 1 tablet (40 mg total) by mouth daily as needed. 90 tablet 0   gabapentin  (NEURONTIN ) 300 MG capsule TAKE 1 CAPSULE BY MOUTH THREE TIMES A DAY (Patient taking differently: Take 300 mg by mouth 2 (two) times daily. TAKE 1 CAPSULE BY MOUTH TWO TIMES A DAY) 270 capsule 3   GLUCOSAMINE HCL-MSM  PO Take 1 tablet by mouth daily.     hydrochlorothiazide  (HYDRODIURIL ) 25 MG tablet Take 1 tablet (25 mg total) by mouth daily. 90 tablet 3   losartan  (COZAAR ) 100 MG tablet Take 1 tablet (100 mg total) by mouth daily. 90 tablet 3   Multiple Vitamin (MULTIVITAMIN) tablet Take 1 tablet by mouth daily.     Potassium Chloride  ER 20 MEQ TBCR Take 20 mEq by mouth 2 (two) times daily as needed. 180 tablet 3   propranolol  (INDERAL ) 20 MG tablet Take 1 tablet (20 mg total) by mouth 3 (three) times daily as needed. Reported on 03/22/2016 270 tablet 3   sildenafil  (REVATIO ) 20 MG tablet Take 1 tablet (20 mg total) by mouth 3 (three) times daily. 90 tablet 1   tadalafil  (CIALIS ) 20 MG tablet Take 1 tablet (20 mg total) by mouth daily as needed for erectile dysfunction. (Patient not taking: Reported on 04/09/2024) 30 tablet 1   venlafaxine  XR (EFFEXOR -XR) 150 MG 24 hr capsule Take 2 capsules (300 mg total) by mouth daily with breakfast. 180 capsule 3   vitamin B-12 (CYANOCOBALAMIN ) 500 MCG tablet Take 500 mcg by mouth daily.     vitamin E  400 UNIT capsule Take 400 Units by mouth daily.     warfarin (COUMADIN ) 5 MG tablet TAKE 1 TO 1 & 1/2 (ONE &  ONE-[name]) TABLETS BY MOUTH ONCE DAILY AS DIRECTED BY ANTI-COAG CLINIC 100 tablet 0   HYDROcodone -acetaminophen  (NORCO) 10-325 MG tablet Take 1 tablet by mouth every 8 (eight) hours as needed. 90 tablet 0   levothyroxine  (SYNTHROID ) 25 MCG tablet Take 1 tablet (25 mcg total) by mouth daily before breakfast. 90 tablet 0   metoprolol  succinate (TOPROL -XL) 50 MG 24 hr tablet TAKE 1 TABLET BY MOUTH DAILY. 90 tablet 1   rosuvastatin  (CRESTOR ) 10 MG tablet TAKE 1 TABLET BY MOUTH ONCE DAILY AT  2PM 90 tablet 0   No facility-administered medications prior to visit.    Review of Systems;  Patient denies headache, fevers, malaise, unintentional weight loss, skin rash, eye pain, sinus congestion and sinus pain, sore throat, dysphagia,  hemoptysis , cough, dyspnea, wheezing,  chest pain, palpitations, orthopnea, edema, abdominal pain, nausea, melena, diarrhea, constipation, flank pain, dysuria, hematuria, urinary  Frequency, nocturia, numbness, tingling, seizures,  Focal weakness, Loss of consciousness,  Tremor, insomnia, depression, anxiety, and suicidal ideation.      Objective:  BP 134/88   Pulse (!) 109   Ht 6' 3 (1.905 m)   Wt (!) 353 lb 9.6 oz (160.4 kg)   SpO2 97%   BMI 44.20 kg/m   BP Readings from Last 3 Encounters:  07/03/24 134/88  04/09/24 126/74  03/29/24 120/72    Wt Readings from Last 3 Encounters:  07/03/24 (!) 353 lb 9.6 oz (160.4 kg)  04/09/24 (!) 351 lb (159.2 kg)  03/29/24 (!) 350 lb 3.2 oz (158.8 kg)    Physical Exam Vitals reviewed.  Constitutional:      General: He is not in acute distress.    Appearance: Normal appearance. He is obese. He is not ill-appearing, toxic-appearing or diaphoretic.  HENT:     Head: Normocephalic.  Eyes:     General: No scleral icterus.       Right eye: No discharge.        Left eye: No discharge.     Conjunctiva/sclera: Conjunctivae normal.  Cardiovascular:     Rate and Rhythm: Normal rate. Rhythm irregular.     Heart sounds: Normal heart sounds.  Pulmonary:     Effort: Pulmonary effort is normal. No respiratory distress.     Breath sounds: Normal breath sounds.  Musculoskeletal:        General: Normal range of motion.     Cervical back: Normal range of motion.  Skin:    General: Skin is warm and dry.  Neurological:     General: No focal deficit present.     Mental Status: He is alert and oriented to person, place, and time. Mental status is at baseline.  Psychiatric:        Mood and Affect: Mood normal.        Behavior: Behavior normal.        Thought Content: Thought content normal.        Judgment: Judgment normal.     Lab Results  Component Value Date   HGBA1C 5.6 12/22/2022   HGBA1C 5.8 09/15/2022   HGBA1C 5.4 03/19/2022    Lab Results  Component Value Date    CREATININE 1.07 03/27/2024   CREATININE 1.07 06/28/2023   CREATININE 1.11 03/28/2023    Lab Results  Component Value Date   WBC 7.3 03/28/2023   HGB 13.7 03/28/2023   HCT 42.0 03/28/2023   PLT 165.0 03/28/2023   GLUCOSE 85 03/27/2024   CHOL 118 03/27/2024   TRIG 103 03/27/2024  HDL 49 03/27/2024   LDLDIRECT 54.0 09/15/2022   LDLCALC 50 03/27/2024   ALT 20 03/27/2024   AST 19 03/27/2024   NA 143 03/27/2024   K 4.4 03/27/2024   CL 107 03/27/2024   CREATININE 1.07 03/27/2024   BUN 17 03/27/2024   CO2 28 03/27/2024   TSH 4.53 06/28/2023   PSA 0.79 03/27/2024   INR 1.3 (A) 03/21/2024   HGBA1C 5.6 12/22/2022    MR Lumbar Spine Wo Contrast Result Date: 01/28/2024 CLINICAL DATA:  68 year old male with low back pain, multiple falls, decreased strength in both legs, right foot drop. EXAM: MRI LUMBAR SPINE WITHOUT CONTRAST TECHNIQUE: Multiplanar, multisequence MR imaging of the lumbar spine was performed. No intravenous contrast was administered. COMPARISON:  Lumbar radiographs 04/03/2021. FINDINGS: Segmentation:  Normal on the comparison. Alignment: Straightening but improved lumbar lordosis compared to the prior radiographs. Chronic mild degenerative appearing retrolisthesis of L5 on S1. Vertebrae: Bulky and widespread degenerative endplate spurring with chronic associated degenerative endplate marrow signal changes. There is patchy mild marrow edema inferiorly in the L2 body (series 6 image 12). Superimposed L3 inferior endplate Schmorl's node, L4 probable benign vertebral hemangioma, and other benign hemangiomas occasionally noted in the lower thoracic spine and sacrum (no follow-up imaging recommended). Intact visible sacrum and SI joints. No other acute osseous abnormality. Conus medullaris and cauda equina: Conus extends to the L1 level. No lower spinal cord or conus signal abnormality. Paraspinal and other soft tissues: Negative. Disc levels: T11-T12: Partially visible disc osteophyte  complex and facet hypertrophy. No definite spinal stenosis. Partially visible T11 foraminal stenosis which may be greater on the left. T12-L1: Disc space loss. Circumferential disc osteophyte complex. Mild facet and ligament flavum hypertrophy. Spinal stenosis with mild spinal cord mass effect here just above the conus (series 5, image 12). Mild bilateral T12 foraminal stenosis. L1-L2: Circumferential disc osteophyte complex. Moderate facet and ligament flavum hypertrophy. Epidural lipomatosis. Mild to moderate spinal stenosis here at the tip of the conus. Mild if any conus mass effect. Asymmetric bulky foraminal disc. Moderate to severe bilateral L1 foraminal stenosis. L2-L3: Severe disc space loss with evidence of vacuum disc. Bulky circumferential disc osteophyte complex asymmetric to the right. Moderate ligament flavum, mild facet hypertrophy. Epidural lipomatosis. Moderate to severe spinal and right greater than left lateral recess stenosis (L3 nerve level series 9, image 16). Moderate left and moderate to severe right L2 foraminal stenosis. L3-L4: Disc space loss with bulky circumferential disc osteophyte complex. Mild to moderate facet and ligament flavum hypertrophy. Regression of epidural lipomatosis at this level. Mild spinal stenosis. Moderate to severe bilateral L3 foraminal stenosis. L4-L5: Severe disc space loss. Vacuum disc. Bulky circumferential disc osteophyte complex with a broad-based posterior component in the midline. Moderate facet and ligament flavum hypertrophy. Mild epidural lipomatosis. Moderate spinal stenosis. Moderate to severe lateral recess stenosis greater on the right (right L5 nerve level series 9, image 25). Moderate to severe right greater than left L4 foraminal stenosis. L5-S1: Mild retrolisthesis. Disc space loss. Bulky circumferential disc osteophyte complex. Mild facet and ligament flavum hypertrophy. No spinal stenosis. Mild lateral recess stenosis primarily on the right.  Moderate to severe bilateral L5 foraminal stenosis appears greater on the left. IMPRESSION: 1. Diffuse lumbar spine degeneration with bulky disc, endplate, posterior element degeneration. Some superimposed epidural lipomatosis. Isolated mild degenerative L2 vertebral body marrow edema. Widespread 2. Multifactorial spinal stenosis from T12-L1 through L4-L5, frequently Moderate and is Severe at L2-L3. Up to mild mass effect on the lower thoracic  spinal cord and the conus but, no associated signal abnormality. 3. Widespread associated moderate and severe lateral recess and neural foraminal stenosis, see details above. Electronically Signed   By: VEAR Hurst M.D.   On: 01/28/2024 07:56    Assessment & Plan:  .Essential hypertension -     Microalbumin / creatinine urine ratio -     Comprehensive metabolic panel with GFR  Acquired hypothyroidism -     TSH  Long-term use of high-risk medication -     CBC with Differential/Platelet  Other hyperlipidemia  Prediabetes -     Comprehensive metabolic panel with GFR -     Hemoglobin A1c  Persistent atrial fibrillation (HCC) Assessment & Plan: Persistent, Rate controlled with metoprolol  ; embolic stroke risk managed with warfarin    Morbid obesity (HCC) Assessment & Plan: Weight loss was achieved from Jan to Sept 2021 with Ozempic  which was obtained through  [facility] but is no longer available or affoedable as [facility].    He lost 50 lbs with the medication and has regained  11 lbs  .   Moderate episode of recurrent major depressive disorder [facility]) Assessment & Plan: Increased worry regarding his wife's health issues have been cited as reasons for his depressed mood.  He  feels better wth the increase in Effexor  dose to 300 mg,  along  with  300 mg wellbutrin .    Chronic pain disorder Assessment & Plan: Patient's  chronic pain is managed with hydrocodone  every 8 hours  qty #90/month.  NSAIDS are contraindicated due to chornic anticoagulation with  warfarin. Refill history confirmed via [place] Controlled Substance databas, accessed by me today.. 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  He dscussed with patient though I do feel that there is still significant weakness of the feet.  Patient stated that the foot drop had been going on longer than the back.  Patient deferred any changes and was given AFO to address foot drop.   Encouraged patient to continue to work on the balance and coordination in the aquatic therapy to help him decrease the amount of frequent falls. He is still awaiting shoes that will accommodate the AFOs he has been prescribed  abd was    Other orders -     Levothyroxine  Sodium; Take 1 tablet (25 mcg total) by mouth daily before breakfast.  Dispense: 90 tablet; Refill: 1 -     Metoprolol  Succinate ER; TAKE 1 TABLET BY MOUTH DAILY.  Dispense: 90 tablet; Refill: 1 -     Rosuvastatin  Calcium ; TAKE 1 TABLET BY MOUTH ONCE DAILY AT  2PM  Dispense: 90 tablet; Refill: 1 -     HYDROcodone -Acetaminophen ; Take 1 tablet by mouth every 8 (eight) hours as needed.  Dispense: 90 tablet; Refill: 0 -     HYDROcodone -Acetaminophen ; Take 1 tablet by mouth every 8 (eight) hours as needed.  Dispense: 90 tablet; Refill: 0 -     HYDROcodone -Acetaminophen ; Take 1 tablet by mouth every 8 (eight) hours as needed.  Dispense: 90 tablet; Refill: 0     I spent 34 minutes on the day of this face to face encounter reviewing patient's  most recent  visit with cardiology,  nephrology,  and neurology,  prior relevant surgical and non surgical procedures, recent  labs and imaging studies, counseling on weight management,  reviewing the  assessment and plan with patient, and post visit ordering and reviewing of  diagnostics and therapeutics with patient  .   Follow-up: Return in about 3 months (around 10/02/2024).   Verneita LITTIE Kettering, MD

## 2024-07-04 LAB — COMPREHENSIVE METABOLIC PANEL WITH GFR
ALT: 24 U/L (ref 0–53)
AST: 27 U/L (ref 0–37)
Albumin: 4 g/dL (ref 3.5–5.2)
Alkaline Phosphatase: 90 U/L (ref 39–117)
BUN: 26 mg/dL — ABNORMAL HIGH (ref 6–23)
CO2: 28 meq/L (ref 19–32)
Calcium: 8.8 mg/dL (ref 8.4–10.5)
Chloride: 107 meq/L (ref 96–112)
Creatinine, Ser: 1.17 mg/dL (ref 0.40–1.50)
GFR: 63.86 mL/min (ref >60.00)
Glucose, Bld: 102 mg/dL — ABNORMAL HIGH (ref 70–99)
Potassium: 4.4 meq/L (ref 3.5–5.1)
Sodium: 142 meq/L (ref 135–145)
Total Bilirubin: 0.7 mg/dL (ref 0.2–1.2)
Total Protein: 6.7 g/dL (ref 6.0–8.3)

## 2024-07-04 LAB — CBC WITH DIFFERENTIAL/PLATELET
Basophils Absolute: 0 K/uL (ref 0.0–0.1)
Basophils Relative: 0.6 % (ref 0.0–3.0)
Eosinophils Absolute: 0.1 K/uL (ref 0.0–0.7)
Eosinophils Relative: 1.6 % (ref 0.0–5.0)
HCT: 42.9 % (ref 39.0–52.0)
Hemoglobin: 14.3 g/dL (ref 13.0–17.0)
Lymphocytes Relative: 24.5 % (ref 12.0–46.0)
Lymphs Abs: 1.8 K/uL (ref 0.7–4.0)
MCHC: 33.4 g/dL (ref 30.0–36.0)
MCV: 92.2 fl (ref 78.0–100.0)
Monocytes Absolute: 0.7 K/uL (ref 0.1–1.0)
Monocytes Relative: 10 % (ref 3.0–12.0)
Neutro Abs: 4.7 K/uL (ref 1.4–7.7)
Neutrophils Relative %: 63.3 % (ref 43.0–77.0)
Platelets: 127 K/uL — ABNORMAL LOW (ref 150.0–400.0)
RBC: 4.66 Mil/uL (ref 4.22–5.81)
RDW: 14.3 % (ref 11.5–15.5)
WBC: 7.4 K/uL (ref 4.0–10.5)

## 2024-07-04 LAB — HEMOGLOBIN A1C: Hgb A1c MFr Bld: 6.4 % (ref 4.6–6.5)

## 2024-07-04 LAB — TSH: TSH: 3.23 u[IU]/mL (ref 0.35–5.50)

## 2024-07-04 NOTE — Telephone Encounter (Signed)
 noted

## 2024-07-05 ENCOUNTER — Ambulatory Visit: Payer: Self-pay | Admitting: Internal Medicine

## 2024-07-05 NOTE — Assessment & Plan Note (Signed)
 A1c has risen to 6.4 with weight gain

## 2024-07-18 ENCOUNTER — Telehealth: Payer: Self-pay

## 2024-07-18 NOTE — Telephone Encounter (Signed)
Lpm to schedule INR check 

## 2024-08-06 ENCOUNTER — Encounter: Payer: Self-pay | Admitting: Internal Medicine

## 2024-08-08 ENCOUNTER — Other Ambulatory Visit: Payer: Self-pay | Admitting: Internal Medicine

## 2024-08-08 NOTE — Telephone Encounter (Signed)
 Copied from CRM #[id number]. Topic: Clinical - Prescription Issue >> Aug 08, 2024 12:26 PM Rea ORN wrote: Reason for CRM: Pt wife called to speak with nurse regarding pt's rx for hydrocodone . Please call back pt back [phone]. Katharine stated the pharmacy said the rx needs a date on it.

## 2024-08-08 NOTE — Telephone Encounter (Unsigned)
 Copied from CRM [id number]. Topic: Clinical - Medication Refill >> Aug 08, 2024  4:47 PM Zebedee SAUNDERS wrote: Medication: HYDROcodone -acetaminophen  (NORCO) 10-325 MG tablet  Has the patient contacted their pharmacy? Yes (Agent: If no, request that the patient contact the pharmacy for the refill. If patient does not wish to contact the pharmacy document the reason why and proceed with request.) (Agent: If yes, when and what did the pharmacy advise?)Pharmacy need PCP approval  This is the patient's preferred pharmacy:  [facility] [address], [place] - [address] [place] [address] Phone: [phone] Fax: [phone]  Is this the correct pharmacy for this prescription? Yes If no, delete pharmacy and type the correct one.   Has the prescription been filled recently? Yes  Is the patient out of the medication? Yes  Has the patient been seen for an appointment in the last year OR does the patient have an upcoming appointment? Yes  Can we respond through MyChart? Yes  Agent: Please be advised that Rx refills may take up to 3 business days. We ask that you follow-up with your pharmacy.

## 2024-08-09 ENCOUNTER — Other Ambulatory Visit: Payer: Self-pay | Admitting: Internal Medicine

## 2024-08-09 NOTE — Telephone Encounter (Signed)
 The pharmacy will not fill the rx because each rx needs to have a fill date on them and the ones sent in all have the fill date of 07/03/2024.

## 2024-08-09 NOTE — Telephone Encounter (Signed)
 Spoke with pharmacist and she stated that it is because all 3 scripts says earliest fill date 07/03/2024. She stated that they need to have each the exact date on them. She stated with them having float pharmacist coming in they will not accept the wording do not fill less than 30 from previous refill. The rxs were deactivated and will need to be sent back in.

## 2024-08-09 NOTE — Telephone Encounter (Signed)
 Copied from CRM [id number]. Topic: Clinical - Medication Refill >> Aug 09, 2024  4:05 PM Leah C wrote: Medication: levothyroxine  (SYNTHROID ) 25 MCG tablet. Pharmacy stated to make sure dates are on the prescriptions.   Has the patient contacted their pharmacy? [facility] advised patient to contact.  (Agent: If no, request that the patient contact the pharmacy for the refill. If patient does not wish to contact the pharmacy document the reason why and proceed with request.) (Agent: If yes, when and what did the pharmacy advise?)  This is the patient's preferred pharmacy:  [facility] [address], [place] - [address] [place] [address] Phone: [phone] Fax: [phone]   Is this the correct pharmacy for this prescription? Yes If no, delete pharmacy and type the correct one.   Has the prescription been filled recently? Yes  Is the patient out of the medication? Patient is completely out of this.   Has the patient been seen for an appointment in the last year OR does the patient have an upcoming appointment? Yes  Can we respond through MyChart? Yes  Agent: Please be advised that Rx refills may take up to 3 business days. We ask that you follow-up with your pharmacy.

## 2024-08-10 MED ORDER — HYDROCODONE-ACETAMINOPHEN 10-325 MG PO TABS: 1.0000 | ORAL_TABLET | Freq: Three times a day (TID) | ORAL | 0 refills | Status: DC | PRN

## 2024-08-10 MED ORDER — LEVOTHYROXINE SODIUM 25 MCG PO TABS: 25.0000 ug | ORAL_TABLET | Freq: Every day | ORAL | 0 refills | Status: DC

## 2024-09-04 ENCOUNTER — Ambulatory Visit (INDEPENDENT_AMBULATORY_CARE_PROVIDER_SITE_OTHER): Admitting: *Deleted

## 2024-09-04 VITALS — Ht 75.0 in | Wt 336.0 lb

## 2024-09-04 DIAGNOSIS — Z0000 Encounter for general adult medical examination without abnormal findings: Secondary | ICD-10-CM | POA: Diagnosis not present

## 2024-09-04 NOTE — Patient Instructions (Signed)
 [name],  Thank you for taking the time for your Medicare Wellness Visit. I appreciate your continued commitment to your health goals. Please review the care plan we discussed, and feel free to reach out if I can assist you further.  Please note that Annual Wellness Visits do not include a physical exam. Some assessments may be limited, especially if the visit was conducted virtually. If needed, we may recommend an in-person follow-up with your provider.  Ongoing Care Seeing your primary care provider every 3 to 6 months helps us  monitor your health and provide consistent, personalized care.  Consider updating your vaccines and colorectal cancer screening.  Managing Pain Without Opioids Opioids are strong medicines used to treat moderate to severe pain. For some people, especially those who have long-term (chronic) pain, opioids may not be the best choice for pain management due to: Side effects like nausea, constipation, and sleepiness. The risk of addiction (opioid use disorder). The longer you take opioids, the greater your risk of addiction. Pain that lasts for more than 3 months is called chronic pain. Managing chronic pain usually requires more than one approach and is often provided by a team of health care providers working together (multidisciplinary approach). Pain management may be done at a pain management center or pain clinic. How to manage pain without the use of opioids Use non-opioid medicines Non-opioid medicines for pain may include: Over-the-counter or prescription non-steroidal anti-inflammatory drugs (NSAIDs). These may be the first medicines used for pain. They work well for muscle and bone pain, and they reduce swelling. Acetaminophen . This over-the-counter medicine may work well for milder pain but not swelling. Antidepressants. These may be used to treat chronic pain. A certain type of antidepressant (tricyclics) is often used. These medicines are given in lower  doses for pain than when used for depression. Anticonvulsants. These are usually used to treat seizures but may also reduce nerve (neuropathic) pain. Muscle relaxants. These relieve pain caused by sudden muscle tightening (spasms). You may also use a pain medicine that is applied to the skin as a patch, cream, or gel (topical analgesic), such as a numbing medicine. These may cause fewer side effects than medicines taken by mouth. Do certain therapies as directed Some therapies can help with pain management. They include: Physical therapy. You will do exercises to gain strength and flexibility. A physical therapist may teach you exercises to move and stretch parts of your body that are weak, stiff, or painful. You can learn these exercises at physical therapy visits and practice them at home. Physical therapy may also involve: Massage. Heat wraps or applying heat or cold to affected areas. Electrical signals that interrupt pain signals (transcutaneous electrical nerve stimulation, TENS). Weak lasers that reduce pain and swelling (low-level laser therapy). Signals from your body that help you learn to regulate pain (biofeedback). Occupational therapy. This helps you to learn ways to function at home and work with less pain. Recreational therapy. This involves trying new activities or hobbies, such as a physical activity or drawing. Mental health therapy, including: Cognitive behavioral therapy (CBT). This helps you learn coping skills for dealing with pain. Acceptance and commitment therapy (ACT) to change the way you think and react to pain. Relaxation therapies, including muscle relaxation exercises and mindfulness-based stress reduction. Pain management counseling. This may be individual, family, or group counseling.  Receive medical treatments Medical treatments for pain management include: Nerve block injections. These may include a pain blocker and anti-inflammatory medicines. You may have  injections:  Near the spine to relieve chronic back or neck pain. Into joints to relieve back or joint pain. Into nerve areas that supply a painful area to relieve body pain. Into muscles (trigger point injections) to relieve some painful muscle conditions. A medical device placed near your spine to help block pain signals and relieve nerve pain or chronic back pain (spinal cord stimulation device). Acupuncture. Follow these instructions at home Medicines Take over-the-counter and prescription medicines only as told by your health care provider. If you are taking pain medicine, ask your health care providers about possible side effects to watch out for. Do not drive or use heavy machinery while taking prescription opioid pain medicine. Lifestyle  Do not use drugs or alcohol to reduce pain. If you drink alcohol, limit how much you have to: 0-1 drink a day for women who are not pregnant. 0-2 drinks a day for men. Know how much alcohol is in a drink. In the [place] one drink equals one 12 oz bottle of beer (355 mL), one 5 oz glass of wine (148 mL), or one 1 oz glass of hard liquor (44 mL). Do not use any products that contain nicotine or tobacco. These products include cigarettes, chewing tobacco, and vaping devices, such as e-cigarettes. If you need help quitting, ask your health care provider. Eat a healthy diet and maintain a healthy weight. Poor diet and excess weight may make pain worse. Eat foods that are high in fiber. These include fresh fruits and vegetables, whole grains, and beans. Limit foods that are high in fat and processed sugars, such as fried and sweet foods. Exercise regularly. Exercise lowers stress and may help relieve pain. Ask your health care provider what activities and exercises are safe for you. If your health care provider approves, join an exercise class that combines movement and stress reduction. Examples include yoga and tai chi. Get enough sleep. Lack of sleep may  make pain worse. Lower stress as much as possible. Practice stress reduction techniques as told by your therapist. General instructions Work with all your pain management providers to find the treatments that work best for you. You are an important member of your pain management team. There are many things you can do to reduce pain on your own. Consider joining an online or in-person support group for people who have chronic pain. Keep all follow-up visits. This is important. Where to find more information You can find more information about managing pain without opioids from: American Academy of Pain Medicine: painmed.org Institute for Chronic Pain: instituteforchronicpain.org American Chronic Pain Association: theacpa.org Contact a health care provider if: You have side effects from pain medicine. Your pain gets worse or does not get better with treatments or home therapy. You are struggling with anxiety or depression. Summary Many types of pain can be managed without opioids. Chronic pain may respond better to pain management without opioids. Pain is best managed when you and a team of health care providers work together. Pain management without opioids may include non-opioid medicines, medical treatments, physical therapy, mental health therapy, and lifestyle changes. Tell your health care providers if your pain gets worse or is not being managed well enough. This information is not intended to replace advice given to you by your health care provider. Make sure you discuss any questions you have with your health care provider. Document Revised: 01/07/2021 Document Reviewed: 01/07/2021 Elsevier Patient Education  2024 [employer] Inc[employer]  Referrals If a referral was made during today's visit and you  haven't received any updates within two weeks, please contact the referred provider directly to check on the status.  Recommended Screenings:  Health Maintenance  Topic Date Due   Hepatitis C  Screening  Never done   Zoster (Shingles) Vaccine (1 of 2) Never done   COVID-19 Vaccine (3 - Pfizer risk series) 08/27/2020   Colon Cancer Screening  09/05/2022   Flu Shot  01/08/2025*   DTaP/Tdap/Td vaccine (3 - Td or Tdap) 03/09/2025   Medicare Annual Wellness Visit  09/04/2025   Pneumococcal Vaccine for age over 73  Completed   Meningitis B Vaccine  Aged Out  *Topic was postponed. The date shown is not the original due date.       09/04/2024   11:38 AM  Advanced Directives  Does Patient Have a Medical Advance Directive? Yes  Type of Estate Agent of [place];Living will  Does patient want to make changes to medical advance directive? No - Patient declined  Copy of Healthcare Power of Attorney in Chart? No - copy requested    Vision: Annual vision screenings are recommended for early detection of glaucoma, cataracts, and diabetic retinopathy. These exams can also reveal signs of chronic conditions such as diabetes and high blood pressure.  Dental: Annual dental screenings help detect early signs of oral cancer, gum disease, and other conditions linked to overall health, including heart disease and diabetes.  Please see the attached documents for additional preventive care recommendations.

## 2024-09-04 NOTE — Progress Notes (Signed)
 Chief Complaint  Patient presents with   Medicare Wellness     Subjective:   Johnathan [name] is a 68 y.o. male who presents for a Medicare Annual Wellness Visit.  Allergies (verified) Eliquis  [apixaban ] and Xarelto [rivaroxaban]   History: Past Medical History:  Diagnosis Date   Arthritis    a. chronic joint pain   B12 deficiency    Chronic systolic CHF (congestive heart failure) (HCC)    a. echo 2013: EF of 50-55%, normal right ventricular systolic pressure, normal left atrium; b. EF 40-45%, inadequate for LV wall motion, mild MR, LA severely dilated @ 52 mm, PASP nl   Complication of anesthesia    Dysrhythmia    Erythema migrans (Lyme disease) 04/08/2015   History of DVT (deep vein thrombosis) 2004   s/p bariatric surgery   History of stress test    a. there was no ST segment deviation noted during stress,    There is a small defect of moderate severity present in the apex location, suggestive of apical ischemia, intermediate risk, calculated EF 21% but visually appears to be 35-40%   Hypertension    Hypothyroidism    Neuropathy    Obesity    OSA (obstructive sleep apnea)    a. not compliant with CPAP   PAF (paroxysmal atrial fibrillation) (HCC)    a. CHADSVASc at least 2 (HTN and vascular disease); b. on Eliquis     Pre-diabetes    Past Surgical History:  Procedure Laterality Date   ARTHROSCOPIC REPAIR ACL Bilateral 1993   bilateral knee x2 on left and x2 on right   BARIATRIC SURGERY  2004   Rou [place] Y    BARIATRIC SURGERY     CHOLECYSTECTOMY  1982   ELECTROPHYSIOLOGIC STUDY N/A 06/20/2015   Procedure: CARDIOVERSION;  Surgeon: Evalene JINNY Lunger, MD;  Location: [facility];  Service: Cardiovascular;  Laterality: N/A;   GANGLION CYST EXCISION     KNEE ARTHROPLASTY Right 06/20/2017   Procedure: COMPUTER ASSISTED TOTAL KNEE ARTHROPLASTY;  Surgeon: Mardee Lynwood SQUIBB, MD;  Location: [facility];  Service: Orthopedics;  Laterality: Right;   MENISCUS DEBRIDEMENT Left 2011    Left knee   REPLACEMENT TOTAL KNEE Left 07/2013   REVERSE SHOULDER ARTHROPLASTY Right 12/30/2022   Procedure: REVERSE SHOULDER ARTHROPLASTY;  Surgeon: Dozier Soulier, MD;  Location: [facility];  Service: Orthopedics;  Laterality: Right;   TONSILLECTOMY     Family History  Problem Relation Age of Onset   Dementia Mother    Diabetes Father    Alzheimer's disease Father    Social History   Occupational History   Not on file  Tobacco Use   Smoking status: Never    Passive exposure: Never   Smokeless tobacco: Never  Vaping Use   Vaping status: Never Used  Substance and Sexual Activity   Alcohol use: Not Currently    Alcohol/week: 2.0 standard drinks of alcohol    Types: 2 Shots of liquor per week    Comment: Vodka and lemonade occasionally 2-3/week    Drug use: Yes    Comment: gummies delta A   Sexual activity: Yes    Partners: Female    Comment: Wife   Tobacco Counseling Counseling given: Not Answered  SDOH Screenings   Food Insecurity: No Food Insecurity (09/04/2024)  Housing: Low Risk  (09/04/2024)  Transportation Needs: No Transportation Needs (09/04/2024)  Utilities: Not At Risk (09/04/2024)  Alcohol Screen: Low Risk  (09/04/2024)  Depression (PHQ2-9): Low Risk  (09/04/2024)  Financial  Resource Strain: Low Risk  (09/04/2024)  Physical Activity: Insufficiently Active (09/04/2024)  Social Connections: Moderately Isolated (09/04/2024)  Stress: No Stress Concern Present (09/04/2024)  Tobacco Use: Low Risk  (09/04/2024)  Health Literacy: Adequate Health Literacy (09/04/2024)   See flowsheets for full screening details  Depression Screen Depression Screening Exception Documentation Depression Screening Exception:: Patient refusal  PHQ 2 & 9 Depression Scale- Over the past 2 weeks, how often have you been bothered by any of the following problems? Little interest or pleasure in doing things: 0 Feeling down, depressed, or hopeless (PHQ Adolescent also  includes...irritable): 0 PHQ-2 Total Score: 0 Trouble falling or staying asleep, or sleeping too much: 0 Feeling tired or having little energy: 1 Poor appetite or overeating (PHQ Adolescent also includes...weight loss): 0 Feeling bad about yourself - or that you are a failure or have let yourself or your family down: 1 Trouble concentrating on things, such as reading the newspaper or watching television (PHQ Adolescent also includes...like school work): 0 Moving or speaking so slowly that other people could have noticed. Or the opposite - being so fidgety or restless that you have been moving around a lot more than usual: 0 Thoughts that you would be better off dead, or of hurting yourself in some way: 0 PHQ-9 Total Score: 2 If you checked off any problems, how difficult have these problems made it for you to do your work, take care of things at home, or get along with other people?: Somewhat difficult     Goals Addressed             This Visit's Progress    Patient Stated       Wants to lose 40 pounds       Visit info / Clinical Intake: Medicare Wellness Visit Type:: Subsequent Annual Wellness Visit Persons participating in visit:: patient Medicare Wellness Visit Mode:: Telephone If telephone:: video declined Because this visit was a virtual/telehealth visit:: pt reported vitals If Telephone or Video please confirm:: I connected with the patient using audio enabled telemedicine application and verified that I am speaking with the correct person using two identifiers; I discussed the limitations of evaluation and management by telemedicine; The patient expressed understanding and agreed to proceed Patient Location:: Home Provider Location:: Office/Home Information given by:: patient Interpreter Needed?: No Pre-visit prep was completed: yes AWV questionnaire completed by patient prior to visit?: no Living arrangements:: lives with spouse/significant other Patient's Overall  Health Status Rating: (!) fair Typical amount of pain: (!) a lot Does pain affect daily life?: (!) yes Are you currently prescribed opioids?: (!) yes  Dietary Habits and Nutritional Risks How many meals a day?: 2 Eats fruit and vegetables daily?: yes Most meals are obtained by: eating out In the last 2 weeks, have you had any of the following?: none Diabetic:: no  Functional Status Activities of Daily Living (to include ambulation/medication): Independent Ambulation: Independent with device- listed below Home Assistive Devices/Equipment: Walker (specify Type) Medication Administration: Independent Home Management: Independent Manage your own finances?: yes Primary transportation is: driving Concerns about vision?: no *vision screening is required for WTM* Concerns about hearing?: no  Fall Screening Falls in the past year?: 1 Number of falls in past year: 1 Was there an injury with Fall?: 0 Fall Risk Category Calculator: 2 Patient Fall Risk Level: Moderate Fall Risk  Fall Risk Patient at Risk for Falls Due to: History of fall(s); Impaired mobility Fall risk Follow up: Falls evaluation completed; Falls prevention discussed  Home  and Transportation Safety: All rugs have non-skid backing?: yes All stairs or steps have railings?: yes (has a ramp in the back) Grab bars in the bathtub or shower?: yes Have non-skid surface in bathtub or shower?: yes Good home lighting?: yes Regular seat belt use?: yes Hospital stays in the last year:: no  Cognitive Assessment Difficulty concentrating, remembering, or making decisions? : yes Will 6CIT or Mini Cog be Completed: yes What year is it?: 0 points What month is it?: 0 points Give patient an address phrase to remember (5 components): [address] [place] About what time is it?: 0 points Count backwards from 20 to 1: 0 points Say the months of the year in reverse: 0 points Repeat the address phrase from earlier: 0 points 6  CIT Score: 0 points  Advance Directives (For Healthcare) Does Patient Have a Medical Advance Directive?: Yes Does patient want to make changes to medical advance directive?: No - Patient declined Type of Advance Directive: Healthcare Power of [place]; Living will Copy of Healthcare Power of Attorney in Chart?: No - copy requested Copy of Living Will in Chart?: No - copy requested  Reviewed/Updated  Reviewed/Updated: Reviewed All (Medical, Surgical, Family, Medications, Allergies, Care Teams, Patient Goals)        Objective:    Today's Vitals   09/04/24 1133  Weight: (!) 336 lb (152.4 kg)  Height: 6' 3 (1.905 m)   Body mass index is 42 kg/m.  Current Medications (verified) Outpatient Encounter Medications as of 09/04/2024  Medication Sig   amiodarone  (PACERONE ) 200 MG tablet Take 1 tablet by mouth twice daily   B Complex Vitamins (B COMPLEX 50 PO) Take 1 tablet by mouth daily.    buPROPion  (WELLBUTRIN  XL) 300 MG 24 hr tablet Take 1 tablet (300 mg total) by mouth daily.   Cholecalciferol  (VITAMIN D3) 2000 UNITS TABS Take 2,000 Units by mouth daily.   docusate sodium  (COLACE) 100 MG capsule Take 400 mg by mouth daily.    Ferrous Gluconate  324 (37.5 Fe) MG TABS Take 1 tablet by mouth daily.    furosemide  (LASIX ) 40 MG tablet Take 1 tablet (40 mg total) by mouth daily as needed.   gabapentin  (NEURONTIN ) 300 MG capsule TAKE 1 CAPSULE BY MOUTH THREE TIMES A DAY (Patient taking differently: Take 300 mg by mouth 2 (two) times daily. TAKE 1 CAPSULE BY MOUTH TWO TIMES A DAY)   hydrochlorothiazide  (HYDRODIURIL ) 25 MG tablet Take 1 tablet (25 mg total) by mouth daily.   HYDROcodone -acetaminophen  (NORCO) 10-325 MG tablet Take 1 tablet by mouth every 8 (eight) hours as needed.   [START ON 09/10/2024] HYDROcodone -acetaminophen  (NORCO) 10-325 MG tablet Take 1 tablet by mouth every 8 (eight) hours as needed.   HYDROcodone -acetaminophen  (NORCO) 10-325 MG tablet Take 1 tablet by mouth every 8  (eight) hours as needed.   levothyroxine  (SYNTHROID ) 25 MCG tablet Take 1 tablet (25 mcg total) by mouth daily before breakfast.   losartan  (COZAAR ) 100 MG tablet Take 1 tablet (100 mg total) by mouth daily.   metoprolol  succinate (TOPROL -XL) 50 MG 24 hr tablet TAKE 1 TABLET BY MOUTH DAILY.   Multiple Vitamin (MULTIVITAMIN) tablet Take 1 tablet by mouth daily.   Potassium Chloride  ER 20 MEQ TBCR Take 20 mEq by mouth 2 (two) times daily as needed.   propranolol  (INDERAL ) 20 MG tablet Take 1 tablet (20 mg total) by mouth 3 (three) times daily as needed. Reported on 03/22/2016   rosuvastatin  (CRESTOR ) 10 MG tablet TAKE 1  TABLET BY MOUTH ONCE DAILY AT  2PM   venlafaxine  XR (EFFEXOR -XR) 150 MG 24 hr capsule Take 2 capsules (300 mg total) by mouth daily with breakfast.   vitamin B-12 (CYANOCOBALAMIN ) 500 MCG tablet Take 500 mcg by mouth daily.   vitamin E  400 UNIT capsule Take 400 Units by mouth daily.   warfarin (COUMADIN ) 5 MG tablet TAKE 1 TO 1 & 1/2 (ONE & ONE-HALF) TABLETS BY MOUTH ONCE DAILY AS DIRECTED BY ANTI-COAG CLINIC   GLUCOSAMINE HCL-MSM PO Take 1 tablet by mouth daily. (Patient not taking: Reported on 09/04/2024)   sildenafil  (REVATIO ) 20 MG tablet Take 1 tablet (20 mg total) by mouth 3 (three) times daily. (Patient not taking: Reported on 09/04/2024)   tadalafil  (CIALIS ) 20 MG tablet Take 1 tablet (20 mg total) by mouth daily as needed for erectile dysfunction. (Patient not taking: Reported on 09/04/2024)   No facility-administered encounter medications on file as of 09/04/2024.   Hearing/Vision screen Hearing Screening - Comments:: No issues Vision Screening - Comments:: Glasses, [employer], up to date Immunizations and Health Maintenance Health Maintenance  Topic Date Due   Hepatitis C Screening  Never done   Zoster Vaccines- Shingrix (1 of 2) Never done   COVID-19 Vaccine (3 - Pfizer risk series) 08/27/2020   Colonoscopy  09/05/2022   Influenza Vaccine  01/08/2025  (Originally 05/11/2024)   DTaP/Tdap/Td (3 - Td or Tdap) 03/09/2025   Medicare Annual Wellness (AWV)  09/04/2025   Pneumococcal Vaccine: 50+ Years  Completed   Meningococcal B Vaccine  Aged Out        Assessment/Plan:  This is a routine wellness examination for Johnathan [name].  Patient Care Team: Marylynn Verneita CROME, MD as PCP - General (Internal Medicine) Perla Evalene PARAS, MD as PCP - Cardiology (Cardiology) Perla Evalene PARAS, MD as Consulting Physician (Cardiology) Dozier Agent, MD as Referring Physician (Orthopedic Surgery) Patel, Donika K, DO as Consulting Physician (Neurology)  I have personally reviewed and noted the following in the patient's chart:   Medical and social history Use of alcohol, tobacco or illicit drugs  Current medications and supplements including opioid prescriptions. Functional ability and status Nutritional status Physical activity Advanced directives List of other physicians Hospitalizations, surgeries, and ER visits in previous 12 months Vitals Screenings to include cognitive, depression, and falls Referrals and appointments  No orders of the defined types were placed in this encounter.  In addition, I have reviewed and discussed with patient certain preventive protocols, quality metrics, and best practice recommendations. A written personalized care plan for preventive services as well as general preventive health recommendations were provided to patient.   Angeline Fredericks, LPN   [id number]/[id number]   Return in 1 year (on 09/04/2025).  After Visit Summary: (MyChart) Due to this being a telephonic visit, the after visit summary with patients personalized plan was offered to patient via MyChart   Nurse Notes: Patient declines vaccines. Patient stated that he is okay to have Hepatis C screening with next lab work. Patient wants to discuss colorectal screenings with PCP at upcoming office visit.

## 2024-09-10 NOTE — Progress Notes (Signed)
 I connected with  Johnathan [name] on 09/04/24 by a audio enabled telemedicine application and verified that I am speaking with the correct person using two identifiers.  Patient Location: Home  Provider Location: Home Office  Persons Participating in Visit: Patient.  I discussed the limitations of evaluation and management by telemedicine. The patient expressed understanding and agreed to proceed.   Vital Signs: Because this visit was a virtual/telehealth visit, some criteria may be missing or patient reported. Any vitals not documented were not able to be obtained and vitals that have been documented are patient reported.

## 2024-10-01 ENCOUNTER — Telehealth: Payer: Self-pay

## 2024-10-01 NOTE — Telephone Encounter (Signed)
 Copied from CRM #[id number]. Topic: General - Other >> Oct 01, 2024  1:28 PM Shardie S wrote: Reason for CRM: Att: Harlene- Patient's spouse Marylen Counts, requesting a callback, she states patient has been in hospital since Thursday, 12/18.

## 2024-10-02 NOTE — Telephone Encounter (Signed)
 LMTCB

## 2024-10-02 NOTE — Telephone Encounter (Unsigned)
 Copied from CRM [id number]. Topic: General - Other >> Oct 02, 2024  9:57 AM Revonda D wrote: Reason for CRM: Pt's wife Katheryn is returning a missed call from Norene in regards to the pt. I informed her that Harlene is currently not available and would callback once available.

## 2024-10-02 NOTE — Telephone Encounter (Signed)
 Spoke with pt's wife and she wanted to give us  a heads up that pt is currently in the hospital at [facility]. He went in for what was thought to be a stroke however the doctor's said it was not a stroke but they aren't sure what it is. His calf was red, swollen, warm and he had a temp. They put him on antibiotics. He is currently on 4 different ones but the redness is sreading up his leg and has made it to his groin. Pt's wife is nervous because they have not been able to find out what is wrong with him yet but they do have some more test results that they should get back tomorrow so she hopefully that they will give the doctors some answers. I advised wife that I would let Dr. Marylynn know.

## 2024-10-04 NOTE — Discharge Summary (Signed)
 ------------------------------------------------------------------------------- Attestation signed by Jinny [name] Seta, MD at 10/08/24 1233 I saw and evaluated the patient, participating in the key portions of the service on the day of discharge.  I reviewed the residents note and agree with the discharge plans and disposition. I personally spent 45 minutes in discharge planning services. [name] Johnathan Jinny, MD  -------------------------------------------------------------------------------   Physician Discharge Summary [facility] 6 BT [facility] [address] [place] [address] Dept: [phone] Loc: [phone]   Identifying Information:  Johnathan [name] [birth year] [id number]  Primary Care Physician: Marylynn Verneita Kay, MD   Code Status: Full Code  Admit Date: 09/27/2024  Discharge Date: 10/04/2024  Discharge To: Home with Home Health and/or PT/OT  Discharge Service: [facility] - Infectious Disease Floor Team (MED MARLA GLENWOOD [place])   Discharge Attending Physician: [name] Jinny, MD  Discharge Diagnoses:  Principal Problem:   Cellulitis of extremity (POA: Yes) Active Problems:   Sepsis    (CMS-HCC) (POA: Yes)   Longstanding persistent atrial fibrillation    (CMS-HCC) (POA: Yes)   On warfarin for atrial fibrillation    (CMS-HCC) (POA: Not Applicable)   Toxic metabolic encephalopathy (POA: Yes)   Hypothyroidism (POA: Yes)   Hypertension (POA: Yes) Resolved Problems:   * No resolved hospital problems. *  Outpatient Provider Follow Up Issues:  [ ]  Assess BP. Had instructed pt to hold home losartan  and HCTZ until follow up due to BP 110s systolic. Also increased metoprolol  succinate from 50 mg to 100 mg due to inadequate rate control of his A Fib.  [ ]  Assess for continued improvement in cellulitis [ ]  Recheck INR in the next week as INR was supratherapeutic upon admission. Warfarin dosing not changed while inpatient.   Hospital Course:  Johnathan [name] is a 68 y.o. male  with PMHx of A Fib on warfarin, hypothyroidism, R knee arthroplasty and peripheral neuropathy who presented to the ED due to AMS and was found to have sepsis 2/2 RLE cellulitis. He exhibited slow improvement on 8 days of IV antibiotics before discharge.   Hospital course by problem:  RLE Cellulitis  Sepsis  Presented to ED with AMS, and was found to have fever, tachycardia, tachypnea, leukocytosis, as well as RLE erythema and swelling x 1 day.  XR of right tibia/fibula with diffuse soft tissue swelling and no underlying osseous abnormality to suggest OM. Pt reports he had a fall earlier in the week iso his peripheral neuropathy of unclear etiology which caused a cut on his R foot. Also has untreated onychomycosis which could have predisposed him to infection. Relevant exposure of a dog that will lick his legs. Blood cultures drawn on admission had no growth. He was initially treated with vancomycin & pip/tazo, and after pip/tazo was discontinued the erythema noticeably worsened with increased proximal spread up his thigh: thus, concern for gram negative or anaerobe as the cause of his cellulitis. However, the appearance of his cellulitis was also atypical, with bilateral eschar-like lesions that he reports have also newly onset in the past week. Dermatology consulted and performed biopsy to help determine etiology, but this had not resulted at time of discharge. Discharged with 10 more days of Augmentin.   Toxic Metabolic Encephalopathy 2/2 Infection Initially presented as a code stroke; last known well was the night prior to her admission. On the morning of admission, his spouse found him with slurred speech.  Neurology evaluated him in the ED, and given his waxing and waning, thought that it was not likely a  stroke and more likely encephalopathy in the setting of active infection.  CT head was normal.  On exam at time of admission, no focal neurologic deficits were appreciated and he had returned to his  mental baseline.  Afib with RVR, resolved  Persistent A-fib on Warfarin Has a longstanding history of persistent A-fib that is managed with amiodarone  and warfarin in the outpatient setting.  Prior to admission, his wife notes that his heart rate had been elevated to the 120s at home, and on arrival to the ED his heart rate peaked in the 130s. While he was no longer in RVR, he continued to be tachycardic at 90-110s, and rises to 130s with ambulation. Per chart review, it seems that his HR is often 90s-100s in the outpatient setting, even when he is well, which suggests inadequate rate control from metoprolol . His metoprolol  dose was increased from succinate 50 mg daily to succinate 100 mg daily. Amiodarone  and warfarin were continued. No dose adjustments were made to warfarin, but he had been supratherapeutic on admission so should receive close monitoring outpatient.   HTN Patient is typically on losartan  and HCTZ in addition to metoprolol  for HTN. He had softer BP throughout admission. In the setting of increasing metoprolol  as above, instructed patient to pause his home losartan  and HCTZ until he follows up with PCP on 10/09/2024.   The patient's hospital stay has been complicated by the following clinically significant conditions requiring additional evaluation and treatment or having a significant effect of this patient's care: - Thrombocytopenia POA requiring further investigation or monitor - Age related debility POA requiring additional resources: DME, PT, or OT   Touchbase with Outpatient Provider: Warm Handoff: Completed on 10/07/2024 by Richardson VEAR Sanes, MD  (Resident) via Epic Secure Chat  Procedures: biopsy: skin biopsy by dermatology. Aerobic/anaerobic culture too young to read at time of discharge.  ______________________________________________________________________ Discharge Medications:    Your Medication List     PAUSE taking these medications    hydroCHLOROthiazide  25  MG tablet Wait to take this until your doctor or other care provider tells you to start again. Commonly known as: HYDRODIURIL  Take 1 tablet (25 mg total) by mouth daily.   losartan  100 MG tablet Wait to take this until your doctor or other care provider tells you to start again. Commonly known as: COZAAR  Take 1 tablet (100 mg total) by mouth daily.       START taking these medications    amoxicillin-clavulanate 875-125 mg per tablet Commonly known as: AUGMENTIN Take 1 tablet by mouth two (2) times a day for 10 days.   ciclopirox 8 % solution Commonly known as: PENLAC Apply topically nightly. Apply over nail and surrounding skin. Apply daily over previous coat. After seven (7) days, may remove with alcohol and continue cycle.       CHANGE how you take these medications    metoPROLOL  succinate 100 MG 24 hr tablet Commonly known as: Toprol -XL Take 1 tablet (100 mg total) by mouth daily. Start taking on: October 05, 2024 What changed:  medication strength how much to take       CONTINUE taking these medications    amiodarone  200 MG tablet Commonly known as: PACERONE  Take 1 tablet (200 mg total) by mouth two (2) times a day.   buPROPion  300 MG 24 hr tablet Commonly known as: Wellbutrin  XL Take 1 tablet (300 mg total) by mouth daily.   gabapentin  300 MG capsule Commonly known as: NEURONTIN  Take 1 capsule (300  mg total) by mouth Three (3) times a day.   HYDROcodone -acetaminophen  10-325 mg per tablet Commonly known as: NORCO 10-325 Take 1 tablet by mouth every six (6) hours as needed for pain.   levothyroxine  25 MCG tablet Commonly known as: SYNTHROID  Take 1 tablet (25 mcg total) by mouth daily.   rosuvastatin  10 MG tablet Commonly known as: CRESTOR  Take 1 tablet (10 mg total) by mouth daily.   venlafaxine  150 MG 24 hr capsule Commonly known as: EFFEXOR -XR Take 1 capsule (150 mg total) by mouth daily.   warfarin 5 MG tablet Commonly known as:  JANTOVEN  Take by mouth daily with evening meal. 5 mg daily except 2.5mg  on Mon/Fridays        Allergies: Patient has no known allergies. ______________________________________________________________________ Pending Test Results: Pending Labs     Order Current Status   PINK EXTRA TUBE Collected (09/27/24 1530)   Extra Lab Tubes In process   Aerobic Culture Preliminary result       Most Recent Labs: All lab results last 24 hours -  Recent Results (from the past 24 hours)  Basic Metabolic Panel   Collection Time: 10/04/24  4:37 AM  Result Value Ref Range   Sodium 145 135 - 145 mmol/L   Potassium 4.2 3.4 - 4.8 mmol/L   Chloride 104 98 - 107 mmol/L   CO2 27.0 20.0 - 31.0 mmol/L   Anion Gap 14 5 - 14 mmol/L   BUN 13 9 - 23 mg/dL   Creatinine 8.91 9.26 - 1.18 mg/dL   BUN/Creatinine Ratio 12    eGFR CKD-EPI (2021) Male 74 >=60 mL/min/1.40m2   Glucose 79 70 - 179 mg/dL   Calcium  8.3 (L) 8.7 - 10.4 mg/dL  Magnesium  Level   Collection Time: 10/04/24  4:37 AM  Result Value Ref Range   Magnesium  2.0 1.6 - 2.6 mg/dL  PT-INR   Collection Time: 10/04/24  4:37 AM  Result Value Ref Range   PT 30.3 (H) 9.9 - 12.6 sec   INR 2.71   CBC w/ Differential   Collection Time: 10/04/24  4:37 AM  Result Value Ref Range   WBC 6.4 3.6 - 11.2 10*9/L   RBC 3.95 (L) 4.26 - 5.60 10*12/L   HGB 12.0 (L) 12.9 - 16.5 g/dL   HCT 63.8 (L) 60.9 - 51.9 %   MCV 91.6 77.6 - 95.7 fL   MCH 30.4 25.9 - 32.4 pg   MCHC 33.2 32.0 - 36.0 g/dL   RDW 85.6 87.7 - 84.7 %   MPV 8.9 6.8 - 10.7 fL   Platelet 217 150 - 450 10*9/L   Neutrophils % 62.0 %   Lymphocytes % 25.3 %   Monocytes % 9.5 %   Eosinophils % 2.4 %   Basophils % 0.8 %   Absolute Neutrophils 4.0 1.8 - 7.8 10*9/L   Absolute Lymphocytes 1.6 1.1 - 3.6 10*9/L   Absolute Monocytes 0.6 0.3 - 0.8 10*9/L   Absolute Eosinophils 0.2 0.0 - 0.5 10*9/L   Absolute Basophils 0.1 0.0 - 0.1 10*9/L  ECG 12 Lead   Collection Time: 10/04/24  1:24 PM   Result Value Ref Range   EKG Systolic BP  mmHg   EKG Diastolic BP  mmHg   EKG Ventricular Rate 83 BPM   EKG Atrial Rate  BPM   EKG P-R Interval  ms   EKG QRS Duration 178 ms   EKG Q-T Interval 454 ms   EKG QTC Calculation 533 ms   EKG  Calculated P Axis  degrees   EKG Calculated R Axis 8 degrees   EKG Calculated T Axis -3 degrees   QTC Fredericia 506 ms   Microbiology -  Microbiology Results (last day)     ** No results found for the last 24 hours. **       Relevant Studies/Radiology: ECG 12 Lead Result Date: 10/04/2024 ATRIAL FIBRILLATION RIGHT BUNDLE BRANCH BLOCK ABNORMAL ECG WHEN COMPARED WITH ECG OF 27-Sep-2024 14:56, VENT. RATE HAS DECREASED by  45 bpm  PVL Venous Duplex Lower Extremity Right Result Date: 09/28/2024  Peripheral Vascular Lab     [address]   [place], [place] [address]  PVL VENOUS DUPLEX LOWER EXTREMITY RIGHT Patient Demographics Pt. Name: Johnathan [name] [facility] Location: Emergency Department MRN:      [id number]         Sex:      M DOB:      1956/10/02           Age:      10 years  Study Information Authorizing         [id number] CHRISTELL DEL      Performed Time       09/27/2024 Provider Name       [facility]                                     7:44:26 PM Ordering Physician  Christell DEL Guard        Patient Location     [facility] Accession Number    [id number] UN       Technologist         Aleene Groom RVT Diagnosis:                               Assisting                                          Technologist Ordered Reason For Exam: erythema and warmth Indication: Swelling Erythema Protocol The major deep veins from the inguinal ligament to the ankle are assessed for compressibility and color and spectral Doppler flow characteristics on the requested limb. The assessed veins include common femoral vein, femoral vein in the thigh, popliteal vein, and intramuscular calf veins. The iliac vein is assessed indirectly using Doppler waveform analysis. The great saphenous vein is  assessed for compressibility at the saphenofemoral junction, and the small saphenous vein assessed for compressibility behind the knee. A contralateral PW Doppler waveform is obtained for comparison. Limitations: Poor ultrasound/tissue interface and body habitus.  Final Interpretation Right Abnormalities consistent with the sequela of a prior venous obstructive process, with findings that appear chronic/long standing in nature are identified in the proximal veins, calf veins, and intramuscular calf veins. Left There is no evidence of obstruction proximal to the inguinal ligament or in the common femoral vein.  Electronically signed by [id number] Oneil Phlegm MD on 09/28/2024 at 9:25:26 AM.  -------------------------------------------------------------------------------- Right Duplex Findings The right popliteal vein, posterior tibial vein and gastrocnemius vein is partially compressible and appears with striations, rigid with compression and brightly echogenic. Not visualized segments included the prox-mid PTVs and peroneal veins due to above limitations. All other veins visualized appear fully compressible and demonstrate appropriate Doppler characteristics.  Left  Duplex Findings CFV Doppler waveform appears appropriately phasic. 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 Not visualized segments included the prox-mid PTVs and peroneal veins due to above limitations. All other veins visualized appear fully compressible and demonstrate appropriate Doppler characteristics. Left Technical Summary No evidence of iliofemoral obstruction.   ECG 12 Lead Result Date: 09/27/2024 ATRIAL FIBRILLATION WITH RAPID VENTRICULAR RESPONSE INDETERMINATE AXIS RIGHT BUNDLE BRANCH BLOCK ABNORMAL ECG NO PREVIOUS ECGS AVAILABLE Confirmed by Haynes Pulling [phone]) on 09/27/2024 8:04:20 PM  XR Tibia Fibula Right Result Date: 09/27/2024 EXAM: XR TIBIA  FIBULA RIGHT DATE: 09/27/2024 6:08 PM ACCESSION: [id number] UN DICTATED: 09/27/2024 6:55 PM INTERPRETATION LOCATION: [facility] CLINICAL INDICATION: 68 years old Male with Erythema, warmth    COMPARISON: None. TECHNIQUE: AP and lateral views of the right tibia and fibula. FINDINGS: Status post total knee arthroplasty. Hardware is intact. No perihardware lucency. No acute fracture or malalignment. Moderate degenerative changes of the tibiotalar joint. Small knee joint effusion versus synovial thickening. Enthesopathic changes along the dorsal and plantar aspect of the calcaneus. Diffuse soft tissue swelling. Advanced vascular calcifications.   1.  Diffuse soft tissue swelling, which may reflect generalized edema or cellulitis, given provided clinical history. No underlying osseous abnormality to suggest osteomyelitis. 2.  Status post total knee arthroplasty without hardware complication.  XR Chest 1 view Portable Result Date: 09/27/2024 EXAM: XR CHEST PORTABLE ACCESSION: [id number] UN REPORT DATE: 09/27/2024 6:31 PM CLINICAL INDICATION: FEVER  TECHNIQUE: Single View AP Chest Radiograph. COMPARISON: None FINDINGS: Lungs are clear.  No pleural effusion or pneumothorax. Suggestion of cardiomegaly, accounting for AP technique. Normal mediastinal contour.   No evidence of airspace disease. Suggestion of cardiomegaly, accounting for AP technique.   CT Head Wo Contrast Result Date: 09/27/2024 EXAM: Computed tomography, head or brain without contrast material. ACCESSION: [id number] UN CLINICAL INDICATION: 68 years old Male with STROKE  COMPARISON: None TECHNIQUE: Axial CT images of the head from skull base to vertex without contrast. FINDINGS: There is no midline shift. No mass lesion. There is no evidence of acute infarct. Multiple scattered hypodensities in the periventricular and deep white matter, nonspecific, but commonly seen with small vessel ischemic changes. The sinuses are pneumatized. No  intracranial hemorrhage or skull fractures.   No acute intracranial abnormalities.   ______________________________________________________________________ Discharge Instructions:  Activity Instructions     Activity as tolerated         You were admitted to [facility] for sepsis and cellulitis of your left leg. You were treated with 8 days of IV antibiotics. As your leg is now showing improvement, you are ready for discharge on oral antibiotics. We are going to have you take Augmentin for 10 days.   While you were here, your heart rate was frequently elevated. It looks like your heart rate had also been elevated at recent outpatient appointments, so we increased your metoprolol  dose for better control of your A Fib.   We are not making any changes to your warfarin dosing, but you should have your INR rechecked in the next week because it was too high when you first came in to the hospital.   We are also starting you on a nail polish for toenail fungus, as working to control this will help reduce your risk of future foot infections.   Please carefully read and follow these instructions below upon your discharge:  1) Please take your medications as prescribed and note the changes listed on your discharge.  At future follow-up appointments, please be sure to take all of your medications with you so your provider can better guide your care.   2) Seek medical care with your primary care doctor or local Emergency Room or Urgent Care if you develop any changes in your mental status, worsening abdominal pain, fevers greater than 101.5, any unexplained/unrelieved shortness of breath, uncontrolled nausea and vomiting that keeps you from remaining hydrated or taking your medication, or any other concerning symptoms.   3) Please go to your follow-up appointments. Some of your follow-up appointments have been listed below. If you do not see an appointment listed below with your primary care doctor,  please call your doctor's office as soon as possible to schedule an appointment to be seen within 7-10 days of discharge.      Follow Up instructions and Outpatient Referrals    Ambulatory Referral to Home Health     Reason for referral: PTOT   Physician to follow patient's care: PCP   Disciplines requested:  Physical Therapy Occupational Therapy     Physical Therapy requested:  Strengthening exercises Evaluate and treat     Occupational Therapy Requested:  Strengthening exercises Evaluate and treat ADL or IADL training     Call MD for:  difficulty breathing, headache or visual disturbances     Call MD for:  persistent nausea or vomiting     Call MD for:  severe uncontrolled pain     Call MD for:  temperature >38.5 Celsius     Discharge instructions        ______________________________________________________________________ Discharge Day Services: BP 125/83   Pulse 92   Temp 36.7 C (98.1 F) (Oral)   Resp 18   Ht 190.5 cm (6' 3)   Wt (!) 156.3 kg (344 lb 9.3 oz)   SpO2 98%   BMI 43.07 kg/m   Pt seen on the day of discharge and determined appropriate for discharge.  Condition at Discharge: good  Length of Discharge: I spent greater than 30 mins in the discharge of this patient.

## 2024-10-09 ENCOUNTER — Telehealth: Payer: Self-pay | Admitting: Internal Medicine

## 2024-10-09 ENCOUNTER — Ambulatory Visit: Admitting: Neurology

## 2024-10-09 ENCOUNTER — Encounter: Payer: Self-pay | Admitting: Internal Medicine

## 2024-10-09 ENCOUNTER — Ambulatory Visit (INDEPENDENT_AMBULATORY_CARE_PROVIDER_SITE_OTHER): Admitting: Internal Medicine

## 2024-10-09 VITALS — BP 140/78 | HR 100 | Temp 98.6°F | Ht 75.0 in | Wt 347.4 lb

## 2024-10-09 DIAGNOSIS — A599 Trichomoniasis, unspecified: Secondary | ICD-10-CM

## 2024-10-09 DIAGNOSIS — M6281 Muscle weakness (generalized): Secondary | ICD-10-CM | POA: Diagnosis not present

## 2024-10-09 DIAGNOSIS — R296 Repeated falls: Secondary | ICD-10-CM

## 2024-10-09 DIAGNOSIS — M21371 Foot drop, right foot: Secondary | ICD-10-CM | POA: Diagnosis not present

## 2024-10-09 DIAGNOSIS — Z09 Encounter for follow-up examination after completed treatment for conditions other than malignant neoplasm: Secondary | ICD-10-CM

## 2024-10-09 DIAGNOSIS — L03115 Cellulitis of right lower limb: Secondary | ICD-10-CM

## 2024-10-09 MED ORDER — ROSUVASTATIN CALCIUM 10 MG PO TABS: ORAL_TABLET | ORAL | 1 refills | Status: AC

## 2024-10-09 MED ORDER — LEVOTHYROXINE SODIUM 25 MCG PO TABS: 25.0000 ug | ORAL_TABLET | Freq: Every day | ORAL | 0 refills | Status: AC

## 2024-10-09 MED ORDER — VENLAFAXINE HCL ER 150 MG PO CP24: 300.0000 mg | ORAL_CAPSULE | Freq: Every day | ORAL | 3 refills | Status: AC

## 2024-10-09 MED ORDER — METOPROLOL SUCCINATE ER 100 MG PO TB24: 100.0000 mg | ORAL_TABLET | Freq: Every day | ORAL | 2 refills | Status: DC

## 2024-10-09 NOTE — Patient Instructions (Addendum)
 START  TAKING A PROBIOTIC TONIGHT ! Taking an antibiotic can create an imbalance in the normal population of bacteria that live in the small intestine.  This imbalance can persist for 3 months.   Taking a probiotic may help prevent a serious antibiotic associated diarrhea  Called clostridium dificile colitis that occurs when the bacteria population is altered .    NOTIFY  ME ASAP ABOUT WHAT ANTIBIOTIC YOU ARE TAKING IN THE ADDITION TL THE  FLAGYL  Get the home health PT STARTED ASAP.  AFTER THEY FINISH YOU NEED TO CONTINUE PT WITH [facility] (THIS WAS ORDERED IN APRIL BY DR CLOIS)

## 2024-10-09 NOTE — Progress Notes (Unsigned)
 "  Subjective:  Patient ID: Dallas DELENA Half, male    DOB: [birth year]  Age: 68 y.o. MRN: [id number]  CC: There were no encounter diagnoses.   HPI ELUTERIO SEYMOUR presents for No chief complaint on file.  Admitted to [facility] on Dec 19 with sepsis,  cellulitis of RLE ,  metabolic encephalopathy and a fib with RVR .  Treated with vanc/zosyn. Blood cutlures negative but cellulitis progressed/spread to groin  during Zosyn suspension  so he remained in house for 6 days with 4 drug therapy .  He was  discharged , on Dec 25 with a 7 10 day course of  abx, and states that the  redness is receding and skin is less painful to touch.  Biopsies were done in  house   of several wounds. He NOW TAKING METRONIDAZOLE SINCE DEC 29  WHEN  WIFE'S UA GREW TRICHOMONAS and has not notified [facility].  Has not been mobile since discharge on Dec 26  .  THEY HAVE BEEN CONTACTED BY HOME HEALTH FOR PT BUT HAS DEFERRED BECAUSE OF A PREPLANNED VACATION TO [place]   BILATERAL FOOT DROP :  WAS REFERRED TO NEUROSURGERY  SAW YARBROUGH,  REFERRED FOR PT ,   sent [facility],  not wearing them.  Referred for  FOR EMG /  STUDIES , which have not been done.  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      buPROPion  (WELLBUTRIN  XL) 300 MG 24 hr tablet Take 1 tablet (300 mg total) by mouth daily. 90 tablet 3   Cholecalciferol  (VITAMIN D3) 2000 UNITS TABS Take 2,000 Units by mouth daily.     docusate sodium  (COLACE) 100 MG capsule Take 400 mg by mouth daily.      Ferrous Gluconate  324 (37.5 Fe) MG TABS Take 1 tablet by mouth daily.      furosemide  (LASIX ) 40  MG tablet Take 1 tablet (40 mg total) by mouth daily as needed. 90 tablet 0   gabapentin  (NEURONTIN ) 300 MG capsule TAKE 1 CAPSULE BY MOUTH THREE TIMES A DAY (Patient taking differently: Take 300 mg by mouth 2 (two) times daily. TAKE 1 CAPSULE BY MOUTH TWO TIMES A DAY) 270 capsule 3   GLUCOSAMINE HCL-MSM PO Take 1 tablet by mouth daily. (Patient not taking: Reported on 09/04/2024)     hydrochlorothiazide  (HYDRODIURIL ) 25 MG tablet Take 1 tablet (25 mg total) by mouth daily. 90 tablet 3   HYDROcodone -acetaminophen  (NORCO) 10-325 MG tablet Take 1 tablet by mouth every 8 (eight) hours as needed. 90 tablet 0   HYDROcodone -acetaminophen  (NORCO) 10-325 MG tablet Take 1 tablet by mouth every 8 (eight) hours as needed. 90 tablet 0   HYDROcodone -acetaminophen  (NORCO) 10-325 MG tablet Take 1 tablet by mouth every 8 (eight) hours as needed. 90 tablet 0   levothyroxine  (SYNTHROID ) 25 MCG tablet Take 1 tablet (25 mcg total) by mouth daily before breakfast. 90 tablet 0   losartan  (COZAAR ) 100 MG tablet Take 1 tablet (100 mg total) by mouth daily. 90 tablet 3   metoprolol  succinate (TOPROL -XL) 50  MG 24 hr tablet TAKE 1 TABLET BY MOUTH DAILY. 90 tablet 1   Multiple Vitamin (MULTIVITAMIN) tablet Take 1 tablet by mouth daily.     Potassium Chloride  ER 20 MEQ TBCR Take 20 mEq by mouth 2 (two) times daily as needed. 180 tablet 3   propranolol  (INDERAL ) 20 MG tablet Take 1 tablet (20 mg total) by mouth 3 (three) times daily as needed. Reported on 03/22/2016 270 tablet 3   rosuvastatin  (CRESTOR ) 10 MG tablet TAKE 1 TABLET BY MOUTH ONCE DAILY AT  2PM 90 tablet 1   sildenafil  (REVATIO ) 20 MG tablet Take 1 tablet (20 mg total) by mouth 3 (three) times daily. (Patient not taking: Reported on 09/04/2024) 90 tablet 1   tadalafil  (CIALIS ) 20 MG tablet Take 1 tablet (20 mg total) by mouth daily as needed for erectile dysfunction. (Patient not taking: Reported on 09/04/2024) 30 tablet 1   venlafaxine  XR (EFFEXOR -XR) 150 MG 24 hr  capsule Take 2 capsules (300 mg total) by mouth daily with breakfast. 180 capsule 3   vitamin B-12 (CYANOCOBALAMIN ) 500 MCG tablet Take 500 mcg by mouth daily.     vitamin E  400 UNIT capsule Take 400 Units by mouth daily.     warfarin (COUMADIN ) 5 MG tablet TAKE 1 TO 1 & 1/2 (ONE & ONE-HALF) TABLETS BY MOUTH ONCE DAILY AS DIRECTED BY ANTI-COAG CLINIC 100 tablet 0   No facility-administered medications prior to visit.    Review of Systems;  Patient denies headache, fevers, malaise, unintentional weight loss, skin rash, eye pain, sinus congestion and sinus pain, sore throat, dysphagia,  hemoptysis , cough, dyspnea, wheezing, chest pain, palpitations, orthopnea, edema, abdominal pain, nausea, melena, diarrhea, constipation, flank pain, dysuria, hematuria, urinary  Frequency, nocturia, numbness, tingling, seizures,  Focal weakness, Loss of consciousness,  Tremor, insomnia, depression, anxiety, and suicidal ideation.      Objective:  There were no vitals taken for this visit.  BP Readings from Last 3 Encounters:  07/03/24 134/88  04/09/24 126/74  03/29/24 120/72    Wt Readings from Last 3 Encounters:  09/04/24 (!) 336 lb (152.4 kg)  07/03/24 (!) 353 lb 9.6 oz (160.4 kg)  04/09/24 (!) 351 lb (159.2 kg)    Physical Exam  Lab Results  Component Value Date   HGBA1C 6.4 07/03/2024   HGBA1C 5.6 12/22/2022   HGBA1C 5.8 09/15/2022    Lab Results  Component Value Date   CREATININE 1.17 07/03/2024   CREATININE 1.07 03/27/2024   CREATININE 1.07 06/28/2023    Lab Results  Component Value Date   WBC 7.4 07/03/2024   HGB 14.3 07/03/2024   HCT 42.9 07/03/2024   PLT 127.0 (L) 07/03/2024   GLUCOSE 102 (H) 07/03/2024   CHOL 118 03/27/2024   TRIG 103 03/27/2024   HDL 49 03/27/2024   LDLDIRECT 54.0 09/15/2022   LDLCALC 50 03/27/2024   ALT 24 07/03/2024   AST 27 07/03/2024   NA 142 07/03/2024   K 4.4 07/03/2024   CL 107 07/03/2024   CREATININE 1.17 07/03/2024   BUN 26 (H)  07/03/2024   CO2 28 07/03/2024   TSH 3.23 07/03/2024   PSA 0.79 03/27/2024   INR 1.3 (A) 03/21/2024   HGBA1C 6.4 07/03/2024    MR Lumbar Spine Wo Contrast Result Date: 01/28/2024 CLINICAL DATA:  68 year old male with low back pain, multiple falls, decreased strength in both legs, right foot drop. EXAM: MRI LUMBAR SPINE WITHOUT CONTRAST TECHNIQUE: Multiplanar, multisequence MR imaging of the lumbar spine was  performed. No intravenous contrast was administered. COMPARISON:  Lumbar radiographs 04/03/2021. FINDINGS: Segmentation:  Normal on the comparison. Alignment: Straightening but improved lumbar lordosis compared to the prior radiographs. Chronic mild degenerative appearing retrolisthesis of L5 on S1. Vertebrae: Bulky and widespread degenerative endplate spurring with chronic associated degenerative endplate marrow signal changes. There is patchy mild marrow edema inferiorly in the L2 body (series 6 image 12). Superimposed L3 inferior endplate Schmorl's node, L4 probable benign vertebral hemangioma, and other benign hemangiomas occasionally noted in the lower thoracic spine and sacrum (no follow-up imaging recommended). Intact visible sacrum and SI joints. No other acute osseous abnormality. Conus medullaris and cauda equina: Conus extends to the L1 level. No lower spinal cord or conus signal abnormality. Paraspinal and other soft tissues: Negative. Disc levels: T11-T12: Partially visible disc osteophyte complex and facet hypertrophy. No definite spinal stenosis. Partially visible T11 foraminal stenosis which may be greater on the left. T12-L1: Disc space loss. Circumferential disc osteophyte complex. Mild facet and ligament flavum hypertrophy. Spinal stenosis with mild spinal cord mass effect here just above the conus (series 5, image 12). Mild bilateral T12 foraminal stenosis. L1-L2: Circumferential disc osteophyte complex. Moderate facet and ligament flavum hypertrophy. Epidural lipomatosis. Mild to  moderate spinal stenosis here at the tip of the conus. Mild if any conus mass effect. Asymmetric bulky foraminal disc. Moderate to severe bilateral L1 foraminal stenosis. L2-L3: Severe disc space loss with evidence of vacuum disc. Bulky circumferential disc osteophyte complex asymmetric to the right. Moderate ligament flavum, mild facet hypertrophy. Epidural lipomatosis. Moderate to severe spinal and right greater than left lateral recess stenosis (L3 nerve level series 9, image 16). Moderate left and moderate to severe right L2 foraminal stenosis. L3-L4: Disc space loss with bulky circumferential disc osteophyte complex. Mild to moderate facet and ligament flavum hypertrophy. Regression of epidural lipomatosis at this level. Mild spinal stenosis. Moderate to severe bilateral L3 foraminal stenosis. L4-L5: Severe disc space loss. Vacuum disc. Bulky circumferential disc osteophyte complex with a broad-based posterior component in the midline. Moderate facet and ligament flavum hypertrophy. Mild epidural lipomatosis. Moderate spinal stenosis. Moderate to severe lateral recess stenosis greater on the right (right L5 nerve level series 9, image 25). Moderate to severe right greater than left L4 foraminal stenosis. L5-S1: Mild retrolisthesis. Disc space loss. Bulky circumferential disc osteophyte complex. Mild facet and ligament flavum hypertrophy. No spinal stenosis. Mild lateral recess stenosis primarily on the right. Moderate to severe bilateral L5 foraminal stenosis appears greater on the left. IMPRESSION: 1. Diffuse lumbar spine degeneration with bulky disc, endplate, posterior element degeneration. Some superimposed epidural lipomatosis. Isolated mild degenerative L2 vertebral body marrow edema. Widespread 2. Multifactorial spinal stenosis from T12-L1 through L4-L5, frequently Moderate and is Severe at L2-L3. Up to mild mass effect on the lower thoracic spinal cord and the conus but, no associated signal  abnormality. 3. Widespread associated moderate and severe lateral recess and neural foraminal stenosis, see details above. Electronically Signed   By: VEAR Hurst M.D.   On: 01/28/2024 07:56    Assessment & Plan:  .There are no diagnoses linked to this encounter.   I spent 34 minutes on the day of this face to face encounter reviewing patient's  most recent visit with cardiology,  nephrology,  and neurology,  prior relevant surgical and non surgical procedures, recent  labs and imaging studies, counseling on weight management,  reviewing the assessment and plan with patient, and post visit ordering and reviewing of  diagnostics and therapeutics with  patient  .   Follow-up: No follow-ups on file.   Verneita LITTIE Kettering, MD "

## 2024-10-09 NOTE — Telephone Encounter (Signed)
 Pt's wife brought back in his AVS and info from recent [facility] visit, states as requested by Dr Marylynn. It is in the color folder up front

## 2024-10-10 ENCOUNTER — Other Ambulatory Visit: Payer: Self-pay

## 2024-10-10 ENCOUNTER — Other Ambulatory Visit: Payer: Self-pay | Admitting: Internal Medicine

## 2024-10-10 DIAGNOSIS — Z09 Encounter for follow-up examination after completed treatment for conditions other than malignant neoplasm: Secondary | ICD-10-CM | POA: Insufficient documentation

## 2024-10-10 DIAGNOSIS — L03115 Cellulitis of right lower limb: Secondary | ICD-10-CM | POA: Insufficient documentation

## 2024-10-10 DIAGNOSIS — A599 Trichomoniasis, unspecified: Secondary | ICD-10-CM | POA: Insufficient documentation

## 2024-10-10 NOTE — Telephone Encounter (Signed)
 Copied from CRM #[id number]. Topic: Clinical - Medication Refill >> Oct 10, 2024  2:58 PM [name] wrote: Medication: HYDROcodone -acetaminophen  (NORCO) 10-325 MG tablet  Has the patient contacted their pharmacy? Yes (Agent: If no, request that the patient contact the pharmacy for the refill. If patient does not wish to contact the pharmacy document the reason why and proceed with request.) (Agent: If yes, when and what did the pharmacy advise?)  This is the patient's preferred pharmacy:  [facility] [address], [place] - [name] [place] [address] Phone: [phone] Fax: [phone]    Is this the correct pharmacy for this prescription? Yes If no, delete pharmacy and type the correct one.   Has the prescription been filled recently? No  Is the patient out of the medication? No  Has the patient been seen for an appointment in the last year OR does the patient have an upcoming appointment? Yes  Can we respond through MyChart? Yes  Agent: Please be advised that Rx refills may take up to 3 business days. We ask that you follow-up with your pharmacy.

## 2024-10-10 NOTE — Assessment & Plan Note (Addendum)
 Secondary to multiple infected wounds. Continue  augmentin x 10 days,  started at discharge from [facility] on Dec 25

## 2024-10-10 NOTE — Assessment & Plan Note (Signed)
Patient is stable post discharge and has no new issues or questions about discharge plans at the visit today for hospital follow up. All labs , imaging studies and progress notes from admission were reviewed with patient today   

## 2024-10-10 NOTE — Telephone Encounter (Signed)
 Augmentin added and pt informed

## 2024-10-10 NOTE — Assessment & Plan Note (Signed)
 Secondary to leg weakness and right foot drop.  He has been referred to neurosurgery  neurology,  and a neuromuscular specialist.  Orthotics and leg braces have been  obtained but not used by patient.  Recommend he start home health PT now,  followed by outpatient PT..  he appears to be declining  in functional mobility and will likely qualify  for  a motorized scooter

## 2024-10-10 NOTE — Assessment & Plan Note (Signed)
 Presumed based on wife's infection.  Taking flagyl

## 2024-10-10 NOTE — Addendum Note (Signed)
 Addended by: ORLANDO KINGDOM on: 10/10/2024 10:51 AM   Modules accepted: Orders

## 2024-10-11 ENCOUNTER — Emergency Department

## 2024-10-11 ENCOUNTER — Observation Stay

## 2024-10-11 ENCOUNTER — Inpatient Hospital Stay
Admission: EM | Admit: 2024-10-11 | Discharge: 2024-10-16 | DRG: 372 | Disposition: A | Discharge status: Skilled Nursing Facility | Attending: Internal Medicine | Admitting: Internal Medicine

## 2024-10-11 ENCOUNTER — Other Ambulatory Visit: Payer: Self-pay

## 2024-10-11 DIAGNOSIS — Z86718 Personal history of other venous thrombosis and embolism: Secondary | ICD-10-CM

## 2024-10-11 DIAGNOSIS — I10 Essential (primary) hypertension: Secondary | ICD-10-CM | POA: Diagnosis present

## 2024-10-11 DIAGNOSIS — I110 Hypertensive heart disease with heart failure: Secondary | ICD-10-CM | POA: Diagnosis present

## 2024-10-11 DIAGNOSIS — R531 Weakness: Secondary | ICD-10-CM

## 2024-10-11 DIAGNOSIS — Z91148 Patient's other noncompliance with medication regimen for other reason: Secondary | ICD-10-CM

## 2024-10-11 DIAGNOSIS — I5042 Chronic combined systolic (congestive) and diastolic (congestive) heart failure: Secondary | ICD-10-CM | POA: Diagnosis present

## 2024-10-11 DIAGNOSIS — I4891 Unspecified atrial fibrillation: Secondary | ICD-10-CM

## 2024-10-11 DIAGNOSIS — I4819 Other persistent atrial fibrillation: Secondary | ICD-10-CM | POA: Diagnosis present

## 2024-10-11 DIAGNOSIS — E66813 Obesity, class 3: Secondary | ICD-10-CM | POA: Diagnosis present

## 2024-10-11 DIAGNOSIS — Z833 Family history of diabetes mellitus: Secondary | ICD-10-CM

## 2024-10-11 DIAGNOSIS — D649 Anemia, unspecified: Secondary | ICD-10-CM | POA: Diagnosis present

## 2024-10-11 DIAGNOSIS — I5022 Chronic systolic (congestive) heart failure: Secondary | ICD-10-CM

## 2024-10-11 DIAGNOSIS — Z96653 Presence of artificial knee joint, bilateral: Secondary | ICD-10-CM | POA: Diagnosis present

## 2024-10-11 DIAGNOSIS — Z9884 Bariatric surgery status: Secondary | ICD-10-CM

## 2024-10-11 DIAGNOSIS — M159 Polyosteoarthritis, unspecified: Secondary | ICD-10-CM | POA: Diagnosis present

## 2024-10-11 DIAGNOSIS — E8809 Other disorders of plasma-protein metabolism, not elsewhere classified: Secondary | ICD-10-CM | POA: Diagnosis present

## 2024-10-11 DIAGNOSIS — I482 Chronic atrial fibrillation, unspecified: Principal | ICD-10-CM | POA: Diagnosis present

## 2024-10-11 DIAGNOSIS — Z6841 Body Mass Index (BMI) 40.0 and over, adult: Secondary | ICD-10-CM

## 2024-10-11 DIAGNOSIS — E039 Hypothyroidism, unspecified: Secondary | ICD-10-CM | POA: Diagnosis present

## 2024-10-11 DIAGNOSIS — Z7901 Long term (current) use of anticoagulants: Secondary | ICD-10-CM

## 2024-10-11 DIAGNOSIS — W57XXXS Bitten or stung by nonvenomous insect and other nonvenomous arthropods, sequela: Secondary | ICD-10-CM

## 2024-10-11 DIAGNOSIS — I420 Dilated cardiomyopathy: Secondary | ICD-10-CM | POA: Diagnosis present

## 2024-10-11 DIAGNOSIS — Z91128 Patient's intentional underdosing of medication regimen for other reason: Secondary | ICD-10-CM

## 2024-10-11 DIAGNOSIS — E869 Volume depletion, unspecified: Secondary | ICD-10-CM | POA: Diagnosis present

## 2024-10-11 DIAGNOSIS — Z96611 Presence of right artificial shoulder joint: Secondary | ICD-10-CM | POA: Diagnosis present

## 2024-10-11 DIAGNOSIS — R296 Repeated falls: Secondary | ICD-10-CM | POA: Diagnosis present

## 2024-10-11 DIAGNOSIS — I5032 Chronic diastolic (congestive) heart failure: Secondary | ICD-10-CM

## 2024-10-11 DIAGNOSIS — A0472 Enterocolitis due to Clostridium difficile, not specified as recurrent: Principal | ICD-10-CM | POA: Diagnosis present

## 2024-10-11 DIAGNOSIS — M21371 Foot drop, right foot: Secondary | ICD-10-CM | POA: Diagnosis present

## 2024-10-11 DIAGNOSIS — Z79899 Other long term (current) drug therapy: Secondary | ICD-10-CM

## 2024-10-11 DIAGNOSIS — L03115 Cellulitis of right lower limb: Principal | ICD-10-CM | POA: Diagnosis present

## 2024-10-11 DIAGNOSIS — Z1152 Encounter for screening for COVID-19: Secondary | ICD-10-CM

## 2024-10-11 DIAGNOSIS — G8929 Other chronic pain: Secondary | ICD-10-CM | POA: Diagnosis present

## 2024-10-11 DIAGNOSIS — E876 Hypokalemia: Secondary | ICD-10-CM | POA: Diagnosis not present

## 2024-10-11 DIAGNOSIS — Z820 Family history of epilepsy and other diseases of the nervous system: Secondary | ICD-10-CM

## 2024-10-11 DIAGNOSIS — Z888 Allergy status to other drugs, medicaments and biological substances status: Secondary | ICD-10-CM

## 2024-10-11 DIAGNOSIS — Z9049 Acquired absence of other specified parts of digestive tract: Secondary | ICD-10-CM

## 2024-10-11 DIAGNOSIS — G4733 Obstructive sleep apnea (adult) (pediatric): Secondary | ICD-10-CM | POA: Diagnosis present

## 2024-10-11 DIAGNOSIS — T45516A Underdosing of anticoagulants, initial encounter: Secondary | ICD-10-CM | POA: Diagnosis present

## 2024-10-11 DIAGNOSIS — R7303 Prediabetes: Secondary | ICD-10-CM | POA: Diagnosis present

## 2024-10-11 DIAGNOSIS — R791 Abnormal coagulation profile: Secondary | ICD-10-CM | POA: Diagnosis present

## 2024-10-11 DIAGNOSIS — Z79890 Hormone replacement therapy (postmenopausal): Secondary | ICD-10-CM

## 2024-10-11 DIAGNOSIS — E785 Hyperlipidemia, unspecified: Secondary | ICD-10-CM | POA: Diagnosis present

## 2024-10-11 DIAGNOSIS — I4821 Permanent atrial fibrillation: Secondary | ICD-10-CM | POA: Diagnosis present

## 2024-10-11 LAB — CBC
HCT: 37.5 % — ABNORMAL LOW (ref 39.0–52.0)
Hemoglobin: 12.5 g/dL — ABNORMAL LOW (ref 13.0–17.0)
MCH: 30.3 pg (ref 26.0–34.0)
MCHC: 33.3 g/dL (ref 30.0–36.0)
MCV: 90.8 fL (ref 80.0–100.0)
Platelets: 292 K/uL (ref 150–400)
RBC: 4.13 MIL/uL — ABNORMAL LOW (ref 4.22–5.81)
RDW: 13.6 % (ref 11.5–15.5)
WBC: 14.2 K/uL — ABNORMAL HIGH (ref 4.0–10.5)
nRBC: 0 % (ref 0.0–0.2)

## 2024-10-11 LAB — MAGNESIUM: Magnesium: 2.2 mg/dL (ref 1.7–2.4)

## 2024-10-11 LAB — COMPREHENSIVE METABOLIC PANEL WITH GFR
ALT: 31 U/L (ref 0–44)
AST: 31 U/L (ref 15–41)
Albumin: 3.4 g/dL — ABNORMAL LOW (ref 3.5–5.0)
Alkaline Phosphatase: 97 U/L (ref 38–126)
Anion gap: 12 (ref 5–15)
BUN: 14 mg/dL (ref 8–23)
CO2: 24 mmol/L (ref 22–32)
Calcium: 8.7 mg/dL — ABNORMAL LOW (ref 8.9–10.3)
Chloride: 101 mmol/L (ref 98–111)
Creatinine, Ser: 0.96 mg/dL (ref 0.61–1.24)
GFR, Estimated: >60 mL/min
Glucose, Bld: 122 mg/dL — ABNORMAL HIGH (ref 70–99)
Potassium: 3.5 mmol/L (ref 3.5–5.1)
Sodium: 137 mmol/L (ref 135–145)
Total Bilirubin: 0.7 mg/dL (ref 0.0–1.2)
Total Protein: 7.1 g/dL (ref 6.5–8.1)

## 2024-10-11 LAB — URINALYSIS, ROUTINE W REFLEX MICROSCOPIC
Bilirubin Urine: NEGATIVE
Glucose, UA: NEGATIVE mg/dL
Ketones, ur: NEGATIVE mg/dL
Nitrite: NEGATIVE
Protein, ur: NEGATIVE mg/dL
Specific Gravity, Urine: 1.012 (ref 1.005–1.030)
pH: 5 (ref 5.0–8.0)

## 2024-10-11 LAB — CK: Total CK: 182 U/L (ref 49–397)

## 2024-10-11 LAB — LACTIC ACID, PLASMA: Lactic Acid, Venous: 1.3 mmol/L (ref 0.5–1.9)

## 2024-10-11 LAB — RESP PANEL BY RT-PCR (RSV, FLU A&B, COVID)  RVPGX2
Influenza A by PCR: NEGATIVE
Influenza B by PCR: NEGATIVE
Resp Syncytial Virus by PCR: NEGATIVE
SARS Coronavirus 2 by RT PCR: NEGATIVE

## 2024-10-11 LAB — PROTIME-INR
INR: 3.3 — ABNORMAL HIGH (ref 0.8–1.2)
Prothrombin Time: 34.8 s — ABNORMAL HIGH (ref 11.4–15.2)

## 2024-10-11 MED ORDER — VANCOMYCIN HCL 1500 MG/300ML IV SOLN
1500.0000 mg | Freq: Two times a day (BID) | INTRAVENOUS | Status: DC
  Administered 2024-10-12 – 2024-10-14 (×5): 1500 mg via INTRAVENOUS
  Filled 2024-10-11 (×5): qty 300

## 2024-10-11 MED ORDER — AMIODARONE HCL 200 MG PO TABS: 200.0000 mg | ORAL_TABLET | Freq: Two times a day (BID) | ORAL | Status: DC

## 2024-10-11 MED ORDER — VANCOMYCIN HCL IN DEXTROSE 1-5 GM/200ML-% IV SOLN: 1000.0000 mg | Freq: Once | INTRAVENOUS | Status: DC

## 2024-10-11 MED ORDER — VANCOMYCIN HCL 1500 MG/300ML IV SOLN
1500.0000 mg | Freq: Once | INTRAVENOUS | Status: AC
  Administered 2024-10-11: 1500 mg via INTRAVENOUS
  Filled 2024-10-11 (×2): qty 300

## 2024-10-11 MED ORDER — ACETAMINOPHEN 650 MG RE SUPP: 650.0000 mg | Freq: Four times a day (QID) | RECTAL | Status: DC | PRN

## 2024-10-11 MED ORDER — ONDANSETRON HCL 4 MG PO TABS: 4.0000 mg | ORAL_TABLET | Freq: Four times a day (QID) | ORAL | Status: DC | PRN

## 2024-10-11 MED ORDER — HYDROCHLOROTHIAZIDE 25 MG PO TABS
25.0000 mg | ORAL_TABLET | Freq: Every day | ORAL | Status: DC
  Administered 2024-10-12 – 2024-10-15 (×4): 25 mg via ORAL
  Filled 2024-10-11 (×4): qty 1

## 2024-10-11 MED ORDER — VENLAFAXINE HCL ER 150 MG PO CP24
300.0000 mg | ORAL_CAPSULE | Freq: Every day | ORAL | Status: DC
  Administered 2024-10-12 – 2024-10-16 (×5): 300 mg via ORAL
  Filled 2024-10-11 (×5): qty 2

## 2024-10-11 MED ORDER — DILTIAZEM HCL 25 MG/5ML IV SOLN
15.0000 mg | Freq: Once | INTRAVENOUS | Status: AC
  Administered 2024-10-11: 15 mg via INTRAVENOUS
  Filled 2024-10-11: qty 5

## 2024-10-11 MED ORDER — ONDANSETRON HCL 4 MG/2ML IJ SOLN: 4.0000 mg | Freq: Four times a day (QID) | INTRAMUSCULAR | Status: DC | PRN

## 2024-10-11 MED ORDER — PIPERACILLIN-TAZOBACTAM 3.375 G IVPB 30 MIN
3.3750 g | Freq: Once | INTRAVENOUS | Status: AC
  Administered 2024-10-11: 3.375 g via INTRAVENOUS
  Filled 2024-10-11: qty 50

## 2024-10-11 MED ORDER — MAGNESIUM SULFATE 2 GM/50ML IV SOLN
2.0000 g | Freq: Once | INTRAVENOUS | Status: DC
  Filled 2024-10-11: qty 50

## 2024-10-11 MED ORDER — SENNOSIDES-DOCUSATE SODIUM 8.6-50 MG PO TABS: 1.0000 | ORAL_TABLET | Freq: Every evening | ORAL | Status: DC | PRN

## 2024-10-11 MED ORDER — OXYCODONE HCL 5 MG PO TABS
5.0000 mg | ORAL_TABLET | ORAL | Status: DC | PRN
  Administered 2024-10-11 – 2024-10-16 (×2): 5 mg via ORAL
  Filled 2024-10-11 (×2): qty 1

## 2024-10-11 MED ORDER — ACETAMINOPHEN 325 MG PO TABS
650.0000 mg | ORAL_TABLET | Freq: Four times a day (QID) | ORAL | Status: DC | PRN
  Administered 2024-10-12 – 2024-10-15 (×2): 650 mg via ORAL
  Filled 2024-10-11 (×2): qty 2

## 2024-10-11 MED ORDER — SODIUM CHLORIDE 0.9% FLUSH
3.0000 mL | Freq: Two times a day (BID) | INTRAVENOUS | Status: DC
  Administered 2024-10-11 – 2024-10-16 (×11): 3 mL via INTRAVENOUS

## 2024-10-11 MED ORDER — WARFARIN - PHARMACIST DOSING INPATIENT: Freq: Every day | Status: DC

## 2024-10-11 MED ORDER — METOPROLOL TARTRATE 5 MG/5ML IV SOLN
5.0000 mg | INTRAVENOUS | Status: AC | PRN
  Administered 2024-10-12 (×2): 5 mg via INTRAVENOUS
  Filled 2024-10-11 (×2): qty 5

## 2024-10-11 MED ORDER — VANCOMYCIN HCL IN DEXTROSE 1-5 GM/200ML-% IV SOLN
1000.0000 mg | Freq: Once | INTRAVENOUS | Status: AC
  Administered 2024-10-11: 1000 mg via INTRAVENOUS
  Filled 2024-10-11: qty 200

## 2024-10-11 MED ORDER — METOPROLOL SUCCINATE ER 50 MG PO TB24
100.0000 mg | ORAL_TABLET | Freq: Every day | ORAL | Status: DC
  Administered 2024-10-12: 100 mg via ORAL
  Filled 2024-10-11: qty 2

## 2024-10-11 MED ORDER — ROSUVASTATIN CALCIUM 10 MG PO TABS
10.0000 mg | ORAL_TABLET | Freq: Every day | ORAL | Status: DC
  Administered 2024-10-11 – 2024-10-16 (×6): 10 mg via ORAL
  Filled 2024-10-11 (×7): qty 1

## 2024-10-11 MED ORDER — SODIUM CHLORIDE 0.9 % IV BOLUS
1000.0000 mL | Freq: Once | INTRAVENOUS | Status: AC
  Administered 2024-10-11: 1000 mL via INTRAVENOUS

## 2024-10-11 MED ORDER — LEVOTHYROXINE SODIUM 25 MCG PO TABS
25.0000 ug | ORAL_TABLET | Freq: Every day | ORAL | Status: DC
  Administered 2024-10-12 – 2024-10-16 (×5): 25 ug via ORAL
  Filled 2024-10-11 (×5): qty 1

## 2024-10-11 MED ORDER — POTASSIUM CHLORIDE 20 MEQ PO PACK
60.0000 meq | PACK | Freq: Once | ORAL | Status: AC
  Administered 2024-10-11: 60 meq via ORAL
  Filled 2024-10-11: qty 3

## 2024-10-11 NOTE — H&P (Signed)
 " History and Physical    Johnathan [name] FMW:[id number] DOB: 03-23-1956 DOA: 10/11/2024  DOS: the patient was seen and examined on 10/11/2024  PCP: Johnathan Verneita CROME, MD   Patient coming from: Home  I have personally briefly reviewed patient's old medical records in [facility] and CareEverywhere  HPI:   Johnathan [name] is a 69 y.o. year old male with medical history of hypertension, hyperlipidemia, prediabetes, CHF, atrial fibrillation presenting to the ED with weakness and malaise along with worsening right lower extremity erythema.  Reports decreased p.o. intake along with diarrhea since leaving the hospital.  Patient was hospitalized from 12/18 - 12-25 for right lower extremity cellulitis.  He was discharged on Augmentin for 10 days.  States cellulitis was extensive and improved with IV antibiotics.  On arrival to the ED patient was noted to be HDS stable.  Lab work and imaging obtained.  CBC with leukocytosis at 14.2, mild anemia at 12.5-normocytic.  CMP with normal renal and hepatic function mild hypoalbuminemia.  Lactic acid normal.  INR at 3.3.  Chest x-ray without any acute findings.  Patient was noted to be initially in A-fib with RVR but responded to diltiazem  injection.  Given patient's symptoms, [facility] contacted for admission.  Review of Systems: As mentioned in the history of present illness. All other systems reviewed and are negative.   Past Medical History:  Diagnosis Date   Arthritis    a. chronic joint pain   B12 deficiency    Chronic systolic CHF (congestive heart failure) (HCC)    a. echo 2013: EF of 50-55%, normal right ventricular systolic pressure, normal left atrium; b. EF 40-45%, inadequate for LV wall motion, mild MR, LA severely dilated @ 52 mm, PASP nl   Complication of anesthesia    Dysrhythmia    Erythema migrans (Lyme disease) 04/08/2015   History of DVT (deep vein thrombosis) 2004   s/p bariatric surgery   History of stress test    a. there was no ST  segment deviation noted during stress,    There is a small defect of moderate severity present in the apex location, suggestive of apical ischemia, intermediate risk, calculated EF 21% but visually appears to be 35-40%   Hypertension    Hypothyroidism    Neuropathy    Obesity    OSA (obstructive sleep apnea)    a. not compliant with CPAP   PAF (paroxysmal atrial fibrillation) (HCC)    a. CHADSVASc at least 2 (HTN and vascular disease); b. on Eliquis     Pre-diabetes     Past Surgical History:  Procedure Laterality Date   ARTHROSCOPIC REPAIR ACL Bilateral 1993   bilateral knee x2 on left and x2 on right   BARIATRIC SURGERY  2004   Rou en Y    BARIATRIC SURGERY     CHOLECYSTECTOMY  1982   ELECTROPHYSIOLOGIC STUDY N/A 06/20/2015   Procedure: CARDIOVERSION;  Surgeon: Johnathan JINNY Lunger, MD;  Location: [facility] ORS;  Service: Cardiovascular;  Laterality: N/A;   GANGLION CYST EXCISION     KNEE ARTHROPLASTY Right 06/20/2017   Procedure: COMPUTER ASSISTED TOTAL KNEE ARTHROPLASTY;  Surgeon: Johnathan Lynwood SQUIBB, MD;  Location: [facility] ORS;  Service: Orthopedics;  Laterality: Right;   MENISCUS DEBRIDEMENT Left 2011   Left knee   REPLACEMENT TOTAL KNEE Left 07/2013   REVERSE SHOULDER ARTHROPLASTY Right 12/30/2022   Procedure: REVERSE SHOULDER ARTHROPLASTY;  Surgeon: Johnathan Soulier, MD;  Location: [facility] ORS;  Service: Orthopedics;  Laterality: Right;  TONSILLECTOMY       Allergies[1]  Family History  Problem Relation Age of Onset   Dementia Mother    Diabetes Father    Alzheimer's disease Father     Prior to Admission medications  Medication Sig Start Date End Date Taking? Authorizing Provider  amiodarone  (PACERONE ) 200 MG tablet Take 1 tablet by mouth twice daily 01/27/24   Gollan, Timothy J, MD  amoxicillin-clavulanate (AUGMENTIN) 875-125 MG tablet Take 1 tablet by mouth 2 (two) times daily. BID X 10 days    [provider]  B Complex Vitamins (B COMPLEX 50 PO) Take 1 tablet by mouth  daily.     [provider]  buPROPion  (WELLBUTRIN  XL) 300 MG 24 hr tablet Take 1 tablet (300 mg total) by mouth daily. 12/26/23   Johnathan Verneita CROME, MD  Cholecalciferol  (VITAMIN D3) 2000 UNITS TABS Take 2,000 Units by mouth daily.    [provider]  docusate sodium  (COLACE) 100 MG capsule Take 400 mg by mouth daily.     [provider]  Ferrous Gluconate  324 (37.5 Fe) MG TABS Take 1 tablet by mouth daily.     [provider]  furosemide  (LASIX ) 40 MG tablet Take 1 tablet (40 mg total) by mouth daily as needed. 09/15/22   Johnathan Verneita CROME, MD  gabapentin  (NEURONTIN ) 300 MG capsule TAKE 1 CAPSULE BY MOUTH THREE TIMES A DAY Patient taking differently: Take 300 mg by mouth 2 (two) times daily. TAKE 1 CAPSULE BY MOUTH TWO TIMES A DAY 12/26/23   Johnathan Verneita CROME, MD  GLUCOSAMINE HCL-MSM PO Take 1 tablet by mouth daily. Patient not taking: Reported on 10/09/2024    [provider]  hydrochlorothiazide  (HYDRODIURIL ) 25 MG tablet Take 1 tablet (25 mg total) by mouth daily. 12/26/23   Johnathan Verneita CROME, MD  HYDROcodone -acetaminophen  (NORCO) 10-325 MG tablet Take 1 tablet by mouth every 8 (eight) hours as needed. 07/03/24   Johnathan Verneita CROME, MD  HYDROcodone -acetaminophen  (NORCO) 10-325 MG tablet Take 1 tablet by mouth every 8 (eight) hours as needed. 09/10/24   Johnathan Verneita CROME, MD  HYDROcodone -acetaminophen  (NORCO) 10-325 MG tablet Take 1 tablet by mouth every 8 (eight) hours as needed. 08/10/24   Johnathan Verneita CROME, MD  levothyroxine  (SYNTHROID ) 25 MCG tablet Take 1 tablet (25 mcg total) by mouth daily before breakfast. 10/09/24   Johnathan Verneita CROME, MD  losartan  (COZAAR ) 100 MG tablet Take 1 tablet (100 mg total) by mouth daily. 12/16/23   Johnathan Verneita CROME, MD  metoprolol  succinate (TOPROL -XL) 100 MG 24 hr tablet Take 1 tablet (100 mg total) by mouth daily. 10/09/24   [name], Johnathan L, MD  Multiple Vitamin (MULTIVITAMIN) tablet Take 1 tablet by mouth daily.    [provider]   Potassium Chloride  ER 20 MEQ TBCR Take 20 mEq by mouth 2 (two) times daily as needed. 09/21/22   Gollan, Timothy J, MD  propranolol  (INDERAL ) 20 MG tablet Take 1 tablet (20 mg total) by mouth 3 (three) times daily as needed. Reported on 03/22/2016 12/26/23   Johnathan Verneita CROME, MD  rosuvastatin  (CRESTOR ) 10 MG tablet TAKE 1 TABLET BY MOUTH ONCE DAILY AT  2PM 10/09/24   Johnathan Verneita CROME, MD  sildenafil  (REVATIO ) 20 MG tablet Take 1 tablet (20 mg total) by mouth 3 (three) times daily. Patient not taking: Reported on 10/09/2024 06/28/23   Johnathan Verneita CROME, MD  tadalafil  (CIALIS ) 20 MG tablet Take 1 tablet (20 mg total) by mouth daily as needed  for erectile dysfunction. Patient not taking: Reported on 10/09/2024 04/26/22   Helon Kirsch A, PA-C  venlafaxine  XR (EFFEXOR -XR) 150 MG 24 hr capsule Take 2 capsules (300 mg total) by mouth daily with breakfast. 10/09/24   Johnathan Verneita CROME, MD  vitamin B-12 (CYANOCOBALAMIN ) 500 MCG tablet Take 500 mcg by mouth daily.    [provider]  vitamin E  400 UNIT capsule Take 400 Units by mouth daily.    [provider]  warfarin (COUMADIN ) 5 MG tablet TAKE 1 TO 1 & 1/2 (ONE & ONE-HALF) TABLETS BY MOUTH ONCE DAILY AS DIRECTED BY ANTI-COAG CLINIC 03/19/24   Perla Johnathan PARAS, MD    Social History:  reports that he has never smoked. He has never been exposed to tobacco smoke. He has never used smokeless tobacco. He reports that he does not currently use alcohol after a past usage of about 2.0 standard drinks of alcohol per week. He reports current drug use. Lives with wife, does not smoke or drink alcohol.  At baseline he is independent in ADLs and IADLs.  But has been weak recently and needs assistance.   Physical Exam: Vitals:   10/11/24 1040 10/11/24 1043 10/11/24 1140 10/11/24 1200  BP:  104/69  107/70  Pulse: (!) 130   99  Resp: 20   (!) 22  Temp: 98.1 F (36.7 C)     TempSrc: Oral     SpO2: 98%  96% 92%    Gen: NAD HENT: NCAT CV: normal  heart sounds Lung: CTAB Abd: No TTP, normal bowel sounds MSK: Right lower extremity with erythema up to the knee.  It is well-demarcated consistent with cellulitis.  See media tab. Neuro: alert and oriented   Labs on Admission: I have personally reviewed following labs and imaging studies  CBC: Recent Labs  Lab 10/11/24 1051  WBC 14.2*  HGB 12.5*  HCT 37.5*  MCV 90.8  PLT 292   Basic Metabolic Panel: Recent Labs  Lab 10/11/24 1051  NA 137  K 3.5  CL 101  CO2 24  GLUCOSE 122*  BUN 14  CREATININE 0.96  CALCIUM  8.7*   GFR: Estimated Creatinine Clearance: 116.8 mL/min (by C-G formula based on SCr of 0.96 mg/dL). Liver Function Tests: Recent Labs  Lab 10/11/24 1051  AST 31  ALT 31  ALKPHOS 97  BILITOT 0.7  PROT 7.1  ALBUMIN 3.4*   No results for input(s): LIPASE, AMYLASE in the last 168 hours. No results for input(s): AMMONIA in the last 168 hours. Coagulation Profile: Recent Labs  Lab 10/11/24 1142  INR 3.3*   Cardiac Enzymes: Recent Labs  Lab 10/11/24 1051  CKTOTAL 182   BNP (last 3 results) No results for input(s): BNP in the last 8760 hours. HbA1C: No results for input(s): HGBA1C in the last 72 hours. CBG: No results for input(s): GLUCAP in the last 168 hours. Lipid Profile: No results for input(s): CHOL, HDL, LDLCALC, TRIG, CHOLHDL, LDLDIRECT in the last 72 hours. Thyroid  Function Tests: No results for input(s): TSH, T4TOTAL, FREET4, T3FREE, THYROIDAB in the last 72 hours. Anemia Panel: No results for input(s): VITAMINB12, FOLATE, FERRITIN, TIBC, IRON , RETICCTPCT in the last 72 hours. Urine analysis:    Component Value Date/Time   COLORURINE YELLOW 03/30/2022 0941   APPEARANCEUR CLEAR 03/30/2022 0941   APPEARANCEUR Hazy 07/16/2013 0858   LABSPEC 1.020 03/30/2022 0941   LABSPEC 1.019 07/16/2013 0858   PHURINE 5.5 03/30/2022 0941   GLUCOSEU NEGATIVE 03/30/2022 0941   GLUCOSEU NEGATIVE  08/27/2015 1653   HGBUR NEGATIVE 03/30/2022 0941   BILIRUBINUR NEGATIVE 03/30/2022 0941   BILIRUBINUR negative 09/28/2018 1116   BILIRUBINUR Negative 07/16/2013 0858   KETONESUR NEGATIVE 03/30/2022 0941   PROTEINUR NEGATIVE 03/30/2022 0941   UROBILINOGEN 0.2 09/28/2018 1116   UROBILINOGEN 0.2 08/27/2015 1653   NITRITE NEGATIVE 03/30/2022 0941   LEUKOCYTESUR NEGATIVE 03/30/2022 0941   LEUKOCYTESUR Negative 07/16/2013 0858    Radiological Exams on Admission: I have personally reviewed images DG Chest 2 View Result Date: 10/11/2024 EXAM: 2 VIEW(S) XRAY OF THE CHEST 10/11/2024 11:05:00 AM COMPARISON: CXR 12/22/2022. CLINICAL HISTORY: sob FINDINGS: LUNGS AND PLEURA: Low lung volumes. No focal pulmonary opacity. No pleural effusion. No pneumothorax. HEART AND MEDIASTINUM: Unchanged cardiomediastinal silhouette. BONES AND SOFT TISSUES: Partially imaged right shoulder arthroplasty noted. Thoracic spondylosis. No acute osseous abnormality. IMPRESSION: 1. Low lung volumes. Electronically signed by: Morgane Naveau MD 10/11/2024 12:22 PM EST RP Workstation: [id number]    EKG: My personal interpretation of EKG shows: A-fib with RVR with a rate at 121.  No acute ST changes.    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  Failed to improve with oral antibiotics.  Patient given Zosyn in the ED.  Cultures obtained at outside hospital did not show any organism grown but did show some skin flora.  Will start treatment with vancomycin and if it fails to improve we can incorporate Zosyn  versus cefepime.  Patient is hemodynamically stable.  If there is any hemodynamic compromise we will broaden antibiotics.  Will follow blood cultures.  Monitor leukocyte count and trend fever curve.  If cellulitis does not improve with above interventions will consult infectious disease.  Atrial fibrillation: States this is paroxysmal.  Restarted home beta-blocker, and warfarin.  Holding amiodarone  given prolonged QTc but will replete magnesium  and potassium and repeat EKG this evening and if less than 500 will resume home amiodarone .  Weakness/falls: Suspect this is secondary to his infection and A-fib with RVR from the infection along with his right foot drop.  Will consult PT and OT and treat reversible causes.  Hypertension: Holding antihypertensives given active infection and patient being normotensive.  Hyperlipidemia: Continue home statin  Hypothyroidism: Continue home Synthroid   OSA: Not compliant with CPAP.  Will order nightly CPAP and recommend adherence.  Class II obesity: Discussed lifestyle modifications and discussed that untreated sleep apnea can further lead to weight gain.  Prediabetes: A1c checked 12 days ago was 5.5.  Will monitor daily A1c.  VTE prophylaxis:  Coumadin   Diet: Heart healthy Code Status:  Full Code Telemetry:  Admission status: Observation, Telemetry bed Patient is from: Home Anticipated d/c is to: Home Anticipated d/c is in: 1-2 days   Family Communication: Updated at bedside  Consults called: None   Severity of Illness: The appropriate patient status for this patient is OBSERVATION. Observation status is judged to be reasonable and necessary in order to provide the required intensity of service to ensure the patient's safety. The patient's presenting symptoms, physical exam findings, and initial radiographic and laboratory data in the context of their medical condition is felt to  place them at decreased risk for further clinical deterioration.  Furthermore, it is anticipated that the patient will be medically stable for discharge from the hospital within 2 midnights of admission.    Morene Bathe, MD [facility] DEL. [facility]      [1]  Allergies Allergen Reactions   Eliquis  [Apixaban ] Itching    Red spots and severe itching   Xarelto [Rivaroxaban] Hives    Reaction in 2013   "

## 2024-10-11 NOTE — ED Notes (Signed)
 1 set cultures/blue top/lt green drawn at this time.

## 2024-10-11 NOTE — Consult Note (Addendum)
 Pharmacy Antibiotic Note  ASSESSMENT: 69 y.o. male with PMH including venous insufficiency, bilateral LEE, neuropathy, right knee arthroplasty, Afib on warfarin is presenting with recurrent cellulitis. Originally presented to [facility] on 12/18 for cellulitis and received IV antibiotics (Zosyn, Vanc >> narrowed to Vanc monotherapy on 12/20) -- infection initially improved and he was ultimately discharged on Augmentin. He finished his course this morning per med rec. Augmentin caused him severe diarrhea and dehydration, resulting in falling along with Afib with RVR prompting him to present here. Pharmacy has been consulted to manage vancomycin dosing. Renal function is at baseline.  PLAN: Administer vancomycin 2500mg  IV x 1 as a loading dose, followed by 1500mg  IV q12H thereafter eAUC 498, Cmax 32, Cmin 14 Scr 0.96, IBW, Vd 0.5 L/kg (BMI 43.4) Follow up culture results to assess for antibiotic optimization. Monitor renal function to assess for any necessary antibiotic dosing changes.  Patient measurements:    Vital signs: Temp: 98.3 F (36.8 C) (01/01 1536) Temp Source: Oral (01/01 1040) BP: 113/84 (01/01 1536) Pulse Rate: 109 (01/01 1536) Recent Labs  Lab 10/11/24 1051  WBC 14.2*  CREATININE 0.96   Estimated Creatinine Clearance: 116.8 mL/min (by C-G formula based on SCr of 0.96 mg/dL).  Allergies: Allergies[1]  Antimicrobials this admission: 1/1 Zosyn x 1 1/1 Vanc >>  Dose adjustments this admission: n/a  Microbiology results: 1/1 BCx: collected  Thank you for allowing pharmacy to be a part of this patients care.  Will M. Lenon, PharmD, BCPS Clinical Pharmacist 10/11/2024 3:41 PM     [1]  Allergies Allergen Reactions   Eliquis  [Apixaban ] Itching    Red spots and severe itching   Xarelto [Rivaroxaban] Hives    Reaction in 2013

## 2024-10-11 NOTE — ED Triage Notes (Addendum)
 Pt to ED via [facility] from home. Pt reports increased weakness, diarrhea and decreased PO intake. Pt had fall yesterday and was on the floor for 10hrs. No LOC or head trauma. Recent admission for cellulitis on 12/18. Pt still on antibiotics. Med compliant. Leg red and swollen but states looks better.   99.0 oral HR irregular 123 115/73 CBG 165

## 2024-10-11 NOTE — ED Provider Notes (Signed)
 "  [facility] Provider Note    Event Date/Time   First MD Initiated Contact with Patient 10/11/24 1124     (approximate)   History   Weakness   HPI  Johnathan [name] is a 69 y.o. male presenting with concern of weakness.  Recently admitted to [facility] for cellulitis, at that time was placed on Zosyn secondary to suspected sepsis, seems that cellulitis was improving however when attempting to change antibiotics cellulitis started progressing again.  He was seen by dermatology at that time apparently he had a biopsy performed but I am not able to review the result and according to the dermatology resident note from December 22 it was determined to hold off on biopsy.  Regardless he was able to be discharged home on the 25th and placed on Augmentin for 10 days. Initially augmenting was providing some improvement, but it resulted in severe diarrhea and poor p.o. intake.  He has also been having increased weakness and falls.  He was seen by his primary doctor about 2 days ago where he did mention that his symptoms seem to be improving, but he tells me since then has not noticed any further improvement and now has worsened weakness.  He does have a history of A-fib RVR on warfarin and amiodarone  as well as metoprolol  which he did not take today.  Denies other symptoms at this time.     Physical Exam   Triage Vital Signs: ED Triage Vitals  Encounter Vitals Group     BP 10/11/24 1043 104/69     Girls Systolic BP Percentile --      Girls Diastolic BP Percentile --      Boys Systolic BP Percentile --      Boys Diastolic BP Percentile --      Pulse Rate 10/11/24 1040 (!) 130     Resp 10/11/24 1040 20     Temp 10/11/24 1040 98.1 F (36.7 C)     Temp Source 10/11/24 1040 Oral     SpO2 10/11/24 1040 98 %     Weight --      Height --      Head Circumference --      Peak Flow --      Pain Score 10/11/24 1040 5     Pain Loc --      Pain Education --      Exclude from  Growth Chart --     Most recent vital signs: Vitals:   10/11/24 1140 10/11/24 1200  BP:  107/70  Pulse:  99  Resp:  (!) 22  Temp:    SpO2: 96% 92%     General: Awake, no distress.  CV:  Good peripheral perfusion.  Resp:  Normal effort.  Abd:  No distention.  Derm:  The right lower extremity does appear to have circumferential erythema with what clinically appears consistent with cellulitis, appears to be right around the demarcated line that is circumferential along his distal lower leg and proximal ankle at which point I suspect the cellulitis was during his recent admission. Other:     ED Results / Procedures / Treatments   Labs (all labs ordered are listed, but only abnormal results are displayed) Labs Reviewed  COMPREHENSIVE METABOLIC PANEL WITH GFR - Abnormal; Notable for the following components:      Result Value   Glucose, Bld 122 (*)    Calcium  8.7 (*)    Albumin 3.4 (*)    All other  components within normal limits  CBC - Abnormal; Notable for the following components:   WBC 14.2 (*)    RBC 4.13 (*)    Hemoglobin 12.5 (*)    HCT 37.5 (*)    All other components within normal limits  PROTIME-INR - Abnormal; Notable for the following components:   Prothrombin Time 34.8 (*)    INR 3.3 (*)    All other components within normal limits  RESP PANEL BY RT-PCR (RSV, FLU A&B, COVID)  RVPGX2  CULTURE, BLOOD (ROUTINE X 2)  CULTURE, BLOOD (ROUTINE X 2)  CK  LACTIC ACID, PLASMA  URINALYSIS, ROUTINE W REFLEX MICROSCOPIC  HIV ANTIBODY (ROUTINE TESTING W REFLEX)  MAGNESIUM   CBG MONITORING, ED     EKG  Irregularly irregular rhythm with a rate of about 120, axis of about 10, right bundle branch block is present with a widened QRS, no obvious ischemia appreciated on this EKG   RADIOLOGY My independent interpretation of this chest x-ray I do not appreciate any acute cardiopulmonary  PROCEDURES:  Critical Care performed: Yes, see critical care procedure  note(s)  .Critical Care  Performed by: Fernand Rossie HERO, MD Authorized by: Fernand Rossie HERO, MD   Critical care provider statement:    Critical care time (minutes):  30   Critical care was necessary to treat or prevent imminent or life-threatening deterioration of the following conditions:  Circulatory failure, cardiac failure and sepsis   Critical care was time spent personally by me on the following activities:  Development of treatment plan with patient or surrogate, discussions with consultants, evaluation of patient's response to treatment, examination of patient, ordering and review of laboratory studies, ordering and review of radiographic studies, ordering and performing treatments and interventions, pulse oximetry, re-evaluation of patient's condition and review of old charts   Care discussed with: admitting provider      MEDICATIONS ORDERED IN ED: Medications  sodium chloride  flush (NS) 0.9 % injection 3 mL (3 mLs Intravenous Given 10/11/24 1259)  acetaminophen  (TYLENOL ) tablet 650 mg (has no administration in time range)    Or  acetaminophen  (TYLENOL ) suppository 650 mg (has no administration in time range)  senna-docusate (Senokot-S) tablet 1 tablet (has no administration in time range)  oxyCODONE  (Oxy IR/ROXICODONE ) immediate release tablet 5 mg (has no administration in time range)  ondansetron  (ZOFRAN ) tablet 4 mg (has no administration in time range)    Or  ondansetron  (ZOFRAN ) injection 4 mg (has no administration in time range)  metoprolol  tartrate (LOPRESSOR ) injection 5 mg (has no administration in time range)  sodium chloride  0.9 % bolus 1,000 mL (0 mLs Intravenous Stopped 10/11/24 1321)  piperacillin-tazobactam (ZOSYN) IVPB 3.375 g (0 g Intravenous Stopped 10/11/24 1228)  diltiazem  (CARDIZEM ) injection 15 mg (15 mg Intravenous Given 10/11/24 1145)     IMPRESSION / MDM / ASSESSMENT AND PLAN / ED COURSE  I reviewed the triage vital signs and the nursing notes.                                Patient's presentation is most consistent with acute presentation with potential threat to life or bodily function.  69 year old male recently admitted for sepsis secondary to cellulitis who presents today with concern of weakness diarrhea fatigue and poor response to his cellulitis from outpatient antibiotics.  He is tachycardic here with a leukocytosis, appears to be in A-fib RVR.  Unclear if this is secondary to idiopathic or underlying  sepsis, I suspect likely idiopathic given the clinical presentation at this time.  Will give him fluids start antibiotics and start antibiotics.  Will use Zosyn for now as this seems to have responded well and other antibiotics have unfortunately not been successful.  Will attempt rate control and anticipate likely will warrant admission for failed outpatient antibiotic therapy and A-fib RVR.   Clinical Course as of 10/11/24 1346  Thu Oct 11, 2024  1158 Heart rate has significantly improved in response to diltiazem  bolus. [SK]  1224 Spoke with Dr. Fernand who has agreed to evaluate the patient to determine course of further medical management at this time. [SK]    Clinical Course User Index [SK] Fernand Rossie HERO, MD     FINAL CLINICAL IMPRESSION(S) / ED DIAGNOSES   Final diagnoses:  Cellulitis of right lower extremity  Atrial fibrillation with RVR (HCC)     Rx / DC Orders   ED Discharge Orders     None        Note:  This document was prepared using Dragon voice recognition software and may include unintentional dictation errors.   Fernand Rossie HERO, MD 10/11/24 1346  "

## 2024-10-12 ENCOUNTER — Other Ambulatory Visit (HOSPITAL_COMMUNITY): Payer: Self-pay

## 2024-10-12 ENCOUNTER — Telehealth: Payer: Self-pay

## 2024-10-12 ENCOUNTER — Telehealth (HOSPITAL_COMMUNITY): Payer: Self-pay | Admitting: Pharmacy Technician

## 2024-10-12 ENCOUNTER — Observation Stay

## 2024-10-12 DIAGNOSIS — E66813 Obesity, class 3: Secondary | ICD-10-CM

## 2024-10-12 DIAGNOSIS — I4821 Permanent atrial fibrillation: Secondary | ICD-10-CM | POA: Diagnosis present

## 2024-10-12 DIAGNOSIS — L03115 Cellulitis of right lower limb: Secondary | ICD-10-CM | POA: Diagnosis present

## 2024-10-12 DIAGNOSIS — Z79899 Other long term (current) drug therapy: Secondary | ICD-10-CM | POA: Diagnosis not present

## 2024-10-12 DIAGNOSIS — G4733 Obstructive sleep apnea (adult) (pediatric): Secondary | ICD-10-CM | POA: Diagnosis present

## 2024-10-12 DIAGNOSIS — E039 Hypothyroidism, unspecified: Secondary | ICD-10-CM

## 2024-10-12 DIAGNOSIS — I420 Dilated cardiomyopathy: Secondary | ICD-10-CM | POA: Diagnosis present

## 2024-10-12 DIAGNOSIS — I4891 Unspecified atrial fibrillation: Secondary | ICD-10-CM | POA: Diagnosis not present

## 2024-10-12 DIAGNOSIS — I110 Hypertensive heart disease with heart failure: Secondary | ICD-10-CM | POA: Diagnosis present

## 2024-10-12 DIAGNOSIS — E876 Hypokalemia: Secondary | ICD-10-CM | POA: Diagnosis not present

## 2024-10-12 DIAGNOSIS — I4819 Other persistent atrial fibrillation: Secondary | ICD-10-CM

## 2024-10-12 DIAGNOSIS — Z96611 Presence of right artificial shoulder joint: Secondary | ICD-10-CM | POA: Diagnosis present

## 2024-10-12 DIAGNOSIS — I480 Paroxysmal atrial fibrillation: Secondary | ICD-10-CM

## 2024-10-12 DIAGNOSIS — E8809 Other disorders of plasma-protein metabolism, not elsewhere classified: Secondary | ICD-10-CM | POA: Diagnosis present

## 2024-10-12 DIAGNOSIS — A0472 Enterocolitis due to Clostridium difficile, not specified as recurrent: Secondary | ICD-10-CM

## 2024-10-12 DIAGNOSIS — Z9884 Bariatric surgery status: Secondary | ICD-10-CM | POA: Diagnosis not present

## 2024-10-12 DIAGNOSIS — I10 Essential (primary) hypertension: Secondary | ICD-10-CM | POA: Diagnosis not present

## 2024-10-12 DIAGNOSIS — I5021 Acute systolic (congestive) heart failure: Secondary | ICD-10-CM | POA: Diagnosis not present

## 2024-10-12 DIAGNOSIS — W57XXXS Bitten or stung by nonvenomous insect and other nonvenomous arthropods, sequela: Secondary | ICD-10-CM | POA: Diagnosis not present

## 2024-10-12 DIAGNOSIS — Z96653 Presence of artificial knee joint, bilateral: Secondary | ICD-10-CM | POA: Diagnosis present

## 2024-10-12 DIAGNOSIS — Z1152 Encounter for screening for COVID-19: Secondary | ICD-10-CM | POA: Diagnosis not present

## 2024-10-12 DIAGNOSIS — Z833 Family history of diabetes mellitus: Secondary | ICD-10-CM | POA: Diagnosis not present

## 2024-10-12 DIAGNOSIS — I5042 Chronic combined systolic (congestive) and diastolic (congestive) heart failure: Secondary | ICD-10-CM | POA: Diagnosis present

## 2024-10-12 DIAGNOSIS — Z7901 Long term (current) use of anticoagulants: Secondary | ICD-10-CM | POA: Diagnosis not present

## 2024-10-12 DIAGNOSIS — R531 Weakness: Secondary | ICD-10-CM

## 2024-10-12 DIAGNOSIS — E869 Volume depletion, unspecified: Secondary | ICD-10-CM | POA: Diagnosis present

## 2024-10-12 DIAGNOSIS — D649 Anemia, unspecified: Secondary | ICD-10-CM | POA: Diagnosis present

## 2024-10-12 DIAGNOSIS — I482 Chronic atrial fibrillation, unspecified: Secondary | ICD-10-CM | POA: Diagnosis not present

## 2024-10-12 DIAGNOSIS — E785 Hyperlipidemia, unspecified: Secondary | ICD-10-CM | POA: Diagnosis present

## 2024-10-12 DIAGNOSIS — R7303 Prediabetes: Secondary | ICD-10-CM | POA: Diagnosis present

## 2024-10-12 DIAGNOSIS — Z6841 Body Mass Index (BMI) 40.0 and over, adult: Secondary | ICD-10-CM | POA: Diagnosis not present

## 2024-10-12 DIAGNOSIS — Z79890 Hormone replacement therapy (postmenopausal): Secondary | ICD-10-CM | POA: Diagnosis not present

## 2024-10-12 DIAGNOSIS — I5022 Chronic systolic (congestive) heart failure: Secondary | ICD-10-CM | POA: Diagnosis not present

## 2024-10-12 LAB — CBC
HCT: 36 % — ABNORMAL LOW (ref 39.0–52.0)
Hemoglobin: 12 g/dL — ABNORMAL LOW (ref 13.0–17.0)
MCH: 30.9 pg (ref 26.0–34.0)
MCHC: 33.3 g/dL (ref 30.0–36.0)
MCV: 92.8 fL (ref 80.0–100.0)
Platelets: 274 K/uL (ref 150–400)
RBC: 3.88 MIL/uL — ABNORMAL LOW (ref 4.22–5.81)
RDW: 14 % (ref 11.5–15.5)
WBC: 11.6 K/uL — ABNORMAL HIGH (ref 4.0–10.5)
nRBC: 0 % (ref 0.0–0.2)

## 2024-10-12 LAB — C DIFFICILE QUICK SCREEN W PCR REFLEX
C Diff antigen: POSITIVE — AB
C Diff interpretation: DETECTED
C Diff toxin: POSITIVE — AB

## 2024-10-12 LAB — BASIC METABOLIC PANEL WITH GFR
Anion gap: 13 (ref 5–15)
BUN: 13 mg/dL (ref 8–23)
CO2: 24 mmol/L (ref 22–32)
Calcium: 8.2 mg/dL — ABNORMAL LOW (ref 8.9–10.3)
Chloride: 102 mmol/L (ref 98–111)
Creatinine, Ser: 0.92 mg/dL (ref 0.61–1.24)
GFR, Estimated: >60 mL/min
Glucose, Bld: 88 mg/dL (ref 70–99)
Potassium: 3.6 mmol/L (ref 3.5–5.1)
Sodium: 139 mmol/L (ref 135–145)

## 2024-10-12 LAB — PROTIME-INR
INR: 3.6 — ABNORMAL HIGH (ref 0.8–1.2)
Prothrombin Time: 37.2 s — ABNORMAL HIGH (ref 11.4–15.2)

## 2024-10-12 LAB — HIV ANTIBODY (ROUTINE TESTING W REFLEX): HIV Screen 4th Generation wRfx: NONREACTIVE

## 2024-10-12 LAB — GLUCOSE, CAPILLARY: Glucose-Capillary: 92 mg/dL (ref 70–99)

## 2024-10-12 MED ORDER — METOPROLOL TARTRATE 50 MG PO TABS
75.0000 mg | ORAL_TABLET | Freq: Two times a day (BID) | ORAL | Status: DC
  Administered 2024-10-12 – 2024-10-13 (×2): 75 mg via ORAL
  Filled 2024-10-12 (×2): qty 1

## 2024-10-12 MED ORDER — METOPROLOL TARTRATE 50 MG PO TABS
50.0000 mg | ORAL_TABLET | Freq: Once | ORAL | Status: AC
  Administered 2024-10-12: 50 mg via ORAL
  Filled 2024-10-12: qty 1

## 2024-10-12 MED ORDER — VANCOMYCIN HCL 125 MG PO CAPS
125.0000 mg | ORAL_CAPSULE | Freq: Four times a day (QID) | ORAL | Status: DC
  Administered 2024-10-12 – 2024-10-16 (×17): 125 mg via ORAL
  Filled 2024-10-12 (×19): qty 1

## 2024-10-12 NOTE — Assessment & Plan Note (Addendum)
 Staph hominis growing out of culture from [facility].  Was sent home on Augmentin.  On IV vancomycin  initially here, will switch over to doxycycline .  Compression wraps ordered.  Elevation right leg.

## 2024-10-12 NOTE — Evaluation (Signed)
 Occupational Therapy Evaluation Patient Details Name: Johnathan [name] MRN: [id number] DOB: 1955-10-31 Today's Date: 10/12/2024   History of Present Illness   69 y.o. male with PMH including venous insufficiency, bilateral LEE, HTN, HLD, prediabetes, neuropathy, right knee arthroplasty, Afib on warfarin is presenting with recurrent cellulitis. Originally presented to [facility] on 12/18 for cellulitis and received IV antibiotics (Zosyn, Vanc >> narrowed to Vanc monotherapy on 12/20) -- infection initially improved and he was ultimately discharged on Augmentin. He finished his course this morning per med rec. Augmentin caused him severe diarrhea and dehydration, resulting in falling along with Afib with RVR prompting him to present here.     Clinical Impressions Pt was seen for OT evaluation this date. Prior to hospital admission, pt was using AD for mobility, endorses 4 falls in past 64mo in part due to hx R foot drop and decr strength/activity tolerance, and typically able to complete dressing and toileting tasks himself. Pt lives with his spouse in a [facility] with ramped entrance. He has a shower chair that sits partway into the tub/shower with a handheld showerhead and long handled sponge for bathing. Pt presents with deficits in strength, activity tolerance, HR in Afib, and impaired balance limiting their ability to perform ADL management at baseline level. Pt currently requires grossly MIN A for LB ADL tasks, SBA-CGA for ADL transfers with RW, and endorses fatigue with limited exertion. Pt/spouse edu in role of acute OT, falls prevention, ECS including PLB, activity tolerance, AE/DME, and home/routines modifications.  Pt/spouse verbalized understanding and pt would benefit from skilled OT services to address noted impairments and functional limitations (see below for any additional details) in order to maximize safety and independence while minimizing future risk of falls, injury, and readmission. Anticipate the  need for follow up OT services upon acute hospital DC.    If plan is discharge home, recommend the following:   A little help with walking and/or transfers;A little help with bathing/dressing/bathroom;Assistance with cooking/housework;Assist for transportation;Help with stairs or ramp for entrance     Functional Status Assessment   Patient has had a recent decline in their functional status and demonstrates the ability to make significant improvements in function in a reasonable and predictable amount of time.     Equipment Recommendations   Other (comment) (defer)     Recommendations for Other Services         Precautions/Restrictions   Precautions Precautions: Fall Recall of Precautions/Restrictions: Intact Restrictions Weight Bearing Restrictions Per Provider Order: No     Mobility Bed Mobility               General bed mobility comments: NT, in recliner pre and post session    Transfers                   General transfer comment: Pt declined 2/2 fatigue      Balance Overall balance assessment: Needs assistance Sitting-balance support: Feet supported Sitting balance-Leahy Scale: Good                                     ADL either performed or assessed with clinical judgement   ADL Overall ADL's : Needs assistance/impaired                                       General ADL  Comments: Anticipate pt requires SBA-CGA for ADL transfers/mobility with AD, PRN MIN A for LB ADL tasks, and increased time/effort to complete tasks 2/2 fatigue.     Vision         Perception         Praxis         Pertinent Vitals/Pain Pain Assessment Pain Assessment: Faces Faces Pain Scale: Hurts a little bit Pain Location: r leg, back Pain Descriptors / Indicators: Aching, Grimacing Pain Intervention(s): Limited activity within patient's tolerance, Monitored during session, Repositioned     Extremity/Trunk  Assessment Upper Extremity Assessment Upper Extremity Assessment: Overall WFL for tasks assessed   Lower Extremity Assessment Lower Extremity Assessment: Generalized weakness (hx R foot drop)       Communication Communication Communication: No apparent difficulties   Cognition Arousal: Alert Behavior During Therapy: WFL for tasks assessed/performed Cognition: No apparent impairments                               Following commands: Intact       Cueing  General Comments   Cueing Techniques: Verbal cues      Exercises Other Exercises Other Exercises: Pt/spouse edu in role of acute OT, falls prevention, ECS including PLB, activity tolerance, AE/DME, and home/routines modifications   Shoulder Instructions      Home Living Family/patient expects to be discharged to:: Private residence Living Arrangements: Spouse/significant other Available Help at Discharge: Family;Available 24 hours/day Type of Home: House Home Access: Ramped entrance     Home Layout: One level     Bathroom Shower/Tub: Chief Strategy Officer: Handicapped height Bathroom Accessibility: Yes   Home Equipment: Rollator (4 wheels);Shower seat;Cane - single Librarian, Academic (2 wheels);Adaptive equipment;Hand held shower head Adaptive Equipment: Reacher;Long-handled sponge Additional Comments: 4 falls in past month - pt reports fatigue and R foot drop as contributing      Prior Functioning/Environment Prior Level of Function : Independent/Modified Independent;History of Falls (last six months)             Mobility Comments: rollator for a couple years now ADLs Comments: normally indep, needing assistance more lately in the last month    OT Problem List: Decreased strength;Cardiopulmonary status limiting activity;Decreased activity tolerance;Impaired balance (sitting and/or standing);Decreased knowledge of use of DME or AE   OT Treatment/Interventions: Self-care/ADL  training;Therapeutic exercise;Therapeutic activities;Energy conservation;DME and/or AE instruction;Patient/family education;Balance training      OT Goals(Current goals can be found in the care plan section)   Acute Rehab OT Goals Patient Stated Goal: get stronger to be able to go home OT Goal Formulation: With patient/family Time For Goal Achievement: 10/26/24 Potential to Achieve Goals: Good ADL Goals Pt Will Perform Lower Body Dressing: with modified independence;sit to/from stand;sitting/lateral leans;with adaptive equipment Pt Will Transfer to Toilet: with modified independence;ambulating (LRAD) Pt Will Perform Toileting - Clothing Manipulation and hygiene: with modified independence;sitting/lateral leans;sit to/from stand Additional ADL Goal #1: Pt will verbalize plan to implement at least 2 learned ECS/falls prevention strategies to maximize safety/indep and minimize over exertion/falls. Additional ADL Goal #2: Pt will complete all aspects of bathing, primarily in sitting, utilizing learned ECS with mod indep, 1/1 opportunity.   OT Frequency:  Min 2X/week    Co-evaluation              AM-PAC OT 6 Clicks Daily Activity     Outcome Measure Help from another person eating meals?: None Help from  another person taking care of personal grooming?: None Help from another person toileting, which includes using toliet, bedpan, or urinal?: A Little Help from another person bathing (including washing, rinsing, drying)?: A Little Help from another person to put on and taking off regular upper body clothing?: None Help from another person to put on and taking off regular lower body clothing?: A Little 6 Click Score: 21   End of Session    Activity Tolerance: Patient limited by fatigue Patient left: in chair;with call bell/phone within reach;with chair alarm set;with nursing/sitter in room;with family/visitor present  OT Visit Diagnosis: Other abnormalities of gait and mobility  (R26.89);Repeated falls (R29.6);Muscle weakness (generalized) (M62.81)                Time: 8558-8541 OT Time Calculation (min): 17 min Charges:  OT General Charges $OT Visit: 1 Visit OT Evaluation $OT Eval Low Complexity: 1 Low OT Treatments $Self Care/Home Management : 8-22 mins  Warren SAUNDERS., MPH, MS, OTR/L ascom [phone] 10/12/2024, 3:18 PM

## 2024-10-12 NOTE — Assessment & Plan Note (Addendum)
 Continue increased dose of metoprolol .  Continue hydrochlorothiazide 

## 2024-10-12 NOTE — Hospital Course (Addendum)
 69 y.o. year old male with medical history of hypertension, hyperlipidemia, prediabetes, CHF, atrial fibrillation presenting to the ED with weakness and malaise along with worsening right lower extremity erythema.  Reports decreased p.o. intake along with diarrhea since leaving the hospital.  Patient was hospitalized from 12/18 - 12-25 for right lower extremity cellulitis.  He was discharged on Augmentin for 10 days.  States cellulitis was extensive and improved with IV antibiotics.  On arrival to the ED patient was noted to be HDS stable.  Lab work and imaging obtained.  CBC with leukocytosis at 14.2, mild anemia at 12.5-normocytic.  CMP with normal renal and hepatic function mild hypoalbuminemia.  Lactic acid normal.  INR at 3.3.  Chest x-ray without any acute findings.  Patient was noted to be initially in A-fib with RVR but responded to diltiazem  injection.  Given patient's symptoms, [facility] contacted for admission.   10/12/2024.  Patient had an episode of diarrhea so I sent off a stool for C. difficile and that is positive.  Added p.o. vancomycin  also.  Appreciate wound care nurse for compression wraps.  Leg elevation for cellulitis.  Recent hospitalization at [facility] with Staph hominis growing out of culture and was sent home on Augmentin. 10/13/2024.  Metoprolol  increased to 100 mg twice daily 10/14/2024.  While I was in the room patient needed to go to the bathroom and had a stage VI mushy stool. 10/15/2024.  Patient still having mushy bowel movement.  Only 1 bowel movement yesterday 10/16/2024.  Received insurance authorization for rehab.  Patient will have 3 more doses of doxycycline  and complete that antibiotic for right lower extremity cellulitis.  Will give 10 days of vancomycin  orally 4 times a day

## 2024-10-12 NOTE — Plan of Care (Signed)

## 2024-10-12 NOTE — Progress Notes (Signed)
 " Progress Note   Patient: Johnathan [name] FMW:[id number] DOB: 04/30/1956 DOA: 10/11/2024     0 DOS: the patient was seen and examined on 10/12/2024   Brief hospital course: 69 y.o. year old male with medical history of hypertension, hyperlipidemia, prediabetes, CHF, atrial fibrillation presenting to the ED with weakness and malaise along with worsening right lower extremity erythema.  Reports decreased p.o. intake along with diarrhea since leaving the hospital.  Patient was hospitalized from 12/18 - 12-25 for right lower extremity cellulitis.  He was discharged on Augmentin for 10 days.  States cellulitis was extensive and improved with IV antibiotics.  On arrival to the ED patient was noted to be HDS stable.  Lab work and imaging obtained.  CBC with leukocytosis at 14.2, mild anemia at 12.5-normocytic.  CMP with normal renal and hepatic function mild hypoalbuminemia.  Lactic acid normal.  INR at 3.3.  Chest x-ray without any acute findings.  Patient was noted to be initially in A-fib with RVR but responded to diltiazem  injection.  Given patient's symptoms, [facility] contacted for admission.   10/12/2024.  Patient had an episode of diarrhea so I sent off a stool for C. difficile and that is positive.  Added p.o. vancomycin also.  Appreciate wound care nurse for compression wraps.  Leg elevation for cellulitis.  Recent hospitalization at [facility] with Staph hominis growing out of culture and was sent home on Augmentin.  Assessment and Plan: * C. difficile colitis Start p.o. vancomycin.  Will need treatment for 10 days.  Cellulitis of right lower extremity Staph hominis growing out of culture from [facility].  Was sent home on Augmentin.  On IV vancomycin here.  May be a candidate for Zyvox upon going home.  Compression wraps ordered.  Elevation right leg.  Paroxysmal atrial fibrillation with RVR (HCC) Patient's wife states that he was cardioverted last time in the hospital with atrial fibrillation.  Therapeutic on  Coumadin .  Will switch Toprol  over to metoprolol  twice daily with a dose now.  Heart rates around 110 with heart rate going up to 140 on telemetry monitoring.  Case discussed with cardiology to evaluate on whether he needs another cardioversion or not.  Acquired hypothyroidism On levothyroxine   Hyperlipidemia On Crestor   OSA (obstructive sleep apnea) Supportive care  Obesity, Class III, BMI 40-49.9 (morbid obesity) (HCC) BMI 42.46  Weakness PT and OT evaluation  Essential hypertension Change Toprol  over to metoprolol , continue hydrochlorothiazide         Subjective: Patient complains of weakness and falls.  Coming back with cellulitis of the right lower extremity.  States he did not get totally better from the last treatment at [facility] [place].  Physical Exam: Vitals:   10/12/24 0135 10/12/24 0457 10/12/24 0501 10/12/24 1039  BP:  126/84  (!) 136/96  Pulse: (!) 105 (!) 120 (!) 113 (!) 115  Resp:  19  18  Temp:  99.6 F (37.6 C)  98.5 F (36.9 C)  TempSrc:  Oral    SpO2:  95%  96%  Weight:   (!) 154.1 kg    Physical Exam HENT:     Head: Normocephalic.  Eyes:     General: Lids are normal.  Cardiovascular:     Rate and Rhythm: Tachycardia present. Rhythm irregularly irregular.     Heart sounds: Normal heart sounds, S1 normal and S2 normal.  Pulmonary:     Breath sounds: Examination of the right-lower field reveals decreased breath sounds. Examination of the left-lower field reveals decreased breath sounds.  Decreased breath sounds present. No wheezing, rhonchi or rales.  Abdominal:     Palpations: Abdomen is soft.     Tenderness: There is no abdominal tenderness.  Musculoskeletal:     Right lower leg: Swelling present.     Left lower leg: Swelling present.  Skin:    General: Skin is warm.     Comments: Right leg erythema is mostly posterior in dependent areas.  Erythema on front of the leg mostly improved.  Neurological:     Mental Status: He is alert and oriented to  person, place, and time.     Data Reviewed: Creatinine 0.92, electrolytes normal range, white blood cell count 11.6, hemoglobin 12.0, platelet count 274, INR 3.6 Sonogram of the lower extremities negative C. difficile positive  Family Communication: Updated patient's wife on the phone  Disposition: Status is: Inpatient Remains inpatient appropriate because: Treating C. difficile colitis with p.o. vancomycin, treating cellulitis with IV vancomycin.  Planned Discharge Destination: Home with Home Health    Time spent: 29 minutes Case discussed with cardiology  Author: Charlie Patterson, MD 10/12/2024 2:43 PM  For on call review [email or web address].  "

## 2024-10-12 NOTE — Telephone Encounter (Signed)
 Copied from CRM [id number]. Topic: General - Other >> Oct 12, 2024 10:18 AM Emylou G wrote: Reason for CRM: Wife called.. wanted to relay patient in the hospital.. if doctor needs to call her she can, but if not its okay.[facility]..[name]

## 2024-10-12 NOTE — Progress Notes (Signed)
 OT Cancellation Note  Patient Details Name: Johnathan [name] MRN: [id number] DOB: 1955-11-16   Cancelled Treatment:    Reason Eval/Treat Not Completed: Patient at procedure or test/ unavailable. Pt out of the room for imaging. Will re-attempt at later time as pt is available and appropriate.   Damonie Ellenwood R., MPH, MS, OTR/L ascom [phone] 10/12/2024, 9:30 AM

## 2024-10-12 NOTE — Consult Note (Addendum)
 WOC Nurse Consult Note: Reason for Consult: Consult requested for bilat legs.  Performed remotely after review of progress notes and photo in the EMR.  Pt has generalized edema and erythremia and weeping to bilat lower legs, more pronounced to right leg. They are already on systemic antibiotics for cellulitis. Patchy areas of red partial thickness skin loss.     Topical treatment orders provided for bedside nurses to perform as follows to absorb drainage and provide light compression: Apply Xeroform gauze to bilat legs Q day, then cover with ABD and kerlex, beginning just behind toes to below knees, then ace wrap in the same manner.   Please re-consult if further assistance is needed.  Thank-you,  Stephane Fought MSN, RN, CWOCN, CWCN-AP, CNS Contact Mon-Fri 0700-1500: [phone]

## 2024-10-12 NOTE — Assessment & Plan Note (Deleted)
 Patient's wife states that he was cardioverted last time in the hospital with atrial fibrillation.  Therapeutic on Coumadin .  Will switch Toprol  over to metoprolol  twice daily with a dose now.  Heart rates around 110 with heart rate going up to 140 on telemetry monitoring.  Case discussed with cardiology to evaluate on whether he needs another cardioversion or not.

## 2024-10-12 NOTE — Progress Notes (Signed)
 Spoke with pt about using the Cpap that has been ordered and pt stated absolutely not he will not wear the machine order will be discontinued

## 2024-10-12 NOTE — Assessment & Plan Note (Signed)
 On Crestor 

## 2024-10-12 NOTE — Assessment & Plan Note (Addendum)
"  Does not wear CPAP.   "

## 2024-10-12 NOTE — Progress Notes (Signed)
 PHARMACY - ANTICOAGULATION CONSULT NOTE  Pharmacy Consult for Warfarin Indication: atrial fibrillation  Allergies[1]  Patient Measurements: Weight: (!) 154.1 kg (339 lb 11.7 oz)  Vital Signs: Temp: 99.6 F (37.6 C) (01/02 0457) Temp Source: Oral (01/02 0457) BP: 126/84 (01/02 0457) Pulse Rate: 113 (01/02 0501)  Labs: Recent Labs    10/11/24 1051 10/11/24 1142 10/12/24 0510  HGB 12.5*  --  12.0*  HCT 37.5*  --  36.0*  PLT 292  --  274  LABPROT  --  34.8* 37.2*  INR  --  3.3* 3.6*  CREATININE 0.96  --  0.92  CKTOTAL 182  --   --     Estimated Creatinine Clearance: 120.4 mL/min (by C-G formula based on SCr of 0.92 mg/dL).   Medical History: Past Medical History:  Diagnosis Date   Arthritis    a. chronic joint pain   B12 deficiency    Chronic systolic CHF (congestive heart failure) (HCC)    a. echo 2013: EF of 50-55%, normal right ventricular systolic pressure, normal left atrium; b. EF 40-45%, inadequate for LV wall motion, mild MR, LA severely dilated @ 52 mm, PASP nl   Complication of anesthesia    Dysrhythmia    Erythema migrans (Lyme disease) 04/08/2015   History of DVT (deep vein thrombosis) 2004   s/p bariatric surgery   History of stress test    a. there was no ST segment deviation noted during stress,    There is a small defect of moderate severity present in the apex location, suggestive of apical ischemia, intermediate risk, calculated EF 21% but visually appears to be 35-40%   Hypertension    Hypothyroidism    Neuropathy    Obesity    OSA (obstructive sleep apnea)    a. not compliant with CPAP   PAF (paroxysmal atrial fibrillation) (HCC)    a. CHADSVASc at least 2 (HTN and vascular disease); b. on Eliquis     Pre-diabetes     Medications:  Warfarin home regimen is 7.5mg  on Mon and Fri, 5mg  all other days. Last dose 1/1  Assessment: 69 y.o. male with PMH including venous insufficiency, bilateral LEE, neuropathy, right knee arthroplasty, Afib on  warfarin is presenting with recurrent cellulitis. Pharmacy has been consulted to dose warfarin while inpatient.  Home dose: 7.5mg  on Mon and Fri, 5mg  all other days Last dose: 1/1 Goal INR: 2-3 Baseline INR: 3.3  Drug interactions: venlafaxine  (increased risk of bleeding)  Date INR Warfarin Dose  1/1 3.3 Patient took dose at home  1/2 3.6 HOLD     Goal of Therapy:  INR 2-3 Monitor platelets by anticoagulation protocol: Yes   Plan:  - INR SUPRAtherapeutic and increasing - Hold warfarin today - INR daily   Lum VEAR Mania, PharmD, BCPS 10/12/2024,7:26 AM      [1]  Allergies Allergen Reactions   Eliquis  [Apixaban ] Itching    Red spots and severe itching   Xarelto [Rivaroxaban] Hives    Reaction in 2013

## 2024-10-12 NOTE — Evaluation (Signed)
 Physical Therapy Evaluation Patient Details Name: Johnathan [name] MRN: [id number] DOB: 1955-12-26 Today's Date: 10/12/2024  History of Present Illness  69 y.o. male with PMH including venous insufficiency, bilateral LEE, HTN, HLD, prediabetes, neuropathy, right knee arthroplasty, Afib on warfarin is presenting with recurrent cellulitis. Originally presented to [facility] on 12/18 for cellulitis and received IV antibiotics (Zosyn, Vanc >> narrowed to Vanc monotherapy on 12/20) -- infection initially improved and he was ultimately discharged on Augmentin. He finished his course this morning per med rec. Augmentin caused him severe diarrhea and dehydration, resulting in falling along with Afib with RVR prompting him to present here.   Clinical Impression  Pt alert, agreeable to PT, family at bedside, demonstrated mild pain of RLE with mobility. At baseline the patient is modI with rollator, lives with his wife. But after recent discharge from [facility], pt has experienced a decline in mobility as well as at least 4 falls.   He was able to complete bed mobility with supervision, use of bed features. Sit <> stand with RW and CGA several times during session, pt had to gather momentum and attempt several times to complete without assistance. He ambulated two bouts of 66ft, needed a standing rest break due to fatigue. HR in 130s with exertion, spO2 WFLs.  Overall the patient demonstrated deficits (see PT Problem List) that impede the patient's functional abilities, safety, and mobility and would benefit from skilled PT intervention.        If plan is discharge home, recommend the following: A little help with walking and/or transfers;A little help with bathing/dressing/bathroom;Assistance with cooking/housework;Assist for transportation;Help with stairs or ramp for entrance   Can travel by private vehicle   Yes    Equipment Recommendations Other (comment) (TBD)  Recommendations for Other Services        Functional Status Assessment Patient has had a recent decline in their functional status and demonstrates the ability to make significant improvements in function in a reasonable and predictable amount of time.     Precautions / Restrictions Precautions Precautions: Fall Recall of Precautions/Restrictions: Intact Restrictions Weight Bearing Restrictions Per Provider Order: No      Mobility  Bed Mobility Overal bed mobility: Needs Assistance Bed Mobility: Supine to Sit     Supine to sit: Supervision, HOB elevated, Used rails          Transfers Overall transfer level: Needs assistance Equipment used: Rolling walker (2 wheels) Transfers: Sit to/from Stand Sit to Stand: Contact guard assist           General transfer comment: from standard height, needed several attempts to complete successfully    Ambulation/Gait Ambulation/Gait assistance: Contact guard assist Gait Distance (Feet): 50 Feet (standing leaning rest break at 47ft,)     Gait velocity: decreased     General Gait Details: HR in 130s with exertion  Stairs            Wheelchair Mobility     Tilt Bed    Modified Rankin (Stroke Patients Only)       Balance Overall balance assessment: Needs assistance Sitting-balance support: Feet supported Sitting balance-Leahy Scale: Good Sitting balance - Comments: able to use urinal seated EOB   Standing balance support: Bilateral upper extremity supported Standing balance-Leahy Scale: Poor                               Pertinent Vitals/Pain Pain Assessment Pain Assessment: Faces Faces Pain Scale:  Hurts a little bit Pain Location: r leg Pain Descriptors / Indicators: Aching, Grimacing Pain Intervention(s): Limited activity within patient's tolerance, Monitored during session, Premedicated before session, Repositioned    Home Living Family/patient expects to be discharged to:: Private residence Living Arrangements:  Spouse/significant other Available Help at Discharge: Family;Available 24 hours/day Type of Home: House Home Access: Ramped entrance       Home Layout: One level Home Equipment: Rollator (4 wheels);Shower seat;Cane - single Librarian, Academic (2 wheels) Additional Comments: 4 falls    Prior Function Prior Level of Function : Independent/Modified Independent             Mobility Comments: rollator for a couple years now ADLs Comments: normally indep, needing assistance more lately in the last month     Extremity/Trunk Assessment   Upper Extremity Assessment Upper Extremity Assessment: Overall WFL for tasks assessed    Lower Extremity Assessment Lower Extremity Assessment: Generalized weakness (able to lift against gravity, R foot drop present in gait)       Communication        Cognition Arousal: Alert Behavior During Therapy: WFL for tasks assessed/performed   PT - Cognitive impairments: No apparent impairments                         Following commands: Intact       Cueing       General Comments      Exercises     Assessment/Plan    PT Assessment Patient needs continued PT services  PT Problem List Decreased balance;Decreased mobility;Decreased activity tolerance;Pain;Decreased range of motion;Decreased knowledge of use of DME       PT Treatment Interventions DME instruction;Balance training;Gait training;Neuromuscular re-education;Stair training;Patient/family education;Functional mobility training;Therapeutic activities;Therapeutic exercise    PT Goals (Current goals can be found in the Care Plan section)  Acute Rehab PT Goals Patient Stated Goal: to get stronger PT Goal Formulation: With patient Time For Goal Achievement: 10/26/24 Potential to Achieve Goals: Good    Frequency Min 2X/week     Co-evaluation               AM-PAC PT 6 Clicks Mobility  Outcome Measure Help needed turning from your back to your side  while in a flat bed without using bedrails?: A Little Help needed moving from lying on your back to sitting on the side of a flat bed without using bedrails?: A Little Help needed moving to and from a bed to a chair (including a wheelchair)?: A Little Help needed standing up from a chair using your arms (e.g., wheelchair or bedside chair)?: A Little Help needed to walk in hospital room?: A Little Help needed climbing 3-5 steps with a railing? : A Little 6 Click Score: 18    End of Session   Activity Tolerance: Patient limited by fatigue Patient left: in chair;with call bell/phone within reach;with chair alarm set Nurse Communication: Mobility status PT Visit Diagnosis: Other abnormalities of gait and mobility (R26.89);Difficulty in walking, not elsewhere classified (R26.2);Muscle weakness (generalized) (M62.81)    Time: 8671-8642 PT Time Calculation (min) (ACUTE ONLY): 29 min   Charges:   PT Evaluation $PT Eval Low Complexity: 1 Low PT Treatments $Therapeutic Activity: 23-37 mins PT General Charges $$ ACUTE PT VISIT: 1 Visit         Doyal Shams PT, DPT 3:00 PM,10/12/2024

## 2024-10-12 NOTE — Assessment & Plan Note (Signed)
-  On levothyroxine

## 2024-10-12 NOTE — Assessment & Plan Note (Addendum)
 Continue p.o. vancomycin .  Will give another 10 days of antibiotic.  Had 1 episode of stage VI stool yesterday and 1 episode today.

## 2024-10-12 NOTE — Assessment & Plan Note (Addendum)
 BMI 42.38

## 2024-10-12 NOTE — Assessment & Plan Note (Addendum)
 PT and OT recommending rehab

## 2024-10-12 NOTE — Telephone Encounter (Signed)
 Patient Product/process Development Scientist completed.    The patient is insured through [employer]/ [employer]. Patient has Medicare and is not eligible for a copay card, but may be able to apply for patient assistance or Medicare RX Payment Plan (Patient Must reach out to their plan, if eligible for payment plan), if available.    Ran test claim for linezolid 600 mg and the current 10 day co-pay is $73.61 due to a deductible.   This test claim was processed through [facility]- copay amounts may vary at other pharmacies due to pharmacy/plan contracts, or as the patient moves through the different stages of their insurance plan.     Reyes Sharps, CPHT Pharmacy Technician Patient Advocate Specialist [facility] Patient Advocate Team Direct Number: [phone]  Fax: [phone]

## 2024-10-12 NOTE — Telephone Encounter (Signed)
 Spoke with pt's wife and she stated that she just wanted to let Dr. Marylynn that pt was back in the hospital since they were just here on Tuesday.

## 2024-10-12 NOTE — Consult Note (Signed)
 "  Cardiology Consultation   Patient ID: Johnathan [name] MRN: [id number]; DOB: Feb 22, 1956  Admit date: 10/11/2024 Date of Consult: 10/12/2024  PCP:  Johnathan Verneita CROME, [name]   [facility] Providers Cardiologist:  Johnathan Lunger, [name]        Patient Profile: Johnathan [name] is a 69 y.o. male with a hx of persistent atrial fibrillation (05/2012), morbid obesity, hypertension, history of DVT (right lower extremity, 2004), OSA not on CPAP, chronic joint pain, chronic HFmrEF/dilated cardiomyopathy, hypothyroidism, chronic venous insufficiency who is being seen 10/12/2024 for the evaluation of atrial fibrillation with RVR at the request of Johnathan [name].  History of Present Illness: Johnathan [name] who previously was diagnosed with atrial fibrillation in August 2013 with an echocardiogram revealing normal ejection fraction.  Echocardiogram repeated in 2016 revealed LVEF of 40-45%.  Noted to have past medical history of DVT in the right lower extremity in 2004.  Prior cardioversion that was successful in restoring him to sinus rhythm.  Unfortunately had a tick bite in June 2016 and developed atrial fibrillation initially with RVR.  He was then started on digoxin  and metoprolol  and diltiazem .  Echocardiogram and stress were done at that time.  He EF was estimated at 40-45% and stress test showed a small region of an apical perfusion defect.  Was evaluated by Johnathan [name] in July 2024 and was advised he would need 4 weeks of therapeutic INRs before he can undergo repeat cardioversion procedure.  He was last seen in clinic 11/01/2023 and needed a cardiovascular examination prior to right carpal tunnel release.  He presented to the [facility] emergency department on 10/11/2024 with complaint of weakness.  He had recently been admitted to [facility] for cellulitis and at that time was placed on Zosyn secondary to suspected sepsis, seemed that cellulitis was improving however when attempting to change antibiotics cellulitis is started  progressing again.  He was evaluated by dermatology.  He was then able to be discharged home on 25 December and placed on Augmentin for 10 days.  Initially Augmentin had provided some improvement but resulted in severe diarrhea and poor p.o. intake.  He was also having increased weakness and falls.  He was seen by his PCP approximately 2 days prior to his emergency department arrival and mentioned that his symptoms seem to be improving.  Initial vitals reveal blood pressure 104/69, heart rate of 130, respirations of 20, temperature of 98.1.  Pertinent labs revealed blood glucose 122, calcium  8.7, hemoglobin 12.5, INR 3.3.  Respiratory panel was negative for COVID, flu, and RSV.  Stool was positive for C. difficile antigen and toxin.  He was treated in the emergency department with diltiazem  50 mg IV PA, Zosyn, normal saline, Lopressor  5 mg IVP.  Heart rate significantly improved with response to diltiazem  bolus.  He was admitted for cellulitis of the right lower extremity and atrial fibrillation with RVR.  Cardiology was consulted today to assist with ongoing management of atrial fibrillation.  Past Medical History:  Diagnosis Date   Arthritis    a. chronic joint pain   B12 deficiency    Chronic systolic CHF (congestive heart failure) (HCC)    a. echo 2013: EF of 50-55%, normal right ventricular systolic pressure, normal left atrium; b. EF 40-45%, inadequate for LV wall motion, mild MR, LA severely dilated @ 52 mm, PASP nl   Complication of anesthesia    Dysrhythmia    Erythema migrans (Lyme disease) 04/08/2015   History of DVT (deep vein thrombosis)  2004   s/p bariatric surgery   History of stress test    a. there was no ST segment deviation noted during stress,    There is a small defect of moderate severity present in the apex location, suggestive of apical ischemia, intermediate risk, calculated EF 21% but visually appears to be 35-40%   Hypertension    Hypothyroidism    Neuropathy     Obesity    OSA (obstructive sleep apnea)    a. not compliant with CPAP   PAF (paroxysmal atrial fibrillation) (HCC)    a. CHADSVASc at least 2 (HTN and vascular disease); b. on Eliquis     Pre-diabetes     Past Surgical History:  Procedure Laterality Date   ARTHROSCOPIC REPAIR ACL Bilateral 1993   bilateral knee x2 on left and x2 on right   BARIATRIC SURGERY  2004   Rou en Y    BARIATRIC SURGERY     CHOLECYSTECTOMY  1982   ELECTROPHYSIOLOGIC STUDY N/A 06/20/2015   Procedure: CARDIOVERSION;  Surgeon: Johnathan JINNY Lunger, [name];  Location: [facility] ORS;  Service: Cardiovascular;  Laterality: N/A;   GANGLION CYST EXCISION     KNEE ARTHROPLASTY Right 06/20/2017   Procedure: COMPUTER ASSISTED TOTAL KNEE ARTHROPLASTY;  Surgeon: Johnathan Lynwood SQUIBB, [name];  Location: [facility] ORS;  Service: Orthopedics;  Laterality: Right;   MENISCUS DEBRIDEMENT Left 2011   Left knee   REPLACEMENT TOTAL KNEE Left 07/2013   REVERSE SHOULDER ARTHROPLASTY Right 12/30/2022   Procedure: REVERSE SHOULDER ARTHROPLASTY;  Surgeon: Johnathan Soulier, [name];  Location: [facility] ORS;  Service: Orthopedics;  Laterality: Right;   TONSILLECTOMY       Home Medications:  Prior to Admission medications  Medication Sig Start Date End Date Taking? Authorizing Provider  amiodarone  (PACERONE ) 200 MG tablet Take 1 tablet by mouth twice daily 01/27/24  Yes [name], Johnathan [name]  amoxicillin-clavulanate (AUGMENTIN) 875-125 MG tablet Take 1 tablet by mouth 2 (two) times daily. BID X 10 days   Yes Provider, Historical, [name]  B Complex Vitamins (B COMPLEX 50 PO) Take 1 tablet by mouth daily.    Yes Provider, Historical, [name]  buPROPion  (WELLBUTRIN  XL) 300 MG 24 hr tablet Take 1 tablet (300 mg total) by mouth daily. 12/26/23  Yes Johnathan Verneita CROME, [name]  Cholecalciferol  (VITAMIN D3) 2000 UNITS TABS Take 2,000 Units by mouth daily.   Yes Provider, Historical, [name]  Ferrous Gluconate  324 (37.5 Fe) MG TABS Take 1 tablet by mouth daily.    Yes Provider, Historical, [name]   furosemide  (LASIX ) 40 MG tablet Take 1 tablet (40 mg total) by mouth daily as needed. 09/15/22  Yes Johnathan Verneita CROME, [name]  gabapentin  (NEURONTIN ) 300 MG capsule TAKE 1 CAPSULE BY MOUTH THREE TIMES A DAY Patient taking differently: Take 300 mg by mouth 2 (two) times daily. TAKE 1 CAPSULE BY MOUTH TWO TIMES A DAY 12/26/23  Yes Johnathan Verneita CROME, [name]  hydrochlorothiazide  (HYDRODIURIL ) 25 MG tablet Take 1 tablet (25 mg total) by mouth daily. 12/26/23  Yes Johnathan Verneita CROME, [name]  HYDROcodone -acetaminophen  (NORCO) 10-325 MG tablet Take 1 tablet by mouth every 8 (eight) hours as needed. 09/10/24  Yes Johnathan Verneita CROME, [name]  levothyroxine  (SYNTHROID ) 25 MCG tablet Take 1 tablet (25 mcg total) by mouth daily before breakfast. 10/09/24  Yes Johnathan Verneita CROME, [name]  losartan  (COZAAR ) 100 MG tablet Take 1 tablet (100 mg total) by mouth daily. 12/16/23  Yes Johnathan Verneita CROME, [name]  metoprolol  succinate (TOPROL -XL) 100 MG 24 hr tablet Take  1 tablet (100 mg total) by mouth daily. 10/09/24  Yes [name], Teresa L, [name]  Multiple Vitamin (MULTIVITAMIN) tablet Take 1 tablet by mouth daily.   Yes Provider, Historical, [name]  Potassium Chloride  ER 20 MEQ TBCR Take 20 mEq by mouth 2 (two) times daily as needed. 09/21/22  Yes [name], Johnathan [name]  propranolol  (INDERAL ) 20 MG tablet Take 1 tablet (20 mg total) by mouth 3 (three) times daily as needed. Reported on 03/22/2016 12/26/23  Yes Johnathan Verneita CROME, [name]  rosuvastatin  (CRESTOR ) 10 MG tablet TAKE 1 TABLET BY MOUTH ONCE DAILY AT  2PM 10/09/24  Yes Johnathan Verneita CROME, [name]  venlafaxine  XR (EFFEXOR -XR) 150 MG 24 hr capsule Take 2 capsules (300 mg total) by mouth daily with breakfast. 10/09/24  Yes Johnathan Verneita CROME, [name]  vitamin B-12 (CYANOCOBALAMIN ) 500 MCG tablet Take 500 mcg by mouth daily.   Yes Provider, Historical, [name]  vitamin E  400 UNIT capsule Take 400 Units by mouth daily.   Yes Provider, Historical, [name]  warfarin (COUMADIN ) 5 MG tablet TAKE 1 TO 1 & 1/2 (ONE & ONE-HALF) TABLETS BY MOUTH ONCE DAILY AS  DIRECTED BY ANTI-COAG CLINIC 03/19/24  Yes [name], Johnathan [name]  docusate sodium  (COLACE) 100 MG capsule Take 400 mg by mouth daily.  Patient not taking: Reported on 10/11/2024    Provider, Historical, [name]  GLUCOSAMINE HCL-MSM PO Take 1 tablet by mouth daily. Patient not taking: Reported on 09/04/2024    Provider, Historical, [name]  sildenafil  (REVATIO ) 20 MG tablet Take 1 tablet (20 mg total) by mouth 3 (three) times daily. Patient not taking: Reported on 09/04/2024 06/28/23   Johnathan Verneita CROME, [name]  tadalafil  (CIALIS ) 20 MG tablet Take 1 tablet (20 mg total) by mouth daily as needed for erectile dysfunction. Patient not taking: Reported on 04/09/2024 04/26/22   Helon Kirsch A, PA-C    Scheduled Meds:  hydrochlorothiazide   25 mg Oral Daily   levothyroxine   25 mcg Oral Q0600   metoprolol  tartrate  75 mg Oral BID   rosuvastatin   10 mg Oral Daily   sodium chloride  flush  3 mL Intravenous Q12H   vancomycin  125 mg Oral QID   venlafaxine  XR  300 mg Oral Q breakfast   Warfarin - Pharmacist Dosing Inpatient   Does not apply q1600   Continuous Infusions:  vancomycin 1,500 mg (10/12/24 0909)   PRN Meds: acetaminophen  **OR** acetaminophen , ondansetron  **OR** ondansetron  (ZOFRAN ) IV, oxyCODONE , senna-docusate  Allergies:   Allergies[1]  Social History:   Social History   Socioeconomic History   Marital status: Married    Spouse name: Not on file   Number of children: Not on file   Years of education: Not on file   Highest education level: Not on file  Occupational History   Not on file  Tobacco Use   Smoking status: Never    Passive exposure: Never   Smokeless tobacco: Never  Vaping Use   Vaping status: Never Used  Substance and Sexual Activity   Alcohol use: Not Currently    Alcohol/week: 2.0 standard drinks of alcohol    Types: 2 Shots of liquor per week    Comment: Vodka and lemonade occasionally 2-3/week    Drug use: Yes    Comment: gummies delta A   Sexual activity: Yes     Partners: Female    Comment: Wife  Other Topics Concern   Not on file  Social History Narrative   Currently between jobs   Lives with  Wife   1 Son (33) and 1 daughter (56)    2 grandchildren by daughter    1 dog and 1 cat    Enjoys gardening, movies, dining out.       Are you right handed or left handed? Right Handed    Are you currently employed ? No   What is your current occupation? Retired    Do you live at home alone? No    Who lives with you? Wife    What type of home do you live in: 1 story or 2 story? One story home       Social Drivers of Health   Tobacco Use: Low Risk (10/11/2024)   Patient History    Smoking Tobacco Use: Never    Smokeless Tobacco Use: Never    Passive Exposure: Never  Financial Resource Strain: Low Risk (09/04/2024)   Overall Financial Resource Strain (CARDIA)    Difficulty of Paying Living Expenses: Not hard at all  Food Insecurity: No Food Insecurity (10/11/2024)   Epic    Worried About Programme Researcher, Broadcasting/film/video in the Last Year: Never true    Ran Out of Food in the Last Year: Never true  Transportation Needs: No Transportation Needs (10/11/2024)   Epic    Lack of Transportation (Medical): No    Lack of Transportation (Non-Medical): No  Physical Activity: Insufficiently Active (09/04/2024)   Exercise Vital Sign    Days of Exercise per Week: 3 days    Minutes of Exercise per Session: 40 min  Stress: No Stress Concern Present (09/04/2024)   [employer] of Occupational Health - Occupational Stress Questionnaire    Feeling of Stress: Only a little  Social Connections: Moderately Isolated (10/11/2024)   Social Connection and Isolation Panel    Frequency of Communication with Friends and Family: Never    Frequency of Social Gatherings with Friends and Family: Never    Attends Religious Services: Never    Database Administrator or Organizations: Yes    Attends Engineer, Structural: More than 4 times per year    Marital Status: Married   Catering Manager Violence: Not At Risk (10/11/2024)   Epic    Fear of Current or Ex-Partner: No    Emotionally Abused: No    Physically Abused: No    Sexually Abused: No  Depression (PHQ2-9): Medium Risk (10/09/2024)   Depression (PHQ2-9)    PHQ-2 Score: 7  Alcohol Screen: Low Risk (09/04/2024)   Alcohol Screen    Last Alcohol Screening Score (AUDIT): 0  Housing: Low Risk (10/11/2024)   Epic    Unable to Pay for Housing in the Last Year: No    Number of Times Moved in the Last Year: 0    Homeless in the Last Year: No  Utilities: Not At Risk (10/11/2024)   Epic    Threatened with loss of utilities: No  Health Literacy: Adequate Health Literacy (09/04/2024)   B1300 Health Literacy    Frequency of need for help with medical instructions: Never    Family History:    Family History  Problem Relation Age of Onset   Dementia Mother    Diabetes Father    Alzheimer's disease Father      ROS:  Please see the history of present illness.  Review of Systems  Constitutional:  Positive for malaise/fatigue.  Cardiovascular:  Positive for palpitations and leg swelling.  Gastrointestinal:  Positive for diarrhea.  Musculoskeletal:  Positive for falls.  Neurological:  Positive for weakness.    All other ROS reviewed and negative.     Physical Exam/Data: Vitals:   10/12/24 0457 10/12/24 0501 10/12/24 1039 10/12/24 1454  BP: 126/84  (!) 136/96 117/87  Pulse: (!) 120 (!) 113 (!) 115 (!) 106  Resp: 19  18 20   Temp: 99.6 F (37.6 C)  98.5 F (36.9 C) 98.6 F (37 C)  TempSrc: Oral   Oral  SpO2: 95%  96% 97%  Weight:  (!) 154.1 kg      Intake/Output Summary (Last 24 hours) at 10/12/2024 1513 Last data filed at 10/12/2024 1501 Gross per 24 hour  Intake 497.04 ml  Output 1225 ml  Net -727.96 ml      10/12/2024    5:01 AM 10/11/2024    4:49 PM 10/09/2024    2:49 PM  Last 3 Weights  Weight (lbs) 339 lb 11.7 oz 339 lb 8.1 oz 347 lb 6.4 oz  Weight (kg) 154.1 kg 154 kg 157.58 kg      Body mass index is 42.46 kg/m.  General:  Well nourished, well developed, in no acute distress HEENT: normal Neck: Unable to determine JVD due to body habitus Vascular: No carotid bruits; Distal pulses 2+ bilaterally Cardiac:  normal S1, S2; IR IR; no murmur  Lungs:  clear upper lobes with diminished bases to auscultation bilaterally, no wheezing, rhonchi or rales, respirations are unlabored at rest on room air Abd: soft, nontender, obese, no hepatomegaly  Ext: Right lower extremity greater than the left lower extremity 1-2+edema, right extremity cellulitis and erythema posterior and dependent areas Musculoskeletal:  No deformities, BUE and BLE strength normal and equal Skin: warm and dry  Neuro:  CNs 2-12 intact, no focal abnormalities noted Psych:  Normal affect   EKG:  The EKG was personally reviewed and demonstrates: Atrial fibrillation with PVCs versus aberrantly conducted beats at a rate of 108, right bundle branch block, ST depression T wave inversions in the lateral leads Telemetry:  Telemetry was personally reviewed and demonstrates: Atrial fibrillation with improving rates from 170 to 110  Relevant CV Studies:  Event Monitor (Zio) 10/18/2022 Normal sinus rhythm Patient had a min HR of 44 bpm, max HR of 113 bpm, and avg HR of 57 bpm.  First Degree AV Block was present.    Isolated SVEs were rare (<1.0%), SVE Triplets were rare (<1.0%), and no SVE Couplets were present.   Isolated VEs were rare (<1.0%, 1673), VE Couplets were rare (<1.0%, 4), and VE Triplets were rare (<1.0%, 2).  Ventricular Bigeminy was present. Inverted QRS complexes possibly due to inverted placement of device.   Patient triggered events (13) associated with normal sinus rhythm   Lexiscan  Myoview 04/19/2015 There was no ST segment deviation noted during stress. Defect 1: There is a small defect of moderate severity present in the apex location. This is suggestive of apical ischemia. Findings consistent  with ischemia. This is an intermediate risk study. The left ventricular ejection fraction is moderately decreased (30-44%). Calculated EF was 21% but visually appears to be 35-40%.   2D echo 04/18/2015 Study Conclusions  - Left ventricle: The cavity size was normal. There was mild    concentric hypertrophy. Systolic function was mildly to    moderately reduced. The estimated ejection fraction was in the    range of 40% to 45%. Images were inadequate for LV wall motion    assessment.  - Mitral valve: There was mild regurgitation.  - Left atrium: The atrium was  severely dilated.  - Pulmonary arteries: Systolic pressure was within the normal    range.   Laboratory Data: High Sensitivity Troponin:  No results for input(s): TROPONINIHS in the last 720 hours. No results for input(s): TRNPT in the last 720 hours.    Chemistry Recent Labs  Lab 10/11/24 1051 10/12/24 0510  NA 137 139  K 3.5 3.6  CL 101 102  CO2 24 24  GLUCOSE 122* 88  BUN 14 13  CREATININE 0.96 0.92  CALCIUM  8.7* 8.2*  MG 2.2  --   GFRNONAA >60 >60  ANIONGAP 12 13    Recent Labs  Lab 10/11/24 1051  PROT 7.1  ALBUMIN 3.4*  AST 31  ALT 31  ALKPHOS 97  BILITOT 0.7   Lipids No results for input(s): CHOL, TRIG, HDL, LABVLDL, LDLCALC, CHOLHDL in the last 168 hours.  Hematology Recent Labs  Lab 10/11/24 1051 10/12/24 0510  WBC 14.2* 11.6*  RBC 4.13* 3.88*  HGB 12.5* 12.0*  HCT 37.5* 36.0*  MCV 90.8 92.8  MCH 30.3 30.9  MCHC 33.3 33.3  RDW 13.6 14.0  PLT 292 274   Thyroid  No results for input(s): TSH, FREET4 in the last 168 hours.  BNPNo results for input(s): BNP, PROBNP in the last 168 hours.  DDimer No results for input(s): DDIMER in the last 168 hours.  Radiology/Studies:  US  Venous Img Lower Bilateral (DVT) Result Date: 10/12/2024 CLINICAL DATA:  Lower extremity edema. EXAM: BILATERAL LOWER EXTREMITY VENOUS DOPPLER ULTRASOUND TECHNIQUE: Gray-scale sonography with graded  compression, as well as color Doppler and duplex ultrasound were performed to evaluate the lower extremity deep venous systems from the level of the common femoral vein and including the common femoral, femoral, profunda femoral, popliteal and calf veins including the posterior tibial, peroneal and gastrocnemius veins when visible. Spectral Doppler was utilized to evaluate flow at rest and with distal augmentation maneuvers in the common femoral, femoral and popliteal veins. COMPARISON:  None Available. FINDINGS: RIGHT LOWER EXTREMITY Common Femoral Vein: No evidence of thrombus. Normal compressibility, respiratory phasicity and response to augmentation. Saphenofemoral Junction: No evidence of thrombus. Normal compressibility and flow on color Doppler imaging. Profunda Femoral Vein: No evidence of thrombus. Normal compressibility and flow on color Doppler imaging. Femoral Vein: No evidence of thrombus. Normal compressibility, respiratory phasicity and response to augmentation. Popliteal Vein: No evidence of thrombus. Normal compressibility, respiratory phasicity and response to augmentation. Calf Veins: No evidence of thrombus. Normal compressibility and flow on color Doppler imaging. Other Findings:  None. LEFT LOWER EXTREMITY Common Femoral Vein: No evidence of thrombus. Normal compressibility, respiratory phasicity and response to augmentation. Saphenofemoral Junction: No evidence of thrombus. Normal compressibility and flow on color Doppler imaging. Profunda Femoral Vein: No evidence of thrombus. Normal compressibility and flow on color Doppler imaging. Femoral Vein: No evidence of thrombus. Normal compressibility, respiratory phasicity and response to augmentation. Popliteal Vein: No evidence of thrombus. Normal compressibility, respiratory phasicity and response to augmentation. Calf Veins: No evidence of thrombus. Normal compressibility and flow on color Doppler imaging. Other Findings:  None. IMPRESSION: No  evidence of deep venous thrombosis in either lower extremity. Electronically Signed   By: Juliene Balder M.D.   On: 10/12/2024 11:28   DG Tibia/Fibula Right Result Date: 10/11/2024 CLINICAL DATA:  Right lower extremity cellulitis. EXAM: RIGHT TIBIA AND FIBULA - 2 VIEW COMPARISON:  Right knee radiograph dated 06/20/2017. 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 The arthroplasty components appear intact and in anatomic alignment. There is no  acute fracture or dislocation. Degenerative changes of the ankle. There is a small suprapatellar effusion. Mild diffuse subcutaneous edema. No opaque foreign object/gas. IMPRESSION: 1. No acute fracture or dislocation. 2. Small suprapatellar effusion. Electronically Signed   By: Vanetta Chou M.D.   On: 10/11/2024 13:55   DG Chest 2 View Result Date: 10/11/2024 EXAM: 2 VIEW(S) XRAY OF THE CHEST 10/11/2024 11:05:00 AM COMPARISON: CXR 12/22/2022. CLINICAL HISTORY: sob FINDINGS: LUNGS AND PLEURA: Low lung volumes. No focal pulmonary opacity. No pleural effusion. No pneumothorax. HEART AND MEDIASTINUM: Unchanged cardiomediastinal silhouette. BONES AND SOFT TISSUES: Partially imaged right shoulder arthroplasty noted. Thoracic spondylosis. No acute osseous abnormality. IMPRESSION: 1. Low lung volumes. Electronically signed by: Morgane Naveau [name] 10/11/2024 12:22 PM EST RP Workstation: [id number]     Assessment and Plan: Persistent atrial fibrillation with RVR -Initially diagnosed with atrial fibrillation in 2013 -Cardioverted x 1, presented back to the hospital in 2016 from a tick bite and was found to be back in atrial fibrillation -Since that time he had been in and out of atrial fibrillation until around July 2024, since then on review of his EKGs he has maintained atrial fibrillation even throughout his recent admission at [facility] -Previously had been on amiodarone , metoprolol , and warfarin -Has not been compliant with Coumadin  clinic with his last INR  approximately 6 months prior -Had been continued on warfarin with INR 3.6, ongoing monitoring and dose adjustment per pharmacy protocol -Daily INR while on antibiotic therapy -PTA amiodarone  on hold -Continued on metoprolol  to tartrate 75 mg twice daily for improvement of rate -Continue on telemetry monitoring  Cellulitis of the right lower extremity -Previously admitted to [facility] with staph hominis growing from cultures -Discharged on Augmentin -IV antibiotic therapy per IM - Compression wraps ordered  -Being followed by wound care -Leg elevation -Ongoing management per IM team  C. difficile colitis -Complaints of bouts of diarrhea - Stool samples were positive for C. Difficile - Oral antibiotic therapy, treatment for 10 days with vancomycin -Ongoing management per IM  Primary hypertension -Blood pressure 117/87 -Continued on hydrochlorothiazide  25 mg daily, metoprolol  tartrate 75 mg twice a daily -Vital signs per unit protocol  Chronic HFmrEF/dilated cardiomyopathy -Last LVEF of 40-45% -Continues to have chronic shortness of breath is unchanged -Swelling noted to the lower extremities with cellulitis to the right lower extremity and previous DVT - Echocardiogram ordered and pending with further recommendations to follow -Toprol -XL changed to metoprolol  to tartrate for better rate control for atrial fibrillation -Daily weights and I's and O's  Hyperlipidemia -LDL 50 -Continued on statin therapy  Obstructive sleep apnea -Previously was noncompliant with CPAP therapy -Supportive care -Ongoing management per IM  Obesity -Complicates prognoses - BMI 42.46 - Weight loss would be beneficial   Risk Assessment/Risk Scores:         CHA2DS2-VASc Score = 3   This indicates a 3.2% annual risk of stroke. The patient's score is based upon: CHF History: 1 HTN History: 1 Diabetes History: 0 Stroke History: 0 Vascular Disease History: 0 Age Score: 1 Gender Score: 0         For questions or updates, please contact [facility] Please consult [email or web address] for contact info under      Signed, Francis Doenges, NP  10/12/2024 3:13 PM;     [1]  Allergies Allergen Reactions   Eliquis  [Apixaban ] Itching    Red spots and severe itching   Xarelto [Rivaroxaban] Hives    Reaction in 2013   "

## 2024-10-12 NOTE — Plan of Care (Signed)

## 2024-10-13 ENCOUNTER — Inpatient Hospital Stay (HOSPITAL_COMMUNITY)
Admit: 2024-10-13 | Discharge: 2024-10-13 | Disposition: A | Discharge status: Home / Self Care | Attending: Cardiology | Admitting: Cardiology

## 2024-10-13 DIAGNOSIS — I4891 Unspecified atrial fibrillation: Secondary | ICD-10-CM

## 2024-10-13 DIAGNOSIS — I482 Chronic atrial fibrillation, unspecified: Secondary | ICD-10-CM

## 2024-10-13 DIAGNOSIS — L03115 Cellulitis of right lower limb: Secondary | ICD-10-CM | POA: Diagnosis not present

## 2024-10-13 DIAGNOSIS — I5042 Chronic combined systolic (congestive) and diastolic (congestive) heart failure: Secondary | ICD-10-CM | POA: Diagnosis not present

## 2024-10-13 DIAGNOSIS — G4733 Obstructive sleep apnea (adult) (pediatric): Secondary | ICD-10-CM

## 2024-10-13 DIAGNOSIS — A0472 Enterocolitis due to Clostridium difficile, not specified as recurrent: Secondary | ICD-10-CM | POA: Diagnosis not present

## 2024-10-13 DIAGNOSIS — I420 Dilated cardiomyopathy: Secondary | ICD-10-CM | POA: Diagnosis not present

## 2024-10-13 DIAGNOSIS — E039 Hypothyroidism, unspecified: Secondary | ICD-10-CM | POA: Diagnosis not present

## 2024-10-13 LAB — ECHOCARDIOGRAM COMPLETE
AR max vel: 2.9 cm2
AV Peak grad: 5.3 mmHg
Ao pk vel: 1.16 m/s
Area-P 1/2: 4.29 cm2
S' Lateral: 5.19 cm
Single Plane A4C EF: 45.7 %
Weight: 5435.66 [oz_av]

## 2024-10-13 LAB — PROTIME-INR
INR: 3.6 — ABNORMAL HIGH (ref 0.8–1.2)
Prothrombin Time: 37.5 s — ABNORMAL HIGH (ref 11.4–15.2)

## 2024-10-13 LAB — CREATININE, SERUM
Creatinine, Ser: 0.85 mg/dL (ref 0.61–1.24)
GFR, Estimated: >60 mL/min

## 2024-10-13 LAB — GLUCOSE, CAPILLARY: Glucose-Capillary: 80 mg/dL (ref 70–99)

## 2024-10-13 MED ORDER — WARFARIN SODIUM 2.5 MG PO TABS
2.5000 mg | ORAL_TABLET | Freq: Once | ORAL | Status: AC
  Administered 2024-10-13: 2.5 mg via ORAL
  Filled 2024-10-13: qty 1

## 2024-10-13 MED ORDER — METOPROLOL TARTRATE 50 MG PO TABS
100.0000 mg | ORAL_TABLET | Freq: Two times a day (BID) | ORAL | Status: DC
  Administered 2024-10-13 – 2024-10-15 (×4): 100 mg via ORAL
  Filled 2024-10-13 (×4): qty 2

## 2024-10-13 MED ORDER — PERFLUTREN LIPID MICROSPHERE
1.0000 mL | INTRAVENOUS | Status: AC | PRN
  Administered 2024-10-13: 5 mL via INTRAVENOUS

## 2024-10-13 NOTE — Progress Notes (Signed)
 " Progress Note   Patient: Johnathan [name] FMW:[id number] DOB: 07/07/1956 DOA: 10/11/2024     1 DOS: the patient was seen and examined on 10/13/2024   Brief hospital course: 69 y.o. year old male with medical history of hypertension, hyperlipidemia, prediabetes, CHF, atrial fibrillation presenting to the ED with weakness and malaise along with worsening right lower extremity erythema.  Reports decreased p.o. intake along with diarrhea since leaving the hospital.  Patient was hospitalized from 12/18 - 12-25 for right lower extremity cellulitis.  He was discharged on Augmentin for 10 days.  States cellulitis was extensive and improved with IV antibiotics.  On arrival to the ED patient was noted to be HDS stable.  Lab work and imaging obtained.  CBC with leukocytosis at 14.2, mild anemia at 12.5-normocytic.  CMP with normal renal and hepatic function mild hypoalbuminemia.  Lactic acid normal.  INR at 3.3.  Chest x-ray without any acute findings.  Patient was noted to be initially in A-fib with RVR but responded to diltiazem  injection.  Given patient's symptoms, TR[facility] contacted for admission.   10/12/2024.  Patient had an episode of diarrhea so I sent off a stool for C. difficile and that is positive.  Added p.o. vancomycin  also.  Appreciate wound care nurse for compression wraps.  Leg elevation for cellulitis.  Recent hospitalization at [facility] with Staph hominis growing out of culture and was sent home on Augmentin.  Assessment and Plan: * C. difficile colitis Continue p.o. vancomycin .  Patient states he is having diarrhea but more mucus and not a lot each time.  Cellulitis of right lower extremity Staph hominis growing out of culture from [facility].  Was sent home on Augmentin.  On IV vancomycin  here.  Potentially will switch over to Zyvox.  Compression wraps ordered.  Elevation right leg.  Chronic atrial fibrillation with rapid ventricular response (HCC) His Toprol -XL was converted over to metoprolol  75 mg twice a  day yesterday.  Increased to 100 mg twice a day today.  Patient on Coumadin  with INR slightly elevated.  Pharmacy dosing.  Acquired hypothyroidism On levothyroxine   Hyperlipidemia On Crestor   OSA (obstructive sleep apnea) Does not wear CPAP  Obesity, Class III, BMI 40-49.9 (morbid obesity) (HCC) BMI 42.46  Weakness PT and OT recommending rehab  Essential hypertension Continue increased dose of metoprolol .  Continue hydrochlorothiazide         Subjective: Patient feeling better.  Still having some diarrhea but more mucus.  Heart rate still ranging high 90s to low 100s.  Admitted with right lower extremity cellulitis.  Physical Exam: Vitals:   10/12/24 1718 10/12/24 2012 10/13/24 0440 10/13/24 0743  BP: 117/82 117/83 (!) 131/96 (!) 126/90  Pulse: 95 (!) 106 90 (!) 108  Resp: 17 17 18 17   Temp: 98.9 F (37.2 C) 99.3 F (37.4 C) 98.2 F (36.8 C) 98.3 F (36.8 C)  TempSrc:    Oral  SpO2: 96% 96% 98% 94%  Weight:       Physical Exam HENT:     Head: Normocephalic.  Eyes:     General: Lids are normal.  Cardiovascular:     Rate and Rhythm: Tachycardia present. Rhythm irregularly irregular.     Heart sounds: Normal heart sounds, S1 normal and S2 normal.  Pulmonary:     Breath sounds: Examination of the right-lower field reveals decreased breath sounds. Examination of the left-lower field reveals decreased breath sounds. Decreased breath sounds present. No wheezing, rhonchi or rales.  Abdominal:     Palpations:  Abdomen is soft.     Tenderness: There is no abdominal tenderness.  Musculoskeletal:     Right lower leg: Swelling present.     Left lower leg: Swelling present.  Skin:    General: Skin is warm.     Comments: Right leg erythema is mostly posterior in dependent areas.  Erythema on front of the leg mostly improved.  Neurological:     Mental Status: He is alert and oriented to person, place, and time.     Data Reviewed: Creatinine 0.85, INR 3.6  Family  Communication: Updated wife on the phone  Disposition: Status is: Inpatient Remains inpatient appropriate because: Physical therapy recommending rehab.  Patient is on antibiotics IV for cellulitis and oral vancomycin  for C. difficile.  Planned Discharge Destination: Rehab    Time spent: 28 minutes  Author: Charlie Patterson, MD 10/13/2024 2:49 PM  For on call review [email or web address].  "

## 2024-10-13 NOTE — TOC Initial Note (Signed)
 Transition of Care [facility]) - Initial/Assessment Note    Patient Details  Name: Johnathan [name] MRN: [id number] Date of Birth: 05-13-1956  [facility] CM/SW Contact:    Cleon Thoma L Yahia Bottger, LCSW Phone Number: 10/13/2024, 6:15 PM  Clinical Narrative:                  CSW spoke with spouse after several attempts to speak with patient. Comer advised her husband needs to go to SNF. She stated that he was recently discharged from [facility] and she felt that he was not ready to go home. She stated that he fell after she advised him not to get out of the car. Comer stated that she is a small woman and patient is 350lbs. She stated that she can not lift him up or hold him up so she always has to call EMS or the fire department for assistance. She advised that if he comes home, he is not going to work with PT or OT. She stated that he sits in a chair all day and he is not motivated to move.   Comer advised of no preferences for SNF. She stated that she has a [employer] address but their home is closer to [place]. CSW advised her to review facilities on the Medicare.gov website. CSW advised that [facility] and [facility] are in her residential area.   FL2 completed. Bed searches initiated.        Patient Goals and CMS Choice            Expected Discharge Plan and Services                                              Prior Living Arrangements/Services                       Activities of Daily Living   ADL Screening (condition at time of admission) Independently performs ADLs?: No Does the patient have a NEW difficulty with bathing/dressing/toileting/self-feeding that is expected to last >3 days?: Yes (Initiates electronic notice to provider for possible OT consult) Does the patient have a NEW difficulty with getting in/out of bed, walking, or climbing stairs that is expected to last >3 days?: Yes (Initiates electronic notice to provider  for possible PT consult) Does the patient have a NEW difficulty with communication that is expected to last >3 days?: No Is the patient deaf or have difficulty hearing?: No Does the patient have difficulty seeing, even when wearing glasses/contacts?: No Does the patient have difficulty concentrating, remembering, or making decisions?: No  Permission Sought/Granted                  Emotional Assessment              Admission diagnosis:  Cellulitis of right lower extremity [L03.115] Atrial fibrillation with RVR (HCC) [I48.91] Patient Active Problem List   Diagnosis Date Noted   C. difficile colitis 10/12/2024   Cellulitis of right lower extremity 10/11/2024   Hospital discharge follow-up 10/10/2024   Cellulitis of right lower leg 10/10/2024   Trichomonas infection 10/10/2024   Right wrist tendinitis 02/16/2023   Chronic pain disorder 01/24/2023   Arthritis of right wrist 11/02/2022   Arrhythmia 09/16/2022   Rotator cuff arthropathy, right 04/27/2022   Cervical spondylitis with radiculitis 03/21/2022   Aspermia  03/21/2022   Erectile dysfunction 03/21/2022   Prediabetes 12/15/2021   Lumbar radiculopathy 04/03/2021   Acquired thrombophilia 10/02/2020   Encounter for preventive health examination 05/31/2020   Frequent falls 07/01/2019   Urinary frequency 12/28/2017   Major depressive disorder with current active episode 09/26/2017   S/P total knee arthroplasty 06/20/2017   Preoperative evaluation to rule out surgical contraindication 08/02/2016   Chronic venous insufficiency 06/23/2016   Acquired hypothyroidism 04/04/2016   Long-term use of high-risk medication 03/22/2016   Hyperlipidemia 03/22/2016   Pain in joint, ankle and foot 12/04/2015   CHF (congestive heart failure) (HCC) 05/21/2015   Bilateral leg edema 05/21/2015   OSA (obstructive sleep apnea)    History of DVT (deep vein thrombosis)    B12 deficiency    S/P left unicompartmental knee replacement  02/05/2015   Routine general medical examination at a health care facility 11/08/2014   Obesity, Class III, BMI 40-49.9 (morbid obesity) (HCC) 08/03/2014   Neck pain 05/13/2014   Rotator cuff insufficiency of left shoulder 05/13/2014   Weakness 05/06/2014   Essential hypertension 10/17/2013   Polyarthralgia 07/04/2013   Neuropathy 07/04/2013   S/P bariatric surgery 07/04/2013   Degenerative joint disease involving multiple joints on both sides of body 07/10/2012   Chronic atrial fibrillation with rapid ventricular response (HCC) 06/19/2012   PCP:  Marylynn Verneita CROME, MD Pharmacy:   [facility] [address] [place] - [address] [place] [address] Phone: [phone] Fax: [phone]  [id number] [place], [place] - [address] [place] [address] Phone: [phone] Fax: [phone]     Social Drivers of Health (SDOH) Social History: SDOH Screenings   Food Insecurity: No Food Insecurity (10/11/2024)  Housing: Low Risk (10/11/2024)  Transportation Needs: No Transportation Needs (10/11/2024)  Utilities: Not At Risk (10/11/2024)  Alcohol Screen: Low Risk (09/04/2024)  Depression (PHQ2-9): Medium Risk (10/09/2024)  Financial Resource Strain: Low Risk (09/04/2024)  Physical Activity: Insufficiently Active (09/04/2024)  Social Connections: Moderately Isolated (10/11/2024)  Stress: No Stress Concern Present (09/04/2024)  Tobacco Use: Low Risk (10/11/2024)  Health Literacy: Adequate Health Literacy (09/04/2024)   SDOH Interventions:     Readmission Risk Interventions     No data to display

## 2024-10-13 NOTE — Plan of Care (Signed)

## 2024-10-13 NOTE — [place] FL2 (Signed)
 " [place]  MEDICAID FL2 LEVEL OF CARE FORM     IDENTIFICATION  Patient Name: Johnathan [name] Birthdate: Feb 28, 1956 Sex: male Admission Date (Current Location): 10/11/2024  [facility] and [place] Number:  Chiropodist and Address:  [facility], [address], [place], [place] [address]      Provider Number: [id number]  Attending Physician Name and Address:  Josette Ade, MD  Relative Name and Phone Number:  Driver,Katherine  Spouse, Emergency Contact  [phone] (Mobile)    Current Level of Care: Hospital Recommended Level of Care: Skilled Nursing Facility Prior Approval Number:    Date Approved/Denied:   PASRR Number: [id number]  Discharge Plan: SNF    Current Diagnoses: Patient Active Problem List   Diagnosis Date Noted   C. difficile colitis 10/12/2024   Cellulitis of right lower extremity 10/11/2024   Hospital discharge follow-up 10/10/2024   Cellulitis of right lower leg 10/10/2024   Trichomonas infection 10/10/2024   Right wrist tendinitis 02/16/2023   Chronic pain disorder 01/24/2023   Arthritis of right wrist 11/02/2022   Arrhythmia 09/16/2022   Rotator cuff arthropathy, right 04/27/2022   Cervical spondylitis with radiculitis 03/21/2022   Aspermia 03/21/2022   Erectile dysfunction 03/21/2022   Prediabetes 12/15/2021   Lumbar radiculopathy 04/03/2021   Acquired thrombophilia 10/02/2020   Encounter for preventive health examination 05/31/2020   Frequent falls 07/01/2019   Urinary frequency 12/28/2017   Major depressive disorder with current active episode 09/26/2017   S/P total knee arthroplasty 06/20/2017   Preoperative evaluation to rule out surgical contraindication 08/02/2016   Chronic venous insufficiency 06/23/2016   Acquired hypothyroidism 04/04/2016   Long-term use of high-risk medication 03/22/2016   Hyperlipidemia 03/22/2016   Pain in joint, ankle and foot 12/04/2015   CHF (congestive heart failure)  (HCC) 05/21/2015   Bilateral leg edema 05/21/2015   OSA (obstructive sleep apnea)    History of DVT (deep vein thrombosis)    B12 deficiency    S/P left unicompartmental knee replacement 02/05/2015   Routine general medical examination at a health care facility 11/08/2014   Obesity, Class III, BMI 40-49.9 (morbid obesity) (HCC) 08/03/2014   Neck pain 05/13/2014   Rotator cuff insufficiency of left shoulder 05/13/2014   Weakness 05/06/2014   Essential hypertension 10/17/2013   Polyarthralgia 07/04/2013   Neuropathy 07/04/2013   S/P bariatric surgery 07/04/2013   Degenerative joint disease involving multiple joints on both sides of body 07/10/2012   Chronic atrial fibrillation with rapid ventricular response (HCC) 06/19/2012    Orientation RESPIRATION BLADDER Height & Weight     Self, Time, Situation, Place   (Needs a CPAP but refuses to use it.) Continent Weight: (!) 339 lb 11.7 oz (154.1 kg) Height:     BEHAVIORAL SYMPTOMS/MOOD NEUROLOGICAL BOWEL NUTRITION STATUS      Continent Diet (Diet Heart Room service appropriate? Yes; Fluid consistency: Thin: Cardiac starting at 01/01 1229)  AMBULATORY STATUS COMMUNICATION OF NEEDS Skin   Extensive Assist Verbally Skin abrasions, Bruising                       Personal Care Assistance Level of Assistance  Bathing, Dressing Bathing Assistance: Limited assistance   Dressing Assistance: Maximum assistance     Functional Limitations Info             SPECIAL CARE FACTORS FREQUENCY  PT (By licensed PT), OT (By licensed OT)     PT Frequency: 2x OT Frequency: 2x  Contractures Contractures Info: Not present    Additional Factors Info  Code Status, Allergies Code Status Info: Full Allergies Info: Eliquis ; Xarelto           Current Medications (10/13/2024):  This is the current hospital active medication list Current Facility-Administered Medications  Medication Dose Route Frequency Provider Last Rate Last  Admin   acetaminophen  (TYLENOL ) tablet 650 mg  650 mg Oral Q6H PRN Khan, Ghalib, MD   650 mg at 10/12/24 0501   Or   acetaminophen  (TYLENOL ) suppository 650 mg  650 mg Rectal Q6H PRN Fernand Prost, MD       hydrochlorothiazide  (HYDRODIURIL ) tablet 25 mg  25 mg Oral Daily Khan, Ghalib, MD   25 mg at 10/13/24 0845   levothyroxine  (SYNTHROID ) tablet 25 mcg  25 mcg Oral Q0600 Khan, Ghalib, MD   25 mcg at 10/13/24 9482   metoprolol  tartrate (LOPRESSOR ) tablet 100 mg  100 mg Oral BID Hammock, Tylene, NP       ondansetron  (ZOFRAN ) tablet 4 mg  4 mg Oral Q6H PRN Fernand Prost, MD       Or   ondansetron  (ZOFRAN ) injection 4 mg  4 mg Intravenous Q6H PRN Fernand Prost, MD       oxyCODONE  (Oxy IR/ROXICODONE ) immediate release tablet 5 mg  5 mg Oral Q4H PRN Khan, Ghalib, MD   5 mg at 10/11/24 2004   rosuvastatin  (CRESTOR ) tablet 10 mg  10 mg Oral Daily Fernand Prost, MD   10 mg at 10/13/24 0845   senna-docusate (Senokot-S) tablet 1 tablet  1 tablet Oral QHS PRN Fernand Prost, MD       sodium chloride  flush (NS) 0.9 % injection 3 mL  3 mL Intravenous Q12H Khan, Ghalib, MD   3 mL at 10/13/24 0845   vancomycin  (VANCOCIN ) capsule 125 mg  125 mg Oral QID Josette Ade, MD   125 mg at 10/13/24 1755   vancomycin  (VANCOREADY) IVPB 1500 mg/300 mL  1,500 mg Intravenous Q12H Lenon Elsie HERO, RPH 150 mL/hr at 10/13/24 0847 1,500 mg at 10/13/24 0847   venlafaxine  XR (EFFEXOR -XR) 24 hr capsule 300 mg  300 mg Oral Q breakfast Khan, Ghalib, MD   300 mg at 10/13/24 9155   Warfarin - Pharmacist Dosing Inpatient   Does not apply q1600 Lenon Elsie HERO, [facility]   Given at 10/13/24 1755     Discharge Medications: Please see discharge summary for a list of discharge medications.  Relevant Imaging Results:  Relevant Lab Results:   Additional Information [id number]  Abdi Husak L Cynithia Hakimi, LCSW     "

## 2024-10-13 NOTE — Progress Notes (Signed)
" °  Echocardiogram 2D Echocardiogram has been performed. Definity  IV ultrasound imaging agent used on this study.  Johnathan [name] 10/13/2024, 4:10 PM "

## 2024-10-13 NOTE — Assessment & Plan Note (Signed)
 His Toprol -XL was converted over to metoprolol  75 mg twice a day yesterday.  Increased to 100 mg twice a day today.  Patient on Coumadin  with INR slightly elevated.  Pharmacy dosing.

## 2024-10-13 NOTE — Progress Notes (Signed)
 "  Rounding Note   Patient Name: Johnathan [name] Date of Encounter: 10/13/2024  [facility] Cardiologist: Evalene Lunger, MD   Subjective Patient seen on a.m. rounds.  Denies any chest pain or changes shortness of breath.  Continues to refuse CPAP for OSA.  States that swelling to lower extremities is slowly improving.  Continues to remain in atrial fibrillation with rates better controlled overnight on increased dose of metoprolol  tartrate.  Scheduled Meds:  hydrochlorothiazide   25 mg Oral Daily   levothyroxine   25 mcg Oral Q0600   metoprolol  tartrate  75 mg Oral BID   rosuvastatin   10 mg Oral Daily   sodium chloride  flush  3 mL Intravenous Q12H   vancomycin   125 mg Oral QID   venlafaxine  XR  300 mg Oral Q breakfast   Warfarin - Pharmacist Dosing Inpatient   Does not apply q1600   Continuous Infusions:  vancomycin  Stopped (10/12/24 2206)   PRN Meds: acetaminophen  **OR** acetaminophen , ondansetron  **OR** ondansetron  (ZOFRAN ) IV, oxyCODONE , senna-docusate   Vital Signs  Vitals:   10/12/24 1454 10/12/24 1718 10/12/24 2012 10/13/24 0440  BP: 117/87 117/82 117/83 (!) 131/96  Pulse: (!) 106 95 (!) 106 90  Resp: 20 17 17 18   Temp: 98.6 F (37 C) 98.9 F (37.2 C) 99.3 F (37.4 C) 98.2 F (36.8 C)  TempSrc: Oral     SpO2: 97% 96% 96% 98%  Weight:        Intake/Output Summary (Last 24 hours) at 10/13/2024 0742 Last data filed at 10/13/2024 0544 Gross per 24 hour  Intake 548.96 ml  Output 300 ml  Net 248.96 ml      10/12/2024    5:01 AM 10/11/2024    4:49 PM 10/09/2024    2:49 PM  Last 3 Weights  Weight (lbs) 339 lb 11.7 oz 339 lb 8.1 oz 347 lb 6.4 oz  Weight (kg) 154.1 kg 154 kg 157.58 kg      Telemetry Atrial fibrillation that is rate controlled- Personally Reviewed  ECG  No new tracings- Personally Reviewed  Physical Exam  GEN: No acute distress.   Neck: Unable to determine JVD due to body habitus Cardiac: IR IR, no murmurs, rubs, or gallops.   Respiratory: Clear to auscultation bilaterally. GI: Soft, obese, nontender, non-distended  MS: 1+ right lower extremity and trace left lower extremity edema; No deformity. Neuro:  Nonfocal  Psych: Normal affect   Labs High Sensitivity Troponin:  No results for input(s): TROPONINIHS in the last 720 hours. No results for input(s): TRNPT in the last 720 hours.     Chemistry Recent Labs  Lab 10/11/24 1051 10/12/24 0510  NA 137 139  K 3.5 3.6  CL 101 102  CO2 24 24  GLUCOSE 122* 88  BUN 14 13  CREATININE 0.96 0.92  CALCIUM  8.7* 8.2*  MG 2.2  --   PROT 7.1  --   ALBUMIN 3.4*  --   AST 31  --   ALT 31  --   ALKPHOS 97  --   BILITOT 0.7  --   GFRNONAA >60 >60  ANIONGAP 12 13    Lipids No results for input(s): CHOL, TRIG, HDL, LABVLDL, LDLCALC, CHOLHDL in the last 168 hours.  Hematology Recent Labs  Lab 10/11/24 1051 10/12/24 0510  WBC 14.2* 11.6*  RBC 4.13* 3.88*  HGB 12.5* 12.0*  HCT 37.5* 36.0*  MCV 90.8 92.8  MCH 30.3 30.9  MCHC 33.3 33.3  RDW 13.6 14.0  PLT 292  274   Thyroid  No results for input(s): TSH, FREET4 in the last 168 hours.  BNPNo results for input(s): BNP, PROBNP in the last 168 hours.  DDimer No results for input(s): DDIMER in the last 168 hours.   Radiology  US  Venous Img Lower Bilateral (DVT) Result Date: 10/12/2024 CLINICAL DATA:  Lower extremity edema. EXAM: BILATERAL LOWER EXTREMITY VENOUS DOPPLER ULTRASOUND TECHNIQUE: Gray-scale sonography with graded compression, as well as color Doppler and duplex ultrasound were performed to evaluate the lower extremity deep venous systems from the level of the common femoral vein and including the common femoral, femoral, profunda femoral, popliteal and calf veins including the posterior tibial, peroneal and gastrocnemius veins when visible. Spectral Doppler was utilized to evaluate flow at rest and with distal augmentation maneuvers in the common femoral, femoral and popliteal veins.  COMPARISON:  None Available. FINDINGS: RIGHT LOWER EXTREMITY Common Femoral Vein: No evidence of thrombus. Normal compressibility, respiratory phasicity and response to augmentation. Saphenofemoral Junction: No evidence of thrombus. Normal compressibility and flow on color Doppler imaging. Profunda Femoral Vein: No evidence of thrombus. Normal compressibility and flow on color Doppler imaging. Femoral Vein: No evidence of thrombus. Normal compressibility, respiratory phasicity and response to augmentation. Popliteal Vein: No evidence of thrombus. Normal compressibility, respiratory phasicity and response to augmentation. Calf Veins: No evidence of thrombus. Normal compressibility and flow on color Doppler imaging. Other Findings:  None. LEFT LOWER EXTREMITY Common Femoral Vein: No evidence of thrombus. Normal compressibility, respiratory phasicity and response to augmentation. Saphenofemoral Junction: No evidence of thrombus. Normal compressibility and flow on color Doppler imaging. Profunda Femoral Vein: No evidence of thrombus. Normal compressibility and flow on color Doppler imaging. Femoral Vein: No evidence of thrombus. Normal compressibility, respiratory phasicity and response to augmentation. Popliteal Vein: No evidence of thrombus. Normal compressibility, respiratory phasicity and response to augmentation. Calf Veins: No evidence of thrombus. Normal compressibility and flow on color Doppler imaging. Other Findings:  None. IMPRESSION: No evidence of deep venous thrombosis in either lower extremity. Electronically Signed   By: Juliene Balder M.D.   On: 10/12/2024 11:28   DG Tibia/Fibula Right Result Date: 10/11/2024 CLINICAL DATA:  Right lower extremity cellulitis. EXAM: RIGHT TIBIA AND FIBULA - 2 VIEW COMPARISON:  Right knee radiograph dated 06/20/2017. 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 The arthroplasty components appear intact and in anatomic alignment. There is no acute fracture or  dislocation. Degenerative changes of the ankle. There is a small suprapatellar effusion. Mild diffuse subcutaneous edema. No opaque foreign object/gas. IMPRESSION: 1. No acute fracture or dislocation. 2. Small suprapatellar effusion. Electronically Signed   By: Vanetta Chou M.D.   On: 10/11/2024 13:55   DG Chest 2 View Result Date: 10/11/2024 EXAM: 2 VIEW(S) XRAY OF THE CHEST 10/11/2024 11:05:00 AM COMPARISON: CXR 12/22/2022. CLINICAL HISTORY: sob FINDINGS: LUNGS AND PLEURA: Low lung volumes. No focal pulmonary opacity. No pleural effusion. No pneumothorax. HEART AND MEDIASTINUM: Unchanged cardiomediastinal silhouette. BONES AND SOFT TISSUES: Partially imaged right shoulder arthroplasty noted. Thoracic spondylosis. No acute osseous abnormality. IMPRESSION: 1. Low lung volumes. Electronically signed by: Morgane Naveau MD 10/11/2024 12:22 PM EST RP Workstation: [id number]    Cardiac Studies Echocardiogram ordered and pending  Patient Profile   69 y.o. male with a history of persistent atrial fibrillation originally diagnosed in 05/2012, morbid obesity, hypertension, history of DVT (right lower extremity, 2004), OSA not on CPAP, chronic joint pain, chronic HFmrEF/dilated cardiomyopathy, hypothyroidism, chronic venous insufficiency, who is being seen and evaluated for atrial fibrillation with  RVR.  Assessment & Plan  Persistent atrial fibrillation -Initially diagnosed with atrial fibrillation in 2013, cardioverted x 1, presented back to the hospital in 2016 from a tick bite and was found to be back in atrial fibrillation, he is noted to be in paroxysmal atrial fibrillation until around July 2024 and since that time review of EKGs he has maintained atrial fibrillation -Previously in July 2024 discussed therapeutic INRs for potential repeat cardioversion procedure patient unfortunately is not compliant with follow-ups in the Coumadin  clinic -He has been continued on warfarin with ongoing monitoring dose  adjustment per pharmacy protocol with daily INR -INR this morning 3.6 -Significant improvement in rate control with 75 mg of metoprolol  to tartrate twice daily, dose increased to 100 mg twice daily -Heart rates ranging from upper 90s to 120 bpm at rest -Continue with telemetry monitoring  Cellulitis of the right lower extremity -Previously managed to [place] -IV antibiotic therapy per IM -Wound consultation completed recommended compression wraps and leg elevation -Ongoing management per IM  HFmrEF/dilated cardiomyopathy -Last LVEF of 40-45% -Endorses chronic shortness of breath that is unchanged -Echocardiogram ordered and pending with further recommendations to follow -Toprol -XL was changed to metoprolol  tartrate for better rate control atrial fibrillation -Volume status difficult to assess due to body habitus -Continue with daily weights and I's and O's  C. difficile colitis -Stool samples positive for C. Difficile -Oral antibiotic treatment for 10 days with vancomycin  -Ongoing management per IM  Primary hypertension -Blood pressure 126/90 -Continue all current medication regimen -Vital signs per unit protocol  Hyperlipidemia -LDL 50 -Continue on statin therapy  Obstructive sleep apnea -Continues to decline CPAP therapy -Ongoing management per IM  Obesity -BMI of 42.46 -Weight loss would be beneficial   For questions or updates, please contact [facility] Please consult [email or web address] for contact info under       Signed, Jezreel Sisk, NP  10/13/2024, 7:42 AM    "

## 2024-10-13 NOTE — Progress Notes (Signed)
 PHARMACY - ANTICOAGULATION CONSULT NOTE  Pharmacy Consult for Warfarin Indication: atrial fibrillation  Allergies[1]  Patient Measurements: Weight: (!) 154.1 kg (339 lb 11.7 oz)  Vital Signs: Temp: 98.3 F (36.8 C) (01/03 0743) Temp Source: Oral (01/03 0743) BP: 126/90 (01/03 0743) Pulse Rate: 108 (01/03 0743)  Labs: Recent Labs    10/11/24 1051 10/11/24 1142 10/12/24 0510 10/13/24 0543  HGB 12.5*  --  12.0*  --   HCT 37.5*  --  36.0*  --   PLT 292  --  274  --   LABPROT  --  34.8* 37.2* 37.5*  INR  --  3.3* 3.6* 3.6*  CREATININE 0.96  --  0.92  --   CKTOTAL 182  --   --   --     Estimated Creatinine Clearance: 120.4 mL/min (by C-G formula based on SCr of 0.92 mg/dL).   Medical History: Past Medical History:  Diagnosis Date   Arthritis    a. chronic joint pain   B12 deficiency    Chronic systolic CHF (congestive heart failure) (HCC)    a. echo 2013: EF of 50-55%, normal right ventricular systolic pressure, normal left atrium; b. EF 40-45%, inadequate for LV wall motion, mild MR, LA severely dilated @ 52 mm, PASP nl   Complication of anesthesia    Dysrhythmia    Erythema migrans (Lyme disease) 04/08/2015   History of DVT (deep vein thrombosis) 2004   s/p bariatric surgery   History of stress test    a. there was no ST segment deviation noted during stress,    There is a small defect of moderate severity present in the apex location, suggestive of apical ischemia, intermediate risk, calculated EF 21% but visually appears to be 35-40%   Hypertension    Hypothyroidism    Neuropathy    Obesity    OSA (obstructive sleep apnea)    a. not compliant with CPAP   PAF (paroxysmal atrial fibrillation) (HCC)    a. CHADSVASc at least 2 (HTN and vascular disease); b. on Eliquis     Pre-diabetes     Medications:  Warfarin home regimen is 7.5mg  on Mon and Fri, 5mg  all other days. Last dose 1/1  Assessment: 69 y.o. male with PMH including venous insufficiency, bilateral  LEE, neuropathy, right knee arthroplasty, Afib on warfarin is presenting with recurrent cellulitis. Pharmacy has been consulted to dose warfarin while inpatient.  Home dose: 7.5mg  on Mon and Fri, 5mg  all other days Last dose: 1/1 Goal INR: 2-3 Baseline INR: 3.3  Drug interactions: venlafaxine  (increased risk of bleeding)  Date INR Warfarin Dose  1/1 3.3 Patient took dose at home  1/2 3.6 HOLD  1/3 3.6     Goal of Therapy:  INR 2-3 Monitor platelets by anticoagulation protocol: Yes   Plan:  --INR is slightly supratherapeutic and stable. Will give warfarin 2.5 mg today which is 1/2 of home dose --Daily INR per protocol. Continue to monitor CBC per protocol and for signs and symptoms of bleeding  Marolyn KATHEE Mare 10/13/2024,11:09 AM    [1]  Allergies Allergen Reactions   Eliquis  [Apixaban ] Itching    Red spots and severe itching   Xarelto [Rivaroxaban] Hives    Reaction in 2013

## 2024-10-14 DIAGNOSIS — I420 Dilated cardiomyopathy: Secondary | ICD-10-CM | POA: Diagnosis not present

## 2024-10-14 DIAGNOSIS — I4891 Unspecified atrial fibrillation: Secondary | ICD-10-CM | POA: Diagnosis not present

## 2024-10-14 DIAGNOSIS — I5022 Chronic systolic (congestive) heart failure: Secondary | ICD-10-CM

## 2024-10-14 DIAGNOSIS — I482 Chronic atrial fibrillation, unspecified: Secondary | ICD-10-CM | POA: Diagnosis not present

## 2024-10-14 DIAGNOSIS — L03115 Cellulitis of right lower limb: Secondary | ICD-10-CM | POA: Diagnosis not present

## 2024-10-14 DIAGNOSIS — A0472 Enterocolitis due to Clostridium difficile, not specified as recurrent: Secondary | ICD-10-CM | POA: Diagnosis not present

## 2024-10-14 LAB — CBC
HCT: 36.9 % — ABNORMAL LOW (ref 39.0–52.0)
Hemoglobin: 12.4 g/dL — ABNORMAL LOW (ref 13.0–17.0)
MCH: 30.4 pg (ref 26.0–34.0)
MCHC: 33.6 g/dL (ref 30.0–36.0)
MCV: 90.4 fL (ref 80.0–100.0)
Platelets: 292 K/uL (ref 150–400)
RBC: 4.08 MIL/uL — ABNORMAL LOW (ref 4.22–5.81)
RDW: 13.8 % (ref 11.5–15.5)
WBC: 9.4 K/uL (ref 4.0–10.5)
nRBC: 0 % (ref 0.0–0.2)

## 2024-10-14 LAB — CREATININE, SERUM
Creatinine, Ser: 0.9 mg/dL (ref 0.61–1.24)
GFR, Estimated: >60 mL/min

## 2024-10-14 LAB — GLUCOSE, CAPILLARY: Glucose-Capillary: 92 mg/dL (ref 70–99)

## 2024-10-14 LAB — PROTIME-INR
INR: 3.4 — ABNORMAL HIGH (ref 0.8–1.2)
Prothrombin Time: 36.1 s — ABNORMAL HIGH (ref 11.4–15.2)

## 2024-10-14 MED ORDER — SODIUM CHLORIDE 0.9 % IV SOLN
100.0000 mg | Freq: Two times a day (BID) | INTRAVENOUS | Status: DC
  Administered 2024-10-14: 100 mg via INTRAVENOUS
  Filled 2024-10-14 (×2): qty 100

## 2024-10-14 MED ORDER — WARFARIN SODIUM 5 MG PO TABS
5.0000 mg | ORAL_TABLET | Freq: Once | ORAL | Status: AC
  Administered 2024-10-14: 5 mg via ORAL
  Filled 2024-10-14: qty 1

## 2024-10-14 MED ORDER — HYDROCODONE-ACETAMINOPHEN 10-325 MG PO TABS: 1.0000 | ORAL_TABLET | Freq: Three times a day (TID) | ORAL | 0 refills | Status: DC | PRN

## 2024-10-14 MED ORDER — SACUBITRIL-VALSARTAN 24-26 MG PO TABS
1.0000 | ORAL_TABLET | Freq: Two times a day (BID) | ORAL | Status: DC
  Administered 2024-10-14 – 2024-10-16 (×5): 1 via ORAL
  Filled 2024-10-14 (×6): qty 1

## 2024-10-14 NOTE — Progress Notes (Signed)
 PHARMACY - ANTICOAGULATION CONSULT NOTE  Pharmacy Consult for Warfarin Indication: atrial fibrillation  Allergies[1]  Patient Measurements: Weight: (!) 154.1 kg (339 lb 11.7 oz)  Vital Signs: Temp: 98.5 F (36.9 C) (01/04 0225) Temp Source: Oral (01/04 0225) BP: 139/86 (01/04 0225) Pulse Rate: 105 (01/04 0225)  Labs: Recent Labs    10/11/24 1051 10/11/24 1142 10/12/24 0510 10/13/24 0543 10/13/24 1400 10/14/24 0524  HGB 12.5*  --  12.0*  --   --  12.4*  HCT 37.5*  --  36.0*  --   --  36.9*  PLT 292  --  274  --   --  292  LABPROT  --    < > 37.2* 37.5*  --  36.1*  INR  --    < > 3.6* 3.6*  --  3.4*  CREATININE 0.96  --  0.92  --  0.85 0.90  CKTOTAL 182  --   --   --   --   --    < > = values in this interval not displayed.    Estimated Creatinine Clearance: 123 mL/min (by C-G formula based on SCr of 0.9 mg/dL).   Medical History: Past Medical History:  Diagnosis Date   Arthritis    a. chronic joint pain   B12 deficiency    Chronic systolic CHF (congestive heart failure) (HCC)    a. echo 2013: EF of 50-55%, normal right ventricular systolic pressure, normal left atrium; b. EF 40-45%, inadequate for LV wall motion, mild MR, LA severely dilated @ 52 mm, PASP nl   Complication of anesthesia    Dysrhythmia    Erythema migrans (Lyme disease) 04/08/2015   History of DVT (deep vein thrombosis) 2004   s/p bariatric surgery   History of stress test    a. there was no ST segment deviation noted during stress,    There is a small defect of moderate severity present in the apex location, suggestive of apical ischemia, intermediate risk, calculated EF 21% but visually appears to be 35-40%   Hypertension    Hypothyroidism    Neuropathy    Obesity    OSA (obstructive sleep apnea)    a. not compliant with CPAP   PAF (paroxysmal atrial fibrillation) (HCC)    a. CHADSVASc at least 2 (HTN and vascular disease); b. on Eliquis     Pre-diabetes     Medications:  Warfarin  home regimen is 7.5mg  on Mon and Fri, 5mg  all other days. Last dose 1/1  Assessment: 69 y.o. male with PMH including venous insufficiency, bilateral LEE, neuropathy, right knee arthroplasty, Afib on warfarin is presenting with recurrent cellulitis. Pharmacy has been consulted to dose warfarin while inpatient.  Home dose: 7.5mg  on Mon and Fri, 5mg  all other days Last dose: 1/1 Goal INR: 2-3 Baseline INR: 3.3  Drug interactions: venlafaxine  (increased risk of bleeding)  Date INR Warfarin Dose  1/1 3.3 Patient took dose at home  1/2 3.6 HOLD  1/3 3.6 2.5 mg  1/4 3.4 5 mg    Goal of Therapy:  INR 2-3 Monitor platelets by anticoagulation protocol: Yes   Plan:  --INR is slightly supratherapeutic and down-trending. Will re-start home warfarin regimen and give 5 mg tonight --Daily INR per protocol. Continue to monitor CBC per protocol and for signs and symptoms of bleeding  Marolyn KATHEE Mare 10/14/2024,6:58 AM    [1]  Allergies Allergen Reactions   Eliquis  [Apixaban ] Itching    Red spots and severe itching   Xarelto [Rivaroxaban]  Hives    Reaction in 2013

## 2024-10-14 NOTE — Plan of Care (Signed)

## 2024-10-14 NOTE — Assessment & Plan Note (Signed)
 EF 35 to 40%.  Patient on metoprolol , switched over to Entresto .  May need to start Jardiance or Farxiga.

## 2024-10-14 NOTE — Progress Notes (Signed)
 "  Rounding Note   Patient Name: Johnathan [name] Date of Encounter: 10/14/2024  [facility] Cardiologist: Evalene Lunger, MD   Subjective Patient seen on a.m. rounds.  Denies any chest pain or changes shortness of breath.  Remains in atrial fibrillation but rates are better controlled on increased dose of metoprolol .  Echocardiogram was completed with slightly reduced EF from previous studies.  Legs have been wrapped and he states that he is waiting placement for rehab.  Scheduled Meds:  hydrochlorothiazide   25 mg Oral Daily   levothyroxine   25 mcg Oral Q0600   metoprolol  tartrate  100 mg Oral BID   rosuvastatin   10 mg Oral Daily   sodium chloride  flush  3 mL Intravenous Q12H   vancomycin   125 mg Oral QID   venlafaxine  XR  300 mg Oral Q breakfast   warfarin  5 mg Oral ONCE-1600   Warfarin - Pharmacist Dosing Inpatient   Does not apply q1600   Continuous Infusions:  doxycycline  (VIBRAMYCIN ) IV     PRN Meds: acetaminophen  **OR** acetaminophen , ondansetron  **OR** ondansetron  (ZOFRAN ) IV, oxyCODONE , senna-docusate   Vital Signs  Vitals:   10/13/24 1605 10/13/24 2000 10/14/24 0225 10/14/24 0816  BP: 123/86 (!) 126/93 139/86 (!) 140/106  Pulse: 94 (!) 104 (!) 105 85  Resp: 16 18 19 18   Temp: 98.9 F (37.2 C) 98.8 F (37.1 C) 98.5 F (36.9 C) 97.9 F (36.6 C)  TempSrc: Oral Oral Oral   SpO2: 98% 96% 94% 94%  Weight:        Intake/Output Summary (Last 24 hours) at 10/14/2024 0935 Last data filed at 10/14/2024 0300 Gross per 24 hour  Intake 120 ml  Output 925 ml  Net -805 ml      10/12/2024    5:01 AM 10/11/2024    4:49 PM 10/09/2024    2:49 PM  Last 3 Weights  Weight (lbs) 339 lb 11.7 oz 339 lb 8.1 oz 347 lb 6.4 oz  Weight (kg) 154.1 kg 154 kg 157.58 kg      Telemetry Atrial fibrillation that is rate controlled- Personally Reviewed  ECG  No new tracings- Personally Reviewed  Physical Exam  GEN: No acute distress.   Neck: Unable to determine JVD due to  body habitus Cardiac: IR IR, no murmurs, rubs, or gallops.  Respiratory: Clear to auscultation bilaterally. GI: Soft, obese, nontender, non-distended  MS: Right lower extremities with wraps and elevated on pillows trace left lower extremity edema; No deformity. Neuro:  Nonfocal  Psych: Normal affect   Labs High Sensitivity Troponin:  No results for input(s): TROPONINIHS in the last 720 hours. No results for input(s): TRNPT in the last 720 hours.     Chemistry Recent Labs  Lab 10/11/24 1051 10/12/24 0510 10/13/24 1400 10/14/24 0524  NA 137 139  --   --   K 3.5 3.6  --   --   CL 101 102  --   --   CO2 24 24  --   --   GLUCOSE 122* 88  --   --   BUN 14 13  --   --   CREATININE 0.96 0.92 0.85 0.90  CALCIUM  8.7* 8.2*  --   --   MG 2.2  --   --   --   PROT 7.1  --   --   --   ALBUMIN 3.4*  --   --   --   AST 31  --   --   --  ALT 31  --   --   --   ALKPHOS 97  --   --   --   BILITOT 0.7  --   --   --   GFRNONAA >60 >60 >60 >60  ANIONGAP 12 13  --   --     Lipids No results for input(s): CHOL, TRIG, HDL, LABVLDL, LDLCALC, CHOLHDL in the last 168 hours.  Hematology Recent Labs  Lab 10/11/24 1051 10/12/24 0510 10/14/24 0524  WBC 14.2* 11.6* 9.4  RBC 4.13* 3.88* 4.08*  HGB 12.5* 12.0* 12.4*  HCT 37.5* 36.0* 36.9*  MCV 90.8 92.8 90.4  MCH 30.3 30.9 30.4  MCHC 33.3 33.3 33.6  RDW 13.6 14.0 13.8  PLT 292 274 292   Thyroid  No results for input(s): TSH, FREET4 in the last 168 hours.  BNPNo results for input(s): BNP, PROBNP in the last 168 hours.  DDimer No results for input(s): DDIMER in the last 168 hours.   Radiology    Cardiac Studies 2d echo 10/13/2024 1. Left ventricular ejection fraction, by estimation, is 35 to 40%. The  left ventricle has moderately decreased function. The left ventricle  demonstrates global hypokinesis. The left ventricular internal cavity size  was moderately to severely dilated.  There is mild concentric left  ventricular hypertrophy. Left ventricular  diastolic parameters are indeterminate.   2. Right ventricular systolic function is normal. The right ventricular  size is normal.   3. Left atrial size was mildly dilated.   4. The mitral valve is normal in structure. Mild mitral valve  regurgitation. No evidence of mitral stenosis.   5. The aortic valve was not well visualized. Aortic valve regurgitation  is not visualized. Aortic valve sclerosis/calcification is present,  without any evidence of aortic stenosis.   Patient Profile   69 y.o. male with a history of persistent atrial fibrillation originally diagnosed in 05/2012, morbid obesity, hypertension, history of DVT (right lower extremity, 2004), OSA not on CPAP, chronic joint pain, chronic HFmrEF/dilated cardiomyopathy, hypothyroidism, chronic venous insufficiency, who is being seen and evaluated for atrial fibrillation with RVR.  Assessment & Plan  Persistent atrial fibrillation -Initially diagnosed with atrial fibrillation in 2013, cardioverted x 1, presented back to the hospital in 2016 from a tick bite and was found to be back in atrial fibrillation, he is noted to be in paroxysmal atrial fibrillation until around July 2024 and since that time review of EKGs he has maintained atrial fibrillation - -Previously in July 2024 discussed therapeutic INRs for potential repeat cardioversion procedure patient unfortunately is not compliant with follow-ups in the Coumadin  clinic -He has been continued on warfarin with ongoing monitoring dose adjustment per pharmacy protocol with daily INR -INR this morning 3.4 -Continued on metoprolol  tartrate 100 mg twice a daily -Heart rates ranging from upper 90s to 120 bpm at rest -Continue with telemetry monitoring  Cellulitis of the right lower extremity -Previously managed to [place] -IV antibiotic therapy per IM -Wound consultation completed recommended compression wraps and leg elevation -Ongoing management  per IM  HFrEF/dilated cardiomyopathy -Last LVEF of 40-45% -Endorses chronic shortness of breath that is unchanged -Echocardiogram completed reveals an LVEF of 35-40%, possibly tachycardia mediated from his atrial fibrillation, will need to consolidate his metoprolol  tartrate back to XL prior to discharge and escalate GDMT -With elevated blood pressures this morning recommend starting Entresto  24/26 mg twice daily -Toprol -XL was changed to metoprolol  tartrate for better rate control atrial fibrillation - -1.1 L output in the last 24 hours -  Volume status difficult to assess due to body habitus -Continue with daily weights and I's and O's  C. difficile colitis -Stool samples positive for C. Difficile -Oral antibiotic treatment for 10 days with vancomycin  -Ongoing management per IM  Primary hypertension -Blood pressure 139/86 -Continue all current medication regimen -Vital signs per unit protocol  Hyperlipidemia -LDL 50 -Continue on statin therapy  Obstructive sleep apnea -Continues to decline CPAP therapy -Ongoing management per IM  Obesity -BMI of 42.46 -Weight loss would be beneficial   For questions or updates, please contact [facility] Please consult [email or web address] for contact info under       Signed, Anda Sobotta, NP  10/14/2024, 9:35 AM    "

## 2024-10-14 NOTE — Plan of Care (Signed)

## 2024-10-14 NOTE — Progress Notes (Signed)
 " Progress Note   Patient: Johnathan [name] FMW:[id number] DOB: 1956/06/09 DOA: 10/11/2024     2 DOS: the patient was seen and examined on 10/14/2024   Brief hospital course: 70 y.o. year old male with medical history of hypertension, hyperlipidemia, prediabetes, CHF, atrial fibrillation presenting to the ED with weakness and malaise along with worsening right lower extremity erythema.  Reports decreased p.o. intake along with diarrhea since leaving the hospital.  Patient was hospitalized from 12/18 - 12-25 for right lower extremity cellulitis.  He was discharged on Augmentin for 10 days.  States cellulitis was extensive and improved with IV antibiotics.  On arrival to the ED patient was noted to be HDS stable.  Lab work and imaging obtained.  CBC with leukocytosis at 14.2, mild anemia at 12.5-normocytic.  CMP with normal renal and hepatic function mild hypoalbuminemia.  Lactic acid normal.  INR at 3.3.  Chest x-ray without any acute findings.  Patient was noted to be initially in A-fib with RVR but responded to diltiazem  injection.  Given patient's symptoms, [facility] contacted for admission.   10/12/2024.  Patient had an episode of diarrhea so I sent off a stool for C. difficile and that is positive.  Added p.o. vancomycin  also.  Appreciate wound care nurse for compression wraps.  Leg elevation for cellulitis.  Recent hospitalization at [facility] with Staph hominis growing out of culture and was sent home on Augmentin. 10/13/2024.  Metoprolol  increased to 100 mg twice daily 10/14/2024.  While I was in the room patient needed to go to the bathroom and had a stage VI mushy stool.  Assessment and Plan: * C. difficile colitis Continue p.o. vancomycin .  Patient had a stage VI stool while was in the room.  Needed to get to the bathroom quickly.  Had part of the bowel movement in the bed.  Cellulitis of right lower extremity Staph hominis growing out of culture from [facility].  Was sent home on Augmentin.  On IV vancomycin   initially here, will switch over to doxycycline .  Compression wraps ordered.  Elevation right leg.  Chronic atrial fibrillation with rapid ventricular response (HCC) His Toprol -XL was converted over to metoprolol  75 mg twice a day on 1/2.  Increased to 100 mg twice a day on 1/3.  Patient on Coumadin  with INR slightly elevated.  Pharmacy dosing.  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  Patient on metoprolol , switched over to Entresto .  May need to start Jardiance or Farxiga.  Acquired hypothyroidism On levothyroxine   Hyperlipidemia On Crestor   OSA (obstructive sleep apnea) Does not wear CPAP  Obesity, Class III, BMI 40-49.9 (morbid obesity) (HCC) BMI 42.46  Weakness PT and OT recommending rehab  Essential hypertension Continue increased dose of metoprolol , Entresto .  Continue hydrochlorothiazide         Subjective: Patient needed to go to the bathroom was in the room.  Had part of the bowel movement in the bed and the rest in the bathroom.  Came back to evaluate and is feeling okay.  Had numerous bowel movements.  Continue p.o. vancomycin  for C. difficile colitis.  Physical Exam: Vitals:   10/13/24 1605 10/13/24 2000 10/14/24 0225 10/14/24 0816  BP: 123/86 (!) 126/93 139/86 (!) 140/106  Pulse: 94 (!) 104 (!) 105 85  Resp: 16 18 19 18   Temp: 98.9 F (37.2 C) 98.8 F (37.1 C) 98.5 F (36.9 C) 97.9 F (36.6 C)  TempSrc: Oral Oral Oral   SpO2: 98%  96% 94% 94%  Weight:       Physical Exam HENT:     Head: Normocephalic.  Eyes:     General: Lids are normal.  Cardiovascular:     Rate and Rhythm: Tachycardia present. Rhythm irregularly irregular.     Heart sounds: Normal heart sounds, S1 normal and S2 normal.  Pulmonary:     Breath sounds: Examination of the right-lower field reveals decreased breath sounds. Examination of the left-lower field reveals decreased breath sounds. Decreased breath sounds present. No wheezing,  rhonchi or rales.  Abdominal:     Palpations: Abdomen is soft.     Tenderness: There is no abdominal tenderness.  Musculoskeletal:     Right lower leg: Swelling present.     Left lower leg: Swelling present.  Skin:    General: Skin is warm.  Neurological:     Mental Status: He is alert and oriented to person, place, and time.     Data Reviewed: EF 35 to 40%, LVH Creatinine 0.9, white blood cell count 9.4, hemoglobin 12.4, platelet count 292, INR 3.4  Family Communication: Updated wife on the phone  Disposition: Status is: Inpatient Remains inpatient appropriate because: TOC looking into rehab.  P.o. vancomycin  for C. difficile colitis.  Planned Discharge Destination: Rehab    Time spent: 28 minutes  Author: Charlie Patterson, MD 10/14/2024 2:12 PM  For on call review [email or web address]  "

## 2024-10-15 ENCOUNTER — Other Ambulatory Visit (HOSPITAL_COMMUNITY): Payer: Self-pay

## 2024-10-15 ENCOUNTER — Telehealth (HOSPITAL_COMMUNITY): Payer: Self-pay | Admitting: Pharmacy Technician

## 2024-10-15 DIAGNOSIS — A0472 Enterocolitis due to Clostridium difficile, not specified as recurrent: Secondary | ICD-10-CM | POA: Diagnosis not present

## 2024-10-15 DIAGNOSIS — L03115 Cellulitis of right lower limb: Secondary | ICD-10-CM | POA: Diagnosis not present

## 2024-10-15 DIAGNOSIS — I4891 Unspecified atrial fibrillation: Secondary | ICD-10-CM

## 2024-10-15 DIAGNOSIS — I482 Chronic atrial fibrillation, unspecified: Secondary | ICD-10-CM | POA: Diagnosis not present

## 2024-10-15 DIAGNOSIS — I5022 Chronic systolic (congestive) heart failure: Secondary | ICD-10-CM | POA: Diagnosis not present

## 2024-10-15 LAB — GLUCOSE, CAPILLARY: Glucose-Capillary: 98 mg/dL (ref 70–99)

## 2024-10-15 LAB — BASIC METABOLIC PANEL WITH GFR
Anion gap: 15 (ref 5–15)
BUN: 11 mg/dL (ref 8–23)
CO2: 23 mmol/L (ref 22–32)
Calcium: 8.8 mg/dL — ABNORMAL LOW (ref 8.9–10.3)
Chloride: 101 mmol/L (ref 98–111)
Creatinine, Ser: 0.85 mg/dL (ref 0.61–1.24)
GFR, Estimated: >60 mL/min
Glucose, Bld: 92 mg/dL (ref 70–99)
Potassium: 3 mmol/L — ABNORMAL LOW (ref 3.5–5.1)
Sodium: 139 mmol/L (ref 135–145)

## 2024-10-15 LAB — PROTIME-INR
INR: 3.6 — ABNORMAL HIGH (ref 0.8–1.2)
Prothrombin Time: 37.8 s — ABNORMAL HIGH (ref 11.4–15.2)

## 2024-10-15 LAB — MAGNESIUM: Magnesium: 1.9 mg/dL (ref 1.7–2.4)

## 2024-10-15 MED ORDER — POTASSIUM CHLORIDE CRYS ER 20 MEQ PO TBCR
40.0000 meq | EXTENDED_RELEASE_TABLET | Freq: Once | ORAL | Status: AC
  Administered 2024-10-15: 40 meq via ORAL
  Filled 2024-10-15: qty 2

## 2024-10-15 MED ORDER — SPIRONOLACTONE 25 MG PO TABS
25.0000 mg | ORAL_TABLET | Freq: Every day | ORAL | Status: DC
  Administered 2024-10-15 – 2024-10-16 (×2): 25 mg via ORAL
  Filled 2024-10-15 (×2): qty 1

## 2024-10-15 MED ORDER — DOXYCYCLINE HYCLATE 100 MG PO TABS
100.0000 mg | ORAL_TABLET | Freq: Two times a day (BID) | ORAL | Status: DC
  Administered 2024-10-15 – 2024-10-16 (×3): 100 mg via ORAL
  Filled 2024-10-15 (×3): qty 1

## 2024-10-15 MED ORDER — METOPROLOL SUCCINATE ER 50 MG PO TB24
100.0000 mg | ORAL_TABLET | Freq: Two times a day (BID) | ORAL | Status: DC
  Administered 2024-10-15 – 2024-10-16 (×2): 100 mg via ORAL
  Filled 2024-10-15 (×2): qty 2

## 2024-10-15 MED ORDER — FUROSEMIDE 20 MG PO TABS: 20.0000 mg | ORAL_TABLET | Freq: Every day | ORAL | Status: DC

## 2024-10-15 MED ORDER — METOPROLOL SUCCINATE ER 50 MG PO TB24
100.0000 mg | ORAL_TABLET | Freq: Two times a day (BID) | ORAL | Status: DC
  Filled 2024-10-15: qty 2

## 2024-10-15 NOTE — Progress Notes (Signed)
 Heart Failure Nurse Navigator Progress Note  PCP: Marylynn Verneita CROME, MD PCP-Cardiologist: Evalene Lunger, MD Admission Diagnosis: Cellulitis of right lower extremity Atrial fibrillation with RVR [facility]) Admitted from: Home   Presentation:   Johnathan [name] is a 69 y.o male.  He presented with concern of weakness.  Recently admitted to [facility] for cellulitis and put on Zosyn  secondary to sepsis.  Had severe diarrhea and poor p.o. intake.  Had been having increased weakness and falls.  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 Chest x-ray-low lung volumes. BNP, HS-Troponin not measured on admission.    ECHO/ LVEF: 35-40%  Clinical Course:  Past Medical History:  Diagnosis Date   Arthritis    a. chronic joint pain   B12 deficiency    Chronic systolic CHF (congestive heart failure) (HCC)    a. echo 2013: EF of 50-55%, normal right ventricular systolic pressure, normal left atrium; b. EF 40-45%, inadequate for LV wall motion, mild MR, LA severely dilated @ 52 mm, PASP nl   Complication of anesthesia    Dysrhythmia    Erythema migrans (Lyme disease) 04/08/2015   History of DVT (deep vein thrombosis) 2004   s/p bariatric surgery   History of stress test    a. there was no ST segment deviation noted during stress,    There is a small defect of moderate severity present in the apex location, suggestive of apical ischemia, intermediate risk, calculated EF 21% but visually appears to be 35-40%   Hypertension    Hypothyroidism    Neuropathy    Obesity    OSA (obstructive sleep apnea)    a. not compliant with CPAP   PAF (paroxysmal atrial fibrillation) (HCC)    a. CHADSVASc at least 2 (HTN and vascular disease); b. on Eliquis     Pre-diabetes      Social History   Socioeconomic History   Marital status: Married    Spouse name: Not on file   Number of children:  Not on file   Years of education: Not on file   Highest education level: Not on file  Occupational History   Not on file  Tobacco Use   Smoking status: Never    Passive exposure: Never   Smokeless tobacco: Never  Vaping Use   Vaping status: Never Used  Substance and Sexual Activity   Alcohol use: Not Currently    Alcohol/week: 2.0 standard drinks of alcohol    Types: 2 Shots of liquor per week    Comment: Vodka and lemonade occasionally 2-3/week    Drug use: Yes    Comment: gummies delta A   Sexual activity: Yes    Partners: Female    Comment: Wife  Other Topics Concern   Not on file  Social History Narrative   Currently between jobs   Lives with Wife   1 Son (33) and 1 daughter (18)    2 grandchildren by daughter    1 dog and 1 cat    Enjoys gardening, movies, dining out.       Are you right handed or left handed? Right Handed    Are you currently employed ? No   What is your current occupation? Retired    Do you live at home alone? No    Who lives with you? Wife    What type of home do you live in: 1 story  or 2 story? One story home       Social Drivers of Health   Tobacco Use: Low Risk (10/11/2024)   Patient History    Smoking Tobacco Use: Never    Smokeless Tobacco Use: Never    Passive Exposure: Never  Financial Resource Strain: Low Risk (09/04/2024)   Overall Financial Resource Strain (CARDIA)    Difficulty of Paying Living Expenses: Not hard at all  Food Insecurity: No Food Insecurity (10/11/2024)   Epic    Worried About Programme Researcher, Broadcasting/film/video in the Last Year: Never true    Ran Out of Food in the Last Year: Never true  Transportation Needs: No Transportation Needs (10/11/2024)   Epic    Lack of Transportation (Medical): No    Lack of Transportation (Non-Medical): No  Physical Activity: Insufficiently Active (09/04/2024)   Exercise Vital Sign    Days of Exercise per Week: 3 days    Minutes of Exercise per Session: 40 min  Stress: No Stress Concern Present  (09/04/2024)   [employer] of Occupational Health - Occupational Stress Questionnaire    Feeling of Stress: Only a little  Social Connections: Moderately Isolated (10/11/2024)   Social Connection and Isolation Panel    Frequency of Communication with Friends and Family: Never    Frequency of Social Gatherings with Friends and Family: Never    Attends Religious Services: Never    Database Administrator or Organizations: Yes    Attends Engineer, Structural: More than 4 times per year    Marital Status: Married  Depression (PHQ2-9): Medium Risk (10/09/2024)   Depression (PHQ2-9)    PHQ-2 Score: 7  Alcohol Screen: Low Risk (09/04/2024)   Alcohol Screen    Last Alcohol Screening Score (AUDIT): 0  Housing: Low Risk (10/11/2024)   Epic    Unable to Pay for Housing in the Last Year: No    Number of Times Moved in the Last Year: 0    Homeless in the Last Year: No  Utilities: Not At Risk (10/11/2024)   Epic    Threatened with loss of utilities: No  Health Literacy: Adequate Health Literacy (09/04/2024)   B1300 Health Literacy    Frequency of need for help with medical instructions: Never   Education Assessment and Provision:  Detailed education and instructions provided on heart failure disease management including the following:  Signs and symptoms of Heart Failure When to call the physician Importance of daily weights Low sodium diet Fluid restriction Medication management Anticipated future follow-up appointments  Patient education given on each of the above topics.  Patient acknowledges understanding via teach back method and acceptance of all instructions.  Education Materials:  Living Better With Heart Failure Booklet, HF zone tool, & Daily Weight Tracker Tool.  Patient has scale at home: Yes Patient has pill box at home: Yes    High Risk Criteria for Readmission and/or Poor Patient Outcomes: Heart failure hospital admissions (last 6 months): 1  No Show rate:  4% Difficult social situation: None determined Demonstrates medication adherence: Yes Primary Language: English Literacy level: Reading, Writing & Comprehension  Barriers of Care:   Daily Weights Diet & Fluid Restrictions  Considerations/Referrals:  Referral made to Heart Failure Pharmacist Stewardship: Yes Referral made to Heart Failure CSW/NCM TOC: No Referral made to Heart & Vascular TOC clinic: Yes.  CHF Clinic [facility]  11/02/24 @ 3:00 PM  Items for Follow-up on DC/TOC: Daily weights-not currently performing daily weights. Diet & Fluid Restrictions-Patient drinks  8-9 Diet [employer] daily. Doesn't restrict sodium intake. Continued Heart Failure Education  Charmaine Pines, RN, BSN [facility] Heart Failure Navigator Secure Chat Only

## 2024-10-15 NOTE — Progress Notes (Signed)
 " Progress Note   Patient: Johnathan [name] FMW:[id number] DOB: 1955-11-07 DOA: 10/11/2024     3 DOS: the patient was seen and examined on 10/15/2024   Brief hospital course: 69 y.o. year old male with medical history of hypertension, hyperlipidemia, prediabetes, CHF, atrial fibrillation presenting to the ED with weakness and malaise along with worsening right lower extremity erythema.  Reports decreased p.o. intake along with diarrhea since leaving the hospital.  Patient was hospitalized from 12/18 - 12-25 for right lower extremity cellulitis.  He was discharged on Augmentin for 10 days.  States cellulitis was extensive and improved with IV antibiotics.  On arrival to the ED patient was noted to be HDS stable.  Lab work and imaging obtained.  CBC with leukocytosis at 14.2, mild anemia at 12.5-normocytic.  CMP with normal renal and hepatic function mild hypoalbuminemia.  Lactic acid normal.  INR at 3.3.  Chest x-ray without any acute findings.  Patient was noted to be initially in A-fib with RVR but responded to diltiazem  injection.  Given patient's symptoms, [facility] contacted for admission.   10/12/2024.  Patient had an episode of diarrhea so I sent off a stool for C. difficile and that is positive.  Added p.o. vancomycin  also.  Appreciate wound care nurse for compression wraps.  Leg elevation for cellulitis.  Recent hospitalization at [facility] with Staph hominis growing out of culture and was sent home on Augmentin. 10/13/2024.  Metoprolol  increased to 100 mg twice daily 10/14/2024.  While I was in the room patient needed to go to the bathroom and had a stage VI mushy stool.  Assessment and Plan: * C. difficile colitis Continue p.o. vancomycin .    Cellulitis of right lower extremity Staph hominis growing out of culture from [facility].  Was sent home on Augmentin.  On IV vancomycin  initially here, and now on doxycycline .  Compression wraps ordered.  Elevation right leg.  Routine, Daily, Apply Xeroform gauze to bilat legs  Q day, then cover with ABD and kerlex, beginning just behind toes to below knees, then ace wrap in the same manner.    Chronic atrial fibrillation with rapid ventricular response (HCC) His Toprol -XL was converted over to metoprolol  75 mg twice a day on 1/2.  Increased to 100 mg twice a day on 1/3.  Patient on Coumadin  with INR slightly elevated.  Pharmacy dosing.  Change metoprolol  over to Toprol -XL 100 mg twice daily.  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  Patient on Toprol -XL twice daily, Entresto .  Will switch his hydrochlorothiazide  over to low-dose Lasix  for tomorrow.  Acquired hypothyroidism On levothyroxine   Hyperlipidemia On Crestor   OSA (obstructive sleep apnea) Does not wear CPAP  Obesity, Class III, BMI 40-49.9 (morbid obesity) (HCC) BMI 42.38  Weakness PT and OT recommending rehab  Essential hypertension Continue increased dose of Toprol , Entresto .          Subjective: Patient still having mushy bowel movements.  Right leg feeling better.  Admitted with C. difficile colitis and right lower extremity cellulitis.  Physical Exam: Vitals:   10/14/24 2020 10/14/24 2154 10/15/24 0520 10/15/24 0813  BP: 110/85 110/85 (!) 123/96 (!) 131/91  Pulse: (!) 103 (!) 103 94 94  Resp: 18  18 16   Temp: 97.8 F (36.6 C)  97.8 F (36.6 C) 98.1 F (36.7 C)  TempSrc: Oral  Oral   SpO2: 95%  97% 96%  Weight:   (!) 153.8 kg    Physical Exam  HENT:     Head: Normocephalic.  Eyes:     General: Lids are normal.  Cardiovascular:     Rate and Rhythm: Tachycardia present. Rhythm irregularly irregular.     Heart sounds: Normal heart sounds, S1 normal and S2 normal.  Pulmonary:     Breath sounds: Examination of the right-lower field reveals decreased breath sounds. Examination of the left-lower field reveals decreased breath sounds. Decreased breath sounds present. No wheezing, rhonchi or rales.  Abdominal:     Palpations:  Abdomen is soft.     Tenderness: There is no abdominal tenderness.  Musculoskeletal:     Right lower leg: Swelling present.     Left lower leg: Swelling present.  Skin:    General: Skin is warm.     Comments: Front of right leg no erythema.  Slight erythema back of leg but improved from yesterday.  Neurological:     Mental Status: He is alert and oriented to person, place, and time.     Data Reviewed: Potassium 3.0, creatinine 0.85, magnesium  1.9  Family Communication: Wife at bedside  Disposition: Status is: Inpatient Remains inpatient appropriate because: Insurance authorization is starting for rehab  Planned Discharge Destination: Rehab    Time spent: 28 minutes  Author: Charlie Patterson, MD 10/15/2024 1:41 PM  For on call review [email or web address]  "

## 2024-10-15 NOTE — TOC Progression Note (Signed)
 Transition of Care [facility]) - Progression Note    Patient Details  Name: Johnathan [name] MRN: [id number] Date of Birth: [birth year]  [facility] CM/SW Contact  Alvaro Louder, [place] Phone Number: 10/15/2024, 1:42 PM  Clinical Narrative:   [facility] started by [employer] for patient to admit to [facility] when medically ready.   TOC to follow for discharge.                      Expected Discharge Plan and Services                                               Social Drivers of Health (SDOH) Interventions SDOH Screenings   Food Insecurity: No Food Insecurity (10/11/2024)  Housing: Low Risk (10/11/2024)  Transportation Needs: No Transportation Needs (10/11/2024)  Utilities: Not At Risk (10/11/2024)  Alcohol Screen: Low Risk (09/04/2024)  Depression (PHQ2-9): Medium Risk (10/09/2024)  Financial Resource Strain: Low Risk (09/04/2024)  Physical Activity: Insufficiently Active (09/04/2024)  Social Connections: Moderately Isolated (10/11/2024)  Stress: No Stress Concern Present (09/04/2024)  Tobacco Use: Low Risk (10/11/2024)  Health Literacy: Adequate Health Literacy (09/04/2024)    Readmission Risk Interventions     No data to display

## 2024-10-15 NOTE — Progress Notes (Signed)
 "  Rounding Note   Patient Name: Johnathan [name] Date of Encounter: 10/15/2024  [facility] Cardiologist: Evalene Lunger, MD   Subjective Denies any chest pain or shortness of breath.  Remains in atrial fibrillation with ventricular rates in the 90s to low 100s bpm.  Continues to have watery diarrhea.  Scheduled Meds:  doxycycline   100 mg Oral Q12H   hydrochlorothiazide   25 mg Oral Daily   levothyroxine   25 mcg Oral Q0600   metoprolol  tartrate  100 mg Oral BID   rosuvastatin   10 mg Oral Daily   sacubitril -valsartan   1 tablet Oral BID   sodium chloride  flush  3 mL Intravenous Q12H   vancomycin   125 mg Oral QID   venlafaxine  XR  300 mg Oral Q breakfast   Warfarin - Pharmacist Dosing Inpatient   Does not apply q1600   Continuous Infusions:   PRN Meds: acetaminophen  **OR** acetaminophen , ondansetron  **OR** ondansetron  (ZOFRAN ) IV, oxyCODONE , senna-docusate   Vital Signs  Vitals:   10/14/24 2020 10/14/24 2154 10/15/24 0520 10/15/24 0813  BP: 110/85 110/85 (!) 123/96 (!) 131/91  Pulse: (!) 103 (!) 103 94 94  Resp: 18  18 16   Temp: 97.8 F (36.6 C)  97.8 F (36.6 C) 98.1 F (36.7 C)  TempSrc: Oral  Oral   SpO2: 95%  97% 96%  Weight:   (!) 153.8 kg     Intake/Output Summary (Last 24 hours) at 10/15/2024 0926 Last data filed at 10/15/2024 0650 Gross per 24 hour  Intake 250 ml  Output 1700 ml  Net -1450 ml      10/15/2024    5:20 AM 10/12/2024    5:01 AM 10/11/2024    4:49 PM  Last 3 Weights  Weight (lbs) 339 lb 1.1 oz 339 lb 11.7 oz 339 lb 8.1 oz  Weight (kg) 153.8 kg 154.1 kg 154 kg      Telemetry Atrial fibrillation with ventricular rates in the 90s to low 100s bpm - Personally Reviewed  ECG  No new tracings - Personally Reviewed  Physical Exam  GEN: No acute distress.   Neck: Unable to determine JVD due to body habitus Cardiac: IRIR, no murmurs, rubs, or gallops.  Respiratory: Clear to auscultation bilaterally. GI: Soft, obese, nontender,  non-distended  MS: Right lower extremities with wraps and elevated on pillows trace left lower extremity edema; No deformity. Neuro:  Nonfocal  Psych: Normal affect   Labs High Sensitivity Troponin:  No results for input(s): TROPONINIHS in the last 720 hours. No results for input(s): TRNPT in the last 720 hours.     Chemistry Recent Labs  Lab 10/11/24 1051 10/12/24 0510 10/13/24 1400 10/14/24 0524 10/15/24 0604  NA 137 139  --   --  139  K 3.5 3.6  --   --  3.0*  CL 101 102  --   --  101  CO2 24 24  --   --  23  GLUCOSE 122* 88  --   --  92  BUN 14 13  --   --  11  CREATININE 0.96 0.92 0.85 0.90 0.85  CALCIUM  8.7* 8.2*  --   --  8.8*  MG 2.2  --   --   --  1.9  PROT 7.1  --   --   --   --   ALBUMIN 3.4*  --   --   --   --   AST 31  --   --   --   --  ALT 31  --   --   --   --   ALKPHOS 97  --   --   --   --   BILITOT 0.7  --   --   --   --   GFRNONAA >60 >60 >60 >60 >60  ANIONGAP 12 13  --   --  15    Lipids No results for input(s): CHOL, TRIG, HDL, LABVLDL, LDLCALC, CHOLHDL in the last 168 hours.  Hematology Recent Labs  Lab 10/11/24 1051 10/12/24 0510 10/14/24 0524  WBC 14.2* 11.6* 9.4  RBC 4.13* 3.88* 4.08*  HGB 12.5* 12.0* 12.4*  HCT 37.5* 36.0* 36.9*  MCV 90.8 92.8 90.4  MCH 30.3 30.9 30.4  MCHC 33.3 33.3 33.6  RDW 13.6 14.0 13.8  PLT 292 274 292   Thyroid  No results for input(s): TSH, FREET4 in the last 168 hours.  BNPNo results for input(s): BNP, PROBNP in the last 168 hours.  DDimer No results for input(s): DDIMER in the last 168 hours.   Radiology    Cardiac Studies 2d echo 10/13/2024 1. Left ventricular ejection fraction, by estimation, is 35 to 40%. The  left ventricle has moderately decreased function. The left ventricle  demonstrates global hypokinesis. The left ventricular internal cavity size  was moderately to severely dilated.  There is mild concentric left ventricular hypertrophy. Left ventricular  diastolic  parameters are indeterminate.   2. Right ventricular systolic function is normal. The right ventricular  size is normal.   3. Left atrial size was mildly dilated.   4. The mitral valve is normal in structure. Mild mitral valve  regurgitation. No evidence of mitral stenosis.   5. The aortic valve was not well visualized. Aortic valve regurgitation  is not visualized. Aortic valve sclerosis/calcification is present,  without any evidence of aortic stenosis.   Patient Profile   69 y.o. male with a history of persistent atrial fibrillation originally diagnosed in 05/2012, morbid obesity, hypertension, history of DVT (right lower extremity, 2004), OSA not on CPAP, chronic joint pain, chronic HFmrEF/dilated cardiomyopathy, hypothyroidism, chronic venous insufficiency, who is being seen and evaluated for atrial fibrillation with RVR.  Assessment & Plan  Persistent atrial fibrillation -Initially diagnosed with atrial fibrillation in 2013, cardioverted x 1, presented back to the hospital in 2016 from a tick bite and was found to be back in atrial fibrillation, he is noted to be in paroxysmal atrial fibrillation until around July 2024 and since that time review of EKGs he has maintained atrial fibrillation -Previously in July 2024 discussed therapeutic INRs for potential repeat cardioversion procedure patient was not compliant with follow-ups in the Coumadin  clinic -He has been continued on warfarin with ongoing monitoring dose adjustment per pharmacy protocol with daily INR -INR this morning 3.6 -Continued on metoprolol  tartrate 100 mg twice a daily -Heart rates ranging from upper 90s to 120 bpm at rest -Continue with telemetry monitoring  HFrEF/dilated cardiomyopathy -Echo in 2016 with LVEF of 40-45% -Endorses chronic shortness of breath that is unchanged -Echo this admission with an LVEF of 35-40%, possibly tachycardia mediated from his atrial fibrillation, will need to consolidate his metoprolol   tartrate back to XL prior to discharge  -Started on Entresto  24/26 mg twice daily 1/4 -Add spironolactone  25 mg daily -Toprol -XL was changed to metoprolol  tartrate for better rate control atrial fibrillation -Escalate GDMT further as able with possible addition of SGLT2i -Recommend repeat limited echo in several months with adequate rate control to reassess LVSF, if cardiomyopathy persists  would pursue ischemic evaluation  -Negative 1.4 L output in the last 24 hours and net - 2.2 L for the admission  -Volume status difficult to assess due to body habitus -Continue with daily weights and I's and O's  C. difficile colitis -Stool samples positive for C. Difficile -Oral antibiotic treatment for 10 days with vancomycin  -Ongoing management per IM  Primary hypertension -Blood pressure reasonably controlled  -Continue current medication regimen -Vital signs per unit protocol  Hyperlipidemia -LDL 50 -Continue on statin therapy  Obstructive sleep apnea -Continues to decline CPAP therapy -Contributing to recurrent Afib -Ongoing management per IM  Obesity -BMI of 42.46 -Weight loss would be beneficial  8. Hypokalemia: -Received KCl 40 mEq this morning -Started on spironolactone  today   For questions or updates, please contact [facility] Please consult [email or web address] for contact info under       Signed, Bernardino Bring, PA-C  10/15/2024, 9:26 AM    "

## 2024-10-15 NOTE — Progress Notes (Signed)
 Occupational Therapy Treatment Patient Details Name: Johnathan [name] MRN: [id number] DOB: [birth year] Today's Date: 10/15/2024   History of present illness 69 y.o. male with PMH including venous insufficiency, bilateral LEE, HTN, HLD, prediabetes, neuropathy, right knee arthroplasty, Afib on warfarin is presenting with recurrent cellulitis. Originally presented to [facility] on 12/18 for cellulitis and received IV antibiotics (Zosyn , Vanc >> narrowed to Vanc monotherapy on 12/20) -- infection initially improved and he was ultimately discharged on Augmentin. He finished his course this morning per med rec. Augmentin caused him severe diarrhea and dehydration, resulting in falling along with Afib with RVR prompting him to present here.   OT comments  Mr Dais was seen for OT treatment on this date. Upon arrival to room pt in bed, agreeable to tx. Pt requires CGA + RW for toilet t/f and standing grooming tasks, requires seated rest break, HR 115 standing. Pt limited by fatigue and functional balance deficits, endorses extensive falls hx at home prior to admission. Pt making good progress toward goals, will continue to follow POC. Discharge recommendation remains appropriate.       If plan is discharge home, recommend the following:  A little help with walking and/or transfers;A little help with bathing/dressing/bathroom;Assistance with cooking/housework;Assist for transportation;Help with stairs or ramp for entrance   Equipment Recommendations  BSC/3in1    Recommendations for Other Services      Precautions / Restrictions Precautions Precautions: Fall Recall of Precautions/Restrictions: Intact Restrictions Weight Bearing Restrictions Per Provider Order: No       Mobility Bed Mobility Overal bed mobility: Needs Assistance Bed Mobility: Sit to Supine, Supine to Sit     Supine to sit: Supervision Sit to supine: Supervision        Transfers Overall transfer level: Needs  assistance Equipment used: Rolling walker (2 wheels) Transfers: Sit to/from Stand Sit to Stand: Contact guard assist                 Balance Overall balance assessment: Needs assistance Sitting-balance support: Feet supported Sitting balance-Leahy Scale: Good     Standing balance support: Single extremity supported, During functional activity Standing balance-Leahy Scale: Fair                             ADL either performed or assessed with clinical judgement   ADL Overall ADL's : Needs assistance/impaired                                       General ADL Comments: CGA + RW for toilet t/f and standign grooming tasks, requires seated rest break, HR 115 standing.     Communication Communication Communication: No apparent difficulties   Cognition Arousal: Alert Behavior During Therapy: WFL for tasks assessed/performed Cognition: No apparent impairments                               Following commands: Intact        Cueing   Cueing Techniques: Verbal cues             Pertinent Vitals/ Pain       Pain Assessment Pain Assessment: No/denies pain   Frequency  Min 2X/week        Progress Toward Goals  OT Goals(current goals can now be found in the care plan section)  Progress  towards OT goals: Progressing toward goals  Acute Rehab OT Goals OT Goal Formulation: With patient Time For Goal Achievement: 10/26/24 Potential to Achieve Goals: Good ADL Goals Pt Will Perform Lower Body Dressing: with modified independence;sit to/from stand;sitting/lateral leans;with adaptive equipment Pt Will Transfer to Toilet: with modified independence;ambulating Pt Will Perform Toileting - Clothing Manipulation and hygiene: with modified independence;sitting/lateral leans;sit to/from stand Additional ADL Goal #1: Pt will verbalize plan to implement at least 2 learned ECS/falls prevention strategies to maximize safety/indep and  minimize over exertion/falls. Additional ADL Goal #2: Pt will complete all aspects of bathing, primarily in sitting, utilizing learned ECS with mod indep, 1/1 opportunity.  Plan      Co-evaluation                 AM-PAC OT 6 Clicks Daily Activity     Outcome Measure   Help from another person eating meals?: None Help from another person taking care of personal grooming?: None Help from another person toileting, which includes using toliet, bedpan, or urinal?: A Little Help from another person bathing (including washing, rinsing, drying)?: A Lot Help from another person to put on and taking off regular upper body clothing?: A Little Help from another person to put on and taking off regular lower body clothing?: A Lot 6 Click Score: 18    End of Session Equipment Utilized During Treatment: Rolling walker (2 wheels)  OT Visit Diagnosis: Other abnormalities of gait and mobility (R26.89);Repeated falls (R29.6);Muscle weakness (generalized) (M62.81)   Activity Tolerance Patient tolerated treatment well   Patient Left in bed;with call bell/phone within reach;with family/visitor present   Nurse Communication          Time: 1110-1140 OT Time Calculation (min): 30 min  Charges: OT General Charges $OT Visit: 1 Visit OT Treatments $Self Care/Home Management : 8-22 mins $Therapeutic Activity: 8-22 mins  Elston Slot, M.S. OTR/L  10/15/2024, 1:35 PM  ascom [phone]

## 2024-10-15 NOTE — Progress Notes (Signed)
 PHARMACY - ANTICOAGULATION CONSULT NOTE  Pharmacy Consult for Warfarin Indication: atrial fibrillation  Allergies[1]  Patient Measurements: Weight: (!) 153.8 kg (339 lb 1.1 oz)  Vital Signs: Temp: 97.8 F (36.6 C) (01/05 0520) Temp Source: Oral (01/05 0520) BP: 123/96 (01/05 0520) Pulse Rate: 94 (01/05 0520)  Labs: Recent Labs    10/13/24 0543 10/13/24 1400 10/14/24 0524 10/15/24 0604  HGB  --   --  12.4*  --   HCT  --   --  36.9*  --   PLT  --   --  292  --   LABPROT 37.5*  --  36.1* 37.8*  INR 3.6*  --  3.4* 3.6*  CREATININE  --  0.85 0.90 0.85    Estimated Creatinine Clearance: 130.2 mL/min (by C-G formula based on SCr of 0.85 mg/dL).   Medical History: Past Medical History:  Diagnosis Date   Arthritis    a. chronic joint pain   B12 deficiency    Chronic systolic CHF (congestive heart failure) (HCC)    a. echo 2013: EF of 50-55%, normal right ventricular systolic pressure, normal left atrium; b. EF 40-45%, inadequate for LV wall motion, mild MR, LA severely dilated @ 52 mm, PASP nl   Complication of anesthesia    Dysrhythmia    Erythema migrans (Lyme disease) 04/08/2015   History of DVT (deep vein thrombosis) 2004   s/p bariatric surgery   History of stress test    a. there was no ST segment deviation noted during stress,    There is a small defect of moderate severity present in the apex location, suggestive of apical ischemia, intermediate risk, calculated EF 21% but visually appears to be 35-40%   Hypertension    Hypothyroidism    Neuropathy    Obesity    OSA (obstructive sleep apnea)    a. not compliant with CPAP   PAF (paroxysmal atrial fibrillation) (HCC)    a. CHADSVASc at least 2 (HTN and vascular disease); b. on Eliquis     Pre-diabetes     Medications:  Warfarin home regimen is 7.5mg  on Mon and Fri, 5mg  all other days. Last dose 1/1  Assessment: 69 y.o. male with PMH including venous insufficiency, bilateral LEE, neuropathy, right knee  arthroplasty, Afib on warfarin is presenting with recurrent cellulitis. Pharmacy has been consulted to dose warfarin while inpatient.  Home dose: 7.5mg  on Mon and Fri, 5mg  all other days Last dose: 1/1 Goal INR: 2-3 Baseline INR: 3.3  Drug interactions: venlafaxine  (increased risk of bleeding), Doxycyline (INR increase)  Date INR Warfarin Dose  1/1 3.3 Patient took dose at home  1/2 3.6 HOLD  1/3 3.6 2.5 mg  1/4 3.4 5 mg  1/5 3.6 Hold    Goal of Therapy:  INR 2-3 Monitor platelets by anticoagulation protocol: Yes   Plan:  INR is supratherapeutic Will hold warfarin dose today Continue to monitor daily INR  Kayla JULIANNA Blew, PharmD 10/15/2024,7:34 AM     [1]  Allergies Allergen Reactions   Eliquis  [Apixaban ] Itching    Red spots and severe itching   Xarelto [Rivaroxaban] Hives    Reaction in 2013

## 2024-10-15 NOTE — Progress Notes (Signed)
 Physical Therapy Treatment Patient Details Name: Johnathan [name] MRN: [id number] DOB: November 11, 1955 Today's Date: 10/15/2024   History of Present Illness 69 y.o. male with PMH including venous insufficiency, bilateral LEE, HTN, HLD, prediabetes, neuropathy, right knee arthroplasty, Afib on warfarin is presenting with recurrent cellulitis. Originally presented to [facility] on 12/18 for cellulitis and received IV antibiotics (Zosyn , Vanc >> narrowed to Vanc monotherapy on 12/20) -- infection initially improved and he was ultimately discharged on Augmentin. He finished his course this morning per med rec. Augmentin caused him severe diarrhea and dehydration, resulting in falling along with Afib with RVR prompting him to present here.    PT Comments  Pt seen for PT tx with pt declining OOB mobility 2/2 pinching pain in L shoulder, reports he'd like to try walking tomorrow. Pt agreeable to bed level exercises, PRN cuing re: technique. Pt eager to d/c to rehab to increase activity tolerance & reduce fall risk. Will continue to follow pt acutely to progress mobility as able.    If plan is discharge home, recommend the following: A little help with walking and/or transfers;A little help with bathing/dressing/bathroom;Assistance with cooking/housework;Assist for transportation;Help with stairs or ramp for entrance   Can travel by private vehicle     Yes  Equipment Recommendations  Other (comment) (defer to next venue)    Recommendations for Other Services       Precautions / Restrictions Precautions Precautions: Fall Restrictions Weight Bearing Restrictions Per Provider Order: No     Mobility  Bed Mobility                    Transfers                        Ambulation/Gait                   Stairs             Wheelchair Mobility     Tilt Bed    Modified Rankin (Stroke Patients Only)       Balance                                             Communication Communication Communication: No apparent difficulties  Cognition Arousal: Alert Behavior During Therapy: WFL for tasks assessed/performed   PT - Cognitive impairments: No apparent impairments                         Following commands: Intact      Cueing Cueing Techniques: Verbal cues  Exercises General Exercises - Lower Extremity Hip ABduction/ADduction: AROM, Strengthening, Both, 10 reps, Supine (hip adduction pillow squeezes x 10) Straight Leg Raises: AROM, Supine, Strengthening, Both, 10 reps    General Comments        Pertinent Vitals/Pain Pain Assessment Pain Assessment: Faces Faces Pain Scale: Hurts a little bit Pain Location: L shoulder Pain Descriptors / Indicators: Discomfort (pinching) Pain Intervention(s): Monitored during session, Limited activity within patient's tolerance    Home Living                          Prior Function            PT Goals (current goals can now be found in the care plan section) Acute Rehab  PT Goals Patient Stated Goal: to get stronger, go to rehab PT Goal Formulation: With patient Time For Goal Achievement: 10/26/24 Potential to Achieve Goals: Good Progress towards PT goals: Progressing toward goals    Frequency    Min 2X/week      PT Plan      Co-evaluation              AM-PAC PT 6 Clicks Mobility   Outcome Measure  Help needed turning from your back to your side while in a flat bed without using bedrails?: A Little Help needed moving from lying on your back to sitting on the side of a flat bed without using bedrails?: A Little Help needed moving to and from a bed to a chair (including a wheelchair)?: A Little Help needed standing up from a chair using your arms (e.g., wheelchair or bedside chair)?: A Little Help needed to walk in hospital room?: A Little Help needed climbing 3-5 steps with a railing? : A Lot 6 Click Score: 17    End of Session   Activity  Tolerance: Patient limited by pain Patient left: in bed;with call bell/phone within reach Nurse Communication: Mobility status PT Visit Diagnosis: Other abnormalities of gait and mobility (R26.89);Difficulty in walking, not elsewhere classified (R26.2);Muscle weakness (generalized) (M62.81)     Time: 8576-8567 PT Time Calculation (min) (ACUTE ONLY): 9 min  Charges:    $Therapeutic Activity: 8-22 mins PT General Charges $$ ACUTE PT VISIT: 1 Visit                     Richerd Pinal, PT, DPT 10/15/2024, 2:39 PM    Richerd CHRISTELLA Pinal 10/15/2024, 2:38 PM

## 2024-10-15 NOTE — Plan of Care (Signed)

## 2024-10-15 NOTE — Care Management Important Message (Signed)
 Important Message  Patient Details  Name: Johnathan [name] MRN: [id number] Date of Birth: [birth year]   Important Message Given:  Yes - Medicare IM     Ladoris Lythgoe W, CMA 10/15/2024, 12:42 PM

## 2024-10-15 NOTE — Plan of Care (Signed)
  Problem: Education: Goal: Knowledge of General Education information will improve Description: Including pain rating scale, medication(s)/side effects and non-pharmacologic comfort measures Outcome: Progressing   Problem: Clinical Measurements: Goal: Respiratory complications will improve Outcome: Progressing Goal: Cardiovascular complication will be avoided Outcome: Progressing   Problem: Nutrition: Goal: Adequate nutrition will be maintained Outcome: Progressing   Problem: Coping: Goal: Level of anxiety will decrease Outcome: Progressing

## 2024-10-15 NOTE — Telephone Encounter (Signed)
 Patient Product/process Development Scientist completed.    The patient is insured through [employer]/ [employer]. Patient has Medicare and is not eligible for a copay card, but may be able to apply for patient assistance or Medicare RX Payment Plan (Patient Must reach out to their plan, if eligible for payment plan), if available.    Ran test claim for vancomycin  125 mg and the current 10 day co-pay is $92.47 due to deductible.  Ran test claim for sacubitril -valsartan  24-26 mg and the current 10 day co-pay is $19.13 due to deductible.   This test claim was processed through [facility]- copay amounts may vary at other pharmacies due to pharmacy/plan contracts, or as the patient moves through the different stages of their insurance plan.     Reyes Sharps, CPHT Pharmacy Technician Patient Advocate Specialist Lead [facility] Patient Advocate Team Direct Number: [phone]  Fax: [phone]

## 2024-10-16 ENCOUNTER — Ambulatory Visit: Admitting: Neurology

## 2024-10-16 ENCOUNTER — Encounter: Payer: Self-pay | Admitting: Neurology

## 2024-10-16 DIAGNOSIS — I5021 Acute systolic (congestive) heart failure: Secondary | ICD-10-CM | POA: Diagnosis not present

## 2024-10-16 DIAGNOSIS — L03115 Cellulitis of right lower limb: Secondary | ICD-10-CM | POA: Diagnosis not present

## 2024-10-16 DIAGNOSIS — R791 Abnormal coagulation profile: Secondary | ICD-10-CM | POA: Insufficient documentation

## 2024-10-16 DIAGNOSIS — I4819 Other persistent atrial fibrillation: Secondary | ICD-10-CM | POA: Diagnosis not present

## 2024-10-16 DIAGNOSIS — I10 Essential (primary) hypertension: Secondary | ICD-10-CM | POA: Diagnosis not present

## 2024-10-16 LAB — CULTURE, BLOOD (ROUTINE X 2)
Culture: NO GROWTH
Culture: NO GROWTH
Special Requests: ADEQUATE

## 2024-10-16 LAB — CBC
HCT: 43.8 % (ref 39.0–52.0)
Hemoglobin: 14.5 g/dL (ref 13.0–17.0)
MCH: 29.9 pg (ref 26.0–34.0)
MCHC: 33.1 g/dL (ref 30.0–36.0)
MCV: 90.3 fL (ref 80.0–100.0)
Platelets: 299 K/uL (ref 150–400)
RBC: 4.85 MIL/uL (ref 4.22–5.81)
RDW: 13.6 % (ref 11.5–15.5)
WBC: 10 K/uL (ref 4.0–10.5)
nRBC: 0 % (ref 0.0–0.2)

## 2024-10-16 LAB — BASIC METABOLIC PANEL WITH GFR
Anion gap: 15 (ref 5–15)
BUN: 16 mg/dL (ref 8–23)
CO2: 25 mmol/L (ref 22–32)
Calcium: 8.7 mg/dL — ABNORMAL LOW (ref 8.9–10.3)
Chloride: 101 mmol/L (ref 98–111)
Creatinine, Ser: 1 mg/dL (ref 0.61–1.24)
GFR, Estimated: >60 mL/min
Glucose, Bld: 103 mg/dL — ABNORMAL HIGH (ref 70–99)
Potassium: 3.2 mmol/L — ABNORMAL LOW (ref 3.5–5.1)
Sodium: 141 mmol/L (ref 135–145)

## 2024-10-16 LAB — GLUCOSE, CAPILLARY: Glucose-Capillary: 191 mg/dL — ABNORMAL HIGH (ref 70–99)

## 2024-10-16 LAB — PROTIME-INR
INR: 3.8 — ABNORMAL HIGH (ref 0.8–1.2)
Prothrombin Time: 39.1 s — ABNORMAL HIGH (ref 11.4–15.2)

## 2024-10-16 MED ORDER — POTASSIUM CHLORIDE CRYS ER 20 MEQ PO TBCR
40.0000 meq | EXTENDED_RELEASE_TABLET | Freq: Once | ORAL | Status: AC
  Administered 2024-10-16: 40 meq via ORAL
  Filled 2024-10-16: qty 2

## 2024-10-16 MED ORDER — FUROSEMIDE 20 MG PO TABS: 20.0000 mg | ORAL_TABLET | Freq: Every day | ORAL | 0 refills | Status: AC

## 2024-10-16 MED ORDER — SPIRONOLACTONE 25 MG PO TABS: 25.0000 mg | ORAL_TABLET | Freq: Every day | ORAL | 0 refills | Status: AC

## 2024-10-16 MED ORDER — FUROSEMIDE 20 MG PO TABS: 20.0000 mg | ORAL_TABLET | Freq: Every day | ORAL | Status: DC

## 2024-10-16 MED ORDER — VANCOMYCIN HCL 125 MG PO CAPS: 125.0000 mg | ORAL_CAPSULE | Freq: Four times a day (QID) | ORAL | 0 refills | Status: AC

## 2024-10-16 MED ORDER — WARFARIN SODIUM 2.5 MG PO TABS: 2.5000 mg | ORAL_TABLET | Freq: Every day | ORAL | 0 refills | Status: AC

## 2024-10-16 MED ORDER — METOPROLOL SUCCINATE ER 100 MG PO TB24: 100.0000 mg | ORAL_TABLET | Freq: Two times a day (BID) | ORAL | 0 refills | Status: DC

## 2024-10-16 MED ORDER — DOXYCYCLINE HYCLATE 100 MG PO TABS: 100.0000 mg | ORAL_TABLET | Freq: Two times a day (BID) | ORAL | 0 refills | Status: AC

## 2024-10-16 MED ORDER — SACUBITRIL-VALSARTAN 24-26 MG PO TABS: 1.0000 | ORAL_TABLET | Freq: Two times a day (BID) | ORAL | 0 refills | Status: AC

## 2024-10-16 MED ORDER — OXYCODONE HCL 5 MG PO TABS: 5.0000 mg | ORAL_TABLET | Freq: Four times a day (QID) | ORAL | 0 refills | Status: DC | PRN

## 2024-10-16 MED ORDER — GABAPENTIN 300 MG PO CAPS: 300.0000 mg | ORAL_CAPSULE | Freq: Two times a day (BID) | ORAL | 0 refills | Status: AC

## 2024-10-16 MED ORDER — ACETAMINOPHEN 325 MG PO TABS: 650.0000 mg | ORAL_TABLET | Freq: Four times a day (QID) | ORAL | Status: AC | PRN

## 2024-10-16 NOTE — Progress Notes (Signed)
 PHARMACY - ANTICOAGULATION CONSULT NOTE  Pharmacy Consult for Warfarin Indication: atrial fibrillation  Allergies[1]  Patient Measurements: Weight: (!) 153.8 kg (339 lb 1.1 oz)  Vital Signs: Temp: 98.4 F (36.9 C) (01/06 0441) Temp Source: Oral (01/06 0441) BP: 132/95 (01/06 0441) Pulse Rate: 83 (01/06 0441)  Labs: Recent Labs    10/14/24 0524 10/15/24 0604 10/16/24 0452  HGB 12.4*  --  14.5  HCT 36.9*  --  43.8  PLT 292  --  299  LABPROT 36.1* 37.8* 39.1*  INR 3.4* 3.6* 3.8*  CREATININE 0.90 0.85 1.00    Estimated Creatinine Clearance: 110.6 mL/min (by C-G formula based on SCr of 1 mg/dL).   Medical History: Past Medical History:  Diagnosis Date   Arthritis    a. chronic joint pain   B12 deficiency    Chronic systolic CHF (congestive heart failure) (HCC)    a. echo 2013: EF of 50-55%, normal right ventricular systolic pressure, normal left atrium; b. EF 40-45%, inadequate for LV wall motion, mild MR, LA severely dilated @ 52 mm, PASP nl   Complication of anesthesia    Dysrhythmia    Erythema migrans (Lyme disease) 04/08/2015   History of DVT (deep vein thrombosis) 2004   s/p bariatric surgery   History of stress test    a. there was no ST segment deviation noted during stress,    There is a small defect of moderate severity present in the apex location, suggestive of apical ischemia, intermediate risk, calculated EF 21% but visually appears to be 35-40%   Hypertension    Hypothyroidism    Neuropathy    Obesity    OSA (obstructive sleep apnea)    a. not compliant with CPAP   PAF (paroxysmal atrial fibrillation) (HCC)    a. CHADSVASc at least 2 (HTN and vascular disease); b. on Eliquis     Pre-diabetes     Medications:  Warfarin home regimen is 7.5mg  on Mon and Fri, 5mg  all other days. Last dose 1/1  Assessment: 69 y.o. male with PMH including venous insufficiency, bilateral LEE, neuropathy, right knee arthroplasty, Afib on warfarin is presenting with  recurrent cellulitis. Pharmacy has been consulted to dose warfarin while inpatient.  Home dose: 7.5mg  on Mon and Fri, 5mg  all other days Last dose: 1/1 Goal INR: 2-3 Baseline INR: 3.3  Drug interactions: venlafaxine  (increased risk of bleeding), Doxycyline (INR increase)  Date INR Warfarin Dose  1/1 3.3 Patient took dose at home  1/2 3.6 HOLD  1/3 3.6 2.5 mg  1/4 3.4 5 mg  1/5 3.6 Hold  1/6 3.8 Hold    Goal of Therapy:  INR 2-3 Monitor platelets by anticoagulation protocol: Yes   Plan:  INR is supratherapeutic Will hold warfarin dose today Continue to monitor daily INR  Kayla JULIANNA Blew, PharmD 10/16/2024,6:56 AM      [1]  Allergies Allergen Reactions   Eliquis  [Apixaban ] Itching    Red spots and severe itching   Xarelto [Rivaroxaban] Hives    Reaction in 2013

## 2024-10-16 NOTE — Assessment & Plan Note (Signed)
 INR 3.8.  Pharmacy recommends holding Coumadin  today and tomorrow and restarting 2.5 mg daily starting on Thursday.  Check an INR on Thursday and check weekly afterwards.

## 2024-10-16 NOTE — Progress Notes (Incomplete)
 Heart Failure Stewardship Pharmacy Note  PCP: Marylynn Verneita CROME, MD PCP-Cardiologist: Evalene Lunger, MD  HPI: Johnathan [name] is a 69 y.o. 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 On admission, BNP was not measured, HS-troponin was not measured, and lactic acid 1.3. Chest x-ray noted low lung volumes.   Pertinent cardiac history: TTE in 2013 noted normal LVEF. TTE in 04/2015 noted LVEF of 40-45%. Stress test 04/2015 consistent with apical ischemia. TTE 10/2024 noted LVEF reduced to 35-40%, mild LVH.   Pertinent Lab Values: Creat  Date Value Ref Range Status  03/19/2022 0.95 0.70 - 1.35 mg/dL Final   Creatinine, Ser  Date Value Ref Range Status  10/16/2024 1.00 0.61 - 1.24 mg/dL Final   BUN  Date Value Ref Range Status  10/16/2024 16 8 - 23 mg/dL Final  87/79/7978 27 8 - 27 mg/dL Final  89/77/7985 12 7 - 18 mg/dL Final   Potassium  Date Value Ref Range Status  10/16/2024 3.2 (L) 3.5 - 5.1 mmol/L Final  08/01/2013 3.8 3.5 - 5.1 mmol/L Final   Sodium  Date Value Ref Range Status  10/16/2024 141 135 - 145 mmol/L Final  09/29/2020 140 134 - 144 mmol/L Final  08/01/2013 135 (L) 136 - 145 mmol/L Final   Magnesium   Date Value Ref Range Status  10/15/2024 1.9 1.7 - 2.4 mg/dL Final    Comment:    Performed at [facility], [address] [place], [place] [address]  05/31/2012 1.9 mg/dL Final    Comment:    8.1-7.5 THERAPEUTIC RANGE: 4-7 mg/dL TOXIC: > 10 mg/dL  -----------------------    Hgb A1c MFr Bld  Date Value Ref Range Status  07/03/2024 6.4 4.6 - 6.5 % Final    Comment:    Glycemic Control Guidelines for People with Diabetes:Non Diabetic:  <6%Goal of Therapy: <7%Additional Action Suggested:  >8%    Digoxin , Serum  Date Value Ref Range Status  04/15/2015 <0.4 (L) 0.9 -  2.0 ng/mL Final    Comment:    Concentrations above 2.0 ng/mL are generally considered toxic. Some overlap of toxic and non-toxic values have been reported.                             Detection Limit = 0.4 ng/mL    TSH  Date Value Ref Range Status  07/03/2024 3.23 0.35 - 5.50 uIU/mL Final    Vital Signs: Admission weight: Temp:  [98.4 F (36.9 C)] 98.4 F (36.9 C) (01/06 0441) Pulse Rate:  [83-96] 83 (01/06 0441) Cardiac Rhythm: Atrial fibrillation (01/05 1900) Resp:  [17-18] 18 (01/06 0441) BP: (108-132)/(78-95) 132/95 (01/06 0441) SpO2:  [97 %-98 %] 97 % (01/06 0441)  Intake/Output Summary (Last 24 hours) at 10/16/2024 0818 Last data filed at 10/15/2024 2200 Gross per 24 hour  Intake 570 ml  Output 250 ml  Net 320 ml    Current Heart Failure Medications:  Loop diuretic: none Beta-Blocker: metoprolol  tartrate 100 mg BID ACEI/ARB/ARNI: Entresto  24-26 mg BID MRA: spironolactone  25 mg daily SGLT2i: none Other: hydrochlorothiazide  25 mg daily  Prior to admission Heart Failure Medications:  Loop diuretic: furosemide  40 mg prn Beta-Blocker: metoprolol  succinate 100 mg daily ACEI/ARB/ARNI: losartan  100 mg daily MRA: none SGLT2i: none Other: hydrochlorothiazide  25 mg daily  Assessment: 1. Acute on chronic systolic heart failure (  LVEF 35-40%)  , due to unknown etiology. NYHA class *** symptoms.  -Symptoms: -Volume: -Hemodynamics: -BB: -ACEI/ARB/ARNI: -MRA: -SGLT2i:  Plan: 1) Medication changes recommended at this time:  2) Patient assistance:   3) Education: -To be completed prior to discharge.  *** Medication Assistance / Insurance Benefits Check: Does the patient have prescription insurance?    Type of insurance plan:  Does the patient qualify for medication assistance through manufacturers or grants? {CHL AMB Yes/No/Pending:[id number]}  Eligible grants and/or patient assistance programs: ***  Medication assistance applications in progress: ***   Medication assistance applications approved: *** Approved medication assistance renewals will be completed by: ***  Outpatient Pharmacy: Prior to admission outpatient pharmacy: ***      ***

## 2024-10-16 NOTE — Progress Notes (Signed)
 Pt was picked up by the EMS, all belongings returned.

## 2024-10-16 NOTE — Discharge Summary (Signed)
 " Physician Discharge Summary   Patient: Johnathan [name] MRN: [id number] DOB: 1956/08/18  Admit date:     10/11/2024  Discharge date: 10/16/2024  Discharge Physician: Charlie Patterson   PCP: Marylynn Verneita CROME, MD   Recommendations at discharge:   Follow-up with team at rehab 1 day Check INR on Thursday and weekly afterwards  Discharge Diagnoses: Principal Problem:   C. 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 *  Hospital Course: 69 y.o. year old male with medical history of hypertension, hyperlipidemia, prediabetes, CHF, atrial fibrillation presenting to the ED with weakness and malaise along with worsening right lower extremity erythema.  Reports decreased p.o. intake along with diarrhea since leaving the hospital.  Patient was hospitalized from 12/18 - 12-25 for right lower extremity cellulitis.  He was discharged on Augmentin for 10 days.  States cellulitis was extensive and improved with IV antibiotics.  On arrival to the ED patient was noted to be HDS stable.  Lab work and imaging obtained.  CBC with leukocytosis at 14.2, mild anemia at 12.5-normocytic.  CMP with normal renal and hepatic function mild hypoalbuminemia.  Lactic acid normal.  INR at 3.3.  Chest x-ray without any acute findings.  Patient was noted to be initially in A-fib with RVR but responded to diltiazem  injection.  Given patient's symptoms, [facility]  contacted for admission.   10/12/2024.  Patient had an episode of diarrhea so I sent off a stool for C. difficile and that is positive.  Added p.o. vancomycin  also.  Appreciate wound care nurse for compression wraps.  Leg elevation for cellulitis.  Recent hospitalization at [facility] with Staph hominis growing out of culture and was sent home on Augmentin. 10/13/2024.  Metoprolol  increased to 100 mg twice daily 10/14/2024.  While I was in the room patient needed to go to the bathroom and had a stage VI mushy stool. 10/15/2024.  Patient still having mushy bowel movement.  Only 1 bowel movement yesterday 10/16/2024.  Received insurance authorization for rehab.  Patient will have 3 more doses of doxycycline  and complete that antibiotic for right lower extremity cellulitis.  Will give 10 days of vancomycin  orally 4 times a day  Assessment and Plan: * C. difficile colitis Continue p.o. vancomycin .  Will give another 10 days of antibiotic.  Had 1 episode of stage VI stool yesterday and 1 episode today.  Cellulitis of right lower extremity Staph hominis growing out of culture from [facility].  Was sent home on Augmentin.  On IV vancomycin  initially here, and now on doxycycline  (for 3 more doses).  Compression wraps ordered.  Elevation right leg while in bed.  Routine, Daily, Apply Xeroform gauze to bilat legs Q day, then cover with ABD and kerlex, beginning just behind toes to below knees, then ace wrap in the same manner.    Chronic atrial fibrillation with rapid ventricular response (HCC) His Toprol -XL was converted over to metoprolol  75 mg twice  a day on 1/2.  Increased to 100 mg twice a day on 1/3.  Change metoprolol  over to Toprol -XL 100 mg twice daily.  INR has been on the higher side but we have been giving antibiotics.  Hold Coumadin  until Thursday then start 2.5 mg daily at that time.  Check INR on Thursday and weekly until stable.  INR upon discharge 3.8.  Chronic heart failure with reduced ejection fraction  (HFrEF, <= 40%) (HCC) EF 35 to 40%.  Patient on Toprol -XL twice daily, Entresto , Aldactone .  Will switch his hydrochlorothiazide  over to low-dose Lasix  for tomorrow.  Can consider SGLT2 inhibitor as outpatient.  Supratherapeutic INR INR 3.8.  Pharmacy recommends holding Coumadin  today and tomorrow and restarting 2.5 mg daily starting on Thursday.  Check an INR on Thursday and check weekly afterwards.  Acquired hypothyroidism On levothyroxine   Hyperlipidemia On Crestor   OSA (obstructive sleep apnea) Does not wear CPAP  Obesity, Class III, BMI 40-49.9 (morbid obesity) (HCC) BMI 42.38  Weakness PT and OT recommending rehab  Essential hypertension Continue increased dose of Toprol , Entresto .           Consultants: Cardiology Procedures performed: None Disposition: Rehabilitation facility Diet recommendation:  Cardiac diet DISCHARGE MEDICATION: Allergies as of 10/16/2024       Reactions   Eliquis  [apixaban ] Itching   Red spots and severe itching   Xarelto [rivaroxaban] Hives   Reaction in 2013        Medication List     STOP taking these medications    amiodarone  200 MG tablet Commonly known as: PACERONE    amoxicillin-clavulanate 875-125 MG tablet Commonly known as: AUGMENTIN   B COMPLEX 50 PO   docusate sodium  100 MG capsule Commonly known as: COLACE   Ferrous Gluconate  324 (37.5 Fe) MG Tabs   GLUCOSAMINE HCL-MSM PO   hydrochlorothiazide  25 MG tablet Commonly known as: HYDRODIURIL    HYDROcodone -acetaminophen  10-325 MG tablet Commonly known as: NORCO   losartan  100 MG tablet Commonly known as: COZAAR    Potassium Chloride  ER 20 MEQ Tbcr   propranolol  20 MG tablet Commonly known as: INDERAL    sildenafil  20 MG tablet Commonly known as: REVATIO    tadalafil  20 MG tablet Commonly known as: CIALIS    Vitamin D3 50 MCG (2000 UT) Tabs       TAKE these medications    acetaminophen  325 MG tablet Commonly known as: TYLENOL  Take 2 tablets  (650 mg total) by mouth every 6 (six) hours as needed for mild pain (pain score 1-3) or fever (or Fever >/= 101).   buPROPion  300 MG 24 hr tablet Commonly known as: WELLBUTRIN  XL Take 1 tablet (300 mg total) by mouth daily.   doxycycline  100 MG tablet Commonly known as: VIBRA -TABS Take 1 tablet (100 mg total) by mouth every 12 (twelve) hours for 3 doses.   furosemide  20 MG tablet Commonly known as: LASIX  Take 1 tablet (20 mg total) by mouth daily. Start taking on: October 17, 2024 What changed:  medication strength how much to take when to take this reasons to take this   gabapentin  300 MG capsule Commonly known as: NEURONTIN  Take 1 capsule (300 mg total) by mouth 2 (two) times daily. TAKE 1 CAPSULE BY MOUTH TWO TIMES A DAY   levothyroxine  25 MCG tablet Commonly known as: SYNTHROID  Take 1 tablet (25 mcg total) by mouth daily before breakfast.   metoprolol  succinate 100 MG 24 hr tablet Commonly known as: TOPROL -XL Take 1 tablet (100 mg total) by mouth 2 (two) times  daily. Take with or immediately following a meal. What changed:  when to take this additional instructions   multivitamin tablet Take 1 tablet by mouth daily.   oxyCODONE  5 MG immediate release tablet Commonly known as: Oxy IR/ROXICODONE  Take 1 tablet (5 mg total) by mouth every 6 (six) hours as needed for severe pain (pain score 7-10).   rosuvastatin  10 MG tablet Commonly known as: CRESTOR  TAKE 1 TABLET BY MOUTH ONCE DAILY AT  2PM   sacubitril -valsartan  24-26 MG Commonly known as: ENTRESTO  Take 1 tablet by mouth 2 (two) times daily.   spironolactone  25 MG tablet Commonly known as: ALDACTONE  Take 1 tablet (25 mg total) by mouth daily. Start taking on: October 17, 2024   vancomycin  125 MG capsule Commonly known as: VANCOCIN  Take 1 capsule (125 mg total) by mouth 4 (four) times daily for 10 days.   venlafaxine  XR 150 MG 24 hr capsule Commonly known as: EFFEXOR -XR Take 2 capsules (300 mg total) by  mouth daily with breakfast.   vitamin B-12 500 MCG tablet Commonly known as: CYANOCOBALAMIN  Take 500 mcg by mouth daily.   vitamin E  180 MG (400 UNITS) capsule Take 400 Units by mouth daily.   warfarin 2.5 MG tablet Commonly known as: COUMADIN  Take as directed. If you are unsure how to take this medication, talk to your nurse or doctor. Original instructions: Take 1 tablet (2.5 mg total) by mouth daily. Start taking on: October 18, 2024 What changed:  medication strength how much to take how to take this when to take this additional instructions These instructions start on October 18, 2024. If you are unsure what to do until then, ask your doctor or other care provider.        Contact information for follow-up providers     [facility]. Go on 10/16/2024.   Specialty: Cardiology Why: Hospital Follow-Up 10/16/2024 @ 10:50 Please bring all medications to follow-up appointment [facility], [address] Floor Free Valet Parking at the door Contact information: [address] [place]  [address] [phone]             Contact information for after-discharge care     Destination     [facility] [place], [place]. [name]   Service: Skilled Nursing Contact information: [address] [place]  [address] [phone]                    Discharge Exam: Fredricka Weights   10/11/24 1649 10/12/24 0501 10/15/24 0520  Weight: (!) 154 kg (!) 154.1 kg (!) 153.8 kg   Physical Exam HENT:     Head: Normocephalic.  Eyes:     General: Lids are normal.  Cardiovascular:     Rate and Rhythm: Tachycardia present. Rhythm irregularly irregular.     Heart sounds: Normal heart sounds, S1 normal and S2 normal.  Pulmonary:     Breath sounds: Examination of the right-lower field reveals decreased breath sounds. Examination of the left-lower field reveals decreased breath sounds.  Decreased breath sounds present. No wheezing, rhonchi or rales.  Abdominal:     Palpations: Abdomen is soft.     Tenderness: There is no abdominal tenderness.  Musculoskeletal:     Right lower leg: Swelling present.     Left lower leg: Swelling present.  Skin:    General: Skin is warm.     Comments: Front of right leg no erythema.  Slight erythema back of leg  but improved from yesterday.  Neurological:     Mental Status: He is alert and oriented to person, place, and time.      Condition at discharge: stable  The results of significant diagnostics from this hospitalization (including imaging, microbiology, ancillary and laboratory) are listed below for reference.   Imaging Studies: ECHOCARDIOGRAM COMPLETE Result Date: 10/13/2024    ECHOCARDIOGRAM REPORT   Patient Name:   LUQMAN PERRELLI Date of Exam: 10/13/2024 Medical Rec #:  [id number]        Height:       75.0 in Accession #:    [id number]       Weight:       339.7 lb Date of Birth:  November 09, 1955       BSA:          2.749 m Patient Age:    68 years         BP:           131/96 mmHg Patient Gender: M                HR:           110 bpm. Exam Location:  [facility] Procedure: 2D Echo, Cardiac Doppler, Color Doppler and Intracardiac            Opacification Agent (Both Spectral and Color Flow Doppler were            utilized during procedure). Indications:     Atrial Fibrillation I48.91  History:         Patient has prior history of Echocardiogram examinations, most                  recent 04/18/2015.  Sonographer:     Thedora Louder RDCS, FASE Referring Phys:  [id number] SHERI HAMMOCK Diagnosing Phys: Shelda Bruckner MD  Sonographer Comments: Technically difficult study due to poor echo windows and patient is obese. Image acquisition challenging due to patient body habitus. IMPRESSIONS  1. Left ventricular ejection fraction, by estimation, is 35 to 40%. The left ventricle has moderately decreased function. The left ventricle demonstrates global  hypokinesis. The left ventricular internal cavity size was moderately to severely dilated. There is mild concentric left ventricular hypertrophy. Left ventricular diastolic parameters are indeterminate.  2. Right ventricular systolic function is normal. The right ventricular size is normal.  3. Left atrial size was mildly dilated.  4. The mitral valve is normal in structure. Mild mitral valve regurgitation. No evidence of mitral stenosis.  5. The aortic valve was not well visualized. Aortic valve regurgitation is not visualized. Aortic valve sclerosis/calcification is present, without any evidence of aortic stenosis. Comparison(s): Prior images unable to be directly viewed, comparison made by report only. Conclusion(s)/Recommendation(s): Moderately reduced LVEF with global hypokinesis. FINDINGS  Left Ventricle: Left ventricular ejection fraction, by estimation, is 35 to 40%. The left ventricle has moderately decreased function. The left ventricle demonstrates global hypokinesis. Definity  contrast agent was given IV to delineate the left ventricular endocardial borders. The left ventricular internal cavity size was moderately to severely dilated. There is mild concentric left ventricular hypertrophy. Left ventricular diastolic parameters are indeterminate. Right Ventricle: The right ventricular size is normal. Right vetricular wall thickness was not well visualized. Right ventricular systolic function is normal. Left Atrium: Left atrial size was mildly dilated. Right Atrium: Right atrial size was normal in size. Pericardium: There is no evidence of pericardial effusion. Mitral Valve: The mitral valve is normal in structure. Mild mitral valve regurgitation. No  evidence of mitral valve stenosis. Tricuspid Valve: The tricuspid valve is not well visualized. Tricuspid valve regurgitation is trivial. No evidence of tricuspid stenosis. Aortic Valve: The aortic valve was not well visualized. Aortic valve regurgitation is not  visualized. Aortic valve sclerosis/calcification is present, without any evidence of aortic stenosis. Aortic valve peak gradient measures 5.3 mmHg. Pulmonic Valve: The pulmonic valve was not well visualized. Pulmonic valve regurgitation is not visualized. Aorta: The aortic root, ascending aorta and aortic arch are all structurally normal, with no evidence of dilitation or obstruction. Venous: The inferior vena cava was not well visualized. IAS/Shunts: The atrial septum is grossly normal.  LEFT VENTRICLE PLAX 2D LVIDd:         6.10 cm      Diastology LVIDs:         5.19 cm      LV e' medial:    14.10 cm/s LV PW:         1.20 cm      LV E/e' medial:  6.6 LV IVS:        1.30 cm      LV e' lateral:   13.70 cm/s LVOT diam:     2.30 cm      LV E/e' lateral: 6.8 LV SV:         49 LV SV Index:   18 LVOT Area:     4.15 cm  LV Volumes (MOD) LV vol d, MOD A2C: 113.0 ml LV vol d, MOD A4C: 158.0 ml LV vol s, MOD A4C: 85.8 ml LV SV MOD A4C:     158.0 ml RIGHT VENTRICLE RV Basal diam:  2.90 cm RV S prime:     9.46 cm/s TAPSE (M-mode): 2.1 cm LEFT ATRIUM             Index        RIGHT ATRIUM           Index LA diam:        4.70 cm 1.71 cm/m   RA Area:     16.50 cm LA Vol (A2C):   63.3 ml 23.03 ml/m  RA Volume:   33.20 ml  12.08 ml/m LA Vol (A4C):   79.2 ml 28.82 ml/m LA Biplane Vol: 74.6 ml 27.14 ml/m  AORTIC VALVE AV Area (Vmax): 2.90 cm AV Vmax:        115.50 cm/s AV Peak Grad:   5.3 mmHg LVOT Vmax:      80.60 cm/s LVOT Vmean:     52.100 cm/s LVOT VTI:       0.118 m  AORTA Ao Root diam: 3.50 cm MITRAL VALVE               TRICUSPID VALVE MV Area (PHT): 4.29 cm    TR Peak grad:   16.2 mmHg MV Decel Time: 177 msec    TR Vmax:        201.00 cm/s MV E velocity: 93.30 cm/s                            SHUNTS                            Systemic VTI:  0.12 m                            Systemic Diam: 2.30 cm Bridgette  Lonni MD Electronically signed by Shelda Lonni MD Signature Date/Time: 10/13/2024/4:25:57 PM    Final     US  Venous Img Lower Bilateral (DVT) Result Date: 10/12/2024 CLINICAL DATA:  Lower extremity edema. EXAM: BILATERAL LOWER EXTREMITY VENOUS DOPPLER ULTRASOUND TECHNIQUE: Gray-scale sonography with graded compression, as well as color Doppler and duplex ultrasound were performed to evaluate the lower extremity deep venous systems from the level of the common femoral vein and including the common femoral, femoral, profunda femoral, popliteal and calf veins including the posterior tibial, peroneal and gastrocnemius veins when visible. Spectral Doppler was utilized to evaluate flow at rest and with distal augmentation maneuvers in the common femoral, femoral and popliteal veins. COMPARISON:  None Available. FINDINGS: RIGHT LOWER EXTREMITY Common Femoral Vein: No evidence of thrombus. Normal compressibility, respiratory phasicity and response to augmentation. Saphenofemoral Junction: No evidence of thrombus. Normal compressibility and flow on color Doppler imaging. Profunda Femoral Vein: No evidence of thrombus. Normal compressibility and flow on color Doppler imaging. Femoral Vein: No evidence of thrombus. Normal compressibility, respiratory phasicity and response to augmentation. Popliteal Vein: No evidence of thrombus. Normal compressibility, respiratory phasicity and response to augmentation. Calf Veins: No evidence of thrombus. Normal compressibility and flow on color Doppler imaging. Other Findings:  None. LEFT LOWER EXTREMITY Common Femoral Vein: No evidence of thrombus. Normal compressibility, respiratory phasicity and response to augmentation. Saphenofemoral Junction: No evidence of thrombus. Normal compressibility and flow on color Doppler imaging. Profunda Femoral Vein: No evidence of thrombus. Normal compressibility and flow on color Doppler imaging. Femoral Vein: No evidence of thrombus. Normal compressibility, respiratory phasicity and response to augmentation. Popliteal Vein: No evidence of thrombus.  Normal compressibility, respiratory phasicity and response to augmentation. Calf Veins: No evidence of thrombus. Normal compressibility and flow on color Doppler imaging. Other Findings:  None. IMPRESSION: No evidence of deep venous thrombosis in either lower extremity. Electronically Signed   By: Juliene Balder M.D.   On: 10/12/2024 11:28   DG Tibia/Fibula Right Result Date: 10/11/2024 CLINICAL DATA:  Right lower extremity cellulitis. EXAM: RIGHT TIBIA AND FIBULA - 2 VIEW COMPARISON:  Right knee radiograph dated 06/20/2017. 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 The arthroplasty components appear intact and in anatomic alignment. There is no acute fracture or dislocation. Degenerative changes of the ankle. There is a small suprapatellar effusion. Mild diffuse subcutaneous edema. No opaque foreign object/gas. IMPRESSION: 1. No acute fracture or dislocation. 2. Small suprapatellar effusion. Electronically Signed   By: Vanetta Chou M.D.   On: 10/11/2024 13:55   DG Chest 2 View Result Date: 10/11/2024 EXAM: 2 VIEW(S) XRAY OF THE CHEST 10/11/2024 11:05:00 AM COMPARISON: CXR 12/22/2022. CLINICAL HISTORY: sob FINDINGS: LUNGS AND PLEURA: Low lung volumes. No focal pulmonary opacity. No pleural effusion. No pneumothorax. HEART AND MEDIASTINUM: Unchanged cardiomediastinal silhouette. BONES AND SOFT TISSUES: Partially imaged right shoulder arthroplasty noted. Thoracic spondylosis. No acute osseous abnormality. IMPRESSION: 1. Low lung volumes. Electronically signed by: Morgane Naveau MD 10/11/2024 12:22 PM EST RP Workstation: [id number]    Microbiology: Results for orders placed or performed during the hospital encounter of 10/11/24  Blood Culture (routine x 2)     Status: None   Collection Time: 10/11/24 11:42 AM   Specimen: BLOOD  Result Value Ref Range Status   Specimen Description BLOOD BLOOD LEFT FOREARM  Final   Special Requests   Final    BOTTLES DRAWN AEROBIC AND ANAEROBIC Blood Culture  adequate volume   Culture   Final    NO GROWTH  5 DAYS Performed at [facility], [address] [place], [place] [address]    Report Status 10/16/2024 FINAL  Final  Resp panel by RT-PCR (RSV, Flu A&B, Covid) Anterior Nasal Swab     Status: None   Collection Time: 10/11/24 12:03 PM   Specimen: Anterior Nasal Swab  Result Value Ref Range Status   SARS Coronavirus 2 by RT PCR NEGATIVE NEGATIVE Final    Comment: (NOTE) SARS-CoV-2 target nucleic acids are NOT DETECTED.  The SARS-CoV-2 RNA is generally detectable in upper respiratory specimens during the acute phase of infection. The lowest concentration of SARS-CoV-2 viral copies this assay can detect is 138 copies/mL. A negative result does not preclude SARS-Cov-2 infection and should not be used as the sole basis for treatment or other patient management decisions. A negative result may occur with  improper specimen collection/handling, submission of specimen other than nasopharyngeal swab, presence of viral mutation(s) within the areas targeted by this assay, and inadequate number of viral copies(<138 copies/mL). A negative result must be combined with clinical observations, patient history, and epidemiological information. The expected result is Negative.  Fact Sheet for Patients:  bloggercourse.com  Fact Sheet for Healthcare Providers:  seriousbroker.it  This test is no t yet approved or cleared by the [place]  FDA and  has been authorized for detection and/or diagnosis of SARS-CoV-2 by FDA under an Emergency Use Authorization (EUA). This EUA will remain  in effect (meaning this test can be used) for the duration of the COVID-19 declaration under Section 564(b)(1) of the Act, 21 U.S.C.section 360bbb-3(b)(1), unless the authorization is terminated  or revoked sooner.       Influenza A by PCR NEGATIVE NEGATIVE Final   Influenza B by PCR NEGATIVE NEGATIVE Final     Comment: (NOTE) The Xpert Xpress SARS-CoV-2/FLU/RSV plus assay is intended as an aid in the diagnosis of influenza from Nasopharyngeal swab specimens and should not be used as a sole basis for treatment. Nasal washings and aspirates are unacceptable for Xpert Xpress SARS-CoV-2/FLU/RSV testing.  Fact Sheet for Patients: bloggercourse.com  Fact Sheet for Healthcare Providers: seriousbroker.it  This test is not yet approved or cleared by the [place]  FDA and has been authorized for detection and/or diagnosis of SARS-CoV-2 by FDA under an Emergency Use Authorization (EUA). This EUA will remain in effect (meaning this test can be used) for the duration of the COVID-19 declaration under Section 564(b)(1) of the Act, 21 U.S.C. section 360bbb-3(b)(1), unless the authorization is terminated or revoked.     Resp Syncytial Virus by PCR NEGATIVE NEGATIVE Final    Comment: (NOTE) Fact Sheet for Patients: bloggercourse.com  Fact Sheet for Healthcare Providers: seriousbroker.it  This test is not yet approved or cleared by the [place]  FDA and has been authorized for detection and/or diagnosis of SARS-CoV-2 by FDA under an Emergency Use Authorization (EUA). This EUA will remain in effect (meaning this test can be used) for the duration of the COVID-19 declaration under Section 564(b)(1) of the Act, 21 U.S.C. section 360bbb-3(b)(1), unless the authorization is terminated or revoked.  Performed at [facility], [address] [place], [place] [address]   Blood Culture (routine x 2)     Status: None   Collection Time: 10/11/24 12:03 PM   Specimen: BLOOD  Result Value Ref Range Status   Specimen Description BLOOD RIGHT HAND  Final   Special Requests   Final    BOTTLES DRAWN AEROBIC AND ANAEROBIC Blood Culture results may not be  optimal due to an inadequate volume of  blood received in culture bottles   Culture   Final    NO GROWTH 5 DAYS Performed at [facility], [address] [place], [place] [address]    Report Status 10/16/2024 FINAL  Final  C Difficile Quick Screen w PCR reflex     Status: Abnormal   Collection Time: 10/12/24  7:28 AM   Specimen: STOOL  Result Value Ref Range Status   C Diff antigen POSITIVE (A) NEGATIVE Final   C Diff toxin POSITIVE (A) NEGATIVE Final   C Diff interpretation Toxin producing C. difficile detected.  Final    Comment: CRITICAL RESULT CALLED TO, READ BACK BY AND VERIFIED WITH: VALYANA DUNKLEY @1240  10/12/24 MJU Performed at [facility], [address] [place], [place] [address]     Labs: CBC: Recent Labs  Lab 10/11/24 1051 10/12/24 0510 10/14/24 0524 10/16/24 0452  WBC 14.2* 11.6* 9.4 10.0  HGB 12.5* 12.0* 12.4* 14.5  HCT 37.5* 36.0* 36.9* 43.8  MCV 90.8 92.8 90.4 90.3  PLT 292 274 292 299   Basic Metabolic Panel: Recent Labs  Lab 10/11/24 1051 10/12/24 0510 10/13/24 1400 10/14/24 0524 10/15/24 0604 10/16/24 0452  NA 137 139  --   --  139 141  K 3.5 3.6  --   --  3.0* 3.2*  CL 101 102  --   --  101 101  CO2 24 24  --   --  23 25  GLUCOSE 122* 88  --   --  92 103*  BUN 14 13  --   --  11 16  CREATININE 0.96 0.92 0.85 0.90 0.85 1.00  CALCIUM  8.7* 8.2*  --   --  8.8* 8.7*  MG 2.2  --   --   --  1.9  --    Liver Function Tests: Recent Labs  Lab 10/11/24 1051  AST 31  ALT 31  ALKPHOS 97  BILITOT 0.7  PROT 7.1  ALBUMIN 3.4*   CBG: Recent Labs  Lab 10/12/24 0822 10/13/24 0741 10/14/24 0822 10/15/24 0812 10/16/24 0921  GLUCAP 92 80 92 98 191*    Discharge time spent: greater than 30 minutes.  Signed: Charlie Patterson, MD [facility] 10/16/2024 "

## 2024-10-16 NOTE — Plan of Care (Signed)

## 2024-10-16 NOTE — TOC Progression Note (Addendum)
 Transition of Care [facility]) - Progression Note    Patient Details  Name: PUNEET MASONER MRN: [id number] Date of Birth: 10/18/1955  Transition of Care [facility]) CM/SW Contact  Adelyna Brockman  [place], [place] Phone Number: 10/16/2024, 11:43 AM  Clinical Narrative:   [employer] are requesting that the patient has updated PT notes with him mobilizing out of bed before they start his insurance auth. LCSWA notified PT.   TOC to follow for discharge.                       Expected Discharge Plan and Services                                               Social Drivers of Health (SDOH) Interventions SDOH Screenings   Food Insecurity: No Food Insecurity (10/11/2024)  Housing: Low Risk (10/11/2024)  Transportation Needs: No Transportation Needs (10/11/2024)  Utilities: Not At Risk (10/11/2024)  Alcohol Screen: Low Risk (09/04/2024)  Depression (PHQ2-9): Medium Risk (10/09/2024)  Financial Resource Strain: Low Risk (09/04/2024)  Physical Activity: Insufficiently Active (09/04/2024)  Social Connections: Moderately Isolated (10/11/2024)  Stress: No Stress Concern Present (09/04/2024)  Tobacco Use: Low Risk (10/11/2024)  Health Literacy: Adequate Health Literacy (09/04/2024)    Readmission Risk Interventions     No data to display

## 2024-10-16 NOTE — TOC Transition Note (Addendum)
 Transition of Care [facility]) - Discharge Note   Patient Details  Name: Johnathan [name] MRN: [id number] Date of Birth: 12-Nov-1955  [facility]) CM/SW Contact:  Alvaro Louder, LCSW Phone Number: 10/16/2024, 3:02 PM   Clinical Narrative:   LCSWA received insurance approval for patient to admit to SNF [facility]. LCSWA confirmed with MD that patient is stable for discharge. LCSWA notified the patient and spouse and they are in agreement with discharge . LCSWA confirmed bed is available at [facility] Transport arranged with [facility] for next available.    RM: [phone], Number to call report [phone]   [facility] signing off  Final next level of care: Skilled Nursing Facility Barriers to Discharge: No Barriers Identified   Patient Goals and CMS Choice            Discharge Placement              Patient chooses bed at: [facility] Patient to be transferred to facility by: [facility] Name of family member notified: Self/Spouse Patient and family notified of of transfer: 10/16/24  Discharge Plan and Services Additional resources added to the After Visit Summary for                                       Social Drivers of Health (SDOH) Interventions SDOH Screenings   Food Insecurity: No Food Insecurity (10/11/2024)  Housing: Low Risk (10/11/2024)  Transportation Needs: No Transportation Needs (10/11/2024)  Utilities: Not At Risk (10/11/2024)  Alcohol Screen: Low Risk (09/04/2024)  Depression (PHQ2-9): Medium Risk (10/09/2024)  Financial Resource Strain: Low Risk (09/04/2024)  Physical Activity: Insufficiently Active (09/04/2024)  Social Connections: Moderately Isolated (10/11/2024)  Stress: No Stress Concern Present (09/04/2024)  Tobacco Use: Low Risk (10/11/2024)  Health Literacy: Adequate Health Literacy (09/04/2024)     Readmission Risk Interventions     No data to display

## 2024-10-16 NOTE — Plan of Care (Signed)

## 2024-10-16 NOTE — Progress Notes (Signed)
 "  Rounding Note   Patient Name: Johnathan [name] Date of Encounter: 10/16/2024  [facility] Cardiologist: Evalene Lunger, MD   Subjective Denies any chest pain or shortness of breath.  No palpitations or dizziness.  Remains in atrial fibrillation with ventricular rates in the 90s to low 100s bpm.  Reports ongoing watery diarrhea.  Scheduled Meds:  doxycycline   100 mg Oral Q12H   [START ON 10/17/2024] furosemide   20 mg Oral Daily   levothyroxine   25 mcg Oral Q0600   metoprolol  succinate  100 mg Oral BID   rosuvastatin   10 mg Oral Daily   sacubitril -valsartan   1 tablet Oral BID   sodium chloride  flush  3 mL Intravenous Q12H   spironolactone   25 mg Oral Daily   vancomycin   125 mg Oral QID   venlafaxine  XR  300 mg Oral Q breakfast   Warfarin - Pharmacist Dosing Inpatient   Does not apply q1600   Continuous Infusions:   PRN Meds: acetaminophen  **OR** acetaminophen , ondansetron  **OR** ondansetron  (ZOFRAN ) IV, oxyCODONE , senna-docusate   Vital Signs  Vitals:   10/15/24 1514 10/15/24 2015 10/16/24 0441 10/16/24 0832  BP: 108/78 126/87 (!) 132/95 118/87  Pulse: 96 94 83 83  Resp: 17 18 18 18   Temp: 98.4 F (36.9 C) 98.4 F (36.9 C) 98.4 F (36.9 C) 97.9 F (36.6 C)  TempSrc:  Oral Oral   SpO2: 97% 98% 97% 94%  Weight:        Intake/Output Summary (Last 24 hours) at 10/16/2024 1116 Last data filed at 10/16/2024 0900 Gross per 24 hour  Intake 570 ml  Output 550 ml  Net 20 ml      10/15/2024    5:20 AM 10/12/2024    5:01 AM 10/11/2024    4:49 PM  Last 3 Weights  Weight (lbs) 339 lb 1.1 oz 339 lb 11.7 oz 339 lb 8.1 oz  Weight (kg) 153.8 kg 154.1 kg 154 kg      Telemetry Atrial fibrillation with ventricular rates in the 90s to low 100s bpm with rare PVC versus aberrancy- Personally Reviewed  ECG  No new tracings - Personally Reviewed  Physical Exam  GEN: No acute distress Neck: Unable to determine JVD due to body habitus and facial hair Cardiac: IRIR, no  murmurs, rubs, or gallops.  Respiratory: Clear to auscultation bilaterally GI: Soft, obese, nontender, non-distended  MS: Lower extremities with wraps Neuro:  Nonfocal  Psych: Normal affect   Labs High Sensitivity Troponin:  No results for input(s): TROPONINIHS in the last 720 hours. No results for input(s): TRNPT in the last 720 hours.     Chemistry Recent Labs  Lab 10/11/24 1051 10/12/24 0510 10/13/24 1400 10/14/24 0524 10/15/24 0604 10/16/24 0452  NA 137 139  --   --  139 141  K 3.5 3.6  --   --  3.0* 3.2*  CL 101 102  --   --  101 101  CO2 24 24  --   --  23 25  GLUCOSE 122* 88  --   --  92 103*  BUN 14 13  --   --  11 16  CREATININE 0.96 0.92   < > 0.90 0.85 1.00  CALCIUM  8.7* 8.2*  --   --  8.8* 8.7*  MG 2.2  --   --   --  1.9  --   PROT 7.1  --   --   --   --   --   ALBUMIN  3.4*  --   --   --   --   --   AST 31  --   --   --   --   --   ALT 31  --   --   --   --   --   ALKPHOS 97  --   --   --   --   --   BILITOT 0.7  --   --   --   --   --   GFRNONAA >60 >60   < > >60 >60 >60  ANIONGAP 12 13  --   --  15 15   < > = values in this interval not displayed.    Lipids No results for input(s): CHOL, TRIG, HDL, LABVLDL, LDLCALC, CHOLHDL in the last 168 hours.  Hematology Recent Labs  Lab 10/12/24 0510 10/14/24 0524 10/16/24 0452  WBC 11.6* 9.4 10.0  RBC 3.88* 4.08* 4.85  HGB 12.0* 12.4* 14.5  HCT 36.0* 36.9* 43.8  MCV 92.8 90.4 90.3  MCH 30.9 30.4 29.9  MCHC 33.3 33.6 33.1  RDW 14.0 13.8 13.6  PLT 274 292 299   Thyroid  No results for input(s): TSH, FREET4 in the last 168 hours.  BNPNo results for input(s): BNP, PROBNP in the last 168 hours.  DDimer No results for input(s): DDIMER in the last 168 hours.   Radiology    Cardiac Studies 2d echo 10/13/2024 1. Left ventricular ejection fraction, by estimation, is 35 to 40%. The  left ventricle has moderately decreased function. The left ventricle  demonstrates global hypokinesis. The  left ventricular internal cavity size  was moderately to severely dilated.  There is mild concentric left ventricular hypertrophy. Left ventricular  diastolic parameters are indeterminate.   2. Right ventricular systolic function is normal. The right ventricular  size is normal.   3. Left atrial size was mildly dilated.   4. The mitral valve is normal in structure. Mild mitral valve  regurgitation. No evidence of mitral stenosis.   5. The aortic valve was not well visualized. Aortic valve regurgitation  is not visualized. Aortic valve sclerosis/calcification is present,  without any evidence of aortic stenosis.   Patient Profile   69 y.o. male with a history of persistent atrial fibrillation originally diagnosed in 05/2012, morbid obesity, hypertension, history of DVT (right lower extremity, 2004), OSA not on CPAP, chronic joint pain, chronic HFmrEF/dilated cardiomyopathy, hypothyroidism, chronic venous insufficiency, who is being seen and evaluated for atrial fibrillation with RVR.  Assessment & Plan  Persistent atrial fibrillation -Initially diagnosed with atrial fibrillation in 2013, cardioverted x 1, presented back to the hospital in 2016 from a tick bite and was found to be back in atrial fibrillation, he is noted to be in paroxysmal atrial fibrillation until around July 2024 and since that time review of EKGs he has maintained atrial fibrillation -Previously in July 2024 discussed therapeutic INRs for potential repeat cardioversion procedure patient was not compliant with follow-ups in the Coumadin  clinic -He has been continued on warfarin with ongoing monitoring dose adjustment per pharmacy protocol with daily INR -Reports hives to apixaban  and rivaroxaban -INR this morning 3.8 -Continued on metoprolol  tartrate 100 mg twice a daily -Heart rates ranging from upper 90s to low 100s bpm - Given chronicity of A-fib, suspect he has transitioned to permanent A-fib -Continue with telemetry  monitoring  HFrEF/dilated cardiomyopathy -Echo in 2016 with LVEF of 40-45% -Endorses chronic shortness of breath that is unchanged -Echo this admission with  an LVEF of 35-40%, possibly tachycardia mediated from his atrial fibrillation,  -Started on Entresto  24/26 mg twice daily on 1/4 and spironolactone  25 mg on 1/5 -Now back on Toprol -XL 100 mg twice daily -Escalate GDMT further as able with possible addition of SGLT2i, will defer at this time given continued volume depletion through watery diarrhea -Recommend repeat limited echo in several months with adequate rate control to reassess LVSF, if cardiomyopathy persists would pursue ischemic evaluation  -Negative 1.4 L output in the last 24 hours and net - 2.2 L for the admission  -Volume status difficult to assess due to body habitus -Continue with daily weights and I's and O's  C. difficile colitis -Stool samples positive for C. Difficile -Oral antibiotic treatment for 10 days with vancomycin  -Ongoing management per IM  Primary hypertension -Blood pressure reasonably controlled  -Continue current medication regimen -Vital signs per unit protocol  Hyperlipidemia -LDL 50 -Continue on statin therapy  Obstructive sleep apnea -Continues to decline CPAP therapy -Contributing to recurrent Afib -Ongoing management per IM  Obesity -BMI of 42.46 -Weight loss would be beneficial  8. Hypokalemia: -Started on spironolactone     For questions or updates, please contact [facility] Please consult [email or web address] for contact info under       Signed, Bernardino Bring, PA-C  10/16/2024, 11:16 AM    "

## 2024-10-16 NOTE — Progress Notes (Signed)
 Called report to the facility. Report given to Lotan, nurse in the facility. Pt is ready to be transported, waiting for the EMS. According to CWS pt needs EMS truck so we are still waiting.

## 2024-11-02 ENCOUNTER — Ambulatory Visit: Admitting: Family

## 2024-11-03 ENCOUNTER — Emergency Department

## 2024-11-03 ENCOUNTER — Inpatient Hospital Stay
Admission: EM | Admit: 2024-11-03 | Discharge: 2024-11-11 | Disposition: A | Attending: Hospitalist | Admitting: Hospitalist

## 2024-11-03 ENCOUNTER — Other Ambulatory Visit: Payer: Self-pay

## 2024-11-03 DIAGNOSIS — E872 Acidosis, unspecified: Secondary | ICD-10-CM | POA: Diagnosis not present

## 2024-11-03 DIAGNOSIS — I4891 Unspecified atrial fibrillation: Secondary | ICD-10-CM | POA: Diagnosis present

## 2024-11-03 DIAGNOSIS — R296 Repeated falls: Secondary | ICD-10-CM | POA: Diagnosis not present

## 2024-11-03 DIAGNOSIS — E785 Hyperlipidemia, unspecified: Secondary | ICD-10-CM | POA: Insufficient documentation

## 2024-11-03 DIAGNOSIS — N179 Acute kidney failure, unspecified: Secondary | ICD-10-CM | POA: Diagnosis not present

## 2024-11-03 DIAGNOSIS — I482 Chronic atrial fibrillation, unspecified: Secondary | ICD-10-CM | POA: Diagnosis not present

## 2024-11-03 DIAGNOSIS — I5022 Chronic systolic (congestive) heart failure: Secondary | ICD-10-CM | POA: Insufficient documentation

## 2024-11-03 DIAGNOSIS — S022XXA Fracture of nasal bones, initial encounter for closed fracture: Secondary | ICD-10-CM

## 2024-11-03 DIAGNOSIS — W19XXXA Unspecified fall, initial encounter: Principal | ICD-10-CM

## 2024-11-03 DIAGNOSIS — E039 Hypothyroidism, unspecified: Secondary | ICD-10-CM | POA: Insufficient documentation

## 2024-11-03 DIAGNOSIS — R9431 Abnormal electrocardiogram [ECG] [EKG]: Secondary | ICD-10-CM

## 2024-11-03 DIAGNOSIS — F32A Depression, unspecified: Secondary | ICD-10-CM | POA: Insufficient documentation

## 2024-11-03 LAB — CBC WITH DIFFERENTIAL/PLATELET
Abs Immature Granulocytes: 0.04 10*3/uL (ref 0.00–0.07)
Basophils Absolute: 0 10*3/uL (ref 0.0–0.1)
Basophils Relative: 0 %
Eosinophils Absolute: 0.1 10*3/uL (ref 0.0–0.5)
Eosinophils Relative: 1 %
HCT: 45.9 % (ref 39.0–52.0)
Hemoglobin: 14.9 g/dL (ref 13.0–17.0)
Immature Granulocytes: 0 %
Lymphocytes Relative: 29 %
Lymphs Abs: 2.8 10*3/uL (ref 0.7–4.0)
MCH: 29.4 pg (ref 26.0–34.0)
MCHC: 32.5 g/dL (ref 30.0–36.0)
MCV: 90.5 fL (ref 80.0–100.0)
Monocytes Absolute: 1 10*3/uL (ref 0.1–1.0)
Monocytes Relative: 11 %
Neutro Abs: 5.5 10*3/uL (ref 1.7–7.7)
Neutrophils Relative %: 59 %
Platelets: 180 10*3/uL (ref 150–400)
RBC: 5.07 MIL/uL (ref 4.22–5.81)
RDW: 15 % (ref 11.5–15.5)
WBC: 9.4 10*3/uL (ref 4.0–10.5)
nRBC: 0 % (ref 0.0–0.2)

## 2024-11-03 LAB — BASIC METABOLIC PANEL WITH GFR
Anion gap: 9 (ref 5–15)
BUN: 38 mg/dL — ABNORMAL HIGH (ref 8–23)
CO2: 14 mmol/L — ABNORMAL LOW (ref 22–32)
Calcium: 6.2 mg/dL — CL (ref 8.9–10.3)
Chloride: 118 mmol/L — ABNORMAL HIGH (ref 98–111)
Creatinine, Ser: 1.66 mg/dL — ABNORMAL HIGH (ref 0.61–1.24)
GFR, Estimated: 44 mL/min — ABNORMAL LOW
Glucose, Bld: 92 mg/dL (ref 70–99)
Potassium: 3.6 mmol/L (ref 3.5–5.1)
Sodium: 142 mmol/L (ref 135–145)

## 2024-11-03 LAB — URINALYSIS, W/ REFLEX TO CULTURE (INFECTION SUSPECTED)
Bilirubin Urine: NEGATIVE
Glucose, UA: NEGATIVE mg/dL
Hgb urine dipstick: NEGATIVE
Ketones, ur: NEGATIVE mg/dL
Leukocytes,Ua: NEGATIVE
Nitrite: NEGATIVE
Protein, ur: NEGATIVE mg/dL
Specific Gravity, Urine: 1.021 (ref 1.005–1.030)
pH: 5 (ref 5.0–8.0)

## 2024-11-03 LAB — PROTIME-INR
INR: 1.6 — ABNORMAL HIGH (ref 0.8–1.2)
Prothrombin Time: 20 s — ABNORMAL HIGH (ref 11.4–15.2)

## 2024-11-03 MED ORDER — SODIUM CHLORIDE 0.9 % IV SOLN: INTRAVENOUS | Status: DC

## 2024-11-03 MED ORDER — CALCIUM GLUCONATE-NACL 1-0.675 GM/50ML-% IV SOLN
1.0000 g | Freq: Once | INTRAVENOUS | Status: AC
  Administered 2024-11-04: 1000 mg via INTRAVENOUS
  Filled 2024-11-03: qty 50

## 2024-11-03 MED ORDER — SODIUM CHLORIDE 0.9 % IV BOLUS
1000.0000 mL | Freq: Once | INTRAVENOUS | Status: AC
  Administered 2024-11-03: 1000 mL via INTRAVENOUS

## 2024-11-03 MED ORDER — SODIUM CHLORIDE 0.9 % IV BOLUS (SEPSIS)
1000.0000 mL | Freq: Once | INTRAVENOUS | Status: AC
  Administered 2024-11-04: 1000 mL via INTRAVENOUS

## 2024-11-03 NOTE — ED Notes (Signed)
 Pt I&O cath'd utilizing 14 Fr soft tip cath. Pt tolerated cath poorly, some blood with clots noted at when removing cath. Pt cleaned and brief placed to catch residual drainage. MD notified. 350 mL of amber urine drained.

## 2024-11-03 NOTE — ED Provider Notes (Signed)
 "  Johnathan [name] Provider Note    Event Date/Time   First MD Initiated Contact with Patient 11/03/24 2057     (approximate)   History   Chief Complaint: Fall   HPI  Johnathan [name] is a 69 y.o. male with history of atrial fibrillation, CHF, who is sent to the ED due to altered mental status from [facility].  Had 2 falls today.  Patient denies any recollection of the falls.  When asked what happened he reports that he was driving his daughter to [place].  Denies any pain currently.  Denies any motor weakness or paresthesias, no shortness of breath vision changes or headache.        Past Medical History:  Diagnosis Date   Arthritis    a. chronic joint pain   B12 deficiency    Chronic systolic CHF (congestive heart failure) (HCC)    a. echo 2013: EF of 50-55%, normal right ventricular systolic pressure, normal left atrium; b. EF 40-45%, inadequate for LV wall motion, mild MR, LA severely dilated @ 52 mm, PASP nl   Complication of anesthesia    Dysrhythmia    Erythema migrans (Lyme disease) 04/08/2015   History of DVT (deep vein thrombosis) 2004   s/p bariatric surgery   History of stress test    a. there was no ST segment deviation noted during stress,    There is a small defect of moderate severity present in the apex location, suggestive of apical ischemia, intermediate risk, calculated EF 21% but visually appears to be 35-40%   Hypertension    Hypothyroidism    Neuropathy    Obesity    OSA (obstructive sleep apnea)    a. not compliant with CPAP   PAF (paroxysmal atrial fibrillation) (HCC)    a. CHADSVASc at least 2 (HTN and vascular disease); b. on Eliquis     Pre-diabetes     Current Outpatient Rx   Order #: [id number] Class: OTC   Order #: [id number] Class: Normal   Order #: [id number] Class: Print   Order #: [id number] Class: Print   Order #: [id number] Class: Normal   Order #: [id number] Class: Print   Order #: [id number] Class: Historical Med    Order #: [id number] Class: Print   Order #: [id number] Class: Normal   Order #: [id number] Class: Print   Order #: [id number] Class: Print   Order #: [id number] Class: Normal   Order #: [id number] Class: Historical Med   Order #: [id number] Class: Historical Med   Order #: [id number] Class: Print    Past Surgical History:  Procedure Laterality Date   ARTHROSCOPIC REPAIR ACL Bilateral 1993   bilateral knee x2 on left and x2 on right   BARIATRIC SURGERY  2004   Rou en Y    BARIATRIC SURGERY     CHOLECYSTECTOMY  1982   ELECTROPHYSIOLOGIC STUDY N/A 06/20/2015   Procedure: CARDIOVERSION;  Surgeon: Evalene JINNY Lunger, MD;  Location: [facility] ORS;  Service: Cardiovascular;  Laterality: N/A;   GANGLION CYST EXCISION     KNEE ARTHROPLASTY Right 06/20/2017   Procedure: COMPUTER ASSISTED TOTAL KNEE ARTHROPLASTY;  Surgeon: Mardee Lynwood SQUIBB, MD;  Location: [facility] ORS;  Service: Orthopedics;  Laterality: Right;   MENISCUS DEBRIDEMENT Left 2011   Left knee   REPLACEMENT TOTAL KNEE Left 07/2013   REVERSE SHOULDER ARTHROPLASTY Right 12/30/2022   Procedure: REVERSE SHOULDER ARTHROPLASTY;  Surgeon: Dozier Soulier, MD;  Location: [facility] ORS;  Service: Orthopedics;  Laterality: Right;   TONSILLECTOMY      Physical Exam   Triage Vital Signs:  ED Triage Vitals  Encounter Vitals Group     BP      Girls Systolic BP Percentile      Girls Diastolic BP Percentile      Boys Systolic BP Percentile      Boys Diastolic BP Percentile      Pulse      Resp      Temp      Temp src      SpO2      Weight      Height      Head Circumference      [facility]      Pain Score      Pain Loc      Pain Education      Exclude from Growth Chart     Most recent vital signs: Vitals:   11/03/24 2104  BP: (!) 113/95  Pulse: 98  Resp: 17  Temp: 98.1 F (36.7 C)  SpO2: 95%    General: Awake, no distress.  CV:  Good peripheral perfusion.  Irregular rhythm, heart rate 95 Resp:  Normal effort.  Clear lungs Abd:  No distention.  Soft  nontender Other:  No midline spinal tenderness.  Small abrasion over the bridge of the nose.  Abrasion at the right lateral ankle without bony tenderness deformity laceration or instability.   ED Results / Procedures / Treatments   Labs (all labs ordered are listed, but only abnormal results are displayed) Labs Reviewed  PROTIME-INR - Abnormal; Notable for the following components:      Result Value   Prothrombin Time 20.0 (*)    INR 1.6 (*)    All other components within normal limits  CBC WITH DIFFERENTIAL/PLATELET  URINALYSIS, W/ REFLEX TO CULTURE (INFECTION SUSPECTED)  BASIC METABOLIC PANEL WITH GFR     EKG Interpreted by me Atrial fibrillation rate of 102.  Left axis, right bundle branch block, no acute ischemic changes.   RADIOLOGY CT head interpreted by me, no intracranial hemorrhage.  Does show a nasal bone fracture.  Radiology report reviewed CT cervical spine unremarkable   PROCEDURES:  Procedures   MEDICATIONS ORDERED IN ED: Medications  sodium chloride  0.9 % bolus 1,000 mL (0 mLs Intravenous Stopped 11/03/24 2306)     IMPRESSION / MDM / ASSESSMENT AND PLAN / ED COURSE  I reviewed the triage vital signs and the nursing notes.  DDx: Anemia, UTI, AKI, intracranial hemorrhage, C-spine fracture  Patient's presentation is most consistent with acute presentation with potential threat to life or bodily function.  Patient presents with unwitnessed fall at [facility].  Denies pain or other complaints, exam is benign except for small abrasion over the bridge of the nose.  Imaging is okay.  Awaiting urinalysis and chemistry.      FINAL CLINICAL IMPRESSION(S) / ED DIAGNOSES   Final diagnoses:  Fall, initial encounter  Closed fracture of nasal bone, initial encounter     Rx / DC Orders   ED Discharge Orders     None        Note:  This document was prepared using Dragon voice recognition software and may include unintentional dictation errors.    [name], MD 11/03/24 2316  "

## 2024-11-03 NOTE — ED Provider Notes (Signed)
 11:05 PM  Assumed care at shift change.  Patient here after an unwitnessed fall from his nursing facility.  Labs, urine pending.  Imaging shows acute right nasal bone fracture but no other acute traumatic injury.  11:46 PM  Pt's labs show bicarb of 14 with normal anion gap and blood glucose of 92, calcium  of 6.2, creatinine of 1.66, BUN of 38.  Will continue IV hydration, give IV calcium .  Will check albumin level, magnesium .  Patient on cardiac monitoring.  EKG shows atrial fibrillation right bundle branch block which are chronic for patient but also shows prolonged QT interval.  Likely from hypocalcemia.  Potassium normal.  Suspect metabolic acidosis is secondary to AKI and uremia.  Doubt DKA.  Doubt lactic acidosis, sepsis.  Will discuss with hospitalist for admission.  11:54 PM  Consulted and discussed patient's case with hospitalist, Dr. Lawence.  I have recommended admission and consulting physician agrees and will place admission orders.  Patient (and family if present) agree with this plan.   I reviewed all nursing notes, vitals, pertinent previous records.  All labs, EKGs, imaging ordered have been independently reviewed and interpreted by myself.   12:22 AM Pt's VBG is reassuring.  He has had some intermittent soft blood pressure and appears dry on exam.  Getting second liter fluid bolus.  No other signs or symptoms of sepsis.  Albumin is low today at 2.4.  Corrected calcium  is 7.5.  Family reports patient has been confused and hallucinating at his nursing facility.  This is different from his baseline.  No prior history of dementia.  He is awake and able to talk here but does appear drowsy and confused.  CT head reviewed and interpreted by myself and the radiologist and shows no acute abnormality other than acute nasal bone fracture.  UA shows no infection.  12:39 AM  Pt's lactic is normal.     CRITICAL CARE Performed by: Josette Sink   Total critical care time: 30  minutes  Critical care time was exclusive of separately billable procedures and treating other patients.  Critical care was necessary to treat or prevent imminent or life-threatening deterioration.  Critical care was time spent personally by me on the following activities: development of treatment plan with patient and/or surrogate as well as nursing, discussions with consultants, evaluation of patient's response to treatment, examination of patient, obtaining history from patient or surrogate, ordering and performing treatments and interventions, ordering and review of laboratory studies, ordering and review of radiographic studies, pulse oximetry and re-evaluation of patient's condition.    Ardian Haberland, Josette SAILOR, DO 11/03/24 2355    Rhemi Balbach, Josette SAILOR, DO 11/04/24 [phone]

## 2024-11-03 NOTE — ED Triage Notes (Signed)
 Pt here from [facility] after multiple unwitnessed falls (x2) and not acting right per EMS. Pt on warfarin. Unknown LOC, has abrasion with clot to bridge of nose, potential old cellulitis on the posterior right lower leg, and an abrasion with a large clot on right lateral ankle. Pt well appearing. Denies pain/CP/SOB, but notes N/V/D/fever. MD assessing pt during triage.

## 2024-11-04 ENCOUNTER — Other Ambulatory Visit: Payer: Self-pay

## 2024-11-04 DIAGNOSIS — I482 Chronic atrial fibrillation, unspecified: Secondary | ICD-10-CM

## 2024-11-04 DIAGNOSIS — I5022 Chronic systolic (congestive) heart failure: Secondary | ICD-10-CM | POA: Insufficient documentation

## 2024-11-04 DIAGNOSIS — E039 Hypothyroidism, unspecified: Secondary | ICD-10-CM | POA: Insufficient documentation

## 2024-11-04 DIAGNOSIS — N179 Acute kidney failure, unspecified: Secondary | ICD-10-CM | POA: Diagnosis not present

## 2024-11-04 DIAGNOSIS — R296 Repeated falls: Secondary | ICD-10-CM

## 2024-11-04 DIAGNOSIS — F32A Depression, unspecified: Secondary | ICD-10-CM | POA: Insufficient documentation

## 2024-11-04 DIAGNOSIS — E872 Acidosis, unspecified: Secondary | ICD-10-CM

## 2024-11-04 DIAGNOSIS — E785 Hyperlipidemia, unspecified: Secondary | ICD-10-CM | POA: Insufficient documentation

## 2024-11-04 LAB — CBC
HCT: 39 % (ref 39.0–52.0)
Hemoglobin: 12.7 g/dL — ABNORMAL LOW (ref 13.0–17.0)
MCH: 29.4 pg (ref 26.0–34.0)
MCHC: 32.6 g/dL (ref 30.0–36.0)
MCV: 90.3 fL (ref 80.0–100.0)
Platelets: 133 10*3/uL — ABNORMAL LOW (ref 150–400)
RBC: 4.32 MIL/uL (ref 4.22–5.81)
RDW: 15.1 % (ref 11.5–15.5)
WBC: 8.1 10*3/uL (ref 4.0–10.5)
nRBC: 0 % (ref 0.0–0.2)

## 2024-11-04 LAB — HEPATIC FUNCTION PANEL
ALT: 85 U/L — ABNORMAL HIGH (ref 0–44)
AST: 44 U/L — ABNORMAL HIGH (ref 15–41)
Albumin: 2.4 g/dL — ABNORMAL LOW (ref 3.5–5.0)
Alkaline Phosphatase: 80 U/L (ref 38–126)
Bilirubin, Direct: 0.1 mg/dL (ref 0.0–0.2)
Indirect Bilirubin: 0.2 mg/dL — ABNORMAL LOW (ref 0.3–0.9)
Total Bilirubin: 0.3 mg/dL (ref 0.0–1.2)
Total Protein: 4.5 g/dL — ABNORMAL LOW (ref 6.5–8.1)

## 2024-11-04 LAB — BLOOD GAS, VENOUS
Acid-base deficit: 4.8 mmol/L — ABNORMAL HIGH (ref 0.0–2.0)
Bicarbonate: 21.1 mmol/L (ref 20.0–28.0)
O2 Saturation: 57.4 %
Patient temperature: 37
pCO2, Ven: 41 mmHg — ABNORMAL LOW (ref 44–60)
pH, Ven: 7.32 (ref 7.25–7.43)
pO2, Ven: 36 mmHg (ref 32–45)

## 2024-11-04 LAB — LACTIC ACID, PLASMA: Lactic Acid, Venous: 1.3 mmol/L (ref 0.5–1.9)

## 2024-11-04 LAB — BASIC METABOLIC PANEL WITH GFR
Anion gap: 11 (ref 5–15)
BUN: 46 mg/dL — ABNORMAL HIGH (ref 8–23)
CO2: 22 mmol/L (ref 22–32)
Calcium: 8.7 mg/dL — ABNORMAL LOW (ref 8.9–10.3)
Chloride: 109 mmol/L (ref 98–111)
Creatinine, Ser: 1.89 mg/dL — ABNORMAL HIGH (ref 0.61–1.24)
GFR, Estimated: 38 mL/min — ABNORMAL LOW
Glucose, Bld: 112 mg/dL — ABNORMAL HIGH (ref 70–99)
Potassium: 4.3 mmol/L (ref 3.5–5.1)
Sodium: 141 mmol/L (ref 135–145)

## 2024-11-04 LAB — TSH: TSH: 2.48 u[IU]/mL (ref 0.350–4.500)

## 2024-11-04 LAB — MRSA NEXT GEN BY PCR, NASAL: MRSA by PCR Next Gen: NOT DETECTED

## 2024-11-04 LAB — PROTIME-INR
INR: 1.7 — ABNORMAL HIGH (ref 0.8–1.2)
Prothrombin Time: 20.7 s — ABNORMAL HIGH (ref 11.4–15.2)

## 2024-11-04 LAB — MAGNESIUM: Magnesium: 1.9 mg/dL (ref 1.7–2.4)

## 2024-11-04 MED ORDER — BUPROPION HCL ER (XL) 150 MG PO TB24
300.0000 mg | ORAL_TABLET | Freq: Every day | ORAL | Status: DC
  Administered 2024-11-04 – 2024-11-11 (×8): 300 mg via ORAL
  Filled 2024-11-04 (×8): qty 2

## 2024-11-04 MED ORDER — METOPROLOL SUCCINATE ER 50 MG PO TB24: 100.0000 mg | ORAL_TABLET | Freq: Two times a day (BID) | ORAL | Status: DC

## 2024-11-04 MED ORDER — ACETAMINOPHEN 650 MG RE SUPP: 650.0000 mg | Freq: Four times a day (QID) | RECTAL | Status: DC | PRN

## 2024-11-04 MED ORDER — SODIUM CHLORIDE 0.9 % IV SOLN: INTRAVENOUS | Status: AC

## 2024-11-04 MED ORDER — CALCIUM CARBONATE 1250 (500 CA) MG PO TABS
1.0000 | ORAL_TABLET | Freq: Three times a day (TID) | ORAL | Status: DC
  Administered 2024-11-04 – 2024-11-06 (×7): 1250 mg via ORAL
  Filled 2024-11-04 (×10): qty 1

## 2024-11-04 MED ORDER — METOPROLOL SUCCINATE ER 25 MG PO TB24
25.0000 mg | ORAL_TABLET | Freq: Two times a day (BID) | ORAL | Status: DC
  Administered 2024-11-04: 25 mg via ORAL
  Filled 2024-11-04: qty 1

## 2024-11-04 MED ORDER — VITAMIN E 45 MG (100 UNIT) PO CAPS
400.0000 [IU] | ORAL_CAPSULE | Freq: Every day | ORAL | Status: DC
  Administered 2024-11-04 – 2024-11-11 (×7): 400 [IU] via ORAL
  Filled 2024-11-04 (×8): qty 4

## 2024-11-04 MED ORDER — MAGNESIUM HYDROXIDE 400 MG/5ML PO SUSP: 30.0000 mL | Freq: Every day | ORAL | Status: DC | PRN

## 2024-11-04 MED ORDER — LEVOTHYROXINE SODIUM 25 MCG PO TABS
25.0000 ug | ORAL_TABLET | Freq: Every day | ORAL | Status: DC
  Administered 2024-11-04 – 2024-11-10 (×7): 25 ug via ORAL
  Filled 2024-11-04 (×7): qty 1

## 2024-11-04 MED ORDER — WARFARIN SODIUM 4 MG PO TABS
4.0000 mg | ORAL_TABLET | Freq: Once | ORAL | Status: AC
  Administered 2024-11-04: 4 mg via ORAL
  Filled 2024-11-04: qty 1

## 2024-11-04 MED ORDER — ACETAMINOPHEN 325 MG PO TABS
650.0000 mg | ORAL_TABLET | Freq: Four times a day (QID) | ORAL | Status: DC | PRN
  Administered 2024-11-05 – 2024-11-11 (×2): 650 mg via ORAL
  Filled 2024-11-04 (×2): qty 2

## 2024-11-04 MED ORDER — OXYCODONE HCL 5 MG PO TABS
5.0000 mg | ORAL_TABLET | Freq: Four times a day (QID) | ORAL | Status: DC | PRN
  Administered 2024-11-05 – 2024-11-06 (×2): 5 mg via ORAL
  Filled 2024-11-04 (×2): qty 1

## 2024-11-04 MED ORDER — WARFARIN SODIUM 2.5 MG PO TABS: 2.5000 mg | ORAL_TABLET | Freq: Every day | ORAL | Status: DC

## 2024-11-04 MED ORDER — SODIUM BICARBONATE 8.4 % IV SOLN
INTRAVENOUS | Status: DC
  Filled 2024-11-04 (×2): qty 1000

## 2024-11-04 MED ORDER — ADULT MULTIVITAMIN W/MINERALS CH
1.0000 | ORAL_TABLET | Freq: Every day | ORAL | Status: DC
  Administered 2024-11-04 – 2024-11-11 (×8): 1 via ORAL
  Filled 2024-11-04 (×8): qty 1

## 2024-11-04 MED ORDER — METOPROLOL SUCCINATE ER 25 MG PO TB24
25.0000 mg | ORAL_TABLET | Freq: Every day | ORAL | Status: DC
  Administered 2024-11-05: 25 mg via ORAL
  Filled 2024-11-04: qty 1

## 2024-11-04 MED ORDER — ENOXAPARIN SODIUM 80 MG/0.8ML IJ SOSY: 75.0000 mg | PREFILLED_SYRINGE | INTRAMUSCULAR | Status: DC

## 2024-11-04 MED ORDER — WARFARIN - PHARMACIST DOSING INPATIENT: Freq: Every day | Status: DC

## 2024-11-04 MED ORDER — GABAPENTIN 300 MG PO CAPS
300.0000 mg | ORAL_CAPSULE | Freq: Two times a day (BID) | ORAL | Status: DC
  Administered 2024-11-04 – 2024-11-11 (×15): 300 mg via ORAL
  Filled 2024-11-04 (×15): qty 1

## 2024-11-04 MED ORDER — TRAZODONE HCL 50 MG PO TABS: 25.0000 mg | ORAL_TABLET | Freq: Every evening | ORAL | Status: DC | PRN

## 2024-11-04 MED ORDER — ROSUVASTATIN CALCIUM 10 MG PO TABS
10.0000 mg | ORAL_TABLET | Freq: Every day | ORAL | Status: DC
  Administered 2024-11-04 – 2024-11-10 (×7): 10 mg via ORAL
  Filled 2024-11-04 (×7): qty 1

## 2024-11-04 MED ORDER — VITAMIN B-12 1000 MCG PO TABS
500.0000 ug | ORAL_TABLET | Freq: Every day | ORAL | Status: DC
  Administered 2024-11-04 – 2024-11-11 (×8): 500 ug via ORAL
  Filled 2024-11-04 (×8): qty 1

## 2024-11-04 NOTE — Assessment & Plan Note (Signed)
-   The patient will be admitted to a medical telemetry observation bed. - Will continue hydration with IV normal saline and follow BMP. - Will avoid nephrotoxins. - This is likely prerenal due to volume depletion and dehydration.

## 2024-11-04 NOTE — ED Notes (Signed)
 Spoke to pt's wife to update her regarding pt. Wife notes that he has been hallucinating, asking where are his cats are and stating that he's at home despite being at [facility]. Wife mentioned that pt's mom and dad have Alzheimer's. Wife also notes that pt tested positive for COVID on 15 Jan. Moreover, pt has fallen 6 times in the past week.

## 2024-11-04 NOTE — Hospital Course (Addendum)
 Johnathan [name] is a 69 y.o. Caucasian male with medical history significant for osteoarthritis, vitamin B12 deficiency, chronic systolic CHF, Lyme Disease, Essential Hypertension, Hypothyroidism, Peripheral Neuropathy, OSA, Falls and Paroxysmal Atrial Fibrillation, who presented to the emergency room from his nursing facility (rehabbing at [facility]) with AMS and falling  HPI: He had 2 falls 01/24 which he did not recall. Per wife, (per RN note) he has been hallucinating, asking where are his cats are and stating that he's at home despite being at [facility]. Wife mentioned that pt's mom and dad have Alzheimer's. Wife also notes that pt tested positive for COVID on 15 Jan. Moreover, pt has fallen 6 times in the past week.     Hospital course / significant events: 01/24: to ED.  BP was 113/95 with otherwise normal vital signs.  Ca 6.2 and a BUN of 38 with creatinine 1.66, CO2 14 and chloride 118 and albumin was 2.4 with AST 44 ALT of 85 and total bili of 5.5.  Lactic acid was 1.3 CBC was within normal.  INR was 1.6 and PTT 20 urinalysis was unremarkable.  EKG showed atrial fibrillation with rate of 102 with poor R wave progression and Q waves in anteriorly as well as repolarization abnormality of prolonged QT interval with QTc 568. CXR no acute cardiopulmonary disease.  Noncontrast head CT scan revealed no acute intracranial abnormalities.  It showed acute right nasal bone fracture. Given 2 L bolus of IV normal saline, a gram of IV calcium  gluconate. 01/25: Cl and CO2 have normalized, renal fxn mildly worse w/ Cr 1.89 and GFR 38, reduced metoprolol  contineu gentle fluids does not appear to be overloaded  01/26: into Afib RVR overnight, cardizem  rate improved but then had to dc this d/t low BP, cardiology consulted, anticipate restart higher dose beta blocker and if BP dropping may need to sent to ICU for pressor support. Elevated temp this AM 100.33F --> denies fever/chills, reports mild cough, no other  complaints --> flu/COVID PCR, CXR, UA, CBC diff, consider abx if temps persist  01/27: increased WBC, borderline for sepsis (tachycardia ~Afib), Cr up again --> fluids, await UCx, consider expand abx / advanced imaging pending clinical course    Consultants:  Cardiology Nephrology   Procedures/Surgeries: none      ASSESSMENT & PLAN:   CARDIOVASCULAR  Afib RVR into Afib RVR overnight 01/26 --> cardizem  rate improved but then had to dc this d/t low BP cardiology following Titration up on metoprolol  per cardiology  Maintain telemetry  if BP dropping may need to go to ICU for pressor support.   Essential HTN, Low BP here HFrEF - chronic - EF 35-40 Reduced metoprolol  (for Afib rate control) but then into Afib RVR - resume beta blocker and cardiology titrating this  Caution w/ IV fluids in CHF patient - giving additional fluids this morning w/ low BP Given low BP have held home lasix , entresto , spironolactone   Cardiology following    Subtherapeutic INR - improving  Pharmacy to dose coumadin   Dyslipidemia Will continue statin therapy.  Recurrent falls / Presyncope suspect cardiogenic cause w/ hypotension Question cause d/t dehydration / AKI / polypharmacy / CHF BP low and was on high dose metoprolol  suspect this may be the problem along w/ dehydration and diminished renal perfusion would also explain AKI PT/OT Orthostatic vitals   INFECTIOUS DISEASE  SEPSIS CANNOT BE RULED OUT 11/06/24 AM:  WBC <4 or >12 = YES Temp <96.8 or >100.4 = NO HR >90 = YES  IN PRESENCE OF AFIB RR >20 = on one measurement, not sustained and he is on RA Multiple potential infectious sources see below  Await urine and blood cultures  Treating UTI as noted, supportive care for COVID as above  Additional fluids given AKI  Low threshold for more advanced imaging but he has no respiratory or abdominal/GI complaints. Low threshold for ID consult if not improving / pending cultures   Abnormal UA  concerning for UTI No urinary symptoms  Rocephin  Await urine and blood cultures   (+)COVID question residual viral material from recent COVID diagnosed 01/14 per wife he is not complaining of significant respiratory problems other than mild nagging cough, O2 is WNL, CXR no concerns supportive care   Recent Cdiff  Recent infection, completed treatment  Monitor for loose stool - has not had any significant diarrhea here  Would not re-test, as this will almost certainly be positive given recent infection   Leukocytosis  L shift and increasing WBC concern for infectious source - likely UTI / COVID though also w/ recent CDiff infection Monitor CBC and other sepsis parameters as above   RESPIRATORY  (+)COVID question residual viral material from recent COVID diagnosed 01/14 per wife he is not complaining of significant respiratory problems other than mild nagging cough, O2 is WNL, CXR no concerns supportive care   RENAL   AKI (acute kidney injury) - improving then worse again   Cr/GFR on admission 1.66/44, baseline 0.8-1.0 and GFR >60 Cr/GFR 11/04/24 1.89 / 38 Cr/GFR 11/05/24 1.57 / 50 Cr/GFR today 11/06/24 1.88 / 38 IV NS fluids, repeat 1L today Monitor BMP  Avoid nephrotoxins  Given hyperkalemia and not improving, given high risk decompensation, will consult to nephrology   Hyperkalemia Lokelma  x2 doses today Monitor BMP  Hypocalcemia acute on chronic  Ca 6.2 on admission 01/24 --> 8.7 on 01/25 which is baseline   Monitor BMP Canhold calcium  carbonate for now, if levels drop again would resume supplementation    Metabolic acidosis resolved Can dc bicarbonate infusion. Monitor BMP  GASTROINTESTINAL  Cdiff  Recent infection, completed treatment  Monitor for loose stool - has not had any significant diarrhea here   NEUROLOGICAL  Resting tremor upper extremities bilaterally Worse today, improved w/ intentional movement No other concerning nero findings  Monitor,  would not medicate this right now and seems improved today   ENDOCRINE  Hypothyroidism Check TSH --> WNL Continue synthroid    MUSCULOSKELETAL/RHEUM  Acute right nasal bone fracture.   Supportive care   PSYCHIATRIC   Depression - holding Effexor  XR d/t long QT Continue Wellbutrin  XL.   Class 3 morbid based on BMI: Body mass index is 42.33 kg/m.[name] Significantly low or high BMI is associated with higher medical risk.  Underweight - under 18  overweight - 25 to 29 obese - 30 or more Class 1 obesity: BMI of 30.0 to 34 Class 2 obesity: BMI of 35.0 to 39 Class 3 obesity: BMI of 40.0 to 49 Super Morbid Obesity: BMI 50-59 Super-super Morbid Obesity: BMI 60+ Healthy nutrition and physical activity advised as adjunct to other disease management and risk reduction treatments    DVT prophylaxis: Coumadin  IV fluids: 1L NS bolus this morning  Nutrition: cardiac diet Central lines / other devices: none  Code Status: FULL CODE ACP documentation reviewed: has living will on file in [facility] needs: none at this time, expect will need authorization to go back to rehab  Medical barriers to dispo at this time: AKI, Afib, ?sepsis.

## 2024-11-04 NOTE — Progress Notes (Signed)
 " PROGRESS NOTE    Johnathan [name]   FMW:[id number] DOB: 10-06-1956  DOA: 11/03/2024 Date of Service: 11/04/24 which is hospital day 0  PCP: Marylynn Verneita CROME, MD    Johnathan [name] is a 69 y.o. Caucasian male with medical history significant for osteoarthritis, vitamin B12 deficiency, chronic systolic CHF, Lyme Disease, Essential Hypertension, Hypothyroidism, Peripheral Neuropathy, OSA, Falls and Paroxysmal Atrial Fibrillation, who presented to the emergency room from his nursing facility (rehabbing at [facility]) with AMS and falling  HPI: He had 2 falls 01/24 which he did not recall. Per wife, (per RN note) he has been hallucinating, asking where are his cats are and stating that he's at home despite being at [facility]. Wife mentioned that pt's mom and dad have Alzheimer's. Wife also notes that pt tested positive for COVID on 15 Jan. Moreover, pt has fallen 6 times in the past week.     Hospital course / significant events: 01/24: to ED.  BP was 113/95 with otherwise normal vital signs.  Ca 6.2 and a BUN of 38 with creatinine 1.66, CO2 14 and chloride 118 and albumin was 2.4 with AST 44 ALT of 85 and total bili of 5.5.  Lactic acid was 1.3 CBC was within normal.  INR was 1.6 and PTT 20 urinalysis was unremarkable.  EKG showed atrial fibrillation with rate of 102 with poor R wave progression and Q waves in anteriorly as well as repolarization abnormality of prolonged QT interval with QTc 568. CXR no acute cardiopulmonary disease.  Noncontrast head CT scan revealed no acute intracranial abnormalities.  It showed acute right nasal bone fracture. Given 2 L bolus of IV normal saline, a gram of IV calcium  gluconate. 01/25: Cl and CO2 have normalized, renal fxn mildly worse w/ Cr 1.89 and GFR 38, reduced metoprolol  contineu gentle fluids does not appear to be overloaded    Consultants:  none  Procedures/Surgeries: none      ASSESSMENT & PLAN:   AKI (acute kidney injury) Cr/GFR on admission  1.66/44, baseline 0.8-1.0 and GFR >60 Cr/GFR today 11/04/24 1.89 / 38 IV NS continuous fluids - slow and 1L only for now given hx HFrEF [place] BMP  Avoid nephrotoxins    Hypocalcemia acute on chronic  Ca 6.2 on admission 01/24 --> 8.7 on 01/25 whic his baseline  Monitor BMP   Metabolic acidosis resolved Can dc bicarbonate infusion. Monitor BMP   Recurrent falls Question cause d/t dehydration / AKI  BP low and was on fairly high dose metoprolol  suspect this may be the problem and less dehydration more diminished renal perfusion  PT/OT Orthostatic vitals   Acute right nasal bone fracture.   Supportive care   Chronic atrial fibrillation continue metoprolol  and Coumadin . Reduced dose metoprolol    Subtherapeutic INR Pharmacy to dose coumadin    Essential HTN  Low BP here HFrEF - chronic - EF 35-40 Reduced metoprolol  (for Afib rate control)  Caution w/ IV fluids in CHF patient  Given low BP holding lasix , entresto , spironolactone    Cdiff  Recent infection, completed treatment  Monitor for loose stool  Depression - holding Effexor  XR d/t long QT Continue Wellbutrin  XL.   Dyslipidemia Will continue statin therapy.   Hypothyroidism Check TSH --> WNL Continue synthroid       Class 3 morbid based on BMI: Body mass index is 42.33 kg/m.Johnathan [name] Significantly low or high BMI is associated with higher medical risk.  Underweight - under 18  overweight - 25 to 29 obese - 30  or more Class 1 obesity: BMI of 30.0 to 34 Class 2 obesity: BMI of 35.0 to 39 Class 3 obesity: BMI of 40.0 to 49 Super Morbid Obesity: BMI 50-59 Super-super Morbid Obesity: BMI 60+ Healthy nutrition and physical activity advised as adjunct to other disease management and risk reduction treatments    DVT prophylaxis: Coumadin  IV fluids: 1L continuous IV fluids  Nutrition: cardiac diet Central lines / other devices: none  Code Status: FULL CODE ACP documentation reviewed: has living will on file in  [facility] needs: none at this time, expect will need authorization to go back to rehab  Medical barriers to dispo at this time: AKI.         Subjective / Brief ROS:  Patient reports some better today  Denies CP/SOB.  Pain controlled.  Denies new weakness.  Tolerating diet.  Reports no concerns w/ urination/defecation.   Family Communication: call to patient's wife 11/04/24 1:15 PM all questions answered     Objective Findings:  Vitals:   11/04/24 0110 11/04/24 0436 11/04/24 0749 11/04/24 0840  BP: 102/74 (!) 81/57 94/64   Pulse: 70 (!) 59 95   Resp: 20 18 17    Temp:  (!) 97.5 F (36.4 C) (!) 97.4 F (36.3 C)   TempSrc:      SpO2: 99% 92% (!) 81% 98%  Weight:      Height:        Intake/Output Summary (Last 24 hours) at 11/04/2024 1315 Last data filed at 11/04/2024 1200 Gross per 24 hour  Intake 3607.57 ml  Output --  Net 3607.57 ml   Filed Weights   11/03/24 2105  Weight: (!) 153.6 kg    Examination:  Physical Exam Constitutional:      General: He is not in acute distress. Cardiovascular:     Rate and Rhythm: Normal rate and regular rhythm.  Pulmonary:     Effort: Pulmonary effort is normal.     Breath sounds: Normal breath sounds. No rales.  Musculoskeletal:     Right lower leg: Edema (+1) present.     Left lower leg: Edema (+1) present.  Skin:    General: Skin is warm and dry.  Neurological:     Mental Status: He is alert and oriented to person, place, and time.  Psychiatric:        Mood and Affect: Mood normal.        Behavior: Behavior normal.          Scheduled Medications:   buPROPion   300 mg Oral Daily   calcium  carbonate  1 tablet Oral TID WC   cyanocobalamin   500 mcg Oral Daily   gabapentin   300 mg Oral BID   levothyroxine   25 mcg Oral Q0600   [START ON 11/05/2024] metoprolol  succinate  25 mg Oral Daily   multivitamin with minerals  1 tablet Oral Daily   rosuvastatin   10 mg Oral q1800   vitamin E   400 Units Oral Daily    warfarin  4 mg Oral ONCE-1600   Warfarin - Pharmacist Dosing Inpatient   Does not apply q1600    Continuous Infusions:    PRN Medications:  acetaminophen  **OR** acetaminophen , oxyCODONE , traZODone   Antimicrobials from admission:  Anti-infectives (From admission, onward)    None           Data Reviewed:  I have personally reviewed the following...  CBC: Recent Labs  Lab 11/03/24 2111 11/04/24 0550  WBC 9.4 8.1  NEUTROABS 5.5  --  HGB 14.9 12.7*  HCT 45.9 39.0  MCV 90.5 90.3  PLT 180 133*   Basic Metabolic Panel: Recent Labs  Lab 11/03/24 2220 11/04/24 0550  NA 142 141  K 3.6 4.3  CL 118* 109  CO2 14* 22  GLUCOSE 92 112*  BUN 38* 46*  CREATININE 1.66* 1.89*  CALCIUM  6.2* 8.7*  MG 1.9  --    GFR: Estimated Creatinine Clearance: 58.5 mL/min (A) (by C-G formula based on SCr of 1.89 mg/dL (H)). Liver Function Tests: Recent Labs  Lab 11/03/24 2220  AST 44*  ALT 85*  ALKPHOS 80  BILITOT 0.3  PROT 4.5*  ALBUMIN 2.4*   No results for input(s): LIPASE, AMYLASE in the last 168 hours. No results for input(s): AMMONIA in the last 168 hours. Coagulation Profile: Recent Labs  Lab 11/03/24 2111 11/04/24 0550  INR 1.6* 1.7*   Cardiac Enzymes: No results for input(s): CKTOTAL, CKMB, CKMBINDEX, TROPONINI in the last 168 hours. BNP (last 3 results) No results for input(s): PROBNP in the last 8760 hours. HbA1C: No results for input(s): HGBA1C in the last 72 hours. CBG: No results for input(s): GLUCAP in the last 168 hours. Lipid Profile: No results for input(s): CHOL, HDL, LDLCALC, TRIG, CHOLHDL, LDLDIRECT in the last 72 hours. Thyroid  Function Tests: Recent Labs    11/04/24 0550  TSH 2.480   Anemia Panel: No results for input(s): VITAMINB12, FOLATE, FERRITIN, TIBC, IRON , RETICCTPCT in the last 72 hours. Most Recent Urinalysis On File:     Component Value Date/Time   COLORURINE AMBER (A) 11/03/2024  2111   APPEARANCEUR HAZY (A) 11/03/2024 2111   APPEARANCEUR Hazy 07/16/2013 0858   LABSPEC 1.021 11/03/2024 2111   LABSPEC 1.019 07/16/2013 0858   PHURINE 5.0 11/03/2024 2111   GLUCOSEU NEGATIVE 11/03/2024 2111   GLUCOSEU NEGATIVE 08/27/2015 1653   HGBUR NEGATIVE 11/03/2024 2111   BILIRUBINUR NEGATIVE 11/03/2024 2111   BILIRUBINUR negative 09/28/2018 1116   BILIRUBINUR Negative 07/16/2013 0858   KETONESUR NEGATIVE 11/03/2024 2111   PROTEINUR NEGATIVE 11/03/2024 2111   UROBILINOGEN 0.2 09/28/2018 1116   UROBILINOGEN 0.2 08/27/2015 1653   NITRITE NEGATIVE 11/03/2024 2111   LEUKOCYTESUR NEGATIVE 11/03/2024 2111   LEUKOCYTESUR Negative 07/16/2013 0858   Sepsis Labs: @LABRCNTIP (procalcitonin:4,lacticidven:4) Microbiology: Recent Results (from the past 240 hours)  MRSA Next Gen by PCR, Nasal     Status: None   Collection Time: 11/04/24  4:20 AM   Specimen: Nasal Mucosa; Nasal Swab  Result Value Ref Range Status   MRSA by PCR Next Gen NOT DETECTED NOT DETECTED Final    Comment: (NOTE) The GeneXpert MRSA Assay (FDA approved for NASAL specimens only), is one component of a comprehensive MRSA colonization surveillance program. It is not intended to diagnose MRSA infection nor to guide or monitor treatment for MRSA infections. Test performance is not FDA approved in patients less than 22 years old. Performed at [facility], [address], [place], [place] [address]       Radiology Studies last 3 days: DG Chest 1 View Result Date: 11/03/2024 EXAM: 1 VIEW(S) XRAY OF THE CHEST 11/03/2024 09:41:28 PM COMPARISON: 10/11/2024 CLINICAL HISTORY: Weakness and fall. FINDINGS: LUNGS AND PLEURA: Low lung volumes. No focal pulmonary opacity. No pleural effusion. No pneumothorax. HEART AND MEDIASTINUM: No acute abnormality of the cardiac and mediastinal silhouettes. BONES AND SOFT TISSUES: Right shoulder arthroplasty partially imaged. Right upper quadrant surgical clips. IMPRESSION: 1.  No acute findings. Electronically signed by: Morgane Naveau MD 11/03/2024 09:55 PM EST RP  Workstation: [id number]   CT Cervical Spine Wo Contrast Result Date: 11/03/2024 EXAM: CT CERVICAL SPINE WITHOUT CONTRAST 11/03/2024 09:20:40 PM TECHNIQUE: CT of the cervical spine was performed without the administration of intravenous contrast. Multiplanar reformatted images are provided for review. Automated exposure control, iterative reconstruction, and/or weight based adjustment of the mA/kV was utilized to reduce the radiation dose to as low as reasonably achievable. COMPARISON: None available. CLINICAL HISTORY: Neck trauma (Age >= 65y) Neck trauma. Age greater than or equal to 65 years. FINDINGS: BONES AND ALIGNMENT: No acute fracture or traumatic malalignment. DEGENERATIVE CHANGES: Multilevel moderate to severe degenerative changes of the spine. No associated severe osseous neural foraminal or central canal stenosis. SOFT TISSUES: No prevertebral soft tissue swelling. IMPRESSION: 1. No evidence of acute traumatic injury. Electronically signed by: Morgane Naveau MD 11/03/2024 09:35 PM EST RP Workstation: [id number]   CT Head Wo Contrast Result Date: 11/03/2024 EXAM: CT HEAD WITHOUT CONTRAST 11/03/2024 09:20:40 PM TECHNIQUE: CT of the head was performed without the administration of intravenous contrast. Automated exposure control, iterative reconstruction, and/or weight based adjustment of the mA/kV was utilized to reduce the radiation dose to as low as reasonably achievable. COMPARISON: None available. CLINICAL HISTORY: Head trauma, minor (Age >= 65y) Head trauma, minor (Age >= 65 years). FINDINGS: BRAIN AND VENTRICLES: No acute hemorrhage. No evidence of acute infarct. No hydrocephalus. No extra-axial collection. No mass effect or midline shift. ORBITS: No acute abnormality. SINUSES: No acute abnormality. SOFT TISSUES AND SKULL: No acute soft tissue abnormality. Acute right nasal bone fracture. IMPRESSION: 1.  Acute right nasal bone fracture. 2. No acute intracranial findings. Electronically signed by: Morgane Naveau MD 11/03/2024 09:25 PM EST RP Workstation: [id number]          Jasen Hartstein, DO [facility] 11/04/2024, 1:15 PM    Dictation software may have been used to generate the above note. Typos may occur and escape review in typed/dictated notes. Please contact Dr Marsa directly for clarity if needed.  Staff may message me via secure chat in Epic  but this may not receive an immediate response,  please page me for urgent matters!  If 7PM-7AM, please contact night coverage [email or web address]       "

## 2024-11-04 NOTE — Evaluation (Signed)
 Physical Therapy Evaluation Patient Details Name: Johnathan [name] MRN: [id number] DOB: [birth year] Today's Date: 11/04/2024  History of Present Illness  Pt is a 69 y.o. Caucasian male with medical history significant for osteoarthritis, vitamin B12 deficiency, chronic systolic CHF, Lyme Disease, Essential Hypertension, Hypothyroidism, Peripheral Neuropathy, OSA, Falls and Paroxysmal Atrial Fibrillation, who presented to the emergency room with acute onset of altered mental status at his SNF. MD assessment includes: AKI, hypocalcemia, metabolic acidosis, and recurrent falls.   Clinical Impression  Pt was pleasant and motivated to participate during the session and put forth good effort throughout. Pt required no physical assistance with bed mobility or transfers but did require the EOB to be elevated, cues for sequencing, and multiple rocking attempts to come to standing.  Once in standing pt able to maintain static standing balance with close CGA assist only but with marching/amb pt required min A for stability and presented with noted knee instability/buckling with L knee > R.  Pt is at a very high risk for falls and will benefit from continued PT services upon discharge to safely address deficits listed in patient problem list for decreased caregiver assistance and eventual return to PLOF.          If plan is discharge home, recommend the following: Assistance with cooking/housework;Assist for transportation;Help with stairs or ramp for entrance;Two people to help with walking and/or transfers;A little help with bathing/dressing/bathroom   Can travel by private vehicle   No    Equipment Recommendations Other (comment) (TBD at next venue of care)  Recommendations for Other Services       Functional Status Assessment Patient has had a recent decline in their functional status and demonstrates the ability to make significant improvements in function in a reasonable and predictable amount of  time.     Precautions / Restrictions Precautions Precautions: Fall Recall of Precautions/Restrictions: Intact Restrictions Weight Bearing Restrictions Per Provider Order: No      Mobility  Bed Mobility Overal bed mobility: Modified Independent Bed Mobility: Supine to Sit, Sit to Supine     Supine to sit: Modified independent (Device/Increase time) Sit to supine: Modified independent (Device/Increase time)   General bed mobility comments: Min extra time, effort, and use of bed rails only    Transfers Overall transfer level: Needs assistance Equipment used: Rolling walker (2 wheels) Transfers: Sit to/from Stand Sit to Stand: Contact guard assist, From elevated surface           General transfer comment: Extra time, effort, and surface height elevated required along with multiple rocking attempts to come to standing with cues for increased trunk flexion    Ambulation/Gait Ambulation/Gait assistance: Min assist Gait Distance (Feet): 6 Feet Assistive device: Rolling walker (2 wheels) Gait Pattern/deviations: Step-to pattern, Decreased step length - right, Decreased step length - left, Trunk flexed Gait velocity: decreased     General Gait Details: Pt able to take several small steps laterally left/right at the EOB with min A for stability and with noted knee buckling on each LE during stance phase with LLE > RLE but pt able to self correct.  Stairs            Wheelchair Mobility     Tilt Bed    Modified Rankin (Stroke Patients Only)       Balance Overall balance assessment: Needs assistance Sitting-balance support: Feet supported Sitting balance-Leahy Scale: Good     Standing balance support: Bilateral upper extremity supported, During functional activity, Reliant on assistive device  for balance Standing balance-Leahy Scale: Poor                               Pertinent Vitals/Pain Pain Assessment Pain Assessment: No/denies pain     Home Living Family/patient expects to be discharged to:: Skilled nursing facility Living Arrangements: Spouse/significant other Available Help at Discharge: Family;Available 24 hours/day Type of Home: House Home Access: Ramped entrance       Home Layout: One level Home Equipment: Rollator (4 wheels);Shower seat;Cane - single Librarian, Academic (2 wheels);Adaptive equipment;Hand held shower head Additional Comments: Pt admitted from [facility], there for STR    Prior Function Prior Level of Function : History of Falls (last six months)             Mobility Comments: Prior to recent functional decline and admission to [facility] for STR pt was Mod Ind with amb with a rollator mostly household distances; pt reports extensive fall history, 12+, in the last 6 months due to BLE buckling ADLs Comments: Assist for ADLs in the last two months, Ind prior to that     Extremity/Trunk Assessment   Upper Extremity Assessment Upper Extremity Assessment: Generalized weakness    Lower Extremity Assessment Lower Extremity Assessment: Generalized weakness       Communication   Communication Communication: No apparent difficulties    Cognition Arousal: Alert Behavior During Therapy: WFL for tasks assessed/performed   PT - Cognitive impairments: No apparent impairments                         Following commands: Intact       Cueing Cueing Techniques: Verbal cues     General Comments      Exercises Total Joint Exercises Long Arc Quad: AROM, Strengthening, Both, 10 reps Knee Flexion: AROM, Strengthening, Both, 10 reps Marching in Standing: AROM, Strengthening, Both, 5 reps, Standing   Assessment/Plan    PT Assessment Patient needs continued PT services  PT Problem List Decreased balance;Decreased mobility;Decreased activity tolerance;Decreased knowledge of use of DME;Decreased strength       PT Treatment Interventions DME instruction;Balance training;Gait  training;Neuromuscular re-education;Patient/family education;Functional mobility training;Therapeutic activities;Therapeutic exercise    PT Goals (Current goals can be found in the Care Plan section)  Acute Rehab PT Goals Patient Stated Goal: To get stronger PT Goal Formulation: With patient Time For Goal Achievement: 11/17/24 Potential to Achieve Goals: Good    Frequency Min 2X/week     Co-evaluation               AM-PAC PT 6 Clicks Mobility  Outcome Measure Help needed turning from your back to your side while in a flat bed without using bedrails?: A Little Help needed moving from lying on your back to sitting on the side of a flat bed without using bedrails?: A Little Help needed moving to and from a bed to a chair (including a wheelchair)?: A Lot Help needed standing up from a chair using your arms (e.g., wheelchair or bedside chair)?: A Little Help needed to walk in hospital room?: Total Help needed climbing 3-5 steps with a railing? : Total 6 Click Score: 13    End of Session Equipment Utilized During Treatment: Gait belt Activity Tolerance: Patient tolerated treatment well Patient left: in bed;with call bell/phone within reach;with bed alarm set Nurse Communication: Mobility status PT Visit Diagnosis: Difficulty in walking, not elsewhere classified (R26.2);Muscle weakness (generalized) (M62.81)  Time: 9040-8977 PT Time Calculation (min) (ACUTE ONLY): 23 min   Charges:   PT Evaluation $PT Eval Moderate Complexity: 1 Mod   PT General Charges $$ ACUTE PT VISIT: 1 Visit    D. Glendia Bertin PT, DPT 11/04/24, 12:19 PM

## 2024-11-04 NOTE — Consult Note (Signed)
 PHARMACY - ANTICOAGULATION CONSULT NOTE  Pharmacy Consult for Warfarin Indication: atrial fibrillation  Allergies[1]  Patient Measurements: Height: 6' 3 (190.5 cm) Weight: (!) 153.6 kg (338 lb 10 oz) IBW/kg (Calculated) : 84.5 HEPARIN DW (KG): 120  Vital Signs: Temp: 97.4 F (36.3 C) (01/25 0749) Temp Source: Oral (01/25 0030) BP: 94/64 (01/25 0749) Pulse Rate: 95 (01/25 0749)  Labs: Recent Labs    11/03/24 2111 11/03/24 2220 11/04/24 0550  HGB 14.9  --  12.7*  HCT 45.9  --  39.0  PLT 180  --  133*  LABPROT 20.0*  --  20.7*  INR 1.6*  --  1.7*  CREATININE  --  1.66* 1.89*    Estimated Creatinine Clearance: 58.5 mL/min (A) (by C-G formula based on SCr of 1.89 mg/dL (H)).   Medical History: Past Medical History:  Diagnosis Date   Arthritis    a. chronic joint pain   B12 deficiency    Chronic systolic CHF (congestive heart failure) (HCC)    a. echo 2013: EF of 50-55%, normal right ventricular systolic pressure, normal left atrium; b. EF 40-45%, inadequate for LV wall motion, mild MR, LA severely dilated @ 52 mm, PASP nl   Complication of anesthesia    Dysrhythmia    Erythema migrans (Lyme disease) 04/08/2015   History of DVT (deep vein thrombosis) 2004   s/p bariatric surgery   History of stress test    a. there was no ST segment deviation noted during stress,    There is a small defect of moderate severity present in the apex location, suggestive of apical ischemia, intermediate risk, calculated EF 21% but visually appears to be 35-40%   Hypertension    Hypothyroidism    Neuropathy    Obesity    OSA (obstructive sleep apnea)    a. not compliant with CPAP   PAF (paroxysmal atrial fibrillation) (HCC)    a. CHADSVASc at least 2 (HTN and vascular disease); b. on Eliquis     Pre-diabetes     Medications:  Warfarin 2.5 mg daily prior to admission with last dose 11/03/24 at 0900. This was a new discharge dose on 10/16/24. TWD = 17.5 mg  Regimen prior to 10/11/24 -  10/16/24 admission was 7.5 mg on MoFr and 5 mg EOD = TWD 40 mg  Assessment: 69 y/o M with medical history as above and including Afib and Hx DVT on warfarin. Admitted with AKI. Pharmacy to dose warfarin.  Goal of Therapy:  INR 2-3   Plan:  --INR is subtherapeutic --Give warfarin 4 mg tonight --Daily INR while admitted --Patient may need regimen adjusted given recently TWD was reduced > 50% and now patient is subtherapeutic  Marolyn KATHEE Mare 11/04/2024,8:47 AM    [1]  Allergies Allergen Reactions   Eliquis  [Apixaban ] Itching    Red spots and severe itching   Xarelto [Rivaroxaban] Hives    Reaction in 2013

## 2024-11-04 NOTE — Assessment & Plan Note (Signed)
-   PT evaluation will be obtained.

## 2024-11-04 NOTE — Assessment & Plan Note (Signed)
-   Will continue Entresto , Aldactone  and Toprol -XL.

## 2024-11-04 NOTE — Assessment & Plan Note (Signed)
-   Will continue amiodarone  and Coumadin .

## 2024-11-04 NOTE — Assessment & Plan Note (Signed)
-   Continue Effexor  XR and Wellbutrin  XL.

## 2024-11-04 NOTE — Assessment & Plan Note (Signed)
-   The patient will be placed on IV bicarbonate infusion. - Will follow BMP.

## 2024-11-04 NOTE — Assessment & Plan Note (Addendum)
-   Will continue Synthroid .

## 2024-11-04 NOTE — H&P (Addendum)
 "     [place]   PATIENT NAME: Johnathan [name]    MR#:  [id number]  DATE OF BIRTH:  [birth year]  DATE OF ADMISSION:  11/03/2024  PRIMARY CARE PHYSICIAN: Marylynn Verneita CROME, MD   Patient is coming from: [facility]  REQUESTING/REFERRING PHYSICIAN: Ward, Josette SAILOR, DO  CHIEF COMPLAINT:   Chief Complaint  Patient presents with   Fall    HISTORY OF PRESENT ILLNESS:  GID SCHOFFSTALL is a 69 y.o. Caucasian male with medical history significant for osteoarthritis, vitamin B12 deficiency, chronic systolic CHF, Lyme Disease, Essential Hypertension, Hypothyroidism, Peripheral Neuropathy, OSA, Falls and Paroxysmal Atrial Fibrillation, who presented to the emergency room with acute onset of altered mental status at his SNF.  He had 2 falls today which he did not recall.  No head injuries.  No chest pain or palpitations.  No cough or wheezing or dyspnea.  He admitted to diminished appetite.  No nausea or vomiting or diarrhea or abdominal pain.  No fever or chills.  No dysuria, oliguria, hematuria, urinary frequency or urgency or flank pain.  ED Course: Upon arrival to the ER BP was 113/95 with otherwise normal vital signs.  Labs revealed calcium  of 6.2 and a BUN of 38 with creatinine 1.66, CO2 14 and chloride 118 and albumin was 2.4 with AST 44 ALT of 85 and total bili of 5.5.  Lactic acid was 1 point repeat CBC was within normal.  INR was 1.6 and PTT 20 urinalysis was unremarkable.   EKG as reviewed by me :EKG showed atrial fibrillation with mildly RVR of 102 with poor R wave progression and Q waves in anteriorly as well as repolarization abnormality of prolonged QT interval with QTc 568 MS. Imaging: Portable chest x-ray showed no acute cardiopulmonary disease.  Noncontrast head CT scan revealed no acute intracranial abnormalities.  It showed acute right nasal bone fracture.  The patient was given 2 L bolus of IV normal saline, a gram of IV calcium  gluconate and IV normal saline 125 mL/h.   He will be admitted to a telemetry observation bed for further evaluation and management. PAST MEDICAL HISTORY:   Past Medical History:  Diagnosis Date   Arthritis    a. chronic joint pain   B12 deficiency    Chronic systolic CHF (congestive heart failure) (HCC)    a. echo 2013: EF of 50-55%, normal right ventricular systolic pressure, normal left atrium; b. EF 40-45%, inadequate for LV wall motion, mild MR, LA severely dilated @ 52 mm, PASP nl   Complication of anesthesia    Dysrhythmia    Erythema migrans (Lyme disease) 04/08/2015   History of DVT (deep vein thrombosis) 2004   s/p bariatric surgery   History of stress test    a. there was no ST segment deviation noted during stress,    There is a small defect of moderate severity present in the apex location, suggestive of apical ischemia, intermediate risk, calculated EF 21% but visually appears to be 35-40%   Hypertension    Hypothyroidism    Neuropathy    Obesity    OSA (obstructive sleep apnea)    a. not compliant with CPAP   PAF (paroxysmal atrial fibrillation) (HCC)    a. CHADSVASc at least 2 (HTN and vascular disease); b. on Eliquis     Pre-diabetes     PAST SURGICAL HISTORY:   Past Surgical History:  Procedure Laterality Date   ARTHROSCOPIC REPAIR ACL Bilateral 1993   bilateral knee x2  on left and x2 on right   BARIATRIC SURGERY  2004   Rou en Y    BARIATRIC SURGERY     CHOLECYSTECTOMY  1982   ELECTROPHYSIOLOGIC STUDY N/A 06/20/2015   Procedure: CARDIOVERSION;  Surgeon: Evalene JINNY Lunger, MD;  Location: [facility] ORS;  Service: Cardiovascular;  Laterality: N/A;   GANGLION CYST EXCISION     KNEE ARTHROPLASTY Right 06/20/2017   Procedure: COMPUTER ASSISTED TOTAL KNEE ARTHROPLASTY;  Surgeon: Mardee Lynwood SQUIBB, MD;  Location: [facility] ORS;  Service: Orthopedics;  Laterality: Right;   MENISCUS DEBRIDEMENT Left 2011   Left knee   REPLACEMENT TOTAL KNEE Left 07/2013   REVERSE SHOULDER ARTHROPLASTY Right 12/30/2022   Procedure:  REVERSE SHOULDER ARTHROPLASTY;  Surgeon: Dozier Soulier, MD;  Location: [facility] ORS;  Service: Orthopedics;  Laterality: Right;   TONSILLECTOMY      SOCIAL HISTORY:   Social History   Tobacco Use   Smoking status: Never    Passive exposure: Never   Smokeless tobacco: Never  Substance Use Topics   Alcohol use: Not Currently    Alcohol/week: 2.0 standard drinks of alcohol    Types: 2 Shots of liquor per week    Comment: Vodka and lemonade occasionally 2-3/week     FAMILY HISTORY:   Family History  Problem Relation Age of Onset   Dementia Mother    Diabetes Father    Alzheimer's disease Father     DRUG ALLERGIES:  Allergies[1]  REVIEW OF SYSTEMS:   ROS As per history of present illness. All pertinent systems were reviewed above. Constitutional, HEENT, cardiovascular, respiratory, GI, GU, musculoskeletal, neuro, psychiatric, endocrine, integumentary and hematologic systems were reviewed and are otherwise negative/unremarkable except for positive findings mentioned above in the HPI.   MEDICATIONS AT HOME:   Prior to Admission medications  Medication Sig Start Date End Date Taking? Authorizing Provider  acetaminophen  (TYLENOL ) 325 MG tablet Take 2 tablets (650 mg total) by mouth every 6 (six) hours as needed for mild pain (pain score 1-3) or fever (or Fever >/= 101). 10/16/24   Josette Ade, MD  buPROPion  (WELLBUTRIN  XL) 300 MG 24 hr tablet Take 1 tablet (300 mg total) by mouth daily. 12/26/23   Marylynn Verneita CROME, MD  furosemide  (LASIX ) 20 MG tablet Take 1 tablet (20 mg total) by mouth daily. 10/17/24   Josette Ade, MD  gabapentin  (NEURONTIN ) 300 MG capsule Take 1 capsule (300 mg total) by mouth 2 (two) times daily. TAKE 1 CAPSULE BY MOUTH TWO TIMES A DAY 10/16/24   Josette Ade, MD  levothyroxine  (SYNTHROID ) 25 MCG tablet Take 1 tablet (25 mcg total) by mouth daily before breakfast. 10/09/24   Marylynn Verneita CROME, MD  metoprolol  succinate (TOPROL -XL) 100 MG 24 hr tablet Take 1  tablet (100 mg total) by mouth 2 (two) times daily. Take with or immediately following a meal. 10/16/24   Josette Ade, MD  Multiple Vitamin (MULTIVITAMIN) tablet Take 1 tablet by mouth daily.    [provider]  oxyCODONE  (OXY IR/ROXICODONE ) 5 MG immediate release tablet Take 1 tablet (5 mg total) by mouth every 6 (six) hours as needed for severe pain (pain score 7-10). 10/16/24   Josette Ade, MD  rosuvastatin  (CRESTOR ) 10 MG tablet TAKE 1 TABLET BY MOUTH ONCE DAILY AT  2PM 10/09/24   Marylynn Verneita CROME, MD  sacubitril -valsartan  (ENTRESTO ) 24-26 MG Take 1 tablet by mouth 2 (two) times daily. 10/16/24   Josette Ade, MD  spironolactone  (ALDACTONE ) 25 MG tablet Take 1 tablet (25 mg  total) by mouth daily. 10/17/24   Josette Ade, MD  venlafaxine  XR (EFFEXOR -XR) 150 MG 24 hr capsule Take 2 capsules (300 mg total) by mouth daily with breakfast. 10/09/24   Marylynn Verneita CROME, MD  vitamin B-12 (CYANOCOBALAMIN ) 500 MCG tablet Take 500 mcg by mouth daily.    [provider]  vitamin E  400 UNIT capsule Take 400 Units by mouth daily.    [provider]  warfarin (COUMADIN ) 2.5 MG tablet Take 1 tablet (2.5 mg total) by mouth daily. 10/18/24   Josette Ade, MD      VITAL SIGNS:  Blood pressure 102/74, pulse 70, temperature 97.7 F (36.5 C), temperature source Oral, resp. rate 20, height 6' 3 (1.905 m), weight (!) 153.6 kg, SpO2 99%.  PHYSICAL EXAMINATION:  Physical Exam  GENERAL:  69 y.o.-year-old Caucasian male patient lying in the bed with no acute distress.  EYES: Pupils equal, round, reactive to light and accommodation. No scleral icterus. Extraocular muscles intact.  HEENT: Head atraumatic, normocephalic. Oropharynx and nasopharynx clear.  NECK:  Supple, no jugular venous distention. No thyroid  enlargement, no tenderness.  LUNGS: Normal breath sounds bilaterally, no wheezing, rales,rhonchi or crepitation. No use of accessory muscles of respiration.  CARDIOVASCULAR:  Regular rate and rhythm, S1, S2 normal. No murmurs, rubs, or gallops.  ABDOMEN: Soft, nondistended, nontender. Bowel sounds present. No organomegaly or mass.  EXTREMITIES: No pedal edema, cyanosis, or clubbing.  NEUROLOGIC: Cranial nerves II through XII are intact. Muscle strength 5/5 in all extremities. Sensation intact. Gait not checked.  PSYCHIATRIC: The patient is alert and oriented x 3.  Normal affect and good eye contact. SKIN: No obvious rash, lesion, or ulcer.   LABORATORY PANEL:   CBC Recent Labs  Lab 11/03/24 2111  WBC 9.4  HGB 14.9  HCT 45.9  PLT 180   ------------------------------------------------------------------------------------------------------------------  Chemistries  Recent Labs  Lab 11/03/24 2220  NA 142  K 3.6  CL 118*  CO2 14*  GLUCOSE 92  BUN 38*  CREATININE 1.66*  CALCIUM  6.2*  MG 1.9  AST 44*  ALT 85*  ALKPHOS 80  BILITOT 0.3   ------------------------------------------------------------------------------------------------------------------  Cardiac Enzymes No results for input(s): TROPONINI in the last 168 hours. ------------------------------------------------------------------------------------------------------------------  RADIOLOGY:  DG Chest 1 View Result Date: 11/03/2024 EXAM: 1 VIEW(S) XRAY OF THE CHEST 11/03/2024 09:41:28 PM COMPARISON: 10/11/2024 CLINICAL HISTORY: Weakness and fall. FINDINGS: LUNGS AND PLEURA: Low lung volumes. No focal pulmonary opacity. No pleural effusion. No pneumothorax. HEART AND MEDIASTINUM: No acute abnormality of the cardiac and mediastinal silhouettes. BONES AND SOFT TISSUES: Right shoulder arthroplasty partially imaged. Right upper quadrant surgical clips. IMPRESSION: 1. No acute findings. Electronically signed by: Morgane Naveau MD 11/03/2024 09:55 PM EST RP Workstation: [id number]   CT Cervical Spine Wo Contrast Result Date: 11/03/2024 EXAM: CT CERVICAL SPINE WITHOUT CONTRAST 11/03/2024 09:20:40  PM TECHNIQUE: CT of the cervical spine was performed without the administration of intravenous contrast. Multiplanar reformatted images are provided for review. Automated exposure control, iterative reconstruction, and/or weight based adjustment of the mA/kV was utilized to reduce the radiation dose to as low as reasonably achievable. COMPARISON: None available. CLINICAL HISTORY: Neck trauma (Age >= 65y) Neck trauma. Age greater than or equal to 65 years. FINDINGS: BONES AND ALIGNMENT: No acute fracture or traumatic malalignment. DEGENERATIVE CHANGES: Multilevel moderate to severe degenerative changes of the spine. No associated severe osseous neural foraminal or central canal stenosis. SOFT TISSUES: No prevertebral soft tissue swelling. IMPRESSION: 1. No evidence of acute traumatic injury. Electronically  signed by: Morgane Naveau MD 11/03/2024 09:35 PM EST RP Workstation: [id number]   CT Head Wo Contrast Result Date: 11/03/2024 EXAM: CT HEAD WITHOUT CONTRAST 11/03/2024 09:20:40 PM TECHNIQUE: CT of the head was performed without the administration of intravenous contrast. Automated exposure control, iterative reconstruction, and/or weight based adjustment of the mA/kV was utilized to reduce the radiation dose to as low as reasonably achievable. COMPARISON: None available. CLINICAL HISTORY: Head trauma, minor (Age >= 65y) Head trauma, minor (Age >= 65 years). FINDINGS: BRAIN AND VENTRICLES: No acute hemorrhage. No evidence of acute infarct. No hydrocephalus. No extra-axial collection. No mass effect or midline shift. ORBITS: No acute abnormality. SINUSES: No acute abnormality. SOFT TISSUES AND SKULL: No acute soft tissue abnormality. Acute right nasal bone fracture. IMPRESSION: 1. Acute right nasal bone fracture. 2. No acute intracranial findings. Electronically signed by: Morgane Naveau MD 11/03/2024 09:25 PM EST RP Workstation: [id number]      IMPRESSION AND PLAN:  Assessment and Plan: * AKI (acute kidney  injury) - The patient will be admitted to a medical telemetry observation bed. - Will continue hydration with IV normal saline and follow BMP. - Will avoid nephrotoxins. - This is likely prerenal due to volume depletion and dehydration.  Hypocalcemia - Calcium  will be replaced and followed.  Metabolic acidosis - The patient will be placed on IV bicarbonate infusion. - Will follow BMP.  Recurrent falls - PT evaluation will be obtained  Chronic atrial fibrillation with RVR (HCC) - Will continue amiodarone  and Coumadin .  Depression - Continue Effexor  XR and Wellbutrin  XL.  Dyslipidemia Will continue statin therapy.  Hypothyroidism - Will continue Synthroid .  Chronic systolic CHF (congestive heart failure) (HCC) - Will continue Entresto , Aldactone  and Toprol -XL.   DVT prophylaxis: Lovenox . Advanced Care Planning:  Code Status: full code. Family Communication:  The plan of care was discussed in details with the patient (and family). I answered all questions. The patient agreed to proceed with the above mentioned plan. Further management will depend upon hospital course. Disposition Plan: Back to previous home environment Consults called: none. All the records are reviewed and case discussed with ED provider.  Status is: Observation  I certify that at the time of admission, it is my clinical judgment that the patient will require hospital care extending Le than 2 midnights.                            Dispo: The patient is from: [facility]              Anticipated d/c is to: [facility]              Patient currently is not medically stable to d/c.              Difficult to place patient: No  Madison DELENA Peaches M.D on 11/04/2024 at 1:34 AM  [facility]   From 7 PM-7 AM, contact night-coverage [email or web address]  CC: Primary care physician; Marylynn Verneita CROME, MD     [1]  Allergies Allergen Reactions   Eliquis  [Apixaban ] Itching    Red spots and severe  itching   Xarelto [Rivaroxaban] Hives    Reaction in 2013   "

## 2024-11-04 NOTE — Assessment & Plan Note (Signed)
 Will continue statin therapy

## 2024-11-04 NOTE — Plan of Care (Signed)

## 2024-11-04 NOTE — Assessment & Plan Note (Signed)
 Calcium  will be replaced and  followed.

## 2024-11-05 ENCOUNTER — Encounter: Payer: Self-pay | Admitting: Family Medicine

## 2024-11-05 DIAGNOSIS — E785 Hyperlipidemia, unspecified: Secondary | ICD-10-CM

## 2024-11-05 DIAGNOSIS — N179 Acute kidney failure, unspecified: Secondary | ICD-10-CM | POA: Diagnosis not present

## 2024-11-05 DIAGNOSIS — I4819 Other persistent atrial fibrillation: Secondary | ICD-10-CM | POA: Diagnosis not present

## 2024-11-05 DIAGNOSIS — I5022 Chronic systolic (congestive) heart failure: Secondary | ICD-10-CM

## 2024-11-05 LAB — CBC WITH DIFFERENTIAL/PLATELET
Abs Immature Granulocytes: 0.07 10*3/uL (ref 0.00–0.07)
Basophils Absolute: 0 10*3/uL (ref 0.0–0.1)
Basophils Relative: 0 %
Eosinophils Absolute: 0 10*3/uL (ref 0.0–0.5)
Eosinophils Relative: 0 %
HCT: 40.3 % (ref 39.0–52.0)
Hemoglobin: 13.5 g/dL (ref 13.0–17.0)
Immature Granulocytes: 1 %
Lymphocytes Relative: 2 %
Lymphs Abs: 0.2 10*3/uL — ABNORMAL LOW (ref 0.7–4.0)
MCH: 30.1 pg (ref 26.0–34.0)
MCHC: 33.5 g/dL (ref 30.0–36.0)
MCV: 89.8 fL (ref 80.0–100.0)
Monocytes Absolute: 0.1 10*3/uL (ref 0.1–1.0)
Monocytes Relative: 1 %
Neutro Abs: 13.3 10*3/uL — ABNORMAL HIGH (ref 1.7–7.7)
Neutrophils Relative %: 96 %
Platelets: 120 10*3/uL — ABNORMAL LOW (ref 150–400)
RBC: 4.49 MIL/uL (ref 4.22–5.81)
RDW: 15.4 % (ref 11.5–15.5)
WBC: 13.7 10*3/uL — ABNORMAL HIGH (ref 4.0–10.5)
nRBC: 0 % (ref 0.0–0.2)

## 2024-11-05 LAB — BASIC METABOLIC PANEL WITH GFR
Anion gap: 18 — ABNORMAL HIGH (ref 5–15)
Anion gap: 11 (ref 5–15)
BUN: 40 mg/dL — ABNORMAL HIGH (ref 8–23)
BUN: 39 mg/dL — ABNORMAL HIGH (ref 8–23)
CO2: 15 mmol/L — ABNORMAL LOW (ref 22–32)
CO2: 20 mmol/L — ABNORMAL LOW (ref 22–32)
Calcium: 8.6 mg/dL — ABNORMAL LOW (ref 8.9–10.3)
Calcium: 8.8 mg/dL — ABNORMAL LOW (ref 8.9–10.3)
Chloride: 106 mmol/L (ref 98–111)
Chloride: 107 mmol/L (ref 98–111)
Creatinine, Ser: 1.51 mg/dL — ABNORMAL HIGH (ref 0.61–1.24)
Creatinine, Ser: 1.57 mg/dL — ABNORMAL HIGH (ref 0.61–1.24)
GFR, Estimated: 50 mL/min — ABNORMAL LOW
GFR, Estimated: 47 mL/min — ABNORMAL LOW
Glucose, Bld: 79 mg/dL (ref 70–99)
Glucose, Bld: 82 mg/dL (ref 70–99)
Potassium: 4.4 mmol/L (ref 3.5–5.1)
Potassium: 4.3 mmol/L (ref 3.5–5.1)
Sodium: 138 mmol/L (ref 135–145)
Sodium: 139 mmol/L (ref 135–145)

## 2024-11-05 LAB — URINALYSIS, COMPLETE (UACMP) WITH MICROSCOPIC
Bilirubin Urine: NEGATIVE
Glucose, UA: NEGATIVE mg/dL
Ketones, ur: NEGATIVE mg/dL
Nitrite: NEGATIVE
Protein, ur: 30 mg/dL — AB
Specific Gravity, Urine: 1.021 (ref 1.005–1.030)
WBC, UA: >50 WBC/hpf (ref 0–5)
pH: 5 (ref 5.0–8.0)

## 2024-11-05 LAB — CULTURE, BLOOD (ROUTINE X 2)

## 2024-11-05 LAB — TROPONIN T, HIGH SENSITIVITY
Troponin T High Sensitivity: 50 ng/L — ABNORMAL HIGH (ref 0–19)
Troponin T High Sensitivity: 50 ng/L — ABNORMAL HIGH (ref 0–19)

## 2024-11-05 LAB — GLUCOSE, CAPILLARY
Glucose-Capillary: 52 mg/dL — ABNORMAL LOW (ref 70–99)
Glucose-Capillary: 67 mg/dL — ABNORMAL LOW (ref 70–99)
Glucose-Capillary: 78 mg/dL (ref 70–99)

## 2024-11-05 LAB — PROTIME-INR
INR: 2.1 — ABNORMAL HIGH (ref 0.8–1.2)
Prothrombin Time: 24.4 s — ABNORMAL HIGH (ref 11.4–15.2)

## 2024-11-05 LAB — RESP PANEL BY RT-PCR (RSV, FLU A&B, COVID)  RVPGX2
Influenza A by PCR: NEGATIVE
Influenza B by PCR: NEGATIVE
Resp Syncytial Virus by PCR: NEGATIVE
SARS Coronavirus 2 by RT PCR: POSITIVE — AB

## 2024-11-05 MED ORDER — AMIODARONE LOAD VIA INFUSION
150.0000 mg | Freq: Once | INTRAVENOUS | Status: AC
  Administered 2024-11-05: 150 mg via INTRAVENOUS
  Filled 2024-11-05: qty 83.34

## 2024-11-05 MED ORDER — DIGOXIN 0.25 MG/ML IJ SOLN
0.2500 mg | Freq: Once | INTRAMUSCULAR | Status: AC
  Administered 2024-11-05: 0.25 mg via INTRAVENOUS
  Filled 2024-11-05: qty 2

## 2024-11-05 MED ORDER — METOPROLOL TARTRATE 25 MG PO TABS
25.0000 mg | ORAL_TABLET | Freq: Four times a day (QID) | ORAL | Status: DC
  Administered 2024-11-05 – 2024-11-08 (×7): 25 mg via ORAL
  Filled 2024-11-05 (×8): qty 1

## 2024-11-05 MED ORDER — DILTIAZEM LOAD VIA INFUSION
10.0000 mg | Freq: Once | INTRAVENOUS | Status: AC
  Administered 2024-11-05: 10 mg via INTRAVENOUS
  Filled 2024-11-05: qty 10

## 2024-11-05 MED ORDER — SODIUM CHLORIDE 0.9 % IV BOLUS: 1000.0000 mL | Freq: Once | INTRAVENOUS | Status: DC

## 2024-11-05 MED ORDER — SODIUM CHLORIDE 0.9 % IV SOLN
1.0000 g | INTRAVENOUS | Status: DC
  Administered 2024-11-05: 1 g via INTRAVENOUS
  Filled 2024-11-05: qty 10

## 2024-11-05 MED ORDER — MIDODRINE HCL 5 MG PO TABS
5.0000 mg | ORAL_TABLET | Freq: Once | ORAL | Status: AC
  Administered 2024-11-05: 5 mg via ORAL
  Filled 2024-11-05: qty 1

## 2024-11-05 MED ORDER — SODIUM CHLORIDE 0.9 % IV BOLUS
250.0000 mL | Freq: Once | INTRAVENOUS | Status: AC
  Administered 2024-11-05: 250 mL via INTRAVENOUS

## 2024-11-05 MED ORDER — AMIODARONE HCL IN DEXTROSE 360-4.14 MG/200ML-% IV SOLN
60.0000 mg/h | INTRAVENOUS | Status: DC
  Administered 2024-11-05: 60 mg/h via INTRAVENOUS
  Filled 2024-11-05: qty 200

## 2024-11-05 MED ORDER — AMIODARONE HCL IN DEXTROSE 360-4.14 MG/200ML-% IV SOLN: 30.0000 mg/h | INTRAVENOUS | Status: DC

## 2024-11-05 MED ORDER — WARFARIN SODIUM 2.5 MG PO TABS
2.5000 mg | ORAL_TABLET | Freq: Once | ORAL | Status: AC
  Administered 2024-11-05: 2.5 mg via ORAL
  Filled 2024-11-05: qty 1

## 2024-11-05 MED ORDER — DILTIAZEM HCL-DEXTROSE 125-5 MG/125ML-% IV SOLN (PREMIX)
5.0000 mg/h | INTRAVENOUS | Status: DC
  Administered 2024-11-05: 5 mg/h via INTRAVENOUS
  Filled 2024-11-05: qty 125

## 2024-11-05 NOTE — Progress Notes (Signed)
 OT Cancellation Note  Patient Details Name: Johnathan [name] MRN: [id number] DOB: 03/30/1956   Cancelled Treatment:    Reason Eval/Treat Not Completed: Medical issues which prohibited therapy. Patient with fever, hypotension, and tachycardia this AM, transferred to progressive unit. Per protocol, OT will hold at this time until medically appropriate.   Elston Slot, M.S. OTR/L  11/05/24, 9:37 AM  ascom [phone]

## 2024-11-05 NOTE — Consult Note (Signed)
 PHARMACY - ANTICOAGULATION CONSULT NOTE  Pharmacy Consult for Warfarin Indication: atrial fibrillation  Allergies[1]  Patient Measurements: Height: 6' 3 (190.5 cm) Weight: (!) 153.6 kg (338 lb 10 oz) IBW/kg (Calculated) : 84.5 HEPARIN DW (KG): 120  Labs: Recent Labs    11/03/24 2111 11/03/24 2220 11/04/24 0550 11/05/24 0630  HGB 14.9  --  12.7*  --   HCT 45.9  --  39.0  --   PLT 180  --  133*  --   LABPROT 20.0*  --  20.7* 24.4*  INR 1.6*  --  1.7* 2.1*  CREATININE  --  1.66* 1.89*  --     Estimated Creatinine Clearance: 58.5 mL/min (A) (by C-G formula based on SCr of 1.89 mg/dL (H)).   Medical History: Past Medical History:  Diagnosis Date   Arthritis    a. chronic joint pain   B12 deficiency    Chronic systolic CHF (congestive heart failure) (HCC)    a. echo 2013: EF of 50-55%, normal right ventricular systolic pressure, normal left atrium; b. EF 40-45%, inadequate for LV wall motion, mild MR, LA severely dilated @ 52 mm, PASP nl   Complication of anesthesia    Dysrhythmia    Erythema migrans (Lyme disease) 04/08/2015   History of DVT (deep vein thrombosis) 2004   s/p bariatric surgery   History of stress test    a. there was no ST segment deviation noted during stress,    There is a small defect of moderate severity present in the apex location, suggestive of apical ischemia, intermediate risk, calculated EF 21% but visually appears to be 35-40%   Hypertension    Hypothyroidism    Neuropathy    Obesity    OSA (obstructive sleep apnea)    a. not compliant with CPAP   PAF (paroxysmal atrial fibrillation) (HCC)    a. CHADSVASc at least 2 (HTN and vascular disease); b. on Eliquis     Pre-diabetes    Medications:  Warfarin 2.5 mg daily prior to admission with last dose 11/03/24 at 0900. This was a new discharge dose on 10/16/24. TWD = 17.5 mg  Regimen prior to 10/11/24 - 10/16/24 admission was 7.5 mg on MoFr and 5 mg EOD = TWD 40 mg  Assessment: 69 y/o M with  medical history as above and including Afib and Hx DVT on warfarin. Admitted with AKI. Pharmacy to dose warfarin.  Goal of Therapy:  INR 2-3   Plan:  --INR is therapeutic --Give warfarin 2.5 mg tonight (home dose) --Daily INR while admitted --Patient may need regimen adjusted given TWD was recently reduced > 50% and patient subtherapeutic on admission  Marolyn KATHEE Mare 11/05/2024,7:14 AM    [1]  Allergies Allergen Reactions   Eliquis  [Apixaban ] Itching    Red spots and severe itching   Xarelto [Rivaroxaban] Hives    Reaction in 2013

## 2024-11-05 NOTE — Progress Notes (Signed)
 PT Cancellation Note  Patient Details Name: Johnathan [name] MRN: [id number] DOB: 01-31-1956   Cancelled Treatment:    Reason Eval/Treat Not Completed: Medical issues which prohibited therapy Patient with hypotension and tachycardia this AM, transferred to progressive unit. PT will hold at this time until medically appropriate.   Johnathan [name], PT, DPT Physical Therapist - [facility]  [facility]   Johnathan [name] 11/05/2024, 8:41 AM

## 2024-11-05 NOTE — TOC Initial Note (Signed)
 Transition of Care [facility]) - Initial/Assessment Note    Patient Details  Name: Johnathan [name] MRN: [id number] Date of Birth: [birth year]  [facility] CM/SW Contact:    Shasta DELENA Daring, RN Phone Number: 11/05/2024, 2:51 PM  Clinical Narrative:                 RNCM contacted patient wife/HCPOA. She said patient will NOT be returning to [facility]. She plans to bring him home and use home health services.    Confirmed Dr. Marylynn is PCP. Patient lives in single family home with his wife. Uses [facility] on [address] for pharmacy. No problems affording medications. DME in home includes walker, wheelchair and shower chair. NO HH services at this time.   Advised we will initiate a search for service and be in touch with offers.  HCPOA verbalized understanding.   Expected Discharge Plan: Home w Home Health Services Barriers to Discharge: Continued Medical Work up   Patient Goals and CMS Choice            Expected Discharge Plan and Services   Discharge Planning Services: CM Consult                                          Prior Living Arrangements/Services   Lives with:: Spouse Patient language and need for interpreter reviewed:: Yes Do you feel safe going back to the place where you live?: Yes      Need for Family Participation in Patient Care: Yes (Comment) Care giver support system in place?: Yes (comment)   Criminal Activity/Legal Involvement Pertinent to Current Situation/Hospitalization: No - Comment as needed  Activities of Daily Living   ADL Screening (condition at time of admission) Independently performs ADLs?: No Does the patient have a NEW difficulty with bathing/dressing/toileting/self-feeding that is expected to last >3 days?: Yes (Initiates electronic notice to provider for possible OT consult) Does the patient have a NEW difficulty with getting in/out of bed, walking, or climbing stairs that is expected to last >3 days?: Yes  (Initiates electronic notice to provider for possible PT consult) Does the patient have a NEW difficulty with communication that is expected to last >3 days?: No Is the patient deaf or have difficulty hearing?: No Does the patient have difficulty seeing, even when wearing glasses/contacts?: No Does the patient have difficulty concentrating, remembering, or making decisions?: Yes  Permission Sought/Granted   Permission granted to share information with : Yes, Verbal Permission Granted              Emotional Assessment       Orientation: : Oriented to Self Alcohol / Substance Use: Not Applicable Psych Involvement: No (comment)  Admission diagnosis:  Hypocalcemia [E83.51] Metabolic acidosis [E87.20] Prolonged Q-T interval on ECG [R94.31] AKI (acute kidney injury) [N17.9] Closed fracture of nasal bone, initial encounter [S02.2XXA] Fall, initial encounter [id number]] Patient Active Problem List   Diagnosis Date Noted   Hypocalcemia 11/04/2024   Recurrent falls 11/04/2024   Metabolic acidosis 11/04/2024   Chronic atrial fibrillation with RVR (HCC) 11/04/2024   Chronic systolic CHF (congestive heart failure) (HCC) 11/04/2024   Hypothyroidism 11/04/2024   Dyslipidemia 11/04/2024   Depression 11/04/2024   AKI (acute kidney injury) 11/03/2024   Supratherapeutic INR 10/16/2024   Chronic heart failure with reduced ejection fraction (HFrEF, <= 40%) (HCC) 10/14/2024   Atrial fibrillation with RVR (HCC)  10/14/2024   Dilated cardiomyopathy (HCC) 10/14/2024   C. difficile colitis 10/12/2024   Cellulitis of right lower extremity 10/11/2024   Hospital discharge follow-up 10/10/2024   Cellulitis of right lower leg 10/10/2024   Trichomonas infection 10/10/2024   Right wrist tendinitis 02/16/2023   Chronic pain disorder 01/24/2023   Arthritis of right wrist 11/02/2022   Arrhythmia 09/16/2022   Rotator cuff arthropathy, right 04/27/2022   Cervical spondylitis with radiculitis 03/21/2022    Aspermia 03/21/2022   Erectile dysfunction 03/21/2022   Prediabetes 12/15/2021   Lumbar radiculopathy 04/03/2021   Acquired thrombophilia 10/02/2020   Encounter for preventive health examination 05/31/2020   Frequent falls 07/01/2019   Urinary frequency 12/28/2017   Major depressive disorder with current active episode 09/26/2017   S/P total knee arthroplasty 06/20/2017   Preoperative evaluation to rule out surgical contraindication 08/02/2016   Chronic venous insufficiency 06/23/2016   Acquired hypothyroidism 04/04/2016   Long-term use of high-risk medication 03/22/2016   Hyperlipidemia 03/22/2016   Pain in joint, ankle and foot 12/04/2015   CHF (congestive heart failure) (HCC) 05/21/2015   Bilateral leg edema 05/21/2015   OSA (obstructive sleep apnea)    History of DVT (deep vein thrombosis)    B12 deficiency    S/P left unicompartmental knee replacement 02/05/2015   Routine general medical examination at a health care facility 11/08/2014   Obesity, Class III, BMI 40-49.9 (morbid obesity) (HCC) 08/03/2014   Neck pain 05/13/2014   Rotator cuff insufficiency of left shoulder 05/13/2014   Weakness 05/06/2014   Essential hypertension 10/17/2013   Polyarthralgia 07/04/2013   Neuropathy 07/04/2013   S/P bariatric surgery 07/04/2013   Degenerative joint disease involving multiple joints on both sides of body 07/10/2012   Chronic atrial fibrillation with rapid ventricular response (HCC) 06/19/2012   PCP:  Marylynn Verneita CROME, MD Pharmacy:   [facility] [address], [place] - [address] [place] [address] Phone: [phone] Fax: [phone]  [id number] [place], [place] - [address] [place] [address] Phone: [phone] Fax: [phone]     Social Drivers of Health (SDOH) Social History: SDOH Screenings   Food Insecurity: No Food Insecurity (11/04/2024)  Housing: Low Risk (11/04/2024)  Transportation Needs: No  Transportation Needs (11/04/2024)  Utilities: Not At Risk (11/04/2024)  Alcohol Screen: Low Risk (09/04/2024)  Depression (PHQ2-9): Medium Risk (10/09/2024)  Financial Resource Strain: Low Risk (09/04/2024)  Physical Activity: Insufficiently Active (09/04/2024)  Social Connections: Moderately Integrated (11/04/2024)  Recent Concern: Social Connections - Moderately Isolated (10/11/2024)  Stress: No Stress Concern Present (09/04/2024)  Tobacco Use: Low Risk (11/03/2024)  Health Literacy: Adequate Health Literacy (09/04/2024)   SDOH Interventions:     Readmission Risk Interventions    11/05/2024    2:45 PM  Readmission Risk Prevention Plan  Transportation Screening Complete  PCP or Specialist Appt within 5-7 Days --  Home Care Screening Complete  Medication Review (RN CM) Complete

## 2024-11-05 NOTE — Plan of Care (Signed)
  Problem: Education: Goal: Knowledge of General Education information will improve Description: Including pain rating scale, medication(s)/side effects and non-pharmacologic comfort measures Outcome: Progressing   Problem: Health Behavior/Discharge Planning: Goal: Ability to manage health-related needs will improve Outcome: Progressing   Problem: Clinical Measurements: Goal: Ability to maintain clinical measurements within normal limits will improve Outcome: Progressing Goal: Will remain free from infection Outcome: Progressing Goal: Diagnostic test results will improve Outcome: Progressing Goal: Respiratory complications will improve Outcome: Progressing Goal: Cardiovascular complication will be avoided Outcome: Progressing   Problem: Activity: Goal: Risk for activity intolerance will decrease Outcome: Progressing   Problem: Nutrition: Goal: Adequate nutrition will be maintained Outcome: Progressing   Problem: Coping: Goal: Level of anxiety will decrease Outcome: Progressing   Problem: Elimination: Goal: Will not experience complications related to bowel motility Outcome: Progressing Goal: Will not experience complications related to urinary retention Outcome: Progressing   Problem: Pain Managment: Goal: General experience of comfort will improve and/or be controlled Outcome: Progressing   Problem: Safety: Goal: Ability to remain free from injury will improve Outcome: Progressing   Problem: Skin Integrity: Goal: Risk for impaired skin integrity will decrease Outcome: Progressing   Problem: Education: Goal: Knowledge of risk factors and measures for prevention of condition will improve Outcome: Progressing   Problem: Coping: Goal: Psychosocial and spiritual needs will be supported Outcome: Progressing   Problem: Respiratory: Goal: Will maintain a patent airway Outcome: Progressing Goal: Complications related to the disease process, condition or treatment  will be avoided or minimized Outcome: Progressing

## 2024-11-05 NOTE — Consult Note (Addendum)
 "  Cardiology Consultation   Patient ID: JEFFREY GRAEFE MRN: [id number]; DOB: 1956-09-27  Admit date: 11/03/2024 Date of Consult: 11/05/2024  PCP:  Marylynn Verneita CROME, MD   [facility] Providers Cardiologist:  Evalene Lunger, MD        Patient Profile: Johnathan [name] is a 69 y.o. male with a hx of persistent atrial fibrillation (Dx 2013, persistent since 04/2023), morbid obesity, hypertension, history of DVT (right lower extremity, 2004), OSA not on CPAP, chronic joint pain, chronic HFmrEF/dilated cardiomyopathy, hypothyroidism, chronic venous insufficiency, who is being seen 11/05/2024 for the evaluation of atrial fibrillation with RVR at the request of Dr. Marsa.  History of Present Illness:   Mr. Reihl has previously been diagnosed with atrial fibrillation in August 2013 with an echocardiogram revealing normal ejection fraction.  Prior cardioversion was successful in restoring him to sinus rhythm.  Unfortunately had a tick bite in 03/2015 and developed atrial fibrillation initially with RVR.  He was then started on dig and metoprolol  as well as diltiazem .  Echocardiogram and stress were done at that time.  His EF was estimated 40-45% and stress testing showed a small region of apical perfusion defect.  At some point converted back to sinus and was maintained on oral amiodarone  and warfarin (couldn't afford pradaxa ).  He was evaluated by Dr. Gollan in July 2024 and was noted to be back in afib.  Plan was 4 weeks of therapeutic INR and  repeat cardioversion, however, pt was lost to follow-up but remained in rate-controlled afib at 10/2023 office follow-up, and cardioversion was not pursued.  He had presented to [facility] emergency department 10/11/2024 with complaint of weakness.  Had recently been admitted to [facility] for cellulitis at that time was placed on Zosyn  secondary to suspected sepsis, seemed that cellulitis was improving however with attempting to change antibiotics cellulitis is  started progressing again.  He was evaluated by dermatology.  He was able to be discharged home on 12/25 and placed on Augmentin for 10 days.  Initially Augmentin had provided some improvement but resulted in severe diarrhea and poor p.o. intake w/ associated increased weakness and falls prompting 10/11/2024 admission.  Echocardiogram completed revealed an LVEF of 35-40%, moderately decreased function, global hypokinesis, the left ventricular internal cavity size was moderately to severely dilated, mild concentric left ventricular hypertrophy, left ventricular diastolic parameters were indeterminate, left atrial size was mildly dilated, mild mitral valve regurgitation was noted as well as aortic valve sclerosis without any evidence of aortic stenosis.  Antibiotics were changed and he was added to p.o. vancomycin  due to having stool that was positive for C. difficile.  Metoprolol  was increased to 100 mg twice daily for better rate control and b/c he remained in afib x 18 mos, amio was d/c'd.  He was sent to rehab 10/16/2024 for ongoing physical therapy and continued on antibiotic therapy.  Upon discharge his INR was 3.8 and he was continued on warfarin therapy.  It was that rhythm mgmt could be readdressed as an outpt but as he has had prolonged, persistent afib, that maintenance of sinus rhythm in the future would be challenging.   He presented to the [facility] emergency department 11/03/2024 after a fall due to altered mental status from [facility].  He had had 2 falls earlier in the day.  Denies any recollection of the falls.  When asked what happens he reported he was driving his daughter to [place].  Denied any other associated symptoms.  Wife also noted that  he had tested positive for COVID on January 15 and has fallen approximately 6 times in the past week.  Initial vital signs were blood pressure 113/95, pulse of 98, respirations of 17, temperature of 98.1.  Labs revealed a calcium  of 6.2, BUN at 38 with a serum  creatinine 1.66, CO2 14, chloride 118, albumin was 2.4, AST 44, ALT 85, total bilirubin 5.5, lactic acid was 1, CBC was normal, INR was 1.6, urinalysis was unremarkable.  EKG revealed atrial fibrillation with a rate of 102.  Portable chest x-ray showed no acute cardiopulmonary disease, noncontrast head CT revealed no acute intracranial abnormalities.  He was treated with 2 L of IV normal saline, a gram of IV calcium  gluconate, and IV normal saline 125 mL/h.  He was admitted to telemetry for observation and management.  Overnight heart rate increased to the 140s, blood pressure was soft, he remained asymptomatic in no acute distress per documentation.  EKG revealed atrial fibrillation with RVR @ 147, w/ RBBB.  He was given digoxin , diltiazem  bolus and drip, transferred to progressive care, high-sensitivity troponins were ordered and cardiology was consulted.  Of note high-sensitivity troponin was 50  50.  Temp 100.5 this AM.  Dilt gtt d/c due to hypotensive earlier this AM.  Currently in afib in the 120's.  Cardiology was consulted to assist with ongoing management of atrial fibrillation.  Pt notes that he noted elevated heart rates overnight but has no complaints this AM.  Confused - thinks he's in a hallway.  I spoke to wife to provide update re: room move.   Past Medical History:  Diagnosis Date   Arthritis    a. chronic joint pain   B12 deficiency    Chronic systolic CHF (congestive heart failure) (HCC)    a. 2013 Echo: EF of 50-55%, normal right ventricular systolic pressure, normal left atrium; b. 04/2015 Echo: EF 40-45%, inadequate for LV wall motion, mild MR, LA severely dilated @ 52 mm, PASP nl; c. 10/2024 Echo: EF 35-40%, glob HK, mod-sev dil LV. Mild conc LVH. Nl RV fxn. Mildly dil LA. Mild MR. AoV sclerosis w/o stenosis.   Complication of anesthesia    Dysrhythmia    Erythema migrans (Lyme disease) 04/08/2015   History of DVT (deep vein thrombosis) 2004   s/p bariatric surgery   History of  stress test    a. there was no ST segment deviation noted during stress,    There is a small defect of moderate severity present in the apex location, suggestive of apical ischemia, intermediate risk, calculated EF 21% but visually appears to be 35-40%   Hypertension    Hypothyroidism    Neuropathy    Obesity    OSA (obstructive sleep apnea)    a. not compliant with CPAP   PAF (paroxysmal atrial fibrillation) (HCC)    a. CHADSVASc at least 2 (HTN and vascular disease); b. on Eliquis     Pre-diabetes     Past Surgical History:  Procedure Laterality Date   ARTHROSCOPIC REPAIR ACL Bilateral 1993   bilateral knee x2 on left and x2 on right   BARIATRIC SURGERY  2004   Rou en Y    BARIATRIC SURGERY     CHOLECYSTECTOMY  1982   ELECTROPHYSIOLOGIC STUDY N/A 06/20/2015   Procedure: CARDIOVERSION;  Surgeon: Evalene JINNY Lunger, MD;  Location: [facility] ORS;  Service: Cardiovascular;  Laterality: N/A;   GANGLION CYST EXCISION     KNEE ARTHROPLASTY Right 06/20/2017   Procedure: COMPUTER ASSISTED TOTAL KNEE  ARTHROPLASTY;  Surgeon: Mardee Lynwood SQUIBB, MD;  Location: [facility] ORS;  Service: Orthopedics;  Laterality: Right;   MENISCUS DEBRIDEMENT Left 2011   Left knee   REPLACEMENT TOTAL KNEE Left 07/2013   REVERSE SHOULDER ARTHROPLASTY Right 12/30/2022   Procedure: REVERSE SHOULDER ARTHROPLASTY;  Surgeon: Dozier Soulier, MD;  Location: [facility] ORS;  Service: Orthopedics;  Laterality: Right;   TONSILLECTOMY       Home Medications:  Prior to Admission medications  Medication Sig Start Date End Date Taking? Authorizing Provider  acetaminophen  (TYLENOL ) 325 MG tablet Take 2 tablets (650 mg total) by mouth every 6 (six) hours as needed for mild pain (pain score 1-3) or fever (or Fever >/= 101). 10/16/24  Yes Wieting, Richard, MD  buPROPion  (WELLBUTRIN  XL) 300 MG 24 hr tablet Take 1 tablet (300 mg total) by mouth daily. 12/26/23  Yes [name], Teresa L, MD  cetirizine (ZYRTEC) 10 MG tablet Take 10 mg by mouth daily.   Yes  [provider]  furosemide  (LASIX ) 20 MG tablet Take 1 tablet (20 mg total) by mouth daily. 10/17/24  Yes Wieting, Richard, MD  gabapentin  (NEURONTIN ) 300 MG capsule Take 1 capsule (300 mg total) by mouth 2 (two) times daily. TAKE 1 CAPSULE BY MOUTH TWO TIMES A DAY 10/16/24  Yes Wieting, Richard, MD  guaiFENesin (MUCINEX) 600 MG 12 hr tablet Take 600 mg by mouth every 12 (twelve) hours. 10/25/24 11/08/24 Yes [provider]  levothyroxine  (SYNTHROID ) 25 MCG tablet Take 1 tablet (25 mcg total) by mouth daily before breakfast. 10/09/24  Yes Marylynn Verneita CROME, MD  metoprolol  succinate (TOPROL -XL) 100 MG 24 hr tablet Take 1 tablet (100 mg total) by mouth 2 (two) times daily. Take with or immediately following a meal. 10/16/24  Yes Wieting, Richard, MD  Multiple Vitamin (MULTIVITAMIN) tablet Take 1 tablet by mouth daily.   Yes [provider]  naloxone (NARCAN) nasal spray 4 mg/0.1 mL Place 1 spray into the nose 3 (three) times daily as needed.   Yes [provider]  oxyCODONE  (OXY IR/ROXICODONE ) 5 MG immediate release tablet Take 1 tablet (5 mg total) by mouth every 6 (six) hours as needed for severe pain (pain score 7-10). 10/16/24  Yes Wieting, Richard, MD  rosuvastatin  (CRESTOR ) 10 MG tablet TAKE 1 TABLET BY MOUTH ONCE DAILY AT  2PM Patient taking differently: Take 10 mg by mouth daily. 10/09/24  Yes Marylynn Verneita CROME, MD  sacubitril -valsartan  (ENTRESTO ) 24-26 MG Take 1 tablet by mouth 2 (two) times daily. 10/16/24  Yes Wieting, Richard, MD  spironolactone  (ALDACTONE ) 25 MG tablet Take 1 tablet (25 mg total) by mouth daily. 10/17/24  Yes Wieting, Richard, MD  vancomycin  (VANCOCIN ) 125 MG capsule Take 125 mg by mouth 4 (four) times daily. 10/16/24 11/05/24 Yes [provider]  venlafaxine  XR (EFFEXOR -XR) 150 MG 24 hr capsule Take 2 capsules (300 mg total) by mouth daily with breakfast. 10/09/24  Yes Marylynn Verneita CROME, MD  vitamin B-12 (CYANOCOBALAMIN ) 500 MCG tablet Take 500 mcg  by mouth daily.   Yes [provider]  vitamin E  400 UNIT capsule Take 400 Units by mouth daily.   Yes [provider]  warfarin (COUMADIN ) 2.5 MG tablet Take 1 tablet (2.5 mg total) by mouth daily. 10/18/24  Yes Josette Ade, MD    Scheduled Meds:  buPROPion   300 mg Oral Daily   calcium  carbonate  1 tablet Oral TID WC   cyanocobalamin   500 mcg Oral Daily   gabapentin   300 mg Oral  BID   levothyroxine   25 mcg Oral Q0600   metoprolol  succinate  25 mg Oral Daily   multivitamin with minerals  1 tablet Oral Daily   rosuvastatin   10 mg Oral q1800   vitamin E   400 Units Oral Daily   warfarin  2.5 mg Oral ONCE-1600   Warfarin - Pharmacist Dosing Inpatient   Does not apply q1600   Continuous Infusions:  amiodarone  60 mg/hr (11/05/24 0842)   Followed by   amiodarone      sodium chloride      PRN Meds: acetaminophen  **OR** acetaminophen , oxyCODONE , traZODone   Allergies:   Allergies[1]  Social History:   Social History   Socioeconomic History   Marital status: Married    Spouse name: Not on file   Number of children: Not on file   Years of education: Not on file   Highest education level: Not on file  Occupational History   Not on file  Tobacco Use   Smoking status: Never    Passive exposure: Never   Smokeless tobacco: Never  Vaping Use   Vaping status: Never Used  Substance and Sexual Activity   Alcohol use: Not Currently    Alcohol/week: 2.0 standard drinks of alcohol    Types: 2 Shots of liquor per week    Comment: Vodka and lemonade occasionally 2-3/week    Drug use: Yes    Comment: gummies delta A   Sexual activity: Yes    Partners: Female    Comment: Wife  Other Topics Concern   Not on file  Social History Narrative   Currently between jobs   Lives with Wife   1 Son (33) and 1 daughter (35)    2 grandchildren by daughter    1 dog and 1 cat    Enjoys gardening, movies, dining out.       Are you right handed or left handed? Right Handed     Are you currently employed ? No   What is your current occupation? Retired    Do you live at home alone? No    Who lives with you? Wife    What type of home do you live in: 1 story or 2 story? One story home       Social Drivers of Health   Tobacco Use: Low Risk (11/03/2024)   Patient History    Smoking Tobacco Use: Never    Smokeless Tobacco Use: Never    Passive Exposure: Never  Financial Resource Strain: Low Risk (09/04/2024)   Overall Financial Resource Strain (CARDIA)    Difficulty of Paying Living Expenses: Not hard at all  Food Insecurity: No Food Insecurity (11/04/2024)   Epic    Worried About Programme Researcher, Broadcasting/film/video in the Last Year: Never true    Ran Out of Food in the Last Year: Never true  Transportation Needs: No Transportation Needs (11/04/2024)   Epic    Lack of Transportation (Medical): No    Lack of Transportation (Non-Medical): No  Physical Activity: Insufficiently Active (09/04/2024)   Exercise Vital Sign    Days of Exercise per Week: 3 days    Minutes of Exercise per Session: 40 min  Stress: No Stress Concern Present (09/04/2024)   [employer] of Occupational Health - Occupational Stress Questionnaire    Feeling of Stress: Only a little  Social Connections: Moderately Integrated (11/04/2024)   Social Connection and Isolation Panel    Frequency of Communication with Friends and Family: More than three times a week  Frequency of Social Gatherings with Friends and Family: More than three times a week    Attends Religious Services: Never    Database Administrator or Organizations: Yes    Attends Engineer, Structural: More than 4 times per year    Marital Status: Married  Recent Concern: Social Connections - Moderately Isolated (10/11/2024)   Social Connection and Isolation Panel    Frequency of Communication with Friends and Family: Never    Frequency of Social Gatherings with Friends and Family: Never    Attends Religious Services: Never    Automotive Engineer or Organizations: Yes    Attends Engineer, Structural: More than 4 times per year    Marital Status: Married  Catering Manager Violence: Not At Risk (11/04/2024)   Epic    Fear of Current or Ex-Partner: No    Emotionally Abused: No    Physically Abused: No    Sexually Abused: No  Depression (PHQ2-9): Medium Risk (10/09/2024)   Depression (PHQ2-9)    PHQ-2 Score: 7  Alcohol Screen: Low Risk (09/04/2024)   Alcohol Screen    Last Alcohol Screening Score (AUDIT): 0  Housing: Low Risk (11/04/2024)   Epic    Unable to Pay for Housing in the Last Year: No    Number of Times Moved in the Last Year: 1    Homeless in the Last Year: No  Utilities: Not At Risk (11/04/2024)   Epic    Threatened with loss of utilities: No  Health Literacy: Adequate Health Literacy (09/04/2024)   B1300 Health Literacy    Frequency of need for help with medical instructions: Never    Family History:    Family History  Problem Relation Age of Onset   Dementia Mother    Diabetes Father    Alzheimer's disease Father      ROS:  Please see the history of present illness.  Review of Systems  Constitutional:  Positive for malaise/fatigue.  Musculoskeletal:  Positive for falls.  Neurological:  Positive for weakness.    All other ROS reviewed and negative.     Physical Exam/Data: Vitals:   11/05/24 0854 11/05/24 0900 11/05/24 1100 11/05/24 1134  BP: (!) 133/100 (!) 119/94 90/60   Pulse: (!) 118 (!) 120 (!) 105 (!) 120  Resp:      Temp:  97.9 F (36.6 C) 97.6 F (36.4 C) (!) 97.3 F (36.3 C)  TempSrc:  Oral Axillary Oral  SpO2:    94%  Weight:      Height:        Intake/Output Summary (Last 24 hours) at 11/05/2024 1138 Last data filed at 11/05/2024 0035 Gross per 24 hour  Intake 371.66 ml  Output 1050 ml  Net -678.34 ml      11/03/2024    9:05 PM 10/15/2024    5:20 AM 10/12/2024    5:01 AM  Last 3 Weights  Weight (lbs) 338 lb 10 oz 339 lb 1.1 oz 339 lb 11.7 oz  Weight  (kg) 153.6 kg 153.8 kg 154.1 kg     Body mass index is 42.33 kg/m.     EKG:  The EKG was personally reviewed and demonstrates: Atrial fibrillation with a rate of 102 with right bundle branch block on admission ECG 11/05/2024 @ 03:47: Afib, 147, RBBB, personally reviewed Telemetry:  afib up to 140's, currently 120's  Exam General: Pleasant, NAD Psych: Normal affect. Neuro: Disoriented to time/place.  Moves all extremities spontaneously. HEENT: Normal  Neck: Supple without bruits or JVD. Lungs:  Resp regular and unlabored, CTA. Heart: Ir, Ir, tachy, distant, no s3, s4, or murmurs. Abdomen: Soft, non-tender, non-distended, BS + x 4.  Extremities: No clubbing, cyanosis.  Trace bilat ankle edema. DP/PT2+, Radials 2+ and equal bilaterally.  2d echo 10/13/2024 1. Left ventricular ejection fraction, by estimation, is 35 to 40%. The  left ventricle has moderately decreased function. The left ventricle  demonstrates global hypokinesis. The left ventricular internal cavity size  was moderately to severely dilated.  There is mild concentric left ventricular hypertrophy. Left ventricular  diastolic parameters are indeterminate.   2. Right ventricular systolic function is normal. The right ventricular  size is normal.   3. Left atrial size was mildly dilated.   4. The mitral valve is normal in structure. Mild mitral valve  regurgitation. No evidence of mitral stenosis.   5. The aortic valve was not well visualized. Aortic valve regurgitation  is not visualized. Aortic valve sclerosis/calcification is present,  without any evidence of aortic stenosis.   Event Monitor (Zio) 10/18/2022 Normal sinus rhythm Patient had a min HR of 44 bpm, max HR of 113 bpm, and avg HR of 57 bpm.  First Degree AV Block was present.    Isolated SVEs were rare (<1.0%), SVE Triplets were rare (<1.0%), and no SVE Couplets were present.   Isolated VEs were rare (<1.0%, 1673), VE Couplets were rare (<1.0%, 4), and VE  Triplets were rare (<1.0%, 2).  Ventricular Bigeminy was present. Inverted QRS complexes possibly due to inverted placement of device.   Patient triggered events (13) associated with normal sinus rhythm   Lexiscan  Myoview 04/19/2015 There was no ST segment deviation noted during stress. Defect 1: There is a small defect of moderate severity present in the apex location. This is suggestive of apical ischemia. Findings consistent with ischemia. This is an intermediate risk study. The left ventricular ejection fraction is moderately decreased (30-44%). Calculated EF was 21% but visually appears to be 35-40%.   Laboratory Data: High Sensitivity Troponin:  No results for input(s): TROPONINIHS in the last 720 hours.  Recent Labs  Lab 11/05/24 0630 11/05/24 0836  TRNPT 50* 50*      Chemistry Recent Labs  Lab 11/03/24 2220 11/04/24 0550 11/05/24 0629 11/05/24 0630  NA 142 141 138 139  K 3.6 4.3 4.4 4.3  CL 118* 109 106 107  CO2 14* 22 15* 20*  GLUCOSE 92 112* 79 82  BUN 38* 46* 40* 39*  CREATININE 1.66* 1.89* 1.51* 1.57*  CALCIUM  6.2* 8.7* 8.6* 8.8*  MG 1.9  --   --   --   GFRNONAA 44* 38* 50* 47*  ANIONGAP 9 11 18* 11    Recent Labs  Lab 11/03/24 2220  PROT 4.5*  ALBUMIN 2.4*  AST 44*  ALT 85*  ALKPHOS 80  BILITOT 0.3   Hematology Recent Labs  Lab 11/03/24 2111 11/04/24 0550 11/05/24 0629  WBC 9.4 8.1 13.7*  RBC 5.07 4.32 4.49  HGB 14.9 12.7* 13.5  HCT 45.9 39.0 40.3  MCV 90.5 90.3 89.8  MCH 29.4 29.4 30.1  MCHC 32.5 32.6 33.5  RDW 15.0 15.1 15.4  PLT 180 133* 120*   Thyroid   Recent Labs  Lab 11/04/24 0550  TSH 2.480    Radiology/Studies:  DG Chest 1 View Result Date: 11/03/2024 EXAM: 1 VIEW(S) XRAY OF THE CHEST 11/03/2024 09:41:28 PM COMPARISON: 10/11/2024 CLINICAL HISTORY: Weakness and fall. FINDINGS: LUNGS AND PLEURA: Low lung volumes. No  focal pulmonary opacity. No pleural effusion. No pneumothorax. HEART AND MEDIASTINUM: No acute abnormality of  the cardiac and mediastinal silhouettes. BONES AND SOFT TISSUES: Right shoulder arthroplasty partially imaged. Right upper quadrant surgical clips. IMPRESSION: 1. No acute findings. Electronically signed by: Morgane Naveau MD 11/03/2024 09:55 PM EST RP Workstation: [id number]   CT Cervical Spine Wo Contrast Result Date: 11/03/2024 EXAM: CT CERVICAL SPINE WITHOUT CONTRAST 11/03/2024 09:20:40 PM TECHNIQUE: CT of the cervical spine was performed without the administration of intravenous contrast. Multiplanar reformatted images are provided for review. Automated exposure control, iterative reconstruction, and/or weight based adjustment of the mA/kV was utilized to reduce the radiation dose to as low as reasonably achievable. COMPARISON: None available. CLINICAL HISTORY: Neck trauma (Age >= 65y) Neck trauma. Age greater than or equal to 65 years. FINDINGS: BONES AND ALIGNMENT: No acute fracture or traumatic malalignment. DEGENERATIVE CHANGES: Multilevel moderate to severe degenerative changes of the spine. No associated severe osseous neural foraminal or central canal stenosis. SOFT TISSUES: No prevertebral soft tissue swelling. IMPRESSION: 1. No evidence of acute traumatic injury. Electronically signed by: Morgane Naveau MD 11/03/2024 09:35 PM EST RP Workstation: [id number]   CT Head Wo Contrast Result Date: 11/03/2024 EXAM: CT HEAD WITHOUT CONTRAST 11/03/2024 09:20:40 PM TECHNIQUE: CT of the head was performed without the administration of intravenous contrast. Automated exposure control, iterative reconstruction, and/or weight based adjustment of the mA/kV was utilized to reduce the radiation dose to as low as reasonably achievable. COMPARISON: None available. CLINICAL HISTORY: Head trauma, minor (Age >= 65y) Head trauma, minor (Age >= 65 years). FINDINGS: BRAIN AND VENTRICLES: No acute hemorrhage. No evidence of acute infarct. No hydrocephalus. No extra-axial collection. No mass effect or midline shift. ORBITS:  No acute abnormality. SINUSES: No acute abnormality. SOFT TISSUES AND SKULL: No acute soft tissue abnormality. Acute right nasal bone fracture. IMPRESSION: 1. Acute right nasal bone fracture. 2. No acute intracranial findings. Electronically signed by: Morgane Naveau MD 11/03/2024 09:25 PM EST RP Workstation: [id number]     Assessment and Plan: Chronic persistent atrial fibrillation with RVR - Initially diagnosed with atrial fibrillation in 2013, converted x 1, presented back to the hospital in 2016 from a tick bite was found to be back in atrial fibrillation. Subsequently placed on amio and was maintaining sinus rhythm until 04/2023 - found to be in rate controlled afib @ outpt office f/u. -Periodically lost to f/u resulting in inability to arrange cardioversion -Due to prolonged persistent afib, amio d/c early 10/2024 during admission for cellulitis and C diff - has been rate controlled w/ ? blocker and OAC w/ warfarin. -In context of above, admitted w/ falls, wkns, AMS, and developed rapid afib overnight w/ rates to 140's.  BP dropped w/ IV dilt.  Amio ordered by primary team but he pulled out IV. -Low grade fever this AM. -Currently asymptomatic in afib in the 1-teens to 120's. -Was taking toprol  xl 100 bid @ home.  Will transition to tartrate 25 q6h as BP tolerates for the time being. -With subRx INR on admission and likely destination of rate-controlled permanent afib, would prefer to avoid amio. -Limit digoxin  exposure w/ AKI. -Cont warfarin w/ daily INRs.  HFrEF/Presumed NICM -H/o LV dysfxn dating back to 2016.  Low risk MV at that time. -EF down further earlier this month in setting of ongoing afib - 35-40%, glob HK, sev dil LV. -Body habitus makes exam somewhat challenging but w/ exception of trace ankle edema (w/ recent cellulitis), he appears euvolemic. -Cont ? blocker  w/ adjustment above, for now. -Home doses of entresto /spiro on hold due to soft BPs. -Had a UTI at rehab - wife says  he was incontinent and sitting in his urine for long periods of time  Poor candidate for sglt2i. -Consider ischemic testing in outpt setting pending establishment of appropriate f/u.  3.  AKI -Creat as high as 1.89 on 1/25.  1.57 this AM. -Entresto  on hold. -Follow  4.  Dyslipidemia -Cont statin.  -Mild lft abnormalities -follow  5.  Hypothyroidism -TSH nl.  6.  Fever/AMS -ID w/u per IM.  Risk Assessment/Risk Scores:         CHA2DS2-VASc Score = 3   This indicates a 3.2% annual risk of stroke. The patient's score is based upon: CHF History: 1 HTN History: 1 Diabetes History: 0 Stroke History: 0 Vascular Disease History: 0 Age Score: 1 Gender Score: 0        For questions or updates, please contact  [facility] Please consult [email or web address] for contact info under      Signed, Lonni Meager, NP  11/05/2024 11:38 AM     [1]  Allergies Allergen Reactions   Eliquis  [Apixaban ] Itching    Red spots and severe itching   Xarelto [Rivaroxaban] Hives    Reaction in 2013   "

## 2024-11-05 NOTE — Progress Notes (Signed)
 "       Overnight   NAME: Johnathan [name] MRN: [id number] DOB : 1956/02/12    Date of Service   11/05/2024   HPI/Events of Note   HPI: 69 y.o. Caucasian male with medical history significant for osteoarthritis, vitamin B12 deficiency, chronic systolic CHF, Lyme Disease, Essential Hypertension, Hypothyroidism, Peripheral Neuropathy, OSA, Falls and Paroxysmal Atrial Fibrillation, who presented to the emergency room from his nursing facility (rehabbing [facility]) with AMS and falling  He had 2 falls 01/24 which he did not recall. Per wife, (per RN note) he has been hallucinating, asking where are his cats are and stating that he's at home despite being at [facility]. Wife mentioned that pt's mom and dad have Alzheimer's. Wife also notes that pt tested positive for COVID on 15 Jan. Moreover, pt has fallen 6 times in the past week.       Hospital course / significant events: 01/24: to ED.  BP was 113/95 with otherwise normal vital signs.  Ca 6.2 and a BUN of 38 with creatinine 1.66, CO2 14 and chloride 118 and albumin was 2.4 with AST 44 ALT of 85 and total bili of 5.5.  Lactic acid was 1.3 CBC was within normal.  INR was 1.6 and PTT 20 urinalysis was unremarkable.  EKG showed atrial fibrillation with rate of 102 with poor R wave progression and Q waves in anteriorly as well as repolarization abnormality of prolonged QT interval with QTc 568. CXR no acute cardiopulmonary disease.  Noncontrast head CT scan revealed no acute intracranial abnormalities.  It showed acute right nasal bone fracture. Given 2 L bolus of IV normal saline, a gram of IV calcium  gluconate. 01/25: Cl and CO2 have normalized, renal fxn mildly worse w/ Cr 1.89 and GFR 38, reduced metoprolol  contineu gentle fluids does not appear to be overloaded    Overnight:  notified by RN HR has increased to 140s. EKG ordered. BP soft, denies cp, sob, but endorses palpitations.   Physical Exam Vitals reviewed.  Constitutional:      General: He  is in acute distress.     Appearance: He is obese.  HENT:     Mouth/Throat:     Mouth: Mucous membranes are dry.  Eyes:     Extraocular Movements: Extraocular movements intact.     Pupils: Pupils are equal, round, and reactive to light.  Cardiovascular:     Rate and Rhythm: Tachycardia present. Rhythm irregular.     Pulses:          Radial pulses are 2+ on the right side and 2+ on the left side.     Heart sounds: S1 normal and S2 normal.  Pulmonary:     Effort: Pulmonary effort is normal.     Breath sounds: Examination of the right-middle field reveals decreased breath sounds. Examination of the left-middle field reveals decreased breath sounds. Examination of the right-lower field reveals decreased breath sounds. Examination of the left-lower field reveals decreased breath sounds. Decreased breath sounds present.  Abdominal:     General: Bowel sounds are decreased.  Skin:    General: Skin is warm and dry.     Capillary Refill: Capillary refill takes 2 to 3 seconds.     Coloration: Skin is pale.  Neurological:     Mental Status: He is alert.     GCS: GCS eye subscore is 4. GCS verbal subscore is 4. GCS motor subscore is 6.       Interventions/ Plan  EKG- a fib rvr  Digoxin   Diltiazem  bolus and drip  Transfer to progressive unit  Consult cardiology troponin  Updates     Laneta Gardener- Aram BSN RN CCRN AGACNP-BC Acute Care Nurse Practitioner [facility] [place]  "

## 2024-11-05 NOTE — Progress Notes (Addendum)
 Hypoglycemic Event  CBG: 52  Treatment: 8 oz juice/soda  Symptoms: Shaky  Follow-up CBG: Upfz:[id number] CBG Result:67  Possible Reasons for Event: Unknown  Comments/MD notified: Notified Georjean Edelman NP  At  [phone] BG is 78  Deshonna Trnka LOISE Chance

## 2024-11-05 NOTE — Progress Notes (Signed)
(+)  COVID on swab today  10/24/24 (+)Covid outside the hospital  Question residual viral material but could be new infection No hypoxia, will monitor  CXR no concerns  Concern for UTI UA (+)>50 WBC, clean catch  Rocephin   Culture   Afib rate improved but still tachy  See cardiology note - adjusting rate control If remains low BP would eval for fluid overload before trial another liter fluids but may need pressors if not responding      Discussed updates w/ pt's wife Comer over the phone 11/05/24 5:17 PM

## 2024-11-05 NOTE — Progress Notes (Signed)
 " PROGRESS NOTE    Johnathan [name]   FMW:[id number] DOB: 26-Sep-1956  DOA: 11/03/2024 Date of Service: 11/05/24 which is hospital day 1  PCP: Marylynn Verneita CROME, MD    Johnathan [name] is a 69 y.o. Caucasian male with medical history significant for osteoarthritis, vitamin B12 deficiency, chronic systolic CHF, Lyme Disease, Essential Hypertension, Hypothyroidism, Peripheral Neuropathy, OSA, Falls and Paroxysmal Atrial Fibrillation, who presented to the emergency room from his nursing facility (rehabbing at [facility]) with AMS and falling  HPI: He had 2 falls 01/24 which he did not recall. Per wife, (per RN note) he has been hallucinating, asking where are his cats are and stating that he's at home despite being at [facility]. Wife mentioned that pt's mom and dad have Alzheimer's. Wife also notes that pt tested positive for COVID on 15 Jan. Moreover, pt has fallen 6 times in the past week.     Hospital course / significant events: 01/24: to ED.  BP was 113/95 with otherwise normal vital signs.  Ca 6.2 and a BUN of 38 with creatinine 1.66, CO2 14 and chloride 118 and albumin was 2.4 with AST 44 ALT of 85 and total bili of 5.5.  Lactic acid was 1.3 CBC was within normal.  INR was 1.6 and PTT 20 urinalysis was unremarkable.  EKG showed atrial fibrillation with rate of 102 with poor R wave progression and Q waves in anteriorly as well as repolarization abnormality of prolonged QT interval with QTc 568. CXR no acute cardiopulmonary disease.  Noncontrast head CT scan revealed no acute intracranial abnormalities.  It showed acute right nasal bone fracture. Given 2 L bolus of IV normal saline, a gram of IV calcium  gluconate. 01/25: Cl and CO2 have normalized, renal fxn mildly worse w/ Cr 1.89 and GFR 38, reduced metoprolol  contineu gentle fluids does not appear to be overloaded  01/26: into Afib RVR overnight, cardizem  rate improved but then had to dc this d/t low BP, cardiology consulted, anticipate restart higher  dose beta blocker and if BP dropping may need to sent to ICU for pressor support. Elevated temp this AM 100.83F --> denies fever/chills, reports mild cough, no other complaints --> flu/COVID PCR, CXR, UA, CBC diff, consider abx if temps persist    Consultants:  none  Procedures/Surgeries: none      ASSESSMENT & PLAN:   Afib RVR into Afib RVR overnight --> cardizem  rate improved but then had to dc this d/t low BP cardiology consulted anticipate restart dose beta blocker at increased dose  if BP dropping may need to go to ICU for pressor support.   Elevated temp this AM 100.83F  denies fever/chills, reports mild cough, no other complaints to point to specific infection but if this may also have contirbuted to Afib will look at it  flu/COVID PCR CXR UA CBC diff Blood culture consider abx if temps persist - would wait until AFTER cultures collected to initiate, if possible   AKI (acute kidney injury) - improving  Cr/GFR on admission 1.66/44, baseline 0.8-1.0 and GFR >60 Cr/GFR 11/04/24 1.89 / 38 Cr/GFR today 11/05/24 1.57/50 IV NS fluids, repeat 1L bolus today over 2h (caution for fluid overload given EF 35-40) [place]iro BMP  Avoid nephrotoxins    Hypocalcemia acute on chronic  Ca 6.2 on admission 01/24 --> 8.7 on 01/25 which is baseline   Monitor BMP   Resting tremor upper extremities bilaterally Worse today, improved w/ intentional movement No other concerning nero findings  Monitor, would  not medicate this right now  Metabolic acidosis resolved Can dc bicarbonate infusion. Monitor BMP   Recurrent falls / Presyncope Question cause d/t dehydration / AKI / polypharmacy / CHF BP low and was on high dose metoprolol  suspect this may be the problem along w/ dehydration and diminished renal perfusion would also explain AKI PT/OT Orthostatic vitals   Acute right nasal bone fracture.   Supportive care   Subtherapeutic INR - improving  Pharmacy to dose coumadin     Essential HTN  Low BP here HFrEF - chronic - EF 35-40 Reduced metoprolol  (for Afib rate control) but now into Afib RVR Caution w/ IV fluids in CHF patient - giving additional fluids this morning w/ low BP Given low BP have held home lasix , entresto , spironolactone   Cardiology consulted   Cdiff  Recent infection, completed treatment  Monitor for loose stool  Depression - holding Effexor  XR d/t long QT Continue Wellbutrin  XL.   Dyslipidemia Will continue statin therapy.   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  Underweight - under 18  overweight - 25 to 29 obese - 30 or more Class 1 obesity: BMI of 30.0 to 34 Class 2 obesity: BMI of 35.0 to 39 Class 3 obesity: BMI of 40.0 to 49 Super Morbid Obesity: BMI 50-59 Super-super Morbid Obesity: BMI 60+ Healthy nutrition and physical activity advised as adjunct to other disease management and risk reduction treatments    DVT prophylaxis: Coumadin  IV fluids: 1L NS bolus this morning  Nutrition: cardiac diet Central lines / other devices: none  Code Status: FULL CODE ACP documentation reviewed: has living will on file in [facility] needs: none at this time, expect will need authorization to go back to rehab  Medical barriers to dispo at this time: AKI, Afib.         Subjective / Brief ROS:  Patient reports feels ok other than increased upper extermity tremor, feels chilly but no fever  Denies CP/SOB. Denies palpitations  Pain controlled.  Denies new weakness.  Tolerating diet.    Family Communication: will reach out to family if urgent but will await results for now     Objective Findings:  Vitals:   11/05/24 0735 11/05/24 0814 11/05/24 0854 11/05/24 0900  BP: (!) 73/49  (!) 133/100 (!) 119/94  Pulse:  (!) 127 (!) 118 (!) 120  Resp: 20     Temp: 99 F (37.2  C) (!) 100.5 F (38.1 C)  97.9 F (36.6 C)  TempSrc: Oral Oral  Oral  SpO2: 97% 93%    Weight:      Height:        Intake/Output Summary (Last 24 hours) at 11/05/2024 0936 Last data filed at 11/05/2024 0035 Gross per 24 hour  Intake 371.66 ml  Output 1050 ml  Net -678.34 ml   Filed Weights   11/03/24 2105  Weight: (!) 153.6 kg    Examination:  Physical Exam Constitutional:      General: He is not in acute distress. Cardiovascular:     Rate and Rhythm: Tachycardia present. Rhythm irregular.  Pulmonary:     Effort: Pulmonary effort is normal.     Breath sounds: Normal breath sounds. No rales.  Musculoskeletal:     Right lower leg: Edema (+1) present.     Left lower leg: Edema (+1) present.  Skin:    General: Skin is warm and dry.  Neurological:     Mental Status: He is alert and oriented to person, place, and time.     Comments: Upper extremity tremor/jerking, improves w/ intentional movement (had him reach out in front of him to grab  my hands, coordination is intact and finger to nose no significant tremor)   Psychiatric:        Mood and Affect: Mood normal.        Behavior: Behavior normal.          Scheduled Medications:   buPROPion   300 mg Oral Daily   calcium  carbonate  1 tablet Oral TID WC   cyanocobalamin   500 mcg Oral Daily   gabapentin   300 mg Oral BID   levothyroxine   25 mcg Oral Q0600   metoprolol  succinate  25 mg Oral Daily   multivitamin with minerals  1 tablet Oral Daily   rosuvastatin   10 mg Oral q1800   vitamin E   400 Units Oral Daily   warfarin  2.5 mg Oral ONCE-1600   Warfarin - Pharmacist Dosing Inpatient   Does not apply q1600    Continuous Infusions:  amiodarone  60 mg/hr (11/05/24 0842)   Followed by   amiodarone      sodium chloride        PRN Medications:  acetaminophen  **OR** acetaminophen , oxyCODONE , traZODone   Antimicrobials from admission:  Anti-infectives (From admission, onward)    None           Data  Reviewed:  I have personally reviewed the following...  CBC: Recent Labs  Lab 11/03/24 2111 11/04/24 0550 11/05/24 0629  WBC 9.4 8.1 13.7*  NEUTROABS 5.5  --  13.3*  HGB 14.9 12.7* 13.5  HCT 45.9 39.0 40.3  MCV 90.5 90.3 89.8  PLT 180 133* 120*   Basic Metabolic Panel: Recent Labs  Lab 11/03/24 2220 11/04/24 0550 11/05/24 0630  NA 142 141 139  K 3.6 4.3 4.3  CL 118* 109 107  CO2 14* 22 20*  GLUCOSE 92 112* 82  BUN 38* 46* 39*  CREATININE 1.66* 1.89* 1.57*  CALCIUM  6.2* 8.7* 8.8*  MG 1.9  --   --    GFR: Estimated Creatinine Clearance: 70.4 mL/min (A) (by C-G formula based on SCr of 1.57 mg/dL (H)). Liver Function Tests: Recent Labs  Lab 11/03/24 2220  AST 44*  ALT 85*  ALKPHOS 80  BILITOT 0.3  PROT 4.5*  ALBUMIN 2.4*   No results for input(s): LIPASE, AMYLASE in the last 168 hours. No results for input(s): AMMONIA in the last 168 hours. Coagulation Profile: Recent Labs  Lab 11/03/24 2111 11/04/24 0550 11/05/24 0630  INR 1.6* 1.7* 2.1*   Cardiac Enzymes: No results for input(s): CKTOTAL, CKMB, CKMBINDEX, TROPONINI in the last 168 hours. BNP (last 3 results) No results for input(s): PROBNP in the last 8760 hours. HbA1C: No results for input(s): HGBA1C in the last 72 hours. CBG: Recent Labs  Lab 11/05/24 0408 11/05/24 0426 11/05/24 0440  GLUCAP 52* 67* 78   Lipid Profile: No results for input(s): CHOL, HDL, LDLCALC, TRIG, CHOLHDL, LDLDIRECT in the last 72 hours. Thyroid  Function Tests: Recent Labs    11/04/24 0550  TSH 2.480   Anemia Panel: No results for input(s): VITAMINB12, FOLATE, FERRITIN, TIBC, IRON , RETICCTPCT in the last 72 hours. Most Recent Urinalysis On File:     Component Value Date/Time   COLORURINE AMBER (A) 11/03/2024 2111   APPEARANCEUR HAZY (A) 11/03/2024 2111  APPEARANCEUR Hazy 07/16/2013 0858   LABSPEC 1.021 11/03/2024 2111   LABSPEC 1.019 07/16/2013 0858   PHURINE 5.0  11/03/2024 2111   GLUCOSEU NEGATIVE 11/03/2024 2111   GLUCOSEU NEGATIVE 08/27/2015 1653   HGBUR NEGATIVE 11/03/2024 2111   BILIRUBINUR NEGATIVE 11/03/2024 2111   BILIRUBINUR negative 09/28/2018 1116   BILIRUBINUR Negative 07/16/2013 0858   KETONESUR NEGATIVE 11/03/2024 2111   PROTEINUR NEGATIVE 11/03/2024 2111   UROBILINOGEN 0.2 09/28/2018 1116   UROBILINOGEN 0.2 08/27/2015 1653   NITRITE NEGATIVE 11/03/2024 2111   LEUKOCYTESUR NEGATIVE 11/03/2024 2111   LEUKOCYTESUR Negative 07/16/2013 0858   Sepsis Labs: @LABRCNTIP (procalcitonin:4,lacticidven:4) Microbiology: Recent Results (from the past 240 hours)  MRSA Next Gen by PCR, Nasal     Status: None   Collection Time: 11/04/24  4:20 AM   Specimen: Nasal Mucosa; Nasal Swab  Result Value Ref Range Status   MRSA by PCR Next Gen NOT DETECTED NOT DETECTED Final    Comment: (NOTE) The GeneXpert MRSA Assay (FDA approved for NASAL specimens only), is one component of a comprehensive MRSA colonization surveillance program. It is not intended to diagnose MRSA infection nor to guide or monitor treatment for MRSA infections. Test performance is not FDA approved in patients less than 81 years old. Performed at [facility], [address] [place], [place] [address]       Radiology Studies last 3 days: DG Chest 1 View Result Date: 11/03/2024 EXAM: 1 VIEW(S) XRAY OF THE CHEST 11/03/2024 09:41:28 PM COMPARISON: 10/11/2024 CLINICAL HISTORY: Weakness and fall. FINDINGS: LUNGS AND PLEURA: Low lung volumes. No focal pulmonary opacity. No pleural effusion. No pneumothorax. HEART AND MEDIASTINUM: No acute abnormality of the cardiac and mediastinal silhouettes. BONES AND SOFT TISSUES: Right shoulder arthroplasty partially imaged. Right upper quadrant surgical clips. IMPRESSION: 1. No acute findings. Electronically signed by: Morgane Naveau MD 11/03/2024 09:55 PM EST RP Workstation: [id number]   CT Cervical Spine Wo Contrast Result Date:  11/03/2024 EXAM: CT CERVICAL SPINE WITHOUT CONTRAST 11/03/2024 09:20:40 PM TECHNIQUE: CT of the cervical spine was performed without the administration of intravenous contrast. Multiplanar reformatted images are provided for review. Automated exposure control, iterative reconstruction, and/or weight based adjustment of the mA/kV was utilized to reduce the radiation dose to as low as reasonably achievable. COMPARISON: None available. CLINICAL HISTORY: Neck trauma (Age >= 65y) Neck trauma. Age greater than or equal to 65 years. FINDINGS: BONES AND ALIGNMENT: No acute fracture or traumatic malalignment. DEGENERATIVE CHANGES: Multilevel moderate to severe degenerative changes of the spine. No associated severe osseous neural foraminal or central canal stenosis. SOFT TISSUES: No prevertebral soft tissue swelling. IMPRESSION: 1. No evidence of acute traumatic injury. Electronically signed by: Morgane Naveau MD 11/03/2024 09:35 PM EST RP Workstation: [id number]   CT Head Wo Contrast Result Date: 11/03/2024 EXAM: CT HEAD WITHOUT CONTRAST 11/03/2024 09:20:40 PM TECHNIQUE: CT of the head was performed without the administration of intravenous contrast. Automated exposure control, iterative reconstruction, and/or weight based adjustment of the mA/kV was utilized to reduce the radiation dose to as low as reasonably achievable. COMPARISON: None available. CLINICAL HISTORY: Head trauma, minor (Age >= 65y) Head trauma, minor (Age >= 65 years). FINDINGS: BRAIN AND VENTRICLES: No acute hemorrhage. No evidence of acute infarct. No hydrocephalus. No extra-axial collection. No mass effect or midline shift. ORBITS: No acute abnormality. SINUSES: No acute abnormality. SOFT TISSUES AND SKULL: No acute soft tissue abnormality. Acute right nasal bone fracture. IMPRESSION: 1. Acute right nasal bone fracture. 2. No acute intracranial findings. Electronically signed by:  Morgane Naveau MD 11/03/2024 09:25 PM EST RP Workstation: [id number]           Laneta Blunt, DO [facility] 11/05/2024, 9:36 AM    Dictation software may have been used to generate the above note. Typos may occur and escape review in typed/dictated notes. Please contact Dr Blunt directly for clarity if needed.  Staff may message me via secure chat in Epic  but this may not receive an immediate response,  please page me for urgent matters!  If 7PM-7AM, please contact night coverage [email or web address]       "

## 2024-11-05 NOTE — Progress Notes (Signed)
 Patients HR ranging from 130 - 160s after using the bathroom. Telemetry called to inform that the patient is in Afib RVR . Vital signs checked patient was asymptomatic . Provider on call Laneta Gardener was informed. Charge nurse was informed. EKG was done   At 0410 patient started shaking Blood sugar check done and it was 52 . Provider notified - hypoglycemic protocol followed .  Patient's vitals checked continued to be checked . Provider orders followed.   [id number] provider at bed side  At 0550 Patient transferred to 2A with his belongings .

## 2024-11-06 ENCOUNTER — Inpatient Hospital Stay

## 2024-11-06 DIAGNOSIS — N179 Acute kidney failure, unspecified: Secondary | ICD-10-CM | POA: Diagnosis not present

## 2024-11-06 DIAGNOSIS — I482 Chronic atrial fibrillation, unspecified: Secondary | ICD-10-CM | POA: Diagnosis not present

## 2024-11-06 LAB — CBC
HCT: 39.3 % (ref 39.0–52.0)
Hemoglobin: 12.7 g/dL — ABNORMAL LOW (ref 13.0–17.0)
MCH: 29.7 pg (ref 26.0–34.0)
MCHC: 32.3 g/dL (ref 30.0–36.0)
MCV: 92 fL (ref 80.0–100.0)
Platelets: 104 10*3/uL — ABNORMAL LOW (ref 150–400)
RBC: 4.27 MIL/uL (ref 4.22–5.81)
RDW: 15.6 % — ABNORMAL HIGH (ref 11.5–15.5)
WBC: 25.1 10*3/uL — ABNORMAL HIGH (ref 4.0–10.5)
nRBC: 0 % (ref 0.0–0.2)

## 2024-11-06 LAB — BASIC METABOLIC PANEL WITH GFR
Anion gap: 10 (ref 5–15)
BUN: 49 mg/dL — ABNORMAL HIGH (ref 8–23)
CO2: 24 mmol/L (ref 22–32)
Calcium: 9 mg/dL (ref 8.9–10.3)
Chloride: 106 mmol/L (ref 98–111)
Creatinine, Ser: 1.88 mg/dL — ABNORMAL HIGH (ref 0.61–1.24)
GFR, Estimated: 38 mL/min — ABNORMAL LOW
Glucose, Bld: 81 mg/dL (ref 70–99)
Potassium: 5.5 mmol/L — ABNORMAL HIGH (ref 3.5–5.1)
Sodium: 140 mmol/L (ref 135–145)

## 2024-11-06 LAB — PROTIME-INR
INR: 2.3 — ABNORMAL HIGH (ref 0.8–1.2)
Prothrombin Time: 26.1 s — ABNORMAL HIGH (ref 11.4–15.2)

## 2024-11-06 LAB — HEPATIC FUNCTION PANEL
ALT: 88 U/L — ABNORMAL HIGH (ref 0–44)
AST: 51 U/L — ABNORMAL HIGH (ref 15–41)
Albumin: 3.3 g/dL — ABNORMAL LOW (ref 3.5–5.0)
Alkaline Phosphatase: 112 U/L (ref 38–126)
Bilirubin, Direct: 0.2 mg/dL (ref 0.0–0.2)
Indirect Bilirubin: 0.4 mg/dL (ref 0.3–0.9)
Total Bilirubin: 0.6 mg/dL (ref 0.0–1.2)
Total Protein: 6.4 g/dL — ABNORMAL LOW (ref 6.5–8.1)

## 2024-11-06 MED ORDER — WARFARIN SODIUM 2.5 MG PO TABS
2.5000 mg | ORAL_TABLET | Freq: Once | ORAL | Status: AC
  Administered 2024-11-06: 2.5 mg via ORAL
  Filled 2024-11-06: qty 1

## 2024-11-06 MED ORDER — OXYCODONE HCL 5 MG PO TABS
5.0000 mg | ORAL_TABLET | Freq: Four times a day (QID) | ORAL | Status: DC | PRN
  Administered 2024-11-07: 5 mg via ORAL
  Filled 2024-11-06: qty 1

## 2024-11-06 MED ORDER — SODIUM CHLORIDE 0.9 % IV BOLUS
1000.0000 mL | Freq: Once | INTRAVENOUS | Status: AC
  Administered 2024-11-06: 1000 mL via INTRAVENOUS

## 2024-11-06 MED ORDER — MIDODRINE HCL 5 MG PO TABS
2.5000 mg | ORAL_TABLET | Freq: Three times a day (TID) | ORAL | Status: DC
  Administered 2024-11-06 – 2024-11-08 (×5): 2.5 mg via ORAL
  Filled 2024-11-06 (×5): qty 1

## 2024-11-06 MED ORDER — SODIUM CHLORIDE 0.9 % IV SOLN
2.0000 g | INTRAVENOUS | Status: DC
  Administered 2024-11-06 – 2024-11-07 (×2): 2 g via INTRAVENOUS
  Filled 2024-11-06 (×3): qty 20

## 2024-11-06 MED ORDER — SODIUM ZIRCONIUM CYCLOSILICATE 10 G PO PACK
10.0000 g | PACK | Freq: Two times a day (BID) | ORAL | Status: AC
  Administered 2024-11-06 (×2): 10 g via ORAL
  Filled 2024-11-06 (×2): qty 1

## 2024-11-06 NOTE — TOC Progression Note (Signed)
 Transition of Care [facility]) - Progression Note    Patient Details  Name: Johnathan [name] MRN: [id number] Date of Birth: 1956/02/07  [facility] CM/SW Contact  Shasta DELENA Daring, RN Phone Number: 11/06/2024, 3:34 PM  Clinical Narrative:    RNCM met with patient and his wife. Presented [facility] service offers and medicare ratings.  Patient selected [facility]. Notified rep and linked in hub.  Will follow for discharge readiness.   Expected Discharge Plan: Home w Home Health Services Barriers to Discharge: Continued Medical Work up               Expected Discharge Plan and Services   Discharge Planning Services: CM Consult                                           Social Drivers of Health (SDOH) Interventions SDOH Screenings   Food Insecurity: No Food Insecurity (11/04/2024)  Housing: Low Risk (11/04/2024)  Transportation Needs: No Transportation Needs (11/04/2024)  Utilities: Not At Risk (11/04/2024)  Alcohol Screen: Low Risk (09/04/2024)  Depression (PHQ2-9): Medium Risk (10/09/2024)  Financial Resource Strain: Low Risk (09/04/2024)  Physical Activity: Insufficiently Active (09/04/2024)  Social Connections: Moderately Integrated (11/04/2024)  Recent Concern: Social Connections - Moderately Isolated (10/11/2024)  Stress: No Stress Concern Present (09/04/2024)  Tobacco Use: Low Risk (11/03/2024)  Health Literacy: Adequate Health Literacy (09/04/2024)    Readmission Risk Interventions    11/05/2024    2:45 PM  Readmission Risk Prevention Plan  Transportation Screening Complete  PCP or Specialist Appt within 5-7 Days --  Home Care Screening Complete  Medication Review (RN CM) Complete
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
## 2024-11-06 NOTE — Progress Notes (Signed)
 Physical Therapy Treatment Patient Details Name: Johnathan [name] MRN: [id number] DOB: January 10, 1956 Today's Date: 11/06/2024   History of Present Illness Pt is a 69 y.o. male with medical history significant for osteoarthritis, vitamin B12 deficiency, chronic systolic CHF, Lyme Disease, Essential Hypertension, Hypothyroidism, Peripheral Neuropathy, OSA, Falls and Paroxysmal Atrial Fibrillation, who presented to the ER with acute onset of altered mental status at his SNF. MD assessment includes: AKI, hypocalcemia, metabolic acidosis, and recurrent falls. Pt with transfer to progressive unit d/t fever, hypotension, and tachycardia; cleared for continued participation with PT/OT services.    PT Comments  Patient with recent transfer to progressive care/2A secondary to tachycardia, hypotension; cleared for continued participation with treatment session.  Sleeping upon arrival to room, but easily awakens to voice/light touch upon therapist entry.  Oriented to self, location and general situation, but generally confused to more complex information.  Limited recall/integration of new info, limited insight into deficits; increased time for processing of all information.  Able to complete bed mobility with mod indep; sit/stand from elevated bed surface with RW; min/mod assist for SPT from bed/recliner with RW.  Demonstrates broad BOS with choppy steps, heavy reliance on RW; posterior LOB x1 during transfer, mod assist to recover/maintain balance. Denies dizziness/lightheadedness with transition to upright/mobility.  Generally hypotensive, but asymptomatic and with appropriate chronotropic response to activity.  Additional mobility efforts deferred, as patient expressing desire to each lunch (tray just brought in).  Wife present in room throughout session.   Goals from initial evaluation remain appropriate; date for goals updated to reflect continued POC.   If plan is discharge home, recommend the following:  Assistance with cooking/housework;Assist for transportation;Help with stairs or ramp for entrance;A little help with bathing/dressing/bathroom;Two people to help with walking and/or transfers   Can travel by private vehicle     Yes  Equipment Recommendations       Recommendations for Other Services       Precautions / Restrictions Precautions Precautions: Fall Recall of Precautions/Restrictions: Intact Restrictions Weight Bearing Restrictions Per Provider Order: No     Mobility  Bed Mobility Overal bed mobility: Modified Independent Bed Mobility: Supine to Sit, Sit to Supine     Supine to sit: Modified independent (Device/Increase time) Sit to supine: Modified independent (Device/Increase time)        Transfers Overall transfer level: Needs assistance Equipment used: Rolling walker (2 wheels) Transfers: Sit to/from Stand Sit to Stand: Min assist, From elevated surface Stand pivot transfers: Mod assist, Min assist         General transfer comment: broad BOS with choppy steps, heavy reliance on RW; posterior LOB x1 during transfer, mod assist to recover/maintain balance.  Denies dizziness/lightheadedness with transition to upright/mobility    Ambulation/Gait               General Gait Details: deferred, patient voicing desire to eat lunch tray   Stairs             Wheelchair Mobility     Tilt Bed    Modified Rankin (Stroke Patients Only)       Balance Overall balance assessment: Needs assistance Sitting-balance support: No upper extremity supported, Feet supported Sitting balance-Leahy Scale: Good     Standing balance support: Bilateral upper extremity supported Standing balance-Leahy Scale: Poor Standing balance comment: BUE support on RW, +1 to maintain balance  Communication Communication Communication: No apparent difficulties  Cognition Arousal: Alert Behavior During Therapy: WFL for tasks  assessed/performed                           PT - Cognition Comments: Oriented to self, location and general situation; intermittent confusion to more complex information, increased time for processing, limited recall of new information, limited insight into deficits Following commands: Impaired Following commands impaired: Follows one step commands with increased time    Cueing Cueing Techniques: Verbal cues, Tactile cues, Gestural cues  Exercises Other Exercises Other Exercises: Educated in awareness and compensatory strategies to manage orthostasis; educated on seated LE therex and general safety/level of assist with mobility.  Patient/wife voiced understanding.    General Comments        Pertinent Vitals/Pain Pain Assessment Pain Assessment: Faces Faces Pain Scale: Hurts a little bit Pain Location: generally sore all over Pain Descriptors / Indicators: Sore Pain Intervention(s): Limited activity within patient's tolerance, Monitored during session, Repositioned    Home Living                          Prior Function            PT Goals (current goals can now be found in the care plan section) Acute Rehab PT Goals Patient Stated Goal: To get stronger PT Goal Formulation: With patient Time For Goal Achievement: 11/20/24 Potential to Achieve Goals: Good Progress towards PT goals: Progressing toward goals    Frequency    Min 2X/week      PT Plan      Co-evaluation              AM-PAC PT 6 Clicks Mobility   Outcome Measure  Help needed turning from your back to your side while in a flat bed without using bedrails?: None Help needed moving from lying on your back to sitting on the side of a flat bed without using bedrails?: None Help needed moving to and from a bed to a chair (including a wheelchair)?: A Little Help needed standing up from a chair using your arms (e.g., wheelchair or bedside chair)?: A Little Help needed to walk in  hospital room?: A Little Help needed climbing 3-5 steps with a railing? : A Lot 6 Click Score: 19    End of Session   Activity Tolerance: Patient tolerated treatment well Patient left: in chair;with call bell/phone within reach;with chair alarm set   PT Visit Diagnosis: Difficulty in walking, not elsewhere classified (R26.2);Muscle weakness (generalized) (M62.81)     Time: 8684-8663 PT Time Calculation (min) (ACUTE ONLY): 21 min  Charges:    $Therapeutic Activity: 8-22 mins PT General Charges $$ ACUTE PT VISIT: 1 Visit                    Brooklynn Brandenburg H. Delores, PT, DPT, NCS 11/06/24, 3:58 PM [phone]

## 2024-11-06 NOTE — Plan of Care (Signed)
  Problem: Education: Goal: Knowledge of General Education information will improve Description: Including pain rating scale, medication(s)/side effects and non-pharmacologic comfort measures Outcome: Progressing   Problem: Health Behavior/Discharge Planning: Goal: Ability to manage health-related needs will improve Outcome: Progressing   Problem: Clinical Measurements: Goal: Ability to maintain clinical measurements within normal limits will improve Outcome: Progressing Goal: Will remain free from infection Outcome: Progressing Goal: Diagnostic test results will improve Outcome: Progressing Goal: Respiratory complications will improve Outcome: Progressing Goal: Cardiovascular complication will be avoided Outcome: Progressing   Problem: Activity: Goal: Risk for activity intolerance will decrease Outcome: Progressing   Problem: Nutrition: Goal: Adequate nutrition will be maintained Outcome: Progressing   Problem: Coping: Goal: Level of anxiety will decrease Outcome: Progressing   Problem: Elimination: Goal: Will not experience complications related to bowel motility Outcome: Progressing Goal: Will not experience complications related to urinary retention Outcome: Progressing   Problem: Pain Managment: Goal: General experience of comfort will improve and/or be controlled Outcome: Progressing   Problem: Safety: Goal: Ability to remain free from injury will improve Outcome: Progressing   Problem: Skin Integrity: Goal: Risk for impaired skin integrity will decrease Outcome: Progressing   Problem: Education: Goal: Knowledge of risk factors and measures for prevention of condition will improve Outcome: Progressing   Problem: Coping: Goal: Psychosocial and spiritual needs will be supported Outcome: Progressing   Problem: Respiratory: Goal: Will maintain a patent airway Outcome: Progressing Goal: Complications related to the disease process, condition or treatment  will be avoided or minimized Outcome: Progressing

## 2024-11-06 NOTE — Consult Note (Signed)
 PHARMACY - ANTICOAGULATION CONSULT NOTE  Pharmacy Consult for Warfarin Indication: atrial fibrillation  Allergies[1]  Patient Measurements: Height: 6' 3 (190.5 cm) Weight: (!) 153.6 kg (338 lb 10 oz) IBW/kg (Calculated) : 84.5 HEPARIN DW (KG): 120  Labs: Recent Labs    11/04/24 0550 11/05/24 0629 11/05/24 0630 11/06/24 0251  HGB 12.7* 13.5  --  12.7*  HCT 39.0 40.3  --  39.3  PLT 133* 120*  --  104*  LABPROT 20.7*  --  24.4* 26.1*  INR 1.7*  --  2.1* 2.3*  CREATININE 1.89* 1.51* 1.57* 1.88*    Estimated Creatinine Clearance: 58.8 mL/min (A) (by C-G formula based on SCr of 1.88 mg/dL (H)).   Medical History: Past Medical History:  Diagnosis Date   Arthritis    a. chronic joint pain   B12 deficiency    Chronic systolic CHF (congestive heart failure) (HCC)    a. 2013 Echo: EF of 50-55%, normal right ventricular systolic pressure, normal left atrium; b. 04/2015 Echo: EF 40-45%, inadequate for LV wall motion, mild MR, LA severely dilated @ 52 mm, PASP nl; c. 10/2024 Echo: EF 35-40%, glob HK, mod-sev dil LV. Mild conc LVH. Nl RV fxn. Mildly dil LA. Mild MR. AoV sclerosis w/o stenosis.   Complication of anesthesia    Dysrhythmia    Erythema migrans (Lyme disease) 04/08/2015   History of DVT (deep vein thrombosis) 2004   s/p bariatric surgery   History of stress test    a. there was no ST segment deviation noted during stress,    There is a small defect of moderate severity present in the apex location, suggestive of apical ischemia, intermediate risk, calculated EF 21% but visually appears to be 35-40%   Hypertension    Hypothyroidism    Neuropathy    Obesity    OSA (obstructive sleep apnea)    a. not compliant with CPAP   PAF (paroxysmal atrial fibrillation) (HCC)    a. CHADSVASc at least 2 (HTN and vascular disease); b. on Eliquis     Pre-diabetes    Medications:  Warfarin 2.5 mg daily prior to admission with last dose 11/03/24 at 0900. This was a new discharge dose  on 10/16/24. TWD = 17.5 mg  Regimen prior to 10/11/24 - 10/16/24 admission was 7.5 mg on MoFr and 5 mg EOD = TWD 40 mg  Assessment: 69 y/o M presenting from SNF with fall and AMS. Upon admission, found with AKI. PMH significant for osteoarthritis, vitamin B12 deficiency, chronic systolic CHF, lyme disease, essential hypertension, hypothyroidism, peripheral neuropathy, OSA, falls and paroxysmal atrial fibrillation. Patient also has recent history of COVID (tested positive on 1/16). Currently on Warfarin for Afib. Pharmacy has been consulted for Warfarin dosing and monitoring.  Date INR Warfarin Dose 1/27 2.3  2.5 mg   Goal of Therapy:  INR 2-3   Plan:  -- INR is within therapeutic range -- Will order Warfarin 2.5 mg PO x 1, which is patient's normal home dose -- Daily INR while admitted -- Patient may need regimen adjusted given TWD was recently reduced > 50% and patient subtherapeutic on admission   Ransom Blanch PGY-1 Pharmacy Resident  [place] [facility]  11/06/2024 8:31 AM      [1]  Allergies Allergen Reactions   Eliquis  [Apixaban ] Itching    Red spots and severe itching   Xarelto [Rivaroxaban] Hives    Reaction in 2013

## 2024-11-06 NOTE — Evaluation (Signed)
 Occupational Therapy Evaluation Patient Details Name: Johnathan [name] MRN: [id number] DOB: 1955/10/28 Today's Date: 11/06/2024   History of Present Illness   Pt is a 69 y.o. male with medical history significant for osteoarthritis, vitamin B12 deficiency, chronic systolic CHF, Lyme Disease, Essential Hypertension, Hypothyroidism, Peripheral Neuropathy, OSA, Falls and Paroxysmal Atrial Fibrillation, who presented to the ER with acute onset of altered mental status at his SNF. MD assessment includes: AKI, hypocalcemia, metabolic acidosis, and recurrent falls. Pt with transfer to progressive unit d/t fever, hypotension, and tachycardia.     Clinical Impressions Pt was seen for OT evaluation this date. Pt recently at [facility] for STR following hospitalization 3 weeks ago, prior to that pt was living at home with his wife and MOD I with rollator use for household distances and ADL performance. Pt presents with deficits in strength, balance, activity tolerance and safety limiting their ability to perform ADL management at baseline level. Pt currently requires MOD I with increased time/effort to reach EOB. Required elevated bed height and increased time and cues to stand from EOB to RW + CGA, able to step pivot to recliner with CGA for safety. Seated peri-hygiene performed with CGA via lateral leans and Min A in standing for thoroughness/safety. Pt with stable HR and BP, able to progress mobility ~8 ft with RW and frequent cues for proximity and technique, Min/CGA for safety and notable fatigue although pt reporting 5/10 upon return to bed. Would not have been able to progress further and recommend chair follow for further attempts.  Will follow acutely to maximize return to PLOF. Do anticipate the need for follow up OT services upon acute hospital DC and BSC.      If plan is discharge home, recommend the following:   A little help with walking and/or transfers;A little help with  bathing/dressing/bathroom;Assistance with cooking/housework;Assist for transportation;Help with stairs or ramp for entrance     Functional Status Assessment   Patient has had a recent decline in their functional status and demonstrates the ability to make significant improvements in function in a reasonable and predictable amount of time.     Equipment Recommendations   BSC/3in1     Recommendations for Other Services         Precautions/Restrictions   Precautions Precautions: Fall Recall of Precautions/Restrictions: Intact Restrictions Weight Bearing Restrictions Per Provider Order: No     Mobility Bed Mobility Overal bed mobility: Modified Independent Bed Mobility: Supine to Sit, Sit to Supine     Supine to sit: Modified independent (Device/Increase time) Sit to supine: Modified independent (Device/Increase time)   General bed mobility comments: increased time/effort and use of bed features especially to forward scoot to EOB    Transfers Overall transfer level: Needs assistance Equipment used: Rolling walker (2 wheels) Transfers: Sit to/from Stand, Bed to chair/wheelchair/BSC Sit to Stand: Contact guard assist, From elevated surface     Step pivot transfers: Contact guard assist     General transfer comment: increased time and bed height elevated to stand from bed to RW as well as CGA to SPT to [facility] and progressed mobility ~8 ft before notable fatigue      Balance Overall balance assessment: Needs assistance Sitting-balance support: Feet supported Sitting balance-Leahy Scale: Good     Standing balance support: Bilateral upper extremity supported, During functional activity, Reliant on assistive device for balance Standing balance-Leahy Scale: Poor Standing balance comment: BUE support on RW  ADL either performed or assessed with clinical judgement   ADL Overall ADL's : Needs assistance/impaired                          Toilet Transfer: Contact guard assist;Rolling walker (2 wheels);Minimal assistance;BSC/3in1 Statistician Details (indicate cue type and reason): Min/CGA for safety to SPT from bed to [facility] Toileting- Clothing Manipulation and Hygiene: Supervision/safety;Sitting/lateral lean;Minimal assistance Toileting - Clothing Manipulation Details (indicate cue type and reason): able to perform peri-hygiene seated via lateral leans on BSC, Min A in standing for thoroughness     Functional mobility during ADLs: Minimal assistance;Rolling walker (2 wheels);Contact guard assist General ADL Comments: CGA +RW for safety with SPT from bed>BSC with cues for hand placement and safety/sequencing     Vision         Perception         Praxis         Pertinent Vitals/Pain Pain Assessment Pain Assessment: No/denies pain Pain Intervention(s): Monitored during session, Repositioned     Extremity/Trunk Assessment Upper Extremity Assessment Upper Extremity Assessment: Generalized weakness   Lower Extremity Assessment Lower Extremity Assessment: Generalized weakness       Communication Communication Communication: No apparent difficulties   Cognition Arousal: Alert Behavior During Therapy: WFL for tasks assessed/performed                                 Following commands: Intact       Cueing  General Comments   Cueing Techniques: Verbal cues  HR up to 112 and BP 83/62, 93/72 and 101/70 during session   Exercises Other Exercises Other Exercises: Edu on role of acute OT, falls prevention, ECS including PLB, activity tolerance, AE/DME, and home/routines modifications to maintain safety as pt is wishing to return home vs STR.   Shoulder Instructions      Home Living Family/patient expects to be discharged to:: Skilled nursing facility Living Arrangements: Spouse/significant other Available Help at Discharge: Family;Available 24 hours/day Type of Home:  House Home Access: Ramped entrance     Home Layout: One level     Bathroom Shower/Tub: Chief Strategy Officer: Handicapped height Bathroom Accessibility: Yes   Home Equipment: Rollator (4 wheels);Shower seat;Cane - single Librarian, Academic (2 wheels);Adaptive equipment;Hand held shower head Adaptive Equipment: Reacher;Long-handled sponge Additional Comments: Pt admitted from [facility], there for STR      Prior Functioning/Environment Prior Level of Function : History of Falls (last six months)             Mobility Comments: Prior to recent functional decline and admission to [facility] for STR pt was Mod Ind with amb with a rollator mostly household distances; pt reports extensive fall history, 12+, in the last 6 months due to BLE buckling ADLs Comments: Assist for ADLs in the last two months, Ind prior to that    OT Problem List: Decreased strength;Cardiopulmonary status limiting activity;Decreased activity tolerance;Impaired balance (sitting and/or standing);Decreased knowledge of use of DME or AE   OT Treatment/Interventions: Self-care/ADL training;Therapeutic exercise;Therapeutic activities;Energy conservation;DME and/or AE instruction;Patient/family education;Balance training      OT Goals(Current goals can be found in the care plan section)   Acute Rehab OT Goals Patient Stated Goal: go home OT Goal Formulation: With patient Time For Goal Achievement: 11/20/24 Potential to Achieve Goals: Good ADL Goals Pt Will Perform Upper Body Bathing: with set-up;sitting Pt Will Perform  Lower Body Dressing: with modified independence;with supervision;with adaptive equipment;sitting/lateral leans;sit to/from stand Pt Will Transfer to Toilet: with modified independence;with supervision;bedside commode;regular height toilet;ambulating Pt/caregiver will Perform Home Exercise Program: Increased strength;Both right and left upper extremity;With written HEP provided;With  Supervision Additional ADL Goal #1: Pt will demo implementation of 2 learned ECS/falls prevention strategies during ADL performance and transfers to maximize indep/safety and prevent overexertion.   OT Frequency:  Min 2X/week    Co-evaluation              AM-PAC OT 6 Clicks Daily Activity     Outcome Measure Help from another person eating meals?: A Little Help from another person taking care of personal grooming?: A Little Help from another person toileting, which includes using toliet, bedpan, or urinal?: A Little Help from another person bathing (including washing, rinsing, drying)?: A Lot Help from another person to put on and taking off regular upper body clothing?: A Little Help from another person to put on and taking off regular lower body clothing?: A Lot 6 Click Score: 16   End of Session Equipment Utilized During Treatment: Rolling walker (2 wheels);Gait belt Nurse Communication: Mobility status  Activity Tolerance: Patient tolerated treatment well;Patient limited by fatigue Patient left: in bed;with call bell/phone within reach;with bed alarm set  OT Visit Diagnosis: Other abnormalities of gait and mobility (R26.89);Repeated falls (R29.6);Muscle weakness (generalized) (M62.81)                Time: 9061-8979 OT Time Calculation (min): 42 min Charges:  OT General Charges $OT Visit: 1 Visit OT Evaluation $OT Eval Moderate Complexity: 1 Mod OT Treatments $Self Care/Home Management : 23-37 mins Jeramey Lanuza Chrismon, OTR/L 11/06/24, 11:52 AM  Myrle Wanek E Chrismon 11/06/2024, 11:44 AM

## 2024-11-06 NOTE — Progress Notes (Addendum)
 " PROGRESS NOTE    Johnathan [name]   FMW:[id number] DOB: November 23, 1955  DOA: 11/03/2024 Date of Service: 11/06/24 which is hospital day 2  PCP: Johnathan Verneita CROME, MD    Johnathan [name] is a 69 y.o. Caucasian male with medical history significant for osteoarthritis, vitamin B12 deficiency, chronic systolic CHF, Lyme Disease, Essential Hypertension, Hypothyroidism, Peripheral Neuropathy, OSA, Falls and Paroxysmal Atrial Fibrillation, who presented to the emergency room from his nursing facility (rehabbing [facility]) with AMS and falling  HPI: He had 2 falls 01/24 which he did not recall. Per wife, (per RN note) he has been hallucinating, asking where are his cats are and stating that he's at home despite being at [facility]. Wife mentioned that pt's mom and dad have Alzheimer's. Wife also notes that pt tested positive for COVID on 15 Jan. Moreover, pt has fallen 6 times in the past week.     Hospital course / significant events: 01/24: to ED.  BP was 113/95 with otherwise normal vital signs.  Ca 6.2 and a BUN of 38 with creatinine 1.66, CO2 14 and chloride 118 and albumin was 2.4 with AST 44 ALT of 85 and total bili of 5.5.  Lactic acid was 1.3 CBC was within normal.  INR was 1.6 and PTT 20 urinalysis was unremarkable.  EKG showed atrial fibrillation with rate of 102 with poor R wave progression and Q waves in anteriorly as well as repolarization abnormality of prolonged QT interval with QTc 568. CXR no acute cardiopulmonary disease.  Noncontrast head CT scan revealed no acute intracranial abnormalities.  It showed acute right nasal bone fracture. Given 2 L bolus of IV normal saline, a gram of IV calcium  gluconate. 01/25: Cl and CO2 have normalized, renal fxn mildly worse w/ Cr 1.89 and GFR 38, reduced metoprolol  contineu gentle fluids does not appear to be overloaded  01/26: into Afib RVR overnight, cardizem  rate improved but then had to dc this d/t low BP, cardiology consulted, anticipate restart higher  dose beta blocker and if BP dropping may need to sent to ICU for pressor support. Elevated temp this AM 100.29F --> denies fever/chills, reports mild cough, no other complaints --> flu/COVID PCR, CXR, UA, CBC diff, consider abx if temps persist  01/27: increased WBC, borderline for sepsis (tachycardia ~Afib), Cr up again --> fluids, await UCx, consider expand abx / advanced imaging pending clinical course    Consultants:  Cardiology Nephrology   Procedures/Surgeries: none      ASSESSMENT & PLAN:   CARDIOVASCULAR  Afib RVR into Afib RVR overnight 01/26 --> cardizem  rate improved but then had to dc this d/t low BP cardiology following Titration up on metoprolol  per cardiology  Maintain telemetry  if BP dropping may need to go to ICU for pressor support.   Essential HTN, Low BP here HFrEF - chronic - EF 35-40 Reduced metoprolol  (for Afib rate control) but then into Afib RVR - resume beta blocker and cardiology titrating this  Caution w/ IV fluids in CHF patient - giving additional fluids this morning w/ low BP Given low BP have held home lasix , entresto , spironolactone   Cardiology following    Subtherapeutic INR - improving  Pharmacy to dose coumadin   Dyslipidemia Will continue statin therapy.  Recurrent falls / Presyncope suspect cardiogenic cause w/ hypotension Question cause d/t dehydration / AKI / polypharmacy / CHF BP low and was on high dose metoprolol  suspect this may be the problem along w/ dehydration and diminished renal perfusion would  also explain AKI PT/OT Orthostatic vitals   INFECTIOUS DISEASE  SEPSIS CANNOT BE RULED OUT 11/06/24 AM:  WBC <4 or >12 = YES Temp <96.8 or >100.4 = NO HR >90 = YES IN PRESENCE OF AFIB RR >20 = on one measurement, not sustained and he is on RA Multiple potential infectious sources see below  Await urine and blood cultures  Treating UTI as noted, supportive care for COVID as above  Additional fluids given AKI  Low  threshold for more advanced imaging but he has no respiratory or abdominal/GI complaints. Low threshold for ID consult if not improving / pending cultures   Abnormal UA concerning for UTI No urinary symptoms  Rocephin  Await urine and blood cultures   (+)COVID question residual viral material from recent COVID diagnosed 01/14 per wife he is not complaining of significant respiratory problems other than mild nagging cough, O2 is WNL, CXR no concerns supportive care   Recent Cdiff  Recent infection, completed treatment  Monitor for loose stool - has not had any significant diarrhea here  Would not re-test, as this will almost certainly be positive given recent infection   Leukocytosis  L shift and increasing WBC concern for infectious source - likely UTI / COVID though also w/ recent CDiff infection Monitor CBC and other sepsis parameters as above   RESPIRATORY  (+)COVID question residual viral material from recent COVID diagnosed 01/14 per wife he is not complaining of significant respiratory problems other than mild nagging cough, O2 is WNL, CXR no concerns supportive care   RENAL   AKI (acute kidney injury) - improving then worse again   Cr/GFR on admission 1.66/44, baseline 0.8-1.0 and GFR >60 Cr/GFR 11/04/24 1.89 / 38 Cr/GFR 11/05/24 1.57 / 50 Cr/GFR today 11/06/24 1.88 / 38 IV NS fluids, repeat 1L today Monitor BMP  Avoid nephrotoxins  Given hyperkalemia and not improving, given high risk decompensation, will consult to nephrology   Hyperkalemia Lokelma  x2 doses today Monitor BMP  Hypocalcemia acute on chronic  Ca 6.2 on admission 01/24 --> 8.7 on 01/25 which is baseline   Monitor BMP Canhold calcium  carbonate for now, if levels drop again would resume supplementation    Metabolic acidosis resolved Can dc bicarbonate infusion. Monitor BMP  GASTROINTESTINAL  Cdiff  Recent infection, completed treatment  Monitor for loose stool - has not had any significant  diarrhea here   NEUROLOGICAL  Resting tremor upper extremities bilaterally Worse today, improved w/ intentional movement No other concerning nero findings  Monitor, would not medicate this right now and seems improved today   ENDOCRINE  Hypothyroidism Check TSH --> WNL Continue synthroid    MUSCULOSKELETAL/RHEUM  Acute right nasal bone fracture.   Supportive care   PSYCHIATRIC   Depression - holding Effexor  XR d/t long QT Continue Wellbutrin  XL.   Class 3 morbid based on BMI: Body mass index is 42.33 kg/m.[name] Significantly low or high BMI is associated with higher medical risk.  Underweight - under 18  overweight - 25 to 29 obese - 30 or more Class 1 obesity: BMI of 30.0 to 34 Class 2 obesity: BMI of 35.0 to 39 Class 3 obesity: BMI of 40.0 to 49 Super Morbid Obesity: BMI 50-59 Super-super Morbid Obesity: BMI 60+ Healthy nutrition and physical activity advised as adjunct to other disease management and risk reduction treatments    DVT prophylaxis: Coumadin  IV fluids: 1L NS bolus this morning  Nutrition: cardiac diet Central lines / other devices: none  Code Status: FULL CODE ACP  documentation reviewed: has living will on file in [facility] needs: none at this time, expect will need authorization to go back to rehab  Medical barriers to dispo at this time: AKI, Afib, ?sepsis.         Subjective / Brief ROS:  Patient reports feels ok today Denies abd pain, diarrhea, urinary problems Denies cough/SOB Denies CP.[name] Denies palpitations  Only real complaint is feeling tired  Pain controlled.  Denies new weakness.  Tolerating diet.    Family Communication: call to wife 11/06/24 6:21 PM no answer left HIPAA compliant voicemail to give general update / plan    Objective Findings:  Vitals:   11/06/24 0810 11/06/24 1100 11/06/24 1200 11/06/24 1228  BP: 118/86  (!) 85/61 (!) 85/61  Pulse: (!) 101 97    Resp: 17  (!) 21   Temp: 98.5 F (36.9 C) 98.4 F  (36.9 C)    TempSrc: Oral Oral    SpO2: 98% 97%    Weight:      Height:        Intake/Output Summary (Last 24 hours) at 11/06/2024 1403 Last data filed at 11/06/2024 1100 Gross per 24 hour  Intake 530 ml  Output 1100 ml  Net -570 ml   Filed Weights   11/03/24 2105  Weight: (!) 153.6 kg    Examination:  Physical Exam Constitutional:      General: He is not in acute distress. Cardiovascular:     Rate and Rhythm: Tachycardia present. Rhythm irregular.  Pulmonary:     Effort: Pulmonary effort is normal.     Breath sounds: Normal breath sounds. No rales.  Musculoskeletal:     Right lower leg: Edema (+1) present.     Left lower leg: Edema (+1) present.  Skin:    General: Skin is warm and dry.  Neurological:     Mental Status: He is alert and oriented to person, place, and time.     Comments: Upper extremity tremor/jerking is not present today   Psychiatric:        Mood and Affect: Mood normal.        Behavior: Behavior normal.          Scheduled Medications:   buPROPion   300 mg Oral Daily   calcium  carbonate  1 tablet Oral TID WC   cyanocobalamin   500 mcg Oral Daily   gabapentin   300 mg Oral BID   levothyroxine   25 mcg Oral Q0600   metoprolol  tartrate  25 mg Oral Q6H   multivitamin with minerals  1 tablet Oral Daily   rosuvastatin   10 mg Oral q1800   sodium zirconium cyclosilicate   10 g Oral BID   vitamin E   400 Units Oral Daily   warfarin  2.5 mg Oral ONCE-1600   Warfarin - Pharmacist Dosing Inpatient   Does not apply q1600    Continuous Infusions:  cefTRIAXone  (ROCEPHIN )  IV       PRN Medications:  acetaminophen  **OR** acetaminophen , oxyCODONE , traZODone   Antimicrobials from admission:  Anti-infectives (From admission, onward)    Start     Dose/Rate Route Frequency Ordered Stop   11/06/24 1800  cefTRIAXone  (ROCEPHIN ) 2 g in sodium chloride  0.9 % 100 mL IVPB        2 g 200 mL/hr over 30 Minutes Intravenous Every 24 hours 11/06/24 0814      11/05/24 1830  cefTRIAXone  (ROCEPHIN ) 1 g in sodium chloride  0.9 % 100 mL IVPB  Status:  Discontinued  1 g 200 mL/hr over 30 Minutes Intravenous Every 24 hours 11/05/24 1716 11/06/24 9185           Data Reviewed:  I have personally reviewed the following...  CBC: Recent Labs  Lab 11/03/24 2111 11/04/24 0550 11/05/24 0629 11/06/24 0251  WBC 9.4 8.1 13.7* 25.1*  NEUTROABS 5.5  --  13.3*  --   HGB 14.9 12.7* 13.5 12.7*  HCT 45.9 39.0 40.3 39.3  MCV 90.5 90.3 89.8 92.0  PLT 180 133* 120* 104*   Basic Metabolic Panel: Recent Labs  Lab 11/03/24 2220 11/04/24 0550 11/05/24 0629 11/05/24 0630 11/06/24 0251  NA 142 141 138 139 140  K 3.6 4.3 4.4 4.3 5.5*  CL 118* 109 106 107 106  CO2 14* 22 15* 20* 24  GLUCOSE 92 112* 79 82 81  BUN 38* 46* 40* 39* 49*  CREATININE 1.66* 1.89* 1.51* 1.57* 1.88*  CALCIUM  6.2* 8.7* 8.6* 8.8* 9.0  MG 1.9  --   --   --   --    GFR: Estimated Creatinine Clearance: 58.8 mL/min (A) (by C-G formula based on SCr of 1.88 mg/dL (H)). Liver Function Tests: Recent Labs  Lab 11/03/24 2220  AST 44*  ALT 85*  ALKPHOS 80  BILITOT 0.3  PROT 4.5*  ALBUMIN 2.4*   No results for input(s): LIPASE, AMYLASE in the last 168 hours. No results for input(s): AMMONIA in the last 168 hours. Coagulation Profile: Recent Labs  Lab 11/03/24 2111 11/04/24 0550 11/05/24 0630 11/06/24 0251  INR 1.6* 1.7* 2.1* 2.3*   Cardiac Enzymes: No results for input(s): CKTOTAL, CKMB, CKMBINDEX, TROPONINI in the last 168 hours. BNP (last 3 results) No results for input(s): PROBNP in the last 8760 hours. HbA1C: No results for input(s): HGBA1C in the last 72 hours. CBG: Recent Labs  Lab 11/05/24 0408 11/05/24 0426 11/05/24 0440  GLUCAP 52* 67* 78   Lipid Profile: No results for input(s): CHOL, HDL, LDLCALC, TRIG, CHOLHDL, LDLDIRECT in the last 72 hours. Thyroid  Function Tests: Recent Labs    11/04/24 0550  TSH 2.480    Anemia Panel: No results for input(s): VITAMINB12, FOLATE, FERRITIN, TIBC, IRON , RETICCTPCT in the last 72 hours. Most Recent Urinalysis On File:     Component Value Date/Time   COLORURINE AMBER (A) 11/05/2024 1430   APPEARANCEUR CLOUDY (A) 11/05/2024 1430   APPEARANCEUR Hazy 07/16/2013 0858   LABSPEC 1.021 11/05/2024 1430   LABSPEC 1.019 07/16/2013 0858   PHURINE 5.0 11/05/2024 1430   GLUCOSEU NEGATIVE 11/05/2024 1430   GLUCOSEU NEGATIVE 08/27/2015 1653   HGBUR MODERATE (A) 11/05/2024 1430   BILIRUBINUR NEGATIVE 11/05/2024 1430   BILIRUBINUR negative 09/28/2018 1116   BILIRUBINUR Negative 07/16/2013 0858   KETONESUR NEGATIVE 11/05/2024 1430   PROTEINUR 30 (A) 11/05/2024 1430   UROBILINOGEN 0.2 09/28/2018 1116   UROBILINOGEN 0.2 08/27/2015 1653   NITRITE NEGATIVE 11/05/2024 1430   LEUKOCYTESUR LARGE (A) 11/05/2024 1430   LEUKOCYTESUR Negative 07/16/2013 0858   Sepsis Labs: @LABRCNTIP (procalcitonin:4,lacticidven:4) Microbiology: Recent Results (from the past 240 hours)  MRSA Next Gen by PCR, Nasal     Status: None   Collection Time: 11/04/24  4:20 AM   Specimen: Nasal Mucosa; Nasal Swab  Result Value Ref Range Status   MRSA by PCR Next Gen NOT DETECTED NOT DETECTED Final    Comment: (NOTE) The GeneXpert MRSA Assay (FDA approved for NASAL specimens only), is one component of a comprehensive MRSA colonization surveillance program. It is not intended to diagnose MRSA infection  nor to guide or monitor treatment for MRSA infections. Test performance is not FDA approved in patients less than 23 years old. Performed at [facility], [address] [place], [place] [address]   CULTURE, BLOOD (ROUTINE X 2) w Reflex to ID Panel     Status: None (Preliminary result)   Collection Time: 11/05/24 10:14 AM   Specimen: BLOOD LEFT ARM  Result Value Ref Range Status   Specimen Description   Final    BLOOD LEFT ARM Performed at [facility], [address] [place], [place] [address]    Special Requests   Final    BOTTLES DRAWN AEROBIC AND ANAEROBIC Blood Culture adequate volume Performed at [facility], [address] [place], [place] [address]    Culture   Final    NO GROWTH < 24 HOURS Performed at [facility], [address], [place], [place] [address]    Report Status PENDING  Incomplete  CULTURE, BLOOD (ROUTINE X 2) w Reflex to ID Panel     Status: None (Preliminary result)   Collection Time: 11/05/24 10:14 AM   Specimen: BLOOD  Result Value Ref Range Status   Specimen Description   Final    BLOOD BLOOD RIGHT HAND Performed at [facility], [address], [place], [place] [address]    Special Requests   Final    BOTTLES DRAWN AEROBIC ONLY Blood Culture adequate volume Performed at [facility], [address], [place], [place] [address]    Culture   Final    NO GROWTH < 24 HOURS Performed at [facility], [address] [place], [place] [address]    Report Status PENDING  Incomplete  Resp panel by RT-PCR (RSV, Flu A&B, Covid) Anterior Nasal Swab     Status: Abnormal   Collection Time: 11/05/24 12:43 PM   Specimen: Anterior Nasal Swab  Result Value Ref Range Status   SARS Coronavirus 2 by RT PCR POSITIVE (A) NEGATIVE Final    Comment: (NOTE) SARS-CoV-2 target nucleic acids are DETECTED.  The SARS-CoV-2 RNA is generally detectable in upper respiratory specimens during the acute phase of infection. Positive results are indicative of the presence of the identified virus, but do not rule out bacterial infection or co-infection with other pathogens not detected by the test. Clinical correlation with patient history and other diagnostic information is necessary to determine patient infection status. The expected result is Negative.  Fact Sheet for Patients: bloggercourse.com  Fact Sheet for Healthcare  Providers: seriousbroker.it  This test is not yet approved or cleared by the [place]  FDA and  has been authorized for detection and/or diagnosis of SARS-CoV-2 by FDA under an Emergency Use Authorization (EUA).  This EUA will remain in effect (meaning this test can be used) for the duration of  the COVID-19 declaration under Section 564(b)(1) of the A ct, 21 U.S.C. section 360bbb-3(b)(1), unless the authorization is terminated or revoked sooner.     Influenza A by PCR NEGATIVE NEGATIVE Final   Influenza B by PCR NEGATIVE NEGATIVE Final    Comment: (NOTE) The Xpert Xpress SARS-CoV-2/FLU/RSV plus assay is intended as an aid in the diagnosis of influenza from Nasopharyngeal swab specimens and should not be used as a sole basis for treatment. Nasal washings and aspirates are unacceptable for Xpert Xpress SARS-CoV-2/FLU/RSV testing.  Fact Sheet for Patients: bloggercourse.com  Fact Sheet for Healthcare Providers: seriousbroker.it  This test is not yet approved or cleared by  the [place]  FDA and has been authorized for detection and/or diagnosis of SARS-CoV-2 by FDA under an Emergency Use Authorization (EUA). This EUA will remain in effect (meaning this test can be used) for the duration of the COVID-19 declaration under Section 564(b)(1) of the Act, 21 U.S.C. section 360bbb-3(b)(1), unless the authorization is terminated or revoked.     Resp Syncytial Virus by PCR NEGATIVE NEGATIVE Final    Comment: (NOTE) Fact Sheet for Patients: bloggercourse.com  Fact Sheet for Healthcare Providers: seriousbroker.it  This test is not yet approved or cleared by the [place]  FDA and has been authorized for detection and/or diagnosis of SARS-CoV-2 by FDA under an Emergency Use Authorization (EUA). This EUA will remain in effect (meaning this test can be  used) for the duration of the COVID-19 declaration under Section 564(b)(1) of the Act, 21 U.S.C. section 360bbb-3(b)(1), unless the authorization is terminated or revoked.  Performed at [facility], [address], [place], [place] [address]       Radiology Studies last 3 days: [facility] Chest [facility] 1 View Result Date: 11/06/2024 EXAM: 1 VIEW(S) XRAY OF THE CHEST 11/06/2024 06:18:00 AM COMPARISON: 11/03/2024 CLINICAL HISTORY: Fever. FINDINGS: LUNGS AND PLEURA: Low lung volumes. No focal pulmonary opacity. No pleural effusion. No pneumothorax. HEART AND MEDIASTINUM: Cardiomegaly, unchanged. BONES AND SOFT TISSUES: Partially imaged right shoulder arthroplasty. No acute osseous abnormality. IMPRESSION: 1. No acute findings. Electronically signed by: Waddell Calk MD 11/06/2024 07:12 AM EST RP Workstation: [id number]   DG Chest 1 View Result Date: 11/03/2024 EXAM: 1 VIEW(S) XRAY OF THE CHEST 11/03/2024 09:41:28 PM COMPARISON: 10/11/2024 CLINICAL HISTORY: Weakness and fall. FINDINGS: LUNGS AND PLEURA: Low lung volumes. No focal pulmonary opacity. No pleural effusion. No pneumothorax. HEART AND MEDIASTINUM: No acute abnormality of the cardiac and mediastinal silhouettes. BONES AND SOFT TISSUES: Right shoulder arthroplasty partially imaged. Right upper quadrant surgical clips. IMPRESSION: 1. No acute findings. Electronically signed by: Morgane Naveau MD 11/03/2024 09:55 PM EST RP Workstation: [id number]   CT Cervical Spine Wo Contrast Result Date: 11/03/2024 EXAM: CT CERVICAL SPINE WITHOUT CONTRAST 11/03/2024 09:20:40 PM TECHNIQUE: CT of the cervical spine was performed without the administration of intravenous contrast. Multiplanar reformatted images are provided for review. Automated exposure control, iterative reconstruction, and/or weight based adjustment of the mA/kV was utilized to reduce the radiation dose to as low as reasonably achievable. COMPARISON: None available. CLINICAL HISTORY: Neck  trauma (Age >= 65y) Neck trauma. Age greater than or equal to 65 years. FINDINGS: BONES AND ALIGNMENT: No acute fracture or traumatic malalignment. DEGENERATIVE CHANGES: Multilevel moderate to severe degenerative changes of the spine. No associated severe osseous neural foraminal or central canal stenosis. SOFT TISSUES: No prevertebral soft tissue swelling. IMPRESSION: 1. No evidence of acute traumatic injury. Electronically signed by: Morgane Naveau MD 11/03/2024 09:35 PM EST RP Workstation: [id number]   CT Head Wo Contrast Result Date: 11/03/2024 EXAM: CT HEAD WITHOUT CONTRAST 11/03/2024 09:20:40 PM TECHNIQUE: CT of the head was performed without the administration of intravenous contrast. Automated exposure control, iterative reconstruction, and/or weight based adjustment of the mA/kV was utilized to reduce the radiation dose to as low as reasonably achievable. COMPARISON: None available. CLINICAL HISTORY: Head trauma, minor (Age >= 65y) Head trauma, minor (Age >= 65 years). FINDINGS: BRAIN AND VENTRICLES: No acute hemorrhage. No evidence of acute infarct. No hydrocephalus. No extra-axial collection. No mass effect or midline shift. ORBITS: No acute abnormality. SINUSES: No acute abnormality. SOFT TISSUES AND SKULL: No acute soft tissue abnormality. Acute  right nasal bone fracture. IMPRESSION: 1. Acute right nasal bone fracture. 2. No acute intracranial findings. Electronically signed by: Morgane Naveau MD 11/03/2024 09:25 PM EST RP Workstation: [id number]          Anatalia Kronk, DO [facility] 11/06/2024, 2:03 PM    Dictation software may have been used to generate the above note. Typos may occur and escape review in typed/dictated notes. Please contact Dr Marsa directly for clarity if needed.  Staff may message me via secure chat in Epic  but this may not receive an immediate response,  please page me for urgent matters!  If 7PM-7AM, please contact night  coverage [email or web address]       "

## 2024-11-07 ENCOUNTER — Telehealth: Payer: Self-pay

## 2024-11-07 DIAGNOSIS — N179 Acute kidney failure, unspecified: Secondary | ICD-10-CM | POA: Diagnosis not present

## 2024-11-07 LAB — CBC WITH DIFFERENTIAL/PLATELET
Abs Immature Granulocytes: 0.31 10*3/uL — ABNORMAL HIGH (ref 0.00–0.07)
Basophils Absolute: 0 10*3/uL (ref 0.0–0.1)
Basophils Relative: 0 %
Eosinophils Absolute: 0.2 10*3/uL (ref 0.0–0.5)
Eosinophils Relative: 1 %
HCT: 36.2 % — ABNORMAL LOW (ref 39.0–52.0)
Hemoglobin: 12.1 g/dL — ABNORMAL LOW (ref 13.0–17.0)
Immature Granulocytes: 2 %
Lymphocytes Relative: 8 %
Lymphs Abs: 1.3 10*3/uL (ref 0.7–4.0)
MCH: 30.2 pg (ref 26.0–34.0)
MCHC: 33.4 g/dL (ref 30.0–36.0)
MCV: 90.3 fL (ref 80.0–100.0)
Monocytes Absolute: 1.6 10*3/uL — ABNORMAL HIGH (ref 0.1–1.0)
Monocytes Relative: 10 %
Neutro Abs: 13.6 10*3/uL — ABNORMAL HIGH (ref 1.7–7.7)
Neutrophils Relative %: 79 %
Platelets: 98 10*3/uL — ABNORMAL LOW (ref 150–400)
RBC: 4.01 MIL/uL — ABNORMAL LOW (ref 4.22–5.81)
RDW: 15.6 % — ABNORMAL HIGH (ref 11.5–15.5)
WBC: 17.1 10*3/uL — ABNORMAL HIGH (ref 4.0–10.5)
nRBC: 0 % (ref 0.0–0.2)

## 2024-11-07 LAB — COMPREHENSIVE METABOLIC PANEL WITH GFR
ALT: 69 U/L — ABNORMAL HIGH (ref 0–44)
AST: 41 U/L (ref 15–41)
Albumin: 3.3 g/dL — ABNORMAL LOW (ref 3.5–5.0)
Alkaline Phosphatase: 269 U/L — ABNORMAL HIGH (ref 38–126)
Anion gap: 11 (ref 5–15)
BUN: 34 mg/dL — ABNORMAL HIGH (ref 8–23)
CO2: 21 mmol/L — ABNORMAL LOW (ref 22–32)
Calcium: 8.3 mg/dL — ABNORMAL LOW (ref 8.9–10.3)
Chloride: 105 mmol/L (ref 98–111)
Creatinine, Ser: 0.97 mg/dL (ref 0.61–1.24)
GFR, Estimated: >60 mL/min
Glucose, Bld: 95 mg/dL (ref 70–99)
Potassium: 3.9 mmol/L (ref 3.5–5.1)
Sodium: 137 mmol/L (ref 135–145)
Total Bilirubin: 0.5 mg/dL (ref 0.0–1.2)
Total Protein: 6.3 g/dL — ABNORMAL LOW (ref 6.5–8.1)

## 2024-11-07 LAB — PROTIME-INR
INR: 2.3 — ABNORMAL HIGH (ref 0.8–1.2)
Prothrombin Time: 26 s — ABNORMAL HIGH (ref 11.4–15.2)

## 2024-11-07 MED ORDER — WARFARIN SODIUM 2.5 MG PO TABS
2.5000 mg | ORAL_TABLET | Freq: Once | ORAL | Status: AC
  Administered 2024-11-07: 2.5 mg via ORAL
  Filled 2024-11-07: qty 1

## 2024-11-07 NOTE — Telephone Encounter (Signed)
 Copied from CRM #[id number]. Topic: General - Other >> Nov 07, 2024  2:06 PM Macario HERO wrote: Reason for CRM: Patient spouse calling requesting a call back from [facility] regarding patient back in hospital.  Call back: [phone]

## 2024-11-07 NOTE — Telephone Encounter (Signed)
 LMTCB

## 2024-11-07 NOTE — Plan of Care (Signed)
  Problem: Education: Goal: Knowledge of General Education information will improve Description: Including pain rating scale, medication(s)/side effects and non-pharmacologic comfort measures Outcome: Progressing   Problem: Clinical Measurements: Goal: Ability to maintain clinical measurements within normal limits will improve Outcome: Progressing   Problem: Clinical Measurements: Goal: Diagnostic test results will improve Outcome: Progressing   

## 2024-11-07 NOTE — Consult Note (Addendum)
 PHARMACY - ANTICOAGULATION CONSULT NOTE  Pharmacy Consult for Warfarin Indication: atrial fibrillation  Allergies[1]  Patient Measurements: Height: 6' 3 (190.5 cm) Weight: (!) 153.6 kg (338 lb 10 oz) IBW/kg (Calculated) : 84.5 HEPARIN DW (KG): 120  Labs: Recent Labs    11/05/24 0629 11/05/24 0630 11/06/24 0251 11/07/24 0507  HGB 13.5  --  12.7* 12.1*  HCT 40.3  --  39.3 36.2*  PLT 120*  --  104* 98*  LABPROT  --  24.4* 26.1* 26.0*  INR  --  2.1* 2.3* 2.3*  CREATININE 1.51* 1.57* 1.88* 0.97    Estimated Creatinine Clearance: 114 mL/min (by C-G formula based on SCr of 0.97 mg/dL).   Medical History: Past Medical History:  Diagnosis Date   Arthritis    a. chronic joint pain   B12 deficiency    Chronic systolic CHF (congestive heart failure) (HCC)    a. 2013 Echo: EF of 50-55%, normal right ventricular systolic pressure, normal left atrium; b. 04/2015 Echo: EF 40-45%, inadequate for LV wall motion, mild MR, LA severely dilated @ 52 mm, PASP nl; c. 10/2024 Echo: EF 35-40%, glob HK, mod-sev dil LV. Mild conc LVH. Nl RV fxn. Mildly dil LA. Mild MR. AoV sclerosis w/o stenosis.   Complication of anesthesia    Dysrhythmia    Erythema migrans (Lyme disease) 04/08/2015   History of DVT (deep vein thrombosis) 2004   s/p bariatric surgery   History of stress test    a. there was no ST segment deviation noted during stress,    There is a small defect of moderate severity present in the apex location, suggestive of apical ischemia, intermediate risk, calculated EF 21% but visually appears to be 35-40%   Hypertension    Hypothyroidism    Neuropathy    Obesity    OSA (obstructive sleep apnea)    a. not compliant with CPAP   PAF (paroxysmal atrial fibrillation) (HCC)    a. CHADSVASc at least 2 (HTN and vascular disease); b. on Eliquis     Pre-diabetes    Medications:  Warfarin 2.5 mg daily prior to admission with last dose 11/03/24 at 0900. This was a new discharge dose on 10/16/24.  TWD = 17.5 mg  Regimen prior to 10/11/24 - 10/16/24 admission was 7.5 mg on MoFr and 5 mg EOD = TWD 40 mg  Assessment: 69 y/o M presenting from SNF with fall and AMS. Upon admission, found with AKI. PMH significant for osteoarthritis, vitamin B12 deficiency, chronic systolic CHF, lyme disease, essential hypertension, hypothyroidism, peripheral neuropathy, OSA, falls and paroxysmal atrial fibrillation. Patient also has recent history of COVID (tested positive on 1/16). Currently on Warfarin for Afib. Pharmacy has been consulted for Warfarin dosing and monitoring.  DDI: ceftriaxone  (increase INR), amio (increase INR, but was stopped)   Date INR Warfarin Dose  1/25 1.7 4 mg  1/26 2.1 2.5 mg   1/27 2.3 2.5 mg  1/28 2.3 2.5 mg      Goal of Therapy:  INR 2-3   Plan:  -- INR is within therapeutic range -- Will order Warfarin 2.5 mg PO x 1, which is patient's normal home dose -- PLTs are downtrending (104 >> 98). Will continue to monitor  -- Daily INR and CBC at least every 3 days while admitted -- Patient may need regimen adjusted given TWD was recently reduced > 50% and patient subtherapeutic on admission   Ransom Blanch PGY-1 Pharmacy Resident  [place] [facility]  11/07/2024 7:28 AM       [  1]  Allergies Allergen Reactions   Eliquis  [Apixaban ] Itching    Red spots and severe itching   Xarelto [Rivaroxaban] Hives    Reaction in 2013

## 2024-11-07 NOTE — Progress Notes (Signed)
 PT Cancellation Note  Patient Details Name: Johnathan [name] MRN: [id number] DOB: [birth year]   Cancelled Treatment:    Reason Eval/Treat Not Completed: Fatigue/lethargy limiting ability to participate (Chart reviewed, treatment attempted. Pt reports feeling quite poorly still, too much mlaaise to participate in PT at this time.) Author confirmed that pt/wife are not interested in DC to STR/SNF at this time, that their desire is to DC straight to home. Author confirmed pt has a WC available. Wife also reports she will bring pt's shoes and bilat AFOs tomorrow to aid in participating in mobility while here at the hospital.   2:30 PM, 11/07/24 [name] Linear, PT, DPT Physical Therapist - [facility]  [phone] (ASCOM)     [name] C 11/07/2024, 2:28 PM

## 2024-11-07 NOTE — Progress Notes (Signed)
 " PROGRESS NOTE    Johnathan [name]  FMW:[id number] DOB: 1956/03/29 DOA: 11/03/2024 PCP: Marylynn Verneita CROME, MD  [id number]  LOS: 3 days   Brief hospital course:   Assessment & Plan: Johnathan [name] is a 69 y.o. Caucasian male with medical history significant for osteoarthritis, vitamin B12 deficiency, chronic systolic CHF, Lyme Disease, Essential Hypertension, Hypothyroidism, Peripheral Neuropathy, OSA, Falls and Paroxysmal Atrial Fibrillation, who presented to the emergency room from his nursing facility (rehabbing at [facility]) with AMS and falling   Wife also notes that pt tested positive for COVID on 15 Jan. Moreover, pt has fallen 6 times in the past week.   Afib RVR into Afib RVR overnight 01/26 --> cardizem  rate improved but then had to dc d/t low BP cardiology following Cont lopressor    Essential HTN, not currently active  Hypotension --cont midodrine   HFrEF - chronic - EF 35-40 Given low BP have held home lasix , entresto , spironolactone   Cardiology following   Cont lopressor    Subtherapeutic INR - improving  Pharmacy to dose coumadin    Dyslipidemia continue statin therapy.   Recurrent falls / Presyncope suspect cardiogenic cause w/ hypotension Question cause d/t dehydration / AKI / polypharmacy / CHF BP low and was on high dose metoprolol  suspect this may be the problem along w/ dehydration and diminished renal perfusion would also explain AKI PT/OT  Sepsis ruled out  Abnormal UA concerning for UTI No urinary symptoms  Cont ceftriaxone  pending urine cx result   (+)COVID question residual viral material from recent COVID diagnosed 01/14 per wife he is not complaining of significant respiratory problems other than mild nagging cough, O2 is WNL, CXR no concerns supportive care    Recent Cdiff  Recent infection, completed treatment    Leukocytosis  L shift and increasing WBC concern for infectious source - likely UTI / COVID though also w/ recent CDiff  infection Monitor CBC and other sepsis parameters as above   AKI (acute kidney injury)  --resolved --oral hydration   Hyperkalemia Lokelma  x2 doses  monitor   Hypocalcemia acute on chronic  Ca 6.2 on admission 01/24 --> 8.7 on 01/25 which is baseline   Monitor BMP   Metabolic acidosis resolved  Resting tremor upper extremities bilaterally No other concerning neuro findings   Hypothyroidism Continue synthroid     Acute right nasal bone fracture.   Supportive care   Depression - holding Effexor  XR d/t long QT Continue Wellbutrin  XL.  Class 3 morbid based on BMI: Body mass index is 42.33 kg/m.Johnathan [name]    DVT prophylaxis: On:Coumadin  Code Status: Full code  Family Communication: wife updated at bedside today Level of care: Progressive Dispo:   The patient is from: SNF rehab Anticipated d/c is to: home (per pt and wife preference) Anticipated d/c date is: 1-2 days   Subjective and Interval History:  Pt reported his back side hurt from lying on it too much.  Denied dysuria.   Objective: Vitals:   11/07/24 1300 11/07/24 1400 11/07/24 1600 11/07/24 1700  BP:  103/72 123/81   Pulse:   (!) 48   Resp:  20 17   Temp: 98.2 F (36.8 C)   98.3 F (36.8 C)  TempSrc: Oral   Oral  SpO2:   94%   Weight:      Height:        Intake/Output Summary (Last 24 hours) at 11/07/2024 1837 Last data filed at 11/07/2024 0514 Gross per 24 hour  Intake --  Output 1200 ml  Net -1200 ml   Filed Weights   11/03/24 2105  Weight: (!) 153.6 kg    Examination:   Constitutional: NAD, AAOx3 HEENT: conjunctivae and lids normal, EOMI CV: No cyanosis.   RESP: normal respiratory effort, on RA Neuro: II - XII grossly intact.   Psych: Normal mood and affect.  Appropriate judgement and reason   Data Reviewed: I have personally reviewed labs and imaging studies  Time spent: 50 minutes  Ellouise Haber, MD [facility] If 7PM-7AM, please contact night-coverage 11/07/2024, 6:37 PM   "

## 2024-11-07 NOTE — Care Management Important Message (Signed)
 Important Message  Patient Details  Name: Johnathan [name] MRN: [id number] Date of Birth: March 24, 1956   Important Message Given:  Yes - Medicare IM     Sanders Manninen 11/07/2024, 11:20 AM

## 2024-11-08 DIAGNOSIS — I420 Dilated cardiomyopathy: Secondary | ICD-10-CM

## 2024-11-08 DIAGNOSIS — N179 Acute kidney failure, unspecified: Secondary | ICD-10-CM | POA: Diagnosis not present

## 2024-11-08 DIAGNOSIS — E872 Acidosis, unspecified: Secondary | ICD-10-CM | POA: Diagnosis not present

## 2024-11-08 DIAGNOSIS — R296 Repeated falls: Secondary | ICD-10-CM | POA: Diagnosis not present

## 2024-11-08 DIAGNOSIS — I5022 Chronic systolic (congestive) heart failure: Secondary | ICD-10-CM | POA: Diagnosis not present

## 2024-11-08 DIAGNOSIS — I482 Chronic atrial fibrillation, unspecified: Secondary | ICD-10-CM | POA: Diagnosis not present

## 2024-11-08 LAB — PROTIME-INR
INR: 2.2 — ABNORMAL HIGH (ref 0.8–1.2)
Prothrombin Time: 25.3 s — ABNORMAL HIGH (ref 11.4–15.2)

## 2024-11-08 LAB — MAGNESIUM: Magnesium: 2.2 mg/dL (ref 1.7–2.4)

## 2024-11-08 LAB — COMPREHENSIVE METABOLIC PANEL WITH GFR
ALT: 52 U/L — ABNORMAL HIGH (ref 0–44)
AST: 27 U/L (ref 15–41)
Albumin: 3 g/dL — ABNORMAL LOW (ref 3.5–5.0)
Alkaline Phosphatase: 116 U/L (ref 38–126)
Anion gap: 12 (ref 5–15)
BUN: 20 mg/dL (ref 8–23)
CO2: 22 mmol/L (ref 22–32)
Calcium: 8.1 mg/dL — ABNORMAL LOW (ref 8.9–10.3)
Chloride: 106 mmol/L (ref 98–111)
Creatinine, Ser: 0.89 mg/dL (ref 0.61–1.24)
GFR, Estimated: >60 mL/min
Glucose, Bld: 76 mg/dL (ref 70–99)
Potassium: 3.7 mmol/L (ref 3.5–5.1)
Sodium: 139 mmol/L (ref 135–145)
Total Bilirubin: 0.5 mg/dL (ref 0.0–1.2)
Total Protein: 6.4 g/dL — ABNORMAL LOW (ref 6.5–8.1)

## 2024-11-08 LAB — CBC
HCT: 36.1 % — ABNORMAL LOW (ref 39.0–52.0)
Hemoglobin: 11.5 g/dL — ABNORMAL LOW (ref 13.0–17.0)
MCH: 29.1 pg (ref 26.0–34.0)
MCHC: 31.9 g/dL (ref 30.0–36.0)
MCV: 91.4 fL (ref 80.0–100.0)
Platelets: 95 10*3/uL — ABNORMAL LOW (ref 150–400)
RBC: 3.95 MIL/uL — ABNORMAL LOW (ref 4.22–5.81)
RDW: 15.4 % (ref 11.5–15.5)
WBC: 12.9 10*3/uL — ABNORMAL HIGH (ref 4.0–10.5)
nRBC: 0 % (ref 0.0–0.2)

## 2024-11-08 LAB — URINE CULTURE: Culture: >=100000 — AB

## 2024-11-08 MED ORDER — METOPROLOL TARTRATE 25 MG PO TABS
37.5000 mg | ORAL_TABLET | Freq: Four times a day (QID) | ORAL | Status: DC
  Administered 2024-11-08 – 2024-11-10 (×7): 37.5 mg via ORAL
  Filled 2024-11-08 (×7): qty 2

## 2024-11-08 MED ORDER — VENLAFAXINE HCL ER 75 MG PO CP24
300.0000 mg | ORAL_CAPSULE | Freq: Every day | ORAL | Status: DC
  Administered 2024-11-08 – 2024-11-11 (×4): 300 mg via ORAL
  Filled 2024-11-08 (×4): qty 4

## 2024-11-08 MED ORDER — WARFARIN SODIUM 2.5 MG PO TABS
2.5000 mg | ORAL_TABLET | Freq: Once | ORAL | Status: AC
  Administered 2024-11-08: 2.5 mg via ORAL
  Filled 2024-11-08: qty 1

## 2024-11-08 MED ORDER — MIDODRINE HCL 5 MG PO TABS
5.0000 mg | ORAL_TABLET | Freq: Three times a day (TID) | ORAL | Status: DC
  Administered 2024-11-08 – 2024-11-09 (×5): 5 mg via ORAL
  Filled 2024-11-08 (×5): qty 1

## 2024-11-08 NOTE — Progress Notes (Signed)
 Pulled off tele wires, gown and pulse ox. Confused , agitated. States...' this is all bullshit, I'm calling the cops! Able to re-orient pt after a long calm conversation. Gown replaced and equipment reattached.

## 2024-11-08 NOTE — Consult Note (Signed)
 PHARMACY - ANTICOAGULATION CONSULT NOTE  Pharmacy Consult for Warfarin Indication: atrial fibrillation  Allergies[1]  Patient Measurements: Height: 6' 3 (190.5 cm) Weight: (!) 142.3 kg (313 lb 11.4 oz) IBW/kg (Calculated) : 84.5 HEPARIN DW (KG): 120  Labs: Recent Labs    11/06/24 0251 11/07/24 0507 11/08/24 0508  HGB 12.7* 12.1* 11.5*  HCT 39.3 36.2* 36.1*  PLT 104* 98* 95*  LABPROT 26.1* 26.0* 25.3*  INR 2.3* 2.3* 2.2*  CREATININE 1.88* 0.97 0.89    Estimated Creatinine Clearance: 119.2 mL/min (by C-G formula based on SCr of 0.89 mg/dL).   Medical History: Past Medical History:  Diagnosis Date   Arthritis    a. chronic joint pain   B12 deficiency    Chronic systolic CHF (congestive heart failure) (HCC)    a. 2013 Echo: EF of 50-55%, normal right ventricular systolic pressure, normal left atrium; b. 04/2015 Echo: EF 40-45%, inadequate for LV wall motion, mild MR, LA severely dilated @ 52 mm, PASP nl; c. 10/2024 Echo: EF 35-40%, glob HK, mod-sev dil LV. Mild conc LVH. Nl RV fxn. Mildly dil LA. Mild MR. AoV sclerosis w/o stenosis.   Complication of anesthesia    Dysrhythmia    Erythema migrans (Lyme disease) 04/08/2015   History of DVT (deep vein thrombosis) 2004   s/p bariatric surgery   History of stress test    a. there was no ST segment deviation noted during stress,    There is a small defect of moderate severity present in the apex location, suggestive of apical ischemia, intermediate risk, calculated EF 21% but visually appears to be 35-40%   Hypertension    Hypothyroidism    Neuropathy    Obesity    OSA (obstructive sleep apnea)    a. not compliant with CPAP   PAF (paroxysmal atrial fibrillation) (HCC)    a. CHADSVASc at least 2 (HTN and vascular disease); b. on Eliquis     Pre-diabetes    Medications:  Warfarin 2.5 mg daily prior to admission with last dose 11/03/24 at 0900. This was a new discharge dose on 10/16/24. TWD = 17.5 mg  Regimen prior to 10/11/24  - 10/16/24 admission was 7.5 mg on MoFr and 5 mg EOD = TWD 40 mg  Assessment: 69 y/o M presenting from SNF with fall and AMS. Upon admission, found with AKI. PMH significant for osteoarthritis, vitamin B12 deficiency, chronic systolic CHF, lyme disease, essential hypertension, hypothyroidism, peripheral neuropathy, OSA, falls and paroxysmal atrial fibrillation. Patient also has recent history of COVID (tested positive on 1/16). Currently on Warfarin for Afib. Pharmacy has been consulted for Warfarin dosing and monitoring.  DDI: ceftriaxone  (increase INR), amio (increase INR, but was stopped)   Date INR Warfarin Dose  1/25 1.7 4 mg  1/26 2.1 2.5 mg   1/27 2.3 2.5 mg  1/28 2.3 2.5 mg   1/29 2.2 2.5 mg      Goal of Therapy:  INR 2-3   Plan:  -- INR is within therapeutic range -- Will order Warfarin 2.5 mg PO x 1, which is patient's normal home dose -- PLTs are downtrending (104 >> 98 >> 95). Hgb also downtrending a little this AM (12.7 >> 12.1 >> 11.5).  -- Per RN, no signs of external bleeding. Urine also yellow and clear. Will continue to monitor  -- Daily INR and CBC at least every 3 days while admitted -- Patient may need regimen adjusted given TWD was recently reduced > 50% and patient subtherapeutic on admission  Ransom Blanch PGY-1 Pharmacy Resident  [facility]  11/08/2024 8:00 AM        [1]  Allergies Allergen Reactions   Eliquis  [Apixaban ] Itching    Red spots and severe itching   Xarelto [Rivaroxaban] Hives    Reaction in 2013

## 2024-11-08 NOTE — Progress Notes (Signed)
 "  Rounding Note   Patient Name: Johnathan [name] Date of Encounter: 11/08/2024  [facility] Cardiologist: Evalene Lunger, MD   Subjective Resting comfortably in bed, reports feeling well Denies significant shortness of breath or chest pain Telemetry reviewed, atrial fibrillation rate 110 up to 120  Scheduled Meds:  buPROPion   300 mg Oral Daily   cyanocobalamin   500 mcg Oral Daily   gabapentin   300 mg Oral BID   levothyroxine   25 mcg Oral Q0600   metoprolol  tartrate  37.5 mg Oral Q6H   midodrine   5 mg Oral TID WC   multivitamin with minerals  1 tablet Oral Daily   rosuvastatin   10 mg Oral q1800   venlafaxine  XR  300 mg Oral Q breakfast   vitamin E   400 Units Oral Daily   warfarin  2.5 mg Oral ONCE-1600   Warfarin - Pharmacist Dosing Inpatient   Does not apply q1600   Continuous Infusions:  PRN Meds: acetaminophen  **OR** acetaminophen , oxyCODONE , traZODone    Vital Signs  Vitals:   11/08/24 0500 11/08/24 0818 11/08/24 1000 11/08/24 1200  BP:  118/69 105/87 112/78  Pulse:  (!) 144 (!) 116 (!) 110  Resp: 19   (!) 25  Temp:  97.8 F (36.6 C) 98.1 F (36.7 C) 98.5 F (36.9 C)  TempSrc:  Oral Oral Oral  SpO2:    95%  Weight: (!) 142.3 kg     Height:        Intake/Output Summary (Last 24 hours) at 11/08/2024 1456 Last data filed at 11/08/2024 0900 Gross per 24 hour  Intake --  Output 650 ml  Net -650 ml      11/08/2024    5:00 AM 11/03/2024    9:05 PM 10/15/2024    5:20 AM  Last 3 Weights  Weight (lbs) 313 lb 11.4 oz 338 lb 10 oz 339 lb 1.1 oz  Weight (kg) 142.3 kg 153.6 kg 153.8 kg      Telemetry Atrial fibrillation rate 110 up to 130 in bed- Personally Reviewed  ECG   - Personally Reviewed  Physical Exam  GEN: No acute distress.   Neck: No JVD Cardiac: Irregular irregular, no murmurs, rubs, or gallops.  Respiratory: Clear to auscultation bilaterally. GI: Soft, nontender, non-distended  MS: No edema; No deformity. Neuro:  Nonfocal  Psych:  Normal affect   Labs High Sensitivity Troponin:  No results for input(s): TROPONINIHS in the last 720 hours.  Recent Labs  Lab 11/05/24 0630 11/05/24 0836  TRNPT 50* 50*       Chemistry Recent Labs  Lab 11/03/24 2220 11/04/24 0550 11/06/24 0251 11/07/24 0507 11/08/24 0508  NA 142   < > 140 137 139  K 3.6   < > 5.5* 3.9 3.7  CL 118*   < > 106 105 106  CO2 14*   < > 24 21* 22  GLUCOSE 92   < > 81 95 76  BUN 38*   < > 49* 34* 20  CREATININE 1.66*   < > 1.88* 0.97 0.89  CALCIUM  6.2*   < > 9.0 8.3* 8.1*  MG 1.9  --   --   --  2.2  PROT 4.5*  --  6.4* 6.3* 6.4*  ALBUMIN 2.4*  --  3.3* 3.3* 3.0*  AST 44*  --  51* 41 27  ALT 85*  --  88* 69* 52*  ALKPHOS 80  --  112 269* 116  BILITOT 0.3  --  0.6 0.5  0.5  GFRNONAA 44*   < > 38* >60 >60  ANIONGAP 9   < > 10 11 12    < > = values in this interval not displayed.    Lipids No results for input(s): CHOL, TRIG, HDL, LABVLDL, LDLCALC, CHOLHDL in the last 168 hours.  Hematology Recent Labs  Lab 11/06/24 0251 11/07/24 0507 11/08/24 0508  WBC 25.1* 17.1* 12.9*  RBC 4.27 4.01* 3.95*  HGB 12.7* 12.1* 11.5*  HCT 39.3 36.2* 36.1*  MCV 92.0 90.3 91.4  MCH 29.7 30.2 29.1  MCHC 32.3 33.4 31.9  RDW 15.6* 15.6* 15.4  PLT 104* 98* 95*   Thyroid   Recent Labs  Lab 11/04/24 0550  TSH 2.480    BNPNo results for input(s): BNP, PROBNP in the last 168 hours.  DDimer No results for input(s): DDIMER in the last 168 hours.   Radiology  No results found.  Cardiac Studies     Patient Profile   69 y.o. male with a past medical history of persistent atrial fibrillation (diagnosed 2013, persistent since 04/2023), morbid obesity, hypertension, history of DVT (right lower extremity, 2004), OSA not on CPAP, chronic joint pain, chronic HFmrEF/dilated cardiomyopathy, hypothyroidism, chronic venous insufficiency, who has been seen and evaluated for atrial fibrillation with RVR.   Assessment & Plan  Chronic persistent  atrial fibrillation with RVR -Prior attempts over the past several years to maintain normal sinus rhythm unsuccessful even on amiodarone  (lost to follow-up cardioversion) amiodarone  held January 2026 during admission for cellulitis and C. 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 -LV dysfunction dating back to 2016 - Echo October 12, 2024 LVEF 35-40% with global hypokinesis and severe dilated LV -Appears euvolemic -on metoprolol  tartrate 37.5 mg every 6 hours for rate control -Would hope to transition to metoprolol  succinate prior to discharge -Entresto  spironolactone  and furosemide  on hold due to soft blood pressures and AKI -History of recurrent UTIs so poor candidate for SGLT2 inhibitor therapy   AKI -Serum creatinine  1.89 on arrival now improved to his baseline 0.89 -Entresto , spironolactone , furosemide  remain on hold   Hyperkalemia -Serum potassium normalized 3.7   Hypotension -Blood pressures improved with gentle hydration, midodrine    Dyslipidemia -Continued on statins LFTs improving   Hypothyroidism -TSH normal -Continue levothyroxine    AMS In the setting of COVID-positive - Mentating relatively well today   Signed, Evalene Lunger, MD  11/08/2024, 2:56 PM    "

## 2024-11-08 NOTE — Progress Notes (Signed)
 Physical Therapy Treatment Patient Details Name: Johnathan [name] MRN: [id number] DOB: 03-30-1956 Today's Date: 11/08/2024   History of Present Illness Pt is a 68 y.o. male with medical history significant for osteoarthritis, vitamin B12 deficiency, chronic systolic CHF, Lyme Disease, Essential Hypertension, Hypothyroidism, Peripheral Neuropathy, OSA, Falls and Paroxysmal Atrial Fibrillation, who presented to the ER with acute onset of altered mental status at his SNF. MD assessment includes: AKI, hypocalcemia, metabolic acidosis, and recurrent falls. Pt with transfer to progressive unit d/t fever, hypotension, and tachycardia; cleared for continued participation with PT/OT services.    PT Comments  Pt was pleasant and motivated to participate during the session and put forth good effort throughout. Pt required extra time and effort with bed mobility tasks but no physical assist required.  Pt required general cuing for proper sequencing and multiple rocking attempts during sit to/from stand transfer training but again required no physical assist.  Pt was able to take several small steps laterally left/right at the EOB and then from bed to chair with min A to guide the RW but no overt LOB or buckling noted.  Pt will benefit from continued PT services upon discharge to safely address deficits listed in patient problem list for decreased caregiver assistance and eventual return to PLOF.       If plan is discharge home, recommend the following: Assistance with cooking/housework;Assist for transportation;Help with stairs or ramp for entrance;A little help with bathing/dressing/bathroom;Two people to help with walking and/or transfers   Can travel by private vehicle     No  Equipment Recommendations  Other (comment) (TBD at next venue of care)    Recommendations for Other Services       Precautions / Restrictions Precautions Precautions: Fall Recall of Precautions/Restrictions:  Intact Restrictions Weight Bearing Restrictions Per Provider Order: No     Mobility  Bed Mobility Overal bed mobility: Modified Independent             General bed mobility comments: increased time/effort and use of bed rails during sup to sit    Transfers Overall transfer level: Needs assistance Equipment used: Rolling walker (2 wheels) Transfers: Sit to/from Stand Sit to Stand: From elevated surface, Contact guard assist           General transfer comment: Pt required min verbal cues for hand placement and increased trunk flexion and at times needed multiple rocking attempts to come to standing but required no physical assist    Ambulation/Gait Ambulation/Gait assistance: Min assist Gait Distance (Feet): 6 Feet Assistive device: Rolling walker (2 wheels) Gait Pattern/deviations: Step-to pattern, Decreased step length - right, Decreased step length - left, Trunk flexed Gait velocity: decreased     General Gait Details: Pt able to take several small steps laterally at the EOB and then from bed to chair with min A to guide the RW and cuing for proper sequencing; steps were effortful but no overt LOB or buckling noted   Stairs             Wheelchair Mobility     Tilt Bed    Modified Rankin (Stroke Patients Only)       Balance Overall balance assessment: Needs assistance Sitting-balance support: No upper extremity supported, Feet supported Sitting balance-Leahy Scale: Good     Standing balance support: Bilateral upper extremity supported, During functional activity Standing balance-Leahy Scale: Fair  Communication Communication Communication: No apparent difficulties  Cognition Arousal: Alert Behavior During Therapy: WFL for tasks assessed/performed   PT - Cognitive impairments: No apparent impairments                         Following commands: Intact Following commands impaired: Only  follows one step commands consistently    Cueing Cueing Techniques: Verbal cues, Tactile cues  Exercises Total Joint Exercises Ankle Circles/Pumps: AROM, Strengthening, 5 reps, 10 reps, Both Quad Sets: Strengthening, Both, 5 reps, 10 reps Long Arc Quad: AROM, Strengthening, Both, 10 reps, 15 reps Knee Flexion: AROM, Strengthening, Both, 10 reps, 15 reps Other Exercises: Standing low amplitude mini squats x 3 Other Exercises: HEP education for BLE APs, QS, and LAQs    General Comments        Pertinent Vitals/Pain Pain Assessment Pain Assessment: No/denies pain    Home Living                          Prior Function            PT Goals (current goals can now be found in the care plan section) Progress towards PT goals: Progressing toward goals    Frequency    Min 2X/week      PT Plan      Co-evaluation              AM-PAC PT 6 Clicks Mobility   Outcome Measure  Help needed turning from your back to your side while in a flat bed without using bedrails?: A Little Help needed moving from lying on your back to sitting on the side of a flat bed without using bedrails?: A Little Help needed moving to and from a bed to a chair (including a wheelchair)?: A Little Help needed standing up from a chair using your arms (e.g., wheelchair or bedside chair)?: A Little Help needed to walk in hospital room?: A Lot Help needed climbing 3-5 steps with a railing? : A Lot 6 Click Score: 16    End of Session Equipment Utilized During Treatment: Gait belt Activity Tolerance: Patient tolerated treatment well Patient left: in chair;with call bell/phone within reach;with chair alarm set Nurse Communication: Mobility status PT Visit Diagnosis: Difficulty in walking, not elsewhere classified (R26.2);Muscle weakness (generalized) (M62.81)     Time: 8467-8389 PT Time Calculation (min) (ACUTE ONLY): 38 min  Charges:    $Therapeutic Exercise: 8-22 mins $Therapeutic  Activity: 23-37 mins PT General Charges $$ ACUTE PT VISIT: 1 Visit                     D. Glendia Bertin PT, DPT 11/08/24, 4:22 PM

## 2024-11-08 NOTE — Plan of Care (Signed)
  Problem: Education: Goal: Knowledge of General Education information will improve Description: Including pain rating scale, medication(s)/side effects and non-pharmacologic comfort measures Outcome: Progressing   Problem: Health Behavior/Discharge Planning: Goal: Ability to manage health-related needs will improve Outcome: Progressing   Problem: Clinical Measurements: Goal: Ability to maintain clinical measurements within normal limits will improve Outcome: Progressing Goal: Will remain free from infection Outcome: Progressing Goal: Diagnostic test results will improve Outcome: Progressing Goal: Respiratory complications will improve Outcome: Progressing Goal: Cardiovascular complication will be avoided Outcome: Progressing   Problem: Activity: Goal: Risk for activity intolerance will decrease Outcome: Progressing   Problem: Nutrition: Goal: Adequate nutrition will be maintained Outcome: Progressing   Problem: Coping: Goal: Level of anxiety will decrease Outcome: Progressing   Problem: Elimination: Goal: Will not experience complications related to bowel motility Outcome: Progressing Goal: Will not experience complications related to urinary retention Outcome: Progressing   Problem: Pain Managment: Goal: General experience of comfort will improve and/or be controlled Outcome: Progressing   Problem: Safety: Goal: Ability to remain free from injury will improve Outcome: Progressing   Problem: Skin Integrity: Goal: Risk for impaired skin integrity will decrease Outcome: Progressing   Problem: Education: Goal: Knowledge of risk factors and measures for prevention of condition will improve Outcome: Progressing   Problem: Coping: Goal: Psychosocial and spiritual needs will be supported Outcome: Progressing   Problem: Respiratory: Goal: Will maintain a patent airway Outcome: Progressing Goal: Complications related to the disease process, condition or treatment  will be avoided or minimized Outcome: Progressing

## 2024-11-08 NOTE — Telephone Encounter (Unsigned)
 Copied from CRM [id number]. Topic: General - Other >> Nov 07, 2024  4:58 PM Delon DASEN wrote: Reason for CRM: wife returning Jessica's call

## 2024-11-08 NOTE — Progress Notes (Signed)
 " PROGRESS NOTE    Johnathan [name]  FMW:[id number] DOB: 03-29-1956 DOA: 11/03/2024 PCP: Johnathan Verneita CROME, MD  [id number]  LOS: 4 days   Brief hospital course:   Assessment & Plan: Johnathan [name] is a 69 y.o. Caucasian male with medical history significant for osteoarthritis, vitamin B12 deficiency, chronic systolic CHF, Lyme Disease, Essential Hypertension, Hypothyroidism, Peripheral Neuropathy, OSA, Falls and Paroxysmal Atrial Fibrillation, who presented to the emergency room from his nursing facility (rehabbing at [facility]) with AMS and falling   Wife also notes that pt tested positive for COVID on 15 Jan. Moreover, pt has fallen 6 times in the past week.   Afib RVR into Afib RVR overnight 01/26 --> cardizem  rate improved but then had to dc d/t low BP cardiology following --increase metop, per cardio   Essential HTN, not currently active  Hypotension --increase midodrine   HFrEF - chronic - EF 35-40 Given low BP have held home lasix , entresto , spironolactone   Cardiology following   --increase metop, per cardio   Subtherapeutic INR - improving  Pharmacy to dose coumadin    Dyslipidemia continue statin therapy.   Recurrent falls / Presyncope suspect cardiogenic cause w/ hypotension Question cause d/t dehydration / AKI / polypharmacy / CHF BP low and was on high dose metoprolol  suspect this may be the problem along w/ dehydration and diminished renal perfusion would also explain AKI PT/OT  Sepsis ruled out  Abnormal UA concerning for UTI No urinary symptoms.  Urine cx pos for proteus.   --completed 3 days of ceftriaxone   (+)COVID question residual viral material from recent COVID diagnosed 01/14 per wife he is not complaining of significant respiratory problems other than mild nagging cough, O2 is WNL, CXR no concerns   Recent Cdiff  Recent infection, completed treatment    Leukocytosis  L shift and increasing WBC concern for infectious source - likely UTI / COVID  though also w/ recent CDiff infection Monitor CBC and other sepsis parameters as above   AKI (acute kidney injury)  --resolved --oral hydration   Hyperkalemia Lokelma  x2 doses  monitor   Hypocalcemia acute on chronic  Ca 6.2 on admission 01/24 --> 8.7 on 01/25 which is baseline   Monitor BMP   Metabolic acidosis resolved  Resting tremor upper extremities bilaterally No other concerning neuro findings   Hypothyroidism Continue synthroid     Acute right nasal bone fracture.   Supportive care   Depression --resume Effexor  Continue Wellbutrin  XL.  Class 3 morbid based on BMI: Body mass index is 42.33 kg/m.[name]    DVT prophylaxis: On:Coumadin  Code Status: Full code  Family Communication: wife updated at bedside today Level of care: Progressive Dispo:   The patient is from: SNF rehab Anticipated d/c is to: home (per pt and wife preference) Anticipated d/c date is: 1-2 days   Subjective and Interval History:  Per nursing report, pt was confused last night.   Objective: Vitals:   11/08/24 0818 11/08/24 1000 11/08/24 1200 11/08/24 1509  BP: 118/69 105/87 112/78 110/71  Pulse: (!) 144 (!) 116 (!) 110 (!) 103  Resp:   (!) 25 20  Temp: 97.8 F (36.6 C) 98.1 F (36.7 C) 98.5 F (36.9 C)   TempSrc: Oral Oral Oral   SpO2:  95% 95% 96%  Weight:      Height:        Intake/Output Summary (Last 24 hours) at 11/08/2024 1949 Last data filed at 11/08/2024 0900 Gross per 24 hour  Intake --  Output 650 ml  Net -650 ml   Filed Weights   11/03/24 2105 11/08/24 0500  Weight: (!) 153.6 kg (!) 142.3 kg    Examination:   Constitutional: NAD, sleepy but arousable HEENT: conjunctivae and lids normal, EOMI CV: No cyanosis.   RESP: normal respiratory effort   Data Reviewed: I have personally reviewed labs and imaging studies  Time spent: 35 minutes  Johnathan Haber, MD [facility] If 7PM-7AM, please contact night-coverage 11/08/2024, 7:49 PM   "

## 2024-11-09 DIAGNOSIS — I428 Other cardiomyopathies: Secondary | ICD-10-CM | POA: Diagnosis not present

## 2024-11-09 DIAGNOSIS — I5022 Chronic systolic (congestive) heart failure: Secondary | ICD-10-CM | POA: Diagnosis not present

## 2024-11-09 DIAGNOSIS — I482 Chronic atrial fibrillation, unspecified: Secondary | ICD-10-CM | POA: Diagnosis not present

## 2024-11-09 LAB — CBC
HCT: 37.4 % — ABNORMAL LOW (ref 39.0–52.0)
Hemoglobin: 12.3 g/dL — ABNORMAL LOW (ref 13.0–17.0)
MCH: 29.7 pg (ref 26.0–34.0)
MCHC: 32.9 g/dL (ref 30.0–36.0)
MCV: 90.3 fL (ref 80.0–100.0)
Platelets: 100 10*3/uL — ABNORMAL LOW (ref 150–400)
RBC: 4.14 MIL/uL — ABNORMAL LOW (ref 4.22–5.81)
RDW: 15.3 % (ref 11.5–15.5)
WBC: 13.7 10*3/uL — ABNORMAL HIGH (ref 4.0–10.5)
nRBC: 0 % (ref 0.0–0.2)

## 2024-11-09 LAB — PROTIME-INR
INR: 2.3 — ABNORMAL HIGH (ref 0.8–1.2)
Prothrombin Time: 26 s — ABNORMAL HIGH (ref 11.4–15.2)

## 2024-11-09 MED ORDER — WARFARIN SODIUM 2.5 MG PO TABS
2.5000 mg | ORAL_TABLET | Freq: Once | ORAL | Status: AC
  Administered 2024-11-09: 2.5 mg via ORAL
  Filled 2024-11-09: qty 1

## 2024-11-09 NOTE — Progress Notes (Signed)
 "  Rounding Note   Patient Name: Johnathan [name] Date of Encounter: 11/09/2024  [facility] Cardiologist: Evalene Lunger, MD   Subjective No complaints.  Rate controlled atrial fibrillation.  Scheduled Meds:  buPROPion   300 mg Oral Daily   cyanocobalamin   500 mcg Oral Daily   gabapentin   300 mg Oral BID   levothyroxine   25 mcg Oral Q0600   metoprolol  tartrate  37.5 mg Oral Q6H   midodrine   5 mg Oral TID WC   multivitamin with minerals  1 tablet Oral Daily   rosuvastatin   10 mg Oral q1800   venlafaxine  XR  300 mg Oral Q breakfast   vitamin E   400 Units Oral Daily   Warfarin - Pharmacist Dosing Inpatient   Does not apply q1600   Continuous Infusions:  PRN Meds: acetaminophen  **OR** acetaminophen , oxyCODONE , traZODone    Vital Signs  Vitals:   11/09/24 0353 11/09/24 0800 11/09/24 1200 11/09/24 1620  BP: 121/88 (!) 125/95 100/86 111/88  Pulse: (!) 101 100 89 (!) 118  Resp: 20 (!) 23 (!) 21 19  Temp: 98 F (36.7 C) 98.4 F (36.9 C) 98.2 F (36.8 C) 99 F (37.2 C)  TempSrc:  Oral Oral Oral  SpO2: 96% 96% 95% 95%  Weight:      Height:        Intake/Output Summary (Last 24 hours) at 11/09/2024 1647 Last data filed at 11/09/2024 0358 Gross per 24 hour  Intake --  Output 750 ml  Net -750 ml      11/08/2024    5:00 AM 11/03/2024    9:05 PM 10/15/2024    5:20 AM  Last 3 Weights  Weight (lbs) 313 lb 11.4 oz 338 lb 10 oz 339 lb 1.1 oz  Weight (kg) 142.3 kg 153.6 kg 153.8 kg      Telemetry Atrial fibrillation rate 90s-105 - Personally Reviewed  ECG   - Personally Reviewed  Physical Exam  GEN: No acute distress.   Neck: No JVD Cardiac: Irregular irregular, no murmurs, rubs, or gallops.  Respiratory: Clear to auscultation bilaterally. GI: Soft, nontender, non-distended  MS: No edema; No deformity. Neuro:  Nonfocal  Psych: Normal affect   Labs High Sensitivity Troponin:  No results for input(s): TROPONINIHS in the last 720 hours.  Recent Labs  Lab  11/05/24 0630 11/05/24 0836  TRNPT 50* 50*       Chemistry Recent Labs  Lab 11/03/24 2220 11/04/24 0550 11/06/24 0251 11/07/24 0507 11/08/24 0508  NA 142   < > 140 137 139  K 3.6   < > 5.5* 3.9 3.7  CL 118*   < > 106 105 106  CO2 14*   < > 24 21* 22  GLUCOSE 92   < > 81 95 76  BUN 38*   < > 49* 34* 20  CREATININE 1.66*   < > 1.88* 0.97 0.89  CALCIUM  6.2*   < > 9.0 8.3* 8.1*  MG 1.9  --   --   --  2.2  PROT 4.5*  --  6.4* 6.3* 6.4*  ALBUMIN 2.4*  --  3.3* 3.3* 3.0*  AST 44*  --  51* 41 27  ALT 85*  --  88* 69* 52*  ALKPHOS 80  --  112 269* 116  BILITOT 0.3  --  0.6 0.5 0.5  GFRNONAA 44*   < > 38* >60 >60  ANIONGAP 9   < > 10 11 12    < > = values  in this interval not displayed.    Lipids No results for input(s): CHOL, TRIG, HDL, LABVLDL, LDLCALC, CHOLHDL in the last 168 hours.  Hematology Recent Labs  Lab 11/07/24 0507 11/08/24 0508 11/09/24 0327  WBC 17.1* 12.9* 13.7*  RBC 4.01* 3.95* 4.14*  HGB 12.1* 11.5* 12.3*  HCT 36.2* 36.1* 37.4*  MCV 90.3 91.4 90.3  MCH 30.2 29.1 29.7  MCHC 33.4 31.9 32.9  RDW 15.6* 15.4 15.3  PLT 98* 95* 100*   Thyroid   Recent Labs  Lab 11/04/24 0550  TSH 2.480    BNPNo results for input(s): BNP, PROBNP in the last 168 hours.  DDimer No results for input(s): DDIMER in the last 168 hours.   Radiology  No results found.  Cardiac Studies     Patient Profile   69 y.o. male with a past medical history of persistent atrial fibrillation (diagnosed 2013, persistent since 04/2023), morbid obesity, hypertension, history of DVT (right lower extremity, 2004), OSA not on CPAP, chronic joint pain, chronic HFmrEF/dilated cardiomyopathy, hypothyroidism, chronic venous insufficiency, who has been seen and evaluated for atrial fibrillation with RVR.   Assessment & Plan  Chronic persistent atrial fibrillation with RVR -Prior attempts over the past several years to maintain normal sinus rhythm unsuccessful even on amiodarone   (lost to follow-up cardioversion) amiodarone  held January 2026 during admission for cellulitis and C. difficile - Now with prolonged persistent atrial fibrillation.  Suspect elevated rates due to COVID infection.  Plan: -Continue AC with Coumadin  per pharmacy -Continue metoprolol  37.5 mg 4 times daily; expect rates to settle down as he improves from a COVID standpoint.  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 -LV dysfunction dating back to 2016 - Echo October 12, 2024 LVEF 35-40% with global hypokinesis and severe dilated LV -Appears euvolemic -on metoprolol  tartrate 37.5 mg every 6 hours for rate control; transition to metoprolol  succinate prior to discharge as above -Entresto , spironolactone , and furosemide  on hold due to soft blood pressures and AKI; since his AKI has resolved and his pressures seem to be coming up, can gradually resume these agents in the coming days -History of recurrent UTIs so poor candidate for SGLT2 inhibitor therapy   AKI -Serum creatinine  1.89 on arrival now improved to his baseline 0.89 -Entresto , spironolactone , furosemide  remain on hold; gradually resume as above   Signed, Caron Poser, MD  11/09/2024, 4:47 PM    "

## 2024-11-09 NOTE — Plan of Care (Signed)
  Problem: Education: Goal: Knowledge of General Education information will improve Description: Including pain rating scale, medication(s)/side effects and non-pharmacologic comfort measures Outcome: Progressing   Problem: Health Behavior/Discharge Planning: Goal: Ability to manage health-related needs will improve Outcome: Progressing   Problem: Clinical Measurements: Goal: Ability to maintain clinical measurements within normal limits will improve Outcome: Progressing Goal: Will remain free from infection Outcome: Progressing Goal: Diagnostic test results will improve Outcome: Progressing Goal: Respiratory complications will improve Outcome: Progressing Goal: Cardiovascular complication will be avoided Outcome: Progressing   Problem: Activity: Goal: Risk for activity intolerance will decrease Outcome: Progressing   Problem: Nutrition: Goal: Adequate nutrition will be maintained Outcome: Progressing   Problem: Coping: Goal: Level of anxiety will decrease Outcome: Progressing   Problem: Elimination: Goal: Will not experience complications related to bowel motility Outcome: Progressing Goal: Will not experience complications related to urinary retention Outcome: Progressing   Problem: Pain Managment: Goal: General experience of comfort will improve and/or be controlled Outcome: Progressing   Problem: Safety: Goal: Ability to remain free from injury will improve Outcome: Progressing   Problem: Skin Integrity: Goal: Risk for impaired skin integrity will decrease Outcome: Progressing   Problem: Education: Goal: Knowledge of risk factors and measures for prevention of condition will improve Outcome: Progressing   Problem: Coping: Goal: Psychosocial and spiritual needs will be supported Outcome: Progressing   Problem: Respiratory: Goal: Will maintain a patent airway Outcome: Progressing Goal: Complications related to the disease process, condition or treatment  will be avoided or minimized Outcome: Progressing

## 2024-11-09 NOTE — Consult Note (Addendum)
 PHARMACY - ANTICOAGULATION CONSULT NOTE  Pharmacy Consult for Warfarin Indication: atrial fibrillation  Allergies[1]  Patient Measurements: Height: 6' 3 (190.5 cm) Weight: (!) 142.3 kg (313 lb 11.4 oz) IBW/kg (Calculated) : 84.5 HEPARIN DW (KG): 120  Labs: Recent Labs    11/07/24 0507 11/08/24 0508 11/09/24 0327  HGB 12.1* 11.5* 12.3*  HCT 36.2* 36.1* 37.4*  PLT 98* 95* 100*  LABPROT 26.0* 25.3* 26.0*  INR 2.3* 2.2* 2.3*  CREATININE 0.97 0.89  --     Estimated Creatinine Clearance: 119.2 mL/min (by C-G formula based on SCr of 0.89 mg/dL).   Medical History: Past Medical History:  Diagnosis Date   Arthritis    a. chronic joint pain   B12 deficiency    Chronic systolic CHF (congestive heart failure) (HCC)    a. 2013 Echo: EF of 50-55%, normal right ventricular systolic pressure, normal left atrium; b. 04/2015 Echo: EF 40-45%, inadequate for LV wall motion, mild MR, LA severely dilated @ 52 mm, PASP nl; c. 10/2024 Echo: EF 35-40%, glob HK, mod-sev dil LV. Mild conc LVH. Nl RV fxn. Mildly dil LA. Mild MR. AoV sclerosis w/o stenosis.   Complication of anesthesia    Dysrhythmia    Erythema migrans (Lyme disease) 04/08/2015   History of DVT (deep vein thrombosis) 2004   s/p bariatric surgery   History of stress test    a. there was no ST segment deviation noted during stress,    There is a small defect of moderate severity present in the apex location, suggestive of apical ischemia, intermediate risk, calculated EF 21% but visually appears to be 35-40%   Hypertension    Hypothyroidism    Neuropathy    Obesity    OSA (obstructive sleep apnea)    a. not compliant with CPAP   PAF (paroxysmal atrial fibrillation) (HCC)    a. CHADSVASc at least 2 (HTN and vascular disease); b. on Eliquis     Pre-diabetes    Medications:  Warfarin 2.5 mg daily prior to admission with last dose 11/03/24 at 0900. This was a new discharge dose on 10/16/24. TWD = 17.5 mg   Regimen prior to 10/11/24  - 10/16/24 admission was 7.5 mg on MoFr and 5 mg EOD = TWD 40 mg  Assessment: 69 y/o M presenting from SNF with fall and AMS. Upon admission, found with AKI. PMH significant for osteoarthritis, vitamin B12 deficiency, chronic systolic CHF, lyme disease, essential hypertension, hypothyroidism, peripheral neuropathy, OSA, falls and paroxysmal atrial fibrillation. Patient also has recent history of COVID (tested positive on 1/16). Currently on warfarin for Afib with CHADSVASc 3 (age, CHF, HTN). Pt has allergies listed to apixaban  and rivaorxaban and cannot afford dabigatran . INR was subtherapeutic on admission with new dose of warfarin 2.5 mg daily, but unsure if pt was compliant. Pharmacy has been consulted for warfarin dosing and monitoring.  DDI: ceftriaxone  (increase INR, but was stopped), amio (increase INR, but was stopped), trazodone  (increase risk of bleeding), venlafaxine  (increase risk of bleeding), levothyroxine  (increase response to warfarin), rosuvastatin  (increase INR), acetaminophen  (increase risk of bleeding).   Date INR Warfarin Dose  1/25 1.7 4 mg  1/26 2.1 2.5 mg   1/27 2.3 2.5 mg  1/28 2.3 2.5 mg   1/29 2.2 2.5 mg   1/30 2.3 2.5 mg     Goal of Therapy:  INR 2-3   Plan:  -- INR is within therapeutic range -- Will order Warfarin 2.5 mg PO x 1, which is patient's normal home dose --  PLTs were downtrending, now recovering (104 >> 98 >> 95 >> 100). Hgb is also returning to baseline (12.7 >> 12.1 >> 11.5 >> 12.3).  -- AST (44 >> 51 >> 41 >> 27) and ALT (85 >> 88 >> 69 >> 52) elevated  upon admission but now trending down. -- Per RN, no signs of external bleeding. Urine also yellow and clear. Will continue to monitor  -- Daily INR and CBC at least every 3 days while admitted -- Patient is currently therapeutic on the current regimen, and pharmacy will continue to monitor throughout the hospital stay.    Nevaeh Amenah Tucci PY-2 PharmD Candidate  [facility]  11/09/2024  10:02 AM         [1]  Allergies Allergen Reactions   Eliquis  [Apixaban ] Itching    Red spots and severe itching   Xarelto [Rivaroxaban] Hives    Reaction in 2013

## 2024-11-09 NOTE — Progress Notes (Signed)
 " PROGRESS NOTE    Johnathan [name]  FMW:[id number] DOB: [birth year] DOA: 11/03/2024 PCP: Marylynn Verneita CROME, MD  [id number]  LOS: 5 days   Brief hospital course:   Assessment & Plan: Johnathan [name] is a 69 y.o. Caucasian male with medical history significant for osteoarthritis, vitamin B12 deficiency, chronic systolic CHF, Lyme Disease, Essential Hypertension, Hypothyroidism, Peripheral Neuropathy, OSA, Falls and Paroxysmal Atrial Fibrillation, who presented to the emergency room from his nursing facility (rehabbing at [facility]) with AMS and falling   Wife also notes that pt tested positive for COVID on 15 Jan. Moreover, pt has fallen 6 times in the past week.   Afib RVR into Afib RVR overnight 01/26 --> cardizem  rate improved but then had to dc d/t low BP cardiology following --cont Lopressor  37.5 mg QID --cont warfarin per pharm   Essential HTN, not currently active  Hypotension --cont midodrine   HFrEF - chronic - EF 35-40 Given low BP have held home lasix , entresto , spironolactone   Cardiology following   --cont Lopressor  37.5 mg QID   Subtherapeutic INR - improving  Pharmacy to dose coumadin    Dyslipidemia continue statin therapy.   Recurrent falls / Presyncope suspect cardiogenic cause w/ hypotension Question cause d/t dehydration / AKI / polypharmacy / CHF BP low and was on high dose metoprolol  suspect this may be the problem along w/ dehydration and diminished renal perfusion would also explain AKI PT/OT  Sepsis ruled out  Abnormal UA concerning for UTI No urinary symptoms.  Urine cx pos for proteus.   --completed 3 days of ceftriaxone   (+)COVID question residual viral material from recent COVID diagnosed 01/14 per wife he is not complaining of significant respiratory problems other than mild nagging cough, O2 is WNL, CXR no concerns   Recent Cdiff  Recent infection, completed treatment    Leukocytosis  L shift and increasing WBC concern for infectious  source - likely UTI / COVID though also w/ recent CDiff infection Monitor CBC and other sepsis parameters as above   AKI (acute kidney injury)  --resolved --oral hydration   Hyperkalemia Lokelma  x2 doses  monitor   Hypocalcemia acute on chronic  Ca 6.2 on admission 01/24 --> 8.7 on 01/25 which is baseline   Monitor BMP   Metabolic acidosis resolved  Resting tremor upper extremities bilaterally No other concerning neuro findings   Hypothyroidism Continue synthroid     Acute right nasal bone fracture.   Supportive care   Depression --cont Effexor  andWellbutrin  Class 3 morbid based on BMI: Body mass index is 42.33 kg/m.[name]    DVT prophylaxis: On:Coumadin  Code Status: Full code  Family Communication:  Level of care: Progressive Dispo:   The patient is from: SNF rehab Anticipated d/c is to: home (per pt and wife preference) Anticipated d/c date is: 1-2 days   Subjective and Interval History:  Pt was alert and coherent during the day.  Reported feeling good.   Objective: Vitals:   11/09/24 0353 11/09/24 0800 11/09/24 1200 11/09/24 1620  BP: 121/88 (!) 125/95 100/86 111/88  Pulse: (!) 101 100 89 (!) 118  Resp: 20 (!) 23 (!) 21 19  Temp: 98 F (36.7 C) 98.4 F (36.9 C) 98.2 F (36.8 C) 99 F (37.2 C)  TempSrc:  Oral Oral Oral  SpO2: 96% 96% 95% 95%  Weight:      Height:        Intake/Output Summary (Last 24 hours) at 11/09/2024 2026 Last data filed at 11/09/2024 1757 Gross per 24 hour  Intake --  Output 1150 ml  Net -1150 ml   Filed Weights   11/03/24 2105 11/08/24 0500  Weight: (!) 153.6 kg (!) 142.3 kg    Examination:   Constitutional: NAD, alert, oriented to person and place HEENT: conjunctivae and lids normal, EOMI CV: No cyanosis.   RESP: normal respiratory effort, on RA Neuro: II - XII grossly intact.   Psych: Normal mood and affect.     Data Reviewed: I have personally reviewed labs and imaging studies  Time spent: 35 minutes  Ellouise Haber, MD [facility] If 7PM-7AM, please contact night-coverage 11/09/2024, 8:26 PM   "

## 2024-11-09 NOTE — TOC Progression Note (Addendum)
 Transition of Care [facility]) - Progression Note    Patient Details  Name: Johnathan [name] MRN: [id number] Date of Birth: 06/27/1956  Transition of Care [facility]) CM/SW Contact  Lauraine JAYSON Carpen, LCSW Phone Number: 11/09/2024, 12:28 PM  Clinical Narrative:   CSW spoke to wife over the phone. She is agreeable to a BSC. CSW sent secure chat to MD to see if patient will be here over the weekend or not.  12:37 pm: Potential discharge tomorrow. CSW ordered [facility] through [employer].  Expected Discharge Plan: Home w Home Health Services Barriers to Discharge: Continued Medical Work up               Expected Discharge Plan and Services   Discharge Planning Services: CM Consult                                           Social Drivers of Health (SDOH) Interventions SDOH Screenings   Food Insecurity: No Food Insecurity (11/04/2024)  Housing: Low Risk (11/04/2024)  Transportation Needs: No Transportation Needs (11/04/2024)  Utilities: Not At Risk (11/04/2024)  Alcohol Screen: Low Risk (09/04/2024)  Depression (PHQ2-9): Medium Risk (10/09/2024)  Financial Resource Strain: Low Risk (09/04/2024)  Physical Activity: Insufficiently Active (09/04/2024)  Social Connections: Moderately Integrated (11/04/2024)  Recent Concern: Social Connections - Moderately Isolated (10/11/2024)  Stress: No Stress Concern Present (09/04/2024)  Tobacco Use: Low Risk (11/03/2024)  Health Literacy: Adequate Health Literacy (09/04/2024)    Readmission Risk Interventions    11/05/2024    2:45 PM  Readmission Risk Prevention Plan  Transportation Screening Complete  PCP or Specialist Appt within 5-7 Days --  Home Care Screening Complete  Medication Review (RN CM) Complete

## 2024-11-09 NOTE — TOC CM/SW Note (Signed)
 Patient is not able to walk the distance required to go the bathroom, or he/she is unable to safely negotiate stairs required to access the bathroom.  A 3in1 BSC will alleviate this problem

## 2024-11-09 NOTE — Progress Notes (Signed)
 Mobility Specialist - Progress Note    11/09/24 1500  Mobility  Activity Stood with assistance  Level of Assistance Minimal assist, patient does 75% or more  Assistive Device Front wheel walker;BSC  Distance Ambulated (ft) 4 ft  Range of Motion/Exercises All extremities  Activity Response Tolerated well  Mobility visit 1 Mobility  Mobility Specialist Start Time (ACUTE ONLY) 1215  Mobility Specialist Stop Time (ACUTE ONLY) 1242  Mobility Specialist Time Calculation (min) (ACUTE ONLY) 27 min   Pt was supine in bed with the HOB elevated on RA with guest in the room upon entry. Pt agreed to mobility. Pt is able today to get to the EOB with minA. Pt was able today to STS with modA CGA for safety at the EOB. Pt was able to complete this activity twice and stand for 1 min twice. Pt vitals were recorded throughout activity as a precaution. After activity pt request to pivot to the [facility]. Pt was on the [facility] with guest in the room. Care team was informed during exit.  Clem Rodes Mobility Specialist 11/09/24, 3:51 PM

## 2024-11-10 DIAGNOSIS — I428 Other cardiomyopathies: Secondary | ICD-10-CM | POA: Diagnosis not present

## 2024-11-10 DIAGNOSIS — I482 Chronic atrial fibrillation, unspecified: Secondary | ICD-10-CM | POA: Diagnosis not present

## 2024-11-10 DIAGNOSIS — I5022 Chronic systolic (congestive) heart failure: Secondary | ICD-10-CM | POA: Diagnosis not present

## 2024-11-10 LAB — CULTURE, BLOOD (ROUTINE X 2)
Culture: NO GROWTH
Culture: NO GROWTH
Special Requests: ADEQUATE
Special Requests: ADEQUATE

## 2024-11-10 LAB — PROTIME-INR
INR: 2.4 — ABNORMAL HIGH (ref 0.8–1.2)
Prothrombin Time: 27.2 s — ABNORMAL HIGH (ref 11.4–15.2)

## 2024-11-10 MED ORDER — WARFARIN SODIUM 2.5 MG PO TABS
2.5000 mg | ORAL_TABLET | Freq: Once | ORAL | Status: AC
  Administered 2024-11-10: 2.5 mg via ORAL
  Filled 2024-11-10: qty 1

## 2024-11-10 MED ORDER — METOPROLOL SUCCINATE ER 100 MG PO TB24
100.0000 mg | ORAL_TABLET | Freq: Every day | ORAL | Status: DC
  Administered 2024-11-10 – 2024-11-11 (×2): 100 mg via ORAL
  Filled 2024-11-10 (×2): qty 1

## 2024-11-10 NOTE — Progress Notes (Signed)
" °   11/10/24 1318  Neurological  Neuro (WDL) X  Orientation Level Oriented X4  Cognition Appropriate at baseline;Follows commands  Speech Clear  ECG Monitoring  ECG Heart Rate (!) 114  Musculoskeletal  Musculoskeletal (WDL) WDL  Assistive Device Front wheel walker  Generalized Weakness Yes  Integumentary  Integumentary (WDL) WDL    "

## 2024-11-10 NOTE — Consult Note (Signed)
 PHARMACY - ANTICOAGULATION CONSULT NOTE  Pharmacy Consult for Warfarin Indication: atrial fibrillation  Allergies[1]  Patient Measurements: Height: 6' 3 (190.5 cm) Weight: (!) 142.3 kg (313 lb 11.4 oz) IBW/kg (Calculated) : 84.5 HEPARIN DW (KG): 120  Labs: Recent Labs    11/08/24 0508 11/09/24 0327 11/10/24 0435  HGB 11.5* 12.3*  --   HCT 36.1* 37.4*  --   PLT 95* 100*  --   LABPROT 25.3* 26.0* 27.2*  INR 2.2* 2.3* 2.4*  CREATININE 0.89  --   --     Estimated Creatinine Clearance: 119.2 mL/min (by C-G formula based on SCr of 0.89 mg/dL).   Medical History: Past Medical History:  Diagnosis Date   Arthritis    a. chronic joint pain   B12 deficiency    Chronic systolic CHF (congestive heart failure) (HCC)    a. 2013 Echo: EF of 50-55%, normal right ventricular systolic pressure, normal left atrium; b. 04/2015 Echo: EF 40-45%, inadequate for LV wall motion, mild MR, LA severely dilated @ 52 mm, PASP nl; c. 10/2024 Echo: EF 35-40%, glob HK, mod-sev dil LV. Mild conc LVH. Nl RV fxn. Mildly dil LA. Mild MR. AoV sclerosis w/o stenosis.   Complication of anesthesia    Dysrhythmia    Erythema migrans (Lyme disease) 04/08/2015   History of DVT (deep vein thrombosis) 2004   s/p bariatric surgery   History of stress test    a. there was no ST segment deviation noted during stress,    There is a small defect of moderate severity present in the apex location, suggestive of apical ischemia, intermediate risk, calculated EF 21% but visually appears to be 35-40%   Hypertension    Hypothyroidism    Neuropathy    Obesity    OSA (obstructive sleep apnea)    a. not compliant with CPAP   PAF (paroxysmal atrial fibrillation) (HCC)    a. CHADSVASc at least 2 (HTN and vascular disease); b. on Eliquis     Pre-diabetes    Medications:  Warfarin 2.5 mg daily prior to admission with last dose 11/03/24 at 0900. This was a new discharge dose on 10/16/24. TWD = 17.5 mg   Regimen prior to 10/11/24  - 10/16/24 admission was 7.5 mg on MoFr and 5 mg EOD = TWD 40 mg  Assessment: 69 y/o M presenting from SNF with fall and AMS. Upon admission, found with AKI. PMH significant for osteoarthritis, vitamin B12 deficiency, chronic systolic CHF, lyme disease, essential hypertension, hypothyroidism, peripheral neuropathy, OSA, falls and paroxysmal atrial fibrillation. Patient also has recent history of COVID (tested positive on 1/16). Currently on warfarin for Afib with CHADSVASc 3 (age, CHF, HTN). Pt has allergies listed to apixaban  and rivaorxaban and cannot afford dabigatran . INR was subtherapeutic on admission with new dose of warfarin 2.5 mg daily, but unsure if pt was compliant. Pharmacy has been consulted for warfarin dosing and monitoring.  DDI: ceftriaxone  (increase INR, but was stopped), amio (increase INR, but was stopped), trazodone  (increase risk of bleeding), venlafaxine  (increase risk of bleeding), levothyroxine  (increase response to warfarin), rosuvastatin  (increase INR), acetaminophen  (increase risk of bleeding).   Date INR Warfarin Dose  1/25 1.7 4 mg  1/26 2.1 2.5 mg   1/27 2.3 2.5 mg  1/28 2.3 2.5 mg   1/29 2.2 2.5 mg   1/30 2.3 2.5 mg  1/31 2.4 2.5 mg     Goal of Therapy:  INR 2-3   Plan:  -- INR is within therapeutic range -- Will  order Warfarin 2.5 mg PO x 1, which is patient's normal home dose -- PLTs were downtrending, now recovering (104 >> 98 >> 95 >> 100). Hgb is also returning to baseline (12.7 >> 12.1 >> 11.5 >> 12.3).  -- AST (44 >> 51 >> 41 >> 27) and ALT (85 >> 88 >> 69 >> 52) elevated  upon admission but now trending down. -- Per RN, no signs of external bleeding. Urine also yellow and clear. Will continue to monitor  -- Daily INR and CBC at least every 3 days while admitted -- Patient is currently therapeutic on the current regimen, and pharmacy will continue to monitor throughout the hospital stay.    Adriana Bolster, PharmD, BCPS 11/10/2024 12:24  PM          [1]  Allergies Allergen Reactions   Eliquis  [Apixaban ] Itching    Red spots and severe itching   Xarelto [Rivaroxaban] Hives    Reaction in 2013

## 2024-11-10 NOTE — Progress Notes (Signed)
 "  Rounding Note   Patient Name: Johnathan [name] Date of Encounter: 11/10/2024  [facility] Cardiologist: Evalene Lunger, MD   Subjective No complaints.  Rate controlled atrial fibrillation on telemetry.  He feels like he would like to go home soon.  Scheduled Meds:  buPROPion   300 mg Oral Daily   cyanocobalamin   500 mcg Oral Daily   gabapentin   300 mg Oral BID   levothyroxine   25 mcg Oral Q0600   metoprolol  tartrate  37.5 mg Oral Q6H   multivitamin with minerals  1 tablet Oral Daily   rosuvastatin   10 mg Oral q1800   venlafaxine  XR  300 mg Oral Q breakfast   vitamin E   400 Units Oral Daily   Warfarin - Pharmacist Dosing Inpatient   Does not apply q1600   Continuous Infusions:  PRN Meds: acetaminophen  **OR** acetaminophen , oxyCODONE , traZODone    Vital Signs  Vitals:   11/09/24 1620 11/09/24 2119 11/10/24 0416 11/10/24 0903  BP: 111/88   135/84  Pulse: (!) 118     Resp: 19     Temp: 99 F (37.2 C) 98.1 F (36.7 C) 98.5 F (36.9 C) 98.8 F (37.1 C)  TempSrc: Oral  Oral Oral  SpO2: 95%     Weight:      Height:        Intake/Output Summary (Last 24 hours) at 11/10/2024 0923 Last data filed at 11/10/2024 0417 Gross per 24 hour  Intake --  Output 850 ml  Net -850 ml      11/08/2024    5:00 AM 11/03/2024    9:05 PM 10/15/2024    5:20 AM  Last 3 Weights  Weight (lbs) 313 lb 11.4 oz 338 lb 10 oz 339 lb 1.1 oz  Weight (kg) 142.3 kg 153.6 kg 153.8 kg      Telemetry Atrial fibrillation rate 90s-105 - Personally Reviewed  ECG   - Personally Reviewed  Physical Exam  GEN: No acute distress.   Neck: No JVD Cardiac: Irregular irregular, no murmurs, rubs, or gallops.  Respiratory: Clear to auscultation bilaterally. GI: Soft, nontender, non-distended  MS: No edema; No deformity. Neuro:  Nonfocal  Psych: Normal affect   Labs High Sensitivity Troponin:  No results for input(s): TROPONINIHS in the last 720 hours.  Recent Labs  Lab 11/05/24 0630  11/05/24 0836  TRNPT 50* 50*       Chemistry Recent Labs  Lab 11/03/24 2220 11/04/24 0550 11/06/24 0251 11/07/24 0507 11/08/24 0508  NA 142   < > 140 137 139  K 3.6   < > 5.5* 3.9 3.7  CL 118*   < > 106 105 106  CO2 14*   < > 24 21* 22  GLUCOSE 92   < > 81 95 76  BUN 38*   < > 49* 34* 20  CREATININE 1.66*   < > 1.88* 0.97 0.89  CALCIUM  6.2*   < > 9.0 8.3* 8.1*  MG 1.9  --   --   --  2.2  PROT 4.5*  --  6.4* 6.3* 6.4*  ALBUMIN 2.4*  --  3.3* 3.3* 3.0*  AST 44*  --  51* 41 27  ALT 85*  --  88* 69* 52*  ALKPHOS 80  --  112 269* 116  BILITOT 0.3  --  0.6 0.5 0.5  GFRNONAA 44*   < > 38* >60 >60  ANIONGAP 9   < > 10 11 12    < > = values  in this interval not displayed.    Lipids No results for input(s): CHOL, TRIG, HDL, LABVLDL, LDLCALC, CHOLHDL in the last 168 hours.  Hematology Recent Labs  Lab 11/07/24 0507 11/08/24 0508 11/09/24 0327  WBC 17.1* 12.9* 13.7*  RBC 4.01* 3.95* 4.14*  HGB 12.1* 11.5* 12.3*  HCT 36.2* 36.1* 37.4*  MCV 90.3 91.4 90.3  MCH 30.2 29.1 29.7  MCHC 33.4 31.9 32.9  RDW 15.6* 15.4 15.3  PLT 98* 95* 100*   Thyroid   Recent Labs  Lab 11/04/24 0550  TSH 2.480    BNPNo results for input(s): BNP, PROBNP in the last 168 hours.  DDimer No results for input(s): DDIMER in the last 168 hours.   Radiology  No results found.  Cardiac Studies     Patient Profile   69 y.o. male with a past medical history of persistent atrial fibrillation (diagnosed 2013, persistent since 04/2023), morbid obesity, hypertension, history of DVT (right lower extremity, 2004), OSA not on CPAP, chronic joint pain, chronic HFmrEF/dilated cardiomyopathy, hypothyroidism, chronic venous insufficiency, who has been seen and evaluated for atrial fibrillation with RVR.   Assessment & Plan  Chronic persistent atrial fibrillation with RVR -Prior attempts over the past several years to maintain normal sinus rhythm unsuccessful even on amiodarone  (lost to  follow-up cardioversion) amiodarone  held January 2026 during admission for cellulitis and C. difficile - Now with prolonged persistent atrial fibrillation.  Suspect elevated rates due to COVID infection.  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  f he maintains his pressures, then we will add on his previous heart failure medications.  Do not want to add GDMT while we are supporting his blood pressure with midodrine . -History of recurrent UTIs so poor candidate for SGLT2 inhibitor therapy   AKI -Serum creatinine  1.89 on arrival now improved to his baseline 0.89 -Entresto , spironolactone , furosemide  remain on hold; gradually resume as above once off of midodrine    Signed, Caron Poser, MD  11/10/2024, 9:23 AM    "

## 2024-11-10 NOTE — Plan of Care (Signed)

## 2024-11-10 NOTE — Progress Notes (Signed)
 " PROGRESS NOTE    Johnathan [name]  FMW:[id number] DOB: 12/04/1955 DOA: 11/03/2024 PCP: Marylynn Verneita CROME, MD  [id number]  LOS: 6 days   Brief hospital course:   Assessment & Plan: Johnathan [name] is a 69 y.o. Caucasian male with medical history significant for osteoarthritis, vitamin B12 deficiency, chronic systolic CHF, Lyme Disease, Essential Hypertension, Hypothyroidism, Peripheral Neuropathy, OSA, Falls and Paroxysmal Atrial Fibrillation, who presented to the emergency room from his nursing facility (rehabbing at [facility]) with AMS and falling   Wife also notes that pt tested positive for COVID on 15 Jan. Moreover, pt has fallen 6 times in the past week.   Afib RVR into Afib RVR overnight 01/26 --> cardizem  rate improved but then had to dc d/t low BP cardiology following --consolidate metop to Toprol  100 mg daily --cont warfarin per pharm   Essential HTN, not currently active  Hypotension --d/c midodrine , per cardio  HFrEF - chronic - EF 35-40 Given low BP have held home lasix , entresto , spironolactone   Cardiology following   --consolidate metop to Toprol  100 mg daily   Subtherapeutic INR - improving  Pharmacy to dose coumadin    Dyslipidemia continue statin therapy.   Recurrent falls / Presyncope suspect cardiogenic cause w/ hypotension Question cause d/t dehydration / AKI / polypharmacy / CHF BP low and was on high dose metoprolol  suspect this may be the problem along w/ dehydration and diminished renal perfusion would also explain AKI --PT/OT  Sepsis ruled out  Abnormal UA concerning for UTI No urinary symptoms.  Urine cx pos for proteus.   --completed 3 days of ceftriaxone   (+)COVID question residual viral material from recent COVID diagnosed 01/14 per wife he is not complaining of significant respiratory problems other than mild nagging cough, O2 is WNL, CXR no concerns   Recent Cdiff  Recent infection, completed treatment    Leukocytosis  L shift and  increasing WBC concern for infectious source - likely UTI / COVID though also w/ recent CDiff infection Monitor CBC and other sepsis parameters as above   AKI (acute kidney injury)  --resolved --oral hydration   Hyperkalemia Lokelma  x2 doses  monitor   Hypocalcemia acute on chronic  Ca 6.2 on admission 01/24 --> 8.7 on 01/25 which is baseline   Monitor BMP   Metabolic acidosis resolved  Resting tremor upper extremities bilaterally No other concerning neuro findings   Hypothyroidism Continue synthroid     Acute right nasal bone fracture.   Supportive care   Depression --cont Effexor  andWellbutrin  Class 3 morbid based on BMI: Body mass index is 42.33 kg/m.[name]    DVT prophylaxis: On:Coumadin  Code Status: Full code  Family Communication:  Level of care: Progressive Dispo:   The patient is from: SNF rehab Anticipated d/c is to: home (per pt and wife preference) Anticipated d/c date is: whenever family can come pick up pt (due to weather)   Subjective and Interval History:  No complaint today.     Objective: Vitals:   11/10/24 1200 11/10/24 1318 11/10/24 1353 11/10/24 1553  BP:  103/78 108/86 (!) 128/99  Pulse:  (!) 112 (!) 102   Resp:  (!) 23 20   Temp: 98.1 F (36.7 C)  98.2 F (36.8 C)   TempSrc: Oral  Oral   SpO2:  95% 95%   Weight:      Height:        Intake/Output Summary (Last 24 hours) at 11/10/2024 1806 Last data filed at 11/10/2024 0417 Gross per 24 hour  Intake --  Output 450 ml  Net -450 ml   Filed Weights   11/03/24 2105 11/08/24 0500  Weight: (!) 153.6 kg (!) 142.3 kg    Examination:   Constitutional: NAD, alert, oriented to person and place HEENT: conjunctivae and lids normal, EOMI CV: No cyanosis.   RESP: normal respiratory effort, on RA   Data Reviewed: I have personally reviewed labs and imaging studies  Time spent: 35 minutes  Ellouise Haber, MD [facility] If 7PM-7AM, please contact night-coverage 11/10/2024, 6:06 PM    "

## 2024-11-11 DIAGNOSIS — I428 Other cardiomyopathies: Secondary | ICD-10-CM | POA: Diagnosis not present

## 2024-11-11 DIAGNOSIS — I5022 Chronic systolic (congestive) heart failure: Secondary | ICD-10-CM | POA: Diagnosis not present

## 2024-11-11 DIAGNOSIS — I482 Chronic atrial fibrillation, unspecified: Secondary | ICD-10-CM | POA: Diagnosis not present

## 2024-11-11 LAB — PROTIME-INR
INR: 2.4 — ABNORMAL HIGH (ref 0.8–1.2)
Prothrombin Time: 27.4 s — ABNORMAL HIGH (ref 11.4–15.2)

## 2024-11-11 MED ORDER — FUROSEMIDE 20 MG PO TABS
20.0000 mg | ORAL_TABLET | Freq: Every day | ORAL | Status: DC
  Administered 2024-11-11: 20 mg via ORAL
  Filled 2024-11-11: qty 1

## 2024-11-11 MED ORDER — WARFARIN SODIUM 2.5 MG PO TABS
2.5000 mg | ORAL_TABLET | Freq: Once | ORAL | Status: DC
  Filled 2024-11-11: qty 1

## 2024-11-11 MED ORDER — METOPROLOL SUCCINATE ER 100 MG PO TB24: 100.0000 mg | ORAL_TABLET | Freq: Every day | ORAL | Status: AC

## 2024-11-11 MED ORDER — SPIRONOLACTONE 25 MG PO TABS
25.0000 mg | ORAL_TABLET | Freq: Every day | ORAL | Status: DC
  Administered 2024-11-11: 25 mg via ORAL
  Filled 2024-11-11: qty 1

## 2024-11-11 NOTE — TOC Transition Note (Signed)
 Transition of Care [facility]) - Discharge Note   Patient Details  Name: Johnathan [name] MRN: [id number] Date of Birth: June 16, 1956  Transition of Care [facility] CM/SW Contact:  Victory Jackquline RAMAN, RN Phone Number: 11/11/2024, 3:24 PM   Clinical Narrative:    RNCM received a message from there MD informing me that the patient is medically stable for discharge and needs a ride home. RNCM called the patient at [phone], introduced myself and my role and explained that discharge planning would be discussed. Informed him that the MD was discharging him today and wanted to know if he was okay with going home in an [place]. He said that he would call his wife and get back to me. RNCM received a message to call the patients wife. RNCM called patient's wife Comer @ [phone], she said that she was very upset about him coming home with an [place] and her  driveway isn't clear and doesn't know how he's going to get in the house.She is saying absolutely no SNF/STR. She has HH coming into assist her with her husband. She is okay with him coming home with the [employer]. MD and bedside nurse made aware. Pt has discharge orders, no further concerns. RNCM signing off.   Final next level of care: Home/Self Care Barriers to Discharge: Barriers Resolved   Patient Goals and CMS Choice            Discharge Placement                Patient to be transferred to facility by: [facility] Name of family member notified: Comer Patient and family notified of of transfer: 11/11/24  Discharge Plan and Services Additional resources added to the After Visit Summary for     Discharge Planning Services: CM Consult                                 Social Drivers of Health (SDOH) Interventions SDOH Screenings   Food Insecurity: No Food Insecurity (11/04/2024)  Housing: Low Risk (11/04/2024)  Transportation Needs: No Transportation Needs (11/04/2024)  Utilities: Not At Risk (11/04/2024)  Alcohol  Screen: Low Risk (09/04/2024)  Depression (PHQ2-9): Medium Risk (10/09/2024)  Financial Resource Strain: Low Risk (09/04/2024)  Physical Activity: Insufficiently Active (09/04/2024)  Social Connections: Moderately Integrated (11/04/2024)  Recent Concern: Social Connections - Moderately Isolated (10/11/2024)  Stress: No Stress Concern Present (09/04/2024)  Tobacco Use: Low Risk (11/03/2024)  Health Literacy: Adequate Health Literacy (09/04/2024)     Readmission Risk Interventions    11/05/2024    2:45 PM  Readmission Risk Prevention Plan  Transportation Screening Complete  PCP or Specialist Appt within 5-7 Days --  Home Care Screening Complete  Medication Review (RN CM) Complete

## 2024-11-11 NOTE — Progress Notes (Addendum)
 "  Rounding Note   Patient Name: Johnathan [name] Date of Encounter: 11/11/2024  [facility] Cardiologist: Evalene Lunger, MD   Subjective No notable events overnight.  No more hypotension off of midodrine .  Rate controlled.  Scheduled Meds:  buPROPion   300 mg Oral Daily   cyanocobalamin   500 mcg Oral Daily   gabapentin   300 mg Oral BID   levothyroxine   25 mcg Oral Q0600   metoprolol  succinate  100 mg Oral Daily   multivitamin with minerals  1 tablet Oral Daily   rosuvastatin   10 mg Oral q1800   venlafaxine  XR  300 mg Oral Q breakfast   vitamin E   400 Units Oral Daily   warfarin  2.5 mg Oral ONCE-1600   Warfarin - Pharmacist Dosing Inpatient   Does not apply q1600   Continuous Infusions:  PRN Meds: acetaminophen  **OR** acetaminophen , oxyCODONE , traZODone    Vital Signs  Vitals:   11/10/24 1318 11/10/24 1353 11/10/24 1553 11/10/24 1800  BP: 103/78 108/86 (!) 128/99   Pulse: (!) 112 (!) 102    Resp: (!) 23 20    Temp:  98.2 F (36.8 C)  (!) 97.4 F (36.3 C)  TempSrc:  Oral  Oral  SpO2: 95% 95%    Weight:      Height:        Intake/Output Summary (Last 24 hours) at 11/11/2024 0816 Last data filed at 11/10/2024 1857 Gross per 24 hour  Intake --  Output 650 ml  Net -650 ml      11/08/2024    5:00 AM 11/03/2024    9:05 PM 10/15/2024    5:20 AM  Last 3 Weights  Weight (lbs) 313 lb 11.4 oz 338 lb 10 oz 339 lb 1.1 oz  Weight (kg) 142.3 kg 153.6 kg 153.8 kg      Telemetry Atrial fibrillation rate 90s-105 - Personally Reviewed  ECG   - Personally Reviewed  Physical Exam  GEN: No acute distress.   Neck: No JVD Cardiac: Irregular irregular, no murmurs, rubs, or gallops.  Respiratory: Clear to auscultation bilaterally. GI: Soft, nontender, non-distended  MS: No edema; No deformity. Neuro:  Nonfocal  Psych: Normal affect   Labs High Sensitivity Troponin:  No results for input(s): TROPONINIHS in the last 720 hours.  Recent Labs  Lab 11/05/24 0630  11/05/24 0836  TRNPT 50* 50*       Chemistry Recent Labs  Lab 11/06/24 0251 11/07/24 0507 11/08/24 0508  NA 140 137 139  K 5.5* 3.9 3.7  CL 106 105 106  CO2 24 21* 22  GLUCOSE 81 95 76  BUN 49* 34* 20  CREATININE 1.88* 0.97 0.89  CALCIUM  9.0 8.3* 8.1*  MG  --   --  2.2  PROT 6.4* 6.3* 6.4*  ALBUMIN 3.3* 3.3* 3.0*  AST 51* 41 27  ALT 88* 69* 52*  ALKPHOS 112 269* 116  BILITOT 0.6 0.5 0.5  GFRNONAA 38* >60 >60  ANIONGAP 10 11 12     Lipids No results for input(s): CHOL, TRIG, HDL, LABVLDL, LDLCALC, CHOLHDL in the last 168 hours.  Hematology Recent Labs  Lab 11/07/24 0507 11/08/24 0508 11/09/24 0327  WBC 17.1* 12.9* 13.7*  RBC 4.01* 3.95* 4.14*  HGB 12.1* 11.5* 12.3*  HCT 36.2* 36.1* 37.4*  MCV 90.3 91.4 90.3  MCH 30.2 29.1 29.7  MCHC 33.4 31.9 32.9  RDW 15.6* 15.4 15.3  PLT 98* 95* 100*   Thyroid   No results for input(s): TSH, FREET4 in the  last 168 hours.   BNPNo results for input(s): BNP, PROBNP in the last 168 hours.  DDimer No results for input(s): DDIMER in the last 168 hours.   Radiology  No results found.  Cardiac Studies     Patient Profile   69 y.o. male with a past medical history of persistent atrial fibrillation (diagnosed 2013, persistent since 04/2023), morbid obesity, hypertension, history of DVT (right lower extremity, 2004), OSA not on CPAP, chronic joint pain, chronic HFmrEF/dilated cardiomyopathy, hypothyroidism, chronic venous insufficiency, who has been seen and evaluated for atrial fibrillation with RVR.   Assessment & Plan  Chronic persistent atrial fibrillation with RVR -Prior attempts over the past several years to maintain normal sinus rhythm unsuccessful even on amiodarone  (lost to follow-up cardioversion) amiodarone  held January 2026 during admission for cellulitis and C. difficile - Now with prolonged persistent atrial fibrillation.  Suspect elevated rates due to COVID infection.  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  Would recommend restarting Lasix  and spironolactone  today.  If he is still in the hospital tomorrow and blood pressures/renal function are stable, then can restart Entresto .  If he discharges today, then this can be done in the outpatient setting. -History of recurrent UTIs so poor candidate for SGLT2 inhibitor therapy   AKI -Serum creatinine  1.89 on arrival now improved to his baseline 0.89 -Entresto , spironolactone , furosemide  were initially held; resume as above  Cardiology will sign off now.  Please feel free to reach out with any further questions.  Signed, Caron Poser, MD  11/11/2024, 8:16 AM    "

## 2024-11-11 NOTE — Consult Note (Signed)
 PHARMACY - ANTICOAGULATION CONSULT NOTE  Pharmacy Consult for Warfarin Indication: atrial fibrillation  Allergies[1]  Patient Measurements: Height: 6' 3 (190.5 cm) Weight: (!) 142.3 kg (313 lb 11.4 oz) IBW/kg (Calculated) : 84.5 HEPARIN DW (KG): 120  Labs: Recent Labs    11/09/24 0327 11/10/24 0435 11/11/24 0528  HGB 12.3*  --   --   HCT 37.4*  --   --   PLT 100*  --   --   LABPROT 26.0* 27.2* 27.4*  INR 2.3* 2.4* 2.4*    Estimated Creatinine Clearance: 119.2 mL/min (by C-G formula based on SCr of 0.89 mg/dL).   Medical History: Past Medical History:  Diagnosis Date   Arthritis    a. chronic joint pain   B12 deficiency    Chronic systolic CHF (congestive heart failure) (HCC)    a. 2013 Echo: EF of 50-55%, normal right ventricular systolic pressure, normal left atrium; b. 04/2015 Echo: EF 40-45%, inadequate for LV wall motion, mild MR, LA severely dilated @ 52 mm, PASP nl; c. 10/2024 Echo: EF 35-40%, glob HK, mod-sev dil LV. Mild conc LVH. Nl RV fxn. Mildly dil LA. Mild MR. AoV sclerosis w/o stenosis.   Complication of anesthesia    Dysrhythmia    Erythema migrans (Lyme disease) 04/08/2015   History of DVT (deep vein thrombosis) 2004   s/p bariatric surgery   History of stress test    a. there was no ST segment deviation noted during stress,    There is a small defect of moderate severity present in the apex location, suggestive of apical ischemia, intermediate risk, calculated EF 21% but visually appears to be 35-40%   Hypertension    Hypothyroidism    Neuropathy    Obesity    OSA (obstructive sleep apnea)    a. not compliant with CPAP   PAF (paroxysmal atrial fibrillation) (HCC)    a. CHADSVASc at least 2 (HTN and vascular disease); b. on Eliquis     Pre-diabetes    Medications:  Warfarin 2.5 mg daily prior to admission with last dose 11/03/24 at 0900. This was a new discharge dose on 10/16/24. TWD = 17.5 mg   Regimen prior to 10/11/24 - 10/16/24 admission was 7.5 mg  on MoFr and 5 mg EOD = TWD 40 mg  Assessment: 69 y/o M presenting from SNF with fall and AMS. Upon admission, found with AKI. PMH significant for osteoarthritis, vitamin B12 deficiency, chronic systolic CHF, lyme disease, essential hypertension, hypothyroidism, peripheral neuropathy, OSA, falls and paroxysmal atrial fibrillation. Patient also has recent history of COVID (tested positive on 1/16). Currently on warfarin for Afib with CHADSVASc 3 (age, CHF, HTN). Pt has allergies listed to apixaban  and rivaorxaban and cannot afford dabigatran . INR was subtherapeutic on admission with new dose of warfarin 2.5 mg daily, but unsure if pt was compliant. Pharmacy has been consulted for warfarin dosing and monitoring.  DDI: venlafaxine  (increase risk of bleeding), levothyroxine  (increase response to warfarin), rosuvastatin  (increase INR), acetaminophen  (increase risk of bleeding).   Date INR Warfarin Dose  1/25 1.7 4 mg  1/26 2.1 2.5 mg   1/27 2.3 2.5 mg  1/28 2.3 2.5 mg   1/29 2.2 2.5 mg   1/30 2.3 2.5 mg  1/31 2.4 2.5 mg  2/01 2.4 2.5 mg     Goal of Therapy:  INR 2-3   Plan:  -- INR is within therapeutic range -- Will order Warfarin 2.5 mg PO x 1, which is patient's normal home dose -- PLTs  were downtrending, now recovering (104 >> 98 >> 95 >> 100). Hgb is also returning to baseline (12.7 >> 12.1 >> 11.5 >> 12.3).  -- AST (44 >> 51 >> 41 >> 27) and ALT (85 >> 88 >> 69 >> 52) elevated  upon admission but now trending down. -- Per RN, no signs of external bleeding. Urine also yellow and clear. Will continue to monitor  -- Daily INR and CBC at least every 3 days while admitted -- Patient is currently therapeutic on the current regimen, and pharmacy will continue to monitor throughout the hospital stay.    Adriana Bolster, PharmD, BCPS 11/11/2024 8:12 AM           [1]  Allergies Allergen Reactions   Eliquis  [Apixaban ] Itching    Red spots and severe itching   Xarelto [Rivaroxaban]  Hives    Reaction in 2013

## 2024-11-12 ENCOUNTER — Telehealth: Payer: Self-pay | Admitting: *Deleted

## 2024-11-12 NOTE — Patient Instructions (Signed)
 Visit Information  Thank you for taking time to visit with me today. Please don't hesitate to contact me if I can be of assistance to you before our next scheduled telephone appointment.  Our next appointment is by telephone on 11/20/2024 at 11:00 AM  Following is a copy of your care plan:   Goals Addressed             This Visit's Progress    VBCI Transitions of Care (TOC) Care Plan       Problems:  Recent Hospitalization for treatment of Acute Kidney Injury Knowledge Deficit Related to Acute Kidney Injury  Goal:  Over the next 30 days, the patient will not experience hospital readmission  Interventions:  Transitions of Care: Durable Medical Equipment (DME) reviewed with patient/caregiver, orders reviewed, delivery confirmed, and education provided Educate on Acute Kidney Injury and explained the warning signs and when to seek immediate medical attention Patient will maintain kidney function and avoid readmission over the next 30 days Review hospital discharge summary Confirm all follow up appointments with primary, cardiologist and heart failure clinic Medication management review of all medications and stress the importance of obtaining a missing medication Aldactone  and to start immediately. RNCM will also communicate with Dr. Marylynn on the absence of the medication since discharge. Screening for depression and SDOH completed with no needs to address at this time  Patient Self Care Activities:  Attend all scheduled provider appointments Call pharmacy for medication refills 3-7 days in advance of running out of medications Call provider office for new concerns or questions  Notify RN Care Manager of TOC call rescheduling needs Participate in Transition of Care Program/Attend TOC scheduled calls Perform all self care activities independently  Take medications as prescribed   Patient/spouse (Johnathan [name]) states she will follow up with primary provider concerning the missing  medication for Aldactone .    Patient/Spouse DPR Johnathan [name] verbalized an understanding (teach-back).  Plan:  Telephone follow up appointment with care management team member scheduled for:  11/20/24 @ 11:00 AM The patient has been provided with contact information for the care management team and has been advised to call with any health related questions or concerns.  The patient will call his Cardiologist (Dr. Caron)  as advised to scheduled a hospital follow up visit.         Care plan and visit instructions communicated with the patient verbally today. Patient agrees to receive a copy in MyChart. Active MyChart status and patient understanding of how to access instructions and care plan via MyChart confirmed with patient.     Telephone follow up appointment with care management team member scheduled for: The patient has been provided with contact information for the care management team and has been advised to call with any health related questions or concerns.  The patient will call Cardiologist* as advised to schedule a post-hospital follow up appointment.   Please call the care guide team at [phone] if you need to cancel or reschedule your appointment.   Please call the Suicide and Crisis Lifeline: 988 call the [place]  National Suicide Prevention Lifeline: (304)553-3514 or TTY: 309 334 2103 TTY 430-506-4474) to talk to a trained counselor call 1-800-273-TALK (toll free, 24 hour hotline) if you are experiencing a Mental Health or Behavioral Health Crisis or need someone to talk to.   Olam Ku, RN, BSN [place]  [facility], [facility] RN Care Manager Direct Dial: [phone]  Fax: [phone]

## 2024-11-12 NOTE — Transitions of Care (Post Inpatient/ED Visit) (Signed)
 "  11/12/2024  Name: Johnathan [name] MRN: [id number] DOB: [birth year]  Today's TOC FU Call Status: Today's TOC FU Call Status:: Successful TOC FU Call Completed TOC FU Call Complete Date: 11/12/24  Patient's Name and Date of Birth confirmed. Name, DOB (DPR also spouse engaged with today's asssessment (Johnathan [name]))  Transition Care Management Follow-up Telephone Call Date of Discharge: 11/11/24 Discharge Facility[facility]) Type of Discharge: Inpatient Admission Primary Inpatient Discharge Diagnosis:: Acute Kidney Injury How have you been since you were released from the hospital?: Better Any questions or concerns?: No  Items Reviewed: Did you receive and understand the discharge instructions provided?: Yes Medications obtained,verified, and reconciled?: Yes (Medications Reviewed) Any new allergies since your discharge?: No Dietary orders reviewed?: Yes Type of Diet Ordered:: Heart Healthy Do you have support at home?: Yes People in Home [RPT]: spouse Name of Support/Comfort Primary Source: Johnathan [name] (spouse)  Medications Reviewed Today: Medications Reviewed Today     Reviewed by Johnathan Olam BIRCH, RN (Registered Nurse) on 11/12/24 at 1117  Med List Status: <None>   Medication Order Taking? Sig Documenting Provider Last Dose Status Informant  acetaminophen  (TYLENOL ) 325 MG tablet [id number] Yes Take 2 tablets (650 mg total) by mouth every 6 (six) hours as needed for mild pain (pain score 1-3) or fever (or Fever >/= 101). Johnathan Ade, [name]  Active Nursing Home Medication Administration Guide (MAG)  buPROPion  (WELLBUTRIN  XL) 300 MG 24 hr tablet [id number] Yes Take 1 tablet (300 mg total) by mouth daily. Johnathan Verneita CROME, [name]  Active Nursing Home Medication Administration Guide (MAG)  cetirizine (ZYRTEC) 10 MG tablet [id number] Yes Take 10 mg by mouth daily. Provider, Historical, [name]  Active Nursing Home Medication Administration Guide (MAG)  furosemide  (LASIX ) 20  MG tablet [id number] Yes Take 1 tablet (20 mg total) by mouth daily. Johnathan Ade, [name]  Active Nursing Home Medication Administration Guide (MAG)  gabapentin  (NEURONTIN ) 300 MG capsule [id number] Yes Take 1 capsule (300 mg total) by mouth 2 (two) times daily. TAKE 1 CAPSULE BY MOUTH TWO TIMES A DAY [name], Richard, [name]  Active Nursing Home Medication Administration Guide (MAG)  HYDROcodone -acetaminophen  (NORCO) 10-325 MG tablet [id number] Yes Take 1 tablet by mouth every 8 (eight) hours as needed (Max dose 3 X daily). Provider, Historical, [name]  Active   levothyroxine  (SYNTHROID ) 25 MCG tablet [id number] Yes Take 1 tablet (25 mcg total) by mouth daily before breakfast. Johnathan Verneita CROME, [name]  Active Nursing Home Medication Administration Guide (MAG)  metoprolol  succinate (TOPROL -XL) 100 MG 24 hr tablet [id number] Yes Take 1 tablet (100 mg total) by mouth daily. Decreased from twice daily. Johnathan [place], [name]  Active   Multiple Vitamin (MULTIVITAMIN) tablet [id number] Yes Take 1 tablet by mouth daily. Provider, Historical, [name]  Active Nursing Home Medication Administration Guide (MAG)  naloxone [facility]) nasal spray 4 mg/0.1 mL [id number] Yes Place 1 spray into the nose 3 (three) times daily as needed. Provider, Historical, [name]  Active Nursing Home Medication Administration Guide (MAG)  rosuvastatin  (CRESTOR ) 10 MG tablet [id number] Yes TAKE 1 TABLET BY MOUTH ONCE DAILY AT  2PM  Patient taking differently: Take 10 mg by mouth daily.   Johnathan Verneita CROME, [name]  Active Nursing Home Medication Administration Guide (MAG)  sacubitril -valsartan  (ENTRESTO ) 24-26 MG [id number]  Take 1 tablet by mouth 2 (two) times daily. Johnathan Ade, [name]  Active Nursing Home Medication Administration Guide (MAG)  spironolactone  (ALDACTONE ) 25 MG tablet [id number]  Take 1 tablet (25 mg total) by mouth daily.  Patient not taking: Reported on 11/12/2024   Johnathan Ade, [name]  Active Nursing Home Medication Administration Guide (MAG)  venlafaxine  XR  (EFFEXOR -XR) 150 MG 24 hr capsule [id number] Yes Take 2 capsules (300 mg total) by mouth daily with breakfast. Johnathan Verneita CROME, [name]  Active Nursing Home Medication Administration Guide (MAG)  vitamin B-12 (CYANOCOBALAMIN ) 500 MCG tablet [id number] Yes Take 500 mcg by mouth daily. Provider, Historical, [name]  Active Nursing Home Medication Administration Guide (MAG)  vitamin E  400 UNIT capsule [id number] Yes Take 400 Units by mouth daily. Provider, Historical, [name]  Active Nursing Home Medication Administration Guide (MAG)  warfarin (COUMADIN ) 2.5 MG tablet [id number] Yes Take 1 tablet (2.5 mg total) by mouth daily. Johnathan Ade, [name]  Active Nursing Home Medication Administration Guide (MAG)  Med List Note Johnathan [name], [place] 03/29/17 1648): **[facility]**            Home Care and Equipment/Supplies: Were Home Health Services Ordered?: Yes Name of Home Health Agency:: [facility] [phone] Has Agency set up a time to come to your home?: Yes First Home Health Visit Date: 11/18/24 Any new equipment or medical supplies ordered?: Yes Name of Medical supply agency?: [facility] Were you able to get the equipment/medical supplies?: Yes Do you have any questions related to the use of the equipment/supplies?: No  Functional Questionnaire: Do you need assistance with bathing/showering or dressing?: No Do you need assistance with meal preparation?: Yes (Spouse assist with this task) Do you need assistance with eating?: No Do you have difficulty maintaining continence: No Do you need assistance with getting out of bed/getting out of a chair/moving?: No Do you have difficulty managing or taking your medications?: Yes (Spouse assist with this task)  Follow up appointments reviewed: PCP Follow-up appointment confirmed?: Yes Date of PCP follow-up appointment?: 11/19/24 Follow-up Provider: Dr. Verneita Johnathan Specialist [facility] Follow-up appointment confirmed?: Yes Date of Specialist follow-up  appointment?: 11/22/24 (Spouse reports she will call Cardiologist Dr. Caron and schedule hospital follow up as noted on AVS.  Wife states she has the contact number) Follow-Up Specialty Provider:: [facility] (Spouse indicates this appointment was changed from 2/4 to 2/12 due to his recent hospitalization) Do you need transportation to your follow-up appointment?: No Do you understand care options if your condition(s) worsen?: Yes-patient verbalized understanding  SDOH Interventions Today    Flowsheet Row Most Recent Value  SDOH Interventions   Food Insecurity Interventions Intervention Not Indicated  Housing Interventions Intervention Not Indicated  Transportation Interventions Intervention Not Indicated  Utilities Interventions Intervention Not Indicated   Discussed and offered 30 day TOC program.  Patient has agreed to the 30 day Transition of care program starting today.  The patient has been provided with contact information for the care management team and has been advised to call with any health -related questions or concerns.  The patient verbalized understanding with current plan of care.  The patient is directed to their insurance card regarding availability of benefits coverage.    Olam Ku, RN, BSN [place]  [facility], [facility] RN Care Manager Direct Dial: [phone]  Fax: [phone]   "

## 2024-11-14 ENCOUNTER — Ambulatory Visit: Payer: Self-pay | Admitting: Family

## 2024-11-15 ENCOUNTER — Inpatient Hospital Stay: Admitting: Internal Medicine

## 2024-11-19 ENCOUNTER — Inpatient Hospital Stay: Admitting: Internal Medicine

## 2024-11-20 ENCOUNTER — Ambulatory Visit

## 2024-11-22 ENCOUNTER — Ambulatory Visit: Payer: Self-pay | Admitting: Family

## 2025-01-07 ENCOUNTER — Ambulatory Visit: Admitting: Internal Medicine

## 2025-09-11 ENCOUNTER — Ambulatory Visit
